# Patient Record
Sex: Female | Born: 1935 | Race: White | Hispanic: No | Marital: Married | Smoking: Never smoker
Health system: Southern US, Community
[De-identification: ages and names within clinical notes are randomized; demographics above are authoritative.]

## PROBLEM LIST (undated history)

## (undated) DIAGNOSIS — J449 Chronic obstructive pulmonary disease, unspecified: Secondary | ICD-10-CM

## (undated) DIAGNOSIS — E039 Hypothyroidism, unspecified: Secondary | ICD-10-CM

## (undated) DIAGNOSIS — F419 Anxiety disorder, unspecified: Secondary | ICD-10-CM

## (undated) DIAGNOSIS — R42 Dizziness and giddiness: Secondary | ICD-10-CM

## (undated) DIAGNOSIS — J189 Pneumonia, unspecified organism: Secondary | ICD-10-CM

## (undated) DIAGNOSIS — I2510 Atherosclerotic heart disease of native coronary artery without angina pectoris: Secondary | ICD-10-CM

## (undated) DIAGNOSIS — R7303 Prediabetes: Secondary | ICD-10-CM

## (undated) DIAGNOSIS — N289 Disorder of kidney and ureter, unspecified: Secondary | ICD-10-CM

## (undated) DIAGNOSIS — Z940 Kidney transplant status: Secondary | ICD-10-CM

## (undated) DIAGNOSIS — K219 Gastro-esophageal reflux disease without esophagitis: Secondary | ICD-10-CM

## (undated) DIAGNOSIS — R269 Unspecified abnormalities of gait and mobility: Secondary | ICD-10-CM

## (undated) DIAGNOSIS — F329 Major depressive disorder, single episode, unspecified: Secondary | ICD-10-CM

## (undated) DIAGNOSIS — I82409 Acute embolism and thrombosis of unspecified deep veins of unspecified lower extremity: Secondary | ICD-10-CM

## (undated) DIAGNOSIS — K5792 Diverticulitis of intestine, part unspecified, without perforation or abscess without bleeding: Secondary | ICD-10-CM

## (undated) DIAGNOSIS — J45909 Unspecified asthma, uncomplicated: Secondary | ICD-10-CM

## (undated) DIAGNOSIS — IMO0001 Reserved for inherently not codable concepts without codable children: Secondary | ICD-10-CM

## (undated) DIAGNOSIS — Z889 Allergy status to unspecified drugs, medicaments and biological substances status: Secondary | ICD-10-CM

## (undated) DIAGNOSIS — F32A Depression, unspecified: Secondary | ICD-10-CM

## (undated) DIAGNOSIS — E079 Disorder of thyroid, unspecified: Secondary | ICD-10-CM

## (undated) DIAGNOSIS — M1990 Unspecified osteoarthritis, unspecified site: Secondary | ICD-10-CM

## (undated) DIAGNOSIS — I10 Essential (primary) hypertension: Secondary | ICD-10-CM

## (undated) HISTORY — DX: Gastro-esophageal reflux disease without esophagitis: K21.9

## (undated) HISTORY — DX: Hypothyroidism, unspecified: E03.9

## (undated) HISTORY — DX: Dizziness and giddiness: R42

## (undated) HISTORY — PX: NEPHRECTOMY TRANSPLANTED ORGAN: SUR880

## (undated) HISTORY — DX: Depression, unspecified: F32.A

## (undated) HISTORY — DX: Reserved for inherently not codable concepts without codable children: IMO0001

## (undated) HISTORY — DX: Diverticulitis of intestine, part unspecified, without perforation or abscess without bleeding: K57.92

## (undated) HISTORY — DX: Anxiety disorder, unspecified: F41.9

## (undated) HISTORY — PX: COLONOSCOPY: SHX174

## (undated) HISTORY — DX: Major depressive disorder, single episode, unspecified: F32.9

## (undated) HISTORY — DX: Unspecified abnormalities of gait and mobility: R26.9

## (undated) HISTORY — PX: TUMOR REMOVAL: SHX12

## (undated) HISTORY — DX: Chronic obstructive pulmonary disease, unspecified: J44.9

---

## 1998-03-10 ENCOUNTER — Encounter: Admission: RE | Admit: 1998-03-10 | Discharge: 1998-06-08 | Payer: Self-pay | Admitting: *Deleted

## 1998-09-21 ENCOUNTER — Ambulatory Visit (HOSPITAL_COMMUNITY): Admission: RE | Admit: 1998-09-21 | Discharge: 1998-09-21 | Payer: Self-pay | Admitting: Gastroenterology

## 1998-09-21 ENCOUNTER — Encounter: Payer: Self-pay | Admitting: Gastroenterology

## 1999-02-20 ENCOUNTER — Other Ambulatory Visit: Admission: RE | Admit: 1999-02-20 | Discharge: 1999-02-20 | Payer: Self-pay | Admitting: Obstetrics and Gynecology

## 2000-01-23 ENCOUNTER — Ambulatory Visit (HOSPITAL_COMMUNITY): Admission: RE | Admit: 2000-01-23 | Discharge: 2000-01-23 | Payer: Self-pay | Admitting: Obstetrics and Gynecology

## 2000-01-23 ENCOUNTER — Encounter (INDEPENDENT_AMBULATORY_CARE_PROVIDER_SITE_OTHER): Payer: Self-pay | Admitting: Specialist

## 2000-02-27 ENCOUNTER — Encounter: Admission: RE | Admit: 2000-02-27 | Discharge: 2000-02-27 | Payer: Self-pay | Admitting: Geriatric Medicine

## 2000-02-27 ENCOUNTER — Encounter: Payer: Self-pay | Admitting: Geriatric Medicine

## 2000-07-10 ENCOUNTER — Other Ambulatory Visit: Admission: RE | Admit: 2000-07-10 | Discharge: 2000-07-10 | Payer: Self-pay | Admitting: Obstetrics and Gynecology

## 2000-07-24 ENCOUNTER — Encounter: Payer: Self-pay | Admitting: Obstetrics and Gynecology

## 2000-07-24 ENCOUNTER — Encounter: Admission: RE | Admit: 2000-07-24 | Discharge: 2000-07-24 | Payer: Self-pay | Admitting: Obstetrics and Gynecology

## 2000-11-29 ENCOUNTER — Ambulatory Visit: Admission: RE | Admit: 2000-11-29 | Discharge: 2000-11-29 | Payer: Self-pay | Admitting: Neurosurgery

## 2000-11-29 ENCOUNTER — Encounter: Payer: Self-pay | Admitting: Neurosurgery

## 2000-12-18 ENCOUNTER — Encounter: Payer: Self-pay | Admitting: Neurosurgery

## 2000-12-20 ENCOUNTER — Inpatient Hospital Stay (HOSPITAL_COMMUNITY): Admission: RE | Admit: 2000-12-20 | Discharge: 2000-12-26 | Payer: Self-pay | Admitting: Neurosurgery

## 2000-12-20 ENCOUNTER — Encounter (INDEPENDENT_AMBULATORY_CARE_PROVIDER_SITE_OTHER): Payer: Self-pay | Admitting: *Deleted

## 2000-12-24 ENCOUNTER — Encounter: Payer: Self-pay | Admitting: Neurosurgery

## 2000-12-30 ENCOUNTER — Encounter: Admission: RE | Admit: 2000-12-30 | Discharge: 2001-03-30 | Payer: Self-pay | Admitting: Neurosurgery

## 2001-01-24 ENCOUNTER — Ambulatory Visit (HOSPITAL_COMMUNITY): Admission: RE | Admit: 2001-01-24 | Discharge: 2001-01-24 | Payer: Self-pay | Admitting: Neurosurgery

## 2001-01-24 ENCOUNTER — Encounter: Payer: Self-pay | Admitting: Neurosurgery

## 2001-03-07 ENCOUNTER — Other Ambulatory Visit: Admission: RE | Admit: 2001-03-07 | Discharge: 2001-03-07 | Payer: Self-pay | Admitting: Obstetrics and Gynecology

## 2001-04-01 ENCOUNTER — Encounter: Payer: Self-pay | Admitting: Geriatric Medicine

## 2001-04-01 ENCOUNTER — Encounter: Admission: RE | Admit: 2001-04-01 | Discharge: 2001-04-01 | Payer: Self-pay | Admitting: Geriatric Medicine

## 2001-05-05 ENCOUNTER — Encounter: Admission: RE | Admit: 2001-05-05 | Discharge: 2001-05-05 | Payer: Self-pay | Admitting: Neurosurgery

## 2001-05-05 ENCOUNTER — Encounter: Payer: Self-pay | Admitting: Neurosurgery

## 2001-06-19 ENCOUNTER — Encounter
Admission: RE | Admit: 2001-06-19 | Discharge: 2001-09-17 | Payer: Self-pay | Admitting: Physical Medicine and Rehabilitation

## 2001-08-21 ENCOUNTER — Encounter: Admission: RE | Admit: 2001-08-21 | Discharge: 2001-08-21 | Payer: Self-pay | Admitting: Obstetrics and Gynecology

## 2001-08-21 ENCOUNTER — Encounter: Payer: Self-pay | Admitting: Obstetrics and Gynecology

## 2001-08-24 ENCOUNTER — Ambulatory Visit (HOSPITAL_COMMUNITY): Admission: RE | Admit: 2001-08-24 | Discharge: 2001-08-24 | Payer: Self-pay | Admitting: Neurosurgery

## 2001-08-24 ENCOUNTER — Encounter: Payer: Self-pay | Admitting: Neurosurgery

## 2001-09-30 ENCOUNTER — Encounter
Admission: RE | Admit: 2001-09-30 | Discharge: 2001-10-02 | Payer: Self-pay | Admitting: Physical Medicine and Rehabilitation

## 2002-05-05 ENCOUNTER — Other Ambulatory Visit: Admission: RE | Admit: 2002-05-05 | Discharge: 2002-05-05 | Payer: Self-pay | Admitting: Gynecology

## 2002-08-21 ENCOUNTER — Encounter: Admission: RE | Admit: 2002-08-21 | Discharge: 2002-08-21 | Payer: Self-pay | Admitting: Gynecology

## 2002-08-21 ENCOUNTER — Encounter: Payer: Self-pay | Admitting: Gynecology

## 2002-08-24 ENCOUNTER — Encounter: Payer: Self-pay | Admitting: Neurosurgery

## 2002-08-24 ENCOUNTER — Ambulatory Visit (HOSPITAL_COMMUNITY): Admission: RE | Admit: 2002-08-24 | Discharge: 2002-08-24 | Payer: Self-pay | Admitting: Neurosurgery

## 2002-08-24 ENCOUNTER — Ambulatory Visit (HOSPITAL_BASED_OUTPATIENT_CLINIC_OR_DEPARTMENT_OTHER): Admission: RE | Admit: 2002-08-24 | Discharge: 2002-08-24 | Payer: Self-pay | Admitting: Gynecology

## 2002-09-08 ENCOUNTER — Encounter: Admission: RE | Admit: 2002-09-08 | Discharge: 2002-09-08 | Payer: Self-pay | Admitting: Gynecology

## 2002-09-08 ENCOUNTER — Encounter: Payer: Self-pay | Admitting: Gynecology

## 2002-09-24 ENCOUNTER — Encounter: Admission: RE | Admit: 2002-09-24 | Discharge: 2002-09-24 | Payer: Self-pay | Admitting: Gynecology

## 2002-09-24 ENCOUNTER — Encounter: Payer: Self-pay | Admitting: Gynecology

## 2002-09-25 ENCOUNTER — Encounter: Payer: Self-pay | Admitting: Neurosurgery

## 2002-09-25 ENCOUNTER — Ambulatory Visit (HOSPITAL_COMMUNITY): Admission: RE | Admit: 2002-09-25 | Discharge: 2002-09-25 | Payer: Self-pay | Admitting: Gynecology

## 2003-05-11 ENCOUNTER — Other Ambulatory Visit: Admission: RE | Admit: 2003-05-11 | Discharge: 2003-05-11 | Payer: Self-pay | Admitting: Gynecology

## 2003-09-27 ENCOUNTER — Encounter: Admission: RE | Admit: 2003-09-27 | Discharge: 2003-09-27 | Payer: Self-pay | Admitting: Gastroenterology

## 2003-10-25 ENCOUNTER — Encounter (INDEPENDENT_AMBULATORY_CARE_PROVIDER_SITE_OTHER): Payer: Self-pay | Admitting: *Deleted

## 2003-10-25 ENCOUNTER — Inpatient Hospital Stay (HOSPITAL_COMMUNITY): Admission: AD | Admit: 2003-10-25 | Discharge: 2003-11-30 | Payer: Self-pay | Admitting: Endocrinology

## 2003-12-16 ENCOUNTER — Encounter: Admission: RE | Admit: 2003-12-16 | Discharge: 2003-12-16 | Payer: Self-pay | Admitting: Nephrology

## 2004-01-11 ENCOUNTER — Inpatient Hospital Stay (HOSPITAL_COMMUNITY): Admission: EM | Admit: 2004-01-11 | Discharge: 2004-01-13 | Payer: Self-pay | Admitting: Emergency Medicine

## 2004-04-13 ENCOUNTER — Ambulatory Visit (HOSPITAL_COMMUNITY): Admission: RE | Admit: 2004-04-13 | Discharge: 2004-04-13 | Payer: Self-pay | Admitting: *Deleted

## 2004-04-20 ENCOUNTER — Encounter: Payer: Self-pay | Admitting: Vascular Surgery

## 2004-04-20 ENCOUNTER — Ambulatory Visit (HOSPITAL_COMMUNITY): Admission: RE | Admit: 2004-04-20 | Discharge: 2004-04-20 | Payer: Self-pay | Admitting: *Deleted

## 2004-06-23 ENCOUNTER — Ambulatory Visit (HOSPITAL_COMMUNITY): Admission: RE | Admit: 2004-06-23 | Discharge: 2004-06-23 | Payer: Self-pay | Admitting: Vascular Surgery

## 2004-07-06 ENCOUNTER — Ambulatory Visit (HOSPITAL_COMMUNITY): Admission: RE | Admit: 2004-07-06 | Discharge: 2004-07-06 | Payer: Self-pay | Admitting: Vascular Surgery

## 2004-08-14 ENCOUNTER — Ambulatory Visit (HOSPITAL_COMMUNITY): Admission: RE | Admit: 2004-08-14 | Discharge: 2004-08-14 | Payer: Self-pay | Admitting: Vascular Surgery

## 2004-08-30 ENCOUNTER — Other Ambulatory Visit: Admission: RE | Admit: 2004-08-30 | Discharge: 2004-08-30 | Payer: Self-pay | Admitting: Gynecology

## 2004-09-14 ENCOUNTER — Ambulatory Visit (HOSPITAL_COMMUNITY): Admission: RE | Admit: 2004-09-14 | Discharge: 2004-09-14 | Payer: Self-pay | Admitting: Gynecology

## 2004-09-28 ENCOUNTER — Ambulatory Visit (HOSPITAL_COMMUNITY): Admission: RE | Admit: 2004-09-28 | Discharge: 2004-09-28 | Payer: Self-pay | Admitting: Gastroenterology

## 2004-09-28 ENCOUNTER — Encounter (INDEPENDENT_AMBULATORY_CARE_PROVIDER_SITE_OTHER): Payer: Self-pay | Admitting: *Deleted

## 2004-12-20 ENCOUNTER — Other Ambulatory Visit: Admission: RE | Admit: 2004-12-20 | Discharge: 2004-12-20 | Payer: Self-pay | Admitting: Obstetrics and Gynecology

## 2005-01-15 ENCOUNTER — Encounter (INDEPENDENT_AMBULATORY_CARE_PROVIDER_SITE_OTHER): Payer: Self-pay | Admitting: Specialist

## 2005-01-15 ENCOUNTER — Ambulatory Visit (HOSPITAL_COMMUNITY): Admission: RE | Admit: 2005-01-15 | Discharge: 2005-01-15 | Payer: Self-pay | Admitting: Obstetrics and Gynecology

## 2005-05-15 ENCOUNTER — Encounter: Admission: RE | Admit: 2005-05-15 | Discharge: 2005-05-15 | Payer: Self-pay | Admitting: Gastroenterology

## 2005-05-15 ENCOUNTER — Emergency Department (HOSPITAL_COMMUNITY): Admission: EM | Admit: 2005-05-15 | Discharge: 2005-05-15 | Payer: Self-pay | Admitting: Emergency Medicine

## 2005-05-17 ENCOUNTER — Ambulatory Visit (HOSPITAL_COMMUNITY): Admission: RE | Admit: 2005-05-17 | Discharge: 2005-05-17 | Payer: Self-pay | Admitting: Family Medicine

## 2005-06-07 ENCOUNTER — Encounter: Admission: RE | Admit: 2005-06-07 | Discharge: 2005-06-27 | Payer: Self-pay | Admitting: Specialist

## 2005-06-28 ENCOUNTER — Encounter: Admission: RE | Admit: 2005-06-28 | Discharge: 2005-08-06 | Payer: Self-pay | Admitting: Specialist

## 2005-08-07 ENCOUNTER — Encounter: Admission: RE | Admit: 2005-08-07 | Discharge: 2005-09-10 | Payer: Self-pay | Admitting: Specialist

## 2005-09-11 ENCOUNTER — Encounter: Admission: RE | Admit: 2005-09-11 | Discharge: 2005-12-10 | Payer: Self-pay | Admitting: Specialist

## 2005-10-09 ENCOUNTER — Ambulatory Visit (HOSPITAL_COMMUNITY): Admission: RE | Admit: 2005-10-09 | Discharge: 2005-10-09 | Payer: Self-pay | Admitting: Nephrology

## 2005-11-21 ENCOUNTER — Ambulatory Visit (HOSPITAL_COMMUNITY): Admission: RE | Admit: 2005-11-21 | Discharge: 2005-11-21 | Payer: Self-pay | Admitting: Vascular Surgery

## 2006-02-05 ENCOUNTER — Other Ambulatory Visit: Admission: RE | Admit: 2006-02-05 | Discharge: 2006-02-05 | Payer: Self-pay | Admitting: Obstetrics and Gynecology

## 2006-02-21 ENCOUNTER — Ambulatory Visit (HOSPITAL_COMMUNITY): Admission: RE | Admit: 2006-02-21 | Discharge: 2006-02-21 | Payer: Self-pay | Admitting: Nephrology

## 2006-02-26 ENCOUNTER — Ambulatory Visit (HOSPITAL_COMMUNITY): Admission: RE | Admit: 2006-02-26 | Discharge: 2006-02-26 | Payer: Self-pay | Admitting: Obstetrics and Gynecology

## 2006-03-18 ENCOUNTER — Ambulatory Visit: Payer: Self-pay | Admitting: Pulmonary Disease

## 2006-04-02 ENCOUNTER — Encounter: Admission: RE | Admit: 2006-04-02 | Discharge: 2006-04-02 | Payer: Self-pay | Admitting: Nephrology

## 2006-05-23 ENCOUNTER — Inpatient Hospital Stay (HOSPITAL_COMMUNITY): Admission: EM | Admit: 2006-05-23 | Discharge: 2006-05-24 | Payer: Self-pay | Admitting: Emergency Medicine

## 2006-05-27 ENCOUNTER — Emergency Department (HOSPITAL_COMMUNITY): Admission: EM | Admit: 2006-05-27 | Discharge: 2006-05-27 | Payer: Self-pay | Admitting: Emergency Medicine

## 2006-10-22 ENCOUNTER — Ambulatory Visit (HOSPITAL_COMMUNITY): Admission: RE | Admit: 2006-10-22 | Discharge: 2006-10-22 | Payer: Self-pay | Admitting: Nephrology

## 2007-01-19 ENCOUNTER — Emergency Department (HOSPITAL_COMMUNITY): Admission: EM | Admit: 2007-01-19 | Discharge: 2007-01-19 | Payer: Self-pay | Admitting: Emergency Medicine

## 2007-01-22 ENCOUNTER — Ambulatory Visit: Payer: Self-pay | Admitting: Vascular Surgery

## 2007-01-22 ENCOUNTER — Ambulatory Visit (HOSPITAL_COMMUNITY): Admission: RE | Admit: 2007-01-22 | Discharge: 2007-01-22 | Payer: Self-pay | Admitting: Vascular Surgery

## 2007-02-18 ENCOUNTER — Other Ambulatory Visit: Admission: RE | Admit: 2007-02-18 | Discharge: 2007-02-18 | Payer: Self-pay | Admitting: Obstetrics and Gynecology

## 2007-02-19 ENCOUNTER — Ambulatory Visit (HOSPITAL_COMMUNITY): Admission: RE | Admit: 2007-02-19 | Discharge: 2007-02-19 | Payer: Self-pay | Admitting: Vascular Surgery

## 2007-02-20 ENCOUNTER — Ambulatory Visit (HOSPITAL_COMMUNITY): Admission: RE | Admit: 2007-02-20 | Discharge: 2007-02-20 | Payer: Self-pay | Admitting: Vascular Surgery

## 2007-03-11 ENCOUNTER — Ambulatory Visit (HOSPITAL_COMMUNITY): Admission: RE | Admit: 2007-03-11 | Discharge: 2007-03-11 | Payer: Self-pay | Admitting: Nephrology

## 2007-04-15 ENCOUNTER — Encounter: Admission: RE | Admit: 2007-04-15 | Discharge: 2007-04-15 | Payer: Self-pay | Admitting: Gastroenterology

## 2007-04-23 ENCOUNTER — Ambulatory Visit: Payer: Self-pay | Admitting: Vascular Surgery

## 2007-05-06 ENCOUNTER — Ambulatory Visit: Payer: Self-pay | Admitting: Vascular Surgery

## 2007-05-06 ENCOUNTER — Ambulatory Visit (HOSPITAL_COMMUNITY): Admission: RE | Admit: 2007-05-06 | Discharge: 2007-05-06 | Payer: Self-pay | Admitting: Vascular Surgery

## 2007-05-14 ENCOUNTER — Ambulatory Visit: Payer: Self-pay | Admitting: Vascular Surgery

## 2007-06-17 ENCOUNTER — Ambulatory Visit (HOSPITAL_COMMUNITY): Admission: RE | Admit: 2007-06-17 | Discharge: 2007-06-17 | Payer: Self-pay | Admitting: Obstetrics and Gynecology

## 2007-06-17 ENCOUNTER — Encounter (INDEPENDENT_AMBULATORY_CARE_PROVIDER_SITE_OTHER): Payer: Self-pay | Admitting: Obstetrics and Gynecology

## 2007-06-26 ENCOUNTER — Ambulatory Visit: Payer: Self-pay | Admitting: Surgery

## 2007-06-26 ENCOUNTER — Ambulatory Visit (HOSPITAL_COMMUNITY): Admission: RE | Admit: 2007-06-26 | Discharge: 2007-06-26 | Payer: Self-pay | Admitting: Vascular Surgery

## 2007-06-28 ENCOUNTER — Ambulatory Visit (HOSPITAL_COMMUNITY): Admission: RE | Admit: 2007-06-28 | Discharge: 2007-06-28 | Payer: Self-pay | Admitting: Diagnostic Radiology

## 2007-08-28 ENCOUNTER — Encounter: Admission: RE | Admit: 2007-08-28 | Discharge: 2007-08-28 | Payer: Self-pay | Admitting: Nephrology

## 2007-09-18 ENCOUNTER — Encounter: Admission: RE | Admit: 2007-09-18 | Discharge: 2007-09-18 | Payer: Self-pay | Admitting: Obstetrics and Gynecology

## 2008-01-20 ENCOUNTER — Ambulatory Visit (HOSPITAL_COMMUNITY): Admission: RE | Admit: 2008-01-20 | Discharge: 2008-01-20 | Payer: Self-pay | Admitting: Gastroenterology

## 2008-08-17 ENCOUNTER — Other Ambulatory Visit: Admission: RE | Admit: 2008-08-17 | Discharge: 2008-08-17 | Payer: Self-pay | Admitting: Obstetrics and Gynecology

## 2008-10-27 ENCOUNTER — Ambulatory Visit (HOSPITAL_COMMUNITY): Admission: RE | Admit: 2008-10-27 | Discharge: 2008-10-27 | Payer: Self-pay | Admitting: Nephrology

## 2008-12-02 ENCOUNTER — Encounter: Admission: RE | Admit: 2008-12-02 | Discharge: 2008-12-02 | Payer: Self-pay | Admitting: Obstetrics and Gynecology

## 2009-06-03 ENCOUNTER — Ambulatory Visit (HOSPITAL_COMMUNITY): Admission: RE | Admit: 2009-06-03 | Discharge: 2009-06-03 | Payer: Self-pay | Admitting: Nephrology

## 2009-12-26 ENCOUNTER — Encounter
Admission: RE | Admit: 2009-12-26 | Discharge: 2010-02-14 | Payer: Self-pay | Admitting: Physical Medicine and Rehabilitation

## 2010-08-26 ENCOUNTER — Inpatient Hospital Stay (HOSPITAL_COMMUNITY): Admission: EM | Admit: 2010-08-26 | Discharge: 2010-08-29 | Payer: Self-pay | Admitting: Nephrology

## 2010-08-27 ENCOUNTER — Ambulatory Visit: Payer: Self-pay | Admitting: Infectious Disease

## 2010-11-04 ENCOUNTER — Encounter: Payer: Self-pay | Admitting: Gynecology

## 2010-11-04 ENCOUNTER — Encounter: Payer: Self-pay | Admitting: Geriatric Medicine

## 2010-11-05 ENCOUNTER — Encounter: Payer: Self-pay | Admitting: Obstetrics and Gynecology

## 2010-11-05 ENCOUNTER — Encounter: Payer: Self-pay | Admitting: Nephrology

## 2010-11-05 ENCOUNTER — Encounter: Payer: Self-pay | Admitting: Gastroenterology

## 2010-11-06 ENCOUNTER — Encounter: Payer: Self-pay | Admitting: Nephrology

## 2010-12-26 LAB — BASIC METABOLIC PANEL
BUN: 15 mg/dL (ref 6–23)
CO2: 22 mEq/L (ref 19–32)
CO2: 23 mEq/L (ref 19–32)
CO2: 24 mEq/L (ref 19–32)
Calcium: 9.3 mg/dL (ref 8.4–10.5)
Chloride: 114 mEq/L — ABNORMAL HIGH (ref 96–112)
Chloride: 114 mEq/L — ABNORMAL HIGH (ref 96–112)
Creatinine, Ser: 1.11 mg/dL (ref 0.4–1.2)
GFR calc Af Amer: 58 mL/min — ABNORMAL LOW (ref >60)
GFR calc Af Amer: >60 mL/min (ref >60)
Potassium: 3.6 mEq/L (ref 3.5–5.1)
Potassium: 4.2 mEq/L (ref 3.5–5.1)
Sodium: 142 mEq/L (ref 135–145)
Sodium: 144 mEq/L (ref 135–145)

## 2010-12-26 LAB — CBC
HCT: 35.3 % — ABNORMAL LOW (ref 36.0–46.0)
HCT: 39.3 % (ref 36.0–46.0)
Hemoglobin: 11.8 g/dL — ABNORMAL LOW (ref 12.0–15.0)
Hemoglobin: 12.1 g/dL (ref 12.0–15.0)
Hemoglobin: 13.9 g/dL (ref 12.0–15.0)
MCH: 31.1 pg (ref 26.0–34.0)
MCH: 31.1 pg (ref 26.0–34.0)
MCH: 31.3 pg (ref 26.0–34.0)
MCH: 31.4 pg (ref 26.0–34.0)
MCV: 92.5 fL (ref 78.0–100.0)
MCV: 92.7 fL (ref 78.0–100.0)
MCV: 93.6 fL (ref 78.0–100.0)
Platelets: 204 10*3/uL (ref 150–400)
RBC: 3.77 MIL/uL — ABNORMAL LOW (ref 3.87–5.11)
RBC: 3.89 MIL/uL (ref 3.87–5.11)
RBC: 4.42 MIL/uL (ref 3.87–5.11)
RDW: 13.6 % (ref 11.5–15.5)
WBC: 3 10*3/uL — ABNORMAL LOW (ref 4.0–10.5)

## 2010-12-26 LAB — COMPREHENSIVE METABOLIC PANEL
ALT: 20 U/L (ref 0–35)
AST: 32 U/L (ref 0–37)
Alkaline Phosphatase: 97 U/L (ref 39–117)
CO2: 25 mEq/L (ref 19–32)
Chloride: 104 mEq/L (ref 96–112)
Creatinine, Ser: 1.56 mg/dL — ABNORMAL HIGH (ref 0.4–1.2)
GFR calc Af Amer: 39 mL/min — ABNORMAL LOW (ref >60)
GFR calc non Af Amer: 32 mL/min — ABNORMAL LOW (ref >60)
Potassium: 3.8 mEq/L (ref 3.5–5.1)
Total Bilirubin: 0.5 mg/dL (ref 0.3–1.2)

## 2010-12-26 LAB — URINALYSIS, ROUTINE W REFLEX MICROSCOPIC
Bilirubin Urine: NEGATIVE
Ketones, ur: NEGATIVE mg/dL
Nitrite: NEGATIVE
Urobilinogen, UA: 0.2 mg/dL (ref 0.0–1.0)
pH: 6.5 (ref 5.0–8.0)

## 2010-12-26 LAB — VARICELLA-ZOSTER BY PCR: Varicella-Zoster, PCR: DETECTED — AB

## 2010-12-26 LAB — URINE MICROSCOPIC-ADD ON

## 2010-12-26 LAB — APTT: aPTT: 28 seconds (ref 24–37)

## 2011-01-18 LAB — BASIC METABOLIC PANEL
CO2: 27 mEq/L (ref 19–32)
Calcium: 9.9 mg/dL (ref 8.4–10.5)
Creatinine, Ser: 1.56 mg/dL — ABNORMAL HIGH (ref 0.4–1.2)
GFR calc Af Amer: 39 mL/min — ABNORMAL LOW (ref >60)
GFR calc non Af Amer: 32 mL/min — ABNORMAL LOW (ref >60)
Glucose, Bld: 144 mg/dL — ABNORMAL HIGH (ref 70–99)
Sodium: 142 mEq/L (ref 135–145)

## 2011-01-18 LAB — CBC
HCT: 38.8 % (ref 36.0–46.0)
MCH: 31.3 pg (ref 26.0–34.0)
MCHC: 33.2 g/dL (ref 30.0–36.0)
RDW: 13.7 % (ref 11.5–15.5)

## 2011-01-22 ENCOUNTER — Ambulatory Visit (HOSPITAL_COMMUNITY): Payer: Medicare Other | Attending: Specialist

## 2011-01-22 ENCOUNTER — Ambulatory Visit (HOSPITAL_BASED_OUTPATIENT_CLINIC_OR_DEPARTMENT_OTHER)
Admission: RE | Admit: 2011-01-22 | Discharge: 2011-01-22 | Disposition: A | Discharge status: Home / Self Care | Payer: Medicare Other | Source: Ambulatory Visit | Attending: Specialist | Admitting: Specialist

## 2011-01-22 DIAGNOSIS — Z01818 Encounter for other preprocedural examination: Secondary | ICD-10-CM | POA: Insufficient documentation

## 2011-01-22 DIAGNOSIS — Z79899 Other long term (current) drug therapy: Secondary | ICD-10-CM | POA: Insufficient documentation

## 2011-01-22 DIAGNOSIS — J984 Other disorders of lung: Secondary | ICD-10-CM | POA: Insufficient documentation

## 2011-01-22 DIAGNOSIS — S83289A Other tear of lateral meniscus, current injury, unspecified knee, initial encounter: Secondary | ICD-10-CM | POA: Insufficient documentation

## 2011-01-22 DIAGNOSIS — N186 End stage renal disease: Secondary | ICD-10-CM | POA: Insufficient documentation

## 2011-01-22 DIAGNOSIS — E785 Hyperlipidemia, unspecified: Secondary | ICD-10-CM | POA: Insufficient documentation

## 2011-01-22 DIAGNOSIS — I120 Hypertensive chronic kidney disease with stage 5 chronic kidney disease or end stage renal disease: Secondary | ICD-10-CM | POA: Insufficient documentation

## 2011-01-22 DIAGNOSIS — E039 Hypothyroidism, unspecified: Secondary | ICD-10-CM | POA: Insufficient documentation

## 2011-01-22 DIAGNOSIS — X58XXXA Exposure to other specified factors, initial encounter: Secondary | ICD-10-CM | POA: Insufficient documentation

## 2011-01-22 DIAGNOSIS — Z8673 Personal history of transient ischemic attack (TIA), and cerebral infarction without residual deficits: Secondary | ICD-10-CM | POA: Insufficient documentation

## 2011-01-22 DIAGNOSIS — IMO0002 Reserved for concepts with insufficient information to code with codable children: Secondary | ICD-10-CM | POA: Insufficient documentation

## 2011-01-22 DIAGNOSIS — M2390 Unspecified internal derangement of unspecified knee: Secondary | ICD-10-CM | POA: Insufficient documentation

## 2011-01-22 DIAGNOSIS — Z940 Kidney transplant status: Secondary | ICD-10-CM | POA: Insufficient documentation

## 2011-01-29 LAB — GLUCOSE, CAPILLARY: Glucose-Capillary: 86 mg/dL (ref 70–99)

## 2011-01-31 NOTE — Op Note (Signed)
  [name], Patricia [name]               ACCOUNT NO.:  [id number]  MEDICAL RECORD NO.:  [id number]           PATIENT TYPE:  LOCATION:                                 FACILITY:  PHYSICIAN:  Susa Day, M.D.    DATE OF BIRTH:  [birth year]  DATE OF PROCEDURE:  01/22/2011 DATE OF DISCHARGE:                              OPERATIVE REPORT   PREOPERATIVE DIAGNOSIS:  Medial and lateral meniscus tears of the right knee.  POSTOPERATIVE DIAGNOSES:  Medial and lateral meniscus tears of the right knee, large grade 4 lesion in the medial femoral condyle.  PROCEDURE PERFORMED: 1. Right knee arthroscopy. 2. Partial medial and lateral meniscectomies. 3. Chondroplasty of the medial femoral condyle, medial tibial plateau. 4. Chondroplasty of patellofemoral joint.  HISTORY:  A 77 year old with knee pain, medial and lateral meniscus tears, DJD, history of renal transplant, DVT, indicated for debridement and meniscectomies.  Risks and benefits discussed, including bleeding, infection, damage to neurovascular structures, no change in symptoms, worsening in symptoms, need for repeat debridement, DVT, PE, anesthetic complications, need for total knee.  TECHNIQUE:  With the patient in supine position after induction of adequate general anesthesia, 1 g of Kefzol and 100 mg of doxycycline, right lower extremity was prepped and draped in usual sterile fashion. A lateral parapatellar portal was fashioned with a #11 blade.  Ingress cannula atraumatically placed.  Irrigant was utilized to insufflate the joint.  Under direct visualization, a medial parapatellar portal was fashioned with a #11 blade after localization with an 18-gauge needle sparing the medial meniscus.  Noted was an extensive tearing of medial meniscus, extensive grade 3 changes of the femoral condyle, and a large grade 4 lesion measuring 2 x 2 cm.  I performed a light chondroplasty of the femoral condyle.  Basket rongeur utilized to resect the  medial meniscus to a stable base approximately one half of the posterior third was resected.  This was further contoured with a 3.5 Cuda shaver, the remnant stable to probe palpation.  ACL was unremarkable.  Lateral compartment of the tear in the posterior horn on the lateral meniscus, unstable.  This was resected with a straight basket rongeur and further contoured with a 3.5 Cuda shaver to a stable base.  There were minor grade 2 changes in this compartment.  The suprapatellar pouch revealed some minor grade 3 changes of patellofemoral joint line, light chondroplasty was performed in the patella, normal patellofemoral tracking.  Gutters were unremarkable.  We copiously lavaged the joint.  Reexamined the medial compartment, no further pathology amenable arthroscopic intervention.  With this large grade 4 lesion, if she does not do well following the knee arthroscopy, she will require a total knee replacement.  The patient tolerated the procedure well.  No complications.  Minimal blood loss.  No assistant.     Susa Day, M.D.     Geralynn Rile  D:  01/22/2011  T:  01/22/2011  Job:  [id number]  Electronically Signed by Susa Day M.D. on 01/31/2011 12:15:28 PM

## 2011-02-27 NOTE — Assessment & Plan Note (Signed)
OFFICE VISIT   Patricia [name], Patricia [name]  DOB:  1932/10/12                                       05/14/2007  [id number]   [name] had a redo left upper arm loop graft on May 06, 2007. She has  some swelling at the distal incision and wanted this checked. She has  had no fever or chills. On examination, there is a hematoma down by the  distal incision but there is no erythema or drainage and I think this  will resolve on it's own. I have instructed her that she can elevate her  arm and use warm compresses if this helps her symptoms. I think this  will resolve without any problems. She knows to call sooner if it does  not.   Judeth Cornfield. Scot Dock, M.D.  Electronically Signed   CSD/MEDQ  D:  05/14/2007  T:  05/15/2007  Job:  [id number]

## 2011-02-27 NOTE — Op Note (Signed)
Patricia [name], Patricia [name]               ACCOUNT NO.:  [id number]   MEDICAL RECORD NO.:  [id number]          PATIENT TYPE:  AMB   LOCATION:  SDS                          FACILITY:  [facility]   PHYSICIAN:  Judeth Cornfield. Scot Dock, M.D.DATE OF BIRTH:  [birth year]   DATE OF PROCEDURE:  05/06/2007  DATE OF DISCHARGE:                               OPERATIVE REPORT   PREOPERATIVE DIAGNOSIS:  Chronic renal failure.   POSTOPERATIVE DIAGNOSIS:  Chronic renal failure.   PROCEDURE:  Redo new left upper arm loop AV graft.   SURGEON:  Scot Dock.   ASSISTANT:  Luther Bradley, P.A.   ANESTHESIA:  Is local with sedation.   TECHNIQUE:  The patient was taken to the operating room and sedated by  anesthesia.  The left upper extremity was prepped and draped in the  usual sterile fashion.  After the skin was infiltrated with 1%  lidocaine, a longitudinal incision was made beneath the axilla where I  was able to dissect the high brachial vein free, and it was a good-sized  vein.  Separate oblique incision was made over the brachial artery just  above the antecubital level.  Here, the artery was quite small.  Upon  interrogation with Doppler, it was clear that the patient had a high  bifurcation of the brachial artery.  For this reason, I elected to place  an upper arm loop graft.  Through the axillary incision, the high  brachial artery was dissected free and was much larger.  A 4-7 mm graft  was then tunneled in a loop fashion in the upper arm with the arterial  aspect of the graft along the lateral aspect of the upper arm.  The  patient was then heparinized.  The brachial artery was clamped  proximally and distally, and a longitudinal arteriotomy was made.  A  segment of the 4 mm and the graft was excised.  The graft spatulated and  sewn end-to-side to the artery using continuous 6-0 Prolene suture.  The  graft was then pulled to the appropriate length for anastomosis to the  axillary vein.  The vein was  ligated distally and spatulated proximally.  It was cut to the appropriate length, sewn end-to-end to the vein using  continuous 6-0 Prolene suture.  At the completion, there was an  excellent thrill in the graft and a good radial and ulnar signal with  the Doppler.  Hemostasis was obtained in the wound.  The wound was  closed with a deep layer of 3-0 Vicryl, the skin closed with 4-0 Vicryl.  Sterile dressing was applied.  The patient tolerated the procedure well  and was transferred to the recovery room in satisfactory condition.  All  needle and sponge counts were correct.      Judeth Cornfield. Scot Dock, M.D.  Electronically Signed     CSD/MEDQ  D:  05/06/2007  T:  05/06/2007  Job:  [id number]

## 2011-02-27 NOTE — Assessment & Plan Note (Signed)
OFFICE VISIT   Patricia [name], Patricia [name]  DOB:  20-Feb-1932                                       04/23/2007  [id number]   I saw Ms. Lievanos in the office today for evaluation for access.  She had  a left upper arm AV graft placed originally in June of 2005.  She had  multiple thrombectomies and revisions and ultimately had a new graft  placed in the right arm I believe in 2008, although I do not have the Op  Note from this procedure.  She has had thrombectomy revisions on the  right also and is currently being dialyzed via a Diatek catheter in the  right IJ.  It was felt that she would likely need a thigh graft;  however, the nephrologist arranged for bilateral central venograms which  showed that she did not have any central venous stenosis and appeared to  have patent brachial veins bilaterally.   On my review of the films, it does appear that on the left side,  interestingly, there is brachial veins patent and she could potentially  have a redo graft in the left arm.  Before proceeding with a thigh  graft, I think it is reasonable to attempt this and this has been  scheduled for July 22nd on a nondialysis day.  She does understand that  there is a chance that we will find that this is not possible, in which  case we would have to re-evaluate her for a thigh graft.  Of note, her  venogram was in May of 2008.   Judeth Cornfield. Scot Dock, M.D.  Electronically Signed   CSD/MEDQ  D:  04/23/2007  T:  04/24/2007  Job:  [id number]

## 2011-02-27 NOTE — Op Note (Signed)
[name], Patricia [name]               ACCOUNT NO.:  [id number]   MEDICAL RECORD NO.:  [id number]          PATIENT TYPE:  OUT   LOCATION:  [facility]C                          FACILITY:  [facility]   PHYSICIAN:  Theotis Burrow IV, MDDATE OF BIRTH:  11/27/1931   DATE OF PROCEDURE:  06/26/2007  DATE OF DISCHARGE:                               OPERATIVE REPORT   PREOPERATIVE DIAGNOSIS:  End stage renal disease.   POSTOPERATIVE DIAGNOSIS:  End stage renal disease.   PROCEDURE PERFORMED:  Removal of right internal jugular tunnel catheter.   TYPE OF ANESTHESIA:  Local.   BLOOD LOSS:  Minimal.   INDICATIONS FOR PROCEDURE:  This is a 77 year old female with end stage  renal disease who had a right IJ Diatek catheter placed, but it is now  time for removal.  Informed consent was signed.   PROCEDURE:  Lidocaine 1% was used for local anesthesia.  After a sterile  prep, blunt dissection was used to expose the cuff of the catheter.  The  fibrinous tissue around the cuff of the catheter was divided both  bluntly and with scissors.  Once the cuff had been fully mobilized, the  catheter was removed.  Manual pressure was held of several minutes until  hemostatic.  Sterile dressings applied.  There were no complications.      Eldridge Abrahams, MD  Electronically Signed     VWB/MEDQ  D:  06/26/2007  T:  06/26/2007  Job:  [id number]

## 2011-02-27 NOTE — Op Note (Signed)
Patricia [name], Patricia [name]               ACCOUNT NO.:  [id number]   MEDICAL RECORD NO.:  [id number]          PATIENT TYPE:  AMB   LOCATION:  SDS                          FACILITY:  [facility]   PHYSICIAN:  Dorothea Glassman, M.D.    DATE OF BIRTH:  28-Mar-1932   DATE OF PROCEDURE:  02/19/2007  DATE OF DISCHARGE:                               OPERATIVE REPORT   SURGEON:  Gordy Clement, MD   ASSISTANT:  Jadene Pierini, PA.   ANESTHETIC:  Local with MAC.   PREOPERATIVE DIAGNOSES:  1. End-stage renal failure.  2. Clotted right upper arm arteriovenous graft.   POSTOPERATIVE DIAGNOSES:  1. End-stage renal failure.  2. Clotted right upper arm arteriovenous graft.   PROCEDURES:  1. Thrombectomy, right upper arm arteriovenous graft.  2. Intraoperative shuntogram.   OPERATIVE PROCEDURE:  The patient was brought to the operating room in  stable condition.  Placed in supine position.  Right arm prepped and  draped in a sterile fashion.   Skin and subcutaneous tissue instilled with 1% Xylocaine with  epinephrine.  A longitudinal skin incision made through the right  axilla.  Dissection carried down to expose the venous anastomosis.  There was an end-to-end anastomosis to the axillary vein.  The vein  mobilized proximally and controlled with a Gregory clamp.  The graft was  thrombosed.  The venous anastomosis was taken down.  There was minimal  pseudointimal disease.  The vein was widely patent.  The graft  thrombectomized with 4 Fogarty catheter.  Excellent inflow obtained.  The venous anastomosis then reclosed with running 6-0 Prolene suture.  Clamps were then removed.  Excellent flow present.   Intraoperative shuntogram obtained with injection of 20 mL of contrast  solution.  This verified mild to moderate degeneration of the graft.  No  dominant stenosis identified within the graft.  There was good flow  across the arterial anastomosis.   The subcutaneous tissue then closed with running 3-0  Vicryl suture in  two layers.  Skin closed for 4-0 Monocryl.  Steri-Strips applied.  The  patient tolerated the procedure well.  Transferred to the recovery room  in stable condition.  No apparent complications.      Dorothea Glassman, M.D.  Electronically Signed     PGH/MEDQ  D:  02/19/2007  T:  02/19/2007  Job:  [id number]

## 2011-02-27 NOTE — Op Note (Signed)
[name], Patricia [name]               ACCOUNT NO.:  [id number]   MEDICAL RECORD NO.:  [id number]          PATIENT TYPE:  AMB   LOCATION:  SDS                          FACILITY:  [facility]   PHYSICIAN:  Charles E. Fields, MD  DATE OF BIRTH:  16-Feb-1932   DATE OF PROCEDURE:  02/20/2007  DATE OF DISCHARGE:                               OPERATIVE REPORT   PROCEDURE:  1. Neck ultrasound.  2. Insertion of a right internal jugular vein Diatek catheter.   PREOPERATIVE DIAGNOSIS:  End-stage renal disease.   POSTOPERATIVE DIAGNOSIS:  End-stage renal disease.   ANESTHESIA:  Local with IV sedation.   OPERATIVE FINDINGS:  A 23 cm Diatek catheter right internal jugular  vein.   DESCRIPTION OF PROCEDURE:  After obtaining informed consent, the patient  was taken to the operating room.  The patient was placed in the supine  position on the operating table.  After adequate sedation, the patient's  neck was inspected with an ultrasound.  Right internal jugular vein was  found to be widely patent with normal compressibility and respiratory  variation.  Next, the patient's entire neck and chest were prepped and  draped in the usual sterile fashion.  Local anesthesia was infiltrated  over the right internal jugular vein.  The small finder needle was used  to localize right internal jugular vein.  A larger introducer needle was  then used to cannulate the right internal jugular vein.  A 0.035 J-tip  guide wire threaded into the right internal jugular vein.  This would  not advance down into the superior vena cava and was going out the right  subclavian vein.  Therefore, the J-wire was pulled back and a 0.035  Glidewire was brought up in the operative field.  This was then advanced  further down the right subclavian vein.  A 5-French end-hole catheter  was then placed through the right internal jugular vein and out into the  right subclavian system.  The 5-French end-hole catheter was pulled  back.  After  some manipulation I was able to advance the Glidewire down  the superior vena cava and into the inferior vena cava under  fluoroscopic guidance.  The 5-French end-hole catheter was advanced down  to the inferior vena cava and the Glidewire was then removed.  A 0.035 J-  tip guidewire was then exchanged through the 5-French end-hole catheter.  Next, sequential 12, 14 and 16-French dilators were placed over the  guidewire into the right atrium.  The guidewire and dilator was removed  and a 23 cm Diatek catheter placed through the peel-away sheath into the  right atrium.  Catheter was then tunneled subcutaneously, cut to length  and hub attached.  Catheter was noted to flush and draw easily.  Catheter was inspected under fluoroscopy and found with its tip to be in  the right atrium with no kinks throughout its course.  Catheter was  sutured to the skin with nylon sutures.  The neck insertion site was  closed with Vicryl stitch.  Catheter  was then loaded with concentrated heparin solution.  The patient  tolerated  the procedure well and there were no complications.  Instrument, sponge and needle count was correct at the end of the case.  The patient taken to the recovery room in stable condition.      [name], MD  Electronically Signed     CEF/MEDQ  D:  02/20/2007  T:  02/20/2007  Job:  [id number]

## 2011-02-27 NOTE — Op Note (Signed)
NAMEHOLLAND, Patricia [name]               ACCOUNT NO.:  [id number]   MEDICAL RECORD NO.:  [id number]          PATIENT TYPE:  AMB   LOCATION:  [place]                           FACILITY:  [facility]   PHYSICIAN:  Charles A. Delcambre, MDDATE OF BIRTH:  Jul 28, 1932   DATE OF PROCEDURE:  06/17/2007  DATE OF DISCHARGE:                               OPERATIVE REPORT   PREOPERATIVE DIAGNOSES:  1. Postmenopausal bleeding.  2. Endometrial polyp.   POSTOPERATIVE DIAGNOSES:  1. Postmenopausal bleeding.  2. Endometrial polyp.   PROCEDURE PERFORMED:  1. Hysteroscopy.  2. Dilation and curettage.  3. Polypectomy.  4. Paracervical block.   COMPLICATIONS:  None.   FLUIDS:  Sorbitol 40 cc.   Instrument and sponge count correct x2.   OPERATIVE FINDINGS:  Large 2 cm posterior endometrial polyp.  Otherwise  atrophic endometrium.   SPECIMENS:  1. Endometrial curettings.  2. Polyp.  These were sent to pathology.   BLOOD LOSS:  Less than 10 mL.   ANESTHESIA:  General by the endotracheal route.   DESCRIPTION OF PROCEDURE:  The patient was taken to the operating room,  placed in the supine position.  Anesthesia was induced without  difficulty.  She was then placed in the dorsal lithotomy position in  universal stirrups.  Tenaculums was used on the cervix anterior lip.  A  paracervical block with 0.25% plain Marcaine was placed at 4 and 8  o'clock.  There was no evidence of intravascular location of injection.  Hanks dilators were used to dilate and after sounding was noted to be in  8 cm.  Dilation was carried out enough to pass the small  hysteroscope.  Adequate visualization was obtained.  Scope was removed.  There was no  evidence of perforation.  Polyp forceps were used to remove the polyp  easily.  Re-scoping yielded just a small bit of the base of the polyp  remaining.  Scope was removed.  A small banjo curette was used to  curette this area and then the entire circumferentially uterine cavity.  Minimal tissue and re-visualization with the scope yielded an empty  cavity with the polyp base removed as well.   The procedure was then terminated and the cervix was of good hemostasis.  Tenaculum was removed and hemostasis was adequate.  All instruments were  removed.  The patient was awakened, taken to recovery with physician in  attendance, having tolerated procedure well.      Charles A. Bing Matter, MD  Electronically Signed     CAD/MEDQ  D:  06/17/2007  T:  06/17/2007  Job:  [id number]

## 2011-03-02 NOTE — Consult Note (Signed)
NAME:  Patricia [name], Patricia [name]                         ACCOUNT NO.:  [id number]   MEDICAL RECORD NO.:  [id number]                   PATIENT TYPE:  INP   LOCATION:  5504                                 FACILITY:  [facility]   PHYSICIAN:  Scarlett Presto, M.D.                DATE OF BIRTH:  08-26-1932   DATE OF CONSULTATION:  DATE OF DISCHARGE:                                   CONSULTATION   PRIMARY CARE PHYSICIAN:  Dr. Parke Poisson. Gegick.   PRIMARY CARDIOLOGIST:  She does not have a primary cardiologist.   RENAL PHYSICIAN:  Dr. Windy Kalata.   ALLERGIST:  Dr. Dondra Spry, M.D.   OTHER PHYSICIAN:  Dr. Christiane Ha T. Velma.   SUMMARY OF HISTORY:  Patricia [name] is a 77 year old white female who was  admitted from hemodialysis through [facility] secondary to  shortness of breath.  We were asked to consult in search for a cardiac  etiology to her shortness of breath.  She describes a 3-day history of  shortness of breath primarily with exertion, i.e., walking, stairs,  housework.  She states that she usually works through it and it will last as  long as she is exerting herself; when she sits down and takes some deep slow  breaths, it will resolve.  She has not had any orthopnea, PND or rest  episodes.  The discomfort is not pleuritic, nor has she had any associated  fevers, chills, edema, weight gain, clothes tightening or associated chest  discomfort.  She does describe a cough without production for unknown  duration.  She stated that at hemodialysis on Tuesday, she told them about  her shortness of breath and she described them as unable to remove fluid  secondary to her blood pressure and she remained short of breath, thus her  admission.  She also describes a questionable history of palpitations which  she describes as a fast heart beat, but she does not count it and she is not  sure about the fast heart beat and cannot really describe.  She does notice  that there has been  increased frequency over the last 3 days.  She describes  a prior occurrence of problems with shortness of breath during her previous  admission in January through February; she cannot relate the specific date.  She stated that they took 4 units off her chest.   Her past medical history is notable for a prolonged hospitalization in  January and February secondary to acute renal failure felt to be secondary  to dehydration and urosepsis from E. coli.  During that admission, she did  have some anemia which required multiple transfusions complicated by  pulmonary edema.  She ended up on hemodialysis on Tuesdays, Thursdays and  Saturdays.  She also had complications of a DVT and has been placed on  Coumadin, which is regulated by her primary M.D.  She also has  a history of  GERD, asthma, hyperlipidemia, with a last check on January 05, 2004 showing  total cholesterol of 327, triglycerides 568, HDL 53; her LDL was not  calculated.  The patient relates an intolerance to several statins, however,  she cannot recall the statins' name specifically.   PAST MEDICAL HISTORY:  She has a history of:  1. Hypothyroidism and thyroid nodule.  2. Depression.  3. DJD.  4. Meningioma requiring gamma surgery back in 2000 or 2001.   ALLERGIES:  Her allergies include PENICILLIN, SULFA, ERYTHROMYCIN,  CEPHALOSPORINS and CODEINE.  There is also TOPROL listed as an allergy,  however, patient cannot relate intolerance or actual allergy to this  medication.   MEDICATIONS PRIOR TO ADMISSION:  1. Coumadin 7 mg daily.  2. Colchicine 0.6 mg daily.  3. Nephro-Vite 1 daily.  4. Phenergan 25 mg q.6 h. p.r.n.  5. Sorbitol p.r.n.  6. Advair 250/50 mcg 1 b.i.d.  7. Lasix 80 mg daily.  8. Lexapro 10 mg daily.  9. Synthroid 100 mcg daily.  10.      Nexium 40 mg daily.   SOCIAL HISTORY:  She resides in [place] with her husband for the last 50  years.  She is a retired Chief Technology Officer.  She has 2 sons,  both  are alive and well; 1 is a recovering addict.  She denies any alcohol,  tobacco, drug, herbal medication or exercise program.  She does stick to a  renal diet.   FAMILY HISTORY:  Her mother died at the age of 57 from Alzheimer's, her  father at the age of 50 from cirrhosis and ETOH.  She has 1 sister who is  alive and well.   REVIEW OF SYSTEMS:  In addition to the above notations, she has lost 25  pounds since January 2005.  She wears glasses.  She does have occasional  wheezing which she states is nothing unusual.  She is postmenopausal.  She  has chronic nausea and vomiting and has developed diarrhea today.   PHYSICAL EXAM:  GENERAL:  Well-nourished, well-developed, pleasant white  female in no apparent distress.  She does have a resting tremor on  observation.  VITAL SIGNS:  Temperature is 97.9, weight 67.4 kg, pulse 84 and regular,  respirations 20, blood pressure 114/46, 95% saturation on room air.  T-max  was 100.  INRs have not been calculated.  It is not clear what her baseline  weight is for hemodialysis.  HEENT:  Unremarkable.  NECK:  Neck supple without thyromegaly, adenopathy, JVD or carotid bruits.  LUNGS:  Symmetrical excursion, clear to auscultation.  HEART:  Regular rate and rhythm, normal S1 and S2.  No murmurs, rubs, clicks  or gallops.  All pulses are symmetrical and intact.  SKIN:  Skin does not show any integument breaks.  ABDOMEN:  Bowel sounds present.  Soft, without organomegaly, masses or  tenderness.  EXTREMITIES:  Negative cyanosis, clubbing or edema.  Again, peripheral  pulses are all symmetrical and intact.  MUSCULOSKELETAL:  Unremarkable.  NEUROLOGIC:  Alert and oriented x3.  Cranial nerves II-XII are grossly  intact.   LABORATORY DATA:  Chest x-ray did not show any active disease.   A V/Q scan did not show evidence of pulmonary embolism.   An EKG was just performed and shows normal sinus rhythm with a rate of 80, left axis deviation with  normal intervals, nonspecific ST-T wave changes.  There is a possibility that V1 and V2 may have been  reversed.   Hemoglobin and hematocrit 13.8 and 39.5, normal indices, platelets 332,000,  WBC is 11.1.  Sodium 134, potassium 3.5, BUN 12, creatinine 2.6, glucose  143.  Normal LFTs.  TSH was slightly low at 0.093, however, T4 was 11.3.  BNP is 33.5.  Room air ABG was 7.53, PCO2 31.5, PO2 82.2, bicarb 26.2 and  97.4% saturation on room air.   IMPRESSION:  1. Dyspnea on exertion, uncertain etiology, may be multifactorial secondary     to deconditioning and asthma history.  There has not been any evidence of     pulmonary embolism by V/Q scan.  There is no evidence of congestive heart     failure by exam, x-ray or laboratory values.  Echocardiogram has just     been read and shows an ejection fraction of 50% to 55%, moderate     hypokinesis of the inferior wall; the right ventricle was not well-     visualized; there was a possible small echo-free pericardial effusion     circumferential to the heart but no evidence of tamponade; it was noted     that this was an incomplete assessment for hemodynamic significance; 1 to     2+ tricuspid regurgitation; moderate aortic valve calcification; 1+     mitral regurgitation; read by Dr. Wilhemina Cash.  2. Low thyroid-stimulating hormone, however, T4 is within normal limits, and     a history of hypothyroidism.  3. Hyperlipidemia with a reported intolerance of multiple statins, currently     being pursued by Dr. Electa Sniff.   PLAN:  Dr. Wilhemina Cash read her echo, reviewed the patient's history, spoke with  and examined the patient.  At this time, he does not feel that there is a  cardiac etiology to her shortness of breath, however, given her possible  wall motion abnormality on her echocardiogram, we will arrange an adenosine  Cardiolite to further evaluate her LV function and possible ischemia.  In  regards to her risk factors of hyperlipidemia, we will leave  this to Dr.  Electa Sniff, unless he requires our assistance or recommendations.     Sharyl Nimrod, P.A. [facility]                    Scarlett Presto, M.D.    EW/MEDQ  D:  01/12/2004  T:  01/13/2004  Job:  [id number]   cc:   Parke Poisson. Electa Sniff, M.D.  [id number] [place] [address]  Fax: [phone]   Windy Kalata, M.D.  [address]  [place]  [place] [address]  Fax: [phone]   Dondra Spry, M.D.  [place] [employer]  [place]  [place] [address]  Fax: [phone]   [name] T. [name], M.D.  [place]. [place], [place] [address]  Fax: [phone]

## 2011-03-02 NOTE — Cardiovascular Report (Signed)
NAMEGYSELLE, RENSCHLER               ACCOUNT NO.:  [id number]   MEDICAL RECORD NO.:  [id number]          PATIENT TYPE:  INP   LOCATION:  [place]                         FACILITY:  [facility]   PHYSICIAN:  Mohan N. Terrence Dupont, M.D. DATE OF BIRTH:  12-29-1931   DATE OF PROCEDURE:  05/23/2006  DATE OF DISCHARGE:                              CARDIAC CATHETERIZATION   PROCEDURES PERFORMED:  Left cardiac catheterization with selective left and  right coronary angiography, left ventriculography via right groin using  Judkins' technique.   INDICATIONS FOR PROCEDURE:  Ms. Eitzen is a 77 year old white female with  past medical history significant for hypothyroidism, history of bronchial  asthma and depression, end-stage renal disease on hemodialysis, history of  allergic rhinitis.  She came to the ER complaining of retrosternal chest  pressure radiating to the neck and shoulders, grade 8/10 associated with  mild shortness of breath, diaphoresis and nausea which woke her up around 1  a.m.  Denies any palpitations, lightheadedness or syncope.  EKG done in the  ER showed normal sinus rhythm with minimal ST elevation in I and V6 and T-  wave inversion in aVL which were new as compared to prior EKG.  Code STEMI  was called.  The patient denies any relation of chest pain to breathing,  movement, food.  I discussed with the patient regarding minor EKG changes  and atypical anginal pain and left __________ PTCA stenting.  Risks and  benefits, i.e., death, MI, stroke, need for emergent CABG, risk of  restenosis and local vascular complications accepted and consented for the  procedure.   PROCEDURE:  After obtaining the informed consent, the patient was brought to  the cath lab and was placed on fluoroscopy table.  The right groin was  prepped and draped in the usual fashion.  Xylocaine 2% was used for local  anesthesia in the right groin.  With the help of thin-walled needle, a 6-  French arterial sheath was  placed.  The sheath was aspirated and flushed.  Next, a 6-French left Judkins catheter was advanced over the wire under  fluoroscopic guidance up to the ascending aorta.  The wire was pulled out.  The catheter was aspirated and connected to the manifold.  The catheter was  further advanced and engaged into left coronary ostium.  Multiple views of  the left system were taken.  Next, the catheter was disengaged and was  pulled out over the wire and was replaced with 6-French.  Right Judkins  catheter which was advanced over the wire under fluoroscopic guidance up to  the ascending aorta.  The wire was pulled out.  The catheter was aspirated  and connected to the manifold.  The catheter was further advanced and  engaged into the right coronary ostium.  Multiple views of the right system  were taken.  Next, the catheter was disengaged and was pulled out over the  wire and was replaced with 6-French pigtail catheter which was advanced over  the wire under fluoroscopic guidance up to the ascending aorta.  The wire  was pulled out.  The catheter was aspirated  and connected to the manifold.  The catheter was further advanced across the aortic valve into the LV.  LV  pressures were recorded.  Next, LV graft was done in the 30-degree RAO  position.  Post angiographic pressures were recorded from LV and then  pullback pressures were recorded from the aorta.  There was no significant  gradient cross the aortic valve.  Next, the pigtail catheter was pulled out  over the wire.  Sheaths were aspirated and flushed.   FINDINGS:  LV showed good LV systolic function.  EF of 55-60%.  Left main  was patent.  LAD has 5-10% proximal and mid stenosis.  Diagonal 1 was very  small which was patent.  Diagonal 2 was small which was patent.  Left circumflex was large which was patent.  OM1 to om3 were very, very  small.  OM4 was moderate size which was patent.  Ostial was small which was  patent.  The patient  tolerated the procedure well.  There were no  complications.  The patient was transferred to recovery room in stable  condition.           ______________________________  Allegra Lai Terrence Dupont, M.D.     MNH/MEDQ  D:  05/23/2006  T:  05/23/2006  Job:  [id number]   cc:   Cath Lab  Windy Kalata, M.D.

## 2011-03-02 NOTE — H&P (Signed)
NAMETIMBERLY, Patricia [name]               ACCOUNT NO.:  [id number]   MEDICAL RECORD NO.:  [id number]          PATIENT TYPE:  AMB   LOCATION:  [place]                           FACILITY:  [facility]   PHYSICIAN:  Charles A. Delcambre, MDDATE OF BIRTH:  1932-07-21   DATE OF ADMISSION:  DATE OF DISCHARGE:                              HISTORY & PHYSICAL   HISTORY OF PRESENT ILLNESS:  The patient is a 77 year old para 2-0-0-2  with continued post-menopausal bleeding. Once again, an ultrasound did  show hyperechoic mass at the anterior wall, 2.0 x 0.9 cm. She is to be  admitted now having been counseled regarding hysterectomy versus repeat  hysteroscopy, D and C, and she elects repeat hysteroscopy and D and C.   PAST MEDICAL HISTORY:  1. GERD.  2. Hypothyroidism.  3. Depression.  4. Anxiety.  5. Dizziness.  6. COPD versus allergies versus asthma.  7. Renal failure.  8. Recent creatinine in the 4.0 range and hypercholesterolemia.  9. She undergoes dialysis.   PAST SURGICAL HISTORY:  1. Brain surgery for brain tumor not specified.  2. Benign hysteroscopy, dilation and curettage in 2006.  3. Benign post-menopausal bleeding.  4. Endometrial polyp was noted and proliferative endometrium was noted      at that time.   MEDICATIONS:  1. Nexium 40 mg once a day.  2. Synthroid 88 mcg once a day.  3. Antara 130 mg once a day.  4. Lexapro 10 mg once a day.  5. Premarin 0.3 mg once a day.  6. Prometrium 100 mg once a day.  7. Astelin 17 mL 2 sprays per day.  8. Singulair 10 mg once a day.  9. Albuterol inhaler 2 sprays as needed.  10.Advair 50 mg 2 times per day.  11.Zyrtec 10 mg per day.  12.Allergy vaccine 2 per week.  13.Lasix 80 mg 2 times per day.  14.Nephrovite vitamin once a day.  15.Ativan 0.5 mg once a day.  16.Zegerid 40 mg once a day.  17.Bayer aspirin 81 mg once a day.  18.PhosLo 657 mg twice a day.   ALLERGIES:  PENICILLIN AND SOME MYCINS.   SOCIAL HISTORY:  Denies tobacco, ethanol or  drug exposure in the past.  She is married, monogamous relationship with her husband.   FAMILY HISTORY:  Father deceased, alcoholism. Mother deceased, 47, not  specified. Sister alive at 73 with breast cancer at age 93 diagnosed and  NAD at this point. Breast cancer survivor.   REVIEW OF SYSTEMS:  She has minimal hot flashes on current estrogen  dose. Using informed consent, wishes to continue. Vaginal itching and  discharge occasionally. CONSTITUTIONAL: Denies fevers, chills, rashes,  lesions, headaches, dizziness, seasonal allergies, chest pain, shortness  of breath, wheezing, diarrhea, constipation, bleeding, melena,  hematochezia, urgency. No dysuria, incontinence or hematuria. No  galactorrhea and no emotional changes.   PHYSICAL EXAMINATION:  BREASTS: No masses, tenderness, discharge, skin,  nipple changes bilaterally.  HEART: Regular rate and rhythm.  LUNGS: Clear bilaterally.  ABDOMEN: The abdomen is soft, nontender, nondistended.  PELVIC: Normal external female genitalia. Bartholin, urethral, Skene  within  normal limits. Urethral meatus normal. Urethra normal. Bladder  normal. Vagina normal, anestrogenic. Multiparous cervix is noted. Vault  with small amount of discharge. Pap smear was negative. Wet prep was  negative. Bimanual exam, uterus is anteverted, 6 to 8 weeks' size. Vulva  nontender. Adnexal regions nontender without masses palpable  bilaterally.  RECTAL: Exam is not done. Anus, perineal body appear normal. She relates  that she had a virtual colonoscopy 3 years ago which was negative.   ASSESSMENT:  1. Post-menopausal bleeding, 627.1.  2. Endometrial polyp, 621.0.   PLAN:  Hysteroscopy, D and C. She gives informed consent, etc, urinary  perforation, failure to control bleeding, possibility of polyp growing  once again in the future as it has now. All questions were answered.  Bowel and bladder damage are risks as well. We will proceed as outlined  with  monitored anesthesia care and paracervical block with hysteroscopy  D and C on 8/27, 0730 hours.      Charles A. Bing Matter, MD  Electronically Signed     CAD/MEDQ  D:  06/10/2007  T:  06/10/2007  Job:  [id number]   cc:   Ronald Lobo, M.D.  Fax: [phone]   Parke Poisson. Electa Sniff, M.D.  [phone]   Dondra Spry, M.D.  Fax: [place] Lorrene Reid, M.D.  Fax: [phone]

## 2011-03-02 NOTE — Discharge Summary (Signed)
NAME:  OTILLIE, LOMELI                         ACCOUNT NO.:  [id number]   MEDICAL RECORD NO.:  [id number]                   PATIENT TYPE:  INP   LOCATION:  5504                                 FACILITY:  [facility]   PHYSICIAN:  Donato Heinz, M.D.             DATE OF BIRTH:  Jan 22, 1932   DATE OF ADMISSION:  01/11/2004  DATE OF DISCHARGE:  01/13/2004                                 DISCHARGE SUMMARY   ADMISSION DIAGNOSES:  1. Acute __________  asthma.  2. History of depression.  3. Hypothyroidism.  4. History of gastroesophageal reflux disease.  5. History of deep vein thrombosis on Coumadin.   DISCHARGE DIAGNOSES:  1. Dyspnea/respiratory--respiratory alkalosis with anxiety.  2. Renal failure, dialysis dependent.  3. Anemia of chronic disease.  4. Hypothyroidism.  5. History of deep vein thrombosis on Coumadin.  6. History of dyslipidemia.  7. History of depression.   PROCEDURES:  1. Hemodialysis.  2. [place] Cardiolite.  3. VQ scan.   BRIEF HISTORY/REASON FOR ADMISSION:  Ms. Veltri is a 77 year old Caucasian  female with multiple medical problems most notable for acute renal failure  following E. coli urosepsis with hospitalization January 2005, required  hemodialysis at that time and continues supportive dialysis, history of  asthma, history of DVT on Coumadin, history of chronic coagulopathy.  She  presented for dialysis with increasing dyspnea on exertion and shortness of  breath.  The patient's husband reports she had been breathing heavily for  the past several days.  She denies any anxiety or being anxious; however, he  reports she has been quite agitated, tremulous, with increased work of  breathing where she breathes heavily during the day; however, she goes to  sleep and she breathes normally and comfortably.  She went to dialysis,  again started having trouble breathing today.  It was thought that she had  volume overload with fluid in her lungs.  At the dialysis  unit, they were  able to take some fluid off; however, she started having more shortness of  breath and was sent to the ER following hemodialysis for evaluation.   She denied any chest pain but complained of difficulty breathing.  She  denied any dysuria, pyuria, hematuria, urgency, frequency, chills, or fevers  at home.  She did report some increasing urination over the past several  weeks.   PHYSICAL EXAMINATION:  GENERAL:  On physical exam in the ER, she appeared  slightly anxious.  VITAL SIGNS:  Temperature was 98.2, respiratory rate was 24, blood pressure  was 128/68, pulse oximetry was 98% on room air.  LUNGS:  Clear to auscultation and percussion bilaterally.  CARDIAC:  Regular rate and rhythm, no rub was appreciated.  ABDOMEN:  Bowel sounds were positive, soft, nontender.  EXTREMITIES:  No cyanosis, no edema, no clubbing.  She had a right IJ  PermCath in place.  No discharge appreciated.   LABORATORY DATA ON ADMISSION:  Chest  x-ray showed no active disease.  She  had a VQ scan which showed low probability.   Her serum sodium was 135, potassium 3.4, BUN was 9, creatinine was 2.2,  glucose 100, CO2 was 26.  I-STAT pH was 7.73, PCO2 was 19.  Hemoglobin was  13.8, white count was 11.  Arterial blood gas on room air showed pH of 7.69,  PCO2 was 25, PO2 of 59, and O2 saturation of 96%.   Dr. Donato Heinz admitted the patient because of her shortness of  breath with respiratory alkalosis/hypoxia with differential diagnoses of  anxiety, asthma exacerbation, sepsis, liver failure, salicylate  intoxication, pneumonia, pulmonary embolism, thyroid toxicity with her  history of thyroid disease.  Her INR was 2.8, her VQ scan was negative, and  she was therapeutic on Coumadin.  PE was low on the list of differential.  He was to treat her with her anxiety medications including Xanax 0.25 mg in  the ER, continue to follow up with this, continue her on her Lexapro.  May  consider  pulmonary consultation if she is not stable in the morning or  cardiac as a possible source also.   Continue her on hemodialysis as needed and we will check a 24-hour urine for  creatinine clearance.  Her creatinine was 2.2 but this was right after  hemodialysis.  She has a history of prolonged ischemic ATN and presumed  hemodialysis dependent.   HOSPITAL COURSE:  #1 - SHORTNESS OF BREATH/ALKALOSIS:  The patient felt  better the second day.  Her lungs were clear.  Repeat blood gas ordered  showed that her hypoxemia was resolving.  Dr. Mercy Moore ordered [place]  cardiac consult to rule out cardiac source of her shortness of breath.  A 2-  D echocardiogram was done with results showing left ventricular size normal  and overall LV function at lower limits with an ejection fraction of 50%-  55%.  Moderate hypokinesis of entire inferior wall.  The aortic valve was  moderately calcified.  Mitral regurgitation grade 1+.  Tricuspid  regurgitation 1-2+.  A small echo-free pericardial effusion was  circumferential to the heart.  No evidence of pericardial constriction.  Read by Dr. Wilhemina Cash.  [facility] performed a dobutamine Cardiolite.  No inducible ischemia was noted.  No scar was appreciated.  She had minimal  shortness of breath with O2 saturation 98% on room air.  It was thought that  anxiety was part of her problems.  She will be continued on Lexapro 10 mg  daily.  Xanax was not continued as an outpatient medication.  Volume  overload was not evident, also a chest x-ray showed no acute disease.  She  continued to have good urine output and the etiology of her shortness of  breath was thought to be a combination of asthma and anxiety; however, she  was not wheezing during this admission.  Again, did have some Xanax q.12h.  and at bedtime during the hospital which helped.  She was not sent home on  Xanax, however.  Continued on Lexapro 10 mg daily.  #2 - RENAL FAILURE:  As noted by Dr.  Moshe Cipro, she had a creatinine  clearance during this time with a 24-hour collection of 9.  Her creatinine  levels off hemodialysis went from 2.6 to 3.3.  Dr. Moshe Cipro thought that  she could continue hemodialysis at [facility] receiving one  treatment during this hospital stay with further followup Saturday.  Continue Tuesday, Thursday, and Saturday dialysis  at the center and evaluate  for possible further changes in her schedule depending on creatinine and  volume levels.  With creatinine clearance at 9, she would still need  hemodialysis.   #3 - ANEMIA:  The patient's hemoglobin on admission noted to be 13.  She  will continue EPO at 1000 units each dialysis and have iron studies  continued also at dialysis unit.  She was not on IV iron at time of  discharge.   #4 - HISTORY OF DEEP VEIN THROMBOSIS:  The patient's INR was 2 on January 13, 2004.  Dr. Electa Sniff was dosing Coumadin as an outpatient and will continue  this also.  She was discharged on Coumadin 5 mg 1/2 tablet each day.   DISCHARGE MEDICATIONS:  1. Coumadin 5 mg 1-1/2 tablet daily.  2. Colchicine 0.6 mg daily.  3. Lasix 80 mg daily.  4. Lexapro 10 mg daily.  5. Synthroid 100 mcg 1 daily.  6. Nexium 40 mg daily.  7. Advair 100/50 one b.i.d.  8. Imodium 2 mg q.6h. p.r.n. diarrhea.  9. Phenergan 25 mg q.6h. p.r.n. nausea.  10.      Tylenol 1 q.6h. p.r.n. pain.   She was instructed to continue following a renal failure diet.  Continue to  keep her hemodialysis PermCath dry and clean.  We will assess the [employer]  graft/fistula at the kidney center.  Further followup at [facility] on Tuesday, Thursday, and Saturday schedule.      Foye Clock, P.A.                    Donato Heinz, M.D.    DWZ/MEDQ  D:  02/29/2004  T:  03/01/2004  Job:  [id number]   cc:   Parke Poisson. Electa Sniff, M.D.  [id number] [address] [place] [address]  Fax: [place]   [facility]

## 2011-03-02 NOTE — H&P (Signed)
NAME:  Patricia [name], Patricia [name] NO.:  [id number]   MEDICAL RECORD NO.:  [id number]                   PATIENT TYPE:  EMS   LOCATION:  MAJO                                 FACILITY:  [facility]   PHYSICIAN:  Donato Heinz, M.D.             DATE OF BIRTH:  1932-09-25   DATE OF ADMISSION:  01/11/2004  DATE OF DISCHARGE:                                HISTORY & PHYSICAL   CHIEF COMPLAINT:  Shortness of breath.   HISTORY OF PRESENT ILLNESS:  Ms. Ketcham is a 77 year old white female with  multiple medical problems, most notable for acute renal failure following E  coli urosepsis during her hospitalization in January of 2005, requiring  hemodialysis, and continued supportive dialysis, asthma, history of deep  vein thrombosis on Coumadin, and chronic nausea and vomiting.  She presented  at dialysis with increasing dyspnea on exertion and shortness of breath.  The patient's husband reports that she has been breathing heavily for the  last several days.  She denies any anxiety or anxiousness.  However, he  reports that she is quite agitated and tremulous and has had some increased  work of breathing where she breaths heavily during the day.  However, when  she goes to sleep, she breaths normally and comfortably.  She went to  dialysis today and was again having trouble breathing.  They thought it was  fluid on her lungs.  They tried to take some fluid off of her; however, she  started having worsening shortness of breath and was sent to the emergency  room following dialysis.  She denied any chest pain.  No pain anywhere, but  continued to complain of difficulty breathing.  She does report having  increased urination over the last several weeks.  She denies any dysuria,  pyuria, hematuria, urgency, frequency, fevers, chills at home.   ALLERGIES:  PENICILLIN, SULFA, ERYTHROMYCIN, CEPHALOSPORINS, CODEINE,  TOPROL.   PAST MEDICAL HISTORY:  1. Acute renal failure following E  coli sepsis and ischemic ATN.  2. History of meningioma status post Gamma knife radiation.  3. Gastroesophageal reflux disease.  4. Asthma.  5. Dyslipidemia.  6. History of thyroid nodule.  7. [facility] from January 10 to November 30, 2003, with E coli     sepsis, cholelithiasis, deep vein thrombosis.  She was started on     Coumadin.  8. Hypothyroidism.  9. Depression.  10.      Degenerative joint disease.  11.      Chronic nausea and vomiting, followed by Dr. __________.  12.      Deep vein thrombosis in her right leg.  Therapeutic Coumadin dose.  13.      Acute renal failure with protracted recovery and hemodialysis     dependence since October 26, 2003.   CURRENT MEDICATIONS:  1. Coumadin 7 mg a day.  2. Tricor 145 mg a day, new, to be started.  3. Colchicine 0.6 mg daily.  4. Nephro-Vite one a day.  5. Phenergan 25 mg q.6h. p.r.n. nausea.  6. Sorbitol p.r.n.  7. Advair metered dose inhalers, one puff b.i.d.  8. Lasix 80 mg a day.  9. Lexapro 10 mg a day.  10.      Synthroid 100 micrograms a day.  11.      Nexium 400 mg a day.   FAMILY HISTORY:  Mother died at age 49 from Alzheimer's disease.  Father  died at age 92 from cirrhosis.  He was an alcoholic.  She has one sister.  No family history of kidney disease.   SOCIAL HISTORY:  She is married to her husband for 50 years.  They have two  sons.  One son is in good health.  The other son is a recovering addict.  No  tobacco, no alcohol.   REVIEW OF SYSTEMS:  GENERAL:  The patient denies any anorexia, malaise.  No  blurred vision or photophobia.  ENT:  No tenderness, dysphagia, odynophagia.  PULMONARY:  Does have shortness of breath and dyspnea on exertion.  No  productive cough.  No hemoptysis.  CARDIAC:  Denies any chest pain or  palpitations.  GI:  Persistent nausea and vomiting, no hematochezia, melena  or bright red blood per rectum.  GU:  No dysuria, pyuria, hematuria, urgency  or frequency.   RHEUMATOLOGIC:  No arthralgias or myalgias.  HEMATOLOGIC:  No  abnormal bleeding or bruising.  NEUROLOGIC:  No numbness, tingling,  weakness, ataxia or aphasia.  DERMATOLOGIC:  No rashes, lumps or bumps.  All  other systems are negative.   PHYSICAL EXAMINATION:  GENERAL:  This is an elderly female who appears  slightly anxious.  VITAL SIGNS:  Temperature 98.2, pulse 96, respiratory rate 24, blood  pressure 128/68.  Pulse oximetry 98% on room air.  HEENT:  Exam is normocephalic, atraumatic.  Pupils are equal, round and  reactive to light.  Extraocular muscles are intact.  No icterus.  Oropharynx  had no oral lesions appreciated.  LUNGS:  Clear to auscultation and percussion bilaterally, no rales or  rhonchi.  CARDIAC:  Regular rate and rhythm, no precordial rub appreciated.  ABDOMEN:  Normoactive bowel sounds, soft, nontender, nondistended.  No  guarding or rebound.  EXTREMITIES:  No clubbing, cyanosis or edema.  She has a right IJ catheter  in place.   LABORATORY DATA:  Sodium 135, potassium 3.4, chloride 105, cO2 26, BUN 9,  creatinine 2.2, glucose 100.  ISTAT pH was 7.73, pCO2 19.7, hemoglobin 13.8,  white blood cell count 11, platelets 332.  BNP was 33.5.  ABG on room air  7.69, pCO2 25, pO2 59.  Oxygen saturation 96%.   Chest x-ray showed no active disease, no infiltrates, no pulmonary edema. VQ  scan was low probability.   ASSESSMENT/PLAN:  PROBLEM #1.  Shortness of breath, respiratory alkalosis,  hypoxia.  It is unclear if this is truly hypoxia since it may have been a  mixed venous sample, although her oxygen saturation is 96%.  Differential  diagnosis for respiratory alkalosis includes anxiety, asthma exacerbation,  sepsis, liver failure, salicylate intoxication, PE, pneumonia, thyroid  toxicity, pain, fever, among others.  At the present time, she does not have  stigmata of anything other than having hypoxia on this blood gas.  She is on therapeutic doses of Coumadin.  Her  INR is 2.8.  VQ scan was negative and  not changed any current treatment if this was a  PE.  However, will treat her  with oxygen.  We will also treat her with anxiolytics, Xanax 0.25 mg x1.  Continue to follow.  Would avoid a CT scan with contrast given her recovery  of renal function and will continue to follow.  Consider pulmonary  consultation in the morning.  She is currently stable.  I will repeat ABG on  oxygen therapy.  1. Acute renal failure with prolonged ischemic ATN and hemodialysis     dependence.  Creatinine is down to 2.2.  We will hold off on dialysis and     start a 24 hours urine for creatinine clearance in the morning.     Hopefully, her renal function will start to recover enough that she can     come off of dialysis.  We will not order dialysis for now.  Continue to     follow.  2. Asthma.  Continue with her Advair.  She does not have any rhonchi or     wheezes, and we will continue to follow.  3. Depression.  Continue with Lexapro.  4. Hypothyroidism.  Questionable over therapy with Synthroid.  We will check     a TSH and free T3, T4 levels in the morning, and adjust dose accordingly.  5. Gastroesophageal reflux disease.  We will continue with Nexium.  6. Deep vein thrombosis.  The patient is therapeutic on her Coumadin.     Continue with 7 mg.  Continue to follow.                                                Donato Heinz, M.D.    JC/MEDQ  D:  01/11/2004  T:  01/12/2004  Job:  [id number]

## 2011-03-02 NOTE — Discharge Summary (Signed)
Patricia [name], Patricia [name]               ACCOUNT NO.:  [id number]   MEDICAL RECORD NO.:  [id number]          PATIENT TYPE:  INP   LOCATION:  4733                         FACILITY:  [facility]   PHYSICIAN:  Mohan N. Terrence Dupont, M.D. DATE OF BIRTH:  1932/07/04   DATE OF ADMISSION:  05/23/2006  DATE OF DISCHARGE:  05/24/2006                                 DISCHARGE SUMMARY   ADMITTING DIAGNOSIS:  1. Typical anginal chest pain with minor EKG changes, rule out acute      myocardial infarction.  2. End stage renal disease on hemodialysis.  3. Hypothyroidism.  4. History of bronchial asthma  5. History of allergic rhinitis.  6. Depression.   FINAL DIAGNOSIS:  1. status post chest pain, myocardial infarction ruled out status post      left cath, rule out gastroesophageal reflux disease, doubt      pericarditis.  2. End stage renal disease on hemodialysis.  3. Hypothyroidism.  4. History of bronchial asthma.  5. History of allergies rhinitis.  6. Depression.  7. Bronchitis.   DISCHARGE HOME MEDICATIONS:  Synthroid 50 mcg 1 tablet daily as before,  Enterra 130 mg daily, Lisinopril 10 mg 1 tablet daily, Premarin 0.3 mg one  daily, Permethrin 100 mg one daily, Singulair 10 mg 1 tablet daily,  Albuterol inhaler as needed, Advair 1 puff twice daily as before, Zyrtec 10  mg one daily as needed, Lasix 80 mg one twice daily as before, Nephro-Vite 1  tablet daily, enteric-coated aspirin 81 mg 1 tablet daily, Zegrid 40 mg  twice daily, Carafate 1 gram three times daily, PhosLo 667 mg one daily as  before, Ultram 50 mg 1 tablet every 8 hours as needed for pain, Avelox 400  mg 1 tablet daily.   DISCHARGE INSTRUCTIONS:  Diet low salt, low cholesterol renal diet.  Post  cardiac cath instructions have been given. Follow up with me in one week.  Follow up with [facility] as scheduled.  Continue  hemodialysis in [facility] on Monday, Wednesday, Friday, as before.   CONDITION AT DISCHARGE:   Stable.   BRIEF HISTORY:  Patricia [name] is 77 year old white female with past medical  history significant for hypothyroidism, history of bronchial asthma,  depression, end stage renal disease on hemodialysis, history of allergic  rhinitis.  She came to the ER complaining of retrosternal chest pressure  radiating to the neck and shoulder, rated at 8/10, associated with mild  shortness of breath, diaphoresis, and nausea, which woke her up around 1:00  a.m. She denies any palpitation, light-headedness or syncope.  The EKG done  in the ER showed normal sinus rhythm with minimal ST elevation in inferior  leads and minimal ST elevation in 1 and V6 and T-wave inversion in aVL,  which were new as compared to prior EKG.  The patient denies any relation of  chest pain to breathing or movement.  She also complains of cough but no  fever or chills.   PAST MEDICAL HISTORY:  As above.   PAST SURGICAL HISTORY:  She had pain brain surgery for benign tumor  approximately ten years ago.   SOCIAL HISTORY:  She is married and has no children.  No history of smoking  or alcohol abuse.  She is a retired [employer].   FAMILY HISTORY:  Noncontributory.   PHYSICAL EXAMINATION:  On examination, she is alert and oriented x3 in no  acute distress.  Blood pressure was 137/67, pulse was 101 sinus tach.  Conjunctivae was pink.  Neck supple, no JVD, no bruit.  Lungs were clear to  auscultation with occasional rhonchi.  There were no rales.  Cardiovascular  exam shows normal S1 and S2.  There was a soft systolic murmur.  There was  no S3 gallop or rub.  Abdomen was soft, bowel sounds were present,  nontender.  Extremities: There is no clubbing, cyanosis or edema.   HOME MEDICATIONS:  These were same as above except the Ultram.   LABORATORY DATA:  Her two sets of cardiac enzymes were normal, CK was 67, MB  1.4, second set CK 73, MB 1.2.  Troponin I was 0.01 and 0.02.  Her sodium  was 139,  potassium 4.2, chloride 102, glucose was 152.  Repeat blood sugar  this morning was in normal range. BUN was 15, creatinine 3.4. Her liver  enzymes were normal.  Magnesium was 2. Repeat sugar blood sugar from this  morning was 87.  Her hemoglobin was 13.7, hematocrit 40.7.  Repeat  hemoglobin this morning was 11.9, hematocrit 35.2.  Repeat hemoglobin this  afternoon is 12.7 with hematocrit 38, which has been stable. Her cholesterol  was 182, LDL was slightly elevated 99, HDL was 53.   BRIEF HOSPITAL COURSE:  The patient was taken emergently to the cath lab and  underwent emergency left cardiac cath and selective left and right coronary  angiography as per procedure report.  The patient tolerated the procedure  well.  There were no complications. Postprocedure, the patient did not have  any anginal chest pain during the hospital stay.  Nephrology consultation  was obtained with [facility]. The patient subsequently  underwent hemodialysis this afternoon.  The patient tolerated dialysis well,  had one episode of hypotension which resolved spontaneously.  The patient  will be discharged home on the above medications and will be followed up in  the office in one week and [place] as scheduled.           ______________________________  Allegra Lai Terrence Dupont, M.D.     MNH/MEDQ  D:  05/24/2006  T:  05/25/2006  Job:  [id number]   cc:   [facility]

## 2011-04-10 ENCOUNTER — Ambulatory Visit: Payer: Medicare Other | Attending: Specialist | Admitting: Physical Therapy

## 2011-04-10 DIAGNOSIS — IMO0001 Reserved for inherently not codable concepts without codable children: Secondary | ICD-10-CM | POA: Insufficient documentation

## 2011-04-10 DIAGNOSIS — R269 Unspecified abnormalities of gait and mobility: Secondary | ICD-10-CM | POA: Insufficient documentation

## 2011-04-10 DIAGNOSIS — M25569 Pain in unspecified knee: Secondary | ICD-10-CM | POA: Insufficient documentation

## 2011-04-12 ENCOUNTER — Ambulatory Visit: Payer: Medicare Other | Admitting: Physical Therapy

## 2011-04-17 ENCOUNTER — Ambulatory Visit: Payer: Medicare Other | Attending: Specialist | Admitting: Physical Therapy

## 2011-04-17 DIAGNOSIS — R269 Unspecified abnormalities of gait and mobility: Secondary | ICD-10-CM | POA: Insufficient documentation

## 2011-04-17 DIAGNOSIS — IMO0001 Reserved for inherently not codable concepts without codable children: Secondary | ICD-10-CM | POA: Insufficient documentation

## 2011-04-17 DIAGNOSIS — M25569 Pain in unspecified knee: Secondary | ICD-10-CM | POA: Insufficient documentation

## 2011-04-19 ENCOUNTER — Ambulatory Visit: Payer: Medicare Other | Admitting: Physical Therapy

## 2011-04-24 ENCOUNTER — Ambulatory Visit: Payer: Medicare Other | Admitting: Physical Therapy

## 2011-04-26 ENCOUNTER — Ambulatory Visit: Payer: Medicare Other | Admitting: Physical Therapy

## 2011-05-02 ENCOUNTER — Ambulatory Visit: Payer: Medicare Other | Admitting: Physical Therapy

## 2011-05-04 ENCOUNTER — Ambulatory Visit: Payer: Medicare Other | Admitting: Physical Therapy

## 2011-05-10 ENCOUNTER — Ambulatory Visit: Payer: Medicare Other | Admitting: Physical Therapy

## 2011-05-11 ENCOUNTER — Ambulatory Visit: Payer: Medicare Other | Admitting: Physical Therapy

## 2011-05-14 ENCOUNTER — Ambulatory Visit: Payer: Medicare Other | Admitting: Physical Therapy

## 2011-05-16 ENCOUNTER — Ambulatory Visit: Payer: Medicare Other | Attending: Specialist | Admitting: Physical Therapy

## 2011-05-16 DIAGNOSIS — R269 Unspecified abnormalities of gait and mobility: Secondary | ICD-10-CM | POA: Insufficient documentation

## 2011-05-16 DIAGNOSIS — IMO0001 Reserved for inherently not codable concepts without codable children: Secondary | ICD-10-CM | POA: Insufficient documentation

## 2011-05-16 DIAGNOSIS — M25569 Pain in unspecified knee: Secondary | ICD-10-CM | POA: Insufficient documentation

## 2011-05-22 ENCOUNTER — Ambulatory Visit: Payer: Medicare Other | Admitting: Physical Therapy

## 2011-05-24 ENCOUNTER — Ambulatory Visit: Payer: Medicare Other | Admitting: Physical Therapy

## 2011-05-29 ENCOUNTER — Encounter: Payer: Medicare Other | Admitting: Physical Therapy

## 2011-05-31 ENCOUNTER — Ambulatory Visit: Payer: Medicare Other | Admitting: Physical Therapy

## 2011-06-05 ENCOUNTER — Ambulatory Visit: Payer: Medicare Other | Admitting: Physical Therapy

## 2011-06-08 ENCOUNTER — Ambulatory Visit: Payer: Medicare Other | Admitting: Physical Therapy

## 2011-06-13 ENCOUNTER — Ambulatory Visit: Payer: Medicare Other | Admitting: Physical Therapy

## 2011-06-14 ENCOUNTER — Ambulatory Visit: Payer: Medicare Other | Admitting: Physical Therapy

## 2011-06-19 ENCOUNTER — Ambulatory Visit: Payer: Medicare Other | Attending: Specialist | Admitting: Physical Therapy

## 2011-06-19 DIAGNOSIS — R269 Unspecified abnormalities of gait and mobility: Secondary | ICD-10-CM | POA: Insufficient documentation

## 2011-06-19 DIAGNOSIS — IMO0001 Reserved for inherently not codable concepts without codable children: Secondary | ICD-10-CM | POA: Insufficient documentation

## 2011-06-19 DIAGNOSIS — M25569 Pain in unspecified knee: Secondary | ICD-10-CM | POA: Insufficient documentation

## 2011-06-20 ENCOUNTER — Ambulatory Visit: Payer: Medicare Other | Admitting: Physical Therapy

## 2011-06-26 ENCOUNTER — Ambulatory Visit: Payer: Medicare Other | Admitting: Physical Therapy

## 2011-06-28 ENCOUNTER — Ambulatory Visit: Payer: Medicare Other | Admitting: Physical Therapy

## 2011-07-10 ENCOUNTER — Ambulatory Visit: Payer: Medicare Other | Admitting: Physical Therapy

## 2011-07-12 ENCOUNTER — Encounter: Payer: Medicare Other | Admitting: Physical Therapy

## 2011-07-27 LAB — COMPREHENSIVE METABOLIC PANEL
ALT: 10
Alkaline Phosphatase: 41
BUN: 34 — ABNORMAL HIGH
CO2: 26
Chloride: 104
GFR calc non Af Amer: 11 — ABNORMAL LOW
Glucose, Bld: 109 — ABNORMAL HIGH
Potassium: 4
Sodium: 139
Total Bilirubin: 0.7

## 2011-07-27 LAB — ELECTROLYTE PANEL
CO2: 23
Chloride: 105

## 2011-07-27 LAB — CBC
HCT: 37.9
Hemoglobin: 12.9
RBC: 4.15
RDW: 15.4 — ABNORMAL HIGH

## 2011-07-30 LAB — POCT I-STAT 4, (NA,K, GLUC, HGB,HCT)
HCT: 44
Operator id: 181601
Sodium: 136

## 2011-11-13 ENCOUNTER — Other Ambulatory Visit: Payer: Self-pay | Admitting: Dermatology

## 2011-11-26 ENCOUNTER — Other Ambulatory Visit: Payer: Self-pay | Admitting: Ophthalmology

## 2012-01-18 ENCOUNTER — Other Ambulatory Visit: Payer: Self-pay | Admitting: Obstetrics and Gynecology

## 2012-01-18 ENCOUNTER — Other Ambulatory Visit (HOSPITAL_COMMUNITY)
Admission: RE | Admit: 2012-01-18 | Discharge: 2012-01-18 | Disposition: A | Discharge status: Home / Self Care | Payer: Medicare Other | Source: Ambulatory Visit | Attending: Obstetrics and Gynecology | Admitting: Obstetrics and Gynecology

## 2012-01-18 ENCOUNTER — Ambulatory Visit
Admission: RE | Admit: 2012-01-18 | Discharge: 2012-01-18 | Disposition: A | Discharge status: Home / Self Care | Payer: Medicare Other | Source: Ambulatory Visit | Attending: Obstetrics and Gynecology | Admitting: Obstetrics and Gynecology

## 2012-01-18 DIAGNOSIS — Z1231 Encounter for screening mammogram for malignant neoplasm of breast: Secondary | ICD-10-CM

## 2012-01-18 DIAGNOSIS — Z1159 Encounter for screening for other viral diseases: Secondary | ICD-10-CM | POA: Insufficient documentation

## 2012-01-18 DIAGNOSIS — Z124 Encounter for screening for malignant neoplasm of cervix: Secondary | ICD-10-CM | POA: Insufficient documentation

## 2012-04-23 ENCOUNTER — Emergency Department (HOSPITAL_BASED_OUTPATIENT_CLINIC_OR_DEPARTMENT_OTHER)
Admission: EM | Admit: 2012-04-23 | Discharge: 2012-04-23 | Disposition: A | Discharge status: Home / Self Care | Payer: No Typology Code available for payment source | Attending: Emergency Medicine | Admitting: Emergency Medicine

## 2012-04-23 ENCOUNTER — Emergency Department (HOSPITAL_BASED_OUTPATIENT_CLINIC_OR_DEPARTMENT_OTHER): Payer: No Typology Code available for payment source

## 2012-04-23 ENCOUNTER — Encounter (HOSPITAL_BASED_OUTPATIENT_CLINIC_OR_DEPARTMENT_OTHER): Payer: Self-pay | Admitting: Radiology

## 2012-04-23 DIAGNOSIS — S20219A Contusion of unspecified front wall of thorax, initial encounter: Secondary | ICD-10-CM

## 2012-04-23 DIAGNOSIS — R109 Unspecified abdominal pain: Secondary | ICD-10-CM | POA: Insufficient documentation

## 2012-04-23 DIAGNOSIS — Y92410 Unspecified street and highway as the place of occurrence of the external cause: Secondary | ICD-10-CM | POA: Insufficient documentation

## 2012-04-23 DIAGNOSIS — E079 Disorder of thyroid, unspecified: Secondary | ICD-10-CM | POA: Insufficient documentation

## 2012-04-23 DIAGNOSIS — Z940 Kidney transplant status: Secondary | ICD-10-CM | POA: Insufficient documentation

## 2012-04-23 DIAGNOSIS — G319 Degenerative disease of nervous system, unspecified: Secondary | ICD-10-CM | POA: Insufficient documentation

## 2012-04-23 DIAGNOSIS — Z79899 Other long term (current) drug therapy: Secondary | ICD-10-CM | POA: Insufficient documentation

## 2012-04-23 DIAGNOSIS — R0602 Shortness of breath: Secondary | ICD-10-CM | POA: Insufficient documentation

## 2012-04-23 DIAGNOSIS — I2510 Atherosclerotic heart disease of native coronary artery without angina pectoris: Secondary | ICD-10-CM | POA: Insufficient documentation

## 2012-04-23 DIAGNOSIS — I10 Essential (primary) hypertension: Secondary | ICD-10-CM | POA: Insufficient documentation

## 2012-04-23 HISTORY — DX: Disorder of kidney and ureter, unspecified: N28.9

## 2012-04-23 HISTORY — DX: Atherosclerotic heart disease of native coronary artery without angina pectoris: I25.10

## 2012-04-23 HISTORY — DX: Allergy status to unspecified drugs, medicaments and biological substances: Z88.9

## 2012-04-23 HISTORY — DX: Kidney transplant status: Z94.0

## 2012-04-23 HISTORY — DX: Disorder of thyroid, unspecified: E07.9

## 2012-04-23 HISTORY — DX: Essential (primary) hypertension: I10

## 2012-04-23 LAB — URINALYSIS, ROUTINE W REFLEX MICROSCOPIC
Glucose, UA: NEGATIVE mg/dL
Hgb urine dipstick: NEGATIVE
Leukocytes, UA: NEGATIVE
Protein, ur: NEGATIVE mg/dL
Specific Gravity, Urine: 1.018 (ref 1.005–1.030)

## 2012-04-23 LAB — COMPREHENSIVE METABOLIC PANEL
AST: 39 U/L — ABNORMAL HIGH (ref 0–37)
Albumin: 4.1 g/dL (ref 3.5–5.2)
BUN: 27 mg/dL — ABNORMAL HIGH (ref 6–23)
Calcium: 10.8 mg/dL — ABNORMAL HIGH (ref 8.4–10.5)
Creatinine, Ser: 1.5 mg/dL — ABNORMAL HIGH (ref 0.50–1.10)
Total Bilirubin: 0.5 mg/dL (ref 0.3–1.2)
Total Protein: 7.4 g/dL (ref 6.0–8.3)

## 2012-04-23 LAB — CBC WITH DIFFERENTIAL/PLATELET
Basophils Absolute: 0 10*3/uL (ref 0.0–0.1)
Basophils Relative: 0 % (ref 0–1)
Eosinophils Absolute: 0 10*3/uL (ref 0.0–0.7)
HCT: 39.8 % (ref 36.0–46.0)
Hemoglobin: 13.7 g/dL (ref 12.0–15.0)
Lymphs Abs: 2.4 10*3/uL (ref 0.7–4.0)
MCH: 32.2 pg (ref 26.0–34.0)
MCHC: 34.4 g/dL (ref 30.0–36.0)
Monocytes Absolute: 0.7 10*3/uL (ref 0.1–1.0)
Neutro Abs: 7.3 10*3/uL (ref 1.7–7.7)
RDW: 13.9 % (ref 11.5–15.5)

## 2012-04-23 LAB — CARDIAC PANEL(CRET KIN+CKTOT+MB+TROPI)
CK, MB: 4.4 ng/mL — ABNORMAL HIGH (ref 0.3–4.0)
Relative Index: 2.5 (ref 0.0–2.5)
Total CK: 176 U/L (ref 7–177)

## 2012-04-23 MED ORDER — OXYCODONE-ACETAMINOPHEN 5-325 MG PO TABS
2.0000 | ORAL_TABLET | Freq: Once | ORAL | Status: AC
  Administered 2012-04-23: 2 via ORAL
  Filled 2012-04-23: qty 2

## 2012-04-23 MED ORDER — ONDANSETRON HCL 4 MG/2ML IJ SOLN
4.0000 mg | Freq: Once | INTRAMUSCULAR | Status: AC
  Administered 2012-04-23: 4 mg via INTRAVENOUS

## 2012-04-23 MED ORDER — MORPHINE SULFATE 4 MG/ML IJ SOLN
4.0000 mg | Freq: Once | INTRAMUSCULAR | Status: AC
  Administered 2012-04-23: 4 mg via INTRAVENOUS

## 2012-04-23 MED ORDER — ONDANSETRON HCL 4 MG/2ML IJ SOLN
INTRAMUSCULAR | Status: AC
  Administered 2012-04-23: 4 mg via INTRAVENOUS
  Filled 2012-04-23: qty 2

## 2012-04-23 MED ORDER — MORPHINE SULFATE 4 MG/ML IJ SOLN
INTRAMUSCULAR | Status: AC
  Administered 2012-04-23: 4 mg via INTRAVENOUS
  Filled 2012-04-23: qty 1

## 2012-04-23 MED ORDER — IOHEXOL 300 MG/ML  SOLN
50.0000 mL | Freq: Once | INTRAMUSCULAR | Status: AC | PRN
  Administered 2012-04-23: 50 mL via INTRAVENOUS

## 2012-04-23 MED ORDER — MORPHINE SULFATE 4 MG/ML IJ SOLN
4.0000 mg | Freq: Once | INTRAMUSCULAR | Status: AC
  Administered 2012-04-23: 4 mg via INTRAVENOUS
  Filled 2012-04-23: qty 1

## 2012-04-23 MED ORDER — ONDANSETRON HCL 4 MG PO TABS: 4.0000 mg | ORAL_TABLET | Freq: Four times a day (QID) | ORAL | Status: AC

## 2012-04-23 MED ORDER — OXYCODONE-ACETAMINOPHEN 5-325 MG PO TABS: 2.0000 | ORAL_TABLET | ORAL | Status: AC | PRN

## 2012-04-23 NOTE — Discharge Instructions (Signed)
Chest Contusion You have been checked for injuries to your chest. Your caregiver has not found injuries serious enough to require hospitalization. It is common to have bruises and sore muscles after an injury. These tend to feel worse the first 24 hours. You may gradually develop more stiffness and soreness over the next several hours to several days. This usually feels worse the first morning following your injury. After a few days, you will usually begin to improve. The amount of improvement depends on the amount of damage. Following the accident, if the pain in any area continues to increase or you develop new areas of pain, you should see your primary caregiver or return to the Emergency Department for re-evaluation. HOME CARE INSTRUCTIONS   Put ice on sore areas every 2 hours for 20 minutes while awake for the next 2 days.   Drink extra fluids. Do not drink alcohol.   Activity as tolerated. Lifting may make pain worse.   Only take over-the-counter or prescription medicines for pain, discomfort, or fever as directed by your caregiver. Do not use aspirin. This may increase bruising or increase bleeding.  SEEK IMMEDIATE MEDICAL CARE IF:   There is a worsening of any of the problems that brought you in for care.   Shortness of breath, dizziness or fainting develop.   You have chest pain, difficulty breathing, or develop pain going down the left arm or up into jaw.   You feel sick to your stomach (nausea), vomiting or sweats.   You have increasing belly (abdominal) discomfort.   There is blood in your urine, stool, or if you vomit blood.   There is pain in either shoulder in an area where a shoulder strap would be.   You have feelings of lightheadedness, or if you should have a fainting episode.   You have numbness, tingling, weakness, or problems with the use of your arms or legs.   Severe headaches not relieved with medications develop.   You have a change in bowel or bladder  control.   There is increasing pain in any areas of the body.  If you feel your symptoms are worsening, and you are not able to see your primary caregiver, return to the Emergency Department immediately. MAKE SURE YOU:   Understand these instructions.   Will watch your condition.   Will get help right away if you are not doing well or get worse.  Document Released: 06/26/2001 Document Revised: 09/20/2011 Document Reviewed: 05/19/2008 [employer] Patient Information 2012 [employer]. 
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
## 2012-06-11 ENCOUNTER — Other Ambulatory Visit: Payer: Self-pay | Admitting: Dermatology

## 2013-02-17 ENCOUNTER — Ambulatory Visit (INDEPENDENT_AMBULATORY_CARE_PROVIDER_SITE_OTHER): Payer: Medicare Other | Admitting: Neurology

## 2013-02-17 ENCOUNTER — Encounter: Payer: Self-pay | Admitting: Neurology

## 2013-02-17 VITALS — BP 158/75 | HR 58 | Ht 65.0 in | Wt 183.0 lb

## 2013-02-17 DIAGNOSIS — Z889 Allergy status to unspecified drugs, medicaments and biological substances status: Secondary | ICD-10-CM | POA: Insufficient documentation

## 2013-02-17 DIAGNOSIS — K5792 Diverticulitis of intestine, part unspecified, without perforation or abscess without bleeding: Secondary | ICD-10-CM

## 2013-02-17 DIAGNOSIS — F329 Major depressive disorder, single episode, unspecified: Secondary | ICD-10-CM

## 2013-02-17 DIAGNOSIS — E079 Disorder of thyroid, unspecified: Secondary | ICD-10-CM

## 2013-02-17 DIAGNOSIS — E119 Type 2 diabetes mellitus without complications: Secondary | ICD-10-CM | POA: Insufficient documentation

## 2013-02-17 DIAGNOSIS — N289 Disorder of kidney and ureter, unspecified: Secondary | ICD-10-CM

## 2013-02-17 DIAGNOSIS — I10 Essential (primary) hypertension: Secondary | ICD-10-CM | POA: Insufficient documentation

## 2013-02-17 DIAGNOSIS — R269 Unspecified abnormalities of gait and mobility: Secondary | ICD-10-CM | POA: Insufficient documentation

## 2013-02-17 DIAGNOSIS — N184 Chronic kidney disease, stage 4 (severe): Secondary | ICD-10-CM | POA: Insufficient documentation

## 2013-02-17 DIAGNOSIS — Z940 Kidney transplant status: Secondary | ICD-10-CM | POA: Insufficient documentation

## 2013-02-17 DIAGNOSIS — E039 Hypothyroidism, unspecified: Secondary | ICD-10-CM

## 2013-02-17 DIAGNOSIS — F419 Anxiety disorder, unspecified: Secondary | ICD-10-CM

## 2013-02-17 DIAGNOSIS — J449 Chronic obstructive pulmonary disease, unspecified: Secondary | ICD-10-CM | POA: Insufficient documentation

## 2013-02-17 DIAGNOSIS — R42 Dizziness and giddiness: Secondary | ICD-10-CM

## 2013-02-17 NOTE — Progress Notes (Signed)
History: Patricia [name] is a 79 years old right-handed Caucasian female, accompanied by her husband, referred by her primary care physician Dr. Felipa Eth for evaluation of worsening gait difficulty  She had a past medical history of end-stage renal disease, etiology unknown, was on dialysis from January 2005, to November 2010, she had a kidney transplant at [facility], is currently taking cyclosporin, Imuran, she also had a history of hypertension, hyperlipidemia, osteopenia, she also has a history of left posterior fossa meningioma, status post reresection in 2002 by Dr. Nicholes Calamity  She had one seizure post recovery from her kidney transplant around November 2010, husband stated it was due to high dose of cyclosporin, she stayed at intensive care for 2 weeks, require intubation, prolonged rehabilitation, since then, she had rapid decline in her functional status, has been using  walker since then,  She had worsening gait difficulty since January 2014, require help in dressing, bathing, worsening bladder incontinence over the past 6 months as well, she complains of bilateral lower extremity weakness, right worse than left, also complained of right knee pain, swelling, there was interruption of her right leg blood circulation after her kidney transplant surgery   There was also decreased energy, she sits down watching TV or reading most of the time, she denied numbness tingling of her bilateral lower extremity, denied visual change, denied bilateral upper extremity motor or sensory deficit, she denies significant headaches, neck pain,   Review of Systems  Out of a complete 14 system review, the patient complains of only the following symptoms, and all other reviewed systems are negative.   Constitutional:   N/A Cardiovascular:  Swelling in her leg Ear/Nose/Throat:  N/A Skin: Rash, itching Eyes: N/A Respiratory: shortness of breath Gastroitestinal: N/A    Hematology/Lymphatic:  N/A Endocrine:   N/A Musculoskeletal: Joint pain, joint swelling Allergy/Immunology: N/A Neurological: N/A Psychiatric:    Too much sleep, decreased energy, disinterested in activities.  PHYSICAL EXAMINATOINS:  Generalized: In no acute distress  Neck: Supple, no carotid bruits   Cardiac: Regular rate rhythm  Pulmonary: Clear to auscultation bilaterally  Musculoskeletal: No deformity  Neurological examination  Mentation: Alert oriented to time, place, history taking, and causual conversation, sitting in wheelchair  Cranial nerve II-XII: Pupils were equal round reactive to light extraocular movements were full, visual field were full on confrontational test. facial sensation and strength were normal, static left ptosis to the upper edge of left pupil.  Uvula tongue midline.  head turning and shoulder shrug and were normal and symmetric.Tongue protrusion into cheek strength was normal.  Motor: right knee pain, swelling, tender on deep palpitation, moderate right hip flexion weakness, mild right knee extension weakness, normal bilateral upper extremity strength, bilateral ankle dorsiflexion.  Sensory: Intact to fine touch, pinprick, preserved vibratory sensation, and proprioception at toes.  Coordination: Normal finger to nose,  there was no truncal ataxia  Gait: need two people assistant to get up from seated position, cautious, dragging right leg, difficulty initiate gait    Deep tendon reflexes: Brachioradialis 3/3, biceps 3/3, triceps 3/3, patellar 1/2, Achilles 1/1, plantar responses were flexor on the left, extensor on right.  Assessment and plan: 79 years old right-handed Caucasian female, with past medical history of left posterior fossa meningioma resection, kidney transplant, chronic immunosuppressive treatment, now presenting with few years history of progressive worsening gait difficulty, getting worse over past 6 months, urinary incontinence, right knee pain, hyperreflexia on bilateral  upper extremity, mild to moderate right leg weakness, which could due to right  knee pain.  1. not sure of the exact etiology of her gait difficulty, potential differentiation diagnosis including cervical spondylitic myelopathy, intracranial abnormality, in combination with her right knee pain, deconditioning 2 home physical therapy 3 Complete evaluation with MRI of brain MRI cervical .

## 2013-02-19 ENCOUNTER — Other Ambulatory Visit: Payer: Self-pay | Admitting: Dermatology

## 2013-02-27 ENCOUNTER — Ambulatory Visit: Payer: Medicare Other | Attending: Neurology | Admitting: Rehabilitative and Restorative Service Providers"

## 2013-02-27 DIAGNOSIS — M6281 Muscle weakness (generalized): Secondary | ICD-10-CM | POA: Insufficient documentation

## 2013-02-27 DIAGNOSIS — R269 Unspecified abnormalities of gait and mobility: Secondary | ICD-10-CM | POA: Insufficient documentation

## 2013-02-27 DIAGNOSIS — IMO0001 Reserved for inherently not codable concepts without codable children: Secondary | ICD-10-CM | POA: Insufficient documentation

## 2013-03-04 ENCOUNTER — Ambulatory Visit: Payer: Medicare Other | Admitting: Physical Therapy

## 2013-03-05 ENCOUNTER — Ambulatory Visit: Payer: Medicare Other | Admitting: Physical Therapy

## 2013-03-11 ENCOUNTER — Ambulatory Visit
Admission: RE | Admit: 2013-03-11 | Discharge: 2013-03-11 | Disposition: A | Discharge status: Home / Self Care | Payer: 59 | Source: Ambulatory Visit | Attending: Neurology | Admitting: Neurology

## 2013-03-11 ENCOUNTER — Ambulatory Visit: Payer: Medicare Other | Admitting: Physical Therapy

## 2013-03-11 ENCOUNTER — Other Ambulatory Visit (HOSPITAL_COMMUNITY): Payer: Self-pay | Admitting: Geriatric Medicine

## 2013-03-11 DIAGNOSIS — E119 Type 2 diabetes mellitus without complications: Secondary | ICD-10-CM

## 2013-03-11 DIAGNOSIS — R42 Dizziness and giddiness: Secondary | ICD-10-CM

## 2013-03-11 DIAGNOSIS — E079 Disorder of thyroid, unspecified: Secondary | ICD-10-CM

## 2013-03-11 DIAGNOSIS — R269 Unspecified abnormalities of gait and mobility: Secondary | ICD-10-CM

## 2013-03-11 DIAGNOSIS — K5792 Diverticulitis of intestine, part unspecified, without perforation or abscess without bleeding: Secondary | ICD-10-CM

## 2013-03-11 DIAGNOSIS — Z940 Kidney transplant status: Secondary | ICD-10-CM

## 2013-03-11 DIAGNOSIS — E039 Hypothyroidism, unspecified: Secondary | ICD-10-CM

## 2013-03-11 DIAGNOSIS — N289 Disorder of kidney and ureter, unspecified: Secondary | ICD-10-CM

## 2013-03-11 DIAGNOSIS — Z889 Allergy status to unspecified drugs, medicaments and biological substances status: Secondary | ICD-10-CM

## 2013-03-11 DIAGNOSIS — F329 Major depressive disorder, single episode, unspecified: Secondary | ICD-10-CM

## 2013-03-11 DIAGNOSIS — I10 Essential (primary) hypertension: Secondary | ICD-10-CM

## 2013-03-11 DIAGNOSIS — F419 Anxiety disorder, unspecified: Secondary | ICD-10-CM

## 2013-03-11 DIAGNOSIS — Z1231 Encounter for screening mammogram for malignant neoplasm of breast: Secondary | ICD-10-CM

## 2013-03-11 DIAGNOSIS — J449 Chronic obstructive pulmonary disease, unspecified: Secondary | ICD-10-CM

## 2013-03-12 ENCOUNTER — Ambulatory Visit: Payer: Medicare Other | Admitting: Physical Therapy

## 2013-03-17 ENCOUNTER — Ambulatory Visit: Payer: Medicare Other | Attending: Neurology | Admitting: Physical Therapy

## 2013-03-17 DIAGNOSIS — IMO0001 Reserved for inherently not codable concepts without codable children: Secondary | ICD-10-CM | POA: Insufficient documentation

## 2013-03-17 DIAGNOSIS — M6281 Muscle weakness (generalized): Secondary | ICD-10-CM | POA: Insufficient documentation

## 2013-03-17 DIAGNOSIS — R269 Unspecified abnormalities of gait and mobility: Secondary | ICD-10-CM | POA: Insufficient documentation

## 2013-03-19 ENCOUNTER — Ambulatory Visit: Payer: Medicare Other | Admitting: Physical Therapy

## 2013-03-20 ENCOUNTER — Ambulatory Visit (HOSPITAL_COMMUNITY): Payer: Medicare Other | Attending: Geriatric Medicine

## 2013-03-20 NOTE — Progress Notes (Signed)
Quick Note:  I have called her, MRI brain showed post -surgical changes, mild DJD on MRI cervical. She is receiving physical therapy, has good day bad days.  No change in treatment plan. ______

## 2013-03-24 ENCOUNTER — Ambulatory Visit: Payer: Medicare Other | Admitting: Physical Therapy

## 2013-03-25 ENCOUNTER — Ambulatory Visit (HOSPITAL_COMMUNITY)
Admission: RE | Admit: 2013-03-25 | Discharge: 2013-03-25 | Disposition: A | Discharge status: Home / Self Care | Payer: Medicare Other | Source: Ambulatory Visit | Attending: Geriatric Medicine | Admitting: Geriatric Medicine

## 2013-03-25 DIAGNOSIS — Z1231 Encounter for screening mammogram for malignant neoplasm of breast: Secondary | ICD-10-CM | POA: Insufficient documentation

## 2013-03-26 ENCOUNTER — Ambulatory Visit: Payer: Medicare Other | Admitting: Physical Therapy

## 2013-03-31 ENCOUNTER — Ambulatory Visit: Payer: Medicare Other | Admitting: Physical Therapy

## 2013-04-02 ENCOUNTER — Ambulatory Visit: Payer: Medicare Other | Admitting: Physical Therapy

## 2013-04-14 ENCOUNTER — Ambulatory Visit: Payer: Medicare Other | Attending: Neurology | Admitting: Rehabilitative and Restorative Service Providers"

## 2013-04-14 DIAGNOSIS — R269 Unspecified abnormalities of gait and mobility: Secondary | ICD-10-CM | POA: Insufficient documentation

## 2013-04-14 DIAGNOSIS — IMO0001 Reserved for inherently not codable concepts without codable children: Secondary | ICD-10-CM | POA: Insufficient documentation

## 2013-04-14 DIAGNOSIS — M6281 Muscle weakness (generalized): Secondary | ICD-10-CM | POA: Insufficient documentation

## 2013-04-16 ENCOUNTER — Ambulatory Visit: Payer: Medicare Other | Admitting: Physical Therapy

## 2013-04-20 ENCOUNTER — Ambulatory Visit: Payer: Medicare Other | Admitting: Rehabilitative and Restorative Service Providers"

## 2013-04-21 ENCOUNTER — Ambulatory Visit: Payer: Medicare Other | Admitting: Rehabilitative and Restorative Service Providers"

## 2013-04-23 ENCOUNTER — Ambulatory Visit: Payer: Medicare Other | Admitting: Rehabilitative and Restorative Service Providers"

## 2013-08-06 ENCOUNTER — Ambulatory Visit: Payer: Medicare Other | Admitting: Neurology

## 2013-08-14 ENCOUNTER — Encounter (INDEPENDENT_AMBULATORY_CARE_PROVIDER_SITE_OTHER): Payer: Self-pay

## 2013-08-14 ENCOUNTER — Ambulatory Visit (INDEPENDENT_AMBULATORY_CARE_PROVIDER_SITE_OTHER): Payer: Medicare Other | Admitting: Neurology

## 2013-08-14 ENCOUNTER — Ambulatory Visit: Payer: Medicare Other | Admitting: Neurology

## 2013-08-14 ENCOUNTER — Encounter: Payer: Self-pay | Admitting: Neurology

## 2013-08-14 VITALS — BP 159/78 | HR 61

## 2013-08-14 DIAGNOSIS — J449 Chronic obstructive pulmonary disease, unspecified: Secondary | ICD-10-CM

## 2013-08-14 DIAGNOSIS — N289 Disorder of kidney and ureter, unspecified: Secondary | ICD-10-CM

## 2013-08-14 DIAGNOSIS — F419 Anxiety disorder, unspecified: Secondary | ICD-10-CM

## 2013-08-14 DIAGNOSIS — J4489 Other specified chronic obstructive pulmonary disease: Secondary | ICD-10-CM

## 2013-08-14 DIAGNOSIS — Z940 Kidney transplant status: Secondary | ICD-10-CM

## 2013-08-14 DIAGNOSIS — F411 Generalized anxiety disorder: Secondary | ICD-10-CM

## 2013-08-14 DIAGNOSIS — R42 Dizziness and giddiness: Secondary | ICD-10-CM

## 2013-08-14 DIAGNOSIS — F329 Major depressive disorder, single episode, unspecified: Secondary | ICD-10-CM

## 2013-08-14 MED ORDER — DONEPEZIL HCL 10 MG PO TABS: 10.0000 mg | ORAL_TABLET | Freq: Every evening | ORAL | Status: DC | PRN
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
## 2013-08-24 ENCOUNTER — Other Ambulatory Visit: Payer: Self-pay | Admitting: Dermatology

## 2013-09-04 ENCOUNTER — Encounter (HOSPITAL_COMMUNITY): Payer: Self-pay | Admitting: Emergency Medicine

## 2013-09-04 ENCOUNTER — Emergency Department (HOSPITAL_COMMUNITY): Payer: Medicare Other

## 2013-09-04 ENCOUNTER — Emergency Department (HOSPITAL_COMMUNITY)
Admission: EM | Admit: 2013-09-04 | Discharge: 2013-09-04 | Disposition: A | Discharge status: Home / Self Care | Payer: Medicare Other | Attending: Emergency Medicine | Admitting: Emergency Medicine

## 2013-09-04 DIAGNOSIS — Z888 Allergy status to other drugs, medicaments and biological substances status: Secondary | ICD-10-CM | POA: Insufficient documentation

## 2013-09-04 DIAGNOSIS — F329 Major depressive disorder, single episode, unspecified: Secondary | ICD-10-CM | POA: Insufficient documentation

## 2013-09-04 DIAGNOSIS — Z880 Allergy status to penicillin: Secondary | ICD-10-CM | POA: Insufficient documentation

## 2013-09-04 DIAGNOSIS — I10 Essential (primary) hypertension: Secondary | ICD-10-CM | POA: Insufficient documentation

## 2013-09-04 DIAGNOSIS — Z8719 Personal history of other diseases of the digestive system: Secondary | ICD-10-CM | POA: Insufficient documentation

## 2013-09-04 DIAGNOSIS — K219 Gastro-esophageal reflux disease without esophagitis: Secondary | ICD-10-CM | POA: Insufficient documentation

## 2013-09-04 DIAGNOSIS — Z881 Allergy status to other antibiotic agents status: Secondary | ICD-10-CM | POA: Insufficient documentation

## 2013-09-04 DIAGNOSIS — E860 Dehydration: Secondary | ICD-10-CM | POA: Insufficient documentation

## 2013-09-04 DIAGNOSIS — F411 Generalized anxiety disorder: Secondary | ICD-10-CM | POA: Insufficient documentation

## 2013-09-04 DIAGNOSIS — M6281 Muscle weakness (generalized): Secondary | ICD-10-CM | POA: Insufficient documentation

## 2013-09-04 DIAGNOSIS — F3289 Other specified depressive episodes: Secondary | ICD-10-CM | POA: Insufficient documentation

## 2013-09-04 DIAGNOSIS — R112 Nausea with vomiting, unspecified: Secondary | ICD-10-CM | POA: Insufficient documentation

## 2013-09-04 DIAGNOSIS — E119 Type 2 diabetes mellitus without complications: Secondary | ICD-10-CM | POA: Insufficient documentation

## 2013-09-04 DIAGNOSIS — I2510 Atherosclerotic heart disease of native coronary artery without angina pectoris: Secondary | ICD-10-CM | POA: Insufficient documentation

## 2013-09-04 DIAGNOSIS — D72829 Elevated white blood cell count, unspecified: Secondary | ICD-10-CM | POA: Insufficient documentation

## 2013-09-04 DIAGNOSIS — J4489 Other specified chronic obstructive pulmonary disease: Secondary | ICD-10-CM | POA: Insufficient documentation

## 2013-09-04 DIAGNOSIS — Z940 Kidney transplant status: Secondary | ICD-10-CM | POA: Insufficient documentation

## 2013-09-04 DIAGNOSIS — E079 Disorder of thyroid, unspecified: Secondary | ICD-10-CM | POA: Insufficient documentation

## 2013-09-04 DIAGNOSIS — E876 Hypokalemia: Secondary | ICD-10-CM | POA: Insufficient documentation

## 2013-09-04 DIAGNOSIS — E039 Hypothyroidism, unspecified: Secondary | ICD-10-CM | POA: Insufficient documentation

## 2013-09-04 DIAGNOSIS — R1110 Vomiting, unspecified: Secondary | ICD-10-CM

## 2013-09-04 DIAGNOSIS — Z79899 Other long term (current) drug therapy: Secondary | ICD-10-CM | POA: Insufficient documentation

## 2013-09-04 DIAGNOSIS — Z885 Allergy status to narcotic agent status: Secondary | ICD-10-CM | POA: Insufficient documentation

## 2013-09-04 DIAGNOSIS — R51 Headache: Secondary | ICD-10-CM | POA: Insufficient documentation

## 2013-09-04 DIAGNOSIS — J449 Chronic obstructive pulmonary disease, unspecified: Secondary | ICD-10-CM | POA: Insufficient documentation

## 2013-09-04 DIAGNOSIS — R748 Abnormal levels of other serum enzymes: Secondary | ICD-10-CM | POA: Insufficient documentation

## 2013-09-04 DIAGNOSIS — R630 Anorexia: Secondary | ICD-10-CM | POA: Insufficient documentation

## 2013-09-04 LAB — COMPREHENSIVE METABOLIC PANEL
AST: 33 U/L (ref 0–37)
Albumin: 4 g/dL (ref 3.5–5.2)
Alkaline Phosphatase: 59 U/L (ref 39–117)
BUN: 13 mg/dL (ref 6–23)
CO2: 20 mEq/L (ref 19–32)
Chloride: 104 mEq/L (ref 96–112)
Creatinine, Ser: 1.01 mg/dL (ref 0.50–1.10)
GFR calc non Af Amer: 52 mL/min — ABNORMAL LOW (ref >90)
Potassium: 3.3 mEq/L — ABNORMAL LOW (ref 3.5–5.1)
Total Bilirubin: 0.7 mg/dL (ref 0.3–1.2)

## 2013-09-04 LAB — CBC WITH DIFFERENTIAL/PLATELET
Basophils Relative: 0 % (ref 0–1)
HCT: 47.4 % — ABNORMAL HIGH (ref 36.0–46.0)
Hemoglobin: 16.6 g/dL — ABNORMAL HIGH (ref 12.0–15.0)
Lymphocytes Relative: 19 % (ref 12–46)
MCHC: 35 g/dL (ref 30.0–36.0)
Monocytes Absolute: 0.6 10*3/uL (ref 0.1–1.0)
Monocytes Relative: 7 % (ref 3–12)
Neutro Abs: 6.1 10*3/uL (ref 1.7–7.7)
Neutrophils Relative %: 73 % (ref 43–77)
RBC: 5.13 MIL/uL — ABNORMAL HIGH (ref 3.87–5.11)
WBC: 8.3 10*3/uL (ref 4.0–10.5)

## 2013-09-04 LAB — URINALYSIS, ROUTINE W REFLEX MICROSCOPIC
Glucose, UA: 100 mg/dL — AB
Ketones, ur: 15 mg/dL — AB
Protein, ur: 30 mg/dL — AB
pH: 6 (ref 5.0–8.0)

## 2013-09-04 LAB — URINE MICROSCOPIC-ADD ON

## 2013-09-04 LAB — LIPASE, BLOOD: Lipase: 36 U/L (ref 11–59)

## 2013-09-04 MED ORDER — AZATHIOPRINE 50 MG PO TABS
50.0000 mg | ORAL_TABLET | Freq: Two times a day (BID) | ORAL | Status: DC
  Administered 2013-09-04: 50 mg via ORAL
  Filled 2013-09-04 (×2): qty 1

## 2013-09-04 MED ORDER — ONDANSETRON 4 MG PO TBDP
4.0000 mg | ORAL_TABLET | Freq: Once | ORAL | Status: AC
  Administered 2013-09-04: 4 mg via ORAL
  Filled 2013-09-04: qty 1

## 2013-09-04 MED ORDER — PREDNISOLONE 5 MG PO TABS
5.0000 mg | ORAL_TABLET | Freq: Every day | ORAL | Status: DC
  Filled 2013-09-04: qty 1

## 2013-09-04 MED ORDER — PREDNISONE 5 MG PO TABS
5.0000 mg | ORAL_TABLET | Freq: Every day | ORAL | Status: DC
  Filled 2013-09-04 (×2): qty 1

## 2013-09-04 MED ORDER — PREDNISONE 5 MG PO TABS
5.0000 mg | ORAL_TABLET | Freq: Every day | ORAL | Status: DC
  Administered 2013-09-04: 5 mg via ORAL
  Filled 2013-09-04: qty 1

## 2013-09-04 MED ORDER — ONDANSETRON HCL 4 MG/2ML IJ SOLN
4.0000 mg | Freq: Once | INTRAMUSCULAR | Status: AC
  Administered 2013-09-04: 4 mg via INTRAVENOUS
  Filled 2013-09-04: qty 2

## 2013-09-04 MED ORDER — CYCLOSPORINE 100 MG PO CAPS
100.0000 mg | ORAL_CAPSULE | Freq: Two times a day (BID) | ORAL | Status: DC
Start: 2013-09-04 — End: 2013-09-04
  Administered 2013-09-04: 100 mg via ORAL
  Filled 2013-09-04 (×2): qty 1

## 2013-09-04 MED ORDER — CLONIDINE HCL 0.1 MG PO TABS: 0.1000 mg | ORAL_TABLET | Freq: Once | ORAL | Status: DC

## 2013-09-04 MED ORDER — AMLODIPINE BESYLATE 5 MG PO TABS
5.0000 mg | ORAL_TABLET | Freq: Once | ORAL | Status: AC
  Administered 2013-09-04: 5 mg via ORAL
  Filled 2013-09-04: qty 1

## 2013-09-04 MED ORDER — SODIUM CHLORIDE 0.9 % IV BOLUS (SEPSIS)
1000.0000 mL | Freq: Once | INTRAVENOUS | Status: AC
  Administered 2013-09-04: 1000 mL via INTRAVENOUS

## 2013-09-04 MED ORDER — ONDANSETRON 4 MG PO TBDP: 4.0000 mg | ORAL_TABLET | Freq: Three times a day (TID) | ORAL | Status: DC | PRN

## 2013-09-04 MED ORDER — PREDNISOLONE 5 MG PO TABS: 5.0000 mg | ORAL_TABLET | Freq: Once | ORAL | Status: DC

## 2013-09-04 NOTE — ED Notes (Signed)
Patient states she is unable to give a urine sample and only went a small amount early this morning.

## 2013-09-04 NOTE — ED Notes (Signed)
Patient states she is a little nauseated.

## 2013-09-04 NOTE — ED Provider Notes (Signed)
CSN: [id number]     Arrival date & time 09/04/13  1027 History   First MD Initiated Contact with Patient 09/04/13 1113     Chief Complaint  Patient presents with  . Emesis    hx of kidney transplant   (Consider location/radiation/quality/duration/timing/severity/associated sxs/prior Treatment) Patient is a 79 y.o. female presenting with vomiting.  Emesis Associated symptoms: headaches   Associated symptoms: no abdominal pain, no chills and no sore throat     79 year old female here with 3 days of nausea and vomiting. She states her vomit yellow fluid, without bile or blood. Denies abdominal pain, but has not been able to tolerate by mouth fluid or food for 3 days. She states she has had decreased urination having only urinated one time the last 3 days. She feels generally weak, denies fevers, chills, sweats, sick contacts, or diarrhea. She has had a bowel movement since this began and is still passing gas. She has a history of renal failure and is status post transplant x4 years.  Past Medical History  Diagnosis Date  . Renal disorder   . Hx of kidney transplant     right  . Coronary artery disease   . Hypertension   . Thyroid disease   . Multiple allergies   . Abnormal gait   . Reflux   . Diverticulitis   . Hypothyroidism   . Depression   . Anxiety   . COPD, mild   . Dizziness   . Diabetes    Past Surgical History  Procedure Laterality Date  . Nephrectomy transplanted organ    . Tumor removal      Brain    Family History  Problem Relation Age of Onset  . Alzheimer's disease Mother   . Alcoholism Father   . High Cholesterol Mother    History  Substance Use Topics  . Smoking status: Never Smoker   . Smokeless tobacco: Never Used  . Alcohol Use: No   OB History   Grav Para Term Preterm Abortions TAB SAB Ect Mult Living                 Review of Systems  Constitutional: Negative for fever, chills and appetite change.  HENT: Negative for sore throat.    Respiratory: Negative for cough and shortness of breath.   Cardiovascular: Negative for chest pain.  Gastrointestinal: Positive for nausea and vomiting. Negative for abdominal pain.  Genitourinary: Negative for dysuria.  Neurological: Positive for headaches.  All other systems reviewed and are negative.    Allergies  Aricept; Codeine; Penicillins; Statins; and Tetracyclines & related  Home Medications   Current Outpatient Rx  Name  Route  Sig  Dispense  Refill  . albuterol (PROVENTIL HFA;VENTOLIN HFA) 108 (90 BASE) MCG/ACT inhaler   Inhalation   Inhale 2 puffs into the lungs every 6 (six) hours as needed for wheezing or shortness of breath. Patient uses this medication prn.         Marland Kitchen amLODipine (NORVASC) 5 MG tablet   Oral   Take 5 mg by mouth daily.         Marland Kitchen azaTHIOprine (IMURAN) 50 MG tablet   Oral   Take 50 mg by mouth 2 (two) times daily.          . B Complex-C (B-COMPLEX WITH VITAMIN C) tablet   Oral   Take 1 tablet by mouth daily.         . budesonide-formoterol (SYMBICORT) 160-4.5 MCG/ACT inhaler   Inhalation  Inhale 2 puffs into the lungs 2 (two) times daily.         . Calcium-Magnesium-Vitamin D (CITRACAL CALCIUM+D PO)   Oral   Take 1 tablet by mouth 2 (two) times daily.          . cetirizine (ZYRTEC) 10 MG tablet   Oral   Take 10 mg by mouth daily.         . cloNIDine (CATAPRES) 0.1 MG tablet   Oral   Take 0.1 mg by mouth 2 (two) times daily.         . Coenzyme Q10 (CO Q 10) 100 MG CAPS   Oral   Take 1 tablet by mouth daily.          . colesevelam (WELCHOL) 625 MG tablet   Oral   Take 1,875 mg by mouth 2 (two) times daily with a meal.         . cycloSPORINE (SANDIMMUNE) 100 MG capsule   Oral   Take 100 mg by mouth 2 (two) times daily.         . ENABLEX 7.5 MG 24 hr tablet   Oral   Take 7.5 mg by mouth daily.         . fenofibrate (TRICOR) 48 MG tablet   Oral   Take 48 mg by mouth daily.         Marland Kitchen FLUoxetine  (PROZAC) 20 MG tablet   Oral   Take 20 mg by mouth daily.         . fluticasone (FLONASE) 50 MCG/ACT nasal spray   Each Nare   Place 1 spray into both nostrils daily as needed for allergies or rhinitis.         . furosemide (LASIX) 40 MG tablet   Oral   Take 40 mg by mouth 2 (two) times daily.          . Glucosamine HCl-MSM 750-750 MG TABS   Oral   Take 1,500 tablets by mouth daily.          . hyoscyamine (ANASPAZ) 0.125 MG TBDP   Sublingual   Place 0.125 mg under the tongue every 6 (six) hours as needed for bladder spasms. Patient uses this medication prn.         Marland Kitchen L-Lysine 500 MG TABS   Oral   Take 500 mg by mouth daily.         Marland Kitchen levothyroxine (SYNTHROID, LEVOTHROID) 75 MCG tablet   Oral   Take 75 mcg by mouth daily.         . metoprolol succinate (TOPROL-XL) 100 MG 24 hr tablet   Oral   Take 100 mg by mouth daily. Take with or immediately following a meal.         . mirabegron ER (MYRBETRIQ) 50 MG TB24   Oral   Take 25 mg by mouth daily.          . Multiple Vitamin (MULTIVITAMIN) tablet   Oral   Take 1 tablet by mouth daily.         Marland Kitchen omega-3 acid ethyl esters (LOVAZA) 1 G capsule   Oral   Take 1 g by mouth 2 (two) times daily.         Marland Kitchen POTASSIUM CHLORIDE PO   Oral   Take 300 mcg by mouth daily.          . predniSONE (DELTASONE) 5 MG tablet   Oral   Take 5 mg by mouth  daily with breakfast.         . sulfamethoxazole-trimethoprim (BACTRIM,SEPTRA) 400-80 MG per tablet   Oral   Take 1 tablet by mouth 3 (three) times a week. Patient uses this medication on Mon, Wed., and Friday.         . ondansetron (ZOFRAN ODT) 4 MG disintegrating tablet   Oral   Take 1 tablet (4 mg total) by mouth every 8 (eight) hours as needed for nausea or vomiting.   30 tablet   0    BP 169/92  Pulse 78  Temp(Src) 98.2 F (36.8 C) (Oral)  Resp 18  Ht 5\' 3"  (1.6 m)  Wt 172 lb 12.8 oz (78.382 kg)  BMI 30.62 kg/m2  SpO2 97% Physical Exam   Nursing note and vitals reviewed. Constitutional: She is oriented to person, place, and time. She appears well-developed and well-nourished. No distress.  HENT:  Head: Normocephalic and atraumatic.  Eyes: EOM are normal. Pupils are equal, round, and reactive to light.  Neck: Neck supple.  Cardiovascular: Normal rate, regular rhythm and normal heart sounds.   No murmur heard. Pulmonary/Chest: Effort normal and breath sounds normal. She has no wheezes.  Abdominal: Soft. Bowel sounds are normal. There is no tenderness. There is no guarding.  Musculoskeletal: She exhibits no edema.  Neurological: She is alert and oriented to person, place, and time. She has normal strength. She exhibits normal muscle tone.  Skin: Skin is warm and dry. She is not diaphoretic.  Psychiatric: She has a normal mood and affect.    ED Course  Procedures (including critical care time) Labs Review Labs Reviewed  CBC WITH DIFFERENTIAL - Abnormal; Notable for the following:    RBC 5.13 (*)    Hemoglobin 16.6 (*)    HCT 47.4 (*)    All other components within normal limits  COMPREHENSIVE METABOLIC PANEL - Abnormal; Notable for the following:    Potassium 3.3 (*)    Glucose, Bld 162 (*)    Calcium 10.7 (*)    GFR calc non Af Amer 52 (*)    GFR calc Af Amer 60 (*)    All other components within normal limits  URINALYSIS, ROUTINE W REFLEX MICROSCOPIC - Abnormal; Notable for the following:    APPearance CLOUDY (*)    Glucose, UA 100 (*)    Bilirubin Urine SMALL (*)    Ketones, ur 15 (*)    Protein, ur 30 (*)    Leukocytes, UA SMALL (*)    All other components within normal limits  URINE MICROSCOPIC-ADD ON - Abnormal; Notable for the following:    Squamous Epithelial / LPF MANY (*)    Bacteria, UA FEW (*)    Casts HYALINE CASTS (*)    All other components within normal limits  URINE CULTURE  LIPASE, BLOOD  PARATHYROID HORMONE, INTACT (NO CA)   Imaging Review Dg Abd 1 View  09/04/2013   CLINICAL DATA:   Nausea vomiting, history of kidney transplant  EXAM: ABDOMEN - 1 VIEW  COMPARISON:  Correlated with CT dated 04/23/2012  FINDINGS: The bowel gas pattern is normal. Small rounded calcified densities project in the pelvis left mid and lower regions likely representing phleboliths. Calcified densities also project within the right upper quadrant consistent with splenic calcifications likely secondary to prior granulomatous disease. The osseous structures are unremarkable.  IMPRESSION: Nonobstructive bowel gas pattern   Electronically Signed   By: Margaree Mackintosh M.D.   On: 09/04/2013 12:59    EKG Interpretation  None       MDM   1. Dehydration   2. Emesis     79 year old female here with emesis x3 days with history of renal failure status post transplant. Will check basic labs, and rule out obstruction with KUB although BM and flatus since onset this is unlikely. We'll replace fluids bolus x1 give IV Zofran.   Labs remarkable for mild hypokalemia, hyperglycemia, elevated calcium, hemoconcentration Cre improved to 1.0 UA with small Leuk but many Squams and only few bacteria- likely not a clean catch as this was collected from bed-pan  Elevated calcium so held citracal and checked PTH, advised PCP follow-up   Symptoms improved with IV and PO zofran, Patient began tolerating fluids and could tolerate her anti-rejection meds prior to dc. Also given anti-hypertensive Advised holding furosemide for 3 days.  Discussed case with Dr. Jimmy Footman and he is ok with dc if she is tolerating fluids and able to keep down meds.   Red flags reviewed, discussed low threshold for return.   Laroy Apple, MD [facility] Resident, PGY-2 09/04/2013, 6:21 PM          Timmothy Euler, MD 09/04/13 [phone]

## 2013-09-04 NOTE — ED Notes (Signed)
Pt able to swallow pills with no problems

## 2013-09-04 NOTE — ED Notes (Signed)
Returned from x-ray, placed back on cardiac monitor. No complaints at this time.

## 2013-09-04 NOTE — ED Notes (Signed)
Placed pt on the bedpan for urine sample

## 2013-09-04 NOTE — ED Notes (Signed)
Pt with hx of kidney transplant in 2010 to ED c/o emesis x 3 days, no pain, no bowel movements and urination x 1 in those 3 days.

## 2013-09-04 NOTE — ED Notes (Signed)
Patient states she is still unable to give a urine sample.

## 2013-09-04 NOTE — ED Notes (Signed)
Pt transported to xray 

## 2013-09-05 LAB — URINE CULTURE: Colony Count: 45000

## 2013-09-07 ENCOUNTER — Other Ambulatory Visit (HOSPITAL_COMMUNITY): Payer: Self-pay | Admitting: Geriatric Medicine

## 2013-09-07 DIAGNOSIS — M7989 Other specified soft tissue disorders: Secondary | ICD-10-CM

## 2013-09-07 LAB — PARATHYROID HORMONE, INTACT (NO CA): PTH: 99.5 pg/mL — ABNORMAL HIGH (ref 14.0–72.0)

## 2013-09-12 NOTE — ED Provider Notes (Signed)
I saw and evaluated the patient, reviewed the resident's note and I agree with the findings and plan.   .Face to face Exam:  General:  Awake HEENT:  Atraumatic Resp:  Normal effort Abd:  Nondistended Neuro:No focal weakness  Dot Lanes, MD 09/12/13 [phone]

## 2013-09-14 ENCOUNTER — Ambulatory Visit (HOSPITAL_COMMUNITY)
Admission: RE | Admit: 2013-09-14 | Discharge: 2013-09-14 | Disposition: A | Discharge status: Home / Self Care | Payer: Medicare Other | Source: Ambulatory Visit | Attending: Geriatric Medicine | Admitting: Geriatric Medicine

## 2013-09-14 DIAGNOSIS — M7989 Other specified soft tissue disorders: Secondary | ICD-10-CM | POA: Insufficient documentation

## 2013-09-14 DIAGNOSIS — Z86718 Personal history of other venous thrombosis and embolism: Secondary | ICD-10-CM | POA: Insufficient documentation

## 2013-09-14 NOTE — Progress Notes (Signed)
*  PRELIMINARY RESULTS* Vascular Ultrasound Right lower extremity venous duplex has been completed.  Preliminary findings: No evidence of Acute DVT. Chronic DVT noted in right popliteal vein. Right Baker's cyst noted.   Called report to Lelan Pons at Dr. Carlyle Lipa office.    Landry Mellow, RDMS, RVT  09/14/2013, 10:10 AM

## 2013-09-23 ENCOUNTER — Ambulatory Visit: Payer: Medicare Other | Admitting: Neurology

## 2013-10-05 ENCOUNTER — Emergency Department (HOSPITAL_COMMUNITY): Payer: Medicare Other

## 2013-10-05 ENCOUNTER — Encounter (HOSPITAL_COMMUNITY): Payer: Self-pay | Admitting: Emergency Medicine

## 2013-10-05 ENCOUNTER — Inpatient Hospital Stay (HOSPITAL_COMMUNITY)
Admission: EM | Admit: 2013-10-05 | Discharge: 2013-10-09 | DRG: 871 | Disposition: A | Discharge status: Skilled Nursing Facility | Payer: Medicare Other | Attending: Internal Medicine | Admitting: Internal Medicine

## 2013-10-05 DIAGNOSIS — E079 Disorder of thyroid, unspecified: Secondary | ICD-10-CM | POA: Diagnosis present

## 2013-10-05 DIAGNOSIS — Z79899 Other long term (current) drug therapy: Secondary | ICD-10-CM

## 2013-10-05 DIAGNOSIS — J449 Chronic obstructive pulmonary disease, unspecified: Secondary | ICD-10-CM

## 2013-10-05 DIAGNOSIS — D649 Anemia, unspecified: Secondary | ICD-10-CM | POA: Diagnosis present

## 2013-10-05 DIAGNOSIS — E119 Type 2 diabetes mellitus without complications: Secondary | ICD-10-CM | POA: Diagnosis present

## 2013-10-05 DIAGNOSIS — F329 Major depressive disorder, single episode, unspecified: Secondary | ICD-10-CM

## 2013-10-05 DIAGNOSIS — D899 Disorder involving the immune mechanism, unspecified: Secondary | ICD-10-CM | POA: Diagnosis present

## 2013-10-05 DIAGNOSIS — A498 Other bacterial infections of unspecified site: Secondary | ICD-10-CM | POA: Diagnosis present

## 2013-10-05 DIAGNOSIS — R112 Nausea with vomiting, unspecified: Secondary | ICD-10-CM

## 2013-10-05 DIAGNOSIS — Z940 Kidney transplant status: Secondary | ICD-10-CM

## 2013-10-05 DIAGNOSIS — E039 Hypothyroidism, unspecified: Secondary | ICD-10-CM

## 2013-10-05 DIAGNOSIS — F411 Generalized anxiety disorder: Secondary | ICD-10-CM

## 2013-10-05 DIAGNOSIS — F419 Anxiety disorder, unspecified: Secondary | ICD-10-CM | POA: Diagnosis present

## 2013-10-05 DIAGNOSIS — J4489 Other specified chronic obstructive pulmonary disease: Secondary | ICD-10-CM | POA: Diagnosis present

## 2013-10-05 DIAGNOSIS — R42 Dizziness and giddiness: Secondary | ICD-10-CM

## 2013-10-05 DIAGNOSIS — E785 Hyperlipidemia, unspecified: Secondary | ICD-10-CM | POA: Diagnosis present

## 2013-10-05 DIAGNOSIS — R269 Unspecified abnormalities of gait and mobility: Secondary | ICD-10-CM

## 2013-10-05 DIAGNOSIS — N318 Other neuromuscular dysfunction of bladder: Secondary | ICD-10-CM | POA: Diagnosis present

## 2013-10-05 DIAGNOSIS — K5792 Diverticulitis of intestine, part unspecified, without perforation or abscess without bleeding: Secondary | ICD-10-CM

## 2013-10-05 DIAGNOSIS — N186 End stage renal disease: Secondary | ICD-10-CM | POA: Diagnosis present

## 2013-10-05 DIAGNOSIS — I10 Essential (primary) hypertension: Secondary | ICD-10-CM | POA: Diagnosis present

## 2013-10-05 DIAGNOSIS — A4151 Sepsis due to Escherichia coli [E. coli]: Principal | ICD-10-CM | POA: Diagnosis present

## 2013-10-05 DIAGNOSIS — R509 Fever, unspecified: Secondary | ICD-10-CM

## 2013-10-05 DIAGNOSIS — I120 Hypertensive chronic kidney disease with stage 5 chronic kidney disease or end stage renal disease: Secondary | ICD-10-CM | POA: Diagnosis present

## 2013-10-05 DIAGNOSIS — N390 Urinary tract infection, site not specified: Secondary | ICD-10-CM | POA: Diagnosis present

## 2013-10-05 DIAGNOSIS — N184 Chronic kidney disease, stage 4 (severe): Secondary | ICD-10-CM | POA: Diagnosis present

## 2013-10-05 DIAGNOSIS — Z889 Allergy status to unspecified drugs, medicaments and biological substances status: Secondary | ICD-10-CM

## 2013-10-05 DIAGNOSIS — K219 Gastro-esophageal reflux disease without esophagitis: Secondary | ICD-10-CM | POA: Diagnosis present

## 2013-10-05 DIAGNOSIS — F3289 Other specified depressive episodes: Secondary | ICD-10-CM | POA: Diagnosis present

## 2013-10-05 DIAGNOSIS — A419 Sepsis, unspecified organism: Secondary | ICD-10-CM | POA: Diagnosis present

## 2013-10-05 DIAGNOSIS — I2510 Atherosclerotic heart disease of native coronary artery without angina pectoris: Secondary | ICD-10-CM | POA: Diagnosis present

## 2013-10-05 LAB — CBC
HCT: 37.3 % (ref 36.0–46.0)
Hemoglobin: 12.4 g/dL (ref 12.0–15.0)
Hemoglobin: 12.9 g/dL (ref 12.0–15.0)
MCH: 31.3 pg (ref 26.0–34.0)
MCHC: 33.2 g/dL (ref 30.0–36.0)
MCV: 94.2 fL (ref 78.0–100.0)
RBC: 3.96 MIL/uL (ref 3.87–5.11)
RBC: 4.07 MIL/uL (ref 3.87–5.11)
RDW: 14.4 % (ref 11.5–15.5)
RDW: 14.7 % (ref 11.5–15.5)

## 2013-10-05 LAB — URINE MICROSCOPIC-ADD ON

## 2013-10-05 LAB — URINALYSIS, ROUTINE W REFLEX MICROSCOPIC
Bilirubin Urine: NEGATIVE
Ketones, ur: NEGATIVE mg/dL
Nitrite: POSITIVE — AB
Urobilinogen, UA: 0.2 mg/dL (ref 0.0–1.0)
pH: 5.5 (ref 5.0–8.0)

## 2013-10-05 LAB — BASIC METABOLIC PANEL
BUN: 20 mg/dL (ref 6–23)
Chloride: 106 mEq/L (ref 96–112)
Creatinine, Ser: 1.22 mg/dL — ABNORMAL HIGH (ref 0.50–1.10)
GFR calc Af Amer: 48 mL/min — ABNORMAL LOW (ref >90)
GFR calc non Af Amer: 41 mL/min — ABNORMAL LOW (ref >90)
Glucose, Bld: 150 mg/dL — ABNORMAL HIGH (ref 70–99)

## 2013-10-05 LAB — CREATININE, SERUM
Creatinine, Ser: 1.14 mg/dL — ABNORMAL HIGH (ref 0.50–1.10)
GFR calc non Af Amer: 45 mL/min — ABNORMAL LOW (ref >90)

## 2013-10-05 LAB — CG4 I-STAT (LACTIC ACID): Lactic Acid, Venous: 2.28 mmol/L — ABNORMAL HIGH (ref 0.5–2.2)

## 2013-10-05 MED ORDER — FLUTICASONE PROPIONATE 50 MCG/ACT NA SUSP
1.0000 | Freq: Every day | NASAL | Status: DC | PRN
  Filled 2013-10-05: qty 16

## 2013-10-05 MED ORDER — ACETAMINOPHEN 325 MG PO TABS
650.0000 mg | ORAL_TABLET | Freq: Four times a day (QID) | ORAL | Status: DC | PRN
  Administered 2013-10-05 – 2013-10-06 (×2): 650 mg via ORAL
  Filled 2013-10-05 (×2): qty 2

## 2013-10-05 MED ORDER — FLUOXETINE HCL 20 MG PO CAPS
20.0000 mg | ORAL_CAPSULE | Freq: Every day | ORAL | Status: DC
  Administered 2013-10-05 – 2013-10-09 (×5): 20 mg via ORAL
  Filled 2013-10-05 (×5): qty 1

## 2013-10-05 MED ORDER — PREDNISONE 5 MG PO TABS
5.0000 mg | ORAL_TABLET | Freq: Every day | ORAL | Status: DC
  Administered 2013-10-06 – 2013-10-09 (×4): 5 mg via ORAL
  Filled 2013-10-05 (×5): qty 1

## 2013-10-05 MED ORDER — LORATADINE 10 MG PO TABS
10.0000 mg | ORAL_TABLET | Freq: Every day | ORAL | Status: DC
  Administered 2013-10-05 – 2013-10-09 (×5): 10 mg via ORAL
  Filled 2013-10-05 (×5): qty 1

## 2013-10-05 MED ORDER — DEXTROSE 5 % IV SOLN
1.0000 g | INTRAVENOUS | Status: DC
  Administered 2013-10-06: 1 g via INTRAVENOUS
  Filled 2013-10-05 (×2): qty 10

## 2013-10-05 MED ORDER — ALBUTEROL SULFATE HFA 108 (90 BASE) MCG/ACT IN AERS
2.0000 | INHALATION_SPRAY | Freq: Four times a day (QID) | RESPIRATORY_TRACT | Status: DC | PRN
  Filled 2013-10-05: qty 6.7

## 2013-10-05 MED ORDER — ACETAMINOPHEN 325 MG PO TABS
650.0000 mg | ORAL_TABLET | Freq: Once | ORAL | Status: AC
  Administered 2013-10-05: 650 mg via ORAL
  Filled 2013-10-05: qty 2

## 2013-10-05 MED ORDER — MIRABEGRON ER 25 MG PO TB24
25.0000 mg | ORAL_TABLET | Freq: Every day | ORAL | Status: DC
  Administered 2013-10-05 – 2013-10-09 (×5): 25 mg via ORAL
  Filled 2013-10-05 (×5): qty 1

## 2013-10-05 MED ORDER — ACETAMINOPHEN 650 MG RE SUPP: 650.0000 mg | Freq: Four times a day (QID) | RECTAL | Status: DC | PRN

## 2013-10-05 MED ORDER — MORPHINE SULFATE 2 MG/ML IJ SOLN: 2.0000 mg | INTRAMUSCULAR | Status: DC | PRN

## 2013-10-05 MED ORDER — SODIUM CHLORIDE 0.9 % IJ SOLN
3.0000 mL | Freq: Two times a day (BID) | INTRAMUSCULAR | Status: DC
  Administered 2013-10-06 – 2013-10-08 (×4): 3 mL via INTRAVENOUS

## 2013-10-05 MED ORDER — POTASSIUM CHLORIDE 20 MEQ PO PACK
20.0000 meq | PACK | Freq: Every day | ORAL | Status: DC
  Administered 2013-10-05: 10:00:00 20 meq via ORAL
  Filled 2013-10-05 (×2): qty 1

## 2013-10-05 MED ORDER — FENOFIBRATE 54 MG PO TABS
54.0000 mg | ORAL_TABLET | Freq: Every day | ORAL | Status: DC
  Administered 2013-10-05 – 2013-10-09 (×5): 54 mg via ORAL
  Filled 2013-10-05 (×5): qty 1

## 2013-10-05 MED ORDER — SULFAMETHOXAZOLE-TRIMETHOPRIM 400-80 MG PO TABS
1.0000 | ORAL_TABLET | ORAL | Status: DC
  Administered 2013-10-05 – 2013-10-07 (×2): 1 via ORAL
  Filled 2013-10-05 (×3): qty 1

## 2013-10-05 MED ORDER — B COMPLEX-C PO TABS
1.0000 | ORAL_TABLET | Freq: Every day | ORAL | Status: DC
  Administered 2013-10-05 – 2013-10-09 (×5): 1 via ORAL
  Filled 2013-10-05 (×5): qty 1

## 2013-10-05 MED ORDER — METOPROLOL SUCCINATE ER 100 MG PO TB24
100.0000 mg | ORAL_TABLET | Freq: Every day | ORAL | Status: DC
  Administered 2013-10-05 – 2013-10-09 (×5): 100 mg via ORAL
  Filled 2013-10-05 (×5): qty 1

## 2013-10-05 MED ORDER — DARIFENACIN HYDROBROMIDE ER 7.5 MG PO TB24
7.5000 mg | ORAL_TABLET | Freq: Every day | ORAL | Status: DC
  Administered 2013-10-05 – 2013-10-09 (×5): 7.5 mg via ORAL
  Filled 2013-10-05 (×5): qty 1

## 2013-10-05 MED ORDER — HYOSCYAMINE SULFATE 0.125 MG PO TBDP
0.1250 mg | ORAL_TABLET | Freq: Four times a day (QID) | ORAL | Status: DC | PRN
  Filled 2013-10-05: qty 1

## 2013-10-05 MED ORDER — ONDANSETRON 8 MG PO TBDP
8.0000 mg | ORAL_TABLET | Freq: Once | ORAL | Status: DC
  Filled 2013-10-05: qty 2

## 2013-10-05 MED ORDER — LEVOTHYROXINE SODIUM 75 MCG PO TABS
75.0000 ug | ORAL_TABLET | Freq: Every day | ORAL | Status: DC
  Administered 2013-10-05 – 2013-10-09 (×5): 75 ug via ORAL
  Filled 2013-10-05 (×5): qty 1

## 2013-10-05 MED ORDER — AMLODIPINE BESYLATE 5 MG PO TABS
5.0000 mg | ORAL_TABLET | Freq: Every day | ORAL | Status: DC
  Administered 2013-10-05 – 2013-10-09 (×5): 5 mg via ORAL
  Filled 2013-10-05 (×5): qty 1

## 2013-10-05 MED ORDER — SODIUM CHLORIDE 0.9 % IV SOLN
INTRAVENOUS | Status: AC
  Administered 2013-10-05: 12:00:00 via INTRAVENOUS

## 2013-10-05 MED ORDER — OMEGA-3-ACID ETHYL ESTERS 1 G PO CAPS
1.0000 g | ORAL_CAPSULE | Freq: Two times a day (BID) | ORAL | Status: DC
  Administered 2013-10-05 – 2013-10-09 (×9): 1 g via ORAL
  Filled 2013-10-05 (×11): qty 1

## 2013-10-05 MED ORDER — BUDESONIDE-FORMOTEROL FUMARATE 160-4.5 MCG/ACT IN AERO
2.0000 | INHALATION_SPRAY | Freq: Two times a day (BID) | RESPIRATORY_TRACT | Status: DC
  Administered 2013-10-05 – 2013-10-09 (×7): 2 via RESPIRATORY_TRACT
  Filled 2013-10-05 (×2): qty 6

## 2013-10-05 MED ORDER — AZATHIOPRINE 50 MG PO TABS
50.0000 mg | ORAL_TABLET | Freq: Two times a day (BID) | ORAL | Status: DC
  Administered 2013-10-05 – 2013-10-09 (×9): 50 mg via ORAL
  Filled 2013-10-05 (×10): qty 1

## 2013-10-05 MED ORDER — ONE-DAILY MULTI VITAMINS PO TABS
1.0000 | ORAL_TABLET | Freq: Every day | ORAL | Status: DC
  Administered 2013-10-05 – 2013-10-09 (×5): 1 via ORAL
  Filled 2013-10-05 (×5): qty 1

## 2013-10-05 MED ORDER — CLONIDINE HCL 0.1 MG PO TABS
0.1000 mg | ORAL_TABLET | Freq: Two times a day (BID) | ORAL | Status: DC
  Administered 2013-10-05 – 2013-10-09 (×9): 0.1 mg via ORAL
  Filled 2013-10-05 (×10): qty 1

## 2013-10-05 MED ORDER — ONDANSETRON 4 MG PO TBDP
4.0000 mg | ORAL_TABLET | Freq: Three times a day (TID) | ORAL | Status: DC | PRN
  Administered 2013-10-05 – 2013-10-08 (×3): 4 mg via ORAL
  Filled 2013-10-05 (×4): qty 1

## 2013-10-05 MED ORDER — HEPARIN SODIUM (PORCINE) 5000 UNIT/ML IJ SOLN
5000.0000 [IU] | Freq: Three times a day (TID) | INTRAMUSCULAR | Status: DC
  Administered 2013-10-05 – 2013-10-09 (×12): 5000 [IU] via SUBCUTANEOUS
  Filled 2013-10-05 (×15): qty 1

## 2013-10-05 MED ORDER — CYCLOSPORINE 100 MG PO CAPS
100.0000 mg | ORAL_CAPSULE | Freq: Two times a day (BID) | ORAL | Status: DC
  Administered 2013-10-05 – 2013-10-09 (×9): 100 mg via ORAL
  Filled 2013-10-05 (×10): qty 1

## 2013-10-05 MED ORDER — COLESEVELAM HCL 625 MG PO TABS
1875.0000 mg | ORAL_TABLET | Freq: Two times a day (BID) | ORAL | Status: DC
  Administered 2013-10-05 – 2013-10-09 (×8): 1875 mg via ORAL
  Filled 2013-10-05 (×10): qty 3

## 2013-10-05 MED ORDER — FUROSEMIDE 40 MG PO TABS
40.0000 mg | ORAL_TABLET | Freq: Two times a day (BID) | ORAL | Status: DC
  Administered 2013-10-05: 11:00:00 40 mg via ORAL
  Filled 2013-10-05 (×3): qty 1

## 2013-10-05 MED ORDER — DEXTROSE 5 % IV SOLN
1.0000 g | Freq: Once | INTRAVENOUS | Status: AC
  Administered 2013-10-05: 1 g via INTRAVENOUS
  Filled 2013-10-05: qty 10

## 2013-10-05 NOTE — ED Notes (Signed)
Dr Sharol Given given a copy of the lactic acid results 2.28

## 2013-10-05 NOTE — Progress Notes (Signed)
UR completed. Toriana Sponsel RN CCM Case Mgmt phone [phone] 

## 2013-10-05 NOTE — Progress Notes (Signed)
[facility]AY[facility] VACCARELLO [id number] Code Status: full   Admission Data: 10/05/2013 11:21 AM Attending Provider:  Ree Kida [id number] Marcello Moores, MD Consults/ Treatment Team:    Patricia [name] is a 79 y.o. female patient admitted from ED awake, alert - oriented  X 3 - no acute distress noted.  VSS - Blood pressure 143/60, pulse 71, temperature 98.4 F (36.9 C), temperature source Oral, resp. rate 18, height 5\' 4"  (1.626 m), weight 82.101 kg (181 lb), SpO2 94.00%.  no c/o shortness of breath, no c/o chest pain. Cardiac tele # 18, in place, cardiac monitor yields:normal sinus rhythm. O2:   @ 2 l/min per minute. IV Fluids:  IV in place, occlusive dsg intact without redness, IV cath forearm right, condition patent and no redness normal saline.  Allergies:   Allergies  Allergen Reactions  . Aricept [Donepezil Hcl] Nausea And Vomiting  . Codeine Nausea And Vomiting  . Penicillins Other (See Comments)    Doesn't remember  . Statins Other (See Comments)    Doesn't remember  . Tetracyclines & Related Rash     Past Medical History  Diagnosis Date  . Renal disorder   . Hx of kidney transplant     right  . Coronary artery disease   . Hypertension   . Thyroid disease   . Multiple allergies   . Abnormal gait   . Reflux   . Diverticulitis   . Hypothyroidism   . Depression   . Anxiety   . COPD, mild   . Dizziness   . Diabetes    Medications Prior to Admission  Medication Sig Dispense Refill  . amLODipine (NORVASC) 5 MG tablet Take 5 mg by mouth daily.      Marland Kitchen azaTHIOprine (IMURAN) 50 MG tablet Take 50 mg by mouth 2 (two) times daily.       . Calcium-Magnesium-Vitamin D (CITRACAL CALCIUM+D PO) Take 1 tablet by mouth 2 (two) times daily.       . cloNIDine (CATAPRES) 0.1 MG tablet Take 0.1 mg by mouth 2 (two) times daily.      . Coenzyme Q10 (CO Q 10) 100 MG CAPS Take 1 tablet by mouth daily.       . colesevelam (WELCHOL) 625 MG tablet Take 1,875 mg by mouth 2 (two) times daily with a meal.       . cycloSPORINE (SANDIMMUNE) 100 MG capsule Take 100 mg by mouth 2 (two) times daily.      . ENABLEX 7.5 MG 24 hr tablet Take 7.5 mg by mouth daily.      . fenofibrate (TRICOR) 48 MG tablet Take 48 mg by mouth daily.      Marland Kitchen FLUoxetine (PROZAC) 20 MG tablet Take 20 mg by mouth daily.      . fluticasone (FLONASE) 50 MCG/ACT nasal spray Place 1 spray into both nostrils daily as needed for allergies or rhinitis.      . furosemide (LASIX) 40 MG tablet Take 40 mg by mouth 2 (two) times daily.       . Glucosamine HCl-MSM 750-750 MG TABS Take 1,500 tablets by mouth daily.       . hyoscyamine (ANASPAZ) 0.125 MG TBDP Place 0.125 mg under the tongue every 6 (six) hours as needed for bladder spasms. Patient uses this medication prn.      Marland Kitchen L-Lysine 500 MG TABS Take 500 mg by mouth daily.      Marland Kitchen levothyroxine (SYNTHROID, LEVOTHROID) 75 MCG tablet Take 75 mcg by mouth daily.      Marland Kitchen  metoprolol succinate (TOPROL-XL) 100 MG 24 hr tablet Take 100 mg by mouth daily. Take with or immediately following a meal.      . mirabegron ER (MYRBETRIQ) 50 MG TB24 Take 25 mg by mouth daily.       . Multiple Vitamin (MULTIVITAMIN) tablet Take 1 tablet by mouth daily.      Marland Kitchen omega-3 acid ethyl esters (LOVAZA) 1 G capsule Take 1 g by mouth 2 (two) times daily.      . ondansetron (ZOFRAN ODT) 4 MG disintegrating tablet Take 1 tablet (4 mg total) by mouth every 8 (eight) hours as needed for nausea or vomiting.  30 tablet  0  . POTASSIUM CHLORIDE PO Take 300 mcg by mouth daily.       . predniSONE (DELTASONE) 5 MG tablet Take 5 mg by mouth daily with breakfast.      . sulfamethoxazole-trimethoprim (BACTRIM,SEPTRA) 400-80 MG per tablet Take 1 tablet by mouth 3 (three) times a week. Patient uses this medication on Mon, Wed., and Friday.      Marland Kitchen albuterol (PROVENTIL HFA;VENTOLIN HFA) 108 (90 BASE) MCG/ACT inhaler Inhale 2 puffs into the lungs every 6 (six) hours as needed for wheezing or shortness of breath. Patient uses this medication  prn.      . B Complex-C (B-COMPLEX WITH VITAMIN C) tablet Take 1 tablet by mouth daily.      . budesonide-formoterol (SYMBICORT) 160-4.5 MCG/ACT inhaler Inhale 2 puffs into the lungs 2 (two) times daily.      . cetirizine (ZYRTEC) 10 MG tablet Take 10 mg by mouth daily.       History:  obtained from the patient. Tobacco/alcohol: denied none  Orientation to room, and floor completed with information packet given to patient/family.  Patient declined safety video at this time.  Admission INP armband ID verified with patient/family, and in place.   SR up x 2, fall assessment complete, with patient and family able to verbalize understanding of risk associated with falls, and verbalized understanding to call nsg before up out of bed.  Call light within reach, patient able to voice, and demonstrate understanding.  Skin, clean-dry- intact without evidence of bruising, or skin tears.   No evidence of skin break down noted on exam.     Will cont to eval and treat per MD orders.  Gracy Bruins, RN 10/05/2013 11:21 AM

## 2013-10-05 NOTE — ED Provider Notes (Signed)
CSN: [id number]     Arrival date & time 10/05/13  0404 History   First MD Initiated Contact with Patient 10/05/13 [phone]     Chief Complaint  Patient presents with  . Emesis  . Fever   (Consider location/radiation/quality/duration/timing/severity/associated sxs/prior Treatment) HPI 79 year old female presents to emergency room from home with complaint of nausea, vomiting, and fever starting last night.  Symptoms started around 11 PM.  She complains of diffuse abdominal pain.  She does not have any medications for nausea at home.  Patient has past history of end-stage renal disease, status post kidney transplant 4 years ago.  She is on rejection medications at this time.  She and husband feel she probably has a urinary tract infection.  She is also complaining of chronic right knee pain.  No sick contacts, no unusual foods.  She denies any urinary symptoms. Past Medical History  Diagnosis Date  . Renal disorder   . Hx of kidney transplant     right  . Coronary artery disease   . Hypertension   . Thyroid disease   . Multiple allergies   . Abnormal gait   . Reflux   . Diverticulitis   . Hypothyroidism   . Depression   . Anxiety   . COPD, mild   . Dizziness   . Diabetes    Past Surgical History  Procedure Laterality Date  . Nephrectomy transplanted organ    . Tumor removal      Brain    Family History  Problem Relation Age of Onset  . Alzheimer's disease Mother   . Alcoholism Father   . High Cholesterol Mother    History  Substance Use Topics  . Smoking status: Never Smoker   . Smokeless tobacco: Never Used  . Alcohol Use: No   OB History   Grav Para Term Preterm Abortions TAB SAB Ect Mult Living                 Review of Systems  See History of Present Illness; otherwise all other systems are reviewed and negative  Allergies  Aricept; Codeine; Penicillins; Statins; and Tetracyclines & related  Home Medications   Current Outpatient Rx  Name  Route  Sig   Dispense  Refill  . amLODipine (NORVASC) 5 MG tablet   Oral   Take 5 mg by mouth daily.         Marland Kitchen azaTHIOprine (IMURAN) 50 MG tablet   Oral   Take 50 mg by mouth 2 (two) times daily.          . Calcium-Magnesium-Vitamin D (CITRACAL CALCIUM+D PO)   Oral   Take 1 tablet by mouth 2 (two) times daily.          . cloNIDine (CATAPRES) 0.1 MG tablet   Oral   Take 0.1 mg by mouth 2 (two) times daily.         . Coenzyme Q10 (CO Q 10) 100 MG CAPS   Oral   Take 1 tablet by mouth daily.          . colesevelam (WELCHOL) 625 MG tablet   Oral   Take 1,875 mg by mouth 2 (two) times daily with a meal.         . cycloSPORINE (SANDIMMUNE) 100 MG capsule   Oral   Take 100 mg by mouth 2 (two) times daily.         . ENABLEX 7.5 MG 24 hr tablet   Oral   Take  7.5 mg by mouth daily.         . fenofibrate (TRICOR) 48 MG tablet   Oral   Take 48 mg by mouth daily.         Marland Kitchen FLUoxetine (PROZAC) 20 MG tablet   Oral   Take 20 mg by mouth daily.         . fluticasone (FLONASE) 50 MCG/ACT nasal spray   Each Nare   Place 1 spray into both nostrils daily as needed for allergies or rhinitis.         . furosemide (LASIX) 40 MG tablet   Oral   Take 40 mg by mouth 2 (two) times daily.          . Glucosamine HCl-MSM 750-750 MG TABS   Oral   Take 1,500 tablets by mouth daily.          . hyoscyamine (ANASPAZ) 0.125 MG TBDP   Sublingual   Place 0.125 mg under the tongue every 6 (six) hours as needed for bladder spasms. Patient uses this medication prn.         Marland Kitchen L-Lysine 500 MG TABS   Oral   Take 500 mg by mouth daily.         Marland Kitchen levothyroxine (SYNTHROID, LEVOTHROID) 75 MCG tablet   Oral   Take 75 mcg by mouth daily.         . metoprolol succinate (TOPROL-XL) 100 MG 24 hr tablet   Oral   Take 100 mg by mouth daily. Take with or immediately following a meal.         . mirabegron ER (MYRBETRIQ) 50 MG TB24   Oral   Take 25 mg by mouth daily.          .  Multiple Vitamin (MULTIVITAMIN) tablet   Oral   Take 1 tablet by mouth daily.         Marland Kitchen omega-3 acid ethyl esters (LOVAZA) 1 G capsule   Oral   Take 1 g by mouth 2 (two) times daily.         . ondansetron (ZOFRAN ODT) 4 MG disintegrating tablet   Oral   Take 1 tablet (4 mg total) by mouth every 8 (eight) hours as needed for nausea or vomiting.   30 tablet   0   . POTASSIUM CHLORIDE PO   Oral   Take 300 mcg by mouth daily.          . predniSONE (DELTASONE) 5 MG tablet   Oral   Take 5 mg by mouth daily with breakfast.         . sulfamethoxazole-trimethoprim (BACTRIM,SEPTRA) 400-80 MG per tablet   Oral   Take 1 tablet by mouth 3 (three) times a week. Patient uses this medication on Mon, Wed., and Friday.         Marland Kitchen albuterol (PROVENTIL HFA;VENTOLIN HFA) 108 (90 BASE) MCG/ACT inhaler   Inhalation   Inhale 2 puffs into the lungs every 6 (six) hours as needed for wheezing or shortness of breath. Patient uses this medication prn.         . B Complex-C (B-COMPLEX WITH VITAMIN C) tablet   Oral   Take 1 tablet by mouth daily.         . budesonide-formoterol (SYMBICORT) 160-4.5 MCG/ACT inhaler   Inhalation   Inhale 2 puffs into the lungs 2 (two) times daily.         . cetirizine (ZYRTEC) 10 MG tablet   Oral  Take 10 mg by mouth daily.          BP 170/100  Pulse 92  Temp(Src) 101.1 F (38.4 C) (Oral)  Resp 18  SpO2 92% Physical Exam  Nursing note and vitals reviewed. Constitutional: She is oriented to person, place, and time. She appears well-developed and well-nourished.  Frail elderly, female, uncomfortable appearing  HENT:  Head: Normocephalic and atraumatic.  Right Ear: External ear normal.  Left Ear: External ear normal.  Nose: Nose normal.  Dry mucous membranes  Eyes: Conjunctivae and EOM are normal. Pupils are equal, round, and reactive to light.  Neck: Normal range of motion. Neck supple. No JVD present. No tracheal deviation present. No  thyromegaly present.  Cardiovascular: Normal rate, regular rhythm, normal heart sounds and intact distal pulses.  Exam reveals no gallop and no friction rub.   No murmur heard. Pulmonary/Chest: Effort normal and breath sounds normal. No stridor. No respiratory distress. She has no wheezes. She has no rales. She exhibits no tenderness.  Abdominal: Soft. Bowel sounds are normal. She exhibits no distension and no mass. There is no tenderness. There is no rebound and no guarding.  Patient complains of diffuse abdominal pain, however, no pain on palpation of the abdomen.  Musculoskeletal: Normal range of motion. She exhibits no edema and no tenderness.  Lymphadenopathy:    She has no cervical adenopathy.  Neurological: She is alert and oriented to person, place, and time. She exhibits normal muscle tone. Coordination normal.  Skin: Skin is warm and dry. No rash noted. No erythema. No pallor.  Psychiatric: She has a normal mood and affect. Her behavior is normal. Judgment and thought content normal.    ED Course  Procedures (including critical care time) Labs Review Labs Reviewed  CBC - Abnormal; Notable for the following:    WBC 11.9 (*)    All other components within normal limits  BASIC METABOLIC PANEL - Abnormal; Notable for the following:    Potassium 3.3 (*)    Glucose, Bld 150 (*)    Creatinine, Ser 1.22 (*)    GFR calc non Af Amer 41 (*)    GFR calc Af Amer 48 (*)    All other components within normal limits  URINALYSIS, ROUTINE W REFLEX MICROSCOPIC - Abnormal; Notable for the following:    APPearance CLOUDY (*)    Hgb urine dipstick SMALL (*)    Protein, ur 30 (*)    Nitrite POSITIVE (*)    Leukocytes, UA SMALL (*)    All other components within normal limits  URINE MICROSCOPIC-ADD ON - Abnormal; Notable for the following:    Bacteria, UA MANY (*)    All other components within normal limits  CG4 I-STAT (LACTIC ACID) - Abnormal; Notable for the following:    Lactic Acid,  Venous 2.28 (*)    All other components within normal limits  URINE CULTURE  CULTURE, BLOOD (ROUTINE X 2)  CULTURE, BLOOD (ROUTINE X 2)   Imaging Review Dg Chest 2 View  10/05/2013   CLINICAL DATA:  Cough and chest pain  EXAM: CHEST  2 VIEW  COMPARISON:  Chest radiograph and chest CT April 23, 2012  FINDINGS: There is a calcified granuloma in the left mid lung, stable. Mild elevation of the right hemidiaphragm is present. There is no edema or consolidation. Heart is borderline enlarged with normal pulmonary vascularity. No adenopathy. There is atherosclerotic change in the aorta. There is degenerative change in the thoracic spine.  IMPRESSION: Mild cardiac  enlargement. No edema or consolidation. Calcified left midlung granuloma.   Electronically Signed   By: Lowella Grip M.D.   On: 10/05/2013 07:00    EKG Interpretation   None       MDM   1. Urinary tract infection   2. Fever   3. Nausea and vomiting   4. Hx of kidney transplant    79 year old female with nausea and vomiting with fever.  She is immunocompromised, status post kidney transplant.  She takes several medications for immunosuppression.  We'll check labs to include blood and urine cultures.  Will check chest x-ray.  7:43 AM UTI noted.  Given her weight is present and renal transplant, will discuss with hospitalist for admission    Kalman Drape, MD 10/05/13 [phone]

## 2013-10-05 NOTE — Progress Notes (Signed)
ANTIBIOTIC CONSULT NOTE - INITIAL  Pharmacy Consult for ceftriaxone Indication: UTI  Allergies  Allergen Reactions  . Aricept [Donepezil Hcl] Nausea And Vomiting  . Codeine Nausea And Vomiting  . Penicillins Other (See Comments)    Doesn't remember  . Statins Other (See Comments)    Doesn't remember  . Tetracyclines & Related Rash    Patient Measurements: Height: 5\' 4"  (162.6 cm) Weight: 181 lb (82.101 kg) IBW/kg (Calculated) : 54.7 Adjusted Body Weight:   Vital Signs: Temp: 98.4 F (36.9 C) (12/22 0926) Temp src: Oral (12/22 0926) BP: 143/60 mmHg (12/22 0926) Pulse Rate: 71 (12/22 0926) Intake/Output from previous day:   Intake/Output from this shift:    Labs:  Recent Labs  10/05/13 0540  WBC 11.9*  HGB 12.4  PLT 167  CREATININE 1.22*   Estimated Creatinine Clearance: 38.8 ml/min (by C-G formula based on Cr of 1.22). No results found for this basename: VANCOTROUGH, VANCOPEAK, VANCORANDOM, GENTTROUGH, GENTPEAK, GENTRANDOM, TOBRATROUGH, TOBRAPEAK, TOBRARND, AMIKACINPEAK, AMIKACINTROU, AMIKACIN,  in the last 72 hours   Microbiology: No results found for this or any previous visit (from the past 720 hour(s)).  Medical History: Past Medical History  Diagnosis Date  . Renal disorder   . Hx of kidney transplant     right  . Coronary artery disease   . Hypertension   . Thyroid disease   . Multiple allergies   . Abnormal gait   . Reflux   . Diverticulitis   . Hypothyroidism   . Depression   . Anxiety   . COPD, mild   . Dizziness   . Diabetes     Medications:  Anti-infectives   Start     Dose/Rate Route Frequency Ordered Stop   10/06/13 0800  cefTRIAXone (ROCEPHIN) 1 g in dextrose 5 % 50 mL IVPB     1 g 100 mL/hr over 30 Minutes Intravenous Every 24 hours 10/05/13 0955     10/05/13 1000  sulfamethoxazole-trimethoprim (BACTRIM,SEPTRA) 400-80 MG per tablet 1 tablet     1 tablet Oral Once per day on Mon Wed Fri 10/05/13 0952     10/05/13 0730   cefTRIAXone (ROCEPHIN) 1 g in dextrose 5 % 50 mL IVPB     1 g 100 mL/hr over 30 Minutes Intravenous  Once 10/05/13 [id number] 10/05/13 0831     Assessment: 61 yof presented to the ED with emesis and fever. To start empiric ceftriaxone for UTI. She has already received 1 dose in the ED so far. Noted allergy to penicillin but seemed to tolerate first dose of ceftriaxone. Tmax is 101.1 and WBC is 11.9.   Goal of Therapy:  Eradication of infection  Plan:  1. Ceftriaxone 1gm IV 24H  Will sign off since ceftriaxone requires no further dose adjustment. Please consult pharmacy for any further needs. Thanks for the consult.  Gretel Cantu, Rande Lawman 10/05/2013,9:55 AM

## 2013-10-05 NOTE — ED Notes (Signed)
Pt. reports nausea and vomitting with fever onset last night , denies diarrhea . Respirations unlabored .

## 2013-10-05 NOTE — H&P (Signed)
[facility] History and Physical  LAMA BURCKHARD [id number] DOB: 1932-08-16 DOA: 10/05/2013  Referring physician:  PCP: [name], MD  Specialists:   Chief Complaint: Fever and vomiting  HPI: Patricia [name] is a 79 y.o. female  With a past medical history of renal transplant, coronary disease, hypertension, thyroid disease that presents emergency department for fever as well as nausea and vomiting. This started approximately 11:00 last night when patient had diffuse abdominal pain. Patient currently states she's no longer having abdominal pain upon admission. She denies any further nausea or vomiting at this time. She is accompanied by her husband. Her husband felt that she was probably having urinary symptoms. Patient denies any sick contacts any unusual foods, or any recent travel.  Review of Systems:  Constitutional: Denies  chills, diaphoresis, appetite change and fatigue.  Complains of fever. HEENT: Denies photophobia, eye pain, redness, hearing loss, ear pain, congestion, sore throat, rhinorrhea, sneezing, mouth sores, trouble swallowing, neck pain, neck stiffness and tinnitus.   Respiratory: Denies SOB, DOE, cough, chest tightness,  and wheezing.   Cardiovascular: Denies chest pain, palpitations and leg swelling.  Gastrointestinal: Complains of abdominal pain, nausea and vomiting.  Genitourinary: Denies dysuria, urgency, frequency, hematuria, flank pain and difficulty urinating.  Musculoskeletal: Denies myalgias, back pain, joint swelling, arthralgias and gait problem.  Skin: Denies pallor, rash and wound.  Neurological: Denies dizziness, seizures, syncope, weakness, light-headedness, numbness and headaches.  Hematological: Denies adenopathy. Easy bruising, personal or family bleeding history  Psychiatric/Behavioral: Denies suicidal ideation, mood changes, confusion, nervousness, sleep disturbance and agitation  Past Medical History  Diagnosis Date  . Renal  disorder   . Hx of kidney transplant     right  . Coronary artery disease   . Hypertension   . Thyroid disease   . Multiple allergies   . Abnormal gait   . Reflux   . Diverticulitis   . Hypothyroidism   . Depression   . Anxiety   . COPD, mild   . Dizziness   . Diabetes    Past Surgical History  Procedure Laterality Date  . Nephrectomy transplanted organ    . Tumor removal      Brain    Social History:  reports that she has never smoked. She has never used smokeless tobacco. She reports that she does not drink alcohol or use illicit drugs. Patient currently is married and lives at home with her husband. She does use a cane/walker for ambulation.  Allergies  Allergen Reactions  . Aricept [Donepezil Hcl] Nausea And Vomiting  . Codeine Nausea And Vomiting  . Penicillins Other (See Comments)    Doesn't remember  . Statins Other (See Comments)    Doesn't remember  . Tetracyclines & Related Rash    Family History  Problem Relation Age of Onset  . Alzheimer's disease Mother   . Alcoholism Father   . High Cholesterol Mother     Prior to Admission medications   Medication Sig Start Date End Date Taking? Authorizing Provider  amLODipine (NORVASC) 5 MG tablet Take 5 mg by mouth daily. 06/25/13  Yes Historical Provider, MD  azaTHIOprine (IMURAN) 50 MG tablet Take 50 mg by mouth 2 (two) times daily.    Yes Historical Provider, MD  Calcium-Magnesium-Vitamin D (CITRACAL CALCIUM+D PO) Take 1 tablet by mouth 2 (two) times daily.    Yes Historical Provider, MD  cloNIDine (CATAPRES) 0.1 MG tablet Take 0.1 mg by mouth 2 (two) times daily.   Yes Historical Provider, MD  Coenzyme Q10 (CO Q 10) 100 MG CAPS Take 1 tablet by mouth daily.    Yes Historical Provider, MD  colesevelam (WELCHOL) 625 MG tablet Take 1,875 mg by mouth 2 (two) times daily with a meal.   Yes Historical Provider, MD  cycloSPORINE (SANDIMMUNE) 100 MG capsule Take 100 mg by mouth 2 (two) times daily.   Yes Historical  Provider, MD  ENABLEX 7.5 MG 24 hr tablet Take 7.5 mg by mouth daily. 06/16/13  Yes Historical Provider, MD  fenofibrate (TRICOR) 48 MG tablet Take 48 mg by mouth daily.   Yes Historical Provider, MD  FLUoxetine (PROZAC) 20 MG tablet Take 20 mg by mouth daily.   Yes Historical Provider, MD  fluticasone (FLONASE) 50 MCG/ACT nasal spray Place 1 spray into both nostrils daily as needed for allergies or rhinitis.   Yes Historical Provider, MD  furosemide (LASIX) 40 MG tablet Take 40 mg by mouth 2 (two) times daily.    Yes Historical Provider, MD  Glucosamine HCl-MSM 750-750 MG TABS Take 1,500 tablets by mouth daily.    Yes Historical Provider, MD  hyoscyamine (ANASPAZ) 0.125 MG TBDP Place 0.125 mg under the tongue every 6 (six) hours as needed for bladder spasms. Patient uses this medication prn.   Yes Historical Provider, MD  L-Lysine 500 MG TABS Take 500 mg by mouth daily.   Yes Historical Provider, MD  levothyroxine (SYNTHROID, LEVOTHROID) 75 MCG tablet Take 75 mcg by mouth daily.   Yes Historical Provider, MD  metoprolol succinate (TOPROL-XL) 100 MG 24 hr tablet Take 100 mg by mouth daily. Take with or immediately following a meal.   Yes Historical Provider, MD  mirabegron ER (MYRBETRIQ) 50 MG TB24 Take 25 mg by mouth daily.    Yes Historical Provider, MD  Multiple Vitamin (MULTIVITAMIN) tablet Take 1 tablet by mouth daily.   Yes Historical Provider, MD  omega-3 acid ethyl esters (LOVAZA) 1 G capsule Take 1 g by mouth 2 (two) times daily.   Yes Historical Provider, MD  ondansetron (ZOFRAN ODT) 4 MG disintegrating tablet Take 1 tablet (4 mg total) by mouth every 8 (eight) hours as needed for nausea or vomiting. 09/04/13  Yes Timmothy Euler, MD  POTASSIUM CHLORIDE PO Take 300 mcg by mouth daily.    Yes Historical Provider, MD  predniSONE (DELTASONE) 5 MG tablet Take 5 mg by mouth daily with breakfast.   Yes Historical Provider, MD  sulfamethoxazole-trimethoprim (BACTRIM,SEPTRA) 400-80 MG per tablet  Take 1 tablet by mouth 3 (three) times a week. Patient uses this medication on Mon, Wed., and Friday.   Yes Historical Provider, MD  albuterol (PROVENTIL HFA;VENTOLIN HFA) 108 (90 BASE) MCG/ACT inhaler Inhale 2 puffs into the lungs every 6 (six) hours as needed for wheezing or shortness of breath. Patient uses this medication prn.    Historical Provider, MD  B Complex-C (B-COMPLEX WITH VITAMIN C) tablet Take 1 tablet by mouth daily.    Historical Provider, MD  budesonide-formoterol (SYMBICORT) 160-4.5 MCG/ACT inhaler Inhale 2 puffs into the lungs 2 (two) times daily.    Historical Provider, MD  cetirizine (ZYRTEC) 10 MG tablet Take 10 mg by mouth daily.    Historical Provider, MD   Physical Exam: Filed Vitals:   10/05/13 1333  BP: 179/68  Pulse: 90  Temp: 101.4 F (38.6 C)  Resp: 20     General: Well developed, well nourished, NAD, appears stated age  HEENT: NCAT, PERRLA, EOMI, Anicteic Sclera, mucous membranes moist. No pharyngeal erythema or  exudates  Neck: Supple, no JVD, no masses  Cardiovascular: S1 S2 auscultated, no rubs, murmurs or gallops. Regular rate and rhythm.  Respiratory: Clear to auscultation bilaterally with equal chest rise  Abdomen: Soft, nontender, nondistended, + bowel sounds  Extremities: warm dry without cyanosis clubbing or edema  Neuro: AAOx3, cranial nerves grossly intact. Strength 5/5 in patient's upper and lower extremities bilaterally  Skin: Without rashes exudates or nodules  Psych: Normal affect and demeanor with intact judgement and insight  Labs on Admission:  Basic Metabolic Panel:  Recent Labs Lab 10/05/13 0540 10/05/13 1146  NA 141  --   K 3.3*  --   CL 106  --   CO2 22  --   GLUCOSE 150*  --   BUN 20  --   CREATININE 1.22* 1.14*  CALCIUM 9.7  --    Liver Function Tests: No results found for this basename: AST, ALT, ALKPHOS, BILITOT, PROT, ALBUMIN,  in the last 168 hours No results found for this basename: LIPASE, AMYLASE,  in  the last 168 hours No results found for this basename: AMMONIA,  in the last 168 hours CBC:  Recent Labs Lab 10/05/13 0540 10/05/13 1146  WBC 11.9* 12.7*  HGB 12.4 12.9  HCT 37.3 38.6  MCV 94.2 94.8  PLT 167 165   Cardiac Enzymes: No results found for this basename: CKTOTAL, CKMB, CKMBINDEX, TROPONINI,  in the last 168 hours  BNP (last 3 results) No results found for this basename: PROBNP,  in the last 8760 hours CBG: No results found for this basename: GLUCAP,  in the last 168 hours  Radiological Exams on Admission: Dg Chest 2 View  10/05/2013   CLINICAL DATA:  Cough and chest pain  EXAM: CHEST  2 VIEW  COMPARISON:  Chest radiograph and chest CT April 23, 2012  FINDINGS: There is a calcified granuloma in the left mid lung, stable. Mild elevation of the right hemidiaphragm is present. There is no edema or consolidation. Heart is borderline enlarged with normal pulmonary vascularity. No adenopathy. There is atherosclerotic change in the aorta. There is degenerative change in the thoracic spine.  IMPRESSION: Mild cardiac enlargement. No edema or consolidation. Calcified left midlung granuloma.   Electronically Signed   By: Lowella Grip M.D.   On: 10/05/2013 07:00    Assessment/Plan Principal Problem:   Sepsis Active Problems:   Renal disorder   Hx of kidney transplant   Hypertension   Thyroid disease   Multiple allergies   Hypothyroidism   Depression   Anxiety   COPD, mild   Diabetes   Urinary tract infection  Sepsis secondary urinary tract infection Patient will be admitted to medical floor. She'll be started on IV ceftriaxone. Pending blood and urine cultures. Patient was noted to have fever with leukocytosis. Patient also has a lactic acid level II.28. Will place patient on IV fluids at 75 cc per hour.   Nausea vomiting Likely secondary to sepsis. Currently resolved.  History of renal transplant, left Currently stable. Creatinine is 1.14. Will continue to  monitor and give IV fluids. Will also continue her medications of azathioprine, cyclosporine, and prednisone. Will continue Bactrim on Monday Wednesdays and Fridays.  Hypothyroidism Will continue thyroxine.   Anxiety and depression Will continue on fluoxetine and WelChol.  Overactive bladder Continue Enablex and mirabegron.  Hypertension Continue amlodipine, clonidine, metoprolol.  Holding Lasix at this time due to sepsis.  COPD  Continue Symbicort  Allergies Continue Flonase and Zyrtec  Hyperlipidemia Continue with  lovaza   DVT prophylaxis: Heparin  Code Status: Full  Condition: Guarded  Family Communication: Husband at bedside. Admission, patients condition and plan of care including tests being ordered have been discussed with the patient and husband who indicate understanding and agree with the plan and Code Status.  Disposition Plan: Admitted   Time spent: 3 Minutes  Malijah Lietz D.O. [facility] Pager [phone]  If 7PM-7AM, please contact night-coverage [email or web address] Password [email or web address] 10/05/2013, 3:19 PM

## 2013-10-05 NOTE — Progress Notes (Signed)
Received report from [place], [place] from ED

## 2013-10-06 LAB — CBC
Hemoglobin: 11.5 g/dL — ABNORMAL LOW (ref 12.0–15.0)
MCHC: 32.5 g/dL (ref 30.0–36.0)
MCV: 96.2 fL (ref 78.0–100.0)
Platelets: 131 10*3/uL — ABNORMAL LOW (ref 150–400)
RDW: 15.2 % (ref 11.5–15.5)

## 2013-10-06 LAB — BASIC METABOLIC PANEL
BUN: 16 mg/dL (ref 6–23)
CO2: 20 mEq/L (ref 19–32)
Calcium: 9.2 mg/dL (ref 8.4–10.5)
GFR calc Af Amer: 40 mL/min — ABNORMAL LOW (ref >90)
GFR calc non Af Amer: 34 mL/min — ABNORMAL LOW (ref >90)
Glucose, Bld: 129 mg/dL — ABNORMAL HIGH (ref 70–99)
Potassium: 3.9 mEq/L (ref 3.5–5.1)
Sodium: 144 mEq/L (ref 135–145)

## 2013-10-06 LAB — LACTIC ACID, PLASMA: Lactic Acid, Venous: 0.9 mmol/L (ref 0.5–2.2)

## 2013-10-06 MED ORDER — POTASSIUM CHLORIDE CRYS ER 20 MEQ PO TBCR
20.0000 meq | EXTENDED_RELEASE_TABLET | Freq: Every day | ORAL | Status: DC
  Administered 2013-10-06 – 2013-10-08 (×3): 20 meq via ORAL
  Filled 2013-10-06 (×7): qty 1

## 2013-10-06 MED ORDER — CEFTRIAXONE SODIUM 2 G IJ SOLR
2.0000 g | INTRAMUSCULAR | Status: DC
  Administered 2013-10-07: 08:00:00 2 g via INTRAVENOUS
  Filled 2013-10-06: qty 2

## 2013-10-06 NOTE — Evaluation (Signed)
Physical Therapy Evaluation Patient Details Name: Patricia [name] MRN: [id number] DOB: March 21, 1932 Today's Date: 10/06/2013 Time: 1000-1040 PT Time Calculation (min): 40 min  PT Assessment / Plan / Recommendation History of Present Illness  79 y.o. female admitted with nausea, vomiting and fever felt secondary to sepsis.  PMH positive for renal transplant, coronary disease, hypertension, thyroid disease.  Clinical Impression  Patient presents with decreased independence with mobility due to deficits listed below and will benefit from skilled PT in the acute setting to allow return home with spouse assist and HHPT.  Spoke directly to spouse about his ability to help her with mobility in the home and he states he can provide help whenever she is on her feet.  Hope she can improve more prior to d/c to decrease burden of care.    PT Assessment  Patient needs continued PT services    Follow Up Recommendations  Home health PT;Supervision/Assistance - 24 hour    Does the patient have the potential to tolerate intense rehabilitation    N/A  Barriers to Discharge  Steps to enter      Equipment Recommendations  None recommended by PT    Recommendations for Other Services OT consult   Frequency Min 3X/week    Precautions / Restrictions Precautions Precautions: Fall Restrictions Weight Bearing Restrictions: No   Pertinent Vitals/Pain No pain complaints      Mobility  Bed Mobility Bed Mobility: Rolling Right;Right Sidelying to Sit;Sitting - Scoot to Edge of Bed Rolling Right: With rail;5: Supervision Right Sidelying to Sit: HOB elevated;With rails;5: Supervision Sitting - Scoot to Edge of Bed: With rail;4: Min assist Details for Bed Mobility Assistance: cues for technique  Transfers Transfers: Sit to Stand;Stand to Sit Sit to Stand: 4: Min assist;From elevated surface;From bed Stand to Sit: 4: Min assist;To chair/3-in-1;With armrests Details for Transfer Assistance: cues for hand  placement; increased time needed  Ambulation/Gait Ambulation/Gait Assistance: 4: Min assist;3: Mod assist Ambulation Distance (Feet): 12 Feet Assistive device: Rolling walker Ambulation/Gait Assistance Details: 6'fwd and 6' back due to pt c/c nausea just after meds.  assist to move walker esp with backing up Gait Pattern: Step-to pattern;Decreased stride length;Trunk flexed;Shuffle    Exercises     PT Diagnosis: Abnormality of gait;Generalized weakness  PT Problem List: Decreased strength;Decreased knowledge of use of DME;Decreased activity tolerance;Decreased safety awareness;Decreased balance;Decreased mobility;Decreased knowledge of precautions PT Treatment Interventions: DME instruction;Balance training;Gait training;Stair training;Functional mobility training;Patient/family education;Therapeutic activities;Therapeutic exercise     PT Goals(Current goals can be found in the care plan section) Acute Rehab PT Goals [name] PT Goal Formulation: With patient/family Time For Goal Achievement: 10/20/13 Potential to Achieve Goals: Good  Visit Information  Last PT Received On: 10/06/13 Assistance Needed: +1 History of Present Illness: 79 y.o. female admitted with nausea, vomiting and fever felt secondary to sepsis.  PMH positive for renal transplant, coronary disease, hypertension, thyroid disease.       Prior [place] expects to be discharged to:: Private residence Living Arrangements: Spouse/significant other Available Help at Discharge: Family;Available 24 hours/day Type of Home: House Home Access: Stairs to enter [employer] of Steps: 5 Entrance Stairs-Rails: Right;Left Home Layout: Multi-level;Able to live on main level with bedroom/bathroom Alternate Level Stairs-Number of Steps: n/a; stays on main Home Equipment: Walker - 4 wheels;Bedside commode;Shower seat;Grab bars - tub/shower;Transport  chair Prior Function Level of Independence: Needs assistance Gait / Transfers Assistance Needed: occasional assist up from couch ADL's /  Homemaking Assistance Needed: assist to get into tub Communication Communication: No difficulties    Cognition  Cognition Arousal/Alertness: Awake/alert Behavior During Therapy: WFL for tasks assessed/performed Overall Cognitive Status: Within Functional Limits for tasks assessed    Extremity/Trunk Assessment Lower Extremity Assessment Lower Extremity Assessment: RLE deficits/detail;LLE deficits/detail RLE Deficits / Details: AAROM WFL except ankle DF limited, strength hip flexion 3-/5, knee exension 4-/5 LLE Deficits / Details: AAROM WFL except ankle DF limited, strength hip flexion 3/5, knee exension 4/5   Balance Balance Balance Assessed: Yes Static Sitting Balance Static Sitting - Balance Support: Bilateral upper extremity supported Static Sitting - Level of Assistance: 5: Stand by assistance Static Standing Balance Static Standing - Balance Support: Bilateral upper extremity supported Static Standing - Level of Assistance: 4: Min assist Static Standing - Comment/# of Minutes: shaky up on her feet  End of Session PT - End of Session Equipment Utilized During Treatment: Gait belt Activity Tolerance: Patient limited by fatigue Patient left: in chair;with call bell/phone within reach;with family/visitor present Nurse Communication: Mobility status  GP     [facility] 10/06/2013, 11:38 AM Magda Kiel, [place] 10/06/2013

## 2013-10-06 NOTE — Progress Notes (Signed)
Re-checked pt temp down to 98.5. Will continue to monitor.

## 2013-10-06 NOTE — Progress Notes (Signed)
Pt has elevated temp 101.9. Given PRn 2tabs tylenol. Will re-check temp in an hr. Will continue to monitor.

## 2013-10-06 NOTE — Care Management Note (Unsigned)
    Page 1 of 1   10/06/2013     4:43:14 PM   CARE MANAGEMENT NOTE 10/06/2013  Patient:  Patricia [name], Patricia [name]   Account Number:  [id number]  Date Initiated:  10/06/2013  Documentation initiated by:  Tomi Bamberger  Subjective/Objective Assessment:   dx sepsis  admit- lives with spouse.     Action/Plan:   pt eval - rec hhpt   Anticipated DC Date:  10/08/2013   Anticipated DC Plan:  [place]  CM consult      [facility] Choice  HOME HEALTH   Choice offered to / List presented to:  C-1 Patient        [place] arranged  [place] [facility]      [place].   Status of service:  In process, will continue to follow Medicare Important Message given?   (If response is "NO", the following Medicare IM given date fields will be blank) Date Medicare IM given:   Date Additional Medicare IM given:    Discharge Disposition:    Per UR Regulation:  Reviewed for med. necessity/level of care/duration of stay  If discussed at [place] of Stay Meetings, dates discussed:    Comments:  10/06/13 16:40 Tomi Bamberger RN, BSN [phone] per physical therapy recs hhpt, patient chose [facility] from agency list, referral made to [facility] , [place] notified.  Soc will begin 24-48 hrs post discharge.

## 2013-10-06 NOTE — Progress Notes (Signed)
CRITICAL VALUE ALERT  Critical value received:  Gram negative rods in blood culture  Date of notification:  10/06/2013   Time of notification:  1:56 PM   Critical value read back:yes  Nurse who received alert:  Wonda Cerise ,  RN  MD notified (1st page):  Ghimire  Time of first page:  1:57 PM   MD notified (2nd page):  Time of second page:  Responding MD:  ghimire  Time MD responded:  1:57 PM

## 2013-10-06 NOTE — Progress Notes (Signed)
PATIENT DETAILS Name: Patricia [name] Age: 79 y.o. Sex: female Date of Birth: August 17, 1932 Admit Date: 10/05/2013 Admitting Physician Cristal Ford, DO WE:[id number] Marcello Moores, MD  Subjective: Feels slightly better. No vomiting since admission.  Assessment/Plan: Principal Problem:   Sepsis -Secondary to UTI -c/w Rocephin, await cultures  UTI -Some clinical improvement, but still febrile -continue with Rocephin-day 2 -Urine culture 12/22-E Coli-sensitivity pending -Blood culture 12/22-no growth to date  Vomiting -secondary to above -resolved with supportive care -monitor and follow  History of renal transplant, left -creatinine stable-close to baseline -c/w Imuran, Cyclosporine and prednisone -also on Prophylactic Bactrim 3/week  Hx of unsteady gait/tremors -chronic issue per patient-see's [facility] as outpatient -PT eval -will defer further work up to the outpatient setting-unless clinical situation changes  Overactive bladder  -Continue Enablex and mirabegron.  Anxiety and depression  -Will continue on fluoxetine and WelChol  Hypothyroidism  -Will continue thyroxine.  Hypertension  -Controlled -Continue amlodipine, clonidine, metoprolol. Holding Lasix at this time due to sepsis.  COPD  -lungs clear -Continue Symbicort -prn Albuterol MDI  Disposition: Remain inpatient  DVT Prophylaxis: Prophylactic  Heparin   Code Status: Full code or DNR  Family Communication None at bedside  Procedures:  None  CONSULTS:  None  Time spent 40 minutes-which includes 50% of the time with face-to-face with patient  MEDICATIONS: Scheduled Meds: . amLODipine  5 mg Oral Daily  . azaTHIOprine  50 mg Oral BID  . B-complex with vitamin C  1 tablet Oral Daily  . budesonide-formoterol  2 puff Inhalation BID  . cefTRIAXone (ROCEPHIN)  IV  1 g Intravenous Q24H  . cloNIDine  0.1 mg Oral BID  . colesevelam  1,875 mg Oral BID WC  . cycloSPORINE   100 mg Oral BID  . darifenacin  7.5 mg Oral Daily  . fenofibrate  54 mg Oral Daily  . FLUoxetine  20 mg Oral Daily  . heparin  5,000 Units Subcutaneous Q8H  . levothyroxine  75 mcg Oral Daily  . loratadine  10 mg Oral Daily  . metoprolol succinate  100 mg Oral Daily  . mirabegron ER  25 mg Oral Daily  . multivitamin  1 tablet Oral Daily  . omega-3 acid ethyl esters  1 g Oral BID  . ondansetron  8 mg Oral Once  . potassium chloride  20 mEq Oral Daily  . predniSONE  5 mg Oral Q breakfast  . sodium chloride  3 mL Intravenous Q12H  . sulfamethoxazole-trimethoprim  1 tablet Oral 3 times weekly   Continuous Infusions:  PRN Meds:.acetaminophen, acetaminophen, albuterol, fluticasone, hyoscyamine, morphine injection, ondansetron  Antibiotics: Anti-infectives   Start     Dose/Rate Route Frequency Ordered Stop   10/06/13 0800  cefTRIAXone (ROCEPHIN) 1 g in dextrose 5 % 50 mL IVPB     1 g 100 mL/hr over 30 Minutes Intravenous Every 24 hours 10/05/13 0955     10/05/13 1000  sulfamethoxazole-trimethoprim (BACTRIM,SEPTRA) 400-80 MG per tablet 1 tablet     1 tablet Oral Once per day on Mon Wed Fri 10/05/13 0952     10/05/13 0730  cefTRIAXone (ROCEPHIN) 1 g in dextrose 5 % 50 mL IVPB     1 g 100 mL/hr over 30 Minutes Intravenous  Once 10/05/13 0722 10/05/13 0831       PHYSICAL EXAM: Vital signs in last 24 hours: Filed Vitals:   10/06/13 0500 10/06/13 0533 10/06/13 0723 10/06/13 1013  BP:  139/80  135/84  Pulse:  77 72  Temp:  100.5 F (38.1 C)    TempSrc:  Oral    Resp:  22 18   Height:      Weight: 82.555 kg (182 lb)     SpO2:  91% 94%     Weight change:  Filed Weights   10/05/13 0926 10/06/13 0500  Weight: 82.101 kg (181 lb) 82.555 kg (182 lb)   Body mass index is 31.22 kg/(m^2).   Gen Exam: Awake and alert with clear speech.Slow but answers all questions appropriately   Neck: Supple, No JVD.   Chest: B/L Clear.   CVS: S1 S2 Regular, no murmurs.  Abdomen: soft, BS +,  non tender, non distended.  Extremities: no edema, lower extremities warm to touch. Neurologic: Non Focal-but with gen weakness. +Tremors Skin: No Rash.   Wounds: N/A.   Intake/Output from previous day:  Intake/Output Summary (Last 24 hours) at 10/06/13 1336 Last data filed at 10/06/13 1012  Gross per 24 hour  Intake 1982.5 ml  Output      0 ml  Net 1982.5 ml     LAB RESULTS: CBC  Recent Labs Lab 10/05/13 0540 10/05/13 1146 10/06/13 0540  WBC 11.9* 12.7* 12.9*  HGB 12.4 12.9 11.5*  HCT 37.3 38.6 35.4*  PLT 167 165 131*  MCV 94.2 94.8 96.2  MCH 31.3 31.7 31.3  MCHC 33.2 33.4 32.5  RDW 14.4 14.7 15.2    Chemistries   Recent Labs Lab 10/05/13 0540 10/05/13 1146 10/06/13 0540  NA 141  --  144  K 3.3*  --  3.9  CL 106  --  111  CO2 22  --  20  GLUCOSE 150*  --  129*  BUN 20  --  16  CREATININE 1.22* 1.14* 1.41*  CALCIUM 9.7  --  9.2    CBG: No results found for this basename: GLUCAP,  in the last 168 hours  GFR Estimated Creatinine Clearance: 33.7 ml/min (by C-G formula based on Cr of 1.41).  Coagulation profile No results found for this basename: INR, PROTIME,  in the last 168 hours  Cardiac Enzymes No results found for this basename: CK, CKMB, TROPONINI, MYOGLOBIN,  in the last 168 hours  No components found with this basename: POCBNP,  No results found for this basename: DDIMER,  in the last 72 hours No results found for this basename: HGBA1C,  in the last 72 hours No results found for this basename: CHOL, HDL, LDLCALC, TRIG, CHOLHDL, LDLDIRECT,  in the last 72 hours No results found for this basename: TSH, T4TOTAL, FREET3, T3FREE, THYROIDAB,  in the last 72 hours No results found for this basename: VITAMINB12, FOLATE, FERRITIN, TIBC, IRON, RETICCTPCT,  in the last 72 hours No results found for this basename: LIPASE, AMYLASE,  in the last 72 hours  Urine Studies No results found for this basename: UACOL, UAPR, USPG, UPH, UTP, UGL, UKET, UBIL,  UHGB, UNIT, UROB, ULEU, UEPI, UWBC, URBC, UBAC, CAST, CRYS, UCOM, BILUA,  in the last 72 hours  MICROBIOLOGY: Recent Results (from the past 240 hour(s))  CULTURE, BLOOD (ROUTINE X 2)     Status: None   Collection Time    10/05/13  5:40 AM      Result Value Range Status   Specimen Description BLOOD RIGHT ANTECUBITAL   Final   Special Requests BOTTLES DRAWN AEROBIC AND ANAEROBIC 5CC   Final   Culture  Setup Time     Final   Value: 10/05/2013 14:11     Performed  at [facility]   Culture     Final   Value:        BLOOD CULTURE RECEIVED NO GROWTH TO DATE CULTURE WILL BE HELD FOR 5 DAYS BEFORE ISSUING A FINAL NEGATIVE REPORT     Performed at [facility]   Report Status PENDING   Incomplete  CULTURE, BLOOD (ROUTINE X 2)     Status: None   Collection Time    10/05/13  6:15 AM      Result Value Range Status   Specimen Description BLOOD HAND RIGHT   Final   Special Requests BOTTLES DRAWN AEROBIC AND ANAEROBIC 5CC   Final   Culture  Setup Time     Final   Value: 10/05/2013 14:12     Performed at [facility]   Culture     Final   Value:        BLOOD CULTURE RECEIVED NO GROWTH TO DATE CULTURE WILL BE HELD FOR 5 DAYS BEFORE ISSUING A FINAL NEGATIVE REPORT     Performed at [facility]   Report Status PENDING   Incomplete  URINE CULTURE     Status: None   Collection Time    10/05/13  6:21 AM      Result Value Range Status   Specimen Description URINE, RANDOM   Final   Special Requests NONE   Final   Culture  Setup Time     Final   Value: 10/05/2013 07:09     Performed at [place]     Final   Value: >=100,000 COLONIES/ML     Performed at [facility]   Culture     Final   Value: ESCHERICHIA COLI     Performed at [facility]   Report Status PENDING   Incomplete  CULTURE, BLOOD (ROUTINE X 2)     Status: None   Collection Time    10/05/13  3:37 PM      Result Value Range Status   Specimen Description BLOOD  RIGHT HAND   Final   Special Requests     Final   Value: BOTTLES DRAWN AEROBIC AND ANAEROBIC 10CC AER,6CC ANA   Culture  Setup Time     Final   Value: 10/05/2013 22:08     Performed at [facility]   Culture     Final   Value:        BLOOD CULTURE RECEIVED NO GROWTH TO DATE CULTURE WILL BE HELD FOR 5 DAYS BEFORE ISSUING A FINAL NEGATIVE REPORT     Performed at [facility]   Report Status PENDING   Incomplete  CULTURE, BLOOD (ROUTINE X 2)     Status: None   Collection Time    10/05/13  3:42 PM      Result Value Range Status   Specimen Description BLOOD LEFT HAND   Final   Special Requests BOTTLES DRAWN AEROBIC ONLY 3CC   Final   Culture  Setup Time     Final   Value: 10/05/2013 22:00     Performed at [facility]   Culture     Final   Value:        BLOOD CULTURE RECEIVED NO GROWTH TO DATE CULTURE WILL BE HELD FOR 5 DAYS BEFORE ISSUING A FINAL NEGATIVE REPORT     Performed at [facility]   Report Status PENDING   Incomplete    RADIOLOGY STUDIES/RESULTS:  Dg Chest 2 View  10/05/2013   CLINICAL DATA:  Cough and chest pain  EXAM: CHEST  2 VIEW  COMPARISON:  Chest radiograph and chest CT April 23, 2012  FINDINGS: There is a calcified granuloma in the left mid lung, stable. Mild elevation of the right hemidiaphragm is present. There is no edema or consolidation. Heart is borderline enlarged with normal pulmonary vascularity. No adenopathy. There is atherosclerotic change in the aorta. There is degenerative change in the thoracic spine.  IMPRESSION: Mild cardiac enlargement. No edema or consolidation. Calcified left midlung granuloma.   Electronically Signed   By: Lowella Grip M.D.   On: 10/05/2013 07:00    Oren Binet, MD  [facility] Pager:[phone]  If 7PM-7AM, please contact night-coverage [email or web address] Password [email or web address] 10/06/2013, 1:36 PM   LOS: 1 day

## 2013-10-07 DIAGNOSIS — R269 Unspecified abnormalities of gait and mobility: Secondary | ICD-10-CM

## 2013-10-07 DIAGNOSIS — A498 Other bacterial infections of unspecified site: Secondary | ICD-10-CM | POA: Diagnosis present

## 2013-10-07 DIAGNOSIS — A4151 Sepsis due to Escherichia coli [E. coli]: Secondary | ICD-10-CM | POA: Diagnosis present

## 2013-10-07 LAB — URINE CULTURE

## 2013-10-07 MED ORDER — DOCUSATE SODIUM 100 MG PO CAPS
200.0000 mg | ORAL_CAPSULE | Freq: Every day | ORAL | Status: DC
  Administered 2013-10-07 – 2013-10-08 (×2): 200 mg via ORAL
  Filled 2013-10-07 (×3): qty 2

## 2013-10-07 MED ORDER — SODIUM CHLORIDE 0.9 % IV SOLN
250.0000 mg | Freq: Four times a day (QID) | INTRAVENOUS | Status: DC
  Administered 2013-10-07 – 2013-10-09 (×8): 250 mg via INTRAVENOUS
  Filled 2013-10-07 (×10): qty 250

## 2013-10-07 MED ORDER — ALUM & MAG HYDROXIDE-SIMETH 200-200-20 MG/5ML PO SUSP
15.0000 mL | Freq: Four times a day (QID) | ORAL | Status: DC | PRN
  Administered 2013-10-08: 12:00:00 15 mL via ORAL
  Filled 2013-10-07: qty 30

## 2013-10-07 NOTE — Progress Notes (Signed)
ANTIBIOTIC CONSULT NOTE - INITIAL  Pharmacy Consult for primaxin Indication: ESBL UTI/bacteremia  Allergies  Allergen Reactions  . Aricept [Donepezil Hcl] Nausea And Vomiting  . Codeine Nausea And Vomiting  . Penicillins Other (See Comments)    Doesn't remember  . Statins Other (See Comments)    Doesn't remember  . Tetracyclines & Related Rash    Patient Measurements: Height: 5\' 4"  (162.6 cm) Weight: 182 lb 1.6 oz (82.6 kg) IBW/kg (Calculated) : 54.7 Adjusted Body Weight:   Vital Signs: Temp: 98.2 F (36.8 C) (12/24 0507) Temp src: Oral (12/24 0507) BP: 121/87 mmHg (12/24 1023) Pulse Rate: 58 (12/24 0507) Intake/Output from previous day: 12/23 0701 - 12/24 0700 In: 1982.5 [P.O.:240; I.V.:1642.5; IV Piggyback:100] Out: -  Intake/Output from this shift:    Labs:  Recent Labs  10/05/13 0540 10/05/13 1146 10/06/13 0540  WBC 11.9* 12.7* 12.9*  HGB 12.4 12.9 11.5*  PLT 167 165 131*  CREATININE 1.22* 1.14* 1.41*   Estimated Creatinine Clearance: 33.7 ml/min (by C-G formula based on Cr of 1.41). No results found for this basename: VANCOTROUGH, VANCOPEAK, VANCORANDOM, [place], [place], [place], [place], [place], TOBRARND, AMIKACINPEAK, AMIKACINTROU, AMIKACIN,  in the last 72 hours   Microbiology: Recent Results (from the past 720 hour(s))  CULTURE, BLOOD (ROUTINE X 2)     Status: None   Collection Time    10/05/13  5:40 AM      Result Value Range Status   Specimen Description BLOOD RIGHT ANTECUBITAL   Final   Special Requests BOTTLES DRAWN AEROBIC AND ANAEROBIC 5CC   Final   Culture  Setup Time     Final   Value: 10/05/2013 14:11     Performed at [facility]   Culture     Final   Value:        BLOOD CULTURE RECEIVED NO GROWTH TO DATE CULTURE WILL BE HELD FOR 5 DAYS BEFORE ISSUING A FINAL NEGATIVE REPORT     Performed at [facility]   Report Status PENDING   Incomplete  CULTURE, BLOOD (ROUTINE X 2)     Status: None   Collection  Time    10/05/13  6:15 AM      Result Value Range Status   Specimen Description BLOOD HAND RIGHT   Final   Special Requests BOTTLES DRAWN AEROBIC AND ANAEROBIC 5CC   Final   Culture  Setup Time     Final   Value: 10/05/2013 14:12     Performed at [facility]   Culture     Final   Value:        BLOOD CULTURE RECEIVED NO GROWTH TO DATE CULTURE WILL BE HELD FOR 5 DAYS BEFORE ISSUING A FINAL NEGATIVE REPORT     Performed at [facility]   Report Status PENDING   Incomplete  URINE CULTURE     Status: None   Collection Time    10/05/13  6:21 AM      Result Value Range Status   Specimen Description URINE, RANDOM   Final   Special Requests NONE   Final   Culture  Setup Time     Final   Value: 10/05/2013 07:09     Performed at [place]     Final   Value: >=100,000 COLONIES/ML     Performed at [facility]   Culture     Final   Value: ESCHERICHIA COLI     Note: Confirmed Extended  Spectrum Beta-Lactamase Producer (ESBL) CRITICAL RESULT CALLED TO, READ BACK BY AND VERIFIED WITH: Gavin Potters [facility] 10/07/13 AT 100 PM BY [facility]     Performed at [facility]   Report Status 10/07/2013 FINAL   Final   Organism ID, Bacteria ESCHERICHIA COLI   Final  CULTURE, BLOOD (ROUTINE X 2)     Status: None   Collection Time    10/05/13  3:37 PM      Result Value Range Status   Specimen Description BLOOD RIGHT HAND   Final   Special Requests     Final   Value: BOTTLES DRAWN AEROBIC AND ANAEROBIC 10CC AER,6CC ANA   Culture  Setup Time     Final   Value: 10/05/2013 22:08     Performed at [facility]   Culture     Final   Value: ESCHERICHIA COLI     Note: Gram Stain Report Called to,Read Back By and Verified With: Wonda Cerise @ 1349 10/06/13 BY KRAWS     Performed at [facility]   Report Status PENDING   Incomplete  CULTURE, BLOOD (ROUTINE X 2)     Status: None   Collection Time    10/05/13  3:42 PM      Result Value Range  Status   Specimen Description BLOOD LEFT HAND   Final   Special Requests BOTTLES DRAWN AEROBIC ONLY 3CC   Final   Culture  Setup Time     Final   Value: 10/05/2013 22:00     Performed at [facility]   Culture     Final   Value:        BLOOD CULTURE RECEIVED NO GROWTH TO DATE CULTURE WILL BE HELD FOR 5 DAYS BEFORE ISSUING A FINAL NEGATIVE REPORT     Performed at [facility]   Report Status PENDING   Incomplete    Medical History: Past Medical History  Diagnosis Date  . Renal disorder   . Hx of kidney transplant     right  . Coronary artery disease   . Hypertension   . Thyroid disease   . Multiple allergies   . Abnormal gait   . Reflux   . Diverticulitis   . Hypothyroidism   . Depression   . Anxiety   . COPD, mild   . Dizziness   . Diabetes     Medications:  Anti-infectives   Start     Dose/Rate Route Frequency Ordered Stop   10/07/13 1400  imipenem-cilastatin (PRIMAXIN) 250 mg in sodium chloride 0.9 % 100 mL IVPB     250 mg 200 mL/hr over 30 Minutes Intravenous Every 6 hours 10/07/13 1313     10/07/13 0800  cefTRIAXone (ROCEPHIN) 2 g in dextrose 5 % 50 mL IVPB  Status:  Discontinued     2 g 100 mL/hr over 30 Minutes Intravenous Every 24 hours 10/06/13 1437 10/07/13 1301   10/06/13 0800  cefTRIAXone (ROCEPHIN) 1 g in dextrose 5 % 50 mL IVPB  Status:  Discontinued     1 g 100 mL/hr over 30 Minutes Intravenous Every 24 hours 10/05/13 0955 10/06/13 1437   10/05/13 1000  sulfamethoxazole-trimethoprim (BACTRIM,SEPTRA) 400-80 MG per tablet 1 tablet     1 tablet Oral Once per day on Mon Wed Fri 10/05/13 0952     10/05/13 0730  cefTRIAXone (ROCEPHIN) 1 g in dextrose 5 % 50 mL IVPB     1 g 100 mL/hr over 30 Minutes Intravenous  Once 10/05/13 [id number] 10/05/13 0831     Assessment: 76 yof presented to the hospital with a fever and vomiting. Initially started on empiric ceftriaxone. However, cultures resulted today with ESBL E.Coli. Changing antibiotics to  primaxin.  Primaxin 12/24>> CTX 12/22> 12/24  12/22 Urine - >100K ESBL Ecoli 12/22 Blood - 1/3 Ecoli  Goal of Therapy:  Eradication of infection  Plan:  1. Primaxin 250mg  IV Q6H 2. F/u renal fxn, C&S, clinical status   Sheralyn Pinegar, Rande Lawman 10/07/2013,1:14 PM

## 2013-10-07 NOTE — Progress Notes (Signed)
PATIENT DETAILS Name: Patricia [name] Age: 79 y.o. Sex: female Date of Birth: 1931-11-01 Admit Date: 10/05/2013 Admitting Physician Cristal Ford, DO WE:[id number] Marcello Moores, MD  Subjective: Feels much better, husband at bedside.  Assessment/Plan: Principal Problem:  ESBL Escherichia coli septicemia -Urine culture is positive for ESBL Escherichia coli -Blood culture so far is growing Escherichia coli likely is going to be ESBL.  UTI -Urine culture showed ESBL Escherichia coli, this is probably why patient spiking fever on Rocephin. -Antibiotic switched to Primaxin.  Vomiting -secondary to above -resolved with supportive care -monitor and follow  History of renal transplant, left -creatinine stable-close to baseline -c/w Imuran, Cyclosporine and prednisone -also on Prophylactic Bactrim 3/week  Hx of unsteady gait/tremors -chronic issue per patient-see's [facility] as outpatient -PT eval -will defer further work up to the outpatient setting-unless clinical situation changes  Overactive bladder  -Continue Enablex and mirabegron.  Anxiety and depression  -Will continue on fluoxetine and WelChol  Hypothyroidism  -Will continue thyroxine.  Hypertension  -Controlled -Continue amlodipine, clonidine, metoprolol. Holding Lasix at this time due to sepsis.  COPD  -lungs clear -Continue Symbicort -prn Albuterol MDI  Disposition: Remain inpatient  DVT Prophylaxis: Prophylactic  Heparin   Code Status: Full code or DNR  Family Communication None at bedside  Procedures:  None  CONSULTS:  None  Time spent 40 minutes-which includes 50% of the time with face-to-face with patient  MEDICATIONS: Scheduled Meds: . amLODipine  5 mg Oral Daily  . azaTHIOprine  50 mg Oral BID  . B-complex with vitamin C  1 tablet Oral Daily  . budesonide-formoterol  2 puff Inhalation BID  . cloNIDine  0.1 mg Oral BID  . colesevelam  1,875 mg Oral BID WC  .  cycloSPORINE  100 mg Oral BID  . darifenacin  7.5 mg Oral Daily  . fenofibrate  54 mg Oral Daily  . FLUoxetine  20 mg Oral Daily  . heparin  5,000 Units Subcutaneous Q8H  . imipenem-cilastatin  250 mg Intravenous Q6H  . levothyroxine  75 mcg Oral Daily  . loratadine  10 mg Oral Daily  . metoprolol succinate  100 mg Oral Daily  . mirabegron ER  25 mg Oral Daily  . multivitamin  1 tablet Oral Daily  . omega-3 acid ethyl esters  1 g Oral BID  . ondansetron  8 mg Oral Once  . potassium chloride  20 mEq Oral Daily  . predniSONE  5 mg Oral Q breakfast  . sodium chloride  3 mL Intravenous Q12H  . sulfamethoxazole-trimethoprim  1 tablet Oral 3 times weekly   Continuous Infusions:  PRN Meds:.acetaminophen, acetaminophen, albuterol, fluticasone, hyoscyamine, morphine injection, ondansetron  Antibiotics: Anti-infectives   Start     Dose/Rate Route Frequency Ordered Stop   10/07/13 1400  imipenem-cilastatin (PRIMAXIN) 250 mg in sodium chloride 0.9 % 100 mL IVPB     250 mg 200 mL/hr over 30 Minutes Intravenous Every 6 hours 10/07/13 1313     10/07/13 0800  cefTRIAXone (ROCEPHIN) 2 g in dextrose 5 % 50 mL IVPB  Status:  Discontinued     2 g 100 mL/hr over 30 Minutes Intravenous Every 24 hours 10/06/13 1437 10/07/13 1301   10/06/13 0800  cefTRIAXone (ROCEPHIN) 1 g in dextrose 5 % 50 mL IVPB  Status:  Discontinued     1 g 100 mL/hr over 30 Minutes Intravenous Every 24 hours 10/05/13 0955 10/06/13 1437   10/05/13 1000  sulfamethoxazole-trimethoprim (BACTRIM,SEPTRA) 400-80 MG per tablet 1  tablet     1 tablet Oral Once per day on Mon Wed Fri 10/05/13 0952     10/05/13 0730  cefTRIAXone (ROCEPHIN) 1 g in dextrose 5 % 50 mL IVPB     1 g 100 mL/hr over 30 Minutes Intravenous  Once 10/05/13 0722 10/05/13 0831       PHYSICAL EXAM: Vital signs in last 24 hours: Filed Vitals:   10/07/13 0519 10/07/13 1023 10/07/13 1100 10/07/13 1419  BP: 115/65 121/87  113/59  Pulse:    67  Temp:    98.6 F (37  C)  TempSrc:    Oral  Resp:    18  Height:      Weight:      SpO2:   95% 94%    Weight change: 0.499 kg (1 lb 1.6 oz) Filed Weights   10/05/13 0926 10/06/13 0500 10/07/13 0458  Weight: 82.101 kg (181 lb) 82.555 kg (182 lb) 82.6 kg (182 lb 1.6 oz)   Body mass index is 31.24 kg/(m^2).   Gen Exam: Awake and alert with clear speech.Slow but answers all questions appropriately   Neck: Supple, No JVD.   Chest: B/L Clear.   CVS: S1 S2 Regular, no murmurs.  Abdomen: soft, BS +, non tender, non distended.  Extremities: no edema, lower extremities warm to touch. Neurologic: Non Focal-but with gen weakness. +Tremors Skin: No Rash.   Wounds: N/A.   Intake/Output from previous day: No intake or output data in the 24 hours ending 10/07/13 1535   LAB RESULTS: CBC  Recent Labs Lab 10/05/13 0540 10/05/13 1146 10/06/13 0540  WBC 11.9* 12.7* 12.9*  HGB 12.4 12.9 11.5*  HCT 37.3 38.6 35.4*  PLT 167 165 131*  MCV 94.2 94.8 96.2  MCH 31.3 31.7 31.3  MCHC 33.2 33.4 32.5  RDW 14.4 14.7 15.2    Chemistries   Recent Labs Lab 10/05/13 0540 10/05/13 1146 10/06/13 0540  NA 141  --  144  K 3.3*  --  3.9  CL 106  --  111  CO2 22  --  20  GLUCOSE 150*  --  129*  BUN 20  --  16  CREATININE 1.22* 1.14* 1.41*  CALCIUM 9.7  --  9.2    CBG: No results found for this basename: GLUCAP,  in the last 168 hours  GFR Estimated Creatinine Clearance: 33.7 ml/min (by C-G formula based on Cr of 1.41).  Coagulation profile No results found for this basename: INR, PROTIME,  in the last 168 hours  Cardiac Enzymes No results found for this basename: CK, CKMB, TROPONINI, MYOGLOBIN,  in the last 168 hours  No components found with this basename: POCBNP,  No results found for this basename: DDIMER,  in the last 72 hours No results found for this basename: HGBA1C,  in the last 72 hours No results found for this basename: CHOL, HDL, LDLCALC, TRIG, CHOLHDL, LDLDIRECT,  in the last 72 hours No  results found for this basename: TSH, T4TOTAL, FREET3, T3FREE, THYROIDAB,  in the last 72 hours No results found for this basename: VITAMINB12, FOLATE, FERRITIN, TIBC, IRON, RETICCTPCT,  in the last 72 hours No results found for this basename: LIPASE, AMYLASE,  in the last 72 hours  Urine Studies No results found for this basename: UACOL, UAPR, USPG, UPH, UTP, UGL, UKET, UBIL, UHGB, UNIT, UROB, ULEU, UEPI, UWBC, URBC, UBAC, CAST, CRYS, UCOM, BILUA,  in the last 72 hours  MICROBIOLOGY: Recent Results (from the past 240 hour(s))  CULTURE, BLOOD (ROUTINE X 2)     Status: None   Collection Time    10/05/13  5:40 AM      Result Value Range Status   Specimen Description BLOOD RIGHT ANTECUBITAL   Final   Special Requests BOTTLES DRAWN AEROBIC AND ANAEROBIC 5CC   Final   Culture  Setup Time     Final   Value: 10/05/2013 14:11     Performed at [facility]   Culture     Final   Value:        BLOOD CULTURE RECEIVED NO GROWTH TO DATE CULTURE WILL BE HELD FOR 5 DAYS BEFORE ISSUING A FINAL NEGATIVE REPORT     Performed at [facility]   Report Status PENDING   Incomplete  CULTURE, BLOOD (ROUTINE X 2)     Status: None   Collection Time    10/05/13  6:15 AM      Result Value Range Status   Specimen Description BLOOD HAND RIGHT   Final   Special Requests BOTTLES DRAWN AEROBIC AND ANAEROBIC 5CC   Final   Culture  Setup Time     Final   Value: 10/05/2013 14:12     Performed at [facility]   Culture     Final   Value:        BLOOD CULTURE RECEIVED NO GROWTH TO DATE CULTURE WILL BE HELD FOR 5 DAYS BEFORE ISSUING A FINAL NEGATIVE REPORT     Performed at [facility]   Report Status PENDING   Incomplete  URINE CULTURE     Status: None   Collection Time    10/05/13  6:21 AM      Result Value Range Status   Specimen Description URINE, RANDOM   Final   Special Requests NONE   Final   Culture  Setup Time     Final   Value: 10/05/2013 07:09     Performed at [facility]     Final   Value: >=100,000 COLONIES/ML     Performed at [facility]   Culture     Final   Value: ESCHERICHIA COLI     Note: Confirmed Extended Spectrum Beta-Lactamase Producer (ESBL) CRITICAL RESULT CALLED TO, READ BACK BY AND VERIFIED WITH: Gavin Potters [facility] 10/07/13 AT 100 PM BY [facility]     Performed at [facility]   Report Status 10/07/2013 FINAL   Final   Organism ID, Bacteria ESCHERICHIA COLI   Final  CULTURE, BLOOD (ROUTINE X 2)     Status: None   Collection Time    10/05/13  3:37 PM      Result Value Range Status   Specimen Description BLOOD RIGHT HAND   Final   Special Requests     Final   Value: BOTTLES DRAWN AEROBIC AND ANAEROBIC 10CC AER,6CC ANA   Culture  Setup Time     Final   Value: 10/05/2013 22:08     Performed at [facility]   Culture     Final   Value: ESCHERICHIA COLI     Note: Gram Stain Report Called to,Read Back By and Verified With: Wonda Cerise @ 1349 10/06/13 BY KRAWS     Performed at [facility]   Report Status PENDING   Incomplete  CULTURE, BLOOD (ROUTINE X 2)     Status: None   Collection Time    10/05/13  3:42 PM  Result Value Range Status   Specimen Description BLOOD LEFT HAND   Final   Special Requests BOTTLES DRAWN AEROBIC ONLY 3CC   Final   Culture  Setup Time     Final   Value: 10/05/2013 22:00     Performed at [facility]   Culture     Final   Value:        BLOOD CULTURE RECEIVED NO GROWTH TO DATE CULTURE WILL BE HELD FOR 5 DAYS BEFORE ISSUING A FINAL NEGATIVE REPORT     Performed at [facility]   Report Status PENDING   Incomplete    RADIOLOGY STUDIES/RESULTS: Dg Chest 2 View  10/05/2013   CLINICAL DATA:  Cough and chest pain  EXAM: CHEST  2 VIEW  COMPARISON:  Chest radiograph and chest CT April 23, 2012  FINDINGS: There is a calcified granuloma in the left mid lung, stable. Mild elevation of the right hemidiaphragm is present. There is no edema or  consolidation. Heart is borderline enlarged with normal pulmonary vascularity. No adenopathy. There is atherosclerotic change in the aorta. There is degenerative change in the thoracic spine.  IMPRESSION: Mild cardiac enlargement. No edema or consolidation. Calcified left midlung granuloma.   Electronically Signed   By: Lowella Grip M.D.   On: 10/05/2013 07:00    Nayan Proch A, MD  [facility] Pager:[phone]  If 7PM-7AM, please contact night-coverage [email or web address] Password [email or web address] 10/07/2013, 3:35 PM   LOS: 2 days

## 2013-10-07 NOTE — Progress Notes (Signed)
Physical Therapy Treatment Patient Details Name: Patricia [name] MRN: [id number] DOB: December 20, 1931 Today's Date: 10/07/2013 Time: [id number] PT Time Calculation (min): 36 min  PT Assessment / Plan / Recommendation  History of Present Illness 79 y.o. female admitted with nausea, vomiting and fever felt secondary to sepsis.  PMH positive for renal transplant, coronary disease, hypertension, thyroid disease.   PT Comments   Pt making progress with mobility. Had several spells of non-productive cough when working with PT.  No family present.   Follow Up Recommendations  Home health PT;Supervision/Assistance - 24 hour     Does the patient have the potential to tolerate intense rehabilitation     Barriers to Discharge        Equipment Recommendations  None recommended by PT    Recommendations for Other Services    Frequency Min 3X/week   Progress towards PT Goals Progress towards PT goals: Progressing toward goals  Plan Current plan remains appropriate    Precautions / Restrictions Precautions Precautions: Fall   Pertinent Vitals/Pain oxygen sats 97% after ambulating on 2LO2     Mobility  Bed Mobility Bed Mobility: Supine to Sit Supine to Sit: 4: Min assist;With rails;HOB elevated Sitting - Scoot to Edge of Bed: 4: Min assist Details for Bed Mobility Assistance: cues for technique  Transfers Transfers: Sit to Stand;Stand to Sit Sit to Stand: 4: Min assist;From bed;From chair/3-in-1;With upper extremity assist Stand to Sit: 4: Min guard;With upper extremity assist;To chair/3-in-1;With armrests Details for Transfer Assistance: cues for hand placement; increased time needed  Ambulation/Gait Ambulation/Gait Assistance: 4: Min assist Ambulation Distance (Feet): 16 Feet (Also ambulated another 10 feet after rest break) Assistive device: Rolling walker Gait Pattern: Step-to pattern;Decreased stride length;Trunk flexed;Shuffle (right foot tended to drag without VCs) Gait velocity:  greatly decreased General Gait Details: Pt needed frequent cueing to not let right foot drag.      Exercises General Exercises - Lower Extremity Ankle Circles/Pumps: AROM;Both;10 reps;Seated Long Arc Quad: AROM;Both;10 reps;Seated Hip Flexion/Marching: AROM;Seated (performed for 30 seconds)   PT Diagnosis:    PT Problem List:   PT Treatment Interventions:     PT Goals (current goals can now be found in the care plan section) Acute Rehab PT Goals [name]  Visit Information  Last PT Received On: 10/07/13 Assistance Needed: +1 History of Present Illness: 79 y.o. female admitted with nausea, vomiting and fever felt secondary to sepsis.  PMH positive for renal transplant, coronary disease, hypertension, thyroid disease.    Subjective Data  [name]   Cognition  Cognition Arousal/Alertness: Awake/alert Behavior During Therapy: WFL for tasks assessed/performed Overall Cognitive Status: Within Functional Limits for tasks assessed    Balance     End of Session PT - End of Session Equipment Utilized During Treatment: Gait belt;Oxygen Activity Tolerance: Patient tolerated treatment well Patient left: in chair;with call bell/phone within reach;with nursing/sitter in room (NT was in room checking vitals) Nurse Communication: Mobility status   GP     Melvern Banker 10/07/2013, 2:32 PM Lavonia Dana, [place] 10/07/2013

## 2013-10-07 NOTE — Progress Notes (Signed)
CRITICAL VALUE ALERT  Critical value received:  E coli and C-ESBL in blood cultures  Date of notification:  10/07/2013   Time of notification:  12:57 PM   Critical value read back:yes  Nurse who received alert:  Wonda Cerise RN  MD notified (1st page):  Elmahi  Time of first page:  12:57 PM   MD notified (2nd page):  Time of second page:  Responding MD:  elmahi  Time MD responded:  12:57 PM

## 2013-10-08 DIAGNOSIS — R42 Dizziness and giddiness: Secondary | ICD-10-CM

## 2013-10-08 DIAGNOSIS — A4151 Sepsis due to Escherichia coli [E. coli]: Principal | ICD-10-CM

## 2013-10-08 DIAGNOSIS — A498 Other bacterial infections of unspecified site: Secondary | ICD-10-CM

## 2013-10-08 DIAGNOSIS — Z1619 Resistance to other specified beta lactam antibiotics: Secondary | ICD-10-CM

## 2013-10-08 LAB — CULTURE, BLOOD (ROUTINE X 2)

## 2013-10-08 LAB — BASIC METABOLIC PANEL
BUN: 20 mg/dL (ref 6–23)
CO2: 21 mEq/L (ref 19–32)
Calcium: 9.2 mg/dL (ref 8.4–10.5)
Creatinine, Ser: 1.38 mg/dL — ABNORMAL HIGH (ref 0.50–1.10)
GFR calc Af Amer: 41 mL/min — ABNORMAL LOW (ref >90)
GFR calc non Af Amer: 35 mL/min — ABNORMAL LOW (ref >90)
Potassium: 4.1 mEq/L (ref 3.5–5.1)

## 2013-10-08 MED ORDER — PANTOPRAZOLE SODIUM 40 MG PO TBEC
40.0000 mg | DELAYED_RELEASE_TABLET | Freq: Every day | ORAL | Status: DC
  Administered 2013-10-08: 14:00:00 40 mg via ORAL
  Filled 2013-10-08 (×2): qty 1

## 2013-10-08 MED ORDER — WHITE PETROLATUM GEL
Status: AC
  Administered 2013-10-08: 0.2
  Filled 2013-10-08: qty 5

## 2013-10-08 MED ORDER — SODIUM CHLORIDE 0.9 % IV SOLN
INTRAVENOUS | Status: DC
  Administered 2013-10-08 – 2013-10-09 (×2): via INTRAVENOUS

## 2013-10-08 MED ORDER — PANTOPRAZOLE SODIUM 20 MG PO TBEC
20.0000 mg | DELAYED_RELEASE_TABLET | ORAL | Status: DC
  Filled 2013-10-08: qty 1

## 2013-10-08 NOTE — Progress Notes (Signed)
PATIENT DETAILS Name: Patricia [name] Age: 79 y.o. Sex: female Date of Birth: 08-Mar-1932 Admit Date: 10/05/2013 Admitting Physician Cristal Ford, DO WE:[id number] THOMAS, MD  Subjective: Denies any fever or chills, has some acid reflux.  Interval history: 79 years old with history of ESRD status post renal transplant, and taken her immunomodulators, came into the ED with nausea and vomiting. Patient also had diffuse abdominal pain. Urinalysis showed UTI, patient started on Rocephin but she was spiking fevers. Cultures came back positive for ESBL Escherichia coli in both urine and blood.  Assessment/Plan:  ESBL Escherichia coli septicemia -Blood culture grew ESBL Escherichia coli. -Patient is on Primaxin since yesterday, consider starting today is day #0, this is day #1. -Patient will need total 14 days of antibiotics, will obtain PIC/midline for IV antibiotics.  UTI -Urine culture showed ESBL Escherichia coli, this is probably why patient spiking fever on Rocephin. -Antibiotic switched to Primaxin.  Vomiting -secondary to above -resolved with supportive care -monitor and follow  History of renal transplant, left -creatinine stable-close to baseline -c/w Imuran, Cyclosporine and prednisone -also on Prophylactic Bactrim 3/week  Hx of unsteady gait/tremors -chronic issue per patient-see's [facility] as outpatient -PT eval -will defer further work up to the outpatient setting-unless clinical situation changes  Overactive bladder  -Continue Enablex and mirabegron.  Anxiety and depression  -Will continue on fluoxetine and WelChol  Hypothyroidism  -Will continue thyroxine.  Hypertension  -Controlled -Continue amlodipine, clonidine, metoprolol. Holding Lasix at this time due to sepsis.  COPD  -lungs clear -Continue Symbicort -prn Albuterol MDI  Disposition: Remain inpatient  DVT Prophylaxis: Prophylactic  Heparin   Code Status: Full code  or DNR  Family Communication None at bedside  Procedures:  None  CONSULTS:  None  Time spent 40 minutes-which includes 50% of the time with face-to-face with patient  MEDICATIONS: Scheduled Meds: . amLODipine  5 mg Oral Daily  . azaTHIOprine  50 mg Oral BID  . B-complex with vitamin C  1 tablet Oral Daily  . budesonide-formoterol  2 puff Inhalation BID  . cloNIDine  0.1 mg Oral BID  . colesevelam  1,875 mg Oral BID WC  . cycloSPORINE  100 mg Oral BID  . darifenacin  7.5 mg Oral Daily  . docusate sodium  200 mg Oral QHS  . fenofibrate  54 mg Oral Daily  . FLUoxetine  20 mg Oral Daily  . heparin  5,000 Units Subcutaneous Q8H  . imipenem-cilastatin  250 mg Intravenous Q6H  . levothyroxine  75 mcg Oral Daily  . loratadine  10 mg Oral Daily  . metoprolol succinate  100 mg Oral Daily  . mirabegron ER  25 mg Oral Daily  . multivitamin  1 tablet Oral Daily  . omega-3 acid ethyl esters  1 g Oral BID  . ondansetron  8 mg Oral Once  . pantoprazole  40 mg Oral Daily  . potassium chloride  20 mEq Oral Daily  . predniSONE  5 mg Oral Q breakfast  . sodium chloride  3 mL Intravenous Q12H  . sulfamethoxazole-trimethoprim  1 tablet Oral 3 times weekly   Continuous Infusions:  PRN Meds:.acetaminophen, acetaminophen, albuterol, alum & mag hydroxide-simeth, fluticasone, hyoscyamine, morphine injection, ondansetron  Antibiotics: Anti-infectives   Start     Dose/Rate Route Frequency Ordered Stop   10/07/13 1400  imipenem-cilastatin (PRIMAXIN) 250 mg in sodium chloride 0.9 % 100 mL IVPB     250 mg 200 mL/hr over 30 Minutes Intravenous Every 6 hours 10/07/13  1313     10/07/13 0800  cefTRIAXone (ROCEPHIN) 2 g in dextrose 5 % 50 mL IVPB  Status:  Discontinued     2 g 100 mL/hr over 30 Minutes Intravenous Every 24 hours 10/06/13 1437 10/07/13 1301   10/06/13 0800  cefTRIAXone (ROCEPHIN) 1 g in dextrose 5 % 50 mL IVPB  Status:  Discontinued     1 g 100 mL/hr over 30 Minutes Intravenous  Every 24 hours 10/05/13 0955 10/06/13 1437   10/05/13 1000  sulfamethoxazole-trimethoprim (BACTRIM,SEPTRA) 400-80 MG per tablet 1 tablet     1 tablet Oral Once per day on Mon Wed Fri 10/05/13 0952     10/05/13 0730  cefTRIAXone (ROCEPHIN) 1 g in dextrose 5 % 50 mL IVPB     1 g 100 mL/hr over 30 Minutes Intravenous  Once 10/05/13 0722 10/05/13 0831       PHYSICAL EXAM: Vital signs in last 24 hours: Filed Vitals:   10/07/13 2222 10/08/13 0558 10/08/13 0924 10/08/13 1023  BP: 130/60 123/65  135/58  Pulse:  62  62  Temp:  99.2 F (37.3 C)    TempSrc:  Oral    Resp:  20    Height:      Weight:  83.2 kg (183 lb 6.8 oz)    SpO2:  96% 96%     Weight change: 0.6 kg (1 lb 5.2 oz) Filed Weights   10/06/13 0500 10/07/13 0458 10/08/13 0558  Weight: 82.555 kg (182 lb) 82.6 kg (182 lb 1.6 oz) 83.2 kg (183 lb 6.8 oz)   Body mass index is 31.47 kg/(m^2).   Gen Exam: Awake and alert with clear speech.Slow but answers all questions appropriately   Neck: Supple, No JVD.   Chest: B/L Clear.   CVS: S1 S2 Regular, no murmurs.  Abdomen: soft, BS +, non tender, non distended.  Extremities: no edema, lower extremities warm to touch. Neurologic: Non Focal-but with gen weakness. +Tremors Skin: No Rash.   Wounds: N/A.   Intake/Output from previous day:  Intake/Output Summary (Last 24 hours) at 10/08/13 1205 Last data filed at 10/08/13 1026  Gross per 24 hour  Intake    443 ml  Output      0 ml  Net    443 ml     LAB RESULTS: CBC  Recent Labs Lab 10/05/13 0540 10/05/13 1146 10/06/13 0540  WBC 11.9* 12.7* 12.9*  HGB 12.4 12.9 11.5*  HCT 37.3 38.6 35.4*  PLT 167 165 131*  MCV 94.2 94.8 96.2  MCH 31.3 31.7 31.3  MCHC 33.2 33.4 32.5  RDW 14.4 14.7 15.2    Chemistries   Recent Labs Lab 10/05/13 0540 10/05/13 1146 10/06/13 0540 10/08/13 0415  NA 141  --  144 140  K 3.3*  --  3.9 4.1  CL 106  --  111 109  CO2 22  --  20 21  GLUCOSE 150*  --  129* 135*  BUN 20  --  16 20   CREATININE 1.22* 1.14* 1.41* 1.38*  CALCIUM 9.7  --  9.2 9.2    CBG: No results found for this basename: GLUCAP,  in the last 168 hours  GFR Estimated Creatinine Clearance: 34.5 ml/min (by C-G formula based on Cr of 1.38).  Coagulation profile No results found for this basename: INR, PROTIME,  in the last 168 hours  Cardiac Enzymes No results found for this basename: CK, CKMB, TROPONINI, MYOGLOBIN,  in the last 168 hours  No components found  with this basename: POCBNP,  No results found for this basename: DDIMER,  in the last 72 hours No results found for this basename: HGBA1C,  in the last 72 hours No results found for this basename: CHOL, HDL, LDLCALC, TRIG, CHOLHDL, LDLDIRECT,  in the last 72 hours No results found for this basename: TSH, T4TOTAL, FREET3, T3FREE, THYROIDAB,  in the last 72 hours No results found for this basename: VITAMINB12, FOLATE, FERRITIN, TIBC, IRON, RETICCTPCT,  in the last 72 hours No results found for this basename: LIPASE, AMYLASE,  in the last 72 hours  Urine Studies No results found for this basename: UACOL, UAPR, USPG, UPH, UTP, UGL, UKET, UBIL, UHGB, UNIT, UROB, ULEU, UEPI, UWBC, URBC, UBAC, CAST, CRYS, UCOM, BILUA,  in the last 72 hours  MICROBIOLOGY: Recent Results (from the past 240 hour(s))  CULTURE, BLOOD (ROUTINE X 2)     Status: None   Collection Time    10/05/13  5:40 AM      Result Value Range Status   Specimen Description BLOOD RIGHT ANTECUBITAL   Final   Special Requests BOTTLES DRAWN AEROBIC AND ANAEROBIC 5CC   Final   Culture  Setup Time     Final   Value: 10/05/2013 14:11     Performed at [facility]   Culture     Final   Value:        BLOOD CULTURE RECEIVED NO GROWTH TO DATE CULTURE WILL BE HELD FOR 5 DAYS BEFORE ISSUING A FINAL NEGATIVE REPORT     Performed at [facility]   Report Status PENDING   Incomplete  CULTURE, BLOOD (ROUTINE X 2)     Status: None   Collection Time    10/05/13  6:15 AM      Result  Value Range Status   Specimen Description BLOOD HAND RIGHT   Final   Special Requests BOTTLES DRAWN AEROBIC AND ANAEROBIC 5CC   Final   Culture  Setup Time     Final   Value: 10/05/2013 14:12     Performed at [facility]   Culture     Final   Value:        BLOOD CULTURE RECEIVED NO GROWTH TO DATE CULTURE WILL BE HELD FOR 5 DAYS BEFORE ISSUING A FINAL NEGATIVE REPORT     Performed at [facility]   Report Status PENDING   Incomplete  URINE CULTURE     Status: None   Collection Time    10/05/13  6:21 AM      Result Value Range Status   Specimen Description URINE, RANDOM   Final   Special Requests NONE   Final   Culture  Setup Time     Final   Value: 10/05/2013 07:09     Performed at [place]     Final   Value: >=100,000 COLONIES/ML     Performed at [facility]   Culture     Final   Value: ESCHERICHIA COLI     Note: Confirmed Extended Spectrum Beta-Lactamase Producer (ESBL) CRITICAL RESULT CALLED TO, READ BACK BY AND VERIFIED WITH: Gavin Potters [facility] 10/07/13 AT 100 PM BY [facility]     Performed at [facility]   Report Status 10/07/2013 FINAL   Final   Organism ID, Bacteria ESCHERICHIA COLI   Final  CULTURE, BLOOD (ROUTINE X 2)     Status: None   Collection Time    10/05/13  3:37 PM  Result Value Range Status   Specimen Description BLOOD RIGHT HAND   Final   Special Requests     Final   Value: BOTTLES DRAWN AEROBIC AND ANAEROBIC 10CC AER,6CC ANA   Culture  Setup Time     Final   Value: 10/05/2013 22:08     Performed at [facility]   Culture     Final   Value: ESCHERICHIA COLI     Note: Confirmed Extended Spectrum Beta-Lactamase Producer (ESBL) CRITICAL RESULT CALLED TO, READ BACK BY AND VERIFIED WITH: SHINITA LEE @ 0740 10/08/13 BY KRAWS     Note: Gram Stain Report Called to,Read Back By and Verified With: Wonda Cerise @ 1349 10/06/13 BY KRAWS     Performed at [facility]   Report Status 10/08/2013  FINAL   Final   Organism ID, Bacteria ESCHERICHIA COLI   Final  CULTURE, BLOOD (ROUTINE X 2)     Status: None   Collection Time    10/05/13  3:42 PM      Result Value Range Status   Specimen Description BLOOD LEFT HAND   Final   Special Requests BOTTLES DRAWN AEROBIC ONLY 3CC   Final   Culture  Setup Time     Final   Value: 10/05/2013 22:00     Performed at [facility]   Culture     Final   Value:        BLOOD CULTURE RECEIVED NO GROWTH TO DATE CULTURE WILL BE HELD FOR 5 DAYS BEFORE ISSUING A FINAL NEGATIVE REPORT     Performed at [facility]   Report Status PENDING   Incomplete    RADIOLOGY STUDIES/RESULTS: Dg Chest 2 View  10/05/2013   CLINICAL DATA:  Cough and chest pain  EXAM: CHEST  2 VIEW  COMPARISON:  Chest radiograph and chest CT April 23, 2012  FINDINGS: There is a calcified granuloma in the left mid lung, stable. Mild elevation of the right hemidiaphragm is present. There is no edema or consolidation. Heart is borderline enlarged with normal pulmonary vascularity. No adenopathy. There is atherosclerotic change in the aorta. There is degenerative change in the thoracic spine.  IMPRESSION: Mild cardiac enlargement. No edema or consolidation. Calcified left midlung granuloma.   Electronically Signed   By: Lowella Grip M.D.   On: 10/05/2013 07:00    Noam Karaffa A, MD  [facility] Pager:[phone]  If 7PM-7AM, please contact night-coverage [email or web address] Password [email or web address] 10/08/2013, 12:05 PM   LOS: 3 days

## 2013-10-09 ENCOUNTER — Inpatient Hospital Stay (HOSPITAL_COMMUNITY): Payer: Medicare Other

## 2013-10-09 DIAGNOSIS — K5732 Diverticulitis of large intestine without perforation or abscess without bleeding: Secondary | ICD-10-CM

## 2013-10-09 LAB — CBC
Hemoglobin: 10.6 g/dL — ABNORMAL LOW (ref 12.0–15.0)
MCV: 96.4 fL (ref 78.0–100.0)
Platelets: 157 10*3/uL (ref 150–400)
RDW: 15 % (ref 11.5–15.5)
WBC: 5.5 10*3/uL (ref 4.0–10.5)

## 2013-10-09 LAB — BASIC METABOLIC PANEL
CO2: 19 mEq/L (ref 19–32)
Calcium: 9.2 mg/dL (ref 8.4–10.5)
Creatinine, Ser: 1.12 mg/dL — ABNORMAL HIGH (ref 0.50–1.10)
GFR calc Af Amer: 53 mL/min — ABNORMAL LOW (ref >90)
GFR calc non Af Amer: 45 mL/min — ABNORMAL LOW (ref >90)

## 2013-10-09 MED ORDER — SODIUM CHLORIDE 0.9 % IV SOLN
1.0000 g | INTRAVENOUS | Status: DC
  Administered 2013-10-09: 1 g via INTRAVENOUS
  Filled 2013-10-09: qty 1

## 2013-10-09 MED ORDER — SULFAMETHOXAZOLE-TRIMETHOPRIM 400-80 MG PO TABS: 1.0000 | ORAL_TABLET | ORAL | Status: DC

## 2013-10-09 MED ORDER — SODIUM CHLORIDE 0.9 % IV SOLN: 1.0000 g | INTRAVENOUS | Status: AC

## 2013-10-09 MED ORDER — PANTOPRAZOLE SODIUM 40 MG PO TBEC: 40.0000 mg | DELAYED_RELEASE_TABLET | Freq: Every day | ORAL | Status: DC

## 2013-10-09 NOTE — Progress Notes (Signed)
ANTIBIOTIC CONSULT NOTE - INITIAL  Pharmacy Consult for Invanz Indication: ESBL + Ecoli UTI  Allergies  Allergen Reactions  . Aricept [Donepezil Hcl] Nausea And Vomiting  . Codeine Nausea And Vomiting  . Penicillins Other (See Comments)    Doesn't remember  . Statins Other (See Comments)    Doesn't remember  . Tetracyclines & Related Rash    Patient Measurements: Height: 5\' 4"  (162.6 cm) Weight: 182 lb 15.7 oz (83 kg) IBW/kg (Calculated) : 54.7 Adjusted Body Weight:    Vital Signs: Temp: 99 F (37.2 C) (12/26 0526) Temp src: Oral (12/26 0526) BP: 153/77 mmHg (12/26 0526) Pulse Rate: 61 (12/26 0526) Intake/Output from previous day: 12/25 0701 - 12/26 0700 In: 1386.8 [P.O.:300; I.V.:886.8; IV Piggyback:200] Out: 150 [Urine:150] Intake/Output from this shift: Total I/O In: -  Out: 200 [Urine:200]  Labs:  Recent Labs  10/08/13 0415 10/09/13 0550  WBC  --  5.5  HGB  --  10.6*  PLT  --  157  CREATININE 1.38* 1.12*   Estimated Creatinine Clearance: 42.4 ml/min (by C-G formula based on Cr of 1.12). No results found for this basename: VANCOTROUGH, VANCOPEAK, VANCORANDOM, [place], [place], [place], [place], [place], TOBRARND, AMIKACINPEAK, AMIKACINTROU, AMIKACIN,  in the last 72 hours   Microbiology: Recent Results (from the past 720 hour(s))  CULTURE, BLOOD (ROUTINE X 2)     Status: None   Collection Time    10/05/13  5:40 AM      Result Value Range Status   Specimen Description BLOOD RIGHT ANTECUBITAL   Final   Special Requests BOTTLES DRAWN AEROBIC AND ANAEROBIC 5CC   Final   Culture  Setup Time     Final   Value: 10/05/2013 14:11     Performed at [facility]   Culture     Final   Value:        BLOOD CULTURE RECEIVED NO GROWTH TO DATE CULTURE WILL BE HELD FOR 5 DAYS BEFORE ISSUING A FINAL NEGATIVE REPORT     Performed at [facility]   Report Status PENDING   Incomplete  CULTURE, BLOOD (ROUTINE X 2)     Status: None    Collection Time    10/05/13  6:15 AM      Result Value Range Status   Specimen Description BLOOD HAND RIGHT   Final   Special Requests BOTTLES DRAWN AEROBIC AND ANAEROBIC 5CC   Final   Culture  Setup Time     Final   Value: 10/05/2013 14:12     Performed at [facility]   Culture     Final   Value:        BLOOD CULTURE RECEIVED NO GROWTH TO DATE CULTURE WILL BE HELD FOR 5 DAYS BEFORE ISSUING A FINAL NEGATIVE REPORT     Performed at [facility]   Report Status PENDING   Incomplete  URINE CULTURE     Status: None   Collection Time    10/05/13  6:21 AM      Result Value Range Status   Specimen Description URINE, RANDOM   Final   Special Requests NONE   Final   Culture  Setup Time     Final   Value: 10/05/2013 07:09     Performed at [place]     Final   Value: >=100,000 COLONIES/ML     Performed at [facility]   Culture     Final   Value:  ESCHERICHIA COLI     Note: Confirmed Extended Spectrum Beta-Lactamase Producer (ESBL) CRITICAL RESULT CALLED TO, READ BACK BY AND VERIFIED WITH: Gavin Potters [facility] 10/07/13 AT 100 PM BY [facility]     Performed at [facility]   Report Status 10/07/2013 FINAL   Final   Organism ID, Bacteria ESCHERICHIA COLI   Final  CULTURE, BLOOD (ROUTINE X 2)     Status: None   Collection Time    10/05/13  3:37 PM      Result Value Range Status   Specimen Description BLOOD RIGHT HAND   Final   Special Requests     Final   Value: BOTTLES DRAWN AEROBIC AND ANAEROBIC 10CC AER,6CC ANA   Culture  Setup Time     Final   Value: 10/05/2013 22:08     Performed at [facility]   Culture     Final   Value: ESCHERICHIA COLI     Note: Confirmed Extended Spectrum Beta-Lactamase Producer (ESBL) CRITICAL RESULT CALLED TO, READ BACK BY AND VERIFIED WITH: SHINITA LEE @ 0740 10/08/13 BY KRAWS     Note: Gram Stain Report Called to,Read Back By and Verified With: Wonda Cerise @ 1349 10/06/13 BY KRAWS     Performed at  [facility]   Report Status 10/08/2013 FINAL   Final   Organism ID, Bacteria ESCHERICHIA COLI   Final  CULTURE, BLOOD (ROUTINE X 2)     Status: None   Collection Time    10/05/13  3:42 PM      Result Value Range Status   Specimen Description BLOOD LEFT HAND   Final   Special Requests BOTTLES DRAWN AEROBIC ONLY 3CC   Final   Culture  Setup Time     Final   Value: 10/05/2013 22:00     Performed at [facility]   Culture     Final   Value:        BLOOD CULTURE RECEIVED NO GROWTH TO DATE CULTURE WILL BE HELD FOR 5 DAYS BEFORE ISSUING A FINAL NEGATIVE REPORT     Performed at [facility]   Report Status PENDING   Incomplete    Medical History: Past Medical History  Diagnosis Date  . Renal disorder   . Hx of kidney transplant     right  . Coronary artery disease   . Hypertension   . Thyroid disease   . Multiple allergies   . Abnormal gait   . Reflux   . Diverticulitis   . Hypothyroidism   . Depression   . Anxiety   . COPD, mild   . Dizziness   . Diabetes     Assessment: CC: fever, vomiting  52 yoF presents to the ED with nausea and vomiting, abd pain with hx ESRD s/p renal transplant. Cultures positive for ESBL E.Coli in both urine and blood.  PMH: renal transplant, CAD, HTN, hypothyroid, gerd, depression, anxiety, COPD, DM, diverticulitis  AC: None PTA, heparin SQ for VTE proph. Hgb down to 10.6.  ID: Primaxin D#3 to change to Invanz for discharge for ESBL Ecoli UTI/bacteremia - Tmax 99, WBC 5.5, Scr 1.12 down - 3x weekly bactrim as PTA  Invanz 12/26>> Primaxin 12/24>>12/26 CTX 12/22> 12/24  12/22 Urine - >100K ESBL Ecoli 12/22 Blood - 1/3 Ecoli  CV: Hx CAD, HTN - BP 153/77, HR 61 - amlodipine5, clonidine, Welchol fenofibrate, toprol, lovaza, (lasix on hold)  Endo: Hx DM, hypothyroid - AM gluc ok, synthroid, daily prednisone  GI/Nutr: Hx GERD, diverticulitis - renal diet, no LFTs, po PPI, VitB+C  Neuro: Hx depression, anxiety -  Prozac  Renal: Hx renal transplant - Scr 1.12, lytes ok - cyclosporine, azathioprine,prednisone, enablex, Myrbetriq  Pulm: Hx COPD - 96% 2L [place] - symbicort, claritin  Heme/Onc: H/H down  Best practices: heparin SQ   Plan:  D/c Primaxin Invanz 1g IV q24h.   Telma Pyeatt S. Alford Highland, PharmD, [place] Clinical Staff Pharmacist Pager [phone]  [place], [place] 10/09/2013,10:12 AM

## 2013-10-09 NOTE — Progress Notes (Signed)
After speaking with IV team, I spoke with Dr. Justin Mend of [facility].  He is comfortable having a midline placed for 13 more days of IV antibiotics.   Patricia [name], [place] [facility] Pager: [phone] October 09, 2014   10:00 am

## 2013-10-09 NOTE — Progress Notes (Signed)
Physical Therapy Treatment Patient Details Name: Patricia [name] MRN: [id number] DOB: [birth year] Today's Date: 10/09/2013 Time: [id number] PT Time Calculation (min): 34 min  PT Assessment / Plan / Recommendation  History of Present Illness 79 y.o. female admitted with nausea, vomiting and fever felt secondary to sepsis.  PMH positive for renal transplant, coronary disease, hypertension, thyroid disease.   PT Comments   Pt required increased (A) today with mobility. Performed SPT with mod (A) and has difficulty achieving upright standing position. Pt and husband both agreeable that amount of (A) needed at this time is not safe to D/C home. Pt and husband agreeable to SNF at this time. Will cont to follow per POC.   Follow Up Recommendations  SNF;Supervision/Assistance - 24 hour     Does the patient have the potential to tolerate intense rehabilitation     Barriers to Discharge        Equipment Recommendations  None recommended by PT    Recommendations for Other Services    Frequency Min 2X/week   Progress towards PT Goals Progress towards PT goals: Progressing toward goals  Plan Discharge plan needs to be updated;Frequency needs to be updated    Precautions / Restrictions Precautions Precautions: Fall Restrictions Weight Bearing Restrictions: No   Pertinent Vitals/Pain C/o nausea    Mobility  Bed Mobility Bed Mobility: Supine to Sit;Sitting - Scoot to [employer] of Bed;Sit to Supine;Scooting to [facility] Supine to Sit: 3: Mod assist;With rails;HOB flat Sitting - Scoot to Edge of Bed: 4: Min assist;With rail Sit to Supine: HOB flat;3: Mod assist Scooting to HOB: 2: Max assist Details for Bed Mobility Assistance: pt required increased (A) today due to generalized weakness;  requires (A) to bring LEs off bed and onto bed. cues for technique   Transfers Transfers: Sit to Stand;Stand to Sit;Stand Pivot Transfers Sit to Stand: 3: Mod assist;From bed;With upper extremity assist;From elevated  surface Stand to Sit: 3: Mod assist;To chair/3-in-1;With armrests;With upper extremity assist;To bed Stand Pivot Transfers: 3: Mod assist;From elevated surface;With armrests Details for Transfer Assistance: pt had increased difficulty achieving upright standing position; requires (A) to shift weight anteriorly and maintain balance with transfers; requires cues for hand placement and technique. pt very unsteady with transfers today and with shaking LEs due to weakness   Ambulation/Gait Ambulation/Gait Assistance: 3: Mod assist Ambulation Distance (Feet): 4 Feet Assistive device: Rolling walker Ambulation/Gait Assistance Details: pt requried (A) to maintain balance and manage RW; pt with fwd flexed posture and short shuffled steps back to bed; pt very unsteady Gait Pattern: Step-to pattern;Decreased stride length;Trunk flexed;Shuffle Gait velocity: greatly decreased Stairs: No Wheelchair Mobility Wheelchair Mobility: No         PT Diagnosis:    PT Problem List:   PT Treatment Interventions:     PT Goals (current goals can now be found in the care plan section) Acute Rehab PT Goals [name] PT Goal Formulation: With patient/family Time For Goal Achievement: 10/20/13 Potential to Achieve Goals: Good  Visit Information  Last PT Received On: 10/09/13 Assistance Needed: +1 History of Present Illness: 79 y.o. female admitted with nausea, vomiting and fever felt secondary to sepsis.  PMH positive for renal transplant, coronary disease, hypertension, thyroid disease.    Subjective Data  Subjective: pt lying supine with husband present; agreeable to therapy. stated "ive been so sick on my stomach but we can do something"  [name]   Cognition  Cognition Arousal/Alertness: Awake/alert Behavior During Therapy: WFL for tasks assessed/performed Overall Cognitive Status: Within Functional Limits for tasks assessed     Balance  Balance Balance Assessed: Yes Static Sitting Balance Static Sitting - Balance Support: Feet unsupported;Bilateral upper extremity supported Static Sitting - Level of Assistance: 5: Stand by assistance Static Sitting - Comment/# of Minutes: tolerated sitting EOB ~ 8 min Static Standing Balance Static Standing - Balance Support: Bilateral upper extremity supported;During functional activity Static Standing - Level of Assistance: 3: Mod assist Static Standing - Comment/# of Minutes: pt tolerated standing while tech performed perineal hygiene; tolerated ~ 2 min   End of Session PT - End of Session Equipment Utilized During Treatment: Gait belt Activity Tolerance: Patient limited by fatigue Patient left: in bed;with call bell/phone within reach;with nursing/sitter in room;with family/visitor present Nurse Communication: Mobility status;Precautions;Other (comment) (D/C disposition updated)   [place], [place], [place] 10/09/2013, 1:17 PM

## 2013-10-09 NOTE — Clinical Social Work Placement (Signed)
Clinical Social Work Department CLINICAL SOCIAL WORK PLACEMENT NOTE 10/09/2013  Patient:  Patricia [name], Patricia [name]  Account Number:  [id number] Admit date:  10/05/2013  Clinical Social Worker:  Kemper Durie, [place]  Date/time:  10/09/2013 03:06 PM  Clinical Social Work is seeking post-discharge placement for this patient at the following level of care:   SKILLED NURSING   (*CSW will update this form in Epic as items are completed)   10/09/2013  Patient/family provided with [place] Department of Clinical Social Work's list of facilities offering this level of care within the geographic area requested by the patient (or if unable, by the patient's family).  10/09/2013  Patient/family informed of their freedom to choose among providers that offer the needed level of care, that participate in Medicare, Medicaid or managed care program needed by the patient, have an available bed and are willing to accept the patient.  10/09/2013  Patient/family informed of [facility]' ownership interest in [facility], as well as of the fact that they are under no obligation to receive care at this facility.  PASARR submitted to EDS on 10/09/2013 PASARR number received from EDS on 10/09/2013  FL2 transmitted to all facilities in geographic area requested by pt/family on  10/09/2013 FL2 transmitted to all facilities within larger geographic area on   Patient informed that his/her managed care company has contracts with or will negotiate with  certain facilities, including the following:     Patient/family informed of bed offers received:  10/09/2013 Patient chooses bed at [place] Physician recommends and patient chooses bed at    Patient to be transferred to [place] on  10/09/2013 Patient to be transferred to facility by Ambulance  The following physician request were entered in Epic:   Additional Comments: Per MD patient ready  to DC to [facility] on 10/09/13. RN, patient, husband, and facility notified of DC. RN given number for report. Ambulance transport requested for 3:30PM for patient. DC packet left on chart. Husband completing paperwork at facility. CSW signing off.   Liz Beach, [place], [place], JI:[id number]

## 2013-10-09 NOTE — Discharge Summary (Signed)
Addendum  Patient seen and examined, chart and data base reviewed.  I agree with the above assessment and plan.  For full details please see Mrs. Imogene Burn PA note.  Immunocompromised patient secondary to renal transplant.  ESBL Escherichia coli septicemia/UTI, Invanz for 12 more days. Patient is going to SNF.   Birdie Hopes, MD [facility] Pager: [phone] 10/09/2013, 1:45 PM

## 2013-10-09 NOTE — Progress Notes (Signed)
Peripherally Inserted Central Catheter/Midline Placement  The IV Nurse has discussed with the patient and/or persons authorized to consent for the patient, the purpose of this procedure and the potential benefits and risks involved with this procedure.  The benefits include less needle sticks, lab draws from the catheter and patient may be discharged home with the catheter.  Risks include, but not limited to, infection, bleeding, blood clot (thrombus formation), and puncture of an artery; nerve damage and irregular heat beat.  Alternatives to this procedure were also discussed.  Consent obtained by Jacobo Forest, RN.  PICC/Midline Placement Documentation        Tida Saner, Nicolette Bang 10/09/2013, 11:53 AM

## 2013-10-09 NOTE — Progress Notes (Signed)
Attempted x3 to insert Midline Catheter in left upper arm but unable to advance guidewire.  Veins in right upper arm were too small, measuring 50% of vein occupied by catheter.  Notified Diplomatic Services operational officer.  Patient tolerated procedure well

## 2013-10-09 NOTE — Discharge Summary (Signed)
Physician Discharge Summary  Patricia [name] [id number] DOB: 1932-10-12 DOA: 10/05/2013  PCP: [name], MD  Admit date: 10/05/2013 Discharge date: 10/09/2013  Time spent: 50 minutes  Recommendations for Outpatient Follow-up:   1. Patient will be on [facility] IV for 12 additional days for ESBL Ecoli Bacteremia.  Hold Bactrim while on INVANZ.   2. Patient will be discharged to SNF for PT / OT rehab before returning home with her husband. 3. 3 - days CBC / BMET.  Patient with renal transplant who has had a drop in hgb recently. 4. Appointment scheduled with Dr. Cristina Gong  10/29/2013 at 10:45.  (Anemia with recent Black Stool)   Discharge Diagnoses:  Principal Problem:   Sepsis Active Problems:   History of renal transplant   Hx of kidney transplant   Hypertension   Thyroid disease   Hypothyroidism   Anxiety   COPD, mild   Diabetes   Urinary tract infection   Infection due to ESBL-producing Escherichia coli   Septicemia due to E. coli   Discharge Condition: stable.   Diet recommendation: Renal Diet  Filed Weights   10/07/13 0458 10/08/13 0558 10/09/13 0700  Weight: 82.6 kg (182 lb 1.6 oz) 83.2 kg (183 lb 6.8 oz) 83 kg (182 lb 15.7 oz)    History of present illness:  79 year old female with a history of ESRD, status post renal transplant, on immunomodulators, came into the ED with nausea and vomiting. Patient also had diffuse abdominal pain. Urinalysis showed UTI, patient started on Rocephin but she was spiking fevers. Cultures came back positive for ESBL Escherichia coli in both urine and blood.   Hospital Course:   ESBL Escherichia coli septicemia  -1 of 4 Blood cultures grew ESBL Escherichia coli.  -Patient was originally on broad spectrum antibiotics.  She was placed on Primaxin 12/25.  At the time of discharge she was changed to [facility] for once a day dosing. -Patient will need total 14 days of antibiotics, Midline IV access will be placed before  discharge.  UTI  -U/A appeared infected with many bacteria and 21 - 50 WBCs. -Started on Rocephin, but continued to be febrile. -antibiotics were adjusted on 12/25  Anemia -Patient's baseline Hgb appears to be 12 - 13.   -Hgb decreased to 10.6 during this admission, likely due to IV fluids.  MCV is 96.4 -Patient reports tarry stool on 12/24.  Will request that she follow up outpatient with her GI physician, Dr. Cristina Gong. -CBC requested on Monday 12/29.  Vomiting  -secondary to bacteremia and UTI -resolved with supportive care   History of renal transplant, left  -creatinine stable-close to baseline  -c/w Imuran, Cyclosporine and prednisone  -also on Prophylactic Bactrim 3/week.  Will hold this while the patient is on [facility] and resume after [facility] is complete.  Hx of unsteady gait/tremors  -chronic issue per patient-sees [facility] as outpatient  -PT eval was completed.  Patient will be discharged to SNF for rehabilitation.  Overactive bladder  -Continue Enablex and mirabegron.   Anxiety and depression  -Will continue on fluoxetine and WelChol   Hypothyroidism  -Will continue thyroxine.   Hypertension  -Controlled  -Continue amlodipine, clonidine, metoprolol. Resume lasix on day of discharge.  COPD  -lungs clear  -Continue Symbicort  -prn Albuterol MDI   Procedures:  Midline IV placement on 12/26.   Discharge Exam: Filed Vitals:   10/09/13 0526  BP: 153/77  Pulse: 61  Temp: 99 F (37.2 C)  Resp: 22  Gen Exam: Awake and alert with clear speech. Laughs and tells me her shoulder hurts.  Husband at bedside. Neck: Supple, No JVD.  Chest: B/L Clear. No W/C/R CVS: S1 S2 Regular, no murmurs.  Abdomen: soft, BS +, non tender, non distended.  Extremities: no edema, lower extremities warm to touch.  Neurologic: Non Focal-but with gen weakness. +Tremors     Discharge Instructions      Discharge Orders   Future Appointments Provider Department Dept  Phone   02/11/2014 11:30 AM Marcial Pacas, MD [facility] [phone]   Future Orders Complete By Expires   Diet - low sodium heart healthy  As directed    Diet - low sodium heart healthy  As directed    Comments:     RENAL DIET   Increase activity slowly  As directed    Increase activity slowly  As directed        Medication List         albuterol 108 (90 BASE) MCG/ACT inhaler  Commonly known as:  PROVENTIL HFA;VENTOLIN HFA  Inhale 2 puffs into the lungs every 6 (six) hours as needed for wheezing or shortness of breath. Patient uses this medication prn.     amLODipine 5 MG tablet  Commonly known as:  NORVASC  Take 5 mg by mouth daily.     azaTHIOprine 50 MG tablet  Commonly known as:  IMURAN  Take 50 mg by mouth 2 (two) times daily.     B-complex with vitamin C tablet  Take 1 tablet by mouth daily.     budesonide-formoterol 160-4.5 MCG/ACT inhaler  Commonly known as:  SYMBICORT  Inhale 2 puffs into the lungs 2 (two) times daily.     cetirizine 10 MG tablet  Commonly known as:  ZYRTEC  Take 10 mg by mouth daily.     CITRACAL CALCIUM+D PO  Take 1 tablet by mouth 2 (two) times daily.     cloNIDine 0.1 MG tablet  Commonly known as:  CATAPRES  Take 0.1 mg by mouth 2 (two) times daily.     Co Q 10 100 MG Caps  Take 1 tablet by mouth daily.     colesevelam 625 MG tablet  Commonly known as:  WELCHOL  Take 1,875 mg by mouth 2 (two) times daily with a meal.     cycloSPORINE 100 MG capsule  Commonly known as:  SANDIMMUNE  Take 100 mg by mouth 2 (two) times daily.     ENABLEX 7.5 MG 24 hr tablet  Generic drug:  darifenacin  Take 7.5 mg by mouth daily.     fenofibrate 48 MG tablet  Commonly known as:  TRICOR  Take 48 mg by mouth daily.     FLUoxetine 20 MG tablet  Commonly known as:  PROZAC  Take 20 mg by mouth daily.     fluticasone 50 MCG/ACT nasal spray  Commonly known as:  FLONASE  Place 1 spray into both nostrils daily as needed for  allergies or rhinitis.     furosemide 40 MG tablet  Commonly known as:  LASIX  Take 40 mg by mouth 2 (two) times daily.     Glucosamine HCl-MSM 750-750 MG Tabs  Take 1,500 tablets by mouth daily.     hyoscyamine 0.125 MG Tbdp disintergrating tablet  Commonly known as:  ANASPAZ  Place 0.125 mg under the tongue every 6 (six) hours as needed for bladder spasms. Patient uses this medication prn.     L-Lysine 500  MG Tabs  Take 500 mg by mouth daily.     levothyroxine 75 MCG tablet  Commonly known as:  SYNTHROID, LEVOTHROID  Take 75 mcg by mouth daily.     metoprolol succinate 100 MG 24 hr tablet  Commonly known as:  TOPROL-XL  Take 100 mg by mouth daily. Take with or immediately following a meal.     multivitamin tablet  Take 1 tablet by mouth daily.     MYRBETRIQ 50 MG Tb24 tablet  Generic drug:  mirabegron ER  Take 25 mg by mouth daily.     omega-3 acid ethyl esters 1 G capsule  Commonly known as:  LOVAZA  Take 1 g by mouth 2 (two) times daily.     ondansetron 4 MG disintegrating tablet  Commonly known as:  ZOFRAN ODT  Take 1 tablet (4 mg total) by mouth every 8 (eight) hours as needed for nausea or vomiting.     pantoprazole 40 MG tablet  Commonly known as:  PROTONIX  Take 1 tablet (40 mg total) by mouth daily.     POTASSIUM CHLORIDE PO  Take 300 mcg by mouth daily.     predniSONE 5 MG tablet  Commonly known as:  DELTASONE  Take 5 mg by mouth daily with breakfast.     sodium chloride 0.9 % SOLN 50 mL with ertapenem 1 G SOLR 1 g  Inject 1 g into the vein daily.  Start taking on:  10/10/2013     sulfamethoxazole-trimethoprim 400-80 MG per tablet  Commonly known as:  BACTRIM,SEPTRA  Take 1 tablet by mouth 3 (three) times a week. Patient uses this medication on Mon, Wed., and Friday.  Stop while on INVANZ (IV antibiotic).  Restart after [facility] is finished.       Allergies  Allergen Reactions  . Aricept [Donepezil Hcl] Nausea And Vomiting  . Codeine Nausea And  Vomiting  . Penicillins Other (See Comments)    Doesn't remember  . Statins Other (See Comments)    Doesn't remember  . Tetracyclines & Related Rash   Follow-up Information   Follow up with Cleotis Nipper, MD On 10/29/2013. (Arrive at 10:45)    Specialty:  Gastroenterology   Contact information:   [id number], [place] [address] [phone]                       The results of significant diagnostics from this hospitalization (including imaging, microbiology, ancillary and laboratory) are listed below for reference.    Significant Diagnostic Studies: Dg Chest 2 View  10/05/2013   CLINICAL DATA:  Cough and chest pain  EXAM: CHEST  2 VIEW  COMPARISON:  Chest radiograph and chest CT April 23, 2012  FINDINGS: There is a calcified granuloma in the left mid lung, stable. Mild elevation of the right hemidiaphragm is present. There is no edema or consolidation. Heart is borderline enlarged with normal pulmonary vascularity. No adenopathy. There is atherosclerotic change in the aorta. There is degenerative change in the thoracic spine.  IMPRESSION: Mild cardiac enlargement. No edema or consolidation. Calcified left midlung granuloma.   Electronically Signed   By: Lowella Grip M.D.   On: 10/05/2013 07:00    Microbiology: Recent Results (from the past 240 hour(s))  CULTURE, BLOOD (ROUTINE X 2)     Status: None   Collection Time    10/05/13  5:40 AM      Result Value Range Status   Specimen Description BLOOD  RIGHT ANTECUBITAL   Final   Special Requests BOTTLES DRAWN AEROBIC AND ANAEROBIC 5CC   Final   Culture  Setup Time     Final   Value: 10/05/2013 14:11     Performed at [facility]   Culture     Final   Value:        BLOOD CULTURE RECEIVED NO GROWTH TO DATE CULTURE WILL BE HELD FOR 5 DAYS BEFORE ISSUING A FINAL NEGATIVE REPORT     Performed at [facility]   Report Status PENDING   Incomplete  CULTURE, BLOOD (ROUTINE X 2)     Status: None    Collection Time    10/05/13  6:15 AM      Result Value Range Status   Specimen Description BLOOD HAND RIGHT   Final   Special Requests BOTTLES DRAWN AEROBIC AND ANAEROBIC 5CC   Final   Culture  Setup Time     Final   Value: 10/05/2013 14:12     Performed at [facility]   Culture     Final   Value:        BLOOD CULTURE RECEIVED NO GROWTH TO DATE CULTURE WILL BE HELD FOR 5 DAYS BEFORE ISSUING A FINAL NEGATIVE REPORT     Performed at [facility]   Report Status PENDING   Incomplete  URINE CULTURE     Status: None   Collection Time    10/05/13  6:21 AM      Result Value Range Status   Specimen Description URINE, RANDOM   Final   Special Requests NONE   Final   Culture  Setup Time     Final   Value: 10/05/2013 07:09     Performed at [facility]     Final   Value: >=100,000 COLONIES/ML     Performed at [facility]   Culture     Final   Value: ESCHERICHIA COLI     Note: Confirmed Extended Spectrum Beta-Lactamase Producer (ESBL) CRITICAL RESULT CALLED TO, READ BACK BY AND VERIFIED WITH: [facility] 10/07/13 AT 100 PM BY [facility]     Performed at [facility]   Report Status 10/07/2013 FINAL   Final   Organism ID, Bacteria ESCHERICHIA COLI   Final  CULTURE, BLOOD (ROUTINE X 2)     Status: None   Collection Time    10/05/13  3:37 PM      Result Value Range Status   Specimen Description BLOOD RIGHT HAND   Final   Special Requests     Final   Value: BOTTLES DRAWN AEROBIC AND ANAEROBIC 10CC AER,6CC ANA   Culture  Setup Time     Final   Value: 10/05/2013 22:08     Performed at [facility]   Culture     Final   Value: ESCHERICHIA COLI     Note: Confirmed Extended Spectrum Beta-Lactamase Producer (ESBL) CRITICAL RESULT CALLED TO, READ BACK BY AND VERIFIED WITH: SHINITA LEE @ 0740 10/08/13 BY KRAWS     Note: Gram Stain Report Called to,Read Back By and Verified With: Wonda Cerise @ 1349 10/06/13 BY KRAWS     Performed  at [facility]   Report Status 10/08/2013 FINAL   Final   Organism ID, Bacteria ESCHERICHIA COLI   Final  CULTURE, BLOOD (ROUTINE X 2)     Status: None   Collection Time    10/05/13  3:42 PM  Result Value Range Status   Specimen Description BLOOD LEFT HAND   Final   Special Requests BOTTLES DRAWN AEROBIC ONLY 3CC   Final   Culture  Setup Time     Final   Value: 10/05/2013 22:00     Performed at [facility]   Culture     Final   Value:        BLOOD CULTURE RECEIVED NO GROWTH TO DATE CULTURE WILL BE HELD FOR 5 DAYS BEFORE ISSUING A FINAL NEGATIVE REPORT     Performed at [facility]   Report Status PENDING   Incomplete     Labs: Basic Metabolic Panel:  Recent Labs Lab 10/05/13 0540 10/05/13 1146 10/06/13 0540 10/08/13 0415 10/09/13 0550  NA 141  --  144 140 142  K 3.3*  --  3.9 4.1 4.2  CL 106  --  111 109 113*  CO2 22  --  20 21 19   GLUCOSE 150*  --  129* 135* 103*  BUN 20  --  16 20 13   CREATININE 1.22* 1.14* 1.41* 1.38* 1.12*  CALCIUM 9.7  --  9.2 9.2 9.2   CBC:  Recent Labs Lab 10/05/13 0540 10/05/13 1146 10/06/13 0540 10/09/13 0550  WBC 11.9* 12.7* 12.9* 5.5  HGB 12.4 12.9 11.5* 10.6*  HCT 37.3 38.6 35.4* 32.4*  MCV 94.2 94.8 96.2 96.4  PLT 167 165 131* 157   CBG:  Recent Labs Lab 10/09/13 0850  GLUCAP 134*    SignedKaren Kitchens [phone]  [facility] 10/09/2013, 11:31 AM

## 2013-10-09 NOTE — Procedures (Signed)
RUE PICC 41 cm SVC RA No comp

## 2013-10-09 NOTE — Progress Notes (Signed)
10/09/13 Patient being discharged to [facility]. Peripheral IV site removed, Has a midline placed today in [facility]. Discharge instructions reviewed with husband. Will transport vis Ambulance to facility.

## 2013-10-09 NOTE — Clinical Social Work Psychosocial (Signed)
Clinical Social Work Department BRIEF PSYCHOSOCIAL ASSESSMENT 10/09/2013  Patient:  Patricia [name], Patricia [name]     Account Number:  [id number]     Admit date:  10/05/2013  Clinical Social Worker:  Lovey Newcomer  Date/Time:  10/09/2013 03:02 PM  Referred by:  Physician  Date Referred:  10/09/2013 Referred for  SNF Placement   Other Referral:   Interview type:  Patient Other interview type:   Patient and husband were both interviewed at bedside.    PSYCHOSOCIAL DATA Living Status:  HUSBAND Admitted from facility:   Level of care:   Primary support name:  [name] Primary support relationship to patient:  SPOUSE Degree of support available:   Support is strong.    CURRENT CONCERNS Current Concerns  Post-Acute Placement   Other Concerns:    SOCIAL WORK ASSESSMENT / PLAN CSW met with patient and husband at bedside to discuss their requeset for SNF placement. Patient and husband do not think they will be able to manage at home with HHPT services. Patient would like to be placed at [facility]. Patient was admitted from home with husband and plan is to return home after SNF stay. CSW will follow.   Assessment/plan status:  Psychosocial Support/Ongoing Assessment of Needs Other assessment/ plan:   Complete FL2, Fax, PASRR   Information/referral to community resources:   CSW contact information and SNF list given to patient.    PATIENT'S/FAMILY'S RESPONSE TO PLAN OF CARE: Patient and husband would both like [facility] and are agreeable to placement.Patient and husband were pleasant, appropriate, and appreciative of CSW contact. CSW will follow.     Liz Beach, [place], [place], JI:[phone]

## 2013-10-11 LAB — CULTURE, BLOOD (ROUTINE X 2)
Culture: NO GROWTH
Culture: NO GROWTH

## 2013-10-17 ENCOUNTER — Non-Acute Institutional Stay (SKILLED_NURSING_FACILITY): Payer: Medicare Other | Admitting: Internal Medicine

## 2013-10-17 DIAGNOSIS — N10 Acute tubulo-interstitial nephritis: Secondary | ICD-10-CM

## 2013-10-17 DIAGNOSIS — A419 Sepsis, unspecified organism: Secondary | ICD-10-CM

## 2013-10-17 DIAGNOSIS — R269 Unspecified abnormalities of gait and mobility: Secondary | ICD-10-CM

## 2013-10-17 DIAGNOSIS — B372 Candidiasis of skin and nail: Secondary | ICD-10-CM

## 2013-10-17 NOTE — Progress Notes (Signed)
Patient ID: Patricia [name], female   DOB: 04-07-1932, 80 y.o.   MRN: [id number]  Facility; [facility] Chief complaint; admission to SNF post admit to [facility] from December 22 to December 26  History; this patient is a pleasant 80 year old woman with a complicated past medical history who presented with abdominal pain nausea and vomiting. Cultures came back of her urine and blood positive for ESBL she was changed to Invanz after being started on Primaxin on Christmas Day. She appears to have rapidly improved. Her complex, albeit chronic medical issues are detailed below  Past Medical History  Diagnosis Date  . Renal disorder   . Hx of kidney transplant     right  . Coronary artery disease   . Hypertension   . Thyroid disease   . Multiple allergies   . Abnormal gait   . Reflux   . Diverticulitis   . Hypothyroidism   . Depression   . Anxiety   . COPD, mild   . Dizziness   . Diabetes    Past Surgical History  Procedure Laterality Date  . Nephrectomy transplanted organ    . Tumor removal Meningioma      Brain    Medications; albuterol inhaler 2 puffs every 6 hours when necessary, Norvasc 5 daily daily daily, Imuran 50 twice a day, vitamin B and B1 daily, Symbicort 160/4.52 puffs twice a day "asthma" per the patient, Zyrtec 10 daily, calcium plus D1 tablet twice daily, clonidine 0.1 twice a day, coenzyme Q 100 daily, WelChol 625 1875 mg 2 times daily with a meal cyclosporin 100 twice a day, and Enablex 7.5 daily, TriCor 48 mg daily, Prozac 20 mg daily, Flonase one spray into both nostrils daily when necessary, Lasix 40 twice a day, glucosamine daily, Anaspaz 0.125 sublingual every 6 hours when necessary for bladder spasms lysine 500 mg daily, Synthroid 75 mcg daily, Toprolol XL 100 mg daily, myrbetriq 25 mg daily, low basal 1 g twice daily, Zofran when necessary, protonic 40 daily, potassium exact dose uncertain, is on prophylactic Septra 3 times weekly however that is on hold until  the Invanz his complete, prednisone 5 mg qd  Social; lives with her husband in her own home. It sounds as though she needs ADL assistance walks with a walker  reports that she has never smoked. She has never used smokeless tobacco. She reports that she does not drink alcohol or use illicit drugs.  family history includes Alcoholism in her father; Alzheimer's disease in her mother; High Cholesterol in her mother.  Review of systems Respiratory; has asthma but no wheezing currently Cardiac no chest pain no palpitations has fairly refractory high blood pressure GI reported one tarry bowel movement while in the hospital, it doesn't sound as though she was aggressively investigated. Her hemoglobin was stable Skin pruritic rash in the groin area Gait gradually declining walking over the past year according to her husband. She is followed by [facility] neurologic for this see recent imaging studies below  Physical examination Gen. pleasant woman in no distress. Vitals O2 sat 96% on room air pulse 80 respirations 18 HEENT oral exam is normal Respiratory clear entry bilaterally Cardiac heart sounds are normal no murmurs no carotid bruits    Extremities significant osteoarthritis of both knees some degree of venous stasis peripheral pulses are palpable Neurologic; has a Hoffman's reflex and a Babinski reflex on the right. No pronator drift strength seems mostly intact. Gait is very wide-based and unsteady  Impression/plan #1 pyelonephritis  with sepsis secondary to ESBL Escherichia coli. She will complete her Invanz on January 7. This is the major reason for her being here #2 progressive gait ataxia however this is slowly chronic. She has a wide-based very unsteady gait. Followed by neurology #3 marked urinary frequency followed by urology at [facility] #4 hyperlipidemia #5 status post kidney transplant 4 years ago at [facility] for what sounds like a primary glomerulonephritis. #6 hypothyroidism on  replacement #7 gastroesophageal reflux disease #8 widespread candidal rash. I note she was started on Diflucan yesterday I don't think this is necessary.

## 2013-10-27 ENCOUNTER — Non-Acute Institutional Stay (SKILLED_NURSING_FACILITY): Payer: Medicare Other | Admitting: Family

## 2013-10-27 ENCOUNTER — Encounter: Payer: Self-pay | Admitting: Family

## 2013-10-27 DIAGNOSIS — Z95828 Presence of other vascular implants and grafts: Secondary | ICD-10-CM

## 2013-10-27 DIAGNOSIS — Z8619 Personal history of other infectious and parasitic diseases: Secondary | ICD-10-CM

## 2013-10-27 DIAGNOSIS — Z9889 Other specified postprocedural states: Secondary | ICD-10-CM

## 2013-10-27 NOTE — Progress Notes (Signed)
Patient ID: Patricia [name], female   DOB: 03-13-1932, 80 y.o.   MRN: [id number]  Date: 10/27/13 Facility: [facility]  Code Status:  DNR  Chief Complaint  Patient presents with  . Acute Visit    D/C PICC    HPI: Request made from nursing staff to address pt PICC placement.  Pt was discharged from hospital with orders for IV antibiotics Colbert Ewing) for the treatment of ESBL Ecoli Bacteremia. No further issues/concerns expressed at present by pt or health care team.      Allergies  Allergen Reactions  . Aricept [Donepezil Hcl] Nausea And Vomiting  . Codeine Nausea And Vomiting  . Penicillins Other (See Comments)    Doesn't remember  . Statins Other (See Comments)    Doesn't remember  . Tetracyclines & Related Rash     Medication List       This list is accurate as of: 10/27/13  2:04 PM.  Always use your most recent med list.               albuterol 108 (90 BASE) MCG/ACT inhaler  Commonly known as:  PROVENTIL HFA;VENTOLIN HFA  Inhale 2 puffs into the lungs every 6 (six) hours as needed for wheezing or shortness of breath. Patient uses this medication prn.     amLODipine 5 MG tablet  Commonly known as:  NORVASC  Take 5 mg by mouth daily.     azaTHIOprine 50 MG tablet  Commonly known as:  IMURAN  Take 50 mg by mouth 2 (two) times daily.     B-complex with vitamin C tablet  Take 1 tablet by mouth daily.     budesonide-formoterol 160-4.5 MCG/ACT inhaler  Commonly known as:  SYMBICORT  Inhale 2 puffs into the lungs 2 (two) times daily.     cetirizine 10 MG tablet  Commonly known as:  ZYRTEC  Take 10 mg by mouth daily.     CITRACAL CALCIUM+D PO  Take 1 tablet by mouth 2 (two) times daily.     cloNIDine 0.1 MG tablet  Commonly known as:  CATAPRES  Take 0.1 mg by mouth 2 (two) times daily.     Co Q 10 100 MG Caps  Take 1 tablet by mouth daily.     colesevelam 625 MG tablet  Commonly known as:  WELCHOL  Take 1,875 mg by mouth 2 (two) times daily with a  meal.     cycloSPORINE 100 MG capsule  Commonly known as:  SANDIMMUNE  Take 100 mg by mouth 2 (two) times daily.     ENABLEX 7.5 MG 24 hr tablet  Generic drug:  darifenacin  Take 7.5 mg by mouth daily.     fenofibrate 48 MG tablet  Commonly known as:  TRICOR  Take 48 mg by mouth daily.     FLUoxetine 20 MG tablet  Commonly known as:  PROZAC  Take 20 mg by mouth daily.     fluticasone 50 MCG/ACT nasal spray  Commonly known as:  FLONASE  Place 1 spray into both nostrils daily as needed for allergies or rhinitis.     furosemide 40 MG tablet  Commonly known as:  LASIX  Take 40 mg by mouth 2 (two) times daily.     Glucosamine HCl-MSM 750-750 MG Tabs  Take 1,500 tablets by mouth daily.     hyoscyamine 0.125 MG Tbdp disintergrating tablet  Commonly known as:  ANASPAZ  Place 0.125 mg under the tongue every 6 (six) hours  as needed for bladder spasms. Patient uses this medication prn.     L-Lysine 500 MG Tabs  Take 500 mg by mouth daily.     levothyroxine 75 MCG tablet  Commonly known as:  SYNTHROID, LEVOTHROID  Take 75 mcg by mouth daily.     metoprolol succinate 100 MG 24 hr tablet  Commonly known as:  TOPROL-XL  Take 100 mg by mouth daily. Take with or immediately following a meal.     multivitamin tablet  Take 1 tablet by mouth daily.     MYRBETRIQ 50 MG Tb24 tablet  Generic drug:  mirabegron ER  Take 25 mg by mouth daily.     omega-3 acid ethyl esters 1 G capsule  Commonly known as:  LOVAZA  Take 1 g by mouth 2 (two) times daily.     ondansetron 4 MG disintegrating tablet  Commonly known as:  ZOFRAN ODT  Take 1 tablet (4 mg total) by mouth every 8 (eight) hours as needed for nausea or vomiting.     pantoprazole 40 MG tablet  Commonly known as:  PROTONIX  Take 1 tablet (40 mg total) by mouth daily.     POTASSIUM CHLORIDE PO  Take 300 mcg by mouth daily.     predniSONE 5 MG tablet  Commonly known as:  DELTASONE  Take 5 mg by mouth daily with breakfast.       sulfamethoxazole-trimethoprim 400-80 MG per tablet  Commonly known as:  BACTRIM,SEPTRA  Take 1 tablet by mouth 3 (three) times a week. Patient uses this medication on Mon, Wed., and Friday.  Stop while on INVANZ (IV antibiotic).  Restart after [facility] is finished.         DATA REVIEWED  Radiologic Exams:   Cardiovascular Exams:   Laboratory Studies: 10/12/13-Na 140, K 3.7, Cl 107, BUN 12, Creatinine 1.2, Ca 10.1, WBC 6.1, Hemoglobin 10.9, Hematocrit 35.2, Plt 322     Past Medical History  Diagnosis Date  . Renal disorder   . Hx of kidney transplant     right  . Coronary artery disease   . Hypertension   . Thyroid disease   . Multiple allergies   . Abnormal gait   . Reflux   . Diverticulitis   . Hypothyroidism   . Depression   . Anxiety   . COPD, mild   . Dizziness   . Diabetes      Past Surgical History  Procedure Laterality Date  . Nephrectomy transplanted organ    . Tumor removal      Brain       Review of Systems  Constitutional: Negative.   Respiratory: Negative.   Cardiovascular: Negative.   Gastrointestinal: Negative.   Genitourinary: Negative.   Neurological: Negative.     Physical Exam Filed Vitals:   10/27/13 1359  BP: 147/77  Pulse: 68  Temp: 97.1 F (36.2 C)  Resp: 18   There is no weight on file to calculate BMI. Physical Exam  Constitutional: She is oriented to person, place, and time.  Dressed and groomed appropriately; sitting upright in recliner engaging in exercise routine with therapy.  RUA Picc-dsg intact, site intact   Cardiovascular: Normal rate, regular rhythm and normal heart sounds.   Pulmonary/Chest: Effort normal and breath sounds normal.  Neurological: She is alert and oriented to person, place, and time.    ASSESSMENT/PLAN  CBC w/ diff to assess WBC count to proceed with d/c of PICC V/S q shift x 3 days Notify PCP  if temp >101  Follow up:prn

## 2013-11-05 ENCOUNTER — Non-Acute Institutional Stay (SKILLED_NURSING_FACILITY): Payer: Medicare Other | Admitting: Family

## 2013-11-05 DIAGNOSIS — A419 Sepsis, unspecified organism: Secondary | ICD-10-CM

## 2013-11-05 DIAGNOSIS — R269 Unspecified abnormalities of gait and mobility: Secondary | ICD-10-CM

## 2013-11-05 DIAGNOSIS — Z8619 Personal history of other infectious and parasitic diseases: Secondary | ICD-10-CM

## 2013-11-05 DIAGNOSIS — N10 Acute tubulo-interstitial nephritis: Secondary | ICD-10-CM

## 2013-11-05 NOTE — Progress Notes (Signed)
Patient ID: Patricia [name], female   DOB: 08/28/1932, 80 y.o.   MRN: [id number]  Date: 11/05/13 [facility]    Chief Complaint  Patient presents with  . Discharge Note    HPI: Pt is being discharged following course of rehabilitation.  No issues or concerns expressed at present time.     Allergies  Allergen Reactions  . Aricept [Donepezil Hcl] Nausea And Vomiting  . Codeine Nausea And Vomiting  . Penicillins Other (See Comments)    Doesn't remember  . Statins Other (See Comments)    Doesn't remember  . Tetracyclines & Related Rash     Medication List       This list is accurate as of: 11/05/13 11:59 PM.  Always use your most recent med list.               albuterol 108 (90 BASE) MCG/ACT inhaler  Commonly known as:  PROVENTIL HFA;VENTOLIN HFA  Inhale 2 puffs into the lungs every 6 (six) hours as needed for wheezing or shortness of breath. Patient uses this medication prn.     amLODipine 5 MG tablet  Commonly known as:  NORVASC  Take 5 mg by mouth daily.     azaTHIOprine 50 MG tablet  Commonly known as:  IMURAN  Take 50 mg by mouth 2 (two) times daily.     B-complex with vitamin C tablet  Take 1 tablet by mouth daily.     budesonide-formoterol 160-4.5 MCG/ACT inhaler  Commonly known as:  SYMBICORT  Inhale 2 puffs into the lungs 2 (two) times daily.     cetirizine 10 MG tablet  Commonly known as:  ZYRTEC  Take 10 mg by mouth daily.     CITRACAL CALCIUM+D PO  Take 1 tablet by mouth 2 (two) times daily.     cloNIDine 0.1 MG tablet  Commonly known as:  CATAPRES  Take 0.1 mg by mouth 2 (two) times daily.     Co Q 10 100 MG Caps  Take 1 tablet by mouth daily.     colesevelam 625 MG tablet  Commonly known as:  WELCHOL  Take 1,875 mg by mouth 2 (two) times daily with a meal.     cycloSPORINE 100 MG capsule  Commonly known as:  SANDIMMUNE  Take 100 mg by mouth 2 (two) times daily.     ENABLEX 7.5 MG 24 hr tablet  Generic drug:  darifenacin   Take 7.5 mg by mouth daily.     fenofibrate 48 MG tablet  Commonly known as:  TRICOR  Take 48 mg by mouth daily.     FLUoxetine 20 MG tablet  Commonly known as:  PROZAC  Take 20 mg by mouth daily.     fluticasone 50 MCG/ACT nasal spray  Commonly known as:  FLONASE  Place 1 spray into both nostrils daily as needed for allergies or rhinitis.     furosemide 40 MG tablet  Commonly known as:  LASIX  Take 40 mg by mouth 2 (two) times daily.     Glucosamine HCl-MSM 750-750 MG Tabs  Take 1,500 tablets by mouth 2 (two) times daily.     hyoscyamine 0.125 MG Tbdp disintergrating tablet  Commonly known as:  ANASPAZ  Place 0.125 mg under the tongue every 6 (six) hours as needed for bladder spasms. Patient uses this medication prn.     L-Lysine 500 MG Tabs  Take 500 mg by mouth daily.     levothyroxine 75 MCG  tablet  Commonly known as:  SYNTHROID, LEVOTHROID  Take 75 mcg by mouth daily.     metoprolol succinate 100 MG 24 hr tablet  Commonly known as:  TOPROL-XL  Take 100 mg by mouth daily. Take with or immediately following a meal.     multivitamin tablet  Take 1 tablet by mouth daily.     MYRBETRIQ 50 MG Tb24 tablet  Generic drug:  mirabegron ER  Take 25 mg by mouth daily.     omega-3 acid ethyl esters 1 G capsule  Commonly known as:  LOVAZA  Take 1 g by mouth 2 (two) times daily.     ondansetron 4 MG disintegrating tablet  Commonly known as:  ZOFRAN ODT  Take 1 tablet (4 mg total) by mouth every 8 (eight) hours as needed for nausea or vomiting.     pantoprazole 40 MG tablet  Commonly known as:  PROTONIX  Take 1 tablet (40 mg total) by mouth daily.     POTASSIUM CHLORIDE PO  Take 300 mcg by mouth daily.     predniSONE 5 MG tablet  Commonly known as:  DELTASONE  Take 5 mg by mouth daily with breakfast.     sulfamethoxazole-trimethoprim 400-80 MG per tablet  Commonly known as:  BACTRIM,SEPTRA  Take 1 tablet by mouth 3 (three) times a week. Patient uses this  medication on Mon, Wed., and Friday.  Stop while on INVANZ (IV antibiotic).  Restart after [facility] is finished.        DATA REVIEWED   Laboratory Studies: 10/27/13: WBC 5.5, Hemoglobin 12.8, Hemat 37.6, Plat 199 10/12/13: Na 140, K 3.7, Cl 107, BUN 12, Creat. 1.2, Calcium 10.1 Past Medical History  Diagnosis Date  . Renal disorder   . Hx of kidney transplant     right  . Coronary artery disease   . Hypertension   . Thyroid disease   . Multiple allergies   . Abnormal gait   . Reflux   . Diverticulitis   . Hypothyroidism   . Depression   . Anxiety   . COPD, mild   . Dizziness   . Diabetes      Past Surgical History  Procedure Laterality Date  . Nephrectomy transplanted organ    . Tumor removal      Brain    Review of Systems  Constitutional: Negative.   Respiratory: Negative.   Cardiovascular: Negative.   Neurological: Negative.      Physical Exam Filed Vitals:   11/05/13 1553  BP: 144/83  Pulse: 97  Temp: 97.5 F (36.4 C)  Resp: 20   Physical Exam  Constitutional: She is oriented to person, place, and time.  Cardiovascular: Normal rate and regular rhythm.   Pulmonary/Chest: Effort normal and breath sounds normal.  Neurological: She is alert and oriented to person, place, and time.    ASSESSMENT/PLAN  Pt d/c with Home Health Services Discharge instructions provided  Pt verbalizes understanding and agrees with plan

## 2013-11-22 ENCOUNTER — Encounter (HOSPITAL_COMMUNITY): Payer: Self-pay | Admitting: Emergency Medicine

## 2013-11-22 ENCOUNTER — Observation Stay (HOSPITAL_COMMUNITY)
Admission: EM | Admit: 2013-11-22 | Discharge: 2013-11-23 | Disposition: A | Discharge status: Home / Self Care | Payer: Medicare Other | Attending: Internal Medicine | Admitting: Internal Medicine

## 2013-11-22 DIAGNOSIS — K5289 Other specified noninfective gastroenteritis and colitis: Principal | ICD-10-CM | POA: Insufficient documentation

## 2013-11-22 DIAGNOSIS — R112 Nausea with vomiting, unspecified: Secondary | ICD-10-CM | POA: Diagnosis present

## 2013-11-22 DIAGNOSIS — F411 Generalized anxiety disorder: Secondary | ICD-10-CM | POA: Insufficient documentation

## 2013-11-22 DIAGNOSIS — E039 Hypothyroidism, unspecified: Secondary | ICD-10-CM | POA: Diagnosis present

## 2013-11-22 DIAGNOSIS — I10 Essential (primary) hypertension: Secondary | ICD-10-CM | POA: Diagnosis present

## 2013-11-22 DIAGNOSIS — F32A Depression, unspecified: Secondary | ICD-10-CM | POA: Diagnosis present

## 2013-11-22 DIAGNOSIS — E119 Type 2 diabetes mellitus without complications: Secondary | ICD-10-CM | POA: Insufficient documentation

## 2013-11-22 DIAGNOSIS — F329 Major depressive disorder, single episode, unspecified: Secondary | ICD-10-CM | POA: Diagnosis present

## 2013-11-22 DIAGNOSIS — I2510 Atherosclerotic heart disease of native coronary artery without angina pectoris: Secondary | ICD-10-CM | POA: Insufficient documentation

## 2013-11-22 DIAGNOSIS — J4489 Other specified chronic obstructive pulmonary disease: Secondary | ICD-10-CM | POA: Insufficient documentation

## 2013-11-22 DIAGNOSIS — F3289 Other specified depressive episodes: Secondary | ICD-10-CM | POA: Insufficient documentation

## 2013-11-22 DIAGNOSIS — R300 Dysuria: Secondary | ICD-10-CM | POA: Insufficient documentation

## 2013-11-22 DIAGNOSIS — IMO0001 Reserved for inherently not codable concepts without codable children: Secondary | ICD-10-CM

## 2013-11-22 DIAGNOSIS — K219 Gastro-esophageal reflux disease without esophagitis: Secondary | ICD-10-CM | POA: Insufficient documentation

## 2013-11-22 DIAGNOSIS — R269 Unspecified abnormalities of gait and mobility: Secondary | ICD-10-CM | POA: Insufficient documentation

## 2013-11-22 DIAGNOSIS — J449 Chronic obstructive pulmonary disease, unspecified: Secondary | ICD-10-CM | POA: Insufficient documentation

## 2013-11-22 DIAGNOSIS — K529 Noninfective gastroenteritis and colitis, unspecified: Secondary | ICD-10-CM | POA: Diagnosis present

## 2013-11-22 DIAGNOSIS — Z940 Kidney transplant status: Secondary | ICD-10-CM

## 2013-11-22 DIAGNOSIS — Z905 Acquired absence of kidney: Secondary | ICD-10-CM | POA: Insufficient documentation

## 2013-11-22 LAB — URINALYSIS, ROUTINE W REFLEX MICROSCOPIC
GLUCOSE, UA: NEGATIVE mg/dL
HGB URINE DIPSTICK: NEGATIVE
Ketones, ur: 15 mg/dL — AB
Leukocytes, UA: NEGATIVE
Nitrite: NEGATIVE
Protein, ur: 100 mg/dL — AB
SPECIFIC GRAVITY, URINE: 1.022 (ref 1.005–1.030)
UROBILINOGEN UA: 1 mg/dL (ref 0.0–1.0)
pH: 6 (ref 5.0–8.0)

## 2013-11-22 LAB — COMPREHENSIVE METABOLIC PANEL
ALT: 24 U/L (ref 0–35)
AST: 29 U/L (ref 0–37)
Albumin: 3.8 g/dL (ref 3.5–5.2)
Alkaline Phosphatase: 71 U/L (ref 39–117)
BUN: 13 mg/dL (ref 6–23)
CALCIUM: 10.8 mg/dL — AB (ref 8.4–10.5)
CO2: 19 meq/L (ref 19–32)
CREATININE: 1.06 mg/dL (ref 0.50–1.10)
Chloride: 101 mEq/L (ref 96–112)
GFR calc Af Amer: 56 mL/min — ABNORMAL LOW (ref >90)
GFR, EST NON AFRICAN AMERICAN: 49 mL/min — AB (ref >90)
GLUCOSE: 170 mg/dL — AB (ref 70–99)
Potassium: 3.5 mEq/L — ABNORMAL LOW (ref 3.7–5.3)
Sodium: 139 mEq/L (ref 137–147)
Total Bilirubin: 0.8 mg/dL (ref 0.3–1.2)
Total Protein: 8 g/dL (ref 6.0–8.3)

## 2013-11-22 LAB — CBC WITH DIFFERENTIAL/PLATELET
Basophils Absolute: 0 10*3/uL (ref 0.0–0.1)
Basophils Relative: 0 % (ref 0–1)
Eosinophils Absolute: 0.1 10*3/uL (ref 0.0–0.7)
Eosinophils Relative: 1 % (ref 0–5)
HCT: 44.5 % (ref 36.0–46.0)
HEMOGLOBIN: 15.8 g/dL — AB (ref 12.0–15.0)
Lymphocytes Relative: 25 % (ref 12–46)
Lymphs Abs: 2.3 10*3/uL (ref 0.7–4.0)
MCH: 32 pg (ref 26.0–34.0)
MCHC: 35.5 g/dL (ref 30.0–36.0)
MCV: 90.3 fL (ref 78.0–100.0)
MONOS PCT: 7 % (ref 3–12)
Monocytes Absolute: 0.6 10*3/uL (ref 0.1–1.0)
NEUTROS ABS: 6.5 10*3/uL (ref 1.7–7.7)
Neutrophils Relative %: 68 % (ref 43–77)
PLATELETS: 251 10*3/uL (ref 150–400)
RBC: 4.93 MIL/uL (ref 3.87–5.11)
RDW: 14.6 % (ref 11.5–15.5)
WBC: 9.5 10*3/uL (ref 4.0–10.5)

## 2013-11-22 LAB — URINE MICROSCOPIC-ADD ON

## 2013-11-22 LAB — CG4 I-STAT (LACTIC ACID): LACTIC ACID, VENOUS: 1.85 mmol/L (ref 0.5–2.2)

## 2013-11-22 MED ORDER — ONDANSETRON HCL 4 MG/2ML IJ SOLN
4.0000 mg | Freq: Once | INTRAMUSCULAR | Status: AC
  Administered 2013-11-22: 4 mg via INTRAVENOUS
  Filled 2013-11-22: qty 2

## 2013-11-22 MED ORDER — LEVOTHYROXINE SODIUM 75 MCG PO TABS
75.0000 ug | ORAL_TABLET | Freq: Every day | ORAL | Status: DC
  Administered 2013-11-23: 75 ug via ORAL
  Filled 2013-11-22 (×2): qty 1

## 2013-11-22 MED ORDER — SODIUM CHLORIDE 0.9 % IV BOLUS (SEPSIS)
1000.0000 mL | Freq: Once | INTRAVENOUS | Status: AC
  Administered 2013-11-22: 1000 mL via INTRAVENOUS

## 2013-11-22 MED ORDER — CLONIDINE HCL 0.1 MG PO TABS
0.1000 mg | ORAL_TABLET | Freq: Two times a day (BID) | ORAL | Status: DC
  Administered 2013-11-22 – 2013-11-23 (×2): 0.1 mg via ORAL
  Filled 2013-11-22 (×3): qty 1

## 2013-11-22 MED ORDER — ONDANSETRON HCL 4 MG/2ML IJ SOLN: 4.0000 mg | Freq: Four times a day (QID) | INTRAMUSCULAR | Status: DC | PRN

## 2013-11-22 MED ORDER — MIRABEGRON ER 25 MG PO TB24
25.0000 mg | ORAL_TABLET | Freq: Every day | ORAL | Status: DC
  Administered 2013-11-23: 25 mg via ORAL
  Filled 2013-11-22: qty 1

## 2013-11-22 MED ORDER — AMLODIPINE BESYLATE 5 MG PO TABS
5.0000 mg | ORAL_TABLET | Freq: Every day | ORAL | Status: DC
  Administered 2013-11-22 – 2013-11-23 (×2): 5 mg via ORAL
  Filled 2013-11-22 (×2): qty 1

## 2013-11-22 MED ORDER — DARIFENACIN HYDROBROMIDE ER 7.5 MG PO TB24
7.5000 mg | ORAL_TABLET | Freq: Every day | ORAL | Status: DC
  Administered 2013-11-23: 7.5 mg via ORAL
  Filled 2013-11-22: qty 1

## 2013-11-22 MED ORDER — PROMETHAZINE HCL 25 MG/ML IJ SOLN
12.5000 mg | Freq: Once | INTRAMUSCULAR | Status: AC
  Administered 2013-11-22: 12.5 mg via INTRAVENOUS
  Filled 2013-11-22: qty 1

## 2013-11-22 MED ORDER — METOPROLOL SUCCINATE ER 100 MG PO TB24
100.0000 mg | ORAL_TABLET | Freq: Every day | ORAL | Status: DC
Start: 2013-11-23 — End: 2013-11-23
  Administered 2013-11-23: 100 mg via ORAL
  Filled 2013-11-22: qty 1

## 2013-11-22 MED ORDER — ENOXAPARIN SODIUM 40 MG/0.4ML ~~LOC~~ SOLN
40.0000 mg | Freq: Every day | SUBCUTANEOUS | Status: DC
Start: 2013-11-22 — End: 2013-11-23
  Administered 2013-11-22: 40 mg via SUBCUTANEOUS
  Filled 2013-11-22 (×2): qty 0.4

## 2013-11-22 MED ORDER — FLUOXETINE HCL 20 MG PO TABS
20.0000 mg | ORAL_TABLET | Freq: Every day | ORAL | Status: DC
  Administered 2013-11-23: 20 mg via ORAL
  Filled 2013-11-22: qty 1

## 2013-11-22 MED ORDER — SULFAMETHOXAZOLE-TRIMETHOPRIM 400-80 MG PO TABS
1.0000 | ORAL_TABLET | ORAL | Status: DC
  Administered 2013-11-23: 1 via ORAL
  Filled 2013-11-22: qty 1

## 2013-11-22 MED ORDER — COLESEVELAM HCL 625 MG PO TABS
1875.0000 mg | ORAL_TABLET | Freq: Two times a day (BID) | ORAL | Status: DC
  Administered 2013-11-23: 1875 mg via ORAL
  Filled 2013-11-22 (×3): qty 3

## 2013-11-22 MED ORDER — PANTOPRAZOLE SODIUM 40 MG IV SOLR
40.0000 mg | Freq: Two times a day (BID) | INTRAVENOUS | Status: DC
Start: 2013-11-22 — End: 2013-11-23
  Administered 2013-11-22 – 2013-11-23 (×2): 40 mg via INTRAVENOUS
  Filled 2013-11-22 (×3): qty 40

## 2013-11-22 MED ORDER — SODIUM CHLORIDE 0.9 % IV SOLN
INTRAVENOUS | Status: DC
  Administered 2013-11-22: 21:00:00 via INTRAVENOUS

## 2013-11-22 MED ORDER — CYCLOSPORINE 100 MG PO CAPS
100.0000 mg | ORAL_CAPSULE | Freq: Two times a day (BID) | ORAL | Status: DC
  Administered 2013-11-22 – 2013-11-23 (×2): 100 mg via ORAL
  Filled 2013-11-22 (×3): qty 1

## 2013-11-22 MED ORDER — AZATHIOPRINE 50 MG PO TABS
50.0000 mg | ORAL_TABLET | Freq: Two times a day (BID) | ORAL | Status: DC
  Administered 2013-11-22 – 2013-11-23 (×2): 50 mg via ORAL
  Filled 2013-11-22 (×3): qty 1

## 2013-11-22 MED ORDER — BUDESONIDE-FORMOTEROL FUMARATE 160-4.5 MCG/ACT IN AERO
2.0000 | INHALATION_SPRAY | Freq: Two times a day (BID) | RESPIRATORY_TRACT | Status: DC
  Administered 2013-11-22 – 2013-11-23 (×2): 2 via RESPIRATORY_TRACT
  Filled 2013-11-22: qty 6

## 2013-11-22 MED ORDER — PREDNISONE 5 MG PO TABS
5.0000 mg | ORAL_TABLET | Freq: Every day | ORAL | Status: DC
  Administered 2013-11-23: 5 mg via ORAL
  Filled 2013-11-22 (×2): qty 1

## 2013-11-22 MED ORDER — FLUTICASONE PROPIONATE 50 MCG/ACT NA SUSP: 1.0000 | Freq: Every day | NASAL | Status: DC | PRN

## 2013-11-22 MED ORDER — ONDANSETRON HCL 4 MG PO TABS: 4.0000 mg | ORAL_TABLET | Freq: Four times a day (QID) | ORAL | Status: DC | PRN

## 2013-11-22 NOTE — ED Notes (Signed)
Admitting at bedside 

## 2013-11-22 NOTE — H&P (Signed)
PCP:   [name], MD   Chief Complaint:  Nausea and vomiting  HPI:  80 year old female who  has a past medical history of Renal disorder; kidney transplant; Coronary artery disease; Hypertension; Thyroid disease; Multiple allergies; Abnormal gait; Reflux; Diverticulitis; Hypothyroidism; Depression; Anxiety; COPD, mild; Dizziness; and Diabetes. today presented to the ED with chief complaint of nausea and vomiting it started on Friday, as per patient the vomiting started Friday morning and has continued, she denies any blood in the vomitus, complains of nausea associated with vomiting, denies abdominal pain. She denies fever, did have one loose bowel movement today. No blood in the stool, patient has not been able to take her by mouth medications because of constant throwing up. Also could not keep anything down for last 2 days. Patient also has stuffy nose which she attributes to allergies, denies any body aches did get flu shot this year. She denies chest pain, no shortness of breath, no headache no blurred vision no passing out episode. In the ED patient is found to have normal liver enzymes, has mildly elevated calcium, normal UA.   Allergies:   Allergies  Allergen Reactions  . Aricept [Donepezil Hcl] Nausea And Vomiting  . Codeine Nausea And Vomiting  . Penicillins Other (See Comments)    Doesn't remember  . Statins Other (See Comments)    Doesn't remember  . Tetracyclines & Related Rash      Past Medical History  Diagnosis Date  . Renal disorder   . Hx of kidney transplant     right  . Coronary artery disease   . Hypertension   . Thyroid disease   . Multiple allergies   . Abnormal gait   . Reflux   . Diverticulitis   . Hypothyroidism   . Depression   . Anxiety   . COPD, mild   . Dizziness   . Diabetes     Past Surgical History  Procedure Laterality Date  . Nephrectomy transplanted organ    . Tumor removal      Brain     Prior to Admission medications    Medication Sig Start Date End Date Taking? Authorizing Provider  albuterol (PROVENTIL HFA;VENTOLIN HFA) 108 (90 BASE) MCG/ACT inhaler Inhale 2 puffs into the lungs every 6 (six) hours as needed for wheezing or shortness of breath. Patient uses this medication prn.   Yes Historical Provider, MD  amLODipine (NORVASC) 5 MG tablet Take 5 mg by mouth daily. 06/25/13  Yes Historical Provider, MD  azaTHIOprine (IMURAN) 50 MG tablet Take 50 mg by mouth 2 (two) times daily.    Yes Historical Provider, MD  B Complex-C (B-COMPLEX WITH VITAMIN C) tablet Take 1 tablet by mouth daily.   Yes Historical Provider, MD  budesonide-formoterol (SYMBICORT) 160-4.5 MCG/ACT inhaler Inhale 2 puffs into the lungs 2 (two) times daily.   Yes Historical Provider, MD  Calcium-Magnesium-Vitamin D (CITRACAL CALCIUM+D PO) Take 1 tablet by mouth 2 (two) times daily.    Yes Historical Provider, MD  cetirizine (ZYRTEC) 10 MG tablet Take 10 mg by mouth daily.   Yes Historical Provider, MD  cloNIDine (CATAPRES) 0.1 MG tablet Take 0.1 mg by mouth 2 (two) times daily.   Yes Historical Provider, MD  Coenzyme Q10 (CO Q 10) 100 MG CAPS Take 1 tablet by mouth daily.    Yes Historical Provider, MD  colesevelam (WELCHOL) 625 MG tablet Take 1,875 mg by mouth 2 (two) times daily with a meal.   Yes Historical Provider, MD  cycloSPORINE (SANDIMMUNE) 100 MG capsule Take 100 mg by mouth 2 (two) times daily.   Yes Historical Provider, MD  ENABLEX 7.5 MG 24 hr tablet Take 7.5 mg by mouth daily. 06/16/13  Yes Historical Provider, MD  fenofibrate (TRICOR) 48 MG tablet Take 48 mg by mouth daily.   Yes Historical Provider, MD  FLUoxetine (PROZAC) 20 MG tablet Take 20 mg by mouth daily.   Yes Historical Provider, MD  fluticasone (FLONASE) 50 MCG/ACT nasal spray Place 1 spray into both nostrils daily as needed for allergies or rhinitis.   Yes Historical Provider, MD  furosemide (LASIX) 40 MG tablet Take 40 mg by mouth 2 (two) times daily.    Yes Historical  Provider, MD  Glucosamine HCl-MSM 750-750 MG TABS Take 1,500 tablets by mouth 2 (two) times daily.    Yes Historical Provider, MD  hyoscyamine (ANASPAZ) 0.125 MG TBDP Place 0.125 mg under the tongue every 6 (six) hours as needed for bladder spasms. Patient uses this medication prn.   Yes Historical Provider, MD  L-Lysine 500 MG TABS Take 500 mg by mouth daily.   Yes Historical Provider, MD  levothyroxine (SYNTHROID, LEVOTHROID) 75 MCG tablet Take 75 mcg by mouth daily.   Yes Historical Provider, MD  metoprolol succinate (TOPROL-XL) 100 MG 24 hr tablet Take 100 mg by mouth daily. Take with or immediately following a meal.   Yes Historical Provider, MD  mirabegron ER (MYRBETRIQ) 25 MG TB24 tablet Take 25 mg by mouth daily.   Yes Historical Provider, MD  Multiple Vitamin (MULTIVITAMIN) tablet Take 1 tablet by mouth daily.   Yes Historical Provider, MD  omega-3 acid ethyl esters (LOVAZA) 1 G capsule Take 1 g by mouth 2 (two) times daily.   Yes Historical Provider, MD  omeprazole (PRILOSEC) 20 MG capsule Take 20 mg by mouth daily.   Yes Historical Provider, MD  POTASSIUM CHLORIDE PO Take 300 mcg by mouth daily.    Yes Historical Provider, MD  predniSONE (DELTASONE) 5 MG tablet Take 5 mg by mouth daily with breakfast.   Yes Historical Provider, MD  sulfamethoxazole-trimethoprim (BACTRIM,SEPTRA) 400-80 MG per tablet Take 1 tablet by mouth 3 (three) times a week. Monday, Wednesday, and Friday.   Yes Historical Provider, MD    Social History:  reports that she has never smoked. She has never used smokeless tobacco. She reports that she does not drink alcohol or use illicit drugs.  Family History  Problem Relation Age of Onset  . Alzheimer's disease Mother   . Alcoholism Father   . High Cholesterol Mother      All the positives are listed in BOLD  Review of Systems:  HEENT: Headache, blurred vision, runny nose, sore throat Neck: Hypothyroidism, hyperthyroidism,,lymphadenopathy Chest : Shortness  of breath, history of COPD, Asthma Heart : Chest pain, history of coronary arterey disease GI:  Nausea, vomiting, diarrhea, constipation, GERD GU: Dysuria, urgency, frequency of urination, hematuria Neuro: Stroke, seizures, syncope Psych: Depression, anxiety, hallucinations   Physical Exam: Blood pressure 155/70, pulse 61, temperature 98.2 F (36.8 C), temperature source Oral, resp. rate 18, SpO2 98.00%. Constitutional:   Patient is a well-developed and well-nourished female* in no acute distress and cooperative with exam. Head: Normocephalic and atraumatic Mouth: Mucus membranes moist Eyes: PERRL, EOMI, conjunctivae normal Neck: Supple, No Thyromegaly Cardiovascular: RRR, S1 normal, S2 normal Pulmonary/Chest: CTAB, no wheezes, rales, or rhonchi Abdominal: Soft. Non-tender, non-distended, bowel sounds are normal, no masses, organomegaly, or guarding present.  Neurological: A&O x3, Strenght is normal  and symmetric bilaterally, cranial nerve II-XII are grossly intact, no focal motor deficit, sensory intact to light touch bilaterally.  Extremities : No Cyanosis, Clubbing or Edema   Labs on Admission:  Results for orders placed during the hospital encounter of 11/22/13 (from the past 48 hour(s))  COMPREHENSIVE METABOLIC PANEL     Status: Abnormal   Collection Time    11/22/13  3:15 PM      Result Value Range   Sodium 139  137 - 147 mEq/L   Potassium 3.5 (*) 3.7 - 5.3 mEq/L   Chloride 101  96 - 112 mEq/L   CO2 19  19 - 32 mEq/L   Glucose, Bld 170 (*) 70 - 99 mg/dL   BUN 13  6 - 23 mg/dL   Creatinine, Ser 1.06  0.50 - 1.10 mg/dL   Calcium 10.8 (*) 8.4 - 10.5 mg/dL   Total Protein 8.0  6.0 - 8.3 g/dL   Albumin 3.8  3.5 - 5.2 g/dL   AST 29  0 - 37 U/L   ALT 24  0 - 35 U/L   Alkaline Phosphatase 71  39 - 117 U/L   Total Bilirubin 0.8  0.3 - 1.2 mg/dL   GFR calc non Af Amer 49 (*) >90 mL/min   GFR calc Af Amer 56 (*) >90 mL/min   Comment: (NOTE)     The eGFR has been calculated  using the CKD EPI equation.     This calculation has not been validated in all clinical situations.     eGFR's persistently <90 mL/min signify possible Chronic Kidney     Disease.  CBC WITH DIFFERENTIAL     Status: Abnormal   Collection Time    11/22/13  3:15 PM      Result Value Range   WBC 9.5  4.0 - 10.5 K/uL   RBC 4.93  3.87 - 5.11 MIL/uL   Hemoglobin 15.8 (*) 12.0 - 15.0 g/dL   HCT 44.5  36.0 - 46.0 %   MCV 90.3  78.0 - 100.0 fL   MCH 32.0  26.0 - 34.0 pg   MCHC 35.5  30.0 - 36.0 g/dL   RDW 14.6  11.5 - 15.5 %   Platelets 251  150 - 400 K/uL   Neutrophils Relative % 68  43 - 77 %   Neutro Abs 6.5  1.7 - 7.7 K/uL   Lymphocytes Relative 25  12 - 46 %   Lymphs Abs 2.3  0.7 - 4.0 K/uL   Monocytes Relative 7  3 - 12 %   Monocytes Absolute 0.6  0.1 - 1.0 K/uL   Eosinophils Relative 1  0 - 5 %   Eosinophils Absolute 0.1  0.0 - 0.7 K/uL   Basophils Relative 0  0 - 1 %   Basophils Absolute 0.0  0.0 - 0.1 K/uL  CG4 I-STAT (LACTIC ACID)     Status: None   Collection Time    11/22/13  4:54 PM      Result Value Range   Lactic Acid, Venous 1.85  0.5 - 2.2 mmol/L  URINALYSIS, ROUTINE W REFLEX MICROSCOPIC     Status: Abnormal   Collection Time    11/22/13  5:32 PM      Result Value Range   Color, Urine YELLOW  YELLOW   APPearance CLEAR  CLEAR   Specific Gravity, Urine 1.022  1.005 - 1.030   pH 6.0  5.0 - 8.0   Glucose, UA NEGATIVE  NEGATIVE  mg/dL   Hgb urine dipstick NEGATIVE  NEGATIVE   Bilirubin Urine SMALL (*) NEGATIVE   Ketones, ur 15 (*) NEGATIVE mg/dL   Protein, ur 100 (*) NEGATIVE mg/dL   Urobilinogen, UA 1.0  0.0 - 1.0 mg/dL   Nitrite NEGATIVE  NEGATIVE   Leukocytes, UA NEGATIVE  NEGATIVE  URINE MICROSCOPIC-ADD ON     Status: None   Collection Time    11/22/13  5:32 PM      Result Value Range   Squamous Epithelial / LPF RARE  RARE   WBC, UA 0-2  <3 WBC/hpf   RBC / HPF 0-2  <3 RBC/hpf   Bacteria, UA RARE  RARE   Urine-Other MUCOUS PRESENT     Comment: FEW YEAST     Radiological Exams on Admission: No results found.  Assessment/Plan Principal Problem:   Gastroenteritis Active Problems:   Hx of kidney transplant   Hypertension   Hypothyroidism   Depression   Nausea & vomiting   Hypercalcemia  Nausea vomiting ?  Gastroenteritis, admit the patient and start IV fluids. She does have a history of GERD so we'll start IV abdominal 40 mg every 12 hours. Liver enzymes are normal she does have mildly elevated calcium which could be attributing to her nausea vomiting. I will also obtain EKG and obtain 3 sets of cardiac enzymes to rule out underlying cardiac cause.  Mild hypercalcemia Patient's corrected calcium is 10.96, her last calcium in December 2014 was 9.2. We'll start the patient on IV normal saline, and recheck calcium in the morning. If patient's symptoms improve with normal his him of calcium most likely mild hypercalcemia could have contributed to nausea and vomiting.  History of kidney transplant Will continue patient's immunosuppressants  Hypertension Continue Catapres, amlodipine, Toprol XL  DVT prophylaxis Heparin   Code status: Patient is to code  Family discussion: Discussed with husband at bedside  Time Spent on Admission: 47 min  [facility] Pager: [phone] 11/22/2013, 7:47 PM  If 7PM-7AM, please contact night-coverage  [email or web address]  Password [email or web address]

## 2013-11-22 NOTE — ED Notes (Signed)
Pt is here with dysuria and vomiting for 2 days.  Had same symptoms in December and was diagnosed with UTI.  Pt denies abdominal or chest pain

## 2013-11-22 NOTE — ED Notes (Signed)
Gave patient mask to wear since Kidney transplant and will try to find room.

## 2013-11-22 NOTE — ED Provider Notes (Signed)
CSN: [id number]     Arrival date & time 11/22/13  1449 History   First MD Initiated Contact with Patient 11/22/13 1611     Chief Complaint  Patient presents with  . Emesis  . Dysuria   (Consider location/radiation/quality/duration/timing/severity/associated sxs/prior Treatment) Patient is a 80 y.o. female presenting with vomiting and dysuria. The history is provided by the patient.  Emesis Severity:  Moderate Duration:  2 days Timing:  Constant Quality:  Bilious material and stomach contents Progression:  Worsening Chronicity:  New Recent urination:  Decreased Relieved by:  Nothing Worsened by:  Nothing tried Associated symptoms: diarrhea   Associated symptoms: no abdominal pain, no chills, no cough, no fever, no sore throat and no URI   Risk factors comment:  Kidney transplant patient Dysuria Associated symptoms: vomiting   Associated symptoms: no abdominal pain     Past Medical History  Diagnosis Date  . Renal disorder   . Hx of kidney transplant     right  . Coronary artery disease   . Hypertension   . Thyroid disease   . Multiple allergies   . Abnormal gait   . Reflux   . Diverticulitis   . Hypothyroidism   . Depression   . Anxiety   . COPD, mild   . Dizziness   . Diabetes    Past Surgical History  Procedure Laterality Date  . Nephrectomy transplanted organ    . Tumor removal      Brain    Family History  Problem Relation Age of Onset  . Alzheimer's disease Mother   . Alcoholism Father   . High Cholesterol Mother    History  Substance Use Topics  . Smoking status: Never Smoker   . Smokeless tobacco: Never Used  . Alcohol Use: No   OB History   Grav Para Term Preterm Abortions TAB SAB Ect Mult Living                 Review of Systems  Constitutional: Negative for chills.  HENT: Negative for sore throat.   Respiratory: Negative for cough and shortness of breath.   Gastrointestinal: Positive for vomiting and diarrhea. Negative for abdominal  pain.  Genitourinary: Positive for dysuria.  All other systems reviewed and are negative.    Allergies  Aricept; Codeine; Penicillins; Statins; and Tetracyclines & related  Home Medications   Current Outpatient Rx  Name  Route  Sig  Dispense  Refill  . albuterol (PROVENTIL HFA;VENTOLIN HFA) 108 (90 BASE) MCG/ACT inhaler   Inhalation   Inhale 2 puffs into the lungs every 6 (six) hours as needed for wheezing or shortness of breath. Patient uses this medication prn.         Marland Kitchen amLODipine (NORVASC) 5 MG tablet   Oral   Take 5 mg by mouth daily.         Marland Kitchen azaTHIOprine (IMURAN) 50 MG tablet   Oral   Take 50 mg by mouth 2 (two) times daily.          . B Complex-C (B-COMPLEX WITH VITAMIN C) tablet   Oral   Take 1 tablet by mouth daily.         . budesonide-formoterol (SYMBICORT) 160-4.5 MCG/ACT inhaler   Inhalation   Inhale 2 puffs into the lungs 2 (two) times daily.         . Calcium-Magnesium-Vitamin D (CITRACAL CALCIUM+D PO)   Oral   Take 1 tablet by mouth 2 (two) times daily.          Marland Kitchen  cetirizine (ZYRTEC) 10 MG tablet   Oral   Take 10 mg by mouth daily.         . cloNIDine (CATAPRES) 0.1 MG tablet   Oral   Take 0.1 mg by mouth 2 (two) times daily.         . Coenzyme Q10 (CO Q 10) 100 MG CAPS   Oral   Take 1 tablet by mouth daily.          . colesevelam (WELCHOL) 625 MG tablet   Oral   Take 1,875 mg by mouth 2 (two) times daily with a meal.         . cycloSPORINE (SANDIMMUNE) 100 MG capsule   Oral   Take 100 mg by mouth 2 (two) times daily.         . ENABLEX 7.5 MG 24 hr tablet   Oral   Take 7.5 mg by mouth daily.         . fenofibrate (TRICOR) 48 MG tablet   Oral   Take 48 mg by mouth daily.         Marland Kitchen FLUoxetine (PROZAC) 20 MG tablet   Oral   Take 20 mg by mouth daily.         . fluticasone (FLONASE) 50 MCG/ACT nasal spray   Each Nare   Place 1 spray into both nostrils daily as needed for allergies or rhinitis.          . furosemide (LASIX) 40 MG tablet   Oral   Take 40 mg by mouth 2 (two) times daily.          . Glucosamine HCl-MSM 750-750 MG TABS   Oral   Take 1,500 tablets by mouth daily.          . hyoscyamine (ANASPAZ) 0.125 MG TBDP   Sublingual   Place 0.125 mg under the tongue every 6 (six) hours as needed for bladder spasms. Patient uses this medication prn.         Marland Kitchen L-Lysine 500 MG TABS   Oral   Take 500 mg by mouth daily.         Marland Kitchen levothyroxine (SYNTHROID, LEVOTHROID) 75 MCG tablet   Oral   Take 75 mcg by mouth daily.         . metoprolol succinate (TOPROL-XL) 100 MG 24 hr tablet   Oral   Take 100 mg by mouth daily. Take with or immediately following a meal.         . mirabegron ER (MYRBETRIQ) 50 MG TB24   Oral   Take 25 mg by mouth daily.          . Multiple Vitamin (MULTIVITAMIN) tablet   Oral   Take 1 tablet by mouth daily.         Marland Kitchen omega-3 acid ethyl esters (LOVAZA) 1 G capsule   Oral   Take 1 g by mouth 2 (two) times daily.         . ondansetron (ZOFRAN ODT) 4 MG disintegrating tablet   Oral   Take 1 tablet (4 mg total) by mouth every 8 (eight) hours as needed for nausea or vomiting.   30 tablet   0   . pantoprazole (PROTONIX) 40 MG tablet   Oral   Take 1 tablet (40 mg total) by mouth daily.         Marland Kitchen POTASSIUM CHLORIDE PO   Oral   Take 300 mcg by mouth daily.          Marland Kitchen  predniSONE (DELTASONE) 5 MG tablet   Oral   Take 5 mg by mouth daily with breakfast.         . sulfamethoxazole-trimethoprim (BACTRIM,SEPTRA) 400-80 MG per tablet   Oral   Take 1 tablet by mouth 3 (three) times a week. Patient uses this medication on Mon, Wed., and Friday.  Stop while on INVANZ (IV antibiotic).  Restart after [facility] is finished.          BP 163/91  Pulse 72  Temp(Src) 98.2 F (36.8 C) (Oral)  Resp 20  SpO2 97% Physical Exam  Nursing note and vitals reviewed. Constitutional: She is oriented to person, place, and time. She appears  well-developed and well-nourished. No distress.  HENT:  Head: Normocephalic and atraumatic.  Eyes: EOM are normal. Pupils are equal, round, and reactive to light.  Neck: Normal range of motion. Neck supple.  Cardiovascular: Normal rate and regular rhythm.  Exam reveals no friction rub.   No murmur heard. Pulmonary/Chest: Effort normal and breath sounds normal. No respiratory distress. She has no wheezes. She has no rales.  Abdominal: Soft. She exhibits no distension. There is no tenderness. There is no rebound.  No pain over graft  Musculoskeletal: Normal range of motion. She exhibits no edema.  Neurological: She is alert and oriented to person, place, and time.  Skin: She is not diaphoretic.    ED Course  Procedures (including critical care time) Labs Review Labs Reviewed  COMPREHENSIVE METABOLIC PANEL - Abnormal; Notable for the following:    Potassium 3.5 (*)    Glucose, Bld 170 (*)    Calcium 10.8 (*)    GFR calc non Af Amer 49 (*)    GFR calc Af Amer 56 (*)    All other components within normal limits  CBC WITH DIFFERENTIAL - Abnormal; Notable for the following:    Hemoglobin 15.8 (*)    All other components within normal limits  URINALYSIS, ROUTINE W REFLEX MICROSCOPIC   Imaging Review No results found.  EKG Interpretation   None       MDM   1. Gastroenteritis   2. History of renal transplant   3. Hypercalcemia   4. Hypertension   5. Hypothyroidism    37F with hx of kidney transplant presents with N/V/D. Present for past few days. Hx of similar episodes over past few months. No fevers. Decreased urination today. AFVSS here. Belly benign. Will check labs, hydrate, anticipate admission.  Labs normal. Tolerating PO. Admitted by medicine.   Osvaldo Shipper, MD 11/22/13 2003

## 2013-11-23 DIAGNOSIS — E039 Hypothyroidism, unspecified: Secondary | ICD-10-CM

## 2013-11-23 DIAGNOSIS — I10 Essential (primary) hypertension: Secondary | ICD-10-CM

## 2013-11-23 DIAGNOSIS — F329 Major depressive disorder, single episode, unspecified: Secondary | ICD-10-CM

## 2013-11-23 DIAGNOSIS — K5289 Other specified noninfective gastroenteritis and colitis: Principal | ICD-10-CM

## 2013-11-23 DIAGNOSIS — K219 Gastro-esophageal reflux disease without esophagitis: Secondary | ICD-10-CM

## 2013-11-23 DIAGNOSIS — R112 Nausea with vomiting, unspecified: Secondary | ICD-10-CM

## 2013-11-23 DIAGNOSIS — F3289 Other specified depressive episodes: Secondary | ICD-10-CM

## 2013-11-23 DIAGNOSIS — J449 Chronic obstructive pulmonary disease, unspecified: Secondary | ICD-10-CM

## 2013-11-23 LAB — CBC
HEMATOCRIT: 35.6 % — AB (ref 36.0–46.0)
Hemoglobin: 12 g/dL (ref 12.0–15.0)
MCH: 31 pg (ref 26.0–34.0)
MCHC: 33.7 g/dL (ref 30.0–36.0)
MCV: 92 fL (ref 78.0–100.0)
Platelets: 174 10*3/uL (ref 150–400)
RBC: 3.87 MIL/uL (ref 3.87–5.11)
RDW: 14.8 % (ref 11.5–15.5)
WBC: 4.3 10*3/uL (ref 4.0–10.5)

## 2013-11-23 LAB — COMPREHENSIVE METABOLIC PANEL
ALBUMIN: 2.8 g/dL — AB (ref 3.5–5.2)
ALT: 15 U/L (ref 0–35)
AST: 20 U/L (ref 0–37)
Alkaline Phosphatase: 48 U/L (ref 39–117)
BUN: 10 mg/dL (ref 6–23)
CO2: 21 mEq/L (ref 19–32)
Calcium: 9 mg/dL (ref 8.4–10.5)
Chloride: 114 mEq/L — ABNORMAL HIGH (ref 96–112)
Creatinine, Ser: 0.99 mg/dL (ref 0.50–1.10)
GFR calc Af Amer: 61 mL/min — ABNORMAL LOW (ref >90)
GFR calc non Af Amer: 53 mL/min — ABNORMAL LOW (ref >90)
Glucose, Bld: 108 mg/dL — ABNORMAL HIGH (ref 70–99)
Potassium: 3.5 mEq/L — ABNORMAL LOW (ref 3.7–5.3)
SODIUM: 147 meq/L (ref 137–147)
TOTAL PROTEIN: 5.9 g/dL — AB (ref 6.0–8.3)
Total Bilirubin: 0.5 mg/dL (ref 0.3–1.2)

## 2013-11-23 MED ORDER — PANTOPRAZOLE SODIUM 40 MG PO TBEC
40.0000 mg | DELAYED_RELEASE_TABLET | Freq: Two times a day (BID) | ORAL | Status: DC
  Administered 2013-11-23: 40 mg via ORAL
  Filled 2013-11-23: qty 1

## 2013-11-23 MED ORDER — PANTOPRAZOLE SODIUM 40 MG PO TBEC: 40.0000 mg | DELAYED_RELEASE_TABLET | Freq: Two times a day (BID) | ORAL | Status: DC

## 2013-11-23 MED ORDER — METOCLOPRAMIDE HCL 5 MG PO TABS: 5.0000 mg | ORAL_TABLET | Freq: Three times a day (TID) | ORAL | Status: DC

## 2013-11-23 MED ORDER — METOCLOPRAMIDE HCL 5 MG PO TABS
5.0000 mg | ORAL_TABLET | Freq: Three times a day (TID) | ORAL | Status: DC
  Administered 2013-11-23: 5 mg via ORAL
  Filled 2013-11-23: qty 1

## 2013-11-23 NOTE — Discharge Summary (Signed)
Physician Discharge Summary  Patricia [name] [id number] DOB: Jan 31, 1932 DOA: 11/22/2013  PCP: [name], MD  Admit date: 11/22/2013 Discharge date: 11/23/2013  Time spent: >30 minutes  Recommendations for Outpatient Follow-up:  1. BMET to follow renal function and electrolytes 2. Reassess BP and if needed adjust medications 3. Follow up reflux/nausea/vomiting symptoms and response to empirical treatment; if persist will need GI consultation.  Discharge Diagnoses:  Principal Problem:   Gastroenteritis Active Problems:   Hx of kidney transplant   Hypertension   Hypothyroidism   Depression   Nausea & vomiting   Hypercalcemia   Discharge Condition: stable and improved. Will discharge home and patient will follow with PCP in 10 days.  Diet recommendation: low sodium diet   Filed Weights   11/22/13 2034  Weight: 78.2 kg (172 lb 6.4 oz)    History of present illness:  80 year old female who has a past medical history of Renal disorder; kidney transplant; Coronary artery disease; Hypertension; Thyroid disease; Multiple allergies; Abnormal gait; Reflux; Diverticulitis; Hypothyroidism; Depression; Anxiety; COPD, mild; Dizziness; and Diabetes. today presented to the ED with chief complaint of nausea and vomiting it started on Friday, as per patient the vomiting started Friday morning and has continued, she denies any blood in the vomitus, complains of nausea associated with vomiting, denies abdominal pain. She denies fever, did have one loose bowel movement today. No blood in the stool, patient has not been able to take her by mouth medications because of constant throwing up. Also could not keep anything down for last 2 days. Patient also has stuffy nose which she attributes to allergies, denies any body aches did get flu shot this year. She denies chest pain, no shortness of breath, no headache no blurred vision no passing out episode.  In the ED patient is found to have normal  liver enzymes, has mildly elevated calcium, normal UA.   Hospital Course:  1-Nausea/vomiting: per patient is the third time in her life she ended in the hospital with similar symptoms. Denies hematemesis, melena or hematochezia.  -symptoms improved/resolved with supportive care and bowel rest -concerns for mild gastroparesis along with gastroenteritis (most likely from reflux disease) -will discharge on low dose reglan TID and will adjust PPI to twice a day -patient advise to keep herself well hydrated and to follow small multiple meals a day  -if symptoms continue affecting her despite approach, will need GI outpatient referral for further eval/treatment  2-Hypercalcemia: due to dehydration. -resolved with IVF's  3-Hx of kidney transplant: Cr remains stable. -will continue immunosuppressants and prednisone  4-HTN: continue home regimen and low sodium diet  5-hypothyroidism: continue synthroid  *Rest of medical problems remains stable and the plan is to continue current regimen and to follow with PCP as an outpatient for any further medication adjustments.  Procedures: See below for x-ray reports  Consultations:  None   Discharge Exam: Filed Vitals:   11/23/13 0811  BP: 151/56  Pulse: 80  Temp: 97.9 F (36.6 C)  Resp: 16    General: afebrile, no further nausea or vomiting, denies CP or SOB Cardiovascular: S1 and S2, no rubs or gallops Respiratory: CTA bilaterally Abdomen: soft, NT, ND, positive BS Extremities: trace edema, no cyanosis Neurologic: non focal.  Discharge Instructions  Discharge Orders   Future Appointments Provider Department Dept Phone   02/11/2014 11:30 AM Marcial Pacas, MD [facility] [phone]   Future Orders Complete By Expires   Diet - low sodium heart healthy  As directed  Discharge instructions  As directed    Comments:     Take medications as prescribed Small multiple meals Keep yourself well hydrated Arrange follow  up with PCP in 10 days       Medication List    STOP taking these medications       omeprazole 20 MG capsule  Commonly known as:  PRILOSEC      TAKE these medications       albuterol 108 (90 BASE) MCG/ACT inhaler  Commonly known as:  PROVENTIL HFA;VENTOLIN HFA  Inhale 2 puffs into the lungs every 6 (six) hours as needed for wheezing or shortness of breath. Patient uses this medication prn.     amLODipine 5 MG tablet  Commonly known as:  NORVASC  Take 5 mg by mouth daily.     azaTHIOprine 50 MG tablet  Commonly known as:  IMURAN  Take 50 mg by mouth 2 (two) times daily.     B-complex with vitamin C tablet  Take 1 tablet by mouth daily.     budesonide-formoterol 160-4.5 MCG/ACT inhaler  Commonly known as:  SYMBICORT  Inhale 2 puffs into the lungs 2 (two) times daily.     cetirizine 10 MG tablet  Commonly known as:  ZYRTEC  Take 10 mg by mouth daily.     CITRACAL CALCIUM+D PO  Take 1 tablet by mouth 2 (two) times daily.     cloNIDine 0.1 MG tablet  Commonly known as:  CATAPRES  Take 0.1 mg by mouth 2 (two) times daily.     Co Q 10 100 MG Caps  Take 1 tablet by mouth daily.     colesevelam 625 MG tablet  Commonly known as:  WELCHOL  Take 1,875 mg by mouth 2 (two) times daily with a meal.     cycloSPORINE 100 MG capsule  Commonly known as:  SANDIMMUNE  Take 100 mg by mouth 2 (two) times daily.     ENABLEX 7.5 MG 24 hr tablet  Generic drug:  darifenacin  Take 7.5 mg by mouth daily.     fenofibrate 48 MG tablet  Commonly known as:  TRICOR  Take 48 mg by mouth daily.     FLUoxetine 20 MG tablet  Commonly known as:  PROZAC  Take 20 mg by mouth daily.     fluticasone 50 MCG/ACT nasal spray  Commonly known as:  FLONASE  Place 1 spray into both nostrils daily as needed for allergies or rhinitis.     furosemide 40 MG tablet  Commonly known as:  LASIX  Take 40 mg by mouth 2 (two) times daily.     Glucosamine HCl-MSM 750-750 MG Tabs  Take 1,500 tablets by  mouth 2 (two) times daily.     hyoscyamine 0.125 MG Tbdp disintergrating tablet  Commonly known as:  ANASPAZ  Place 0.125 mg under the tongue every 6 (six) hours as needed for bladder spasms. Patient uses this medication prn.     L-Lysine 500 MG Tabs  Take 500 mg by mouth daily.     levothyroxine 75 MCG tablet  Commonly known as:  SYNTHROID, LEVOTHROID  Take 75 mcg by mouth daily.     metoCLOPramide 5 MG tablet  Commonly known as:  REGLAN  Take 1 tablet (5 mg total) by mouth 3 (three) times daily before meals.     metoprolol succinate 100 MG 24 hr tablet  Commonly known as:  TOPROL-XL  Take 100 mg by mouth daily. Take with or immediately  following a meal.     multivitamin tablet  Take 1 tablet by mouth daily.     MYRBETRIQ 25 MG Tb24 tablet  Generic drug:  mirabegron ER  Take 25 mg by mouth daily.     omega-3 acid ethyl esters 1 G capsule  Commonly known as:  LOVAZA  Take 1 g by mouth 2 (two) times daily.     pantoprazole 40 MG tablet  Commonly known as:  PROTONIX  Take 1 tablet (40 mg total) by mouth 2 (two) times daily.     POTASSIUM CHLORIDE PO  Take 300 mcg by mouth daily.     predniSONE 5 MG tablet  Commonly known as:  DELTASONE  Take 5 mg by mouth daily with breakfast.     sulfamethoxazole-trimethoprim 400-80 MG per tablet  Commonly known as:  BACTRIM,SEPTRA  Take 1 tablet by mouth 3 (three) times a week. Monday, Wednesday, and Friday.       Allergies  Allergen Reactions  . Aricept [Donepezil Hcl] Nausea And Vomiting  . Codeine Nausea And Vomiting  . Penicillins Other (See Comments)    Doesn't remember  . Statins Other (See Comments)    Doesn't remember  . Tetracyclines & Related Rash       Follow-up Information   Schedule an appointment as soon as possible for a visit with [name], MD.   Specialty:  Internal Medicine   Contact information:   [address] [place] [address] [phone]       The results of  significant diagnostics from this hospitalization (including imaging, microbiology, ancillary and laboratory) are listed below for reference.     Labs: Basic Metabolic Panel:  Recent Labs Lab 11/22/13 1515 11/23/13 0717  NA 139 147  K 3.5* 3.5*  CL 101 114*  CO2 19 21  GLUCOSE 170* 108*  BUN 13 10  CREATININE 1.06 0.99  CALCIUM 10.8* 9.0   Liver Function Tests:  Recent Labs Lab 11/22/13 1515 11/23/13 0717  AST 29 20  ALT 24 15  ALKPHOS 71 48  BILITOT 0.8 0.5  PROT 8.0 5.9*  ALBUMIN 3.8 2.8*   CBC:  Recent Labs Lab 11/22/13 1515 11/23/13 0717  WBC 9.5 4.3  NEUTROABS 6.5  --   HGB 15.8* 12.0  HCT 44.5 35.6*  MCV 90.3 92.0  PLT 251 174    Signed:  Conswella Bruney  [facility] 11/23/2013, 11:15 AM

## 2013-11-23 NOTE — Progress Notes (Signed)
New Admission Note: (Late Entry)   Arrival Method: Via stretcher from ED with NT [employer]  Mental Orientation: Alert and Oriented X4 Telemetry: None Assessment: Completed Skin: Warm, dry and intact  IV: Clean, dry and intact. NS @100mL /hr Pain: None at this time, just very nausea (more medication given)  Tubes: None Safety Measures: Safety Fall Prevention Plan has been given, discussed and signed by patient  Admission: Completed [facility] [place] Orientation: Patient has been orientated to the room, unit and staff.  Family: Husband at bedside, not staying the night   Orders have been reviewed and implemented. Will continue to monitor the patient. Call light has been placed within reach and bed alarm has been activated.   [employer] BSN, RN  Phone number: [phone]

## 2014-02-11 ENCOUNTER — Ambulatory Visit (INDEPENDENT_AMBULATORY_CARE_PROVIDER_SITE_OTHER): Payer: Medicare Other | Admitting: Neurology

## 2014-02-11 ENCOUNTER — Encounter (INDEPENDENT_AMBULATORY_CARE_PROVIDER_SITE_OTHER): Payer: Self-pay

## 2014-02-11 ENCOUNTER — Encounter: Payer: Self-pay | Admitting: Neurology

## 2014-02-11 DIAGNOSIS — Z889 Allergy status to unspecified drugs, medicaments and biological substances status: Secondary | ICD-10-CM

## 2014-02-11 DIAGNOSIS — K5792 Diverticulitis of intestine, part unspecified, without perforation or abscess without bleeding: Secondary | ICD-10-CM

## 2014-02-11 DIAGNOSIS — I10 Essential (primary) hypertension: Secondary | ICD-10-CM

## 2014-02-11 DIAGNOSIS — Z9109 Other allergy status, other than to drugs and biological substances: Secondary | ICD-10-CM

## 2014-02-11 DIAGNOSIS — K5732 Diverticulitis of large intestine without perforation or abscess without bleeding: Secondary | ICD-10-CM

## 2014-02-11 DIAGNOSIS — E039 Hypothyroidism, unspecified: Secondary | ICD-10-CM

## 2014-02-11 DIAGNOSIS — R269 Unspecified abnormalities of gait and mobility: Secondary | ICD-10-CM

## 2014-02-11 NOTE — Progress Notes (Signed)
History: Patricia [name] is a 80 years old right-handed Caucasian female, accompanied by her husband, referred by her primary care physician Dr. Felipa Eth for evaluation of worsening gait difficulty  She had a past medical history of end-stage renal disease, etiology unknown, was on dialysis from January 2005, to November 2010, she had a kidney transplant at [facility], is currently taking cyclosporin, Imuran, she also had a history of hypertension, hyperlipidemia, osteopenia, she also has a history of left posterior fossa meningioma, status post reresection in 2002 by Dr. Nicholes Calamity  She had one seizure post recovery from her kidney transplant around November 2010, husband stated it was due to high dose of cyclosporin, she stayed at intensive care for 2 weeks, require intubation, prolonged rehabilitation, since then, she had rapid decline in her functional status, has been using  walker since then,  She had worsening gait difficulty since January 2014, require help in dressing, bathing, worsening bladder incontinence over the past 6 months as well, she complains of bilateral lower extremity weakness, right worse than left, also complained of right knee pain, swelling, there was interruption of her right leg blood circulation after her kidney transplant surgery   There was also decreased energy, she sits down watching TV or reading most of the time, she denied numbness tingling of her bilateral lower extremity, denied visual change, denied bilateral upper extremity motor or sensory deficit, she denies significant headaches, neck pain,  UPDATE 08/14/2013:  She received physical therapy, reported 30% improvement, she was able to walk with her walker at home, she can get in and out of bed, going to bathroom, she comes to kitchen table to eat, goes to sofa, read and sleep.   She uses wheelchair when coming out.  She complains of dizziness when bending over, nauseated.  She uses walker at home.  She was  recently admitted to  [facility] for food poison, due to fried oysters, with nause, vomitting.  She was found to have UTI, was treated with antibiotics.    UPDATE May 1st 2015: She is overall stable, continue to have significant gait difficulty,  We have reviewed MRI brain: mild changes of chronic microvascular ischemia generalized cerebral atrophy. Stable postoperative changes of left occipital craniectomy with encephalomalacia in the left cerebellum and calcification of the left tentorium. No significant changes compared to CT head dated 04/23/2012  MRI cervical showed moderate degenerative changes from C3-C6 with mild canal and bilateral foramina narrowing.   Review of Systems  Out of a complete 14 system review, the patient complains of only the following symptoms, and all other reviewed systems are negative.   Appetite change, and runny nose, eye itching, cough, next getting, constipation, snoring, allergy, difficulty urination, incontinence of bladder, trying pain, breathing difficulty, tremor, confusion   PHYSICAL EXAMINATOINS:  Generalized: In no acute distress  Neck: Supple, no carotid bruits   Cardiac: Regular rate rhythm  Pulmonary: Clear to auscultation bilaterally  Musculoskeletal: No deformity  Neurological examination  Mentation: Alert oriented to time, place, history taking, and causual conversation, sitting in wheelchair  Cranial nerve II-XII: Pupils were equal round reactive to light extraocular movements were full, visual field were full on confrontational test. facial sensation and strength were normal, static left ptosis to the upper edge of left pupil.  Uvula tongue midline.  head turning and shoulder shrug and were normal and symmetric.Tongue protrusion into cheek strength was normal.  Motor: right knee pain, swelling, tender on deep palpitation, moderate right hip flexion weakness, mild right knee  extension weakness, normal bilateral upper extremity  strength, bilateral ankle dorsiflexion.  Sensory: Intact to fine touch, pinprick, preserved vibratory sensation, and proprioception at toes.  Coordination: Normal finger to nose,  there was no truncal ataxia  Gait: need two people assistant to get up from seated position, cautious, dragging right leg, difficulty initiate gait    Deep tendon reflexes: Brachioradialis 3/3, biceps 3/3, triceps 3/3, patellar 1/2, Achilles 1/1, plantar responses were flexor on the left, extensor on right.  Assessment and plan: 80 years old right-handed Caucasian female, with past medical history of left posterior fossa meningioma resection, kidney transplant, chronic immunosuppressive treatment, now presenting with few years history of progressive worsening gait difficulty, getting worse over past 6 months, urinary incontinence, right knee pain, hyperreflexia on bilateral upper extremity, mild to moderate right leg weakness, which could due to right knee pain.  1.  her gait difficulty are likely due to a combination of deconditioning, right knee pain, mild cervical myelopathy  2. Continue home physical therapy, 3. Only return to clinic for new issues

## 2014-03-01 ENCOUNTER — Emergency Department (HOSPITAL_COMMUNITY): Payer: Medicare Other

## 2014-03-01 ENCOUNTER — Inpatient Hospital Stay (HOSPITAL_COMMUNITY)
Admission: EM | Admit: 2014-03-01 | Discharge: 2014-03-04 | DRG: 300 | Disposition: A | Discharge status: Home / Self Care | Payer: Medicare Other | Attending: Internal Medicine | Admitting: Internal Medicine

## 2014-03-01 ENCOUNTER — Encounter (HOSPITAL_COMMUNITY): Payer: Self-pay | Admitting: Emergency Medicine

## 2014-03-01 DIAGNOSIS — I824Y9 Acute embolism and thrombosis of unspecified deep veins of unspecified proximal lower extremity: Secondary | ICD-10-CM | POA: Diagnosis present

## 2014-03-01 DIAGNOSIS — J4489 Other specified chronic obstructive pulmonary disease: Secondary | ICD-10-CM | POA: Diagnosis present

## 2014-03-01 DIAGNOSIS — I2510 Atherosclerotic heart disease of native coronary artery without angina pectoris: Secondary | ICD-10-CM | POA: Diagnosis present

## 2014-03-01 DIAGNOSIS — Z820 Family history of epilepsy and other diseases of the nervous system: Secondary | ICD-10-CM

## 2014-03-01 DIAGNOSIS — N184 Chronic kidney disease, stage 4 (severe): Secondary | ICD-10-CM | POA: Diagnosis present

## 2014-03-01 DIAGNOSIS — N179 Acute kidney failure, unspecified: Secondary | ICD-10-CM | POA: Diagnosis present

## 2014-03-01 DIAGNOSIS — I10 Essential (primary) hypertension: Secondary | ICD-10-CM

## 2014-03-01 DIAGNOSIS — M79609 Pain in unspecified limb: Secondary | ICD-10-CM

## 2014-03-01 DIAGNOSIS — N183 Chronic kidney disease, stage 3 unspecified: Secondary | ICD-10-CM | POA: Diagnosis present

## 2014-03-01 DIAGNOSIS — N318 Other neuromuscular dysfunction of bladder: Secondary | ICD-10-CM | POA: Diagnosis present

## 2014-03-01 DIAGNOSIS — E119 Type 2 diabetes mellitus without complications: Secondary | ICD-10-CM | POA: Diagnosis present

## 2014-03-01 DIAGNOSIS — Z940 Kidney transplant status: Secondary | ICD-10-CM

## 2014-03-01 DIAGNOSIS — M7989 Other specified soft tissue disorders: Secondary | ICD-10-CM

## 2014-03-01 DIAGNOSIS — I82409 Acute embolism and thrombosis of unspecified deep veins of unspecified lower extremity: Principal | ICD-10-CM | POA: Diagnosis present

## 2014-03-01 DIAGNOSIS — I129 Hypertensive chronic kidney disease with stage 1 through stage 4 chronic kidney disease, or unspecified chronic kidney disease: Secondary | ICD-10-CM | POA: Diagnosis present

## 2014-03-01 DIAGNOSIS — J449 Chronic obstructive pulmonary disease, unspecified: Secondary | ICD-10-CM | POA: Diagnosis present

## 2014-03-01 DIAGNOSIS — Z8349 Family history of other endocrine, nutritional and metabolic diseases: Secondary | ICD-10-CM

## 2014-03-01 DIAGNOSIS — E039 Hypothyroidism, unspecified: Secondary | ICD-10-CM | POA: Diagnosis present

## 2014-03-01 HISTORY — DX: Acute embolism and thrombosis of unspecified deep veins of unspecified lower extremity: I82.409

## 2014-03-01 LAB — COMPREHENSIVE METABOLIC PANEL
ALK PHOS: 76 U/L (ref 39–117)
ALT: 9 U/L (ref 0–35)
AST: 17 U/L (ref 0–37)
Albumin: 3.4 g/dL — ABNORMAL LOW (ref 3.5–5.2)
BUN: 27 mg/dL — ABNORMAL HIGH (ref 6–23)
CO2: 22 meq/L (ref 19–32)
Calcium: 10.6 mg/dL — ABNORMAL HIGH (ref 8.4–10.5)
Chloride: 104 mEq/L (ref 96–112)
Creatinine, Ser: 1.52 mg/dL — ABNORMAL HIGH (ref 0.50–1.10)
GFR, EST AFRICAN AMERICAN: 36 mL/min — AB (ref >90)
GFR, EST NON AFRICAN AMERICAN: 31 mL/min — AB (ref >90)
GLUCOSE: 133 mg/dL — AB (ref 70–99)
POTASSIUM: 4.1 meq/L (ref 3.7–5.3)
SODIUM: 141 meq/L (ref 137–147)
Total Bilirubin: 0.4 mg/dL (ref 0.3–1.2)
Total Protein: 7.7 g/dL (ref 6.0–8.3)

## 2014-03-01 LAB — CBC
HCT: 37.2 % (ref 36.0–46.0)
Hemoglobin: 12 g/dL (ref 12.0–15.0)
MCH: 30.2 pg (ref 26.0–34.0)
MCHC: 32.3 g/dL (ref 30.0–36.0)
MCV: 93.7 fL (ref 78.0–100.0)
Platelets: 224 10*3/uL (ref 150–400)
RBC: 3.97 MIL/uL (ref 3.87–5.11)
RDW: 15.2 % (ref 11.5–15.5)
WBC: 9.8 10*3/uL (ref 4.0–10.5)

## 2014-03-01 LAB — PROTIME-INR
INR: 1.04 (ref 0.00–1.49)
Prothrombin Time: 13.4 seconds (ref 11.6–15.2)

## 2014-03-01 LAB — GLUCOSE, CAPILLARY: Glucose-Capillary: 142 mg/dL — ABNORMAL HIGH (ref 70–99)

## 2014-03-01 LAB — CBG MONITORING, ED: Glucose-Capillary: 128 mg/dL — ABNORMAL HIGH (ref 70–99)

## 2014-03-01 MED ORDER — LEVOTHYROXINE SODIUM 75 MCG PO TABS
75.0000 ug | ORAL_TABLET | Freq: Every day | ORAL | Status: DC
  Administered 2014-03-02 – 2014-03-04 (×3): 75 ug via ORAL
  Filled 2014-03-01 (×4): qty 1

## 2014-03-01 MED ORDER — LORATADINE 10 MG PO TABS
10.0000 mg | ORAL_TABLET | Freq: Every day | ORAL | Status: DC
  Administered 2014-03-02 – 2014-03-04 (×3): 10 mg via ORAL
  Filled 2014-03-01 (×3): qty 1

## 2014-03-01 MED ORDER — INSULIN ASPART 100 UNIT/ML ~~LOC~~ SOLN
0.0000 [IU] | Freq: Three times a day (TID) | SUBCUTANEOUS | Status: DC
Start: 2014-03-01 — End: 2014-03-04
  Administered 2014-03-02: 2 [IU] via SUBCUTANEOUS
  Administered 2014-03-04: 1 [IU] via SUBCUTANEOUS
  Administered 2014-03-04: 3 [IU] via SUBCUTANEOUS
  Filled 2014-03-01: qty 1

## 2014-03-01 MED ORDER — FLUOXETINE HCL 20 MG PO TABS
20.0000 mg | ORAL_TABLET | Freq: Every day | ORAL | Status: DC
  Administered 2014-03-02: 20 mg via ORAL
  Filled 2014-03-01 (×2): qty 1

## 2014-03-01 MED ORDER — PANTOPRAZOLE SODIUM 40 MG PO TBEC
40.0000 mg | DELAYED_RELEASE_TABLET | Freq: Every day | ORAL | Status: DC
  Administered 2014-03-02 – 2014-03-04 (×3): 40 mg via ORAL
  Filled 2014-03-01 (×2): qty 1

## 2014-03-01 MED ORDER — METOPROLOL SUCCINATE ER 100 MG PO TB24
100.0000 mg | ORAL_TABLET | Freq: Every day | ORAL | Status: DC
  Administered 2014-03-02 – 2014-03-04 (×3): 100 mg via ORAL
  Filled 2014-03-01 (×3): qty 1

## 2014-03-01 MED ORDER — BUDESONIDE-FORMOTEROL FUMARATE 160-4.5 MCG/ACT IN AERO
2.0000 | INHALATION_SPRAY | Freq: Two times a day (BID) | RESPIRATORY_TRACT | Status: DC
  Administered 2014-03-02 – 2014-03-04 (×5): 2 via RESPIRATORY_TRACT
  Filled 2014-03-01: qty 6

## 2014-03-01 MED ORDER — ALBUTEROL SULFATE (2.5 MG/3ML) 0.083% IN NEBU: 2.5000 mg | INHALATION_SOLUTION | Freq: Four times a day (QID) | RESPIRATORY_TRACT | Status: DC | PRN

## 2014-03-01 MED ORDER — SODIUM CHLORIDE 0.9 % IV SOLN
INTRAVENOUS | Status: DC
  Administered 2014-03-01: 23:00:00 via INTRAVENOUS

## 2014-03-01 MED ORDER — DARIFENACIN HYDROBROMIDE ER 7.5 MG PO TB24
7.5000 mg | ORAL_TABLET | Freq: Every morning | ORAL | Status: DC
  Administered 2014-03-02 – 2014-03-04 (×3): 7.5 mg via ORAL
  Filled 2014-03-01 (×3): qty 1

## 2014-03-01 MED ORDER — COLESEVELAM HCL 625 MG PO TABS
1875.0000 mg | ORAL_TABLET | Freq: Two times a day (BID) | ORAL | Status: DC
  Administered 2014-03-02 – 2014-03-04 (×5): 1875 mg via ORAL
  Filled 2014-03-01 (×7): qty 3

## 2014-03-01 MED ORDER — ACETAMINOPHEN 650 MG RE SUPP: 650.0000 mg | Freq: Four times a day (QID) | RECTAL | Status: DC | PRN

## 2014-03-01 MED ORDER — HEPARIN (PORCINE) IN NACL 100-0.45 UNIT/ML-% IJ SOLN
1250.0000 [IU]/h | INTRAMUSCULAR | Status: DC
  Administered 2014-03-01 – 2014-03-02 (×2): 1250 [IU]/h via INTRAVENOUS
  Filled 2014-03-01: qty 250

## 2014-03-01 MED ORDER — ALBUTEROL SULFATE HFA 108 (90 BASE) MCG/ACT IN AERS: 2.0000 | INHALATION_SPRAY | Freq: Four times a day (QID) | RESPIRATORY_TRACT | Status: DC | PRN

## 2014-03-01 MED ORDER — CYCLOSPORINE 100 MG PO CAPS
100.0000 mg | ORAL_CAPSULE | Freq: Two times a day (BID) | ORAL | Status: DC
  Administered 2014-03-02 – 2014-03-04 (×6): 100 mg via ORAL
  Filled 2014-03-01 (×8): qty 1

## 2014-03-01 MED ORDER — HEPARIN (PORCINE) IN NACL 100-0.45 UNIT/ML-% IJ SOLN: 16.0000 [IU]/kg/h | INTRAMUSCULAR | Status: DC

## 2014-03-01 MED ORDER — HEPARIN BOLUS VIA INFUSION
4000.0000 [IU] | Freq: Once | INTRAVENOUS | Status: AC
  Administered 2014-03-01: 4000 [IU] via INTRAVENOUS
  Filled 2014-03-01: qty 4000

## 2014-03-01 MED ORDER — FLUTICASONE PROPIONATE 50 MCG/ACT NA SUSP
1.0000 | Freq: Every day | NASAL | Status: DC | PRN
  Filled 2014-03-01: qty 16

## 2014-03-01 MED ORDER — AZATHIOPRINE 50 MG PO TABS
50.0000 mg | ORAL_TABLET | Freq: Two times a day (BID) | ORAL | Status: DC
  Administered 2014-03-02 – 2014-03-04 (×6): 50 mg via ORAL
  Filled 2014-03-01 (×7): qty 1

## 2014-03-01 MED ORDER — INSULIN ASPART 100 UNIT/ML ~~LOC~~ SOLN
0.0000 [IU] | Freq: Every day | SUBCUTANEOUS | Status: DC
Start: 2014-03-01 — End: 2014-03-04

## 2014-03-01 MED ORDER — MIRABEGRON ER 25 MG PO TB24
25.0000 mg | ORAL_TABLET | Freq: Every evening | ORAL | Status: DC
  Administered 2014-03-02 – 2014-03-03 (×3): 25 mg via ORAL
  Filled 2014-03-01 (×4): qty 1

## 2014-03-01 MED ORDER — ONDANSETRON HCL 4 MG PO TABS
4.0000 mg | ORAL_TABLET | Freq: Three times a day (TID) | ORAL | Status: DC | PRN
  Administered 2014-03-02: 4 mg via ORAL
  Filled 2014-03-01: qty 1

## 2014-03-01 MED ORDER — SULFAMETHOXAZOLE-TRIMETHOPRIM 400-80 MG PO TABS
1.0000 | ORAL_TABLET | ORAL | Status: DC
  Administered 2014-03-03: 1 via ORAL
  Filled 2014-03-01 (×2): qty 1

## 2014-03-01 MED ORDER — AMLODIPINE BESYLATE 5 MG PO TABS
5.0000 mg | ORAL_TABLET | Freq: Every day | ORAL | Status: DC
  Administered 2014-03-02 – 2014-03-04 (×3): 5 mg via ORAL
  Filled 2014-03-01 (×3): qty 1

## 2014-03-01 MED ORDER — SODIUM CHLORIDE 0.9 % IV BOLUS (SEPSIS)
500.0000 mL | Freq: Once | INTRAVENOUS | Status: AC
  Administered 2014-03-01: 500 mL via INTRAVENOUS

## 2014-03-01 MED ORDER — CLONIDINE HCL 0.1 MG PO TABS
0.1000 mg | ORAL_TABLET | Freq: Two times a day (BID) | ORAL | Status: DC
  Administered 2014-03-02 – 2014-03-04 (×6): 0.1 mg via ORAL
  Filled 2014-03-01 (×8): qty 1

## 2014-03-01 MED ORDER — PREDNISONE 5 MG PO TABS
5.0000 mg | ORAL_TABLET | Freq: Every day | ORAL | Status: DC
  Administered 2014-03-02 – 2014-03-04 (×3): 5 mg via ORAL
  Filled 2014-03-01 (×4): qty 1

## 2014-03-01 MED ORDER — ACETAMINOPHEN 325 MG PO TABS
650.0000 mg | ORAL_TABLET | Freq: Four times a day (QID) | ORAL | Status: DC | PRN
  Administered 2014-03-02: 650 mg via ORAL
  Filled 2014-03-01: qty 2

## 2014-03-01 NOTE — ED Notes (Signed)
Meal tray ordered 

## 2014-03-01 NOTE — H&P (Signed)
[facility] History and Physical  [name] [id number] DOB: 11/14/1931 DOA: 03/01/2014  Referring physician: er PCP: [name], MD   Chief Complaint: leg swelling  HPI: Patricia [name] is a 80 y.o. female  with hx of DVT, kidney transplant, CAD, HTN presents with L leg pain.  Swelling started on Saturday.   She has a Hx of DVT before in R leg, was on oral anti-coagulation for that but is not currently taking.    Her swelling is in her L thigh and calf with mild color change. L foot pulse present. + edema in ankle, remainder of leg with moderate edema that is not pitting.   In the ER a LE duplex was done that showed diffuse L upper leg DVT. Labs showed a mild decrease in renal function with increase in creatinine and dip in GFR.  ER MD spoke with Dr. Vernard Gambles with IR who stated she would be a candidate for IV-guided lysis, however her renal function is not ideal.       Review of Systems:  All systems reviewed negative unless stated above    Past Medical History  Diagnosis Date  . Renal disorder   . Hx of kidney transplant     right  . Coronary artery disease   . Hypertension   . Thyroid disease   . Multiple allergies   . Abnormal gait   . Reflux   . Diverticulitis   . Hypothyroidism   . Depression   . Anxiety   . COPD, mild   . Dizziness   . Diabetes   . DVT (deep venous thrombosis)    Past Surgical History  Procedure Laterality Date  . Nephrectomy transplanted organ    . Tumor removal      Brain    Social History:  reports that she has never smoked. She has never used smokeless tobacco. She reports that she does not drink alcohol or use illicit drugs.  Allergies  Allergen Reactions  . Aricept [Donepezil Hcl] Nausea And Vomiting  . Codeine Nausea And Vomiting  . Penicillins Other (See Comments)    Doesn't remember  . Statins Other (See Comments)    Doesn't remember  . Streptomycin Other (See Comments)    unknown  . Tetracyclines &  Related Rash    Family History  Problem Relation Age of Onset  . Alzheimer's disease Mother   . Alcoholism Father   . High Cholesterol Mother      Prior to Admission medications   Medication Sig Start Date End Date Taking? Authorizing Provider  albuterol (PROVENTIL HFA;VENTOLIN HFA) 108 (90 BASE) MCG/ACT inhaler Inhale 2 puffs into the lungs every 6 (six) hours as needed for wheezing or shortness of breath. Patient uses this medication prn.   Yes Historical Provider, MD  amLODipine (NORVASC) 5 MG tablet Take 5 mg by mouth daily. 06/25/13  Yes Historical Provider, MD  azaTHIOprine (IMURAN) 50 MG tablet Take 50 mg by mouth 2 (two) times daily.    Yes Historical Provider, MD  budesonide-formoterol (SYMBICORT) 160-4.5 MCG/ACT inhaler Inhale 2 puffs into the lungs 2 (two) times daily.   Yes Historical Provider, MD  cetirizine (ZYRTEC) 10 MG tablet Take 10 mg by mouth every morning.    Yes Historical Provider, MD  Cholecalciferol (VITAMIN D) 2000 UNITS tablet Take 2,000 Units by mouth every evening.    Yes Historical Provider, MD  cloNIDine (CATAPRES) 0.1 MG tablet Take 0.1 mg by mouth 2 (two) times daily.  Yes Historical Provider, MD  Coenzyme Q10 (CO Q 10) 100 MG CAPS Take 1 tablet by mouth every evening.    Yes Historical Provider, MD  colesevelam (WELCHOL) 625 MG tablet Take 1,875 mg by mouth 2 (two) times daily with a meal.   Yes Historical Provider, MD  cycloSPORINE (SANDIMMUNE) 100 MG capsule Take 100 mg by mouth 2 (two) times daily.   Yes Historical Provider, MD  ENABLEX 7.5 MG 24 hr tablet Take 7.5 mg by mouth every morning.  06/16/13  Yes Historical Provider, MD  fenofibrate (TRICOR) 48 MG tablet Take 48 mg by mouth daily.   Yes Historical Provider, MD  FLUoxetine (PROZAC) 20 MG tablet Take 20 mg by mouth daily.   Yes Historical Provider, MD  fluticasone (FLONASE) 50 MCG/ACT nasal spray Place 1 spray into both nostrils daily as needed for allergies or rhinitis.   Yes Historical Provider,  MD  furosemide (LASIX) 40 MG tablet Take 40 mg by mouth 2 (two) times daily.    Yes Historical Provider, MD  hyoscyamine (ANASPAZ) 0.125 MG TBDP Place 0.125 mg under the tongue every 6 (six) hours as needed for bladder spasms. Patient uses this medication prn.   Yes Historical Provider, MD  L-Lysine 500 MG TABS Take 500 mg by mouth daily.   Yes Historical Provider, MD  levothyroxine (SYNTHROID, LEVOTHROID) 75 MCG tablet Take 75 mcg by mouth daily.   Yes Historical Provider, MD  metoprolol succinate (TOPROL-XL) 100 MG 24 hr tablet Take 100 mg by mouth daily. Take with or immediately following a meal.   Yes Historical Provider, MD  mirabegron ER (MYRBETRIQ) 25 MG TB24 tablet Take 25 mg by mouth every evening.    Yes Historical Provider, MD  Multiple Vitamin (MULTIVITAMIN) tablet Take 1 tablet by mouth daily.   Yes Historical Provider, MD  omeprazole (PRILOSEC) 20 MG capsule Take 20 mg by mouth daily as needed (for heartburn).   Yes Historical Provider, MD  ondansetron (ZOFRAN) 4 MG tablet Take 4 mg by mouth every 8 (eight) hours as needed for nausea or vomiting.   Yes Historical Provider, MD  POTASSIUM CHLORIDE PO Take 300 mcg by mouth every evening.    Yes Historical Provider, MD  predniSONE (DELTASONE) 5 MG tablet Take 5 mg by mouth daily with breakfast.   Yes Historical Provider, MD  sulfamethoxazole-trimethoprim (BACTRIM,SEPTRA) 400-80 MG per tablet Take 1 tablet by mouth 3 (three) times a week. Monday, Wednesday, and Friday.   Yes Historical Provider, MD   Physical Exam: Filed Vitals:   03/01/14 1552  BP: 124/58  Pulse: 57  Temp: 97.4 F (36.3 C)  Resp: 18    BP 124/58  Pulse 57  Temp(Src) 97.4 F (36.3 C) (Oral)  Resp 18  Ht 5' 4.96" (1.65 m)  Wt 76.2 kg (167 lb 15.9 oz)  BMI 27.99 kg/m2  SpO2 96%  General:  Appears calm and comfortable Eyes: PERRL, normal lids, irises & conjunctiva ENT: grossly normal hearing, lips & tongue Neck: no LAD, masses or  thyromegaly Cardiovascular: RRR, no m/r/g. No LE edema. Telemetry: SR, no arrhythmias  Respiratory: CTA bilaterally, no w/r/r. Normal respiratory effort. Abdomen: soft, ntnd Skin: no rash or induration seen on limited exam- patient is in hallway Musculoskeletal: left leg swelling with pitting edema, pulses intact Psychiatric: grossly normal mood and affect, speech fluent and appropriate Neurologic: grossly non-focal.          Labs on Admission:  Basic Metabolic Panel:  Recent Labs Lab 03/01/14 1239  NA 141  K 4.1  CL 104  CO2 22  GLUCOSE 133*  BUN 27*  CREATININE 1.52*  CALCIUM 10.6*   Liver Function Tests:  Recent Labs Lab 03/01/14 1239  AST 17  ALT 9  ALKPHOS 76  BILITOT 0.4  PROT 7.7  ALBUMIN 3.4*   No results found for this basename: LIPASE, AMYLASE,  in the last 168 hours No results found for this basename: AMMONIA,  in the last 168 hours CBC:  Recent Labs Lab 03/01/14 1239  WBC 9.8  HGB 12.0  HCT 37.2  MCV 93.7  PLT 224   Cardiac Enzymes: No results found for this basename: CKTOTAL, CKMB, CKMBINDEX, TROPONINI,  in the last 168 hours  BNP (last 3 results) No results found for this basename: PROBNP,  in the last 8760 hours CBG: No results found for this basename: GLUCAP,  in the last 168 hours  Radiological Exams on Admission: Dg Chest 2 View  03/01/2014   CLINICAL DATA:  Leg pain and swelling  EXAM: CHEST  2 VIEW  COMPARISON:  04/23/2012 CT chest, chest x-ray 04/23/2012  FINDINGS: The heart size and mediastinal contours are within normal limits. There is stable lingular scarring. No focal consolidation, pleural effusion or pneumothorax. Calcified left upper lobe pulmonary nodule likely representing sequela prior granulomatous disease. The visualized skeletal structures are unremarkable.  IMPRESSION: No active cardiopulmonary disease.   Electronically Signed   By: Kathreen Devoid   On: 03/01/2014 13:12      Assessment/Plan Active Problems:    History of renal transplant   Diabetes   DVT (deep venous thrombosis)   AKI (acute kidney injury)   DVT- extensive clot- heparin- ? If patient is a candidate for lysis- IR to see in AM  H/o renal transplant with AKI- IVF and recheck in AM, nephro consult in AM if not better, continue home anti-rejection meds  DM- SSI  HTN- resume home meds   IR- will see in AM  Code Status: full Family Communication: patient/husband at bedside Disposition Plan: admit  Time spent: 75 min  [facility] Pager [phone]

## 2014-03-01 NOTE — ED Notes (Signed)
Left leg pain  Back of knee pain and swelling since sat she states

## 2014-03-01 NOTE — ED Notes (Signed)
Dr Stark Jock is aware of pt [place] results

## 2014-03-01 NOTE — ED Provider Notes (Signed)
CSN: [id number]     Arrival date & time 03/01/14  1207 History   First MD Initiated Contact with Patient 03/01/14 1459     Chief Complaint  Patient presents with  . Leg Pain     (Consider location/radiation/quality/duration/timing/severity/associated sxs/prior Treatment) Patient is a 80 y.o. female presenting with leg pain. The history is provided by the patient.  Leg Pain Location:  Leg Time since incident:  2 days Injury: no   Leg location:  L leg, L upper leg and L lower leg Pain details:    Quality:  Aching   Severity:  Moderate   Onset quality:  Gradual   Timing:  Constant   Progression:  Worsening Chronicity:  New Dislocation: no   Foreign body present:  No foreign bodies Prior injury to area:  No Relieved by:  Nothing Worsened by:  Nothing tried Associated symptoms: no fever     Past Medical History  Diagnosis Date  . Renal disorder   . Hx of kidney transplant     right  . Coronary artery disease   . Hypertension   . Thyroid disease   . Multiple allergies   . Abnormal gait   . Reflux   . Diverticulitis   . Hypothyroidism   . Depression   . Anxiety   . COPD, mild   . Dizziness   . Diabetes   . DVT (deep venous thrombosis)    Past Surgical History  Procedure Laterality Date  . Nephrectomy transplanted organ    . Tumor removal      Brain    Family History  Problem Relation Age of Onset  . Alzheimer's disease Mother   . Alcoholism Father   . High Cholesterol Mother    History  Substance Use Topics  . Smoking status: Never Smoker   . Smokeless tobacco: Never Used  . Alcohol Use: No   OB History   Grav Para Term Preterm Abortions TAB SAB Ect Mult Living                 Review of Systems  Constitutional: Negative for fever and chills.  Respiratory: Negative for cough and shortness of breath.   Cardiovascular: Negative for chest pain and leg swelling.  Gastrointestinal: Negative for vomiting and abdominal pain.  All other systems reviewed  and are negative.     Allergies  Aricept; Codeine; Penicillins; Statins; Streptomycin; and Tetracyclines & related  Home Medications   Prior to Admission medications   Medication Sig Start Date End Date Taking? Authorizing Provider  albuterol (PROVENTIL HFA;VENTOLIN HFA) 108 (90 BASE) MCG/ACT inhaler Inhale 2 puffs into the lungs every 6 (six) hours as needed for wheezing or shortness of breath. Patient uses this medication prn.   Yes Historical Provider, MD  amLODipine (NORVASC) 5 MG tablet Take 5 mg by mouth daily. 06/25/13  Yes Historical Provider, MD  azaTHIOprine (IMURAN) 50 MG tablet Take 50 mg by mouth 2 (two) times daily.    Yes Historical Provider, MD  budesonide-formoterol (SYMBICORT) 160-4.5 MCG/ACT inhaler Inhale 2 puffs into the lungs 2 (two) times daily.   Yes Historical Provider, MD  cetirizine (ZYRTEC) 10 MG tablet Take 10 mg by mouth every morning.    Yes Historical Provider, MD  Cholecalciferol (VITAMIN D) 2000 UNITS tablet Take 2,000 Units by mouth every evening.    Yes Historical Provider, MD  cloNIDine (CATAPRES) 0.1 MG tablet Take 0.1 mg by mouth 2 (two) times daily.   Yes Historical Provider, MD  Coenzyme Q10 (CO Q 10) 100 MG CAPS Take 1 tablet by mouth every evening.    Yes Historical Provider, MD  colesevelam (WELCHOL) 625 MG tablet Take 1,875 mg by mouth 2 (two) times daily with a meal.   Yes Historical Provider, MD  cycloSPORINE (SANDIMMUNE) 100 MG capsule Take 100 mg by mouth 2 (two) times daily.   Yes Historical Provider, MD  ENABLEX 7.5 MG 24 hr tablet Take 7.5 mg by mouth every morning.  06/16/13  Yes Historical Provider, MD  fenofibrate (TRICOR) 48 MG tablet Take 48 mg by mouth daily.   Yes Historical Provider, MD  FLUoxetine (PROZAC) 20 MG tablet Take 20 mg by mouth daily.   Yes Historical Provider, MD  fluticasone (FLONASE) 50 MCG/ACT nasal spray Place 1 spray into both nostrils daily as needed for allergies or rhinitis.   Yes Historical Provider, MD   furosemide (LASIX) 40 MG tablet Take 40 mg by mouth 2 (two) times daily.    Yes Historical Provider, MD  hyoscyamine (ANASPAZ) 0.125 MG TBDP Place 0.125 mg under the tongue every 6 (six) hours as needed for bladder spasms. Patient uses this medication prn.   Yes Historical Provider, MD  L-Lysine 500 MG TABS Take 500 mg by mouth daily.   Yes Historical Provider, MD  levothyroxine (SYNTHROID, LEVOTHROID) 75 MCG tablet Take 75 mcg by mouth daily.   Yes Historical Provider, MD  metoprolol succinate (TOPROL-XL) 100 MG 24 hr tablet Take 100 mg by mouth daily. Take with or immediately following a meal.   Yes Historical Provider, MD  mirabegron ER (MYRBETRIQ) 25 MG TB24 tablet Take 25 mg by mouth every evening.    Yes Historical Provider, MD  Multiple Vitamin (MULTIVITAMIN) tablet Take 1 tablet by mouth daily.   Yes Historical Provider, MD  omeprazole (PRILOSEC) 20 MG capsule Take 20 mg by mouth daily as needed (for heartburn).   Yes Historical Provider, MD  ondansetron (ZOFRAN) 4 MG tablet Take 4 mg by mouth every 8 (eight) hours as needed for nausea or vomiting.   Yes Historical Provider, MD  POTASSIUM CHLORIDE PO Take 300 mcg by mouth every evening.    Yes Historical Provider, MD  predniSONE (DELTASONE) 5 MG tablet Take 5 mg by mouth daily with breakfast.   Yes Historical Provider, MD  sulfamethoxazole-trimethoprim (BACTRIM,SEPTRA) 400-80 MG per tablet Take 1 tablet by mouth 3 (three) times a week. Monday, Wednesday, and Friday.   Yes Historical Provider, MD   BP 125/51  Pulse 63  Temp(Src) 97.8 F (36.6 C) (Oral)  Resp 18  SpO2 97% Physical Exam  Nursing note and vitals reviewed. Constitutional: She is oriented to person, place, and time. She appears well-developed and well-nourished. No distress.  HENT:  Head: Normocephalic and atraumatic.  Eyes: EOM are normal. Pupils are equal, round, and reactive to light.  Neck: Normal range of motion. Neck supple.  Cardiovascular: Normal rate and  regular rhythm.  Exam reveals no friction rub.   No murmur heard. Pulmonary/Chest: Effort normal and breath sounds normal. No respiratory distress. She has no wheezes. She has no rales.  Abdominal: Soft. She exhibits no distension. There is no tenderness. There is no rebound.  Musculoskeletal: Normal range of motion. She exhibits edema (L thigh, L leg. L ankle - pitting edema).  Neurological: She is alert and oriented to person, place, and time. No cranial nerve deficit. She exhibits normal muscle tone. Coordination normal.  Skin: She is not diaphoretic.    ED Course  Procedures (  including critical care time) Labs Review Labs Reviewed  COMPREHENSIVE METABOLIC PANEL - Abnormal; Notable for the following:    Glucose, Bld 133 (*)    BUN 27 (*)    Creatinine, Ser 1.52 (*)    Calcium 10.6 (*)    Albumin 3.4 (*)    GFR calc non Af Amer 31 (*)    GFR calc Af Amer 36 (*)    All other components within normal limits  CBC  PROTIME-INR    Imaging Review Dg Chest 2 View  03/01/2014   CLINICAL DATA:  Leg pain and swelling  EXAM: CHEST  2 VIEW  COMPARISON:  04/23/2012 CT chest, chest x-ray 04/23/2012  FINDINGS: The heart size and mediastinal contours are within normal limits. There is stable lingular scarring. No focal consolidation, pleural effusion or pneumothorax. Calcified left upper lobe pulmonary nodule likely representing sequela prior granulomatous disease. The visualized skeletal structures are unremarkable.  IMPRESSION: No active cardiopulmonary disease.   Electronically Signed   By: Kathreen Devoid   On: 03/01/2014 13:12     EKG Interpretation None      MDM   Final diagnoses:  COPD, mild  AKI (acute kidney injury)  Diabetes  DVT (deep venous thrombosis)  History of renal transplant    76F with hx of DVT, kidney transplant, CAD, HTN presents with L leg pain. Present for past 2 days. Associated L leg swelling, tightness, color change. Hx of DVT before in R leg, was on oral  anti-coagulation for that.  2 days of L leg pain, swelling. On exam has L thigh and calf swelling with mild color change. L foot pulse present. Pitting edema in ankle, remainder of leg with moderate edema that is not pitting. DVT study shows diffuse L upper leg DVT. Labs show mild decrease in renal function with increase in creatinine and dip in GFR. Based on exam with color change, leg swelling, concern for need for intervention. I spoke with Dr. Vernard Gambles with IR who stated she would be a candidate for IV-guided lysis, however her renal function is not ideal. Will ask medicine to admit to hydrate and monitor creatinine. IR will put patient on their rounding list tomorrow.  Patient admitted to Dr. Eliseo Squires with internal medicine.  Osvaldo Shipper, MD 03/01/14 [phone]

## 2014-03-01 NOTE — Consult Note (Signed)
ANTICOAGULATION CONSULT NOTE - Initial Consult  Pharmacy Consult for Heparin Indication: DVT  Allergies  Allergen Reactions  . Aricept [Donepezil Hcl] Nausea And Vomiting  . Codeine Nausea And Vomiting  . Penicillins Other (See Comments)    Doesn't remember  . Statins Other (See Comments)    Doesn't remember  . Streptomycin Other (See Comments)    unknown  . Tetracyclines & Related Rash    Patient Measurements: Height: 5' 4.96" (165 cm) Weight: 167 lb 15.9 oz (76.2 kg) IBW/kg (Calculated) : 56.91 Heparin Dosing Weight: ~72kg  Vital Signs: Temp: 97.4 F (36.3 C) (05/18 1552) Temp src: Oral (05/18 1552) BP: 124/58 mmHg (05/18 1552) Pulse Rate: 57 (05/18 1552)  Labs:  Recent Labs  03/01/14 1239  HGB 12.0  HCT 37.2  PLT 224  LABPROT 13.4  INR 1.04  CREATININE 1.52*    Estimated Creatinine Clearance: 30.6 ml/min (by C-G formula based on Cr of 1.52).   Medical History: Past Medical History  Diagnosis Date  . Renal disorder   . Hx of kidney transplant     right  . Coronary artery disease   . Hypertension   . Thyroid disease   . Multiple allergies   . Abnormal gait   . Reflux   . Diverticulitis   . Hypothyroidism   . Depression   . Anxiety   . COPD, mild   . Dizziness   . Diabetes   . DVT (deep venous thrombosis)     Medications:  No anticoagulants pta  Assessment: Patricia [name] presents to the ED with left leg pain. LE dopplers positive for left lower extremity DVT noted in the saphenofemoral, common femoral, profunda, and femoral veins. She will begin IV heparin. Baseline CBC wnl. She is s/p kidney transplant in 2010 and does have renal insufficiency with sCr elevated at 1.5.  Goal of Therapy:  Heparin level 0.3-0.7 units/ml Monitor platelets by anticoagulation protocol: Yes   Plan:  1) Heparin bolus 4000 units x 1 2) Heparin drip at 1250 units/hr 3) Check 8 hour heparin level 4) Daily heparin level and CBC  Benjamine Sprague Rashanda Magloire 03/01/2014,4:19  PM

## 2014-03-01 NOTE — Progress Notes (Addendum)
Bilateral lower extremity venous duplex completed.  Right:  No evidence of DVT, superficial thrombosis, or Baker's cyst.  Left: DVT noted in the saphenofemoral, common femoral, profunda, and femoral veins.  There appears to be superficial thrombosis in the left greater saphenous vein.  No Baker's cyst.

## 2014-03-01 NOTE — ED Notes (Signed)
Pt refusing insulin.  sts she controls diabetes with diet at home. Dr. Eliseo Squires made aware.  Stated it was ok for pt to refuse insulin.  Would like updates if pt's blood sugars become elevated in the 200's.

## 2014-03-02 ENCOUNTER — Encounter (HOSPITAL_COMMUNITY): Payer: Self-pay

## 2014-03-02 DIAGNOSIS — N183 Chronic kidney disease, stage 3 unspecified: Secondary | ICD-10-CM

## 2014-03-02 DIAGNOSIS — J449 Chronic obstructive pulmonary disease, unspecified: Secondary | ICD-10-CM

## 2014-03-02 DIAGNOSIS — I824Y9 Acute embolism and thrombosis of unspecified deep veins of unspecified proximal lower extremity: Secondary | ICD-10-CM | POA: Diagnosis present

## 2014-03-02 DIAGNOSIS — I10 Essential (primary) hypertension: Secondary | ICD-10-CM | POA: Diagnosis present

## 2014-03-02 DIAGNOSIS — J4489 Other specified chronic obstructive pulmonary disease: Secondary | ICD-10-CM

## 2014-03-02 DIAGNOSIS — N184 Chronic kidney disease, stage 4 (severe): Secondary | ICD-10-CM | POA: Diagnosis present

## 2014-03-02 DIAGNOSIS — I825Z9 Chronic embolism and thrombosis of unspecified deep veins of unspecified distal lower extremity: Secondary | ICD-10-CM

## 2014-03-02 LAB — CBC
HEMATOCRIT: 32.2 % — AB (ref 36.0–46.0)
Hemoglobin: 10.4 g/dL — ABNORMAL LOW (ref 12.0–15.0)
MCH: 30.1 pg (ref 26.0–34.0)
MCHC: 32.3 g/dL (ref 30.0–36.0)
MCV: 93.1 fL (ref 78.0–100.0)
Platelets: 200 10*3/uL (ref 150–400)
RBC: 3.46 MIL/uL — ABNORMAL LOW (ref 3.87–5.11)
RDW: 14.9 % (ref 11.5–15.5)
WBC: 8.4 10*3/uL (ref 4.0–10.5)

## 2014-03-02 LAB — HEPARIN LEVEL (UNFRACTIONATED)
Heparin Unfractionated: 0.62 IU/mL (ref 0.30–0.70)
Heparin Unfractionated: 0.66 IU/mL (ref 0.30–0.70)

## 2014-03-02 LAB — BASIC METABOLIC PANEL
BUN: 24 mg/dL — ABNORMAL HIGH (ref 6–23)
CO2: 21 mEq/L (ref 19–32)
Calcium: 9.7 mg/dL (ref 8.4–10.5)
Chloride: 106 mEq/L (ref 96–112)
Creatinine, Ser: 1.28 mg/dL — ABNORMAL HIGH (ref 0.50–1.10)
GFR calc Af Amer: 45 mL/min — ABNORMAL LOW (ref >90)
GFR calc non Af Amer: 39 mL/min — ABNORMAL LOW (ref >90)
GLUCOSE: 107 mg/dL — AB (ref 70–99)
POTASSIUM: 3.9 meq/L (ref 3.7–5.3)
SODIUM: 141 meq/L (ref 137–147)

## 2014-03-02 LAB — GLUCOSE, CAPILLARY
GLUCOSE-CAPILLARY: 170 mg/dL — AB (ref 70–99)
Glucose-Capillary: 103 mg/dL — ABNORMAL HIGH (ref 70–99)
Glucose-Capillary: 180 mg/dL — ABNORMAL HIGH (ref 70–99)
Glucose-Capillary: 129 mg/dL — ABNORMAL HIGH (ref 70–99)

## 2014-03-02 LAB — HEMOGLOBIN A1C
HEMOGLOBIN A1C: 6.2 % — AB (ref <5.7)
MEAN PLASMA GLUCOSE: 131 mg/dL — AB (ref <117)

## 2014-03-02 MED ORDER — WHITE PETROLATUM GEL
Status: AC
  Administered 2014-03-02: 14:00:00
  Filled 2014-03-02: qty 5

## 2014-03-02 MED ORDER — HEPARIN (PORCINE) IN NACL 100-0.45 UNIT/ML-% IJ SOLN
1200.0000 [IU]/h | INTRAMUSCULAR | Status: DC
  Administered 2014-03-02: 1200 [IU]/h via INTRAVENOUS
  Filled 2014-03-02: qty 250

## 2014-03-02 MED ORDER — DOCUSATE SODIUM 100 MG PO CAPS
100.0000 mg | ORAL_CAPSULE | Freq: Two times a day (BID) | ORAL | Status: DC
  Administered 2014-03-02 – 2014-03-04 (×5): 100 mg via ORAL
  Filled 2014-03-02 (×7): qty 1

## 2014-03-02 MED ORDER — SENNA 8.6 MG PO TABS
2.0000 | ORAL_TABLET | Freq: Every day | ORAL | Status: DC
  Administered 2014-03-02 – 2014-03-04 (×3): 17.2 mg via ORAL
  Filled 2014-03-02 (×3): qty 2

## 2014-03-02 NOTE — Consult Note (Signed)
[facility] [place]  Patient name: Patricia [name] MRN: [id number] DOB: 02/19/1932 Sex: female  REASON FOR CONSULT: Left lower extremity DVT. Consult is from [facility].  HPI: Patricia [name] is a 80 y.o. female with a history of a right lower extremity DVT involving the popliteal vein approximately 4 years ago. She developed swelling intermittently in her left leg last Saturday, 3 days ago. She presented to her orthopedic doctor Monday and was sent to the emergency department for a venous duplex scan. This showed an acute DVT of the left lower extremity. Reportedly, the emergency room physician was concerned enough about the swelling in the left leg that he discussed potential thrombolysis with interventional radiology. They felt that she was a potential candidate for thrombolysis however her renal function was abnormal. This reason she was admitted to the medical service for hydration and to monitor her creatinine before considering thrombolysis. I was consult is for a second opinion concerning the need for thrombolysis.  She does tell me that she had brain surgery 10 years ago for a meningioma. She does not remember the exact details of this and I do not see any of these records in [facility].  She denies any recent surgery, injury, or prolonged travel. She has no history of cancer. He denies any chest pain or shortness of breath. She has had no previous history of pulmonary embolus.  Past Medical History  Diagnosis Date  . Renal disorder   . Hx of kidney transplant     right  . Coronary artery disease   . Hypertension   . Thyroid disease   . Multiple allergies   . Abnormal gait   . Reflux   . Diverticulitis   . Hypothyroidism   . Depression   . Anxiety   . COPD, mild   . Dizziness   . Diabetes   . DVT (deep venous thrombosis)    Family History  Problem Relation Age of Onset  . Alzheimer's disease Mother   . Alcoholism Father   . High Cholesterol  Mother    SOCIAL HISTORY: History  Substance Use Topics  . Smoking status: Never Smoker   . Smokeless tobacco: Never Used  . Alcohol Use: No   Allergies  Allergen Reactions  . Aricept [Donepezil Hcl] Nausea And Vomiting  . Codeine Nausea And Vomiting  . Penicillins Other (See Comments)    Doesn't remember  . Statins Other (See Comments)    Doesn't remember  . Streptomycin Other (See Comments)    unknown  . Tetracyclines & Related Rash   Current Facility-Administered Medications  Medication Dose Route Frequency Provider Last Rate Last Dose  . acetaminophen (TYLENOL) tablet 650 mg  650 mg Oral Q6H PRN Geradine Girt, DO       Or  . acetaminophen (TYLENOL) suppository 650 mg  650 mg Rectal Q6H PRN Geradine Girt, DO      . albuterol (PROVENTIL) (2.5 MG/3ML) 0.083% nebulizer solution 2.5 mg  2.5 mg Nebulization Q6H PRN Geradine Girt, DO      . amLODipine (NORVASC) tablet 5 mg  5 mg Oral Daily Geradine Girt, DO   5 mg at 03/02/14 1012  . azaTHIOprine (IMURAN) tablet 50 mg  50 mg Oral BID Geradine Girt, DO   50 mg at 03/02/14 1015  . budesonide-formoterol (SYMBICORT) 160-4.5 MCG/ACT inhaler 2 puff  2 puff Inhalation BID Geradine Girt, DO   2 puff at 03/02/14 0825  .  cloNIDine (CATAPRES) tablet 0.1 mg  0.1 mg Oral BID Geradine Girt, DO   0.1 mg at 03/02/14 1012  . colesevelam [facility]) tablet 1,875 mg  1,875 mg Oral BID WC Jessica U Vann, DO   1,875 mg at 03/02/14 1014  . cycloSPORINE (SANDIMMUNE) capsule 100 mg  100 mg Oral BID Geradine Girt, DO   100 mg at 03/02/14 1015  . darifenacin (ENABLEX) 24 hr tablet 7.5 mg  7.5 mg Oral q morning - 10a Jessica U Vann, DO   7.5 mg at 03/02/14 1013  . FLUoxetine (PROZAC) tablet 20 mg  20 mg Oral Daily Jessica U Vann, DO   20 mg at 03/02/14 1014  . fluticasone (FLONASE) 50 MCG/ACT nasal spray 1 spray  1 spray Each Nare Daily PRN Geradine Girt, DO      . heparin ADULT infusion 100 units/mL (25000 units/250 mL)  1,250 Units/hr Intravenous  Continuous Deboraha Sprang, RPH 12.5 mL/hr at 03/02/14 0700 1,250 Units/hr at 03/02/14 0700  . insulin aspart (novoLOG) injection 0-5 Units  0-5 Units Subcutaneous QHS Jessica U Vann, DO      . insulin aspart (novoLOG) injection 0-9 Units  0-9 Units Subcutaneous TID WC Jessica U Vann, DO      . levothyroxine (SYNTHROID, LEVOTHROID) tablet 75 mcg  75 mcg Oral QAC breakfast Geradine Girt, DO   75 mcg at 03/02/14 1013  . loratadine (CLARITIN) tablet 10 mg  10 mg Oral Daily Geradine Girt, DO   10 mg at 03/02/14 1013  . metoprolol succinate (TOPROL-XL) 24 hr tablet 100 mg  100 mg Oral Daily Jessica U Vann, DO   100 mg at 03/02/14 1013  . mirabegron ER (MYRBETRIQ) tablet 25 mg  25 mg Oral QPM Geradine Girt, DO   25 mg at 03/02/14 0059  . ondansetron (ZOFRAN) tablet 4 mg  4 mg Oral Q8H PRN Geradine Girt, DO      . pantoprazole (PROTONIX) EC tablet 40 mg  40 mg Oral Daily Geradine Girt, DO   40 mg at 03/02/14 1015  . predniSONE (DELTASONE) tablet 5 mg  5 mg Oral Q breakfast Geradine Girt, DO   5 mg at 03/02/14 1013  . [START ON 03/03/2014] sulfamethoxazole-trimethoprim (BACTRIM,SEPTRA) 400-80 MG per tablet 1 tablet  1 tablet Oral Once per day on Mon Wed Fri Jessica U Vann, DO       REVIEW OF SYSTEMS: Valu.Nieves ] denotes positive finding; [  ] denotes negative finding CARDIOVASCULAR:  [ ]  chest pain   [ ]  chest pressure   [ ]  palpitations   [ ]  orthopnea   [ ]  dyspnea on exertion   [ ]  claudication   [ ]  rest pain   Valu.Nieves ] DVT   [ ]  phlebitis PULMONARY:   [ ]  productive cough   [ ]  asthma   [ ]  wheezing NEUROLOGIC:   [ ]  weakness  [ ]  paresthesias  [ ]  aphasia  [ ]  amaurosis  [ ]  dizziness HEMATOLOGIC:   [ ]  bleeding problems   [ ]  clotting disorders MUSCULOSKELETAL:  [ ]  joint pain   [ ]  joint swelling [ ]  leg swelling GASTROINTESTINAL: [ ]   blood in stool  [ ]   hematemesis GENITOURINARY:  [ ]   dysuria  [ ]   hematuria PSYCHIATRIC:  [ ]  history of major depression INTEGUMENTARY:  [ ]  rashes  [ ]   ulcers CONSTITUTIONAL:  [ ]  fever   [ ]   chills  PHYSICAL EXAM: Filed Vitals:   03/01/14 2217 03/01/14 2258 03/02/14 0454 03/02/14 0946  BP: 110/70 145/62 152/61 180/76  Pulse: 60 60 57 82  Temp: 98 F (36.7 C) 98.4 F (36.9 C) 97.8 F (36.6 C) 97.8 F (36.6 C)  TempSrc:  Oral Oral Oral  Resp: 18 16 18 18   Height:  5\' 4"  (1.626 m)    Weight:  169 lb 8 oz (76.885 kg)    SpO2: 98% 94% 95% 99%   Body mass index is 29.08 kg/(m^2). GENERAL: The patient is a well-nourished female, in no acute distress. The vital signs are documented above. CARDIOVASCULAR: There is a regular rate and rhythm. I do not detect carotid bruits. She has palpable femoral pulses and palpable pedal pulses bilaterally. She has bilateral lower extremity swelling but more significantly on the left side. The left lower extremity is swollen up to the groin. PULMONARY: There is good air exchange bilaterally without wheezing or rales. ABDOMEN: Soft and non-tender with normal pitched bowel sounds.  MUSCULOSKELETAL: There are no major deformities or cyanosis. NEUROLOGIC: No focal weakness or paresthesias are detected. SKIN: There are no ulcers or rashes noted. PSYCHIATRIC: The patient has a normal affect.  DATA:  Lab Results  Component Value Date   WBC 8.4 03/02/2014   HGB 10.4* 03/02/2014   HCT 32.2* 03/02/2014   MCV 93.1 03/02/2014   PLT 200 03/02/2014   Lab Results  Component Value Date   NA 141 03/02/2014   K 3.9 03/02/2014   CL 106 03/02/2014   CO2 21 03/02/2014   Lab Results  Component Value Date   CREATININE 1.28* 03/02/2014   Lab Results  Component Value Date   INR 1.04 03/01/2014   INR 0.94 08/26/2010   No results found for this basename: HGBA1C   CBG (last 3)   Recent Labs  03/01/14 1723 03/01/14 2325 03/02/14 0811  GLUCAP 128* 142* 103*   VENOUS DUPLEX: her venous duplex scan on 03/01/2014 shows a DVT of the left lower extremity involving the saphenofemoral junction, common femoral vein,  profunda vein, and femoral vein. There was also some superficial thrombus in the left greater saphenous vein.  MEDICAL ISSUES: Left lower extremity DVT: this patient presents with an acute DVT of the left lower extremity involving the common femoral vein, profunda vein, femoral vein, and greater saphenous vein. It is difficult to assess the iliac vein by duplex so the status of the iliac vein is not clear. She is currently on IV heparin. I've explained that the options are to convert her to oral anticoagulation was continued leg elevation and compression therapy versus can sit or thrombolysis. The most recent data suggests that thrombolysis is most effective if the clot extends above the inguinal ligament. Therefore based on her duplex scan there is no strong evidence that thrombolysis has significant advantage. In addition she's had previous brain surgery although this was 10 years ago and also had some issues with mild renal insufficiency. In addition she is 80 years old. Given the amount of swelling she has I do not think that it would be inappropriate to do thrombolysis, although there would be some risk involved of bleeding including the risk of intracranial bleeding. All things considered, I think I would ever a conservative approach with converting her to oral anticoagulation continuing leg elevation and compression therapy.  Angelia Mould [facility] [place] Beeper: [phone]

## 2014-03-02 NOTE — Consult Note (Signed)
[place] for Heparin Indication: DVT  Allergies  Allergen Reactions  . Aricept [Donepezil Hcl] Nausea And Vomiting  . Codeine Nausea And Vomiting  . Penicillins Other (See Comments)    Doesn't remember  . Statins Other (See Comments)    Doesn't remember  . Streptomycin Other (See Comments)    unknown  . Tetracyclines & Related Rash    Patient Measurements: Height: 5\' 4"  (162.6 cm) Weight: 169 lb 8 oz (76.885 kg) IBW/kg (Calculated) : 54.7 Heparin Dosing Weight: ~72kg  Vital Signs: Temp: 98.4 F (36.9 C) (05/18 2258) Temp src: Oral (05/18 2258) BP: 145/62 mmHg (05/18 2258) Pulse Rate: 60 (05/18 2258)  Labs:  Recent Labs  03/01/14 1239 03/02/14 0118  HGB 12.0 10.4*  HCT 37.2 32.2*  PLT 224 200  LABPROT 13.4  --   INR 1.04  --   HEPARINUNFRC  --  0.62  CREATININE 1.52* 1.28*    Estimated Creatinine Clearance: 35.8 ml/min (by C-G formula based on Cr of 1.28).  Assessment: 80 yo female with DVT for heparin  Goal of Therapy:  Heparin level 0.3-0.7 units/ml Monitor platelets by anticoagulation protocol: Yes   Plan:  Continue Heparin at current rate Recheck level in am  [employer] Goldia Ligman 03/02/2014,2:30 AM

## 2014-03-02 NOTE — Progress Notes (Addendum)
PROGRESS NOTE  Patricia [name] [id number] DOB: 1932/04/30 DOA: 03/01/2014 PCP: [name], MD  Interim Summary 80 year old female with a history of DVT of her right lower extremity (approximately 4-5 years ago), renal transplant, CAD, hypertension presents with two-day history of left leg pain and swelling. The patient denies any recent surgery, long travels, or trauma. There has been no new medication changes. Venous duplex in the emergency department revealed DVT in the left saphenofemoral, common femoral, profunda, and femoral veins.  Apparently, there was concern for discoloration of the left lower extremity; therefore the patient was admitted for possible thrombolysis of the clot. Vascular surgery was consulted on the morning of 03/02/2014.  Assessment/Plan: Acute DVT left lower extremity -saphenofemoral, common femoral, profunda, and femoral veins appears to be unprovoked by clinical history -no signs of phlegmasia cerulea alba or phlegmasia cerulea dolens -popliteal, PT, DP pulses palpable, compartments soft -consulted Vascular--spoke with Dr. Scot Dock for opinion -continue heparin drip at this time -Discussed the risks, benefits, and alternatives of anticoagulation with the patient and her husband--> they have decided on warfarin long-term rather than taking factor Xa antagonsts -patient and husband unclear regarding the circumstances around her initial DVT in the right leg 5 years ago  Hypertension -Continue amlodipine, metoprolol succinate, clonidine CKD stage III with history of renal transplantation (left) -Continue cyclosporin, prednisone, Imuran -Baseline creatinine 1.0-1.4 -Hold Lasix for today, and to restart tomorrow if creatinine is at baseline -Continue prophylactic Bactrim COPD -Continue Symbicort -Compensated Overactive bladder  -Continue Enablex and mirabegron Hypothyroidism  -Will continue thyroxine.    Family Communication:   Updated  husband Disposition Plan:   Home when medically stable     Procedures/Studies: Dg Chest 2 View  03/01/2014   CLINICAL DATA:  Leg pain and swelling  EXAM: CHEST  2 VIEW  COMPARISON:  04/23/2012 CT chest, chest x-ray 04/23/2012  FINDINGS: The heart size and mediastinal contours are within normal limits. There is stable lingular scarring. No focal consolidation, pleural effusion or pneumothorax. Calcified left upper lobe pulmonary nodule likely representing sequela prior granulomatous disease. The visualized skeletal structures are unremarkable.  IMPRESSION: No active cardiopulmonary disease.   Electronically Signed   By: Kathreen Devoid   On: 03/01/2014 13:12         Subjective: Patient denies fevers, chills, headache, chest pain, dyspnea, nausea, vomiting, diarrhea, abdominal pain, dysuria, hematuria. She states that her left leg pain is improving.   Objective: Filed Vitals:   03/01/14 2139 03/01/14 2217 03/01/14 2258 03/02/14 0454  BP: 110/70 110/70 145/62 152/61  Pulse: 60 60 60 57  Temp: 98 F (36.7 C) 98 F (36.7 C) 98.4 F (36.9 C) 97.8 F (36.6 C)  TempSrc: Oral  Oral Oral  Resp: 18 18 16 18   Height:   5\' 4"  (1.626 m)   Weight:   76.885 kg (169 lb 8 oz)   SpO2: 97% 98% 94% 95%    Intake/Output Summary (Last 24 hours) at 03/02/14 0739 Last data filed at 03/02/14 0513  Gross per 24 hour  Intake      0 ml  Output    350 ml  Net   -350 ml   Weight change:  Exam:   General:  Pt is alert, follows commands appropriately, not in acute distress  HEENT: No icterus, No thrush,  [place]/AT  Cardiovascular: RRR, S1/S2, no rubs, no gallops  Respiratory: CTA bilaterally, no wheezing, no crackles, no rhonchi  Abdomen: Soft/+BS, non  tender, non distended, no guarding  Extremities: 2+LE L>R edema, No lymphangitis, No petechiae, No rashes, no synovitis; left popliteal, dorsalis pedis, and posterior tibial pulses palpable. Compartments are soft in the thigh and calf.  Data  Reviewed: Basic Metabolic Panel:  Recent Labs Lab 03/01/14 1239 03/02/14 0118  NA 141 141  K 4.1 3.9  CL 104 106  CO2 22 21  GLUCOSE 133* 107*  BUN 27* 24*  CREATININE 1.52* 1.28*  CALCIUM 10.6* 9.7   Liver Function Tests:  Recent Labs Lab 03/01/14 1239  AST 17  ALT 9  ALKPHOS 76  BILITOT 0.4  PROT 7.7  ALBUMIN 3.4*   No results found for this basename: LIPASE, AMYLASE,  in the last 168 hours No results found for this basename: AMMONIA,  in the last 168 hours CBC:  Recent Labs Lab 03/01/14 1239 03/02/14 0118  WBC 9.8 8.4  HGB 12.0 10.4*  HCT 37.2 32.2*  MCV 93.7 93.1  PLT 224 200   Cardiac Enzymes: No results found for this basename: CKTOTAL, CKMB, CKMBINDEX, TROPONINI,  in the last 168 hours BNP: No components found with this basename: POCBNP,  CBG:  Recent Labs Lab 03/01/14 1723 03/01/14 2325  GLUCAP 128* 142*    No results found for this or any previous visit (from the past 240 hour(s)).   Scheduled Meds: . amLODipine  5 mg Oral Daily  . azaTHIOprine  50 mg Oral BID  . budesonide-formoterol  2 puff Inhalation BID  . cloNIDine  0.1 mg Oral BID  . colesevelam  1,875 mg Oral BID WC  . cycloSPORINE  100 mg Oral BID  . darifenacin  7.5 mg Oral q morning - 10a  . FLUoxetine  20 mg Oral Daily  . insulin aspart  0-5 Units Subcutaneous QHS  . insulin aspart  0-9 Units Subcutaneous TID WC  . levothyroxine  75 mcg Oral QAC breakfast  . loratadine  10 mg Oral Daily  . metoprolol succinate  100 mg Oral Daily  . mirabegron ER  25 mg Oral QPM  . pantoprazole  40 mg Oral Daily  . predniSONE  5 mg Oral Q breakfast  . [START ON 03/03/2014] sulfamethoxazole-trimethoprim  1 tablet Oral Once per day on Mon Wed Fri   Continuous Infusions: . sodium chloride 75 mL/hr at 03/01/14 2319  . heparin 1,250 Units/hr (03/01/14 1648)     Orson Eva, DO  [facility] Pager [phone]  If 7PM-7AM, please contact night-coverage [email or web address] Password  [email or web address] 03/02/2014, 7:39 AM   LOS: 1 day

## 2014-03-02 NOTE — Consult Note (Signed)
ANTICOAGULATION CONSULT NOTE - FOLLOW UP  Pharmacy Consult:  Heparin Indication: DVT  Allergies  Allergen Reactions  . Aricept [Donepezil Hcl] Nausea And Vomiting  . Codeine Nausea And Vomiting  . Penicillins Other (See Comments)    Doesn't remember  . Statins Other (See Comments)    Doesn't remember  . Streptomycin Other (See Comments)    unknown  . Tetracyclines & Related Rash    Patient Measurements: Height: 5\' 4"  (162.6 cm) Weight: 169 lb 8 oz (76.885 kg) IBW/kg (Calculated) : 54.7 Heparin Dosing Weight: 70 kg  Vital Signs: Temp: 97.8 F (36.6 C) (05/19 0946) Temp src: Oral (05/19 0946) BP: 180/76 mmHg (05/19 0946) Pulse Rate: 82 (05/19 0946)  Labs:  Recent Labs  03/01/14 1239 03/02/14 0118 03/02/14 1013  HGB 12.0 10.4*  --   HCT 37.2 32.2*  --   PLT 224 200  --   LABPROT 13.4  --   --   INR 1.04  --   --   HEPARINUNFRC  --  0.62 0.66  CREATININE 1.52* 1.28*  --     Estimated Creatinine Clearance: 35.8 ml/min (by C-G formula based on Cr of 1.28).    Assessment: 84 YOF with new LLE DVT to continue on IV heparin.  Heparin level therapeutic but is toward the high end of goal.  No bleeding reported.   Goal of Therapy:  Heparin level 0.3-0.7 units/ml Monitor platelets by anticoagulation protocol: Yes     Plan:  - Decrease heparin gtt to 1200 units/hr - Daily HL / CBC - F/U start oral AC when possible    Calie Buttrey D. Mina Marble, PharmD, BCPS Pager:  [phone] 03/02/2014, 11:32 AM

## 2014-03-02 NOTE — Progress Notes (Signed)
Patient ID: Patricia [name], female   DOB: 07-11-1932, 80 y.o.   MRN: [id number] Request received for  LLE DVT thrombolysis on pt. Hx/imaging studies/labs were reviewed by Dr. Barbie Banner and case d/w Dr. Carles Collet. Pt/family have chosen to remain on heparin/coumadin at this time. Pt's creatinine is improved today and even if normalizes soon she does face risk of contrast induced nephropathy if lysis is performed . Please call our service at [phone] if we can be of further assistance.

## 2014-03-02 NOTE — Progress Notes (Signed)
UR completed 

## 2014-03-03 DIAGNOSIS — E039 Hypothyroidism, unspecified: Secondary | ICD-10-CM

## 2014-03-03 LAB — BASIC METABOLIC PANEL
BUN: 13 mg/dL (ref 6–23)
CO2: 19 meq/L (ref 19–32)
Calcium: 9.6 mg/dL (ref 8.4–10.5)
Chloride: 110 mEq/L (ref 96–112)
Creatinine, Ser: 1.14 mg/dL — ABNORMAL HIGH (ref 0.50–1.10)
GFR calc Af Amer: 52 mL/min — ABNORMAL LOW (ref >90)
GFR calc non Af Amer: 45 mL/min — ABNORMAL LOW (ref >90)
GLUCOSE: 130 mg/dL — AB (ref 70–99)
Potassium: 3.8 mEq/L (ref 3.7–5.3)
SODIUM: 142 meq/L (ref 137–147)

## 2014-03-03 LAB — GLUCOSE, CAPILLARY
Glucose-Capillary: 122 mg/dL — ABNORMAL HIGH (ref 70–99)
Glucose-Capillary: 127 mg/dL — ABNORMAL HIGH (ref 70–99)
Glucose-Capillary: 170 mg/dL — ABNORMAL HIGH (ref 70–99)
Glucose-Capillary: 129 mg/dL — ABNORMAL HIGH (ref 70–99)

## 2014-03-03 LAB — CBC
HCT: 29.7 % — ABNORMAL LOW (ref 36.0–46.0)
Hemoglobin: 9.8 g/dL — ABNORMAL LOW (ref 12.0–15.0)
MCH: 30.2 pg (ref 26.0–34.0)
MCHC: 33 g/dL (ref 30.0–36.0)
MCV: 91.7 fL (ref 78.0–100.0)
PLATELETS: 238 10*3/uL (ref 150–400)
RBC: 3.24 MIL/uL — AB (ref 3.87–5.11)
RDW: 14.9 % (ref 11.5–15.5)
WBC: 11 10*3/uL — ABNORMAL HIGH (ref 4.0–10.5)

## 2014-03-03 MED ORDER — WARFARIN SODIUM 5 MG PO TABS
5.0000 mg | ORAL_TABLET | Freq: Once | ORAL | Status: AC
  Administered 2014-03-03: 5 mg via ORAL
  Filled 2014-03-03 (×2): qty 1

## 2014-03-03 MED ORDER — ENOXAPARIN SODIUM 120 MG/0.8ML ~~LOC~~ SOLN
120.0000 mg | SUBCUTANEOUS | Status: DC
  Administered 2014-03-03 – 2014-03-04 (×2): 120 mg via SUBCUTANEOUS
  Filled 2014-03-03 (×2): qty 0.8

## 2014-03-03 MED ORDER — COUMADIN BOOK
Freq: Once | Status: AC
  Administered 2014-03-03: 11:00:00
  Filled 2014-03-03: qty 1

## 2014-03-03 MED ORDER — WARFARIN VIDEO: Freq: Once | Status: DC

## 2014-03-03 MED ORDER — WARFARIN - PHARMACIST DOSING INPATIENT: Freq: Every day | Status: DC

## 2014-03-03 MED ORDER — FLUOXETINE HCL 20 MG PO CAPS
20.0000 mg | ORAL_CAPSULE | Freq: Every day | ORAL | Status: DC
  Administered 2014-03-03 – 2014-03-04 (×2): 20 mg via ORAL
  Filled 2014-03-03 (×2): qty 1

## 2014-03-03 NOTE — Discharge Instructions (Signed)
Information on my medicine - Coumadin   (Warfarin)  This medication education was reviewed with me or my healthcare representative as part of my discharge preparation.  The pharmacist that spoke with me during my hospital stay was:  Verna Czech, [place]  Why was Coumadin prescribed for you? Coumadin was prescribed for you because you have a blood clot or a medical condition that can cause an increased risk of forming blood clots. Blood clots can cause serious health problems by blocking the flow of blood to the heart, lung, or brain. Coumadin can prevent harmful blood clots from forming. As a reminder your indication for Coumadin is:   Deep Vein Thrombosis Treatment  What test will check on my response to Coumadin? While on Coumadin (warfarin) you will need to have an INR test regularly to ensure that your dose is keeping you in the desired range. The INR (international normalized ratio) number is calculated from the result of the laboratory test called prothrombin time (PT).  If an INR APPOINTMENT HAS NOT ALREADY BEEN MADE FOR YOU please schedule an appointment to have this lab work done by your health care provider within 7 days. Your INR goal is usually a number between:  2 to 3 or your provider may give you a more narrow range like 2-2.5.  What  do you need to  know  About  COUMADIN? Take Coumadin (warfarin) exactly as prescribed by your healthcare provider about the same time each day.  DO NOT stop taking without talking to the doctor who prescribed the medication.  Stopping without other blood clot prevention medication to take the place of Coumadin may increase your risk of developing a new clot or stroke.  Get refills before you run out.  What do you do if you miss a dose? If you miss a dose, take it as soon as you remember on the same day then continue your regularly scheduled regimen the next day.  Do not take two doses of Coumadin at the same time.  Important Safety Information A  possible side effect of Coumadin (Warfarin) is an increased risk of bleeding. You should call your healthcare provider right away if you experience any of the following:   Bleeding from an injury or your nose that does not stop.   Unusual colored urine (red or dark brown) or unusual colored stools (red or black).   Unusual bruising for unknown reasons.   A serious fall or if you hit your head (even if there is no bleeding).  Some foods or medicines interact with Coumadin (warfarin) and might alter your response to warfarin. To help avoid this:   Eat a balanced diet, maintaining a consistent amount of Vitamin K.   Notify your provider about major diet changes you plan to make.   Avoid alcohol or limit your intake to 1 drink for women and 2 drinks for men per day. (1 drink is 5 oz. wine, 12 oz. beer, or 1.5 oz. liquor.)  Make sure that ANY health care provider who prescribes medication for you knows that you are taking Coumadin (warfarin).  Also make sure the healthcare provider who is monitoring your Coumadin knows when you have started a new medication including herbals and non-prescription products.  Coumadin (Warfarin)  Major Drug Interactions  Increased Warfarin Effect Decreased Warfarin Effect  Alcohol (large quantities) Antibiotics (esp. Septra/Bactrim, Flagyl, Cipro) Amiodarone (Cordarone) Aspirin (ASA) Cimetidine (Tagamet) Megestrol (Megace) NSAIDs (ibuprofen, naproxen, etc.) Piroxicam (Feldene) Propafenone (Rythmol SR) Propranolol (Inderal) Isoniazid (INH)  Posaconazole (Noxafil) Barbiturates (Phenobarbital) Carbamazepine (Tegretol) Chlordiazepoxide (Librium) Cholestyramine (Questran) Griseofulvin Oral Contraceptives Rifampin Sucralfate (Carafate) Vitamin K   Coumadin (Warfarin) Major Herbal Interactions  Increased Warfarin Effect Decreased Warfarin Effect  Garlic Ginseng Ginkgo biloba Coenzyme Q10 Green tea St. Johns wort    Coumadin (Warfarin) FOOD  Interactions  Eat a consistent number of servings per week of foods HIGH in Vitamin K (1 serving =  cup)  Collards (cooked, or boiled & drained) Kale (cooked, or boiled & drained) Mustard greens (cooked, or boiled & drained) Parsley *serving size only =  cup Spinach (cooked, or boiled & drained) Swiss chard (cooked, or boiled & drained) Turnip greens (cooked, or boiled & drained)  Eat a consistent number of servings per week of foods MEDIUM-HIGH in Vitamin K (1 serving = 1 cup)  Asparagus (cooked, or boiled & drained) Broccoli (cooked, boiled & drained, or raw & chopped) Brussel sprouts (cooked, or boiled & drained) *serving size only =  cup Lettuce, raw (green leaf, endive, romaine) Spinach, raw Turnip greens, raw & chopped   These websites have more information on Coumadin (warfarin):  FailFactory.se; VeganReport.com.au;

## 2014-03-03 NOTE — Consult Note (Signed)
ANTICOAGULATION CONSULT NOTE - FOLLOW UP  Pharmacy Consult:  Lovenox and warfarin Indication: DVT treatment  Allergies  Allergen Reactions  . Aricept [Donepezil Hcl] Nausea And Vomiting  . Codeine Nausea And Vomiting  . Penicillins Other (See Comments)    Doesn't remember  . Statins Other (See Comments)    Doesn't remember  . Streptomycin Other (See Comments)    unknown  . Tetracyclines & Related Rash    Patient Measurements: Height: 5\' 4"  (162.6 cm) Weight: 169 lb 14.4 oz (77.066 kg) IBW/kg (Calculated) : 54.7  Vital Signs: Temp: 97.5 F (36.4 C) (05/20 0815) Temp src: Oral (05/20 0815) BP: 136/75 mmHg (05/20 0815) Pulse Rate: 72 (05/20 0815)  Labs:  Recent Labs  03/01/14 1239 03/02/14 0118 03/02/14 1013 03/03/14 0745  HGB 12.0 10.4*  --  9.8*  HCT 37.2 32.2*  --  29.7*  PLT 224 200  --  238  LABPROT 13.4  --   --   --   INR 1.04  --   --   --   HEPARINUNFRC  --  0.62 0.66  --   CREATININE 1.52* 1.28*  --   --     Estimated Creatinine Clearance: 35.8 ml/min (by C-G formula based on Cr of 1.28).  Assessment: 81 YOF with new LLE DVT who was on IV heparin. Now to transition to Lovenox and warfarin for outpatient treatment. Baseline INR 1.04. Hgb this morning 9.8- slight drop, platelets are normal. No bleeding noted. She is on three times weekly Septra as prophylaxis from her renal transplant in 2010. This is a longer course than normal. I spoke with her and she stated she has not had an infection and her transplant doctor has not given her an estimated length of therapy. Septra can potentiate INR, which I explained to her. She told me she likes dark leafy green vegetables (contain vitamin K)- I instructed her that it was ok to keep eating these as long as she was consistent. I suggested her having a serving on the days she takes her Septra to keep everything easy to remember.  Goal of Therapy:  INR 2-3 Anti-Xa level 0.6-1 units/ml 4hrs after LMWH dose  given Monitor platelets by anticoagulation protocol: Yes   Plan:  - stop heparin at 1000 - Lovenox 120mg  subq Q24h starting at 1100 - warfarin 5mg  po x1 tonight - daily PT/INR - q72 CBC while in hospital - needs to be on Lovenox for 5 days minimum- INR must be therapeutic for 24 hours before it can stop - recommend outpatient INR check on Tuesday 5/26 (assuming most clinics are closed Monday for Memorial Day. If she can get checked Monday that is good too)  Patricia [name] D. Cherryl Babin, PharmD, BCPS Clinical Pharmacist Pager: [phone] 03/03/2014 10:36 AM

## 2014-03-03 NOTE — Progress Notes (Signed)
Patient unable to view the coumadin video because network is not available. Will report off to night shift nurse to F/U.

## 2014-03-03 NOTE — Progress Notes (Signed)
PROGRESS NOTE  Patricia [name] [id number] DOB: 19-Apr-1932 DOA: 03/01/2014 PCP: [name], MD  Interim Summary 80 year old female with a history of DVT of her right lower extremity (approximately 4-5 years ago), renal transplant, CAD, hypertension presents with two-day history of left leg pain and swelling. The patient denies any recent surgery, long travels, or trauma. There has been no new medication changes. Venous duplex in the emergency department revealed DVT in the left saphenofemoral, common femoral, profunda, and femoral veins.  Apparently, there was concern for discoloration of the left lower extremity; therefore the patient was admitted for possible thrombolysis of the clot. Vascular surgery was consulted on the morning of 03/02/2014.  Assessment/Plan: Acute DVT left lower extremity -saphenofemoral, common femoral, profunda, and femoral veins appears to be unprovoked by clinical history -no signs of phlegmasia cerulea alba or phlegmasia cerulea dolens -popliteal, PT, DP pulses palpable, compartments soft -consulted Vascular--spoke with Dr. Scot Dock for opinion -continue heparin drip at this time -Discussed the risks, benefits, and alternatives of anticoagulation with the patient and her husband--> they have decided on warfarin long-term rather than taking factor Xa antagonsts, has started Coumadin and change heparin to Lovenox today. Plan will be to go home tomorrow with Lovenox and Coumadin -patient and husband unclear regarding the circumstances around her initial DVT in the right leg 5 years ago  Hypertension -Continue amlodipine, metoprolol succinate, clonidine CKD stage III with history of renal transplantation (left) -Continue cyclosporin, prednisone, Imuran -Baseline creatinine 1.0-1.4 Waiting for basic metabolic panel today. If creatinine okay, we'll re start Lasix -Continue prophylactic Bactrim COPD -Continue Symbicort -Compensated Overactive  bladder  -Continue Enablex and mirabegron Hypothyroidism  -Will continue thyroxine.   Physical therapy to ambulate  Family Communication:   Updated husband by phone Disposition Plan:   Home hopefully tomorrow     Procedures/Studies: Dg Chest 2 View  03/01/2014   CLINICAL DATA:  Leg pain and swelling  EXAM: CHEST  2 VIEW  COMPARISON:  04/23/2012 CT chest, chest x-ray 04/23/2012  FINDINGS: The heart size and mediastinal contours are within normal limits. There is stable lingular scarring. No focal consolidation, pleural effusion or pneumothorax. Calcified left upper lobe pulmonary nodule likely representing sequela prior granulomatous disease. The visualized skeletal structures are unremarkable.  IMPRESSION: No active cardiopulmonary disease.   Electronically Signed   By: Kathreen Devoid   On: 03/01/2014 13:12     Subjective: Patient doing okay. Denies any leg pain today. Has not yet been out of bed  Objective: Filed Vitals:   03/02/14 1938 03/02/14 2100 03/03/14 0500 03/03/14 0815  BP:  175/70 148/68 136/75  Pulse:  78 68 72  Temp:  100.1 F (37.8 C) 97.7 F (36.5 C) 97.5 F (36.4 C)  TempSrc:  Oral Oral Oral  Resp:  18 18 18   Height:      Weight:  77.066 kg (169 lb 14.4 oz)    SpO2: 96% 93% 98% 97%    Intake/Output Summary (Last 24 hours) at 03/03/14 1034 Last data filed at 03/03/14 0900  Gross per 24 hour  Intake    480 ml  Output      4 ml  Net    476 ml   Weight change: 0.866 kg (1 lb 14.6 oz) Exam:   General:  Pt is alert oriented, no acute distress  Cardiovascular: Regular rate and rhythm, S1-S2  Respiratory: Clear to auscultation bilaterally  Abdomen: Soft, nontender, nondistended, positive bowel sounds  Extremities: 2+ pitting edema from the knees down bilaterally, left greater than right. No rashes. Pulses appear to be intact  Data Reviewed: Basic Metabolic Panel:  Recent Labs Lab 03/01/14 1239 03/02/14 0118  NA 141 141  K 4.1 3.9  CL 104 106    CO2 22 21  GLUCOSE 133* 107*  BUN 27* 24*  CREATININE 1.52* 1.28*  CALCIUM 10.6* 9.7   Liver Function Tests:  Recent Labs Lab 03/01/14 1239  AST 17  ALT 9  ALKPHOS 76  BILITOT 0.4  PROT 7.7  ALBUMIN 3.4*   No results found for this basename: LIPASE, AMYLASE,  in the last 168 hours No results found for this basename: AMMONIA,  in the last 168 hours CBC:  Recent Labs Lab 03/01/14 1239 03/02/14 0118 03/03/14 0745  WBC 9.8 8.4 11.0*  HGB 12.0 10.4* 9.8*  HCT 37.2 32.2* 29.7*  MCV 93.7 93.1 91.7  PLT 224 200 238   Cardiac Enzymes: No results found for this basename: CKTOTAL, CKMB, CKMBINDEX, TROPONINI,  in the last 168 hours BNP: No components found with this basename: POCBNP,  CBG:  Recent Labs Lab 03/02/14 0811 03/02/14 1154 03/02/14 1644 03/02/14 2103 03/03/14 0751  GLUCAP 103* 170* 180* 129* 122*    No results found for this or any previous visit (from the past 240 hour(s)).   Scheduled Meds: . amLODipine  5 mg Oral Daily  . azaTHIOprine  50 mg Oral BID  . budesonide-formoterol  2 puff Inhalation BID  . cloNIDine  0.1 mg Oral BID  . colesevelam  1,875 mg Oral BID WC  . cycloSPORINE  100 mg Oral BID  . darifenacin  7.5 mg Oral q morning - 10a  . docusate sodium  100 mg Oral BID  . FLUoxetine  20 mg Oral Daily  . insulin aspart  0-5 Units Subcutaneous QHS  . insulin aspart  0-9 Units Subcutaneous TID WC  . levothyroxine  75 mcg Oral QAC breakfast  . loratadine  10 mg Oral Daily  . metoprolol succinate  100 mg Oral Daily  . mirabegron ER  25 mg Oral QPM  . pantoprazole  40 mg Oral Daily  . predniSONE  5 mg Oral Q breakfast  . senna  2 tablet Oral Daily  . sulfamethoxazole-trimethoprim  1 tablet Oral Once per day on Mon Wed Fri   Continuous Infusions: . heparin 1,200 Units/hr (03/02/14 1154)     Annita Brod, MD  [facility] Pager [phone]  If 7PM-7AM, please contact night-coverage [email or web address] Password [email or web address] 03/03/2014,  10:34 AM   LOS: 2 days

## 2014-03-04 LAB — BASIC METABOLIC PANEL
BUN: 12 mg/dL (ref 6–23)
CO2: 19 mEq/L (ref 19–32)
Calcium: 9.7 mg/dL (ref 8.4–10.5)
Chloride: 111 mEq/L (ref 96–112)
Creatinine, Ser: 1.26 mg/dL — ABNORMAL HIGH (ref 0.50–1.10)
GFR calc non Af Amer: 39 mL/min — ABNORMAL LOW (ref >90)
GFR, EST AFRICAN AMERICAN: 46 mL/min — AB (ref >90)
Glucose, Bld: 142 mg/dL — ABNORMAL HIGH (ref 70–99)
POTASSIUM: 3.8 meq/L (ref 3.7–5.3)
SODIUM: 145 meq/L (ref 137–147)

## 2014-03-04 LAB — GLUCOSE, CAPILLARY
GLUCOSE-CAPILLARY: 207 mg/dL — AB (ref 70–99)
GLUCOSE-CAPILLARY: 167 mg/dL — AB (ref 70–99)
Glucose-Capillary: 121 mg/dL — ABNORMAL HIGH (ref 70–99)

## 2014-03-04 LAB — PROTIME-INR
INR: 1.12 (ref 0.00–1.49)
PROTHROMBIN TIME: 14.2 s (ref 11.6–15.2)

## 2014-03-04 MED ORDER — WARFARIN SODIUM 5 MG PO TABS: ORAL_TABLET | ORAL | Status: DC

## 2014-03-04 MED ORDER — WARFARIN SODIUM 7.5 MG PO TABS
7.5000 mg | ORAL_TABLET | ORAL | Status: DC
  Filled 2014-03-04: qty 1

## 2014-03-04 MED ORDER — WARFARIN SODIUM 7.5 MG PO TABS
7.5000 mg | ORAL_TABLET | Freq: Once | ORAL | Status: DC
  Filled 2014-03-04: qty 1

## 2014-03-04 MED ORDER — ENOXAPARIN SODIUM 60 MG/0.6ML ~~LOC~~ SOLN: 120.0000 mg | SUBCUTANEOUS | Status: DC

## 2014-03-04 NOTE — Care Management Note (Signed)
CARE MANAGEMENT NOTE 03/04/2014  Patient:  Patricia [name], Patricia [name]   Account Number:  [id number]  Date Initiated:  03/02/2014  Documentation initiated by:  Lizabeth Leyden  Subjective/Objective Assessment:   admitted with left lower leg swelling +DVT  medial hx: renal transplant,     Action/Plan:   progression of care and discharge planning  03/04/2014 Met with pt and husband re HH needs, they selected [facility] for HHPT, pt has walker, 3:1, tub seat and WC.   Anticipated DC Date:  03/05/2014   Anticipated DC Plan:  [place]         Choice offered to / List presented to:          [facility] arranged  [place].   Status of service:  Completed, signed off Medicare Important Message given?   (If response is "NO", the following Medicare IM given date fields will be blank) Date Medicare IM given:   Date Additional Medicare IM given:    Discharge Disposition:  [place]  Per UR Regulation:    If discussed at Long Length of Stay Meetings, dates discussed:    Comments:  03/04/2014 Met with pt and husband re HHPT, they selected [facility], [place] notified of [place] needs. They will see pt this am. Jasmine Pang RN MPH, case manager, [phone]   03/03/2014  [place], [place]  [phone]  Benefits check per CMA:  Lovenox  PT HAS A COINSURANCE OF 25% WITH A MAX OF $95-NO PRIOR AUTH REQUIRED  ---03/03/2014 1313 by [facility]--- Please contact pt insurance provider re pt copay for : Lovenox $RemoveB'120mg'umeDOxRk$  daily x 14 days. Thank you,

## 2014-03-04 NOTE — Discharge Summary (Signed)
Physician Discharge Summary  MIELLE EMSLIE [id number] DOB: 06-Dec-1931 DOA: 03/01/2014  PCP: [name], MD  Admit date: 03/01/2014 Discharge date: 03/04/2014  Time spent: 25 minutes  Recommendations for Outpatient Follow-up:  1. Patient will followup with her primary care physician, Dr. Felipa Eth in his office tomorrow, 5/22 between the hours of 9 AM-10:30 AM for INR check 2. New medication: Coumadin 5 mg by mouth each bedtime starting Friday 5/22 3. New medication: Lovenox 120 mg subcutaneous daily starting 5/22 morning x5 days (if medication is to be continued past Tuesday 5/26), this medication will need to be refilled by her primary care physician  Discharge Diagnoses:  Principal Problem:   Upper leg DVT (deep venous thromboembolism), acute Active Problems:   History of renal transplant   Diabetes   DVT (deep venous thrombosis)   AKI (acute kidney injury)   CKD (chronic kidney disease) stage 3, GFR 30-59 ml/min   Unspecified essential hypertension   Discharge Condition: Improved, being discharged home  Diet recommendation: Low sodium heart healthy  Filed Weights   03/01/14 1552 03/01/14 2258 03/02/14 2100  Weight: 76.2 kg (167 lb 15.9 oz) 76.885 kg (169 lb 8 oz) 77.066 kg (169 lb 14.4 oz)    History of present illness:  80 year old female with a history of DVT of her right lower extremity (approximately 4-5 years ago), renal transplant, CAD, hypertension presents with two-day history of left leg pain and swelling to the emergency room on 5/18. The patient denies any recent surgery, long travels, or trauma. There has been no new medication changes. Venous duplex in the emergency department revealed DVT in the left saphenofemoral, common femoral, profunda, and femoral veins. Apparently, there was concern for discoloration of the left lower extremity; therefore the patient was admitted for possible thrombolysis of the clot   Hospital Course:  Principal Problem:    Upper leg DVT (deep venous thromboembolism), acute:no signs of phlegmasia cerulea alba or phlegmasia cerulea dolens. Patient was put on a heparin drip. After evaluation by vascular surgery and interventional radiology initially, lysis of clot was held because of acute renal failure. There were concerns that even once creatinine was normalized, she is at risk for contrast-induced nephropathy. Patient's husband and herself decided upon long-term anticoagulation with Lovenox and Coumadin. Lovenox started at 120 mg daily which has been tolerating well. Coumadin was started on 5/20. Patient received 7.5 mg on 5/21 for discharge. She will followup and actually have an INR checked on 5/22 by her PCP.  Active Problems:     Diabetes mellitus: Diet controlled. A1c at 6.2. Sugars remained stable during this hospitalization.  Hypothyroidism: Continue Synthroid    AKI (acute kidney injury): Patient is noted a creatinine of 1.52 on admission. Diuretic was held and her creatinine has since trended down. By day of discharge and 1.21. She'll followup with her PCP.     History of renal transplant/CKD (chronic kidney disease) stage 3, GFR 30-59 ml/min: Stable. Continued on cyclosporine, prednisone, Imuran    Unspecified essential hypertension: Antihypertensives restarted on discharge  Procedures:  None  Consultations:  Interventional radiology  Vascular surgery  Discharge Exam: Filed Vitals:   03/04/14 1035  BP: 118/47  Pulse: 63  Temp: 98.6 F (37 C)  Resp: 16    General: Oriented x3, no acute distress Cardiovascular: Regular rate and rhythm, S1-S2 Respiratory: Clear to auscultation bilaterally  Discharge Instructions You were cared for by a hospitalist during your hospital stay. If you have any questions about your discharge medications or  the care you received while you were in the hospital after you are discharged, you can call the unit and asked to speak with the hospitalist on call if the  hospitalist that took care of you is not available. Once you are discharged, your primary care physician will handle any further medical issues. Please note that NO REFILLS for any discharge medications will be authorized once you are discharged, as it is imperative that you return to your primary care physician (or establish a relationship with a primary care physician if you do not have one) for your aftercare needs so that they can reassess your need for medications and monitor your lab values.  Discharge Instructions   Diet - low sodium heart healthy    Complete by:  As directed      Increase activity slowly    Complete by:  As directed             Medication List         albuterol 108 (90 BASE) MCG/ACT inhaler  Commonly known as:  PROVENTIL HFA;VENTOLIN HFA  Inhale 2 puffs into the lungs every 6 (six) hours as needed for wheezing or shortness of breath. Patient uses this medication prn.     amLODipine 5 MG tablet  Commonly known as:  NORVASC  Take 5 mg by mouth daily.     azaTHIOprine 50 MG tablet  Commonly known as:  IMURAN  Take 50 mg by mouth 2 (two) times daily.     budesonide-formoterol 160-4.5 MCG/ACT inhaler  Commonly known as:  SYMBICORT  Inhale 2 puffs into the lungs 2 (two) times daily.     cetirizine 10 MG tablet  Commonly known as:  ZYRTEC  Take 10 mg by mouth every morning.     cloNIDine 0.1 MG tablet  Commonly known as:  CATAPRES  Take 0.1 mg by mouth 2 (two) times daily.     Co Q 10 100 MG Caps  Take 1 tablet by mouth every evening.     colesevelam 625 MG tablet  Commonly known as:  WELCHOL  Take 1,875 mg by mouth 2 (two) times daily with a meal.     cycloSPORINE 100 MG capsule  Commonly known as:  SANDIMMUNE  Take 100 mg by mouth 2 (two) times daily.     ENABLEX 7.5 MG 24 hr tablet  Generic drug:  darifenacin  Take 7.5 mg by mouth every morning.     enoxaparin 60 MG/0.6ML injection  Commonly known as:  LOVENOX  Inject 1.2 mLs (120 mg total)  into the skin daily.     fenofibrate 48 MG tablet  Commonly known as:  TRICOR  Take 48 mg by mouth daily.     FLUoxetine 20 MG tablet  Commonly known as:  PROZAC  Take 20 mg by mouth daily.     fluticasone 50 MCG/ACT nasal spray  Commonly known as:  FLONASE  Place 1 spray into both nostrils daily as needed for allergies or rhinitis.     furosemide 40 MG tablet  Commonly known as:  LASIX  Take 40 mg by mouth 2 (two) times daily.     hyoscyamine 0.125 MG Tbdp disintergrating tablet  Commonly known as:  ANASPAZ  Place 0.125 mg under the tongue every 6 (six) hours as needed for bladder spasms. Patient uses this medication prn.     L-Lysine 500 MG Tabs  Take 500 mg by mouth daily.     levothyroxine 75 MCG tablet  Commonly known as:  SYNTHROID, LEVOTHROID  Take 75 mcg by mouth daily.     metoprolol succinate 100 MG 24 hr tablet  Commonly known as:  TOPROL-XL  Take 100 mg by mouth daily. Take with or immediately following a meal.     multivitamin tablet  Take 1 tablet by mouth daily.     MYRBETRIQ 25 MG Tb24 tablet  Generic drug:  mirabegron ER  Take 25 mg by mouth every evening.     omeprazole 20 MG capsule  Commonly known as:  PRILOSEC  Take 20 mg by mouth daily as needed (for heartburn).     ondansetron 4 MG tablet  Commonly known as:  ZOFRAN  Take 4 mg by mouth every 8 (eight) hours as needed for nausea or vomiting.     POTASSIUM CHLORIDE PO  Take 300 mcg by mouth every evening.     predniSONE 5 MG tablet  Commonly known as:  DELTASONE  Take 5 mg by mouth daily with breakfast.     sulfamethoxazole-trimethoprim 400-80 MG per tablet  Commonly known as:  BACTRIM,SEPTRA  Take 1 tablet by mouth 3 (three) times a week. Monday, Wednesday, and Friday.     Vitamin D 2000 UNITS tablet  Take 2,000 Units by mouth every evening.     warfarin 5 MG tablet  Commonly known as:  COUMADIN  Take 5mg  nightly starting on Friday May 22nd       Allergies  Allergen Reactions   . Aricept [Donepezil Hcl] Nausea And Vomiting  . Codeine Nausea And Vomiting  . Penicillins Other (See Comments)    Doesn't remember  . Statins Other (See Comments)    Doesn't remember  . Streptomycin Other (See Comments)    unknown  . Tetracyclines & Related Rash       Follow-up Information   Follow up with [name], MD On 03/05/2014. (Come to Dr.[name]'s office between 9am-10:30am for a coumadin level check)    Specialty:  Internal Medicine   Contact information:   [address] [place] [address] [phone]        The results of significant diagnostics from this hospitalization (including imaging, microbiology, ancillary and laboratory) are listed below for reference.    Significant Diagnostic Studies: Dg Chest 2 View  03/01/2014   CLINICAL DATA:  Leg pain and swelling  EXAM: CHEST  2 VIEW  COMPARISON:  04/23/2012 CT chest, chest x-ray 04/23/2012  FINDINGS: The heart size and mediastinal contours are within normal limits. There is stable lingular scarring. No focal consolidation, pleural effusion or pneumothorax. Calcified left upper lobe pulmonary nodule likely representing sequela prior granulomatous disease. The visualized skeletal structures are unremarkable.  IMPRESSION: No active cardiopulmonary disease.   Electronically Signed   By: Kathreen Devoid   On: 03/01/2014 13:12    Microbiology: No results found for this or any previous visit (from the past 240 hour(s)).   Labs: Basic Metabolic Panel:  Recent Labs Lab 03/01/14 1239 03/02/14 0118 03/03/14 1050 03/04/14 0618  NA 141 141 142 145  K 4.1 3.9 3.8 3.8  CL 104 106 110 111  CO2 22 21 19 19   GLUCOSE 133* 107* 130* 142*  BUN 27* 24* 13 12  CREATININE 1.52* 1.28* 1.14* 1.26*  CALCIUM 10.6* 9.7 9.6 9.7   Liver Function Tests:  Recent Labs Lab 03/01/14 1239  AST 17  ALT 9  ALKPHOS 76  BILITOT 0.4  PROT 7.7  ALBUMIN 3.4*   No results  found for this basename: LIPASE,  AMYLASE,  in the last 168 hours No results found for this basename: AMMONIA,  in the last 168 hours CBC:  Recent Labs Lab 03/01/14 1239 03/02/14 0118 03/03/14 0745  WBC 9.8 8.4 11.0*  HGB 12.0 10.4* 9.8*  HCT 37.2 32.2* 29.7*  MCV 93.7 93.1 91.7  PLT 224 200 238   Cardiac Enzymes: No results found for this basename: CKTOTAL, CKMB, CKMBINDEX, TROPONINI,  in the last 168 hours BNP: BNP (last 3 results) No results found for this basename: PROBNP,  in the last 8760 hours CBG:  Recent Labs Lab 03/03/14 1124 03/03/14 1713 03/03/14 2044 03/04/14 0740 03/04/14 1136  GLUCAP 127* 170* 129* 121* 207*       Signed:  Annita Brod  [facility] 03/04/2014, 3:36 PM

## 2014-03-04 NOTE — Evaluation (Addendum)
Physical Therapy Evaluation Patient Details Name: Patricia [name] MRN: [id number] DOB: 1931/12/24 Today's Date: 03/04/2014   History of Present Illness  80 y.o. female with hx of DVT, kidney transplant, CAD, HTN presents with L leg pain, found to have DVT in upper leg.   Clinical Impression  *Pt admitted with **LLE DVT*. Pt currently with functional limitations due to the deficits listed below (see PT Problem List).  Pt will benefit from skilled PT to increase their independence and safety with mobility to allow discharge to the venue listed below.   **    Follow Up Recommendations Home health PT    Equipment Recommendations  None recommended by PT    Recommendations for Other Services       Precautions / Restrictions Precautions Precautions: Fall Precaution Comments: pt reports h/o "balance problems" for which she's had extensive inconclusive testing & therapy Restrictions Weight Bearing Restrictions: No      Mobility  Bed Mobility Overal bed mobility: Modified Independent             General bed mobility comments: used bedrail to pull up and to scoot to EOB  Transfers Overall transfer level: Needs assistance Equipment used: Rolling walker (2 wheeled) Transfers: Sit to/from Stand Sit to Stand: Min assist         General transfer comment: assist to power up  Ambulation/Gait Ambulation/Gait assistance: Min guard Ambulation Distance (Feet): 30 Feet Assistive device: Rolling walker (2 wheeled) Gait Pattern/deviations: Step-to pattern;Decreased step length - left   Gait velocity interpretation: Below normal speed for age/gender General Gait Details: min/guard due to h/o falls, pt had no LOB  Stairs            Wheelchair Mobility    Modified Rankin (Stroke Patients Only)       Balance Overall balance assessment: Needs assistance Sitting-balance support: Single extremity supported;Feet supported Sitting balance-Leahy Scale: Good       Standing  balance-Leahy Scale: Poor Standing balance comment: requires BUE support in standing                             Pertinent Vitals/Pain **4/10 L thigh in standing Relieved with rest*    Home Living Family/patient expects to be discharged to:: Private residence Living Arrangements: Spouse/significant other Available Help at Discharge: Family;Available 24 hours/day Type of Home: House Home Access: Stairs to enter Entrance Stairs-Rails: Psychiatric nurse of Steps: 5 Home Layout: Multi-level;Able to live on main level with bedroom/bathroom Home Equipment: Walker - 4 wheels;Bedside commode;Shower seat;Grab bars - tub/shower;Transport chair      Prior Function Level of Independence: Needs assistance   Gait / Transfers Assistance Needed: occasional assist up from couch  ADL's / Homemaking Assistance Needed: assist to get into tub  Comments: husband assists with bathing/dressing, sits in shower to bathe, husband assists with stairs, she walks independently with rollator     Hand Dominance        Extremity/Trunk Assessment   Upper Extremity Assessment: Overall WFL for tasks assessed           Lower Extremity Assessment: Overall WFL for tasks assessed      Cervical / Trunk Assessment: Normal  Communication   Communication: No difficulties  Cognition Arousal/Alertness: Awake/alert Behavior During Therapy: WFL for tasks assessed/performed Overall Cognitive Status: Within Functional Limits for tasks assessed  General Comments      Exercises  Instructed pt in ankle pumps and hip abduction AROM for independent exercise. Pt return demonstrated understanding.      Assessment/Plan    PT Assessment Patient needs continued PT services  PT Diagnosis Difficulty walking;Generalized weakness   PT Problem List Decreased activity tolerance;Decreased balance;Decreased mobility;Pain  PT Treatment Interventions Gait  training;Stair training;Functional mobility training;Balance training;Therapeutic activities   PT Goals (Current goals can be found in the Care Plan section) Acute Rehab PT Goals Patient Stated Goal: going camping in RV, going shopping PT Goal Formulation: With patient Time For Goal Achievement: 03/18/14 Potential to Achieve Goals: Good    Frequency Min 3X/week   Barriers to discharge        Co-evaluation               End of Session   Activity Tolerance: Patient limited by fatigue Patient left: in chair;with call bell/phone within reach           Time: 0858-0924 PT Time Calculation (min): 26 min   Charges:   PT Evaluation $Initial PT Evaluation Tier I: 1 Procedure PT Treatments $Gait Training: 8-22 mins   PT G CodesLucile Crater 03/04/2014, 9:41 AM [phone]

## 2014-03-04 NOTE — Consult Note (Signed)
ANTICOAGULATION CONSULT NOTE - FOLLOW UP  Pharmacy Consult:  Lovenox and warfarin Indication: DVT treatment  Allergies  Allergen Reactions  . Aricept [Donepezil Hcl] Nausea And Vomiting  . Codeine Nausea And Vomiting  . Penicillins Other (See Comments)    Doesn't remember  . Statins Other (See Comments)    Doesn't remember  . Streptomycin Other (See Comments)    unknown  . Tetracyclines & Related Rash    Patient Measurements: Height: 5\' 4"  (162.6 cm) Weight: 169 lb 14.4 oz (77.066 kg) IBW/kg (Calculated) : 54.7  Vital Signs: Temp: 97.7 F (36.5 C) (05/21 0553) Temp src: Oral (05/21 0553) BP: 132/51 mmHg (05/21 0553) Pulse Rate: 61 (05/21 0553)  Labs:  Recent Labs  03/01/14 1239 03/02/14 0118 03/02/14 1013 03/03/14 0745 03/03/14 1050 03/04/14 0618  HGB 12.0 10.4*  --  9.8*  --   --   HCT 37.2 32.2*  --  29.7*  --   --   PLT 224 200  --  238  --   --   LABPROT 13.4  --   --   --   --  14.2  INR 1.04  --   --   --   --  1.12  HEPARINUNFRC  --  0.62 0.66  --   --   --   CREATININE 1.52* 1.28*  --   --  1.14* 1.26*    Estimated Creatinine Clearance: 36.4 ml/min (by C-G formula based on Cr of 1.26).  Assessment: 72 YOF with new LLE DVT who was on IV heparin and now has transitioned to Lovenox and warfarin for outpatient treatment. Spoke with patient yesterday regarding diet and Septra LOT (see yesterday's note). INR this morning 1.12. Hgb 9.8, plts WNL. No bleeding noted.  Goal of Therapy:  INR 2-3 Anti-Xa level 0.6-1 units/ml 4hrs after LMWH dose given Monitor platelets by anticoagulation protocol: Yes   Plan:  1. Lovenox 120mg  subq Q24h 2. Warfarin 7.5mg  po x1 tonight. Upon discharge, recommend 5mg  daily until she has INR checked (needs to be done by Tuesday 5/26. If she can get to a clinic on 5/25 [facility] Day Monday], that is also acceptable) 3. daily PT/INR and q72 CBC while in hospital 4. Needs to be on Lovenox for 5 days minimum- INR must be  therapeutic for 24 hours before it can stop   Kelijah Towry D. Eloyse Causey, PharmD, BCPS Clinical Pharmacist Pager: [phone] 03/04/2014 8:44 AM

## 2014-06-30 ENCOUNTER — Other Ambulatory Visit: Payer: Self-pay | Admitting: Dermatology

## 2015-05-22 ENCOUNTER — Inpatient Hospital Stay (HOSPITAL_COMMUNITY)
Admission: EM | Admit: 2015-05-22 | Discharge: 2015-05-27 | DRG: 871 | Disposition: A | Discharge status: Skilled Nursing Facility | Payer: Medicare Other | Attending: Internal Medicine | Admitting: Internal Medicine

## 2015-05-22 ENCOUNTER — Encounter (HOSPITAL_COMMUNITY): Payer: Self-pay

## 2015-05-22 ENCOUNTER — Emergency Department (HOSPITAL_COMMUNITY): Payer: Medicare Other

## 2015-05-22 DIAGNOSIS — R32 Unspecified urinary incontinence: Secondary | ICD-10-CM | POA: Diagnosis present

## 2015-05-22 DIAGNOSIS — N390 Urinary tract infection, site not specified: Secondary | ICD-10-CM | POA: Diagnosis present

## 2015-05-22 DIAGNOSIS — I5031 Acute diastolic (congestive) heart failure: Secondary | ICD-10-CM | POA: Diagnosis not present

## 2015-05-22 DIAGNOSIS — I82409 Acute embolism and thrombosis of unspecified deep veins of unspecified lower extremity: Secondary | ICD-10-CM | POA: Diagnosis present

## 2015-05-22 DIAGNOSIS — A419 Sepsis, unspecified organism: Secondary | ICD-10-CM | POA: Diagnosis present

## 2015-05-22 DIAGNOSIS — I129 Hypertensive chronic kidney disease with stage 1 through stage 4 chronic kidney disease, or unspecified chronic kidney disease: Secondary | ICD-10-CM | POA: Diagnosis present

## 2015-05-22 DIAGNOSIS — J449 Chronic obstructive pulmonary disease, unspecified: Secondary | ICD-10-CM | POA: Diagnosis present

## 2015-05-22 DIAGNOSIS — F329 Major depressive disorder, single episode, unspecified: Secondary | ICD-10-CM | POA: Diagnosis present

## 2015-05-22 DIAGNOSIS — Z79899 Other long term (current) drug therapy: Secondary | ICD-10-CM

## 2015-05-22 DIAGNOSIS — I5033 Acute on chronic diastolic (congestive) heart failure: Secondary | ICD-10-CM | POA: Diagnosis not present

## 2015-05-22 DIAGNOSIS — N183 Chronic kidney disease, stage 3 unspecified: Secondary | ICD-10-CM | POA: Diagnosis present

## 2015-05-22 DIAGNOSIS — Z86718 Personal history of other venous thrombosis and embolism: Secondary | ICD-10-CM | POA: Diagnosis not present

## 2015-05-22 DIAGNOSIS — Z888 Allergy status to other drugs, medicaments and biological substances status: Secondary | ICD-10-CM

## 2015-05-22 DIAGNOSIS — E039 Hypothyroidism, unspecified: Secondary | ICD-10-CM | POA: Diagnosis present

## 2015-05-22 DIAGNOSIS — A4151 Sepsis due to Escherichia coli [E. coli]: Principal | ICD-10-CM | POA: Diagnosis present

## 2015-05-22 DIAGNOSIS — Z7952 Long term (current) use of systemic steroids: Secondary | ICD-10-CM | POA: Diagnosis not present

## 2015-05-22 DIAGNOSIS — Z880 Allergy status to penicillin: Secondary | ICD-10-CM

## 2015-05-22 DIAGNOSIS — Z7901 Long term (current) use of anticoagulants: Secondary | ICD-10-CM

## 2015-05-22 DIAGNOSIS — E038 Other specified hypothyroidism: Secondary | ICD-10-CM | POA: Diagnosis not present

## 2015-05-22 DIAGNOSIS — E876 Hypokalemia: Secondary | ICD-10-CM | POA: Diagnosis present

## 2015-05-22 DIAGNOSIS — F419 Anxiety disorder, unspecified: Secondary | ICD-10-CM | POA: Diagnosis present

## 2015-05-22 DIAGNOSIS — I10 Essential (primary) hypertension: Secondary | ICD-10-CM | POA: Diagnosis present

## 2015-05-22 DIAGNOSIS — E872 Acidosis: Secondary | ICD-10-CM | POA: Diagnosis not present

## 2015-05-22 DIAGNOSIS — I824Y9 Acute embolism and thrombosis of unspecified deep veins of unspecified proximal lower extremity: Secondary | ICD-10-CM | POA: Diagnosis not present

## 2015-05-22 DIAGNOSIS — Z881 Allergy status to other antibiotic agents status: Secondary | ICD-10-CM

## 2015-05-22 DIAGNOSIS — N184 Chronic kidney disease, stage 4 (severe): Secondary | ICD-10-CM | POA: Diagnosis present

## 2015-05-22 DIAGNOSIS — J9601 Acute respiratory failure with hypoxia: Secondary | ICD-10-CM | POA: Diagnosis not present

## 2015-05-22 DIAGNOSIS — J439 Emphysema, unspecified: Secondary | ICD-10-CM | POA: Diagnosis not present

## 2015-05-22 DIAGNOSIS — Z87440 Personal history of urinary (tract) infections: Secondary | ICD-10-CM

## 2015-05-22 DIAGNOSIS — Z885 Allergy status to narcotic agent status: Secondary | ICD-10-CM | POA: Diagnosis not present

## 2015-05-22 DIAGNOSIS — B9620 Unspecified Escherichia coli [E. coli] as the cause of diseases classified elsewhere: Secondary | ICD-10-CM | POA: Diagnosis present

## 2015-05-22 DIAGNOSIS — I82402 Acute embolism and thrombosis of unspecified deep veins of left lower extremity: Secondary | ICD-10-CM | POA: Diagnosis not present

## 2015-05-22 DIAGNOSIS — N179 Acute kidney failure, unspecified: Secondary | ICD-10-CM | POA: Diagnosis present

## 2015-05-22 DIAGNOSIS — E1122 Type 2 diabetes mellitus with diabetic chronic kidney disease: Secondary | ICD-10-CM | POA: Diagnosis present

## 2015-05-22 DIAGNOSIS — J438 Other emphysema: Secondary | ICD-10-CM | POA: Diagnosis present

## 2015-05-22 DIAGNOSIS — I340 Nonrheumatic mitral (valve) insufficiency: Secondary | ICD-10-CM | POA: Diagnosis not present

## 2015-05-22 DIAGNOSIS — R0902 Hypoxemia: Secondary | ICD-10-CM

## 2015-05-22 DIAGNOSIS — Z940 Kidney transplant status: Secondary | ICD-10-CM

## 2015-05-22 DIAGNOSIS — N3000 Acute cystitis without hematuria: Secondary | ICD-10-CM | POA: Diagnosis not present

## 2015-05-22 DIAGNOSIS — I2510 Atherosclerotic heart disease of native coronary artery without angina pectoris: Secondary | ICD-10-CM | POA: Diagnosis present

## 2015-05-22 DIAGNOSIS — R319 Hematuria, unspecified: Secondary | ICD-10-CM

## 2015-05-22 DIAGNOSIS — R0602 Shortness of breath: Secondary | ICD-10-CM

## 2015-05-22 DIAGNOSIS — K219 Gastro-esophageal reflux disease without esophagitis: Secondary | ICD-10-CM | POA: Diagnosis present

## 2015-05-22 LAB — COMPREHENSIVE METABOLIC PANEL
ALK PHOS: 51 U/L (ref 38–126)
ALT: 15 U/L (ref 14–54)
ANION GAP: 14 (ref 5–15)
AST: 36 U/L (ref 15–41)
Albumin: 3.2 g/dL — ABNORMAL LOW (ref 3.5–5.0)
BILIRUBIN TOTAL: 0.7 mg/dL (ref 0.3–1.2)
BUN: 23 mg/dL — AB (ref 6–20)
CHLORIDE: 108 mmol/L (ref 101–111)
CO2: 19 mmol/L — ABNORMAL LOW (ref 22–32)
Calcium: 9.4 mg/dL (ref 8.9–10.3)
Creatinine, Ser: 1.88 mg/dL — ABNORMAL HIGH (ref 0.44–1.00)
GFR calc Af Amer: 28 mL/min — ABNORMAL LOW (ref >60)
GFR calc non Af Amer: 24 mL/min — ABNORMAL LOW (ref >60)
Glucose, Bld: 144 mg/dL — ABNORMAL HIGH (ref 65–99)
Potassium: 3.4 mmol/L — ABNORMAL LOW (ref 3.5–5.1)
Sodium: 141 mmol/L (ref 135–145)
Total Protein: 6 g/dL — ABNORMAL LOW (ref 6.5–8.1)

## 2015-05-22 LAB — BASIC METABOLIC PANEL
ANION GAP: 10 (ref 5–15)
BUN: 19 mg/dL (ref 6–20)
CO2: 18 mmol/L — AB (ref 22–32)
Calcium: 8.3 mg/dL — ABNORMAL LOW (ref 8.9–10.3)
Chloride: 114 mmol/L — ABNORMAL HIGH (ref 101–111)
Creatinine, Ser: 1.66 mg/dL — ABNORMAL HIGH (ref 0.44–1.00)
GFR calc Af Amer: 32 mL/min — ABNORMAL LOW (ref >60)
GFR calc non Af Amer: 28 mL/min — ABNORMAL LOW (ref >60)
Glucose, Bld: 144 mg/dL — ABNORMAL HIGH (ref 65–99)
Potassium: 3.1 mmol/L — ABNORMAL LOW (ref 3.5–5.1)
SODIUM: 142 mmol/L (ref 135–145)

## 2015-05-22 LAB — URINALYSIS, ROUTINE W REFLEX MICROSCOPIC
Bilirubin Urine: NEGATIVE
Glucose, UA: NEGATIVE mg/dL
Ketones, ur: NEGATIVE mg/dL
Nitrite: POSITIVE — AB
PROTEIN: 100 mg/dL — AB
Specific Gravity, Urine: 1.01 (ref 1.005–1.030)
Urobilinogen, UA: 0.2 mg/dL (ref 0.0–1.0)
pH: 6 (ref 5.0–8.0)

## 2015-05-22 LAB — CBC WITH DIFFERENTIAL/PLATELET
Basophils Absolute: 0 10*3/uL (ref 0.0–0.1)
Basophils Relative: 0 % (ref 0–1)
EOS PCT: 1 % (ref 0–5)
Eosinophils Absolute: 0.1 10*3/uL (ref 0.0–0.7)
HCT: 37.3 % (ref 36.0–46.0)
Hemoglobin: 12.4 g/dL (ref 12.0–15.0)
Lymphocytes Relative: 13 % (ref 12–46)
Lymphs Abs: 1.5 10*3/uL (ref 0.7–4.0)
MCH: 30.8 pg (ref 26.0–34.0)
MCHC: 33.2 g/dL (ref 30.0–36.0)
MCV: 92.6 fL (ref 78.0–100.0)
Monocytes Absolute: 0.9 10*3/uL (ref 0.1–1.0)
Monocytes Relative: 8 % (ref 3–12)
Neutro Abs: 9 10*3/uL — ABNORMAL HIGH (ref 1.7–7.7)
Neutrophils Relative %: 78 % — ABNORMAL HIGH (ref 43–77)
Platelets: 192 10*3/uL (ref 150–400)
RBC: 4.03 MIL/uL (ref 3.87–5.11)
RDW: 15.1 % (ref 11.5–15.5)
WBC: 11.6 10*3/uL — ABNORMAL HIGH (ref 4.0–10.5)

## 2015-05-22 LAB — I-STAT CG4 LACTIC ACID, ED
LACTIC ACID, VENOUS: 2.86 mmol/L — AB (ref 0.5–2.0)
LACTIC ACID, VENOUS: 2.68 mmol/L — AB (ref 0.5–2.0)

## 2015-05-22 LAB — URINE MICROSCOPIC-ADD ON

## 2015-05-22 LAB — APTT: aPTT: 28 seconds (ref 24–37)

## 2015-05-22 LAB — PROCALCITONIN: PROCALCITONIN: 1.38 ng/mL

## 2015-05-22 LAB — LACTIC ACID, PLASMA: LACTIC ACID, VENOUS: 1.5 mmol/L (ref 0.5–2.0)

## 2015-05-22 LAB — PROTIME-INR
INR: 1.45 (ref 0.00–1.49)
Prothrombin Time: 17.7 seconds — ABNORMAL HIGH (ref 11.6–15.2)

## 2015-05-22 LAB — CBC
HCT: 35.5 % — ABNORMAL LOW (ref 36.0–46.0)
Hemoglobin: 11.4 g/dL — ABNORMAL LOW (ref 12.0–15.0)
MCH: 29.9 pg (ref 26.0–34.0)
MCHC: 32.1 g/dL (ref 30.0–36.0)
MCV: 93.2 fL (ref 78.0–100.0)
Platelets: 171 10*3/uL (ref 150–400)
RBC: 3.81 MIL/uL — AB (ref 3.87–5.11)
RDW: 15.2 % (ref 11.5–15.5)
WBC: 10.8 10*3/uL — ABNORMAL HIGH (ref 4.0–10.5)

## 2015-05-22 LAB — MRSA PCR SCREENING: MRSA by PCR: NEGATIVE

## 2015-05-22 MED ORDER — LEVOFLOXACIN IN D5W 750 MG/150ML IV SOLN
750.0000 mg | Freq: Once | INTRAVENOUS | Status: AC
  Administered 2015-05-22: 750 mg via INTRAVENOUS
  Filled 2015-05-22: qty 150

## 2015-05-22 MED ORDER — SODIUM CHLORIDE 0.9 % IV SOLN
INTRAVENOUS | Status: DC
  Administered 2015-05-22 – 2015-05-24 (×5): via INTRAVENOUS

## 2015-05-22 MED ORDER — MIRABEGRON ER 25 MG PO TB24
25.0000 mg | ORAL_TABLET | Freq: Every evening | ORAL | Status: DC
  Administered 2015-05-23 – 2015-05-26 (×4): 25 mg via ORAL
  Filled 2015-05-22 (×8): qty 1

## 2015-05-22 MED ORDER — WARFARIN - PHARMACIST DOSING INPATIENT
Freq: Every day | Status: DC
  Administered 2015-05-25 – 2015-05-26 (×2)

## 2015-05-22 MED ORDER — VANCOMYCIN HCL IN DEXTROSE 1-5 GM/200ML-% IV SOLN
1000.0000 mg | Freq: Once | INTRAVENOUS | Status: AC
  Administered 2015-05-22: 1000 mg via INTRAVENOUS
  Filled 2015-05-22: qty 200

## 2015-05-22 MED ORDER — BUDESONIDE-FORMOTEROL FUMARATE 160-4.5 MCG/ACT IN AERO
2.0000 | INHALATION_SPRAY | Freq: Every day | RESPIRATORY_TRACT | Status: DC
  Administered 2015-05-22 – 2015-05-27 (×5): 2 via RESPIRATORY_TRACT
  Filled 2015-05-22 (×2): qty 6

## 2015-05-22 MED ORDER — METOPROLOL SUCCINATE ER 100 MG PO TB24
100.0000 mg | ORAL_TABLET | Freq: Every day | ORAL | Status: DC
  Administered 2015-05-23 – 2015-05-27 (×5): 100 mg via ORAL
  Filled 2015-05-22 (×6): qty 1

## 2015-05-22 MED ORDER — ALBUTEROL SULFATE (2.5 MG/3ML) 0.083% IN NEBU: 2.5000 mg | INHALATION_SOLUTION | Freq: Four times a day (QID) | RESPIRATORY_TRACT | Status: DC | PRN

## 2015-05-22 MED ORDER — RISAQUAD PO CAPS
1.0000 | ORAL_CAPSULE | Freq: Every day | ORAL | Status: DC
  Administered 2015-05-22 – 2015-05-27 (×6): 1 via ORAL
  Filled 2015-05-22 (×6): qty 1

## 2015-05-22 MED ORDER — CLONIDINE HCL 0.1 MG PO TABS
0.1000 mg | ORAL_TABLET | Freq: Two times a day (BID) | ORAL | Status: DC
  Administered 2015-05-22 – 2015-05-27 (×10): 0.1 mg via ORAL
  Filled 2015-05-22 (×13): qty 1

## 2015-05-22 MED ORDER — PREDNISONE 5 MG PO TABS
5.0000 mg | ORAL_TABLET | Freq: Every day | ORAL | Status: DC
  Administered 2015-05-22 – 2015-05-27 (×6): 5 mg via ORAL
  Filled 2015-05-22 (×7): qty 1

## 2015-05-22 MED ORDER — DARIFENACIN HYDROBROMIDE ER 7.5 MG PO TB24
7.5000 mg | ORAL_TABLET | Freq: Every morning | ORAL | Status: DC
  Administered 2015-05-22 – 2015-05-27 (×6): 7.5 mg via ORAL
  Filled 2015-05-22 (×7): qty 1

## 2015-05-22 MED ORDER — VANCOMYCIN HCL IN DEXTROSE 750-5 MG/150ML-% IV SOLN
750.0000 mg | INTRAVENOUS | Status: DC
  Administered 2015-05-23 – 2015-05-24 (×2): 750 mg via INTRAVENOUS
  Filled 2015-05-22 (×2): qty 150

## 2015-05-22 MED ORDER — SODIUM CHLORIDE 0.9 % IV BOLUS (SEPSIS)
500.0000 mL | INTRAVENOUS | Status: AC
  Administered 2015-05-22: 500 mL via INTRAVENOUS

## 2015-05-22 MED ORDER — ONDANSETRON HCL 4 MG/2ML IJ SOLN
4.0000 mg | Freq: Four times a day (QID) | INTRAMUSCULAR | Status: DC | PRN
  Administered 2015-05-22 – 2015-05-24 (×2): 4 mg via INTRAVENOUS
  Filled 2015-05-22 (×2): qty 2

## 2015-05-22 MED ORDER — LORAZEPAM 0.5 MG PO TABS
0.5000 mg | ORAL_TABLET | Freq: Once | ORAL | Status: AC
  Administered 2015-05-22: 0.5 mg via ORAL
  Filled 2015-05-22: qty 1

## 2015-05-22 MED ORDER — ALBUTEROL SULFATE HFA 108 (90 BASE) MCG/ACT IN AERS: 2.0000 | INHALATION_SPRAY | Freq: Four times a day (QID) | RESPIRATORY_TRACT | Status: DC | PRN

## 2015-05-22 MED ORDER — PANTOPRAZOLE SODIUM 40 MG PO TBEC
40.0000 mg | DELAYED_RELEASE_TABLET | Freq: Every day | ORAL | Status: DC
  Administered 2015-05-22 – 2015-05-27 (×6): 40 mg via ORAL
  Filled 2015-05-22 (×6): qty 1

## 2015-05-22 MED ORDER — SODIUM CHLORIDE 0.9 % IV SOLN
1.0000 g | INTRAVENOUS | Status: DC
  Administered 2015-05-22 – 2015-05-26 (×5): 1 g via INTRAVENOUS
  Filled 2015-05-22 (×7): qty 1

## 2015-05-22 MED ORDER — ONDANSETRON HCL 4 MG PO TABS: 4.0000 mg | ORAL_TABLET | Freq: Four times a day (QID) | ORAL | Status: DC | PRN

## 2015-05-22 MED ORDER — OXYBUTYNIN CHLORIDE ER 10 MG PO TB24
10.0000 mg | ORAL_TABLET | Freq: Every day | ORAL | Status: DC
  Administered 2015-05-22 – 2015-05-26 (×5): 10 mg via ORAL
  Filled 2015-05-22 (×7): qty 1

## 2015-05-22 MED ORDER — AZATHIOPRINE 50 MG PO TABS
50.0000 mg | ORAL_TABLET | Freq: Two times a day (BID) | ORAL | Status: DC
  Administered 2015-05-22 – 2015-05-27 (×11): 50 mg via ORAL
  Filled 2015-05-22 (×12): qty 1

## 2015-05-22 MED ORDER — ALUM & MAG HYDROXIDE-SIMETH 200-200-20 MG/5ML PO SUSP: 30.0000 mL | Freq: Four times a day (QID) | ORAL | Status: DC | PRN

## 2015-05-22 MED ORDER — ACETAMINOPHEN 325 MG PO TABS
650.0000 mg | ORAL_TABLET | Freq: Four times a day (QID) | ORAL | Status: DC | PRN
  Administered 2015-05-22 – 2015-05-26 (×2): 650 mg via ORAL
  Filled 2015-05-22 (×2): qty 2

## 2015-05-22 MED ORDER — AMLODIPINE BESYLATE 5 MG PO TABS
5.0000 mg | ORAL_TABLET | Freq: Every day | ORAL | Status: DC
  Administered 2015-05-23 – 2015-05-27 (×5): 5 mg via ORAL
  Filled 2015-05-22 (×6): qty 1

## 2015-05-22 MED ORDER — CETYLPYRIDINIUM CHLORIDE 0.05 % MT LIQD
7.0000 mL | Freq: Two times a day (BID) | OROMUCOSAL | Status: DC
  Administered 2015-05-22 – 2015-05-26 (×9): 7 mL via OROMUCOSAL

## 2015-05-22 MED ORDER — WARFARIN SODIUM 5 MG PO TABS
5.0000 mg | ORAL_TABLET | Freq: Once | ORAL | Status: AC
  Administered 2015-05-22: 5 mg via ORAL
  Filled 2015-05-22 (×2): qty 1

## 2015-05-22 MED ORDER — SODIUM CHLORIDE 0.9 % IV BOLUS (SEPSIS)
1000.0000 mL | INTRAVENOUS | Status: AC
  Administered 2015-05-22 (×2): 1000 mL via INTRAVENOUS

## 2015-05-22 MED ORDER — ACETAMINOPHEN 650 MG RE SUPP: 650.0000 mg | Freq: Four times a day (QID) | RECTAL | Status: DC | PRN

## 2015-05-22 MED ORDER — ONDANSETRON HCL 4 MG/2ML IJ SOLN
4.0000 mg | Freq: Once | INTRAMUSCULAR | Status: AC
  Administered 2015-05-22: 4 mg via INTRAVENOUS

## 2015-05-22 MED ORDER — FLUTICASONE PROPIONATE 50 MCG/ACT NA SUSP: 1.0000 | Freq: Every day | NASAL | Status: DC | PRN

## 2015-05-22 MED ORDER — CYCLOSPORINE MODIFIED (NEORAL) 25 MG PO CAPS
75.0000 mg | ORAL_CAPSULE | Freq: Three times a day (TID) | ORAL | Status: DC
Start: 2015-05-22 — End: 2015-05-27
  Administered 2015-05-22 – 2015-05-26 (×14): 75 mg via ORAL
  Filled 2015-05-22 (×21): qty 3

## 2015-05-22 MED ORDER — ACETAMINOPHEN 325 MG PO TABS
650.0000 mg | ORAL_TABLET | Freq: Once | ORAL | Status: AC | PRN
  Administered 2015-05-22: 650 mg via ORAL
  Filled 2015-05-22: qty 2

## 2015-05-22 MED ORDER — LORATADINE 10 MG PO TABS
10.0000 mg | ORAL_TABLET | Freq: Every day | ORAL | Status: DC | PRN
  Filled 2015-05-22: qty 1

## 2015-05-22 MED ORDER — DEXTROSE 5 % IV SOLN
2.0000 g | Freq: Once | INTRAVENOUS | Status: AC
  Administered 2015-05-22: 2 g via INTRAVENOUS
  Filled 2015-05-22: qty 2

## 2015-05-22 MED ORDER — LEVOTHYROXINE SODIUM 75 MCG PO TABS
75.0000 ug | ORAL_TABLET | Freq: Every day | ORAL | Status: DC
  Administered 2015-05-22 – 2015-05-27 (×6): 75 ug via ORAL
  Filled 2015-05-22 (×7): qty 1

## 2015-05-22 MED ORDER — ALIGN 4 MG PO CAPS: 1.0000 | ORAL_CAPSULE | Freq: Every day | ORAL | Status: DC

## 2015-05-22 NOTE — H&P (Addendum)
[facility] History and Physical  Patricia [name] [id number] DOB: 02-14-1932 DOA: 05/22/2015   PCP: [name], MD    Chief Complaint: chills  HPI: Patricia [name] is a 81 y.o. female with a renal transplant, DVT last year, hypothyroidism, frequent urinary tract infections. The patient told her husband that she was not feeling well this morning. They were on vacation and he decided to bring her home. Tonight she began to shake in the bed and he decided to call EMS. Upon arrival to the ER temperature was noted to be 103 she was found to have a positive UA. She was tachycardic and also quite confused initially. She is now alert and oriented and has no complaints. Her husband states that she had an episode of loose stool earlier today. No respiratory symptoms. She is going to be admitted for sepsis in relation to UTI.   General: The patient denies anorexia, fever, weight loss Cardiac: Denies chest pain, syncope, palpitations, pedal edema  Respiratory: Denies cough, shortness of breath, wheezing GI: Denies severe indigestion/heartburn, abdominal pain, nausea, vomiting, diarrhea and constipation GU: Denies hematuria or dysuria -  has urinary incontinence Musculoskeletal: Denies arthritis  Skin: Denies suspicious skin lesions Neurologic: Denies focal weakness or numbness, change in vision Psychiatry: Denies depression or anxiety. Hematologic: no easy bruising or bleeding  All other systems reviewed and found to be negative.  Past Medical History  Diagnosis Date  . Hx of kidney transplant     right  . Coronary artery disease   . Hypertension   . Multiple allergies   . Abnormal gait   . Reflux   . Diverticulitis   . Hypothyroidism   . Depression   . Anxiety   . COPD, mild   . Dizziness   . Diabetes   . DVT (deep venous thrombosis)     Past Surgical History  Procedure Laterality Date  . Nephrectomy transplanted organ    . Tumor removal      Brain      Social History: does not smoke cigarettes or drink alcohol Lives at at home with husband    Allergies  Allergen Reactions  . Aricept [Donepezil Hcl] Nausea And Vomiting  . Codeine Nausea And Vomiting  . Penicillins Other (See Comments)    Doesn't remember  . Statins Other (See Comments)    Doesn't remember  . Streptomycin Other (See Comments)    unknown  . Tetracyclines & Related Rash    Family history:   Family History  Problem Relation Age of Onset  . Alzheimer's disease Mother   . Alcoholism Father   . High Cholesterol Mother       Prior to Admission medications   Medication Sig Start Date End Date Taking? Authorizing Provider  albuterol (PROVENTIL HFA;VENTOLIN HFA) 108 (90 BASE) MCG/ACT inhaler Inhale 2 puffs into the lungs every 6 (six) hours as needed for wheezing or shortness of breath. Patient uses this medication prn.    Historical Provider, MD  amLODipine (NORVASC) 5 MG tablet Take 5 mg by mouth daily. 06/25/13   Historical Provider, MD  azaTHIOprine (IMURAN) 50 MG tablet Take 50 mg by mouth 2 (two) times daily.     Historical Provider, MD  budesonide-formoterol (SYMBICORT) 160-4.5 MCG/ACT inhaler Inhale 2 puffs into the lungs 2 (two) times daily.    Historical Provider, MD  cetirizine (ZYRTEC) 10 MG tablet Take 10 mg by mouth every morning.     Historical Provider, MD  Cholecalciferol (VITAMIN D) 2000 UNITS  tablet Take 2,000 Units by mouth every evening.     Historical Provider, MD  cloNIDine (CATAPRES) 0.1 MG tablet Take 0.1 mg by mouth 2 (two) times daily.    Historical Provider, MD  Coenzyme Q10 (CO Q 10) 100 MG CAPS Take 1 tablet by mouth every evening.     Historical Provider, MD  colesevelam (WELCHOL) 625 MG tablet Take 1,875 mg by mouth 2 (two) times daily with a meal.    Historical Provider, MD  ENABLEX 7.5 MG 24 hr tablet Take 7.5 mg by mouth every morning.  06/16/13   Historical Provider, MD  fenofibrate (TRICOR) 48 MG tablet Take 48 mg by mouth daily.     Historical Provider, MD  FLUoxetine (PROZAC) 20 MG tablet Take 20 mg by mouth daily.    Historical Provider, MD  fluticasone (FLONASE) 50 MCG/ACT nasal spray Place 1 spray into both nostrils daily as needed for allergies or rhinitis.    Historical Provider, MD  furosemide (LASIX) 40 MG tablet Take 40 mg by mouth 2 (two) times daily.     Historical Provider, MD  hyoscyamine (ANASPAZ) 0.125 MG TBDP Place 0.125 mg under the tongue every 6 (six) hours as needed for bladder spasms. Patient uses this medication prn.    Historical Provider, MD  L-Lysine 500 MG TABS Take 500 mg by mouth daily.    Historical Provider, MD  levothyroxine (SYNTHROID, LEVOTHROID) 75 MCG tablet Take 75 mcg by mouth daily.    Historical Provider, MD  metoprolol succinate (TOPROL-XL) 100 MG 24 hr tablet Take 100 mg by mouth daily. Take with or immediately following a meal.    Historical Provider, MD  mirabegron ER (MYRBETRIQ) 25 MG TB24 tablet Take 25 mg by mouth every evening.     Historical Provider, MD  Multiple Vitamin (MULTIVITAMIN) tablet Take 1 tablet by mouth daily.    Historical Provider, MD  omeprazole (PRILOSEC) 20 MG capsule Take 20 mg by mouth daily as needed (for heartburn).    Historical Provider, MD  ondansetron (ZOFRAN) 4 MG tablet Take 4 mg by mouth every 8 (eight) hours as needed for nausea or vomiting.    Historical Provider, MD  POTASSIUM CHLORIDE PO Take 300 mcg by mouth every evening.     Historical Provider, MD  predniSONE (DELTASONE) 5 MG tablet Take 5 mg by mouth daily with breakfast.    Historical Provider, MD  sulfamethoxazole-trimethoprim (BACTRIM,SEPTRA) 400-80 MG per tablet Take 1 tablet by mouth 3 (three) times a week. Monday, Wednesday, and Friday.    Historical Provider, MD  warfarin (COUMADIN) 5 MG tablet Take 5mg  nightly starting on Friday May 22nd 03/04/14   Annita Brod, MD     Physical Exam: Filed Vitals:   05/22/15 0051 05/22/15 0100 05/22/15 0200 05/22/15 0215  BP: 170/64 169/69  166/77   Pulse:  111 110   Temp:    99.1 F (37.3 C)  TempSrc:    Oral  Resp:  12 17   Weight:      SpO2:  98% 98%      General: Elderly lady laying in bed in no acute distress HEENT: Normocephalic and Atraumatic, Mucous membranes pink                PERRLA; EOM intact; No scleral icterus,                 Nares: Patent, Oropharynx: Clear, Fair Dentition  Neck: FROM, no cervical lymphadenopathy, thyromegaly, carotid bruit or JVD;  Breasts: deferred CHEST WALL: No tenderness  CHEST: Normal respiration, clear to auscultation bilaterally  HEART: Regular rate and rhythm; no murmurs rubs or gallops + tachycardia BACK: No kyphosis or scoliosis; no CVA tenderness  GI: Positive Bowel Sounds, soft, non-tender; no masses, no organomegaly Rectal Exam: deferred MSK: No cyanosis, clubbing, 2+ pitting edema bilaterally Genitalia: not examined  SKIN:  no rash or ulceration  CNS: Alert and Oriented x 4, Nonfocal exam, CN 2-12 intact  Labs on Admission:  Basic Metabolic Panel:  Recent Labs Lab 05/22/15 0100  NA 141  K 3.4*  CL 108  CO2 19*  GLUCOSE 144*  BUN 23*  CREATININE 1.88*  CALCIUM 9.4   Liver Function Tests:  Recent Labs Lab 05/22/15 0100  AST 36  ALT 15  ALKPHOS 51  BILITOT 0.7  PROT 6.0*  ALBUMIN 3.2*   No results for input(s): LIPASE, AMYLASE in the last 168 hours. No results for input(s): AMMONIA in the last 168 hours. CBC:  Recent Labs Lab 05/22/15 0100  WBC 11.6*  NEUTROABS 9.0*  HGB 12.4  HCT 37.3  MCV 92.6  PLT 192   Cardiac Enzymes: No results for input(s): CKTOTAL, CKMB, CKMBINDEX, TROPONINI in the last 168 hours.  BNP (last 3 results) No results for input(s): BNP in the last 8760 hours.  ProBNP (last 3 results) No results for input(s): PROBNP in the last 8760 hours.  CBG: No results for input(s): GLUCAP in the last 168 hours.  Radiological Exams on Admission: Dg Chest Portable 1 View  05/22/2015   CLINICAL DATA:  Sepsis   EXAM: PORTABLE CHEST - 1 VIEW  COMPARISON:  03/01/2014  FINDINGS: A single AP portable view of the chest demonstrates no focal airspace consolidation or alveolar edema. The lungs are grossly clear except for calcified granulomatous changes. There is no large effusion or pneumothorax. Cardiac and mediastinal contours appear unremarkable.  IMPRESSION: No active disease.   Electronically Signed   By: Andreas Newport M.D.   On: 05/22/2015 01:23    EKG: Independently reviewed. Sinus tachycardia  Assessment/Plan Principal Problem:   Urinary tract infection/ sepsis with fever tachycardia confusion, lactic acidosis and leukocytosis - Urine Culture in 2014 grew out Escherichia coli which was ESBL resistant- she is chronically on Bactrim 3 times a week -He has received vancomycin, Azactam and Levaquin in the ER-I will place her on vancomycin and ertapenem -Blood cultures and urine culture have been drawn -1300 mL of normal saline given in the ER-she is tachycardic but not hypotensive -Temperature of 103 has improved to 99 -Admit to step down unit  Active Problems:   History of renal transplant -Continue immunosuppressants  Acute renal insufficiency on CK D4 -IV fluids given-will order maintenance fluids-hold Lasix - if she is having good urine output, will need to consider stopping fluids in the morning    Hypertension -Continue Norvasc, clonidine, metoprolol with holding parameters    Hypothyroidism -Continue Synthroid    DVT (deep venous thrombosis) -Right leg last year --Continue Coumadin per pharmacy  Mild COPD --Continue Symbicort  Urinary incontinence -Continue Enablex and oxybutynin  Pedal edema --holding Lasix    Consulted:   Code Status: DO NOT INTUBATE  Family Communication: Husband at bedside  DVT Prophylaxis: Coumadin  Time spent: 9 minutes  [place], MD [facility]  If 7PM-7AM, please contact night-coverage [email or web address] 05/22/2015, 3:22 AM

## 2015-05-22 NOTE — Progress Notes (Signed)
ANTIBIOTIC CONSULT NOTE - INITIAL  Pharmacy Consult for Vancomycin  Indication: rule out sepsis, likely urinary source  Allergies  Allergen Reactions  . Aricept [Donepezil Hcl] Nausea And Vomiting  . Codeine Nausea And Vomiting  . Penicillins Other (See Comments)    Doesn't remember  . Statins Other (See Comments)    Doesn't remember  . Streptomycin Other (See Comments)    unknown  . Tetracyclines & Related Rash   Patient Measurements: Weight: 170 lb (77.111 kg)  Vital Signs: Temp: 99.1 F (37.3 C) (08/07 0215) Temp Source: Oral (08/07 0215) BP: 166/77 mmHg (08/07 0200) Pulse Rate: 110 (08/07 0200)  Labs:  Recent Labs  05/22/15 0100  WBC 11.6*  HGB 12.4  PLT 192  CREATININE 1.88*   Medical History: Past Medical History  Diagnosis Date  . Renal disorder   . Hx of kidney transplant     right  . Coronary artery disease   . Hypertension   . Thyroid disease   . Multiple allergies   . Abnormal gait   . Reflux   . Diverticulitis   . Hypothyroidism   . Depression   . Anxiety   . COPD, mild   . Dizziness   . Diabetes   . DVT (deep venous thrombosis)    Assessment: 81 y/o F with history of ESBL E Coli UTI. Starting vancomycin per pharmacy and ertapenem per MD. WBC 11.6. Noted renal dysfunction. U/A is abnormal.   Goal of Therapy:  Vancomycin trough level 15-20 mcg/ml  Plan:  -Vancomycin 750 mg IV q24h -Ertapenem per MD for ESBL coverage -F/U urine culture  Narda Bonds 05/22/2015,3:21 AM

## 2015-05-22 NOTE — Progress Notes (Signed)
Utilization Review Completed.  

## 2015-05-22 NOTE — ED Notes (Signed)
[phone] (home) [phone] (cell), pt's husband

## 2015-05-22 NOTE — Progress Notes (Signed)
Report given to Mickel Baas on [facility].  Pt has no c/o pain or s/s of any acute distress.  Husband aware of transfer.

## 2015-05-22 NOTE — Progress Notes (Signed)
ANTICOAGULATION CONSULT NOTE - Initial Consult  Pharmacy Consult for Warfarin  Indication: Hx DVT  Allergies  Allergen Reactions  . Aricept [Donepezil Hcl] Nausea And Vomiting  . Codeine Nausea And Vomiting  . Penicillins Other (See Comments)    Doesn't remember  . Statins Other (See Comments)    Doesn't remember  . Streptomycin Other (See Comments)    unknown  . Tetracyclines & Related Rash   Patient Measurements: Weight: 170 lb (77.111 kg)  Vital Signs: Temp: 99.1 F (37.3 C) (08/07 0215) Temp Source: Oral (08/07 0215) BP: 166/77 mmHg (08/07 0200) Pulse Rate: 110 (08/07 0200)  Labs:  Recent Labs  05/22/15 0100  HGB 12.4  HCT 37.3  PLT 192  LABPROT 17.7*  INR 1.45  CREATININE 1.88*    CrCl cannot be calculated (Unknown ideal weight.).   Medical History: Past Medical History  Diagnosis Date  . Renal disorder   . Hx of kidney transplant     right  . Coronary artery disease   . Hypertension   . Thyroid disease   . Multiple allergies   . Abnormal gait   . Reflux   . Diverticulitis   . Hypothyroidism   . Depression   . Anxiety   . COPD, mild   . Dizziness   . Diabetes   . DVT (deep venous thrombosis)     Assessment: 81 y/o F on warfarin PTA for hx of DVT, INR on admit is sub-therapeutic at 1.45, CBC good, renal dysfunction noted, other labs reviewed.  Goal of Therapy:  INR 2-3 Monitor platelets by anticoagulation protocol: Yes   Plan:  -Warfarin 5 mg PO x 1 tonight at 1800 -Daily PT/INR, adjust dose as needed -Monitor for bleeding  Patricia [name] 05/22/2015,3:30 AM

## 2015-05-22 NOTE — ED Provider Notes (Signed)
TIME SEEN: 1:12 AM   CHIEF COMPLAINT: Fever  HPI: HPI Comments: Level 5 Caveat: Altered Mental Status  Patricia [name] is a 81 y.o. female with history of hypertension, COPD, diabetes, renal transplant at [facility] 10 years ago who is on immunosuppressants brought in by ambulance, who presents to the Emergency Department complaining of AMS and fever onset 2 days prior. Husband reports that she has had frequent UTIs in presents with similar symptoms. No cough. No vomiting or diarrhea. No head injury.   ROS: Level V caveat for altered mental status  PAST MEDICAL HISTORY/PAST SURGICAL HISTORY:  Past Medical History  Diagnosis Date  . Renal disorder   . Hx of kidney transplant     right  . Coronary artery disease   . Hypertension   . Thyroid disease   . Multiple allergies   . Abnormal gait   . Reflux   . Diverticulitis   . Hypothyroidism   . Depression   . Anxiety   . COPD, mild   . Dizziness   . Diabetes   . DVT (deep venous thrombosis)     MEDICATIONS:  Prior to Admission medications   Medication Sig Start Date End Date Taking? Authorizing Provider  albuterol (PROVENTIL HFA;VENTOLIN HFA) 108 (90 BASE) MCG/ACT inhaler Inhale 2 puffs into the lungs every 6 (six) hours as needed for wheezing or shortness of breath. Patient uses this medication prn.    Historical Provider, MD  amLODipine (NORVASC) 5 MG tablet Take 5 mg by mouth daily. 06/25/13   Historical Provider, MD  azaTHIOprine (IMURAN) 50 MG tablet Take 50 mg by mouth 2 (two) times daily.     Historical Provider, MD  budesonide-formoterol (SYMBICORT) 160-4.5 MCG/ACT inhaler Inhale 2 puffs into the lungs 2 (two) times daily.    Historical Provider, MD  cetirizine (ZYRTEC) 10 MG tablet Take 10 mg by mouth every morning.     Historical Provider, MD  Cholecalciferol (VITAMIN D) 2000 UNITS tablet Take 2,000 Units by mouth every evening.     Historical Provider, MD  cloNIDine (CATAPRES) 0.1 MG tablet Take 0.1 mg by mouth 2 (two)  times daily.    Historical Provider, MD  Coenzyme Q10 (CO Q 10) 100 MG CAPS Take 1 tablet by mouth every evening.     Historical Provider, MD  colesevelam (WELCHOL) 625 MG tablet Take 1,875 mg by mouth 2 (two) times daily with a meal.    Historical Provider, MD  cycloSPORINE (SANDIMMUNE) 100 MG capsule Take 100 mg by mouth 2 (two) times daily.    Historical Provider, MD  ENABLEX 7.5 MG 24 hr tablet Take 7.5 mg by mouth every morning.  06/16/13   Historical Provider, MD  enoxaparin (LOVENOX) 60 MG/0.6ML injection Inject 1.2 mLs (120 mg total) into the skin daily. 03/04/14   Annita Brod, MD  fenofibrate (TRICOR) 48 MG tablet Take 48 mg by mouth daily.    Historical Provider, MD  FLUoxetine (PROZAC) 20 MG tablet Take 20 mg by mouth daily.    Historical Provider, MD  fluticasone (FLONASE) 50 MCG/ACT nasal spray Place 1 spray into both nostrils daily as needed for allergies or rhinitis.    Historical Provider, MD  furosemide (LASIX) 40 MG tablet Take 40 mg by mouth 2 (two) times daily.     Historical Provider, MD  hyoscyamine (ANASPAZ) 0.125 MG TBDP Place 0.125 mg under the tongue every 6 (six) hours as needed for bladder spasms. Patient uses this medication prn.    Historical  Provider, MD  L-Lysine 500 MG TABS Take 500 mg by mouth daily.    Historical Provider, MD  levothyroxine (SYNTHROID, LEVOTHROID) 75 MCG tablet Take 75 mcg by mouth daily.    Historical Provider, MD  metoprolol succinate (TOPROL-XL) 100 MG 24 hr tablet Take 100 mg by mouth daily. Take with or immediately following a meal.    Historical Provider, MD  mirabegron ER (MYRBETRIQ) 25 MG TB24 tablet Take 25 mg by mouth every evening.     Historical Provider, MD  Multiple Vitamin (MULTIVITAMIN) tablet Take 1 tablet by mouth daily.    Historical Provider, MD  omeprazole (PRILOSEC) 20 MG capsule Take 20 mg by mouth daily as needed (for heartburn).    Historical Provider, MD  ondansetron (ZOFRAN) 4 MG tablet Take 4 mg by mouth every 8  (eight) hours as needed for nausea or vomiting.    Historical Provider, MD  POTASSIUM CHLORIDE PO Take 300 mcg by mouth every evening.     Historical Provider, MD  predniSONE (DELTASONE) 5 MG tablet Take 5 mg by mouth daily with breakfast.    Historical Provider, MD  sulfamethoxazole-trimethoprim (BACTRIM,SEPTRA) 400-80 MG per tablet Take 1 tablet by mouth 3 (three) times a week. Monday, Wednesday, and Friday.    Historical Provider, MD  warfarin (COUMADIN) 5 MG tablet Take 5mg  nightly starting on Friday May 22nd 03/04/14   Annita Brod, MD    ALLERGIES:  Allergies  Allergen Reactions  . Aricept [Donepezil Hcl] Nausea And Vomiting  . Codeine Nausea And Vomiting  . Penicillins Other (See Comments)    Doesn't remember  . Statins Other (See Comments)    Doesn't remember  . Streptomycin Other (See Comments)    unknown  . Tetracyclines & Related Rash    SOCIAL HISTORY:  History  Substance Use Topics  . Smoking status: Never Smoker   . Smokeless tobacco: Never Used  . Alcohol Use: No    FAMILY HISTORY: Family History  Problem Relation Age of Onset  . Alzheimer'Patricia disease Mother   . Alcoholism Father   . High Cholesterol Mother     EXAM: BP 170/64 mmHg  Pulse 107  Temp(Src) 103 F (39.4 C) (Rectal)  Resp 23  Wt 170 lb (77.111 kg)  SpO2 93% CONSTITUTIONAL: Alert and oriented x1 unable to answer questions. Patient is febrile but nontoxic, chronically ill-appearing, elderly HEAD: Normocephalic EYES: Conjunctivae clear, PERRL ENT: normal nose; no rhinorrhea; moist mucous membranes; pharynx without lesions noted NECK: Supple, no meningismus, no LAD  CARD: Regular and tachycardic; S1 and S2 appreciated; no murmurs, no clicks, no rubs, no gallops RESP: Normal chest excursion without splinting or tachypnea; breath sounds clear and equal bilaterally; no wheezes, no rhonchi, no rales, no hypoxia or respiratory distress, speaking full sentences ABD/GI: Normal bowel sounds;  non-distended; soft, non-tender, no rebound, no guarding, no peritoneal signs, RLQ well healed surgical scar from kidney replacement.  BACK:  The back appears normal and is non-tender to palpation, there is no CVA tenderness EXT: Normal ROM in all joints; non-tender to palpation; no edema; normal capillary refill; no cyanosis, no calf tenderness or swelling    SKIN: Normal color for age and race; warm NEURO: Moves all extremities equally, no facial droop, no dysarthria    EKG Interpretation  Date/Time:  Sunday May 22 2015 00:58:34 EDT Ventricular Rate:  111 PR Interval:  186 QRS Duration: 91 QT Interval:  349 QTC Calculation: 474 R Axis:   40 Text Interpretation:  Sinus tachycardia  Nonspecific repol abnormality, diffuse leads Confirmed by Rexine Gowens,  DO, Lashawndra Lampkins [phone]) on 05/22/2015 1:12:19 AM        MEDICAL DECISION MAKING: Patient here with possible sepsis. She is tachycardic, febrile but hemodynamically stable otherwise. Will obtain septic workup including labs, cultures, chest x-ray, urine. Will give broad-spectrum antibiotics, IV fluids.  ED PROGRESS: Patient has a mild leukocytosis with left shift. Lactate is elevated. Urine shows a nitrite-positive UTI. Chest x-ray clear. Patient has significant improvement with IV fluids and antibiotics. She is now able to answer questions and follow commands. Still not back to her baseline. Discussed with hospitalist for admission to step down. Patient'Patricia family updated with plan.    ED Sepsis - Repeat Assessment   Performed at:    May 22, 2015, 2:35 AM   Last Vitals:    Blood pressure 166/77, pulse 110, temperature 99.1 F (37.3 C), temperature source Oral, resp. rate 17, weight 170 lb (77.111 kg), SpO2 98 %.  Heart:      Regular rate and rhythm  Lungs:     Clear to auscultation bilaterally, no hypoxia  Capillary Refill:   Less than 2 seconds  Peripheral Pulse (include location): 2+ radial pulses bilaterally   Skin (include  color):   Warm, pink, well-perfused       CRITICAL CARE Performed by: Nyra Jabs   Total critical care time: 45 minutes  Critical care time was exclusive of separately billable procedures and treating other patients.  Critical care was necessary to treat or prevent imminent or life-threatening deterioration.  Critical care was time spent personally by me on the following activities: development of treatment plan with patient and/or surrogate as well as nursing, discussions with consultants, evaluation of patient'Patricia response to treatment, examination of patient, obtaining history from patient or surrogate, ordering and performing treatments and interventions, ordering and review of laboratory studies, ordering and review of radiographic studies, pulse oximetry and re-evaluation of patient'Patricia condition.   I personally performed the services described in this documentation, which was scribed in my presence. The recorded information has been reviewed and is accurate.    Tripp, DO 05/22/15 [phone]

## 2015-05-22 NOTE — ED Notes (Addendum)
Pt has not been feeling well for the last couple of days, noted a fever today with AMS, pt oriented to person, is unable to answer questions about place or time.  Pt has had some n/v/d, hx of chronic UTI and kidney transplant.  Pt received 559ml normal saline and 4mg  zofran en route

## 2015-05-22 NOTE — Progress Notes (Signed)
[facility] PROGRESS NOTE  Patricia [name] [id number] DOB: 1932/07/29 DOA: 05/22/2015 PCP: [name], MD HPI/Subjective: Patricia [name] is a 81 y.o. WF PMHx  renal transplant, DVT last year, hypothyroidism, frequent urinary tract infections.  The patient told her husband that she was not feeling well this morning. They were on vacation and he decided to bring her home. Tonight she began to shake in the bed and he decided to call EMS. Upon arrival to the ER temperature was noted to be 103 she was found to have a positive UA. She was tachycardic and also quite confused initially. She is now alert and oriented and has no complaints. Her husband states that she had an episode of loose stool earlier today. No respiratory symptoms. She is going to be admitted for sepsis in relation to UTI.     Assessment/Plan: Patient was seen this a.m. by [facility] hospitalist and admitted. Patient's only complaint is of pedal edema. Pedal edema -Patient with no cardiologist states unknown dry weight.  CHF? -Obtain echocardiogram  Sepsis secondary to UTI  -Per admission note   Objective: Filed Vitals:   05/22/15 1200 05/22/15 1300 05/22/15 1400 05/22/15 1509  BP: 135/57 133/58 142/100 123/56  Pulse: 90 90 91 79  Temp: 99.2 F (37.3 C)   98.7 F (37.1 C)  TempSrc: Oral   Oral  Resp: 18 16 24    Height:      Weight:      SpO2: 95% 98% 96% 95%    Intake/Output Summary (Last 24 hours) at 05/22/15 1955 Last data filed at 05/22/15 1400  Gross per 24 hour  Intake 971.67 ml  Output    150 ml  Net 821.67 ml   Filed Weights   05/22/15 0050 05/22/15 0510  Weight: 77.111 kg (170 lb) 77.1 kg (169 lb 15.6 oz)     Exam: General: No acute respiratory distress Lungs: Clear to auscultation bilaterally without wheezes or crackles Cardiovascular: Regular rate and rhythm without murmur gallop or rub normal S1 and S2 Abdomen:negative abdominal pain, negative dysphagia, nondistended,  positive soft, bowel sounds, no rebound, no ascites, no appreciable mass, bilateral CVA tenderness Lt>>Rt Extremities: No significant cyanosis, clubbing, or edema bilateral lower extremities 2+   Data Reviewed: Basic Metabolic Panel:  Recent Labs Lab 05/22/15 0100 05/22/15 0350  NA 141 142  K 3.4* 3.1*  CL 108 114*  CO2 19* 18*  GLUCOSE 144* 144*  BUN 23* 19  CREATININE 1.88* 1.66*  CALCIUM 9.4 8.3*   Liver Function Tests:  Recent Labs Lab 05/22/15 0100  AST 36  ALT 15  ALKPHOS 51  BILITOT 0.7  PROT 6.0*  ALBUMIN 3.2*   No results for input(s): LIPASE, AMYLASE in the last 168 hours. No results for input(s): AMMONIA in the last 168 hours. CBC:  Recent Labs Lab 05/22/15 0100 05/22/15 0350  WBC 11.6* 10.8*  NEUTROABS 9.0*  --   HGB 12.4 11.4*  HCT 37.3 35.5*  MCV 92.6 93.2  PLT 192 171   Cardiac Enzymes: No results for input(s): CKTOTAL, CKMB, CKMBINDEX, TROPONINI in the last 168 hours. BNP (last 3 results) No results for input(s): BNP in the last 8760 hours.  ProBNP (last 3 results) No results for input(s): PROBNP in the last 8760 hours.  CBG: No results for input(s): GLUCAP in the last 168 hours.  Recent Results (from the past 240 hour(s))  Culture, blood (routine x 2)     Status: None (Preliminary result)   Collection Time: 05/22/15  1:16 AM  Result Value Ref Range Status   Specimen Description BLOOD RIGHT ANTECUBITAL  Final   Special Requests BOTTLES DRAWN AEROBIC AND ANAEROBIC 10CC  Final   Culture PENDING  Incomplete   Report Status PENDING  Incomplete  MRSA PCR Screening     Status: None   Collection Time: 05/22/15  5:16 AM  Result Value Ref Range Status   MRSA by PCR NEGATIVE NEGATIVE Final    Comment:        The GeneXpert MRSA Assay (FDA approved for NASAL specimens only), is one component of a comprehensive MRSA colonization surveillance program. It is not intended to diagnose MRSA infection nor to guide or monitor treatment  for MRSA infections.      Studies: Dg Chest Portable 1 View  05/22/2015   CLINICAL DATA:  Sepsis  EXAM: PORTABLE CHEST - 1 VIEW  COMPARISON:  03/01/2014  FINDINGS: A single AP portable view of the chest demonstrates no focal airspace consolidation or alveolar edema. The lungs are grossly clear except for calcified granulomatous changes. There is no large effusion or pneumothorax. Cardiac and mediastinal contours appear unremarkable.  IMPRESSION: No active disease.   Electronically Signed   By: Andreas Newport M.D.   On: 05/22/2015 01:23    Scheduled Meds: . acidophilus  1 capsule Oral Daily  . amLODipine  5 mg Oral Daily  . antiseptic oral rinse  7 mL Mouth Rinse BID  . azaTHIOprine  50 mg Oral BID  . budesonide-formoterol  2 puff Inhalation Daily  . cloNIDine  0.1 mg Oral BID  . cycloSPORINE modified  75 mg Oral TID  . darifenacin  7.5 mg Oral q morning - 10a  . ertapenem  1 g Intravenous Q24H  . levothyroxine  75 mcg Oral QAC breakfast  . metoprolol succinate  100 mg Oral Daily  . mirabegron ER  25 mg Oral QPM  . oxybutynin  10 mg Oral QHS  . pantoprazole  40 mg Oral Daily  . predniSONE  5 mg Oral Q breakfast  . [START ON 05/23/2015] vancomycin  750 mg Intravenous Q24H  . Warfarin - Pharmacist Dosing Inpatient   Does not apply q1800   Continuous Infusions: . sodium chloride 100 mL/hr at 05/22/15 1515    Principal Problem:   Sepsis secondary to UTI Active Problems:   History of renal transplant   Hypertension   Hypothyroidism   Urinary tract infection   Sepsis   DVT (deep venous thrombosis)   AKI (acute kidney injury)   CKD (chronic kidney disease) stage 3, GFR 30-59 ml/min    Time spent: [place], [place] [facility] Pager [phone]. If 7PM-7AM, please contact night-coverage at [email or web address], password [facility] 05/22/2015, 7:55 PM  LOS: 0 days    Care during the described time interval was provided by me .  I have reviewed this patient's available data,  including medical history, events of note, physical examination, and all test results as part of my evaluation. I have personally reviewed and interpreted all radiology studies.   Dia Crawford, MD [phone] Pager

## 2015-05-23 ENCOUNTER — Ambulatory Visit (HOSPITAL_COMMUNITY): Payer: Medicare Other | Attending: Internal Medicine

## 2015-05-23 DIAGNOSIS — I82402 Acute embolism and thrombosis of unspecified deep veins of left lower extremity: Secondary | ICD-10-CM

## 2015-05-23 DIAGNOSIS — E876 Hypokalemia: Secondary | ICD-10-CM

## 2015-05-23 DIAGNOSIS — E038 Other specified hypothyroidism: Secondary | ICD-10-CM

## 2015-05-23 DIAGNOSIS — I340 Nonrheumatic mitral (valve) insufficiency: Secondary | ICD-10-CM | POA: Insufficient documentation

## 2015-05-23 DIAGNOSIS — I10 Essential (primary) hypertension: Secondary | ICD-10-CM

## 2015-05-23 DIAGNOSIS — J438 Other emphysema: Secondary | ICD-10-CM

## 2015-05-23 DIAGNOSIS — I517 Cardiomegaly: Secondary | ICD-10-CM | POA: Insufficient documentation

## 2015-05-23 LAB — PROTIME-INR
INR: 1.66 — AB (ref 0.00–1.49)
Prothrombin Time: 19.6 seconds — ABNORMAL HIGH (ref 11.6–15.2)

## 2015-05-23 LAB — COMPREHENSIVE METABOLIC PANEL
ALK PHOS: 40 U/L (ref 38–126)
ALT: 17 U/L (ref 14–54)
AST: 29 U/L (ref 15–41)
Albumin: 2.6 g/dL — ABNORMAL LOW (ref 3.5–5.0)
Anion gap: 10 (ref 5–15)
BILIRUBIN TOTAL: 0.8 mg/dL (ref 0.3–1.2)
BUN: 13 mg/dL (ref 6–20)
CO2: 19 mmol/L — AB (ref 22–32)
Calcium: 8.5 mg/dL — ABNORMAL LOW (ref 8.9–10.3)
Chloride: 114 mmol/L — ABNORMAL HIGH (ref 101–111)
Creatinine, Ser: 1.47 mg/dL — ABNORMAL HIGH (ref 0.44–1.00)
GFR calc Af Amer: 37 mL/min — ABNORMAL LOW (ref >60)
GFR calc non Af Amer: 32 mL/min — ABNORMAL LOW (ref >60)
GLUCOSE: 109 mg/dL — AB (ref 65–99)
Potassium: 3.4 mmol/L — ABNORMAL LOW (ref 3.5–5.1)
Sodium: 143 mmol/L (ref 135–145)
Total Protein: 5.4 g/dL — ABNORMAL LOW (ref 6.5–8.1)

## 2015-05-23 LAB — CBC WITH DIFFERENTIAL/PLATELET
BASOS ABS: 0 10*3/uL (ref 0.0–0.1)
BASOS PCT: 0 % (ref 0–1)
EOS PCT: 2 % (ref 0–5)
Eosinophils Absolute: 0.1 10*3/uL (ref 0.0–0.7)
HCT: 33.4 % — ABNORMAL LOW (ref 36.0–46.0)
Hemoglobin: 10.6 g/dL — ABNORMAL LOW (ref 12.0–15.0)
Lymphocytes Relative: 14 % (ref 12–46)
Lymphs Abs: 1 10*3/uL (ref 0.7–4.0)
MCH: 30.1 pg (ref 26.0–34.0)
MCHC: 31.7 g/dL (ref 30.0–36.0)
MCV: 94.9 fL (ref 78.0–100.0)
MONO ABS: 0.5 10*3/uL (ref 0.1–1.0)
Monocytes Relative: 7 % (ref 3–12)
NEUTROS ABS: 5.7 10*3/uL (ref 1.7–7.7)
Neutrophils Relative %: 77 % (ref 43–77)
Platelets: 162 10*3/uL (ref 150–400)
RBC: 3.52 MIL/uL — ABNORMAL LOW (ref 3.87–5.11)
RDW: 15.7 % — ABNORMAL HIGH (ref 11.5–15.5)
WBC: 7.4 10*3/uL (ref 4.0–10.5)

## 2015-05-23 LAB — MAGNESIUM: Magnesium: 1.6 mg/dL — ABNORMAL LOW (ref 1.7–2.4)

## 2015-05-23 MED ORDER — MAGNESIUM OXIDE 400 (241.3 MG) MG PO TABS
400.0000 mg | ORAL_TABLET | Freq: Once | ORAL | Status: AC
  Administered 2015-05-23: 400 mg via ORAL
  Filled 2015-05-23: qty 1

## 2015-05-23 MED ORDER — WARFARIN SODIUM 5 MG PO TABS
5.0000 mg | ORAL_TABLET | Freq: Once | ORAL | Status: AC
  Administered 2015-05-23: 5 mg via ORAL
  Filled 2015-05-23: qty 1

## 2015-05-23 MED ORDER — POTASSIUM CHLORIDE CRYS ER 20 MEQ PO TBCR
40.0000 meq | EXTENDED_RELEASE_TABLET | Freq: Once | ORAL | Status: AC
  Administered 2015-05-23: 40 meq via ORAL
  Filled 2015-05-23: qty 2

## 2015-05-23 MED ORDER — IPRATROPIUM-ALBUTEROL 0.5-2.5 (3) MG/3ML IN SOLN
3.0000 mL | Freq: Three times a day (TID) | RESPIRATORY_TRACT | Status: DC
  Administered 2015-05-23 – 2015-05-27 (×11): 3 mL via RESPIRATORY_TRACT
  Filled 2015-05-23 (×12): qty 3

## 2015-05-23 NOTE — Evaluation (Signed)
Physical Therapy Evaluation Patient Details Name: Patricia [name] MRN: [id number] DOB: 14-Oct-1932 Today's Date: 05/23/2015   History of Present Illness  Pt adm with UTI and sepsis. PMH - kidney transplant, DVT  Clinical Impression  Pt admitted with above diagnosis and presents to PT with functional limitations due to deficits listed below (See PT problem list). Pt needs skilled PT to maximize independence and safety to allow discharge to ST-SNF unless pt progresses rapidly and husband can provide assist.      Follow Up Recommendations SNF (unless pt improves quickly and husband can provide 24 hour)    Equipment Recommendations  None recommended by PT    Recommendations for Other Services OT consult     Precautions / Restrictions Precautions Precautions: Fall      Mobility  Bed Mobility Overal bed mobility: Needs Assistance Bed Mobility: Supine to Sit;Sit to Supine     Supine to sit: Mod assist Sit to supine: Mod assist   General bed mobility comments: Assist to elevate trunk into sitting and assist to bring legs back up into bed into supine.  Transfers Overall transfer level: Needs assistance Equipment used: 4-wheeled walker Transfers: Sit to/from Stand Sit to Stand: Mod assist         General transfer comment: Assist to bring hips up  Ambulation/Gait Ambulation/Gait assistance: Min assist;Mod assist Ambulation Distance (Feet): 60 Feet Assistive device: 4-wheeled walker Gait Pattern/deviations: Step-to pattern;Decreased step length - right;Shuffle;Wide base of support   Gait velocity interpretation: Below normal speed for age/gender General Gait Details: Verbal/tactile cues to catch up with rt foot which kept lagging behind more and more. Also cues to stay closer to walker as she fatigued her feet became further behind. SaO2 92% amb on RA but down to 88% when returned to supine  Stairs            Wheelchair Mobility    Modified Rankin (Stroke Patients  Only)       Balance Overall balance assessment: Needs assistance Sitting-balance support: No upper extremity supported;Feet supported Sitting balance-Leahy Scale: Fair     Standing balance support: Bilateral upper extremity supported Standing balance-Leahy Scale: Poor Standing balance comment: support of rollator and min A                             Pertinent Vitals/Pain Pain Assessment: No/denies pain    Home Living Family/patient expects to be discharged to:: Private residence Living Arrangements: Spouse/significant other Available Help at Discharge: Family;Available PRN/intermittently Type of Home: House Home Access: Stairs to enter Entrance Stairs-Rails: Right;Left Entrance Stairs-Number of Steps: 5 Home Layout: Multi-level;Able to live on main level with bedroom/bathroom Home Equipment: Walker - 4 wheels;Bedside commode;Shower seat;Grab bars - tub/shower;Transport chair      Prior Function Level of Independence: Needs assistance   Gait / Transfers Assistance Needed: amb with rollator modified independent           Hand Dominance        Extremity/Trunk Assessment   Upper Extremity Assessment: Generalized weakness           Lower Extremity Assessment: Generalized weakness         Communication   Communication: No difficulties  Cognition Arousal/Alertness: Awake/alert Behavior During Therapy: WFL for tasks assessed/performed Overall Cognitive Status: Within Functional Limits for tasks assessed                      General Comments  Exercises        Assessment/Plan    PT Assessment Patient needs continued PT services  PT Diagnosis Difficulty walking;Generalized weakness   PT Problem List Decreased strength;Decreased activity tolerance;Decreased balance;Decreased mobility;Obesity  PT Treatment Interventions DME instruction;Gait training;Functional mobility training;Therapeutic activities;Therapeutic  exercise;Balance training;Patient/family education   PT Goals (Current goals can be found in the Care Plan section) Acute Rehab PT Goals Patient Stated Goal: return home PT Goal Formulation: With patient Time For Goal Achievement: 05/30/15 Potential to Achieve Goals: Good    Frequency Min 3X/week   Barriers to discharge        Co-evaluation               End of Session Equipment Utilized During Treatment: Gait belt Activity Tolerance: Patient limited by fatigue Patient left: in bed;with call bell/phone within reach;with bed alarm set Nurse Communication: Mobility status (SaO2 numbers)         Time: [id number] PT Time Calculation (min) (ACUTE ONLY): 41 min   Charges:   PT Evaluation $Initial PT Evaluation Tier I: 1 Procedure PT Treatments $Gait Training: 23-37 mins   PT G Codes:        Ajani Schnieders 2015/05/28, 7:10 PM  [name] PT [phone]

## 2015-05-23 NOTE — Progress Notes (Signed)
[facility] PROGRESS NOTE  ANGI GOODELL WCB:[id number] DOB: [birth year] DOA: 05/22/2015 PCP: [name], MD HPI/Subjective: JUELLE DICKMANN is a 81 y.o. WF PMHx Depression, Anxiety, COPD, HTN, CAD in native artery, Hypothyroidism Rt renal transplant, DVT last year, frequent urinary tract infections.   The patient told her husband that she was not feeling well this morning. They were on vacation and he decided to bring her home. Tonight she began to shake in the bed and he decided to call EMS. Upon arrival to the ER temperature was noted to be 103 she was found to have a positive UA. She was tachycardic and also quite confused initially. She is now alert and oriented and has no complaints. Her husband states that she had an episode of loose stool earlier today. No respiratory symptoms. She is going to be admitted for sepsis in relation to UTI. 8/8 A/O 4, negative CP, negative abdominal pain, negative N/V. States not on home O2.   Assessment/Plan: Sepsis secondary Escherichia coli UTI ; -Patient met sepsis guidelines upon admission with fever tachycardia confusion, lactic acidosis and leukocytosis - Urine Culture in 2014 grew out Escherichia coli which was ESBL resistant- she is chronically on Bactrim 3 times a week -Has received vancomycin, Azactam and Levaquin in the ER -Will continue  vancomycin and ertapenem -Blood cultures and urine culture pending - received 1.3 L normal saline in ER  -Continue normal saline 17ml/hr -Lactic acidosis resolved with hydration   Hx Renal transplant -Continue Imuran 50 mg BID -Cyclosporine 75 mg TID; check levels -Darifenacin 7.5 mg daily -Prednisone 5 mg daily  Acute on chronic renal failure (baseline Cr~1.14-1.26) -8/8 Cr= 1.47; improving with hydration  Hypertension -Stable -Continue amlodipine 5 mg daily  -Continue clonidine 0.1 mg  BID -Continue Toprol 100 mg daily   Hypothyroidism -Continue Synthroid  DVT (deep venous  thrombosis) -Right leg last year;Continue Coumadin per pharmacy  COPD -Continue Symbicort 160-4.5 BID -DuoNeb TID -Ambulatory SPO2  Urinary incontinence -Continue Enablex and oxybutynin  Pedal edema  Hypokalemia -K-Dur 40 mEq x 1  Hypomagnesemia -Magnesium oxide 400 mg x 1   Code Status: Full Family Communication: None Disposition Plan: Resolution sepsis   Consultants:   Procedures:   Cultures 8/7 blood right forearm/AC NGTD 8/7 urine positive Escherichia coli 8/7 MRSA by PCR negative    Antibiotics: Azactam 8/71 dose Levaquin 8/71 dose Vancomycin 8/7>> Ertapenem 8/7>>   DVT prophylaxis Warfarin    Objective: Filed Vitals:   05/23/15 1758 05/23/15 2114 05/23/15 2202 05/24/15 0625  BP:   134/55 127/64  Pulse:   67 66  Temp:   98.4 F (36.9 C) 98.1 F (36.7 C)  TempSrc:   Oral Oral  Resp:   18 18  Height:      Weight:      SpO2: 93% 98% 98% 96%    Intake/Output Summary (Last 24 hours) at 05/24/15 0724 Last data filed at 05/23/15 0900  Gross per 24 hour  Intake    120 ml  Output      0 ml  Net    120 ml   Filed Weights   05/22/15 0050 05/22/15 0510  Weight: 77.111 kg (170 lb) 77.1 kg (169 lb 15.6 oz)     Exam: General: A/O 4, NAD, No acute respiratory distress Eyes: Negative headache, eye pain, double vision,negative scleral hemorrhage ENT: Negative Runny nose, negative ear pain, negative tinnitus, negative gingival bleeding, Neck:  Negative scars, masses, torticollis, lymphadenopathy, JVD Lungs: diffuse expiratory wheezing, negative  crackles Cardiovascular: Regular rate and rhythm without murmur gallop or rub normal S1 and S2 Abdomen:negative abdominal pain, negative dysphagia, nondistended, positive soft, bowel sounds, no rebound, no ascites, no appreciable mass, well-healed surgical incision c/w renal transplant. Extremities: No significant cyanosis, clubbing. Positive edema bilateral lower extremities 1-2+ Psychiatric:   Negative depression, negative anxiety, negative fatigue, negative mania Neurologic:  Cranial nerves II through XII intact, tongue/uvula midline, all extremities muscle strength 5/5, sensation intact throughout, negative dysarthria, negative expressive aphasia, negative receptive aphasia.     Data Reviewed: Basic Metabolic Panel:  Recent Labs Lab 05/22/15 0100 05/22/15 0350 05/23/15 0545  NA 141 142 143  K 3.4* 3.1* 3.4*  CL 108 114* 114*  CO2 19* 18* 19*  GLUCOSE 144* 144* 109*  BUN 23* 19 13  CREATININE 1.88* 1.66* 1.47*  CALCIUM 9.4 8.3* 8.5*  MG  --   --  1.6*   Liver Function Tests:  Recent Labs Lab 05/22/15 0100 05/23/15 0545  AST 36 29  ALT 15 17  ALKPHOS 51 40  BILITOT 0.7 0.8  PROT 6.0* 5.4*  ALBUMIN 3.2* 2.6*   No results for input(s): LIPASE, AMYLASE in the last 168 hours. No results for input(s): AMMONIA in the last 168 hours. CBC:  Recent Labs Lab 05/22/15 0100 05/22/15 0350 05/23/15 0545  WBC 11.6* 10.8* 7.4  NEUTROABS 9.0*  --  5.7  HGB 12.4 11.4* 10.6*  HCT 37.3 35.5* 33.4*  MCV 92.6 93.2 94.9  PLT 192 171 162   Cardiac Enzymes: No results for input(s): CKTOTAL, CKMB, CKMBINDEX, TROPONINI in the last 168 hours. BNP (last 3 results) No results for input(s): BNP in the last 8760 hours.  ProBNP (last 3 results) No results for input(s): PROBNP in the last 8760 hours.  CBG: No results for input(s): GLUCAP in the last 168 hours.  Recent Results (from the past 240 hour(s))  Culture, blood (routine x 2)     Status: None (Preliminary result)   Collection Time: 05/22/15  1:00 AM  Result Value Ref Range Status   Specimen Description BLOOD RIGHT FOREARM  Final   Special Requests BOTTLES DRAWN AEROBIC AND ANAEROBIC 5CC  Final   Culture NO GROWTH 1 DAY  Final   Report Status PENDING  Incomplete  Urine culture     Status: None (Preliminary result)   Collection Time: 05/22/15  1:14 AM  Result Value Ref Range Status   Specimen Description URINE,  CATHETERIZED  Final   Special Requests NONE  Final   Culture >=100,000 COLONIES/mL ESCHERICHIA COLI  Final   Report Status PENDING  Incomplete  Culture, blood (routine x 2)     Status: None (Preliminary result)   Collection Time: 05/22/15  1:16 AM  Result Value Ref Range Status   Specimen Description BLOOD RIGHT ANTECUBITAL  Final   Special Requests BOTTLES DRAWN AEROBIC AND ANAEROBIC 10CC  Final   Culture NO GROWTH 1 DAY  Final   Report Status PENDING  Incomplete  MRSA PCR Screening     Status: None   Collection Time: 05/22/15  5:16 AM  Result Value Ref Range Status   MRSA by PCR NEGATIVE NEGATIVE Final    Comment:        The GeneXpert MRSA Assay (FDA approved for NASAL specimens only), is one component of a comprehensive MRSA colonization surveillance program. It is not intended to diagnose MRSA infection nor to guide or monitor treatment for MRSA infections.      Studies: No results found.  Scheduled Meds: . acidophilus  1 capsule Oral Daily  . amLODipine  5 mg Oral Daily  . antiseptic oral rinse  7 mL Mouth Rinse BID  . azaTHIOprine  50 mg Oral BID  . budesonide-formoterol  2 puff Inhalation Daily  . cloNIDine  0.1 mg Oral BID  . cycloSPORINE modified  75 mg Oral TID  . darifenacin  7.5 mg Oral q morning - 10a  . ertapenem  1 g Intravenous Q24H  . ipratropium-albuterol  3 mL Nebulization 3 times per day  . levothyroxine  75 mcg Oral QAC breakfast  . metoprolol succinate  100 mg Oral Daily  . mirabegron ER  25 mg Oral QPM  . oxybutynin  10 mg Oral QHS  . pantoprazole  40 mg Oral Daily  . predniSONE  5 mg Oral Q breakfast  . vancomycin  750 mg Intravenous Q24H  . Warfarin - Pharmacist Dosing Inpatient   Does not apply q1800   Continuous Infusions: . sodium chloride 100 mL/hr at 05/24/15 0202    Principal Problem:   Sepsis secondary to UTI Active Problems:   History of renal transplant   Hypertension   Hypothyroidism   Urinary tract infection   Sepsis    DVT (deep venous thrombosis)   AKI (acute kidney injury)   CKD (chronic kidney disease) stage 3, GFR 30-59 ml/min   Escherichia coli urinary tract infection   Other emphysema   Hypokalemia   Hypomagnesemia    Time spent: 40 minutes    Jessy Cybulski, [place] [facility] Pager [phone]. If 7PM-7AM, please contact night-coverage at [email or web address], password [facility] 05/24/2015, 7:24 AM  LOS: 2 days    Care during the described time interval was provided by me .  I have reviewed this patient's available data, including medical history, events of note, physical examination, and all test results as part of my evaluation. I have personally reviewed and interpreted all radiology studies.   Dia Crawford, MD [phone] Pager

## 2015-05-23 NOTE — Progress Notes (Signed)
ANTICOAGULATION CONSULT NOTE - Follow Up Consult  Pharmacy Consult for Coumadin Indication: Hx of DVT  Allergies  Allergen Reactions  . Aricept [Donepezil Hcl] Nausea And Vomiting  . Codeine Nausea And Vomiting  . Penicillins Other (See Comments)    Doesn't remember  . Statins Other (See Comments)    Doesn't remember  . Streptomycin Other (See Comments)    unknown  . Tetracyclines & Related Rash    Patient Measurements: Height: 5\' 5"  (165.1 cm) Weight: 169 lb 15.6 oz (77.1 kg) IBW/kg (Calculated) : 57  Vital Signs: Temp: 98.9 F (37.2 C) (08/08 0523) Temp Source: Oral (08/08 0523) BP: 139/54 mmHg (08/08 0523) Pulse Rate: 87 (08/08 0523)  Labs:  Recent Labs  05/22/15 0100 05/22/15 0350 05/23/15 0545  HGB 12.4 11.4* 10.6*  HCT 37.3 35.5* 33.4*  PLT 192 171 162  APTT 28  --   --   LABPROT 17.7*  --  19.6*  INR 1.45  --  1.66*  CREATININE 1.88* 1.66* 1.47*    Estimated Creatinine Clearance: 30.8 mL/min (by C-G formula based on Cr of 1.47).  Assessment: 81 y/o F on warfarin PTA alternating 5mg  and 2.5mg  for hx of DVT, INR on admit is sub-therapeutic at 1.45. INR stilll low today but up to 1.66. CBC ok, no s/s of bleed.  Goal of Therapy:  INR 2-3 Monitor platelets by anticoagulation protocol: Yes   Plan:  Give coumadin 5 mg PO x 1 tonight at 1800 Monitor daily INR, CBC, s/s of bleed  Johnta Couts J 05/23/2015,8:50 AM

## 2015-05-23 NOTE — Progress Notes (Signed)
  Echocardiogram 2D Echocardiogram has been performed.  Patricia [name] 05/23/2015, 3:53 PM

## 2015-05-23 NOTE — Care Management Important Message (Signed)
Important Message  Patient Details  Name: Patricia [name] MRN: [id number] Date of Birth: 1932/06/14   Medicare Important Message Given:  Yes-second notification given    Zenon Mayo, RN 05/23/2015, 9:52 AMImportant Message  Patient Details  Name: [name] MRN: [id number] Date of Birth: 05-02-1932   Medicare Important Message Given:  Yes-second notification given    Zenon Mayo, RN 05/23/2015, 9:51 AM

## 2015-05-23 NOTE — Discharge Instructions (Signed)

## 2015-05-23 NOTE — Care Management Note (Addendum)
Case Management Note  Patient Details  Name: Patricia [name] MRN: [id number] Date of Birth: Sep 09, 1932  Subjective/Objective:        NCM spoke with patient, she lives with her spouse, she uses a rollator at home,  Her spouse takes her to her MD apts, she has Midwife.  She gets her medications by express mail.  NCM will continue to follow for dc needs.  Await pt eval.           Action/Plan:   Expected Discharge Date:                  Expected Discharge Plan:  [place]  In-House Referral:     Discharge planning Services  CM Consult  Post Acute Care Choice:    Choice offered to:     DME Arranged:    DME Agency:     HH Arranged:    HH Agency:     Status of Service:  In process, will continue to follow  Medicare Important Message Given:  Yes-second notification given Date Medicare IM Given:    Medicare IM give by:    Date Additional Medicare IM Given:    Additional Medicare Important Message give by:     If discussed at [place] of Stay Meetings, dates discussed:    Additional Comments:  Zenon Mayo, RN 05/23/2015, 9:52 AM

## 2015-05-24 DIAGNOSIS — E876 Hypokalemia: Secondary | ICD-10-CM | POA: Diagnosis present

## 2015-05-24 DIAGNOSIS — N390 Urinary tract infection, site not specified: Secondary | ICD-10-CM | POA: Diagnosis present

## 2015-05-24 DIAGNOSIS — B9620 Unspecified Escherichia coli [E. coli] as the cause of diseases classified elsewhere: Secondary | ICD-10-CM | POA: Diagnosis present

## 2015-05-24 DIAGNOSIS — J438 Other emphysema: Secondary | ICD-10-CM | POA: Diagnosis present

## 2015-05-24 LAB — URINE CULTURE: Culture: >=100000

## 2015-05-24 LAB — PROTIME-INR
INR: 1.87 — ABNORMAL HIGH (ref 0.00–1.49)
PROTHROMBIN TIME: 21.5 s — AB (ref 11.6–15.2)

## 2015-05-24 MED ORDER — ZOLPIDEM TARTRATE 5 MG PO TABS
5.0000 mg | ORAL_TABLET | Freq: Once | ORAL | Status: AC
  Administered 2015-05-24: 5 mg via ORAL
  Filled 2015-05-24: qty 1

## 2015-05-24 MED ORDER — WARFARIN SODIUM 5 MG PO TABS
5.0000 mg | ORAL_TABLET | Freq: Once | ORAL | Status: AC
  Administered 2015-05-24: 5 mg via ORAL
  Filled 2015-05-24: qty 1

## 2015-05-24 NOTE — Progress Notes (Signed)
Physical Therapy Treatment Patient Details Name: Patricia [name] MRN: [id number] DOB: Dec 22, 1931 Today's Date: May 30, 2015    History of Present Illness Pt adm with UTI and sepsis. PMH - kidney transplant, DVT    PT Comments    Pt continues to have poor mobility and doubt spouse can manage pt at home. Pt doesn't want to make a decision about SNF and want to wait for husband to come.  Follow Up Recommendations  SNF     Equipment Recommendations  None recommended by PT    Recommendations for Other Services OT consult     Precautions / Restrictions Precautions Precautions: Fall    Mobility  Bed Mobility Overal bed mobility: Needs Assistance Bed Mobility: Supine to Sit     Supine to sit: Mod assist     General bed mobility comments: Assist to elevate trunk into sitting.  Transfers Overall transfer level: Needs assistance Equipment used: 4-wheeled walker Transfers: Sit to/from Stand Sit to Stand: Mod assist;+2 safety/equipment         General transfer comment: Assist to bring hips up  Ambulation/Gait   Ambulation Distance (Feet): 12 Feet Assistive device: 4-wheeled walker Gait Pattern/deviations: Step-to pattern;Decreased step length - right;Decreased step length - left;Trunk flexed;Wide base of support;Shuffle   Gait velocity interpretation: Below normal speed for age/gender General Gait Details: Verbal/tactile cues to take incr step length and get closer to walker. Pt with rt leg still lagging behind.   Stairs            Wheelchair Mobility    Modified Rankin (Stroke Patients Only)       Balance Overall balance assessment: Needs assistance Sitting-balance support: No upper extremity supported;Feet supported Sitting balance-Leahy Scale: Fair     Standing balance support: Bilateral upper extremity supported Standing balance-Leahy Scale: Poor Standing balance comment: rollator and min A for static standing.                     Cognition Arousal/Alertness: Awake/alert Behavior During Therapy: WFL for tasks assessed/performed Overall Cognitive Status: Within Functional Limits for tasks assessed                      Exercises      General Comments        Pertinent Vitals/Pain Pain Assessment: No/denies pain    Home Living                      Prior Function            PT Goals (current goals can now be found in the care plan section) Acute Rehab PT Goals Patient Stated Goal: return home PT Goal Formulation: With patient Time For Goal Achievement: 05/30/15 Potential to Achieve Goals: Good Progress towards PT goals: Not progressing toward goals - comment    Frequency  Min 3X/week    PT Plan Current plan remains appropriate    Co-evaluation             End of Session Equipment Utilized During Treatment: Gait belt Activity Tolerance: Patient limited by fatigue Patient left: with call bell/phone within reach;in chair;with nursing/sitter in room     Time: 1010-1032 PT Time Calculation (min) (ACUTE ONLY): 22 min  Charges:  $Gait Training: 8-22 mins                    G Codes:      Patricia [name] 05-30-2015, 12:10 PM  [name] PT [phone]

## 2015-05-24 NOTE — Progress Notes (Signed)
[facility] PROGRESS NOTE  Patricia [name] [id number] DOB: [birth year] DOA: 05/22/2015 PCP: [name], MD  Patricia [name] is a 81 y.o. WF PMHx Depression, Anxiety, COPD, HTN, CAD in native artery, Hypothyroidism Rt renal transplant, DVT last year, frequent urinary tract infections.  The patient told her husband that she was not feeling well. They were on vacation and he decided to bring her home. Tonight she began to shake in the bed and he decided to call EMS. Upon arrival to the ER temperature was noted to be 103 she was found to have a positive UA. She was tachycardic and also quite confused initially. She is now alert and oriented and has no complaints. Her husband states that she had an episode of loose stool. No respiratory symptoms. She was admitted for sepsis in relation to UTI.  HPI/Subjective: Eating better   Assessment/Plan: Sepsis secondary Escherichia coli UTI ; -Patient met sepsis guidelines upon admission with fever tachycardia confusion, lactic acidosis and leukocytosis - Urine Culture in 2014 grew out Escherichia coli which was ESBL resistant- she is chronically on Bactrim 3 times a week -Will continue ertapenem only -Blood cultures and urine culture pending - received 1.3 L normal saline in ER  -Lactic acidosis resolved with hydration   Hx Renal transplant -Continue Imuran 50 mg BID -Cyclosporine 75 mg TID; check levels -Darifenacin 7.5 mg daily -Prednisone 5 mg daily  Acute on chronic renal failure (baseline Cr~1.14-1.26) improved with hydration  Hypertension -Stable -Continue amlodipine 5 mg daily  -Continue clonidine 0.1 mg  BID -Continue Toprol 100 mg daily   Hypothyroidism -Continue Synthroid  DVT (deep venous thrombosis) -Right leg last year;Continue Coumadin per pharmacy  COPD -Continue Symbicort 160-4.5 BID -DuoNeb TID -Ambulatory SPO2  Urinary incontinence -Continue Enablex and oxybutynin   Hypokalemia -K-Dur 40 mEq  x 1  Hypomagnesemia -Magnesium oxide 400 mg x 1   Code Status: Full Family Communication: None Disposition Plan: SNF +/- abx-- may need PICC   Consultants:   Procedures:   Cultures 8/7 blood right forearm/AC NGTD 8/7 urine positive Escherichia coli 8/7 MRSA by PCR negative    Antibiotics: Azactam 8/71 dose Levaquin 8/71 dose Vancomycin 8/7>> Ertapenem 8/7>>   DVT prophylaxis Warfarin    Objective: Filed Vitals:   05/23/15 2114 05/23/15 2202 05/24/15 0625 05/24/15 0920  BP:  134/55 127/64   Pulse:  67 66   Temp:  98.4 F (36.9 C) 98.1 F (36.7 C)   TempSrc:  Oral Oral   Resp:  18 18   Height:      Weight:      SpO2: 98% 98% 96% 97%    Intake/Output Summary (Last 24 hours) at 05/24/15 1227 Last data filed at 05/24/15 0700  Gross per 24 hour  Intake   1400 ml  Output      0 ml  Net   1400 ml   Filed Weights   05/22/15 0050 05/22/15 0510  Weight: 77.111 kg (170 lb) 77.1 kg (169 lb 15.6 oz)     Exam: General: A/O 4, NAD, No acute respiratory distress Lungs: diminshed Cardiovascular: Regular rate and rhythm without murmur gallop or rub normal S1 and S2 Abdomen:negative abdominal pain, negative dysphagia, nondistended, positive soft, bowel sounds, no rebound, no ascites, no appreciable mass, well-healed surgical incision c/w renal transplant. Extremities: No significant cyanosis, clubbing. Positive edema bilateral lower extremities 1-2+     Data Reviewed: Basic Metabolic Panel:  Recent Labs Lab 05/22/15 0100 05/22/15 0350 05/23/15 0545  NA  141 142 143  K 3.4* 3.1* 3.4*  CL 108 114* 114*  CO2 19* 18* 19*  GLUCOSE 144* 144* 109*  BUN 23* 19 13  CREATININE 1.88* 1.66* 1.47*  CALCIUM 9.4 8.3* 8.5*  MG  --   --  1.6*   Liver Function Tests:  Recent Labs Lab 05/22/15 0100 05/23/15 0545  AST 36 29  ALT 15 17  ALKPHOS 51 40  BILITOT 0.7 0.8  PROT 6.0* 5.4*  ALBUMIN 3.2* 2.6*   No results for input(s): LIPASE, AMYLASE in the  last 168 hours. No results for input(s): AMMONIA in the last 168 hours. CBC:  Recent Labs Lab 05/22/15 0100 05/22/15 0350 05/23/15 0545  WBC 11.6* 10.8* 7.4  NEUTROABS 9.0*  --  5.7  HGB 12.4 11.4* 10.6*  HCT 37.3 35.5* 33.4*  MCV 92.6 93.2 94.9  PLT 192 171 162   Cardiac Enzymes: No results for input(s): CKTOTAL, CKMB, CKMBINDEX, TROPONINI in the last 168 hours. BNP (last 3 results) No results for input(s): BNP in the last 8760 hours.  ProBNP (last 3 results) No results for input(s): PROBNP in the last 8760 hours.  CBG: No results for input(s): GLUCAP in the last 168 hours.  Recent Results (from the past 240 hour(s))  Culture, blood (routine x 2)     Status: None (Preliminary result)   Collection Time: 05/22/15  1:00 AM  Result Value Ref Range Status   Specimen Description BLOOD RIGHT FOREARM  Final   Special Requests BOTTLES DRAWN AEROBIC AND ANAEROBIC 5CC  Final   Culture NO GROWTH 1 DAY  Final   Report Status PENDING  Incomplete  Urine culture     Status: None   Collection Time: 05/22/15  1:14 AM  Result Value Ref Range Status   Specimen Description URINE, CATHETERIZED  Final   Special Requests NONE  Final   Culture   Final    >=100,000 COLONIES/mL ESCHERICHIA COLI Confirmed Extended Spectrum Beta-Lactamase Producer (ESBL)    Report Status 05/24/2015 FINAL  Final   Organism ID, Bacteria ESCHERICHIA COLI  Final      Susceptibility   Escherichia coli - MIC*    AMPICILLIN >=32 RESISTANT Resistant     CEFAZOLIN >=64 RESISTANT Resistant     CEFTRIAXONE >=64 RESISTANT Resistant     CIPROFLOXACIN >=4 RESISTANT Resistant     GENTAMICIN <=1 SENSITIVE Sensitive     IMIPENEM <=0.25 SENSITIVE Sensitive     NITROFURANTOIN <=16 SENSITIVE Sensitive     TRIMETH/SULFA >=320 RESISTANT Resistant     AMPICILLIN/SULBACTAM >=32 RESISTANT Resistant     PIP/TAZO <=4 SENSITIVE Sensitive     * >=100,000 COLONIES/mL ESCHERICHIA COLI  Culture, blood (routine x 2)     Status: None  (Preliminary result)   Collection Time: 05/22/15  1:16 AM  Result Value Ref Range Status   Specimen Description BLOOD RIGHT ANTECUBITAL  Final   Special Requests BOTTLES DRAWN AEROBIC AND ANAEROBIC 10CC  Final   Culture NO GROWTH 1 DAY  Final   Report Status PENDING  Incomplete  MRSA PCR Screening     Status: None   Collection Time: 05/22/15  5:16 AM  Result Value Ref Range Status   MRSA by PCR NEGATIVE NEGATIVE Final    Comment:        The GeneXpert MRSA Assay (FDA approved for NASAL specimens only), is one component of a comprehensive MRSA colonization surveillance program. It is not intended to diagnose MRSA infection nor to guide or monitor treatment  for MRSA infections.      Studies: No results found.  Scheduled Meds: . acidophilus  1 capsule Oral Daily  . amLODipine  5 mg Oral Daily  . antiseptic oral rinse  7 mL Mouth Rinse BID  . azaTHIOprine  50 mg Oral BID  . budesonide-formoterol  2 puff Inhalation Daily  . cloNIDine  0.1 mg Oral BID  . cycloSPORINE modified  75 mg Oral TID  . darifenacin  7.5 mg Oral q morning - 10a  . ertapenem  1 g Intravenous Q24H  . ipratropium-albuterol  3 mL Nebulization 3 times per day  . levothyroxine  75 mcg Oral QAC breakfast  . metoprolol succinate  100 mg Oral Daily  . mirabegron ER  25 mg Oral QPM  . oxybutynin  10 mg Oral QHS  . pantoprazole  40 mg Oral Daily  . predniSONE  5 mg Oral Q breakfast  . vancomycin  750 mg Intravenous Q24H  . warfarin  5 mg Oral ONCE-1800  . Warfarin - Pharmacist Dosing Inpatient   Does not apply q1800   Continuous Infusions: . sodium chloride 100 mL/hr at 05/24/15 0202    Principal Problem:   Sepsis secondary to UTI Active Problems:   History of renal transplant   Hypertension   Hypothyroidism   Urinary tract infection   Sepsis   DVT (deep venous thrombosis)   AKI (acute kidney injury)   CKD (chronic kidney disease) stage 3, GFR 30-59 ml/min   Escherichia coli urinary tract  infection   Other emphysema   Hypokalemia   Hypomagnesemia    Time spent: 25 minutes    Aundrey Elahi, [place] [facility] Pager [phone]. If 7PM-7AM, please contact night-coverage at [email or web address], password [facility] 05/24/2015, 12:27 PM  LOS: 2 days

## 2015-05-24 NOTE — Clinical Social Work Note (Signed)
Clinical Social Work Assessment  Patient Details  Name: Patricia [name] MRN: [id number] Date of Birth: Mar 09, 1932  Date of referral:  05/24/15               Reason for consult:  Facility Placement                Permission sought to share information with:  Family Supports, Chartered certified accountant granted to share information::  Yes, Verbal Permission Granted  Name::     [employer], [place] SNF's, Husband- Designer, multimedia::     Relationship::     Contact Information:     Housing/Transportation Living arrangements for the past 2 months:  [place] of Information:  Patient, Spouse Patient Interpreter Needed:  None Criminal Activity/Legal Involvement Pertinent to Current Situation/Hospitalization:  No - Comment as needed Significant Relationships:  Spouse Lives with:  Spouse Do you feel safe going back to the place where you live?  Yes Need for family participation in patient care:  Yes (Comment)  Care giving concerns:  Patient lives at home with her husband and has had a past rehab stay at [facility]. She has managed fairly well but PT now recommends SNF rehab. Patient admits that she is weaker this hospitalization.   Social Worker assessment / plan:  CSW met with patient and husband today to discuss recommendation for SNF.  Both are agreeable to this plan and familiar with placement process. Patient states that she would love to return to [facility] if possible as she received good care there. CSW contacted the Admissions Director at [facility] and she will review the referral.  Fl2 completed and will be placed on chart for MD's signature.  Anticipate stability for d/c tomorrow if she continues to improve. Patient and husband denied any questions or concerns about SNF placement.    Employment status:  Retired Nurse, adult PT Recommendations:  [place] / Referral to community resources:   [place]  Patient/Family's Response to care:  Patient was hoping to be able to go home but acknowledges that she is actually weaker today than yesterday.  She states that if rehab will help her to be safe at home- then she wants to return home. Husband is agreeable to "what is best for my wife" and supports SNF plan.  Patient/Family's Understanding of and Emotional Response to Diagnosis, Current Treatment, and Prognosis:  Patient appears to have a limited understanding of her current diagnosis and current treatment needs.  She states that she was well cared for during her stay at [facility] and does not worry about seeking SNF again. She was noted to be relaxed and calm during the visit and very accepting of SNF stay.  Husband was noted to be smiling and relaxed during the visit- feels that he wants her to be as strong as possible.    Emotional Assessment Appearance:  Appears stated age Attitude/Demeanor/Rapport:   (Pleasant, calm, relaxed) Affect (typically observed):  Happy, Calm, Quiet, Pleasant Orientation:  Oriented to Self, Oriented to Place, Oriented to  Time, Oriented to Situation Alcohol / Substance use:  Never Used Psych involvement (Current and /or in the community):  No (Comment)  Discharge Needs  Concerns to be addressed:  Care Coordination Readmission within the last 30 days:  No Current discharge risk:  None Barriers to Discharge:  No Barriers Identified   Williemae Area, LCSW 05/24/2015, 3:15 PM

## 2015-05-24 NOTE — Clinical Social Work Placement (Signed)
   CLINICAL SOCIAL WORK PLACEMENT  NOTE  Date:  05/24/2015  Patient Details  Name: Patricia [name] MRN: [id number] Date of Birth: 12-11-1931  Clinical Social Work is seeking post-discharge placement for this patient at the [place] level of care (*CSW will initial, date and re-position this form in  chart as items are completed):  Yes   Patient/family provided with [place] Work Department's list of facilities offering this level of care within the geographic area requested by the patient (or if unable, by the patient's family).  Yes   Patient/family informed of their freedom to choose among providers that offer the needed level of care, that participate in Medicare, Medicaid or managed care program needed by the patient, have an available bed and are willing to accept the patient.  Yes   Patient/family informed of [facility]'s ownership interest in [facility] and [facility], as well as of the fact that they are under no obligation to receive care at these facilities.  PASRR submitted to EDS on       PASRR number received on       Existing PASRR number confirmed on 05/24/15     FL2 transmitted to all facilities in geographic area requested by pt/family on 05/24/15     FL2 transmitted to all facilities within larger geographic area on       Patient informed that his/her managed care company has contracts with or will negotiate with certain facilities, including the following:   [facility]- Medicare Complete)         Patient/family informed of bed offers received.  Patient chooses bed at       Physician recommends and patient chooses bed at      Patient to be transferred to   on  .  Patient to be transferred to facility by Ambulance [facility])     Patient family notified on   of transfer.  Name of family member notified:        PHYSICIAN Please prepare priority discharge summary, including medications, Please sign FL2, Please prepare  prescriptions     Additional Comment:    _______________________________________________ Williemae Area, LCSW 05/24/2015, 3:12 PM

## 2015-05-24 NOTE — Progress Notes (Signed)
ANTICOAGULATION CONSULT NOTE - Follow Up Consult  Pharmacy Consult for Warfarin Indication: Hx of DVT  Allergies  Allergen Reactions  . Aricept [Donepezil Hcl] Nausea And Vomiting  . Codeine Nausea And Vomiting  . Penicillins Other (See Comments)    Doesn't remember  . Statins Other (See Comments)    Doesn't remember  . Streptomycin Other (See Comments)    unknown  . Tetracyclines & Related Rash    Patient Measurements: Height: 5\' 5"  (165.1 cm) Weight: 169 lb 15.6 oz (77.1 kg) IBW/kg (Calculated) : 57   Vital Signs: Temp: 98.1 F (36.7 C) (08/09 0625) Temp Source: Oral (08/09 0625) BP: 127/64 mmHg (08/09 0625) Pulse Rate: 66 (08/09 0625)  Labs:  Recent Labs  05/22/15 0100 05/22/15 0350 05/23/15 0545 05/24/15 0649  HGB 12.4 11.4* 10.6*  --   HCT 37.3 35.5* 33.4*  --   PLT 192 171 162  --   APTT 28  --   --   --   LABPROT 17.7*  --  19.6* 21.5*  INR 1.45  --  1.66* 1.87*  CREATININE 1.88* 1.66* 1.47*  --     Estimated Creatinine Clearance: 30.8 mL/min (by C-G formula based on Cr of 1.47).   Medications:  Scheduled:  . acidophilus  1 capsule Oral Daily  . amLODipine  5 mg Oral Daily  . antiseptic oral rinse  7 mL Mouth Rinse BID  . azaTHIOprine  50 mg Oral BID  . budesonide-formoterol  2 puff Inhalation Daily  . cloNIDine  0.1 mg Oral BID  . cycloSPORINE modified  75 mg Oral TID  . darifenacin  7.5 mg Oral q morning - 10a  . ertapenem  1 g Intravenous Q24H  . ipratropium-albuterol  3 mL Nebulization 3 times per day  . levothyroxine  75 mcg Oral QAC breakfast  . metoprolol succinate  100 mg Oral Daily  . mirabegron ER  25 mg Oral QPM  . oxybutynin  10 mg Oral QHS  . pantoprazole  40 mg Oral Daily  . predniSONE  5 mg Oral Q breakfast  . vancomycin  750 mg Intravenous Q24H  . Warfarin - Pharmacist Dosing Inpatient   Does not apply q1800    Assessment: 81 y/o F on warfarinfor hx of DVT, INR on admit is sub-therapeutic at 1.45. INR improving but still  low at 1.87 on Am labs. No s/sx bleeding noted  Results for ROSENDA, HORWEDEL (MRN [id number]) as of 05/24/2015 08:24  Ref. Range 05/22/2015 01:00 05/22/2015 03:50 05/23/2015 05:45  Hemoglobin Latest Ref Range: 12.0-15.0 g/dL 12.4 11.4 (L) 10.6 (L)  HCT Latest Ref Range: 36.0-46.0 % 37.3 35.5 (L) 33.4 (L)      Goal of Therapy:  INR 2-3 Monitor platelets by anticoagulation protocol: Yes   Plan:  - Coumadin 5mg  PO x 1 dose tonight - [place] INR, CBC, s/sx bleeding  Reatha Harps, Pharm.D., BCPS Clinical Pharmacist [phone] Pager 05/24/2015 8:38 AM

## 2015-05-25 ENCOUNTER — Inpatient Hospital Stay (HOSPITAL_COMMUNITY): Payer: Medicare Other

## 2015-05-25 DIAGNOSIS — N183 Chronic kidney disease, stage 3 (moderate): Secondary | ICD-10-CM

## 2015-05-25 DIAGNOSIS — N179 Acute kidney failure, unspecified: Secondary | ICD-10-CM

## 2015-05-25 LAB — CBC
HEMATOCRIT: 34.5 % — AB (ref 36.0–46.0)
Hemoglobin: 10.9 g/dL — ABNORMAL LOW (ref 12.0–15.0)
MCH: 30.5 pg (ref 26.0–34.0)
MCHC: 31.6 g/dL (ref 30.0–36.0)
MCV: 96.6 fL (ref 78.0–100.0)
PLATELETS: 151 10*3/uL (ref 150–400)
RBC: 3.57 MIL/uL — AB (ref 3.87–5.11)
RDW: 15.4 % (ref 11.5–15.5)
WBC: 6.3 10*3/uL (ref 4.0–10.5)

## 2015-05-25 LAB — BASIC METABOLIC PANEL
ANION GAP: 8 (ref 5–15)
BUN: 10 mg/dL (ref 6–20)
CHLORIDE: 114 mmol/L — AB (ref 101–111)
CO2: 21 mmol/L — ABNORMAL LOW (ref 22–32)
CREATININE: 1.26 mg/dL — AB (ref 0.44–1.00)
Calcium: 9.1 mg/dL (ref 8.9–10.3)
GFR calc Af Amer: 45 mL/min — ABNORMAL LOW (ref >60)
GFR calc non Af Amer: 39 mL/min — ABNORMAL LOW (ref >60)
GLUCOSE: 98 mg/dL (ref 65–99)
Potassium: 4.2 mmol/L (ref 3.5–5.1)
Sodium: 143 mmol/L (ref 135–145)

## 2015-05-25 LAB — CYCLOSPORINE: Cyclosporine, LabCorp: 420 ng/mL — ABNORMAL HIGH (ref 100–400)

## 2015-05-25 LAB — PROTIME-INR
INR: 1.98 — ABNORMAL HIGH (ref 0.00–1.49)
Prothrombin Time: 22.4 seconds — ABNORMAL HIGH (ref 11.6–15.2)

## 2015-05-25 MED ORDER — FUROSEMIDE 40 MG PO TABS
40.0000 mg | ORAL_TABLET | Freq: Every day | ORAL | Status: DC
  Administered 2015-05-25 – 2015-05-26 (×2): 40 mg via ORAL
  Filled 2015-05-25 (×2): qty 1

## 2015-05-25 MED ORDER — FUROSEMIDE 40 MG PO TABS: 40.0000 mg | ORAL_TABLET | Freq: Two times a day (BID) | ORAL | Status: DC

## 2015-05-25 MED ORDER — WARFARIN SODIUM 5 MG PO TABS
5.0000 mg | ORAL_TABLET | Freq: Once | ORAL | Status: AC
  Administered 2015-05-25: 5 mg via ORAL
  Filled 2015-05-25: qty 1

## 2015-05-25 MED ORDER — FUROSEMIDE 80 MG PO TABS
80.0000 mg | ORAL_TABLET | Freq: Every day | ORAL | Status: DC
  Administered 2015-05-26: 80 mg via ORAL
  Filled 2015-05-25: qty 1

## 2015-05-25 NOTE — Progress Notes (Signed)
ANTICOAGULATION CONSULT NOTE - Follow Up Consult  Pharmacy Consult for Warfarin Indication: Hx of DVT  Allergies  Allergen Reactions  . Aricept [Donepezil Hcl] Nausea And Vomiting  . Codeine Nausea And Vomiting  . Penicillins Other (See Comments)    Doesn't remember  . Statins Other (See Comments)    Doesn't remember  . Streptomycin Other (See Comments)    unknown  . Tetracyclines & Related Rash    Patient Measurements: Height: 5\' 5"  (165.1 cm) Weight: 169 lb 15.6 oz (77.1 kg) IBW/kg (Calculated) : 57   Vital Signs: Temp: 98.2 F (36.8 C) (08/10 0703) Temp Source: Oral (08/10 0703) BP: 160/87 mmHg (08/10 0709) Pulse Rate: 71 (08/10 0709)  Labs:  Recent Labs  05/23/15 0545 05/24/15 0649 05/25/15 0701  HGB 10.6*  --  10.9*  HCT 33.4*  --  34.5*  PLT 162  --  151  LABPROT 19.6* 21.5* 22.4*  INR 1.66* 1.87* 1.98*  CREATININE 1.47*  --  1.26*    Estimated Creatinine Clearance: 35.9 mL/min (by C-G formula based on Cr of 1.26).   Medications:  Scheduled:  . acidophilus  1 capsule Oral Daily  . amLODipine  5 mg Oral Daily  . antiseptic oral rinse  7 mL Mouth Rinse BID  . azaTHIOprine  50 mg Oral BID  . budesonide-formoterol  2 puff Inhalation Daily  . cloNIDine  0.1 mg Oral BID  . cycloSPORINE modified  75 mg Oral TID  . darifenacin  7.5 mg Oral q morning - 10a  . ertapenem  1 g Intravenous Q24H  . furosemide  40 mg Oral Q supper  . furosemide  40-80 mg Oral BID  . [START ON 05/26/2015] furosemide  80 mg Oral Q breakfast  . ipratropium-albuterol  3 mL Nebulization 3 times per day  . levothyroxine  75 mcg Oral QAC breakfast  . metoprolol succinate  100 mg Oral Daily  . mirabegron ER  25 mg Oral QPM  . oxybutynin  10 mg Oral QHS  . pantoprazole  40 mg Oral Daily  . predniSONE  5 mg Oral Q breakfast  . Warfarin - Pharmacist Dosing Inpatient   Does not apply q1800    Assessment: 81 y/o F on warfarin for hx of DVT, INR on admit is sub-therapeutic at 1.45.  INR improving but still low at 1.98 on Am labs. No s/sx bleeding noted  Goal of Therapy:  INR 2-3 Monitor platelets by anticoagulation protocol: Yes   Plan:  - Coumadin 5mg  PO x 1 dose tonight - [place] INR, CBC, s/sx bleeding  Consider decreasing treatment for overactive bladder to one agent -- currently on Myrbetriq, Enablex, oxybutinin  Thank you. Anette Guarneri, PharmD [phone]   05/25/2015 11:38 AM

## 2015-05-25 NOTE — Progress Notes (Signed)
[facility] PROGRESS NOTE  Patricia [name] MOQ:[id number] DOB: 04-30-1932 DOA: 05/22/2015 PCP: [name], MD  Patricia [name] is a 81 y.o. WF PMHx Depression, Anxiety, COPD, HTN, CAD in native artery, Hypothyroidism Rt renal transplant, DVT last year, frequent urinary tract infections.  The patient told her husband that she was not feeling well. They were on vacation and he decided to bring her home. Tonight she began to shake in the bed and he decided to call EMS. Upon arrival to the ER temperature was noted to be 103 she was found to have a positive UA. She was tachycardic and also quite confused initially. She is now alert and oriented and has no complaints. Her husband states that she had an episode of loose stool. No respiratory symptoms. She was admitted for sepsis in relation to UTI.  HPI/Subjective: SOB- on 4L  +2.3 L since admission   Assessment/Plan: Sepsis secondary Escherichia coli UTI ; -Patient met sepsis guidelines upon admission with fever tachycardia confusion, lactic acidosis and leukocytosis - Urine Culture in 2014 grew out Escherichia coli which was ESBL resistant- she is chronically on Bactrim 3 times a week -Will continue ertapenem only -Blood cultures and urine culture pending - received 1.3 L normal saline in ER  -Lactic acidosis resolved with hydration   Acute resp failure -dg chest -resume lasix -does not wear O2 at home   Hx Renal transplant -Continue Imuran 50 mg BID -Cyclosporine 75 mg TID; check levels -Darifenacin 7.5 mg daily -Prednisone 5 mg daily  Acute on chronic renal failure (baseline Cr~1.14-1.26) improved with hydration  Hypertension -Stable -Continue amlodipine 5 mg daily  -Continue clonidine 0.1 mg  BID -Continue Toprol 100 mg daily   Hypothyroidism -Continue Synthroid  DVT (deep venous thrombosis) -Right leg last year;Continue Coumadin per pharmacy  COPD -Continue Symbicort 160-4.5 BID -DuoNeb  TID -Ambulatory SPO2  Urinary incontinence -Continue Enablex and oxybutynin   Hypokalemia -K-Dur 40 mEq x 1  Hypomagnesemia -Magnesium oxide 400 mg x 1   Code Status: Full Family Communication: None Disposition Plan: SNF once off O2 and abx finished   Consultants:   Procedures:   Cultures 8/7 blood right forearm/AC NGTD 8/7 urine positive Escherichia coli 8/7 MRSA by PCR negative    Antibiotics: Azactam 8/71 dose Levaquin 8/71 dose Vancomycin 8/7>>8/9 Ertapenem 8/7>>   DVT prophylaxis Warfarin    Objective: Filed Vitals:   05/24/15 2235 05/25/15 0703 05/25/15 0709 05/25/15 0836  BP: 153/62 175/54 160/87   Pulse: 70 70 71   Temp: 98.8 F (37.1 C) 98.2 F (36.8 C)    TempSrc: Oral Oral    Resp: 18 22    Height:      Weight:      SpO2: 97% 92% 94% 90%    Intake/Output Summary (Last 24 hours) at 05/25/15 1031 Last data filed at 05/25/15 0900  Gross per 24 hour  Intake      0 ml  Output      0 ml  Net      0 ml   Filed Weights   05/22/15 0050 05/22/15 0510  Weight: 77.111 kg (170 lb) 77.1 kg (169 lb 15.6 oz)     Exam: General: A/O 4, NAD, No acute respiratory distress Lungs: diminshed Cardiovascular: Regular rate and rhythm without murmur gallop or rub normal S1 and S2 Abdomen:negative abdominal pain, negative dysphagia, nondistended, positive soft, bowel sounds, no rebound, no ascites, no appreciable mass, well-healed surgical incision c/w renal transplant. Extremities: No significant cyanosis,  clubbing. Positive edema bilateral lower extremities 1-2+     Data Reviewed: Basic Metabolic Panel:  Recent Labs Lab 05/22/15 0100 05/22/15 0350 05/23/15 0545 05/25/15 0701  NA 141 142 143 143  K 3.4* 3.1* 3.4* 4.2  CL 108 114* 114* 114*  CO2 19* 18* 19* 21*  GLUCOSE 144* 144* 109* 98  BUN 23* $Remov'19 13 10  'HszkUZ$ CREATININE 1.88* 1.66* 1.47* 1.26*  CALCIUM 9.4 8.3* 8.5* 9.1  MG  --   --  1.6*  --    Liver Function Tests:  Recent  Labs Lab 05/22/15 0100 05/23/15 0545  AST 36 29  ALT 15 17  ALKPHOS 51 40  BILITOT 0.7 0.8  PROT 6.0* 5.4*  ALBUMIN 3.2* 2.6*   No results for input(s): LIPASE, AMYLASE in the last 168 hours. No results for input(s): AMMONIA in the last 168 hours. CBC:  Recent Labs Lab 05/22/15 0100 05/22/15 0350 05/23/15 0545 05/25/15 0701  WBC 11.6* 10.8* 7.4 6.3  NEUTROABS 9.0*  --  5.7  --   HGB 12.4 11.4* 10.6* 10.9*  HCT 37.3 35.5* 33.4* 34.5*  MCV 92.6 93.2 94.9 96.6  PLT 192 171 162 151   Cardiac Enzymes: No results for input(s): CKTOTAL, CKMB, CKMBINDEX, TROPONINI in the last 168 hours. BNP (last 3 results) No results for input(s): BNP in the last 8760 hours.  ProBNP (last 3 results) No results for input(s): PROBNP in the last 8760 hours.  CBG: No results for input(s): GLUCAP in the last 168 hours.  Recent Results (from the past 240 hour(s))  Culture, blood (routine x 2)     Status: None (Preliminary result)   Collection Time: 05/22/15  1:00 AM  Result Value Ref Range Status   Specimen Description BLOOD RIGHT FOREARM  Final   Special Requests BOTTLES DRAWN AEROBIC AND ANAEROBIC 5CC  Final   Culture NO GROWTH 2 DAYS  Final   Report Status PENDING  Incomplete  Urine culture     Status: None   Collection Time: 05/22/15  1:14 AM  Result Value Ref Range Status   Specimen Description URINE, CATHETERIZED  Final   Special Requests NONE  Final   Culture   Final    >=100,000 COLONIES/mL ESCHERICHIA COLI Confirmed Extended Spectrum Beta-Lactamase Producer (ESBL)    Report Status 05/24/2015 FINAL  Final   Organism ID, Bacteria ESCHERICHIA COLI  Final      Susceptibility   Escherichia coli - MIC*    AMPICILLIN >=32 RESISTANT Resistant     CEFAZOLIN >=64 RESISTANT Resistant     CEFTRIAXONE >=64 RESISTANT Resistant     CIPROFLOXACIN >=4 RESISTANT Resistant     GENTAMICIN <=1 SENSITIVE Sensitive     IMIPENEM <=0.25 SENSITIVE Sensitive     NITROFURANTOIN <=16 SENSITIVE  Sensitive     TRIMETH/SULFA >=320 RESISTANT Resistant     AMPICILLIN/SULBACTAM >=32 RESISTANT Resistant     PIP/TAZO <=4 SENSITIVE Sensitive     * >=100,000 COLONIES/mL ESCHERICHIA COLI  Culture, blood (routine x 2)     Status: None (Preliminary result)   Collection Time: 05/22/15  1:16 AM  Result Value Ref Range Status   Specimen Description BLOOD RIGHT ANTECUBITAL  Final   Special Requests BOTTLES DRAWN AEROBIC AND ANAEROBIC 10CC  Final   Culture NO GROWTH 2 DAYS  Final   Report Status PENDING  Incomplete  MRSA PCR Screening     Status: None   Collection Time: 05/22/15  5:16 AM  Result Value Ref Range Status  MRSA by PCR NEGATIVE NEGATIVE Final    Comment:        The GeneXpert MRSA Assay (FDA approved for NASAL specimens only), is one component of a comprehensive MRSA colonization surveillance program. It is not intended to diagnose MRSA infection nor to guide or monitor treatment for MRSA infections.      Studies: No results found.  Scheduled Meds: . acidophilus  1 capsule Oral Daily  . amLODipine  5 mg Oral Daily  . antiseptic oral rinse  7 mL Mouth Rinse BID  . azaTHIOprine  50 mg Oral BID  . budesonide-formoterol  2 puff Inhalation Daily  . cloNIDine  0.1 mg Oral BID  . cycloSPORINE modified  75 mg Oral TID  . darifenacin  7.5 mg Oral q morning - 10a  . ertapenem  1 g Intravenous Q24H  . ipratropium-albuterol  3 mL Nebulization 3 times per day  . levothyroxine  75 mcg Oral QAC breakfast  . metoprolol succinate  100 mg Oral Daily  . mirabegron ER  25 mg Oral QPM  . oxybutynin  10 mg Oral QHS  . pantoprazole  40 mg Oral Daily  . predniSONE  5 mg Oral Q breakfast  . Warfarin - Pharmacist Dosing Inpatient   Does not apply q1800   Continuous Infusions:    Principal Problem:   Sepsis secondary to UTI Active Problems:   History of renal transplant   Hypertension   Hypothyroidism   Urinary tract infection   Sepsis   DVT (deep venous thrombosis)   AKI  (acute kidney injury)   CKD (chronic kidney disease) stage 3, GFR 30-59 ml/min   Escherichia coli urinary tract infection   Other emphysema   Hypokalemia   Hypomagnesemia    Time spent: 25 minutes    Jordayn Mink, [facility] Pager [phone]. If 7PM-7AM, please contact night-coverage at [email or web address], password [facility] 05/25/2015, 10:31 AM  LOS: 3 days

## 2015-05-26 ENCOUNTER — Inpatient Hospital Stay (HOSPITAL_COMMUNITY): Payer: Medicare Other

## 2015-05-26 DIAGNOSIS — N390 Urinary tract infection, site not specified: Secondary | ICD-10-CM

## 2015-05-26 DIAGNOSIS — A419 Sepsis, unspecified organism: Secondary | ICD-10-CM

## 2015-05-26 DIAGNOSIS — B9620 Unspecified Escherichia coli [E. coli] as the cause of diseases classified elsewhere: Secondary | ICD-10-CM

## 2015-05-26 LAB — URINALYSIS, ROUTINE W REFLEX MICROSCOPIC
Bilirubin Urine: NEGATIVE
GLUCOSE, UA: NEGATIVE mg/dL
KETONES UR: NEGATIVE mg/dL
NITRITE: NEGATIVE
PH: 5 (ref 5.0–8.0)
Protein, ur: NEGATIVE mg/dL
SPECIFIC GRAVITY, URINE: 1.007 (ref 1.005–1.030)
Urobilinogen, UA: 0.2 mg/dL (ref 0.0–1.0)

## 2015-05-26 LAB — PROTIME-INR
INR: 2.41 — ABNORMAL HIGH (ref 0.00–1.49)
Prothrombin Time: 25.9 seconds — ABNORMAL HIGH (ref 11.6–15.2)

## 2015-05-26 LAB — URINE MICROSCOPIC-ADD ON

## 2015-05-26 MED ORDER — ADULT MULTIVITAMIN W/MINERALS CH
1.0000 | ORAL_TABLET | Freq: Every day | ORAL | Status: DC
  Administered 2015-05-26 – 2015-05-27 (×2): 1 via ORAL
  Filled 2015-05-26 (×2): qty 1

## 2015-05-26 MED ORDER — FLUOXETINE HCL 20 MG PO TABS
20.0000 mg | ORAL_TABLET | Freq: Every day | ORAL | Status: DC
  Administered 2015-05-26 – 2015-05-27 (×2): 20 mg via ORAL
  Filled 2015-05-26 (×2): qty 1

## 2015-05-26 MED ORDER — FUROSEMIDE 10 MG/ML PO SOLN
40.0000 mg | Freq: Two times a day (BID) | ORAL | Status: DC
  Administered 2015-05-27: 40 mg via ORAL
  Filled 2015-05-26: qty 5
  Filled 2015-05-26 (×2): qty 4

## 2015-05-26 MED ORDER — WARFARIN SODIUM 2.5 MG PO TABS
2.5000 mg | ORAL_TABLET | Freq: Once | ORAL | Status: AC
  Administered 2015-05-26: 2.5 mg via ORAL
  Filled 2015-05-26: qty 1

## 2015-05-26 MED ORDER — COLESEVELAM HCL 625 MG PO TABS
1875.0000 mg | ORAL_TABLET | Freq: Two times a day (BID) | ORAL | Status: DC
  Administered 2015-05-26 – 2015-05-27 (×2): 1875 mg via ORAL
  Filled 2015-05-26 (×4): qty 3

## 2015-05-26 MED ORDER — ZOLPIDEM TARTRATE 5 MG PO TABS
5.0000 mg | ORAL_TABLET | Freq: Once | ORAL | Status: AC
  Administered 2015-05-26: 5 mg via ORAL
  Filled 2015-05-26: qty 1

## 2015-05-26 MED ORDER — FENOFIBRATE 54 MG PO TABS
54.0000 mg | ORAL_TABLET | Freq: Every day | ORAL | Status: DC
  Administered 2015-05-26 – 2015-05-27 (×2): 54 mg via ORAL
  Filled 2015-05-26 (×2): qty 1

## 2015-05-26 NOTE — Progress Notes (Signed)
Physical Therapy Treatment Patient Details Name: Patricia [name] MRN: [id number] DOB: 03-15-1932 Today's Date: 05/26/2015    History of Present Illness Pt adm with UTI and sepsis. PMH - kidney transplant, DVT    PT Comments    Pt was able to be seen for visit but very limited by O2 sats.  Her husband was in attendance and noted the losses of sats as well.  Will continue to see for gait and transfers/balance as tolerated, but with close monitoring of vitals.  Follow Up Recommendations  SNF     Equipment Recommendations  None recommended by PT    Recommendations for Other Services OT consult     Precautions / Restrictions Precautions Precautions: Fall Restrictions Weight Bearing Restrictions: No    Mobility  Bed Mobility Overal bed mobility: Needs Assistance Bed Mobility: Supine to Sit     Supine to sit: Min assist;Mod assist Sit to supine: Mod assist   General bed mobility comments: help with trunk and legs, husband in to be supportive  Transfers                 General transfer comment: unable due to drops of O2 sats wtih sitting  Ambulation/Gait             General Gait Details: unable due to sats   Stairs            Wheelchair Mobility    Modified Rankin (Stroke Patients Only)       Balance Overall balance assessment: Needs assistance Sitting-balance support: Feet supported Sitting balance-Leahy Scale: Poor Sitting balance - Comments: Pt was desaturating with the effort of sitting and had to change her gown due to urine incontinence                            Cognition Arousal/Alertness: Awake/alert Behavior During Therapy: WFL for tasks assessed/performed Overall Cognitive Status: Within Functional Limits for tasks assessed                      Exercises      General Comments General comments (skin integrity, edema, etc.): Pt was wet when PT arrived and on continuous O2 sat monitoring.  Had 84% saturation  sitting and then back to 90% with rest, but had to return to bed due to low sats and fatigue.      Pertinent Vitals/Pain Pain Assessment: No/denies pain    Home Living                      Prior Function            PT Goals (current goals can now be found in the care plan section) Acute Rehab PT Goals Patient Stated Goal: return home Progress towards PT goals: Progressing toward goals    Frequency  Min 3X/week    PT Plan Current plan remains appropriate    Co-evaluation             End of Session Equipment Utilized During Treatment: Oxygen (2L per nsg having cut back) Activity Tolerance: Patient limited by fatigue;Patient limited by lethargy;Treatment limited secondary to medical complications (Comment);Other (comment) (Low O2 with cannula air down to 2L) Patient left: in bed;with call bell/phone within reach;with bed alarm set;with family/visitor present     Time: [id number] PT Time Calculation (min) (ACUTE ONLY): 17 min  Charges:  $Therapeutic Activity: 8-22 mins  G Codes:      Ramond Dial 05/26/2015, 7:02 PM   Mee Hives, PT MS Acute Rehab Dept. Number: [facility] [phone] and [place] [phone]

## 2015-05-26 NOTE — Care Management Important Message (Signed)
Important Message  Patient Details  Name: Patricia [name] MRN: [id number] Date of Birth: [birth year]   Medicare Important Message Given:  Yes-third notification given    Zenon Mayo, RN 05/26/2015, 11:38 AMImportant Message  Patient Details  Name: Patricia [name] MRN: [id number] Date of Birth: 1932/07/08   Medicare Important Message Given:  Yes-third notification given    Zenon Mayo, RN 05/26/2015, 11:38 AM

## 2015-05-26 NOTE — Progress Notes (Signed)
Patient requesting something to help her sleep. NP on call paged. [facility] [employer]

## 2015-05-26 NOTE — Progress Notes (Signed)
ANTICOAGULATION CONSULT NOTE - Follow Up Consult  Pharmacy Consult for Warfarin Indication: Hx of DVT  Allergies  Allergen Reactions  . Aricept [Donepezil Hcl] Nausea And Vomiting  . Codeine Nausea And Vomiting  . Penicillins Other (See Comments)    Doesn't remember  . Statins Other (See Comments)    Doesn't remember  . Streptomycin Other (See Comments)    unknown  . Tetracyclines & Related Rash    Patient Measurements: Height: 5\' 5"  (165.1 cm) Weight: 169 lb 15.6 oz (77.1 kg) IBW/kg (Calculated) : 57   Vital Signs: Temp: 97.8 F (36.6 C) (08/11 0606) Temp Source: Oral (08/11 0606) BP: 144/49 mmHg (08/11 0606) Pulse Rate: 62 (08/11 0606)  Labs:  Recent Labs  05/24/15 0649 05/25/15 0701 05/26/15 0550  HGB  --  10.9*  --   HCT  --  34.5*  --   PLT  --  151  --   LABPROT 21.5* 22.4* 25.9*  INR 1.87* 1.98* 2.41*  CREATININE  --  1.26*  --     Estimated Creatinine Clearance: 35.9 mL/min (by C-G formula based on Cr of 1.26).   Medications:  Scheduled:  . acidophilus  1 capsule Oral Daily  . amLODipine  5 mg Oral Daily  . antiseptic oral rinse  7 mL Mouth Rinse BID  . azaTHIOprine  50 mg Oral BID  . budesonide-formoterol  2 puff Inhalation Daily  . cloNIDine  0.1 mg Oral BID  . cycloSPORINE modified  75 mg Oral TID  . darifenacin  7.5 mg Oral q morning - 10a  . ertapenem  1 g Intravenous Q24H  . furosemide  40 mg Oral Q supper  . furosemide  80 mg Oral Q breakfast  . ipratropium-albuterol  3 mL Nebulization 3 times per day  . levothyroxine  75 mcg Oral QAC breakfast  . metoprolol succinate  100 mg Oral Daily  . mirabegron ER  25 mg Oral QPM  . oxybutynin  10 mg Oral QHS  . pantoprazole  40 mg Oral Daily  . predniSONE  5 mg Oral Q breakfast  . Warfarin - Pharmacist Dosing Inpatient   Does not apply q1800    Assessment: 81 y/o F on warfarin for hx of DVT, INR on admit is sub-therapeutic at 1.45. INR improving but still low at 1.98 on Am labs. No s/sx  bleeding noted.  INR now therapeutic at 2.41 following four doses of 5mg . Will decrease dose tonight.  Goal of Therapy:  INR 2-3 Monitor platelets by anticoagulation protocol: Yes   Plan:  Coumadin 2.5mg  tonight x1 [place] INR, CBC, s/sx bleeding  Andrey Cota. Diona Foley, PharmD Clinical Pharmacist Pager [phone] 05/26/2015 9:04 AM

## 2015-05-26 NOTE — Care Management Important Message (Signed)
Important Message  Patient Details  Name: Patricia [name] MRN: [id number] Date of Birth: 06/04/1932   Medicare Important Message Given:  Yes-third notification given    Nathen May 05/26/2015, 3:02 [place] Message  Patient Details  Name: Patricia [name] MRN: [id number] Date of Birth: Jan 16, 1932   Medicare Important Message Given:  Yes-third notification given    Nathen May 05/26/2015, 3:02 PM

## 2015-05-26 NOTE — Progress Notes (Signed)
[facility] PROGRESS NOTE  [name] [id number] DOB: [birth year] DOA: 05/22/2015 PCP: [name], MD  Patricia [name] is a 81 y.o. WF PMHx Depression, Anxiety, COPD, HTN, CAD in native artery, Hypothyroidism Rt renal transplant, DVT last year, frequent urinary tract infections.  The patient told her husband that she was not feeling well. They were on vacation and he decided to bring her home. Tonight she began to shake in the bed and he decided to call EMS. Upon arrival to the ER temperature was noted to be 103 she was found to have a positive UA. She was tachycardic and also quite confused initially. She is now alert and oriented and has no complaints. Her husband states that she had an episode of loose stool. No respiratory symptoms. She was admitted for sepsis in relation to UTI.  HPI/Subjective: SOB- on 4L  +2.3 L since admission   Assessment/Plan: Sepsis secondary Escherichia coli UTI, ESBL Spiked low grade fever yesterday. Continue invanz today then fosfomycin tomorrow and in 72 hours. Discussed with pharmacy. Will cover ESBL e coli. To snf tomorrow if stable. Repeat UA  Acute resp failure Unable to wean oxygen. Repeat CXR   Hx Renal transplant -Continue Imuran 50 mg BID -Cyclosporine 75 mg TID; check levels -Darifenacin 7.5 mg daily -Prednisone 5 mg daily  Acute on chronic renal failure (baseline Cr~1.14-1.26) improved with hydration  Hypertension -Stable -Continue amlodipine 5 mg daily  -Continue clonidine 0.1 mg  BID -Continue Toprol 100 mg daily   Hypothyroidism -Continue Synthroid  DVT (deep venous thrombosis) -Right leg last year;Continue Coumadin per pharmacy  COPD -Continue Symbicort 160-4.5 BID -DuoNeb TID -Ambulatory SPO2  Urinary incontinence -Continue Enablex and oxybutynin   Hypokalemia corrected  Hypomagnesemia -Magnesium oxide 400 mg x 1     Antibiotics: Azactam 8/71 dose Levaquin 8/71 dose Vancomycin  8/7>>8/9 Ertapenem 8/7>>   DVT prophylaxis Warfarin  No complaints  Objective: Filed Vitals:   05/26/15 1010 05/26/15 1042 05/26/15 1446 05/26/15 1526  BP: 138/56   137/51  Pulse: 65   67  Temp:    98 F (36.7 C)  TempSrc:    Oral  Resp:    16  Height:      Weight:      SpO2:  96% 97% 96%    Intake/Output Summary (Last 24 hours) at 05/26/15 1743 Last data filed at 05/26/15 1637  Gross per 24 hour  Intake    240 ml  Output      0 ml  Net    240 ml   Filed Weights   05/22/15 0050 05/22/15 0510  Weight: 77.111 kg (170 lb) 77.1 kg (169 lb 15.6 oz)     Exam: General: A appropriate alert. forgetful Lungs: diminshed Cardiovascular: Regular rate and rhythm without murmur gallop or rub normal S1 and S2 Abdomen:negative abdominal pain, negative dysphagia, nondistended, positive soft, bowel sounds, no rebound, no ascites, no appreciable mass, well-healed surgical incision c/w renal transplant. Extremities: No significant cyanosis, clubbing. Positive edema bilateral lower extremities 1-2+  Data Reviewed: Basic Metabolic Panel:  Recent Labs Lab 05/22/15 0100 05/22/15 0350 05/23/15 0545 05/25/15 0701  NA 141 142 143 143  K 3.4* 3.1* 3.4* 4.2  CL 108 114* 114* 114*  CO2 19* 18* 19* 21*  GLUCOSE 144* 144* 109* 98  BUN 23* 19 13 10   CREATININE 1.88* 1.66* 1.47* 1.26*  CALCIUM 9.4 8.3* 8.5* 9.1  MG  --   --  1.6*  --    Liver  Function Tests:  Recent Labs Lab 05/22/15 0100 05/23/15 0545  AST 36 29  ALT 15 17  ALKPHOS 51 40  BILITOT 0.7 0.8  PROT 6.0* 5.4*  ALBUMIN 3.2* 2.6*   No results for input(s): LIPASE, AMYLASE in the last 168 hours. No results for input(s): AMMONIA in the last 168 hours. CBC:  Recent Labs Lab 05/22/15 0100 05/22/15 0350 05/23/15 0545 05/25/15 0701  WBC 11.6* 10.8* 7.4 6.3  NEUTROABS 9.0*  --  5.7  --   HGB 12.4 11.4* 10.6* 10.9*  HCT 37.3 35.5* 33.4* 34.5*  MCV 92.6 93.2 94.9 96.6  PLT 192 171 162 151   Cardiac  Enzymes: No results for input(s): CKTOTAL, CKMB, CKMBINDEX, TROPONINI in the last 168 hours. BNP (last 3 results) No results for input(s): BNP in the last 8760 hours.  ProBNP (last 3 results) No results for input(s): PROBNP in the last 8760 hours.  CBG: No results for input(s): GLUCAP in the last 168 hours.  Recent Results (from the past 240 hour(s))  Culture, blood (routine x 2)     Status: None (Preliminary result)   Collection Time: 05/22/15  1:00 AM  Result Value Ref Range Status   Specimen Description BLOOD RIGHT FOREARM  Final   Special Requests BOTTLES DRAWN AEROBIC AND ANAEROBIC 5CC  Final   Culture NO GROWTH 4 DAYS  Final   Report Status PENDING  Incomplete  Urine culture     Status: None   Collection Time: 05/22/15  1:14 AM  Result Value Ref Range Status   Specimen Description URINE, CATHETERIZED  Final   Special Requests NONE  Final   Culture   Final    >=100,000 COLONIES/mL ESCHERICHIA COLI Confirmed Extended Spectrum Beta-Lactamase Producer (ESBL)    Report Status 05/24/2015 FINAL  Final   Organism ID, Bacteria ESCHERICHIA COLI  Final      Susceptibility   Escherichia coli - MIC*    AMPICILLIN >=32 RESISTANT Resistant     CEFAZOLIN >=64 RESISTANT Resistant     CEFTRIAXONE >=64 RESISTANT Resistant     CIPROFLOXACIN >=4 RESISTANT Resistant     GENTAMICIN <=1 SENSITIVE Sensitive     IMIPENEM <=0.25 SENSITIVE Sensitive     NITROFURANTOIN <=16 SENSITIVE Sensitive     TRIMETH/SULFA >=320 RESISTANT Resistant     AMPICILLIN/SULBACTAM >=32 RESISTANT Resistant     PIP/TAZO <=4 SENSITIVE Sensitive     * >=100,000 COLONIES/mL ESCHERICHIA COLI  Culture, blood (routine x 2)     Status: None (Preliminary result)   Collection Time: 05/22/15  1:16 AM  Result Value Ref Range Status   Specimen Description BLOOD RIGHT ANTECUBITAL  Final   Special Requests BOTTLES DRAWN AEROBIC AND ANAEROBIC 10CC  Final   Culture NO GROWTH 4 DAYS  Final   Report Status PENDING  Incomplete   MRSA PCR Screening     Status: None   Collection Time: 05/22/15  5:16 AM  Result Value Ref Range Status   MRSA by PCR NEGATIVE NEGATIVE Final    Comment:        The GeneXpert MRSA Assay (FDA approved for NASAL specimens only), is one component of a comprehensive MRSA colonization surveillance program. It is not intended to diagnose MRSA infection nor to guide or monitor treatment for MRSA infections.      Studies: Dg Chest Port 1 View  05/25/2015   CLINICAL DATA:  SOB (shortness of breath) R06.02 (ICD-10-CM) followup exam.  EXAM: PORTABLE CHEST - 1 VIEW  COMPARISON:  05/22/2015  FINDINGS: Since prior study, vascular congestion has developed with mild interstitial thickening most evident centrally and in the lung bases. Hazy opacity at both lung bases is also noted consistent with small effusions.  Mild enlargement of the cardiopericardial silhouette is stable. No mediastinal or hilar masses. No convincing adenopathy. No pneumothorax.  IMPRESSION: 1. Vascular congestion with mild central and lung base interstitial thickening and small pleural effusion, developing since prior exam. Findings consistent with mild congestive heart failure.   Electronically Signed   By: [name] M.D.   On: 05/25/2015 11:45    Scheduled Meds: . acidophilus  1 capsule Oral Daily  . amLODipine  5 mg Oral Daily  . antiseptic oral rinse  7 mL Mouth Rinse BID  . azaTHIOprine  50 mg Oral BID  . budesonide-formoterol  2 puff Inhalation Daily  . cloNIDine  0.1 mg Oral BID  . cycloSPORINE modified  75 mg Oral TID  . darifenacin  7.5 mg Oral q morning - 10a  . ertapenem  1 g Intravenous Q24H  . furosemide  40 mg Oral Q supper  . furosemide  80 mg Oral Q breakfast  . ipratropium-albuterol  3 mL Nebulization 3 times per day  . levothyroxine  75 mcg Oral QAC breakfast  . metoprolol succinate  100 mg Oral Daily  . mirabegron ER  25 mg Oral QPM  . oxybutynin  10 mg Oral QHS  . pantoprazole  40 mg Oral Daily   . predniSONE  5 mg Oral Q breakfast  . warfarin  2.5 mg Oral ONCE-1800  . Warfarin - Pharmacist Dosing Inpatient   Does not apply q1800   Continuous Infusions:    Principal Problem:   Sepsis secondary to UTI Active Problems:   History of renal transplant   Hypertension   Hypothyroidism   Urinary tract infection   Sepsis   DVT (deep venous thrombosis)   AKI (acute kidney injury)   CKD (chronic kidney disease) stage 3, GFR 30-59 ml/min   Escherichia coli urinary tract infection   Other emphysema   Hypokalemia   Hypomagnesemia    Time spent: 25 minutes    [facility]  [email or web address], password [facility] 05/26/2015, 5:43 PM  LOS: 4 days

## 2015-05-27 ENCOUNTER — Non-Acute Institutional Stay (SKILLED_NURSING_FACILITY): Payer: Medicare Other | Admitting: Internal Medicine

## 2015-05-27 ENCOUNTER — Encounter: Payer: Self-pay | Admitting: Internal Medicine

## 2015-05-27 DIAGNOSIS — N390 Urinary tract infection, site not specified: Secondary | ICD-10-CM

## 2015-05-27 DIAGNOSIS — A419 Sepsis, unspecified organism: Secondary | ICD-10-CM

## 2015-05-27 DIAGNOSIS — J439 Emphysema, unspecified: Secondary | ICD-10-CM

## 2015-05-27 DIAGNOSIS — I5031 Acute diastolic (congestive) heart failure: Secondary | ICD-10-CM | POA: Insufficient documentation

## 2015-05-27 DIAGNOSIS — E038 Other specified hypothyroidism: Secondary | ICD-10-CM

## 2015-05-27 DIAGNOSIS — Z940 Kidney transplant status: Secondary | ICD-10-CM

## 2015-05-27 DIAGNOSIS — N3000 Acute cystitis without hematuria: Secondary | ICD-10-CM | POA: Diagnosis not present

## 2015-05-27 DIAGNOSIS — N179 Acute kidney failure, unspecified: Secondary | ICD-10-CM

## 2015-05-27 DIAGNOSIS — I824Y9 Acute embolism and thrombosis of unspecified deep veins of unspecified proximal lower extremity: Secondary | ICD-10-CM

## 2015-05-27 DIAGNOSIS — N183 Chronic kidney disease, stage 3 (moderate): Secondary | ICD-10-CM

## 2015-05-27 DIAGNOSIS — R32 Unspecified urinary incontinence: Secondary | ICD-10-CM

## 2015-05-27 DIAGNOSIS — I10 Essential (primary) hypertension: Secondary | ICD-10-CM

## 2015-05-27 DIAGNOSIS — E034 Atrophy of thyroid (acquired): Secondary | ICD-10-CM

## 2015-05-27 LAB — CULTURE, BLOOD (ROUTINE X 2)
CULTURE: NO GROWTH
CULTURE: NO GROWTH

## 2015-05-27 LAB — PROTIME-INR
INR: 2.67 — AB (ref 0.00–1.49)
PROTHROMBIN TIME: 28 s — AB (ref 11.6–15.2)

## 2015-05-27 LAB — CBC
HCT: 34 % — ABNORMAL LOW (ref 36.0–46.0)
Hemoglobin: 11 g/dL — ABNORMAL LOW (ref 12.0–15.0)
MCH: 30.2 pg (ref 26.0–34.0)
MCHC: 32.4 g/dL (ref 30.0–36.0)
MCV: 93.4 fL (ref 78.0–100.0)
PLATELETS: 177 10*3/uL (ref 150–400)
RBC: 3.64 MIL/uL — ABNORMAL LOW (ref 3.87–5.11)
RDW: 14.6 % (ref 11.5–15.5)
WBC: 5.6 10*3/uL (ref 4.0–10.5)

## 2015-05-27 LAB — BASIC METABOLIC PANEL
Anion gap: 8 (ref 5–15)
BUN: 13 mg/dL (ref 6–20)
CO2: 26 mmol/L (ref 22–32)
CREATININE: 1.22 mg/dL — AB (ref 0.44–1.00)
Calcium: 9.1 mg/dL (ref 8.9–10.3)
Chloride: 106 mmol/L (ref 101–111)
GFR, EST AFRICAN AMERICAN: 47 mL/min — AB (ref >60)
GFR, EST NON AFRICAN AMERICAN: 40 mL/min — AB (ref >60)
Glucose, Bld: 99 mg/dL (ref 65–99)
Potassium: 3.5 mmol/L (ref 3.5–5.1)
SODIUM: 140 mmol/L (ref 135–145)

## 2015-05-27 MED ORDER — FOSFOMYCIN TROMETHAMINE 3 G PO PACK: 3.0000 g | PACK | Freq: Once | ORAL | Status: DC

## 2015-05-27 MED ORDER — CYCLOSPORINE MODIFIED (NEORAL) 25 MG PO CAPS
50.0000 mg | ORAL_CAPSULE | Freq: Two times a day (BID) | ORAL | Status: DC
  Administered 2015-05-27: 50 mg via ORAL
  Filled 2015-05-27 (×2): qty 2

## 2015-05-27 MED ORDER — FUROSEMIDE 40 MG PO TABS
80.0000 mg | ORAL_TABLET | Freq: Two times a day (BID) | ORAL | Status: DC
Start: 2015-05-27 — End: 2016-03-16

## 2015-05-27 MED ORDER — WARFARIN SODIUM 5 MG PO TABS: ORAL_TABLET | ORAL | Status: DC

## 2015-05-27 MED ORDER — IPRATROPIUM-ALBUTEROL 0.5-2.5 (3) MG/3ML IN SOLN: 3.0000 mL | RESPIRATORY_TRACT | Status: DC | PRN

## 2015-05-27 MED ORDER — CYCLOSPORINE MODIFIED (NEORAL) 25 MG PO CAPS: 50.0000 mg | ORAL_CAPSULE | Freq: Two times a day (BID) | ORAL | Status: DC

## 2015-05-27 MED ORDER — CYCLOSPORINE MODIFIED (NEORAL) 25 MG PO CAPS: 100.0000 mg | ORAL_CAPSULE | Freq: Two times a day (BID) | ORAL | Status: DC

## 2015-05-27 MED ORDER — FOSFOMYCIN TROMETHAMINE 3 G PO PACK
3.0000 g | PACK | Freq: Once | ORAL | Status: AC
  Administered 2015-05-27: 3 g via ORAL
  Filled 2015-05-27: qty 3

## 2015-05-27 NOTE — Assessment & Plan Note (Signed)
SNF plan -Continue Imuran 50 mg BID -Cyclosporine 75 mg twice a day at home. Level checked here was slightly elevated at 420. Will decrease to 50 mg twice daily. -Prednisone 5 mg daily

## 2015-05-27 NOTE — Progress Notes (Signed)
MRN: [id number] Name: MARIAL SILLER  Sex: female Age: 81 y.o. DOB: 12-31-1931  [facility] #: [facility] Facility/Room:100 Level Of Care: SNF Provider: Inocencio Homes D Emergency Contacts: Extended Emergency Contact Information Primary Emergency Contact: Mcleary,Howard Address: [address]          [place] Phone: [phone] Mobile Phone: [phone] Relation: Spouse Secondary Emergency Contact: Franki Cabot, [place] Mobile Phone: [phone] Relation: Son  Code Status:   Allergies: Macrolides and ketolides; Aricept; Codeine; Penicillins; Statins; Streptomycin; and Tetracyclines & related  Chief Complaint  Patient presents with  . New Admit To SNF    HPI: Patient is 81 y.o. female with Depression, Anxiety, COPD, HTN, CAD in native artery, Hypothyroidism Rt renal transplant, DVT last year, frequent urinary tract infections who was admitted to hospital from 8/7-12 for urosepsis 2/2 E Coli, ESBL, complicated by AKI and acute resp failure from acute on chronic diastolic CHF. Pt is admitted to SNF for generalized weakness from hospitalization and severe disease. While at SNF pt will be followed for her renal transplant status, tx with multiple imunosuppresive meds, HTN, tx with multiple meds and s/p DVT tx with coumadin.  Past Medical History  Diagnosis Date  . Renal disorder   . Hx of kidney transplant     right  . Coronary artery disease   . Hypertension   . Thyroid disease   . Multiple allergies   . Abnormal gait   . Reflux   . Diverticulitis   . Hypothyroidism   . Depression   . Anxiety   . COPD, mild   . Dizziness   . Diabetes   . DVT (deep venous thrombosis)     Past Surgical History  Procedure Laterality Date  . Nephrectomy transplanted organ    . Tumor removal      Brain       Medication List       This list is accurate as of: 05/27/15  8:57 PM.  Always use your most recent med  list.               albuterol 108 (90 BASE) MCG/ACT inhaler  Commonly known as:  PROVENTIL HFA;VENTOLIN HFA  Inhale 2 puffs into the lungs every 6 (six) hours as needed for wheezing or shortness of breath. Patient uses this medication prn.     ALIGN 4 MG Caps  Take 1 tablet by mouth daily.     amLODipine 5 MG tablet  Commonly known as:  NORVASC  Take 5 mg by mouth daily.     azaTHIOprine 50 MG tablet  Commonly known as:  IMURAN  Take 50 mg by mouth 2 (two) times daily.     budesonide-formoterol 160-4.5 MCG/ACT inhaler  Commonly known as:  SYMBICORT  Inhale 2 puffs into the lungs daily.     cetirizine 10 MG tablet  Commonly known as:  ZYRTEC  Take 10 mg by mouth every morning.     cloNIDine 0.1 MG tablet  Commonly known as:  CATAPRES  Take 0.1 mg by mouth 2 (two) times daily.     Co Q 10 100 MG Caps  Take 1 tablet by mouth every evening.     colesevelam 625 MG tablet  Commonly known as:  WELCHOL  Take 1,875 mg by mouth 2 (two) times daily with a meal.     cycloSPORINE modified 25 MG capsule  Commonly  known as:  NEORAL  Take 2 capsules (50 mg total) by mouth 2 (two) times daily.     ENABLEX 7.5 MG 24 hr tablet  Generic drug:  darifenacin  Take 7.5 mg by mouth every morning.     fenofibrate 48 MG tablet  Commonly known as:  TRICOR  Take 48 mg by mouth daily.     FLUoxetine 20 MG tablet  Commonly known as:  PROZAC  Take 20 mg by mouth daily.     fluticasone 50 MCG/ACT nasal spray  Commonly known as:  FLONASE  Place 1 spray into both nostrils daily as needed for allergies or rhinitis.     fosfomycin 3 G Pack  Commonly known as:  MONUROL  Take 3 g by mouth once.  Start taking on:  05/30/2015     furosemide 40 MG tablet  Commonly known as:  LASIX  Take 2 tablets (80 mg total) by mouth 2 (two) times daily. 80mg  in the morning and 40mg  in the evening     hyoscyamine 0.125 MG Tbdp disintergrating tablet  Commonly known as:  ANASPAZ  Place 0.125 mg under the  tongue every 6 (six) hours as needed for bladder spasms. Patient uses this medication prn.     L-Lysine 500 MG Tabs  Take 500 mg by mouth daily.     levothyroxine 75 MCG tablet  Commonly known as:  SYNTHROID, LEVOTHROID  Take 75 mcg by mouth daily.     metoprolol succinate 100 MG 24 hr tablet  Commonly known as:  TOPROL-XL  Take 100 mg by mouth daily. Take with or immediately following a meal.     multivitamin tablet  Take 1 tablet by mouth daily.     MYRBETRIQ 25 MG Tb24 tablet  Generic drug:  mirabegron ER  Take 25 mg by mouth every evening.     omega-3 fish oil 1000 MG Caps capsule  Commonly known as:  MAXEPA  Take 1 capsule by mouth daily.     omeprazole 20 MG capsule  Commonly known as:  PRILOSEC  Take 20 mg by mouth daily.     ondansetron 4 MG tablet  Commonly known as:  ZOFRAN  Take 4 mg by mouth every 8 (eight) hours as needed for nausea or vomiting.     oxybutynin 10 MG 24 hr tablet  Commonly known as:  DITROPAN-XL  Take 10 mg by mouth at bedtime.     POTASSIUM CHLORIDE PO  Take 300 mcg by mouth every evening.     predniSONE 5 MG tablet  Commonly known as:  DELTASONE  Take 5 mg by mouth daily with breakfast.     Resveratrol 100 MG Caps  Take 1 capsule by mouth daily.     sulfamethoxazole-trimethoprim 400-80 MG per tablet  Commonly known as:  BACTRIM,SEPTRA  Take 1 tablet by mouth 3 (three) times a week. Monday, Wednesday, and Friday.     Vitamin D 2000 UNITS tablet  Take 2,000 Units by mouth every evening.     warfarin 5 MG tablet  Commonly known as:  COUMADIN  Alternate 2.5 mg and 5 mg daily.        No orders of the defined types were placed in this encounter.     There is no immunization history on file for this patient.  Social History  Substance Use Topics  . Smoking status: Never Smoker   . Smokeless tobacco: Never Used  . Alcohol Use: No    Family history is  +  alzheimers, hyperlipidemia, ETOH abuse  Review of Systems  DATA  OBTAINED: from patient, nurse, medical record GENERAL:  no fevers, +fatigue, appetite changes SKIN: No itching, rash EYES: No eye pain, redness, discharge EARS: No earache, tinnitus, change in hearing NOSE: No congestion, drainage or bleeding  MOUTH/THROAT: No mouth or tooth pain, No sore throat RESPIRATORY: No cough, wheezing, SOB CARDIAC: No chest pain, palpitations, lower extremity edema  GI: No abdominal pain, No N/V/D or constipation, No heartburn or reflux  GU: No dysuria, frequency or urgency, or incontinence  MUSCULOSKELETAL: No unrelieved bone/joint pain NEUROLOGIC: No headache, dizziness or focal weakness PSYCHIATRIC: No c/o anxiety or sadness   Filed Vitals:   05/27/15 2004  BP: 127/69  Pulse: 64  Temp: 97.8 F (36.6 C)  Resp: 20    SpO2 Readings from Last 1 Encounters:  05/27/15 85%        Physical Exam  GENERAL APPEARANCE: WF  No acute distress.  SKIN: No diaphoresis rash HEAD: Normocephalic, atraumatic  EYES: Conjunctiva/lids clear. Pupils round, reactive. EOMs intact.  EARS: External exam WNL, canals clear. Hearing grossly normal.  NOSE: No deformity or discharge.  MOUTH/THROAT: Lips w/o lesions  RESPIRATORY: Breathing is even, unlabored. Lung sounds are clear but diffusely dec CARDIOVASCULAR: Heart RRR no murmurs, rubs or gallops. trace peripheral edema.   GASTROINTESTINAL: Abdomen is soft, non-tender, not distended w/ normal bowel sounds. GENITOURINARY: Bladder non tender, not distended  MUSCULOSKELETAL: No abnormal joints or musculature NEUROLOGIC:  Cranial nerves 2-12 grossly intact. Moves all extremities  PSYCHIATRIC: Mood and affect appropriate to situation, no behavioral issues  Patient Active Problem List   Diagnosis Date Noted  . Acute diastolic CHF (congestive heart failure) 05/27/2015  . Urinary incontinence 05/27/2015  . Escherichia coli urinary tract infection   . Other emphysema   . Hypokalemia   . Hypomagnesemia   . Sepsis  secondary to UTI 05/22/2015  . DVT, lower extremity, proximal 03/02/2014  . CKD (chronic kidney disease) stage 3, GFR 30-59 ml/min 03/02/2014  . Unspecified essential hypertension 03/02/2014  . DVT (deep venous thrombosis) 03/01/2014  . Acute renal failure superimposed on stage 3 chronic kidney disease 03/01/2014  . Gastroenteritis 11/22/2013  . Nausea & vomiting 11/22/2013  . Hypercalcemia 11/22/2013  . Infection due to ESBL-producing Escherichia coli 10/07/2013  . Septicemia due to E. coli 10/07/2013  . Urinary tract infection 10/05/2013  . Sepsis 10/05/2013  . History of renal transplant   . Hx of kidney transplant   . Hypertension   . Thyroid disease   . Multiple allergies   . Abnormal gait   . Reflux   . Diverticulitis   . Hypothyroidism   . Depression   . Anxiety   . COPD (chronic obstructive pulmonary disease)   . Dizziness   . Diabetes     CBC    Component Value Date/Time   WBC 5.6 05/27/2015 0622   RBC 3.64* 05/27/2015 0622   HGB 11.0* 05/27/2015 0622   HCT 34.0* 05/27/2015 0622   PLT 177 05/27/2015 0622   MCV 93.4 05/27/2015 0622   LYMPHSABS 1.0 05/23/2015 0545   MONOABS 0.5 05/23/2015 0545   EOSABS 0.1 05/23/2015 0545   BASOSABS 0.0 05/23/2015 0545    CMP     Component Value Date/Time   NA 140 05/27/2015 0622   K 3.5 05/27/2015 0622   CL 106 05/27/2015 0622   CO2 26 05/27/2015 0622   GLUCOSE 99 05/27/2015 0622   BUN 13 05/27/2015 0622   CREATININE  1.22* 05/27/2015 0622   CALCIUM 9.1 05/27/2015 0622   PROT 5.4* 05/23/2015 0545   ALBUMIN 2.6* 05/23/2015 0545   AST 29 05/23/2015 0545   ALT 17 05/23/2015 0545   ALKPHOS 40 05/23/2015 0545   BILITOT 0.8 05/23/2015 0545   GFRNONAA 40* 05/27/2015 0622   GFRAA 47* 05/27/2015 0622    Lab Results  Component Value Date   HGBA1C 6.2* 03/02/2014     Dg Chest Portable 1 View  05/22/2015   CLINICAL DATA:  Sepsis  EXAM: PORTABLE CHEST - 1 VIEW  COMPARISON:  03/01/2014  FINDINGS: A single AP portable  view of the chest demonstrates no focal airspace consolidation or alveolar edema. The lungs are grossly clear except for calcified granulomatous changes. There is no large effusion or pneumothorax. Cardiac and mediastinal contours appear unremarkable.  IMPRESSION: No active disease.   Electronically Signed   By: Andreas Newport M.D.   On: 05/22/2015 01:23    Not all labs, radiology exams or other studies done during hospitalization come through on my EPIC note; however they are reviewed by me.    Assessment and Plan  Sepsis secondary to UTI secondary Escherichia coli UTI, ESBL Started on broad-spectrum antibiotics. Blood cultures remained negative. Urine culture grew Escherichia coli, ESBL. Ertapenem continued for a total of about 6 days; fosfomycin, give 1 dose 8/12 priot ro d/c SNF plan - last dose fosfomycin  in 72 hours, 8/15   Urinary tract infection  Escherichia coli UTI, ESBL; Started on broad-spectrum antibiotics. Blood cultures remained negative. Urine culture grew Escherichia coli, ESBL. Ertapenem continued for a total of about 6 days  Ffosfomycin, given 1 dose today prior to d/c  SNF plan; last fosfomycin dose in  72 hours.   Acute diastolic CHF (congestive heart failure) Required oxygen. Chest x-ray during hospitalization showed pulmonary edema. Echocardiogram shows normal ejection fraction but grade 2 diastolic dysfunction. On Lasix at home. Likely from fluid overload secondary to fluid resuscitation during sepsis treatment. Initial chest x-ray negative. Repeat chest x-ray shows improvement in pulmonary edema SNF plan - Lasix 80 mg BID, watch weights and BMP's  Hx of kidney transplant SNF plan -Continue Imuran 50 mg BID -Cyclosporine 75 mg twice a day at home. Level checked here was slightly elevated at 420. Will decrease to 50 mg twice daily. -Prednisone 5 mg daily  Acute renal failure superimposed on stage 3 chronic kidney disease (baseline Cr~1.14-1.26); improved with  hydration; SNF plan - BMP to follow renal fx   Hypertension SNF plan - Stable -Continue amlodipine 5 mg daily  -Continue clonidine 0.1 mg BID -Continue Toprol 100 mg daily   DVT, lower extremity, proximal Right leg last year; subtherapeutic on admission SNF plan - actively titrate coumadin when back to less controlled environment of SNF  Hypothyroidism SNF plan - cont synthroid 75 MCG daily  COPD (chronic obstructive pulmonary disease) SNF plan -Continue Symbicort 160-4.5 BID -DuoNeb TID -Ambulatory SPO2  Urinary incontinence SNF plan - Continue Enablex and oxybutynin    Hennie Duos, MD

## 2015-05-27 NOTE — Assessment & Plan Note (Signed)
Required oxygen. Chest x-ray during hospitalization showed pulmonary edema. Echocardiogram shows normal ejection fraction but grade 2 diastolic dysfunction. On Lasix at home. Likely from fluid overload secondary to fluid resuscitation during sepsis treatment. Initial chest x-ray negative. Repeat chest x-ray shows improvement in pulmonary edema SNF plan - Lasix 80 mg BID, watch weights and BMP's

## 2015-05-27 NOTE — Progress Notes (Signed)
Report given to [employer].  PIV removed.  Pt taken via EMS.

## 2015-05-27 NOTE — Clinical Social Work Placement (Signed)
   CLINICAL SOCIAL WORK PLACEMENT  NOTE  Date:  05/27/2015  Patient Details  Name: Patricia [name] MRN: [id number] Date of Birth: 1932/03/01  Clinical Social Work is seeking post-discharge placement for this patient at the [place] level of care (*CSW will initial, date and re-position this form in  chart as items are completed):  Yes   Patient/family provided with [facility] Work Department's list of facilities offering this level of care within the geographic area requested by the patient (or if unable, by the patient's family).  Yes   Patient/family informed of their freedom to choose among providers that offer the needed level of care, that participate in Medicare, Medicaid or managed care program needed by the patient, have an available bed and are willing to accept the patient.  Yes   Patient/family informed of [facility]'s ownership interest in [facility] and [facility], as well as of the fact that they are under no obligation to receive care at these facilities.  PASRR submitted to EDS on       PASRR number received on       Existing PASRR number confirmed on 05/24/15     FL2 transmitted to all facilities in geographic area requested by pt/family on 05/24/15     FL2 transmitted to all facilities within larger geographic area on       Patient informed that his/her managed care company has contracts with or will negotiate with certain facilities, including the following:   [facility]- Medicare Complete)     Yes   Patient/family informed of bed offers received.  Patient chooses bed at [facility]     Physician recommends and patient chooses bed at      Patient to be transferred to [facility] on 05/27/15.  Patient to be transferred to facility by Ambulance Corey Harold)     Patient family notified on 05/27/15 of transfer.  Name of family member notified:  [name]     PHYSICIAN Please prepare priority  discharge summary, including medications, Please sign FL2, Please prepare prescriptions     Additional Comment:   Per MD patient ready for DC to [facility]. RN, patient, patient's family, and facility notified of DC. RN given number for report. DC packet on chart. Ambulance transport requested for patient. CSW signing off.  _______________________________________________ Liz Beach MSW, [place], [place], JI:[id number]

## 2015-05-27 NOTE — Assessment & Plan Note (Signed)
(  baseline Cr~1.14-1.26); improved with hydration; SNF plan - BMP to follow renal fx

## 2015-05-27 NOTE — Assessment & Plan Note (Signed)
SNF plan - Continue Enablex and oxybutynin

## 2015-05-27 NOTE — Assessment & Plan Note (Signed)
Right leg last year; subtherapeutic on admission SNF plan - actively titrate coumadin when back to less controlled environment of SNF

## 2015-05-27 NOTE — Assessment & Plan Note (Signed)
SNF plan - Stable -Continue amlodipine 5 mg daily  -Continue clonidine 0.1 mg BID -Continue Toprol 100 mg daily

## 2015-05-27 NOTE — Assessment & Plan Note (Signed)
SNF plan - cont synthroid 75 MCG daily

## 2015-05-27 NOTE — Assessment & Plan Note (Signed)
secondary Escherichia coli UTI, ESBL Started on broad-spectrum antibiotics. Blood cultures remained negative. Urine culture grew Escherichia coli, ESBL. Ertapenem continued for a total of about 6 days; fosfomycin, give 1 dose 8/12 priot ro d/c SNF plan - last dose fosfomycin  in 72 hours, 8/15

## 2015-05-27 NOTE — Discharge Summary (Signed)
Physician Discharge Summary  Patricia [name] [id number] DOB: 21-Jan-1932 DOA: 05/22/2015  PCP: [name], MD  Admit date: 05/22/2015 Discharge date: 05/27/2015  Time spent: greater than 30 min  Recommendations for Outpatient Follow-up:  1. Wean oxygen as tolerated 2. Monitor basic metabolic panel periodically  3. monitor INR periodically adjust warfarin to keep between 2 and 3 4. Repeat cyclosporine level at next nephrology follow-up  5.  monitor her weights  Discharge Diagnoses:  Principal Problem:   Sepsis secondary to UTI Active Problems:   History of renal transplant   Hypertension   Hypothyroidism   Sepsis   DVT (deep venous thrombosis)   AKI (acute kidney injury)   CKD (chronic kidney disease) stage 3, GFR 30-59 ml/min   Escherichia coli urinary tract infection, ESBL   Other emphysema   Hypokalemia   Hypomagnesemia Acute hypoxia secondary to acute on chronic diastolic heart failure   Discharge Condition: stable  Diet recommendation: Heart healthy  CODE STATUS: Partial, DO NOT INTUBATE  Filed Weights   05/22/15 0050 05/22/15 0510 05/27/15 0527  Weight: 77.111 kg (170 lb) 77.1 kg (169 lb 15.6 oz) 85.186 kg (187 lb 12.8 oz)    History of present illness/Hospital Course:  81 y.o. WF PMHx Depression, Anxiety, COPD, HTN, CAD in native artery, Hypothyroidism Rt renal transplant, DVT last year, frequent urinary tract infections.  The patient told her husband that she was not feeling well. They were on vacation and he decided to bring her home. Tonight she began to shake in the bed and he decided to call EMS. Upon arrival to the ER temperature was noted to be 103 she was found to have a positive UA. She was tachycardic and also quite confused initially. She is now alert and oriented and has no complaints. Her husband states that she had an episode of loose stool. No respiratory symptoms. She was admitted for sepsis in relation to UTI.  Sepsis secondary  Escherichia coli UTI, ESBL Started on broad-spectrum antibiotics. Blood cultures remained negative. Urine culture grew Escherichia coli, ESBL. Ertapenem continued for a total of about 6 days. Will transition to fosfomycin, give 1 dose today and another in 72 hours.  Acute resp failure Required oxygen. Chest x-ray during hospitalization showed pulmonary edema. Echocardiogram shows normal ejection fraction but grade 2 diastolic dysfunction. On Lasix at home. Likely from fluid overload secondary to fluid resuscitation during sepsis treatment.  Initial chest x-ray negative. Repeat chest x-ray shows improvement in pulmonary edema. Will increase outpatient Lasix to 80 mg twice a day. Please monitor daily weights and basic metabolic panels. Wean oxygen as able.  Hx Renal transplant -Continue Imuran 50 mg BID -Cyclosporine 75 mg twice a day at home. Level checked here was slightly elevated at 420. Will decrease to 50 mg twice daily. -Prednisone 5 mg daily  Acute on chronic renal failure (baseline Cr~1.14-1.26) improved with hydration  Hypertension -Stable -Continue amlodipine 5 mg daily  -Continue clonidine 0.1 mg BID -Continue Toprol 100 mg daily   Hypothyroidism -Continue Synthroid  DVT (deep venous thrombosis) -Right leg last year; subtherapeutic on admission. At discharge, therapeutic. Will need monitoring and adjustment of Coumadin if needed.   COPD -Continue Symbicort 160-4.5 BID -DuoNeb TID -Ambulatory SPO2  Urinary incontinence -Continue Enablex and oxybutynin    Procedures:  None   Consultations:  None  Discharge Exam: Filed Vitals:   05/27/15 0654  BP: 153/61  Pulse: 62  Temp: 98.3 F (36.8 C)  Resp: 20    General: asleep.  Arousable. Oriented. Comfortable. Cardiovascular: regular rate rhythm without murmurs gallops rubs  Respiratory: diminished throughout without wheezes rhonchi or rales   abdomen: Soft nontender nondistended Extremities: Trace  edema  Discharge Instructions   Discharge Instructions    Diet - low sodium heart healthy    Complete by:  As directed      Walk with assistance    Complete by:  As directed           Current Discharge Medication List    START taking these medications   Details  fosfomycin (MONUROL) 3 G PACK Take 3 g by mouth once. Qty: 1 g      CONTINUE these medications which have CHANGED   Details  cycloSPORINE modified (NEORAL) 25 MG capsule Take 2 capsules (50 mg total) by mouth 2 (two) times daily.    furosemide (LASIX) 40 MG tablet Take 2 tablets (80 mg total) by mouth 2 (two) times daily. 80mg  in the morning and 40mg  in the evening Qty: 30 tablet    warfarin (COUMADIN) 5 MG tablet Alternate 2.5 mg and 5 mg daily. Qty: 30 tablet, Refills: 0      CONTINUE these medications which have NOT CHANGED   Details  albuterol (PROVENTIL HFA;VENTOLIN HFA) 108 (90 BASE) MCG/ACT inhaler Inhale 2 puffs into the lungs every 6 (six) hours as needed for wheezing or shortness of breath. Patient uses this medication prn.    amLODipine (NORVASC) 5 MG tablet Take 5 mg by mouth daily.    azaTHIOprine (IMURAN) 50 MG tablet Take 50 mg by mouth 2 (two) times daily.     budesonide-formoterol (SYMBICORT) 160-4.5 MCG/ACT inhaler Inhale 2 puffs into the lungs daily.     cetirizine (ZYRTEC) 10 MG tablet Take 10 mg by mouth every morning.     Cholecalciferol (VITAMIN D) 2000 UNITS tablet Take 2,000 Units by mouth every evening.     cloNIDine (CATAPRES) 0.1 MG tablet Take 0.1 mg by mouth 2 (two) times daily.    Coenzyme Q10 (CO Q 10) 100 MG CAPS Take 1 tablet by mouth every evening.     colesevelam (WELCHOL) 625 MG tablet Take 1,875 mg by mouth 2 (two) times daily with a meal.    ENABLEX 7.5 MG 24 hr tablet Take 7.5 mg by mouth every morning.     fenofibrate (TRICOR) 48 MG tablet Take 48 mg by mouth daily.    FLUoxetine (PROZAC) 20 MG tablet Take 20 mg by mouth daily.    fluticasone (FLONASE) 50  MCG/ACT nasal spray Place 1 spray into both nostrils daily as needed for allergies or rhinitis.    hyoscyamine (ANASPAZ) 0.125 MG TBDP Place 0.125 mg under the tongue every 6 (six) hours as needed for bladder spasms. Patient uses this medication prn.    L-Lysine 500 MG TABS Take 500 mg by mouth daily.    levothyroxine (SYNTHROID, LEVOTHROID) 75 MCG tablet Take 75 mcg by mouth daily.    metoprolol succinate (TOPROL-XL) 100 MG 24 hr tablet Take 100 mg by mouth daily. Take with or immediately following a meal.    mirabegron ER (MYRBETRIQ) 25 MG TB24 tablet Take 25 mg by mouth every evening.     Multiple Vitamin (MULTIVITAMIN) tablet Take 1 tablet by mouth daily.    omega-3 fish oil (MAXEPA) 1000 MG CAPS capsule Take 1 capsule by mouth daily.    omeprazole (PRILOSEC) 20 MG capsule Take 20 mg by mouth daily.     ondansetron (ZOFRAN) 4 MG tablet Take 4  mg by mouth every 8 (eight) hours as needed for nausea or vomiting.    oxybutynin (DITROPAN-XL) 10 MG 24 hr tablet Take 10 mg by mouth at bedtime.    POTASSIUM CHLORIDE PO Take 300 mcg by mouth every evening.     predniSONE (DELTASONE) 5 MG tablet Take 5 mg by mouth daily with breakfast.    Probiotic Product (ALIGN) 4 MG CAPS Take 1 tablet by mouth daily.    Resveratrol 100 MG CAPS Take 1 capsule by mouth daily.    sulfamethoxazole-trimethoprim (BACTRIM,SEPTRA) 400-80 MG per tablet Take 1 tablet by mouth 3 (three) times a week. Monday, Wednesday, and Friday.       Allergies  Allergen Reactions  . Aricept [Donepezil Hcl] Nausea And Vomiting  . Codeine Nausea And Vomiting  . Penicillins Other (See Comments)    Doesn't remember  . Statins Other (See Comments)    Doesn't remember  . Streptomycin Other (See Comments)    unknown  . Tetracyclines & Related Rash      The results of significant diagnostics from this hospitalization (including imaging, microbiology, ancillary and laboratory) are listed below for reference.     Significant Diagnostic Studies: Dg Chest Port 1 View  05/26/2015   CLINICAL DATA:  Short of breath for 1 day.  EXAM: PORTABLE CHEST - 1 VIEW  COMPARISON:  05/25/2015.  FINDINGS: Mild enlargement of the cardiopericardial silhouette. No mediastinal or hilar masses.  Vascular congestion has improved as has interstitial thickening. Hazy opacity at the lung base on the right has improved. There is persistent left lung base opacity likely combination of a small effusion and atelectasis. Pneumonia is not excluded.  Stable scarring at the right apex.  No pneumothorax.  IMPRESSION: 1. Improvement in lung aeration since the prior study. Findings are most consistent with improved congestive heart failure.   Electronically Signed   By: [name] M.D.   On: 05/26/2015 19:07   Dg Chest Port 1 View  05/25/2015   CLINICAL DATA:  SOB (shortness of breath) R06.02 (ICD-10-CM) followup exam.  EXAM: PORTABLE CHEST - 1 VIEW  COMPARISON:  05/22/2015  FINDINGS: Since prior study, vascular congestion has developed with mild interstitial thickening most evident centrally and in the lung bases. Hazy opacity at both lung bases is also noted consistent with small effusions.  Mild enlargement of the cardiopericardial silhouette is stable. No mediastinal or hilar masses. No convincing adenopathy. No pneumothorax.  IMPRESSION: 1. Vascular congestion with mild central and lung base interstitial thickening and small pleural effusion, developing since prior exam. Findings consistent with mild congestive heart failure.   Electronically Signed   By: [name] M.D.   On: 05/25/2015 11:45   Dg Chest Portable 1 View  05/22/2015   CLINICAL DATA:  Sepsis  EXAM: PORTABLE CHEST - 1 VIEW  COMPARISON:  03/01/2014  FINDINGS: A single AP portable view of the chest demonstrates no focal airspace consolidation or alveolar edema. The lungs are grossly clear except for calcified granulomatous changes. There is no large effusion or pneumothorax.  Cardiac and mediastinal contours appear unremarkable.  IMPRESSION: No active disease.   Electronically Signed   By: Andreas Newport M.D.   On: 05/22/2015 01:23   Echo Left ventricle: The cavity size was normal. Wall thickness was normal. Systolic function was normal. The estimated ejection fraction was in the range of 60% to 65%. Wall motion was normal; there were no regional wall motion abnormalities. Features are consistent with a pseudonormal left ventricular filling  pattern, with concomitant abnormal relaxation and increased filling pressure (grade 2 diastolic dysfunction). - Mitral valve: Calcified annulus. There was mild regurgitation. - Left atrium: The atrium was mildly dilated. - Pulmonary arteries: Systolic pressure was mildly increased. PA peak pressure: 34 mm Hg (S).  Microbiology: Recent Results (from the past 240 hour(s))  Culture, blood (routine x 2)     Status: None (Preliminary result)   Collection Time: 05/22/15  1:00 AM  Result Value Ref Range Status   Specimen Description BLOOD RIGHT FOREARM  Final   Special Requests BOTTLES DRAWN AEROBIC AND ANAEROBIC 5CC  Final   Culture NO GROWTH 4 DAYS  Final   Report Status PENDING  Incomplete  Urine culture     Status: None   Collection Time: 05/22/15  1:14 AM  Result Value Ref Range Status   Specimen Description URINE, CATHETERIZED  Final   Special Requests NONE  Final   Culture   Final    >=100,000 COLONIES/mL ESCHERICHIA COLI Confirmed Extended Spectrum Beta-Lactamase Producer (ESBL)    Report Status 05/24/2015 FINAL  Final   Organism ID, Bacteria ESCHERICHIA COLI  Final      Susceptibility   Escherichia coli - MIC*    AMPICILLIN >=32 RESISTANT Resistant     CEFAZOLIN >=64 RESISTANT Resistant     CEFTRIAXONE >=64 RESISTANT Resistant     CIPROFLOXACIN >=4 RESISTANT Resistant     GENTAMICIN <=1 SENSITIVE Sensitive     IMIPENEM <=0.25 SENSITIVE Sensitive     NITROFURANTOIN <=16 SENSITIVE Sensitive      TRIMETH/SULFA >=320 RESISTANT Resistant     AMPICILLIN/SULBACTAM >=32 RESISTANT Resistant     PIP/TAZO <=4 SENSITIVE Sensitive     * >=100,000 COLONIES/mL ESCHERICHIA COLI  Culture, blood (routine x 2)     Status: None (Preliminary result)   Collection Time: 05/22/15  1:16 AM  Result Value Ref Range Status   Specimen Description BLOOD RIGHT ANTECUBITAL  Final   Special Requests BOTTLES DRAWN AEROBIC AND ANAEROBIC 10CC  Final   Culture NO GROWTH 4 DAYS  Final   Report Status PENDING  Incomplete  MRSA PCR Screening     Status: None   Collection Time: 05/22/15  5:16 AM  Result Value Ref Range Status   MRSA by PCR NEGATIVE NEGATIVE Final    Comment:        The GeneXpert MRSA Assay (FDA approved for NASAL specimens only), is one component of a comprehensive MRSA colonization surveillance program. It is not intended to diagnose MRSA infection nor to guide or monitor treatment for MRSA infections.      Labs: Basic Metabolic Panel:  Recent Labs Lab 05/22/15 0100 05/22/15 0350 05/23/15 0545 05/25/15 0701 05/27/15 0622  NA 141 142 143 143 140  K 3.4* 3.1* 3.4* 4.2 3.5  CL 108 114* 114* 114* 106  CO2 19* 18* 19* 21* 26  GLUCOSE 144* 144* 109* 98 99  BUN 23* 19 13 10 13   CREATININE 1.88* 1.66* 1.47* 1.26* 1.22*  CALCIUM 9.4 8.3* 8.5* 9.1 9.1  MG  --   --  1.6*  --   --    Liver Function Tests:  Recent Labs Lab 05/22/15 0100 05/23/15 0545  AST 36 29  ALT 15 17  ALKPHOS 51 40  BILITOT 0.7 0.8  PROT 6.0* 5.4*  ALBUMIN 3.2* 2.6*   No results for input(s): LIPASE, AMYLASE in the last 168 hours. No results for input(s): AMMONIA in the last 168 hours. CBC:  Recent Labs Lab 05/22/15  0100 05/22/15 0350 05/23/15 0545 05/25/15 0701 05/27/15 0622  WBC 11.6* 10.8* 7.4 6.3 5.6  NEUTROABS 9.0*  --  5.7  --   --   HGB 12.4 11.4* 10.6* 10.9* 11.0*  HCT 37.3 35.5* 33.4* 34.5* 34.0*  MCV 92.6 93.2 94.9 96.6 93.4  PLT 192 171 162 151 177   Cardiac Enzymes: No  results for input(s): CKTOTAL, CKMB, CKMBINDEX, TROPONINI in the last 168 hours. BNP: BNP (last 3 results) No results for input(s): BNP in the last 8760 hours.  ProBNP (last 3 results) No results for input(s): PROBNP in the last 8760 hours.  CBG: No results for input(s): GLUCAP in the last 168 hours.     SignedDelfina Redwood  [facility] 05/27/2015, 8:46 AM

## 2015-05-27 NOTE — Assessment & Plan Note (Signed)
Escherichia coli UTI, ESBL; Started on broad-spectrum antibiotics. Blood cultures remained negative. Urine culture grew Escherichia coli, ESBL. Ertapenem continued for a total of about 6 days  Ffosfomycin, given 1 dose today prior to d/c  SNF plan; last fosfomycin dose in  72 hours.

## 2015-05-27 NOTE — Assessment & Plan Note (Signed)
SNF plan -Continue Symbicort 160-4.5 BID -DuoNeb TID -Ambulatory SPO2

## 2015-05-27 NOTE — Progress Notes (Signed)
Upon arrival to patient room, patient did not have nasal cannula in nose and patient sats were 85%.  Gave scheduled neb and placed patient on 4L nasal cannula with improvement in sats to 95%.  Will continue to monitor.

## 2015-06-17 ENCOUNTER — Non-Acute Institutional Stay (SKILLED_NURSING_FACILITY): Payer: Medicare Other | Admitting: Internal Medicine

## 2015-06-17 ENCOUNTER — Encounter: Payer: Self-pay | Admitting: Internal Medicine

## 2015-06-17 DIAGNOSIS — B9620 Unspecified Escherichia coli [E. coli] as the cause of diseases classified elsewhere: Secondary | ICD-10-CM | POA: Diagnosis not present

## 2015-06-17 DIAGNOSIS — J439 Emphysema, unspecified: Secondary | ICD-10-CM

## 2015-06-17 DIAGNOSIS — I824Y9 Acute embolism and thrombosis of unspecified deep veins of unspecified proximal lower extremity: Secondary | ICD-10-CM | POA: Diagnosis not present

## 2015-06-17 DIAGNOSIS — N390 Urinary tract infection, site not specified: Secondary | ICD-10-CM

## 2015-06-17 DIAGNOSIS — E038 Other specified hypothyroidism: Secondary | ICD-10-CM

## 2015-06-17 DIAGNOSIS — I5031 Acute diastolic (congestive) heart failure: Secondary | ICD-10-CM | POA: Diagnosis not present

## 2015-06-17 DIAGNOSIS — Z940 Kidney transplant status: Secondary | ICD-10-CM

## 2015-06-17 DIAGNOSIS — E034 Atrophy of thyroid (acquired): Secondary | ICD-10-CM

## 2015-06-17 DIAGNOSIS — R32 Unspecified urinary incontinence: Secondary | ICD-10-CM | POA: Diagnosis not present

## 2015-06-17 DIAGNOSIS — A419 Sepsis, unspecified organism: Secondary | ICD-10-CM

## 2015-06-17 DIAGNOSIS — I10 Essential (primary) hypertension: Secondary | ICD-10-CM

## 2015-06-17 DIAGNOSIS — N183 Chronic kidney disease, stage 3 unspecified: Secondary | ICD-10-CM

## 2015-06-17 NOTE — Progress Notes (Signed)
MRN: [id number] Name: Patricia [name]  Sex: female Age: 81 y.o. DOB: [birth year]  [place] #: [facility] Facility/Room:100 Level Of Care: SNF Provider: Inocencio Homes D Emergency Contacts: Extended Emergency Contact Information Primary Emergency Contact: Herdt,Howard Address: [address] [place]te Litter of [place] Phone: [phone] Mobile Phone: [phone] Relation: Spouse Secondary Emergency Contact: Franki Cabot, [place] of [place] Mobile Phone: [phone] Relation: Son  Code Status:   Allergies: Macrolides and ketolides; Aricept; Codeine; Penicillins; Statins; Streptomycin; and Tetracyclines & related  Chief Complaint  Patient presents with  . Discharge Note    HPI: Patient is 81 y.o. female whoDepression, Anxiety, COPD, HTN, CAD in native artery, Hypothyroidism Rt renal transplant, DVT last year, frequent urinary tract infections who was admitted to hospital from 8/7-12 for urosepsis 2/2 E Coli, ESBL, complicated by AKI and acute resp failure from acute on chronic diastolic CHF.  Past Medical History  Diagnosis Date  . Renal disorder   . Hx of kidney transplant     right  . Coronary artery disease   . Hypertension   . Thyroid disease   . Multiple allergies   . Abnormal gait   . Reflux   . Diverticulitis   . Hypothyroidism   . Depression   . Anxiety   . COPD, mild   . Dizziness   . Diabetes   . DVT (deep venous thrombosis)     Past Surgical History  Procedure Laterality Date  . Nephrectomy transplanted organ    . Tumor removal      Brain       Medication List       This list is accurate as of: 06/17/15  5:30 PM.  Always use your most recent med list.               albuterol 108 (90 BASE) MCG/ACT inhaler  Commonly known as:  PROVENTIL HFA;VENTOLIN HFA  Inhale 2 puffs into the lungs every 6 (six) hours as needed for wheezing or shortness of breath. Patient uses this medication prn.     ALIGN 4 MG Caps  Take 1 tablet by mouth daily.     amLODipine 5 MG tablet  Commonly known as:  NORVASC  Take 5 mg by mouth daily.     azaTHIOprine 50 MG tablet  Commonly known as:  IMURAN  Take 50 mg by mouth 2 (two) times daily.     budesonide-formoterol 160-4.5 MCG/ACT inhaler  Commonly known as:  SYMBICORT  Inhale 2 puffs into the lungs daily.     cetirizine 10 MG tablet  Commonly known as:  ZYRTEC  Take 10 mg by mouth every morning.     cloNIDine 0.1 MG tablet  Commonly known as:  CATAPRES  Take 0.1 mg by mouth 2 (two) times daily.     Co Q 10 100 MG Caps  Take 1 tablet by mouth every evening.     colesevelam 625 MG tablet  Commonly known as:  WELCHOL  Take 1,875 mg by mouth 2 (two) times daily with a meal.     cycloSPORINE modified 25 MG capsule  Commonly known as:  NEORAL  Take 2 capsules (50 mg total) by mouth 2 (two) times daily.     ENABLEX 7.5 MG 24 hr tablet  Generic drug:  darifenacin  Take 7.5 mg by mouth every morning.     fenofibrate  48 MG tablet  Commonly known as:  TRICOR  Take 48 mg by mouth daily.     FLUoxetine 20 MG tablet  Commonly known as:  PROZAC  Take 20 mg by mouth daily.     fluticasone 50 MCG/ACT nasal spray  Commonly known as:  FLONASE  Place 1 spray into both nostrils daily as needed for allergies or rhinitis.     fosfomycin 3 G Pack  Commonly known as:  MONUROL  Take 3 g by mouth once.     furosemide 40 MG tablet  Commonly known as:  LASIX  Take 2 tablets (80 mg total) by mouth 2 (two) times daily. 80mg  in the morning and 40mg  in the evening     hyoscyamine 0.125 MG Tbdp disintergrating tablet  Commonly known as:  ANASPAZ  Place 0.125 mg under the tongue every 6 (six) hours as needed for bladder spasms. Patient uses this medication prn.     L-Lysine 500 MG Tabs  Take 500 mg by mouth daily.     levothyroxine 75 MCG tablet  Commonly known as:  SYNTHROID, LEVOTHROID  Take 75 mcg by mouth daily.     metoprolol succinate  100 MG 24 hr tablet  Commonly known as:  TOPROL-XL  Take 100 mg by mouth daily. Take with or immediately following a meal.     multivitamin tablet  Take 1 tablet by mouth daily.     MYRBETRIQ 25 MG Tb24 tablet  Generic drug:  mirabegron ER  Take 25 mg by mouth every evening.     omega-3 fish oil 1000 MG Caps capsule  Commonly known as:  MAXEPA  Take 1 capsule by mouth daily.     omeprazole 20 MG capsule  Commonly known as:  PRILOSEC  Take 20 mg by mouth daily.     ondansetron 4 MG tablet  Commonly known as:  ZOFRAN  Take 4 mg by mouth every 8 (eight) hours as needed for nausea or vomiting.     oxybutynin 10 MG 24 hr tablet  Commonly known as:  DITROPAN-XL  Take 10 mg by mouth at bedtime.     POTASSIUM CHLORIDE PO  Take 300 mcg by mouth every evening.     predniSONE 5 MG tablet  Commonly known as:  DELTASONE  Take 5 mg by mouth daily with breakfast.     Resveratrol 100 MG Caps  Take 1 capsule by mouth daily.     sulfamethoxazole-trimethoprim 400-80 MG per tablet  Commonly known as:  BACTRIM,SEPTRA  Take 1 tablet by mouth 3 (three) times a week. Monday, Wednesday, and Friday.     Vitamin D 2000 UNITS tablet  Take 2,000 Units by mouth every evening.     warfarin 5 MG tablet  Commonly known as:  COUMADIN  Alternate 2.5 mg and 5 mg daily.        No orders of the defined types were placed in this encounter.     There is no immunization history on file for this patient.  Social History  Substance Use Topics  . Smoking status: Never Smoker   . Smokeless tobacco: Never Used  . Alcohol Use: No    Filed Vitals:   06/17/15 1717  BP: 123/64  Pulse: 61  Temp: 96.7 F (35.9 C)  Resp: 20    Physical Exam  GENERAL APPEARANCE: Alert, conversant. No acute distress.  HEENT: Unremarkable. RESPIRATORY: Breathing is even, unlabored. Lung sounds are clear   CARDIOVASCULAR: Heart RRR no murmurs, rubs  or gallops. No peripheral edema.  GASTROINTESTINAL: Abdomen is  soft, non-tender, not distended w/ normal bowel sounds.  NEUROLOGIC: Cranial nerves 2-12 grossly intact. Moves all extremities  Patient Active Problem List   Diagnosis Date Noted  . Acute diastolic CHF (congestive heart failure) 05/27/2015  . Urinary incontinence 05/27/2015  . Escherichia coli urinary tract infection   . Other emphysema   . Hypokalemia   . Hypomagnesemia   . Sepsis secondary to UTI 05/22/2015  . DVT, lower extremity, proximal 03/02/2014  . CKD (chronic kidney disease) stage 3, GFR 30-59 ml/min 03/02/2014  . Unspecified essential hypertension 03/02/2014  . DVT (deep venous thrombosis) 03/01/2014  . Acute renal failure superimposed on stage 3 chronic kidney disease 03/01/2014  . Gastroenteritis 11/22/2013  . Nausea & vomiting 11/22/2013  . Hypercalcemia 11/22/2013  . Infection due to ESBL-producing Escherichia coli 10/07/2013  . Septicemia due to E. coli 10/07/2013  . Urinary tract infection 10/05/2013  . Sepsis 10/05/2013  . History of renal transplant   . Hx of kidney transplant   . Hypertension   . Thyroid disease   . Multiple allergies   . Abnormal gait   . Reflux   . Diverticulitis   . Hypothyroidism   . Depression   . Anxiety   . COPD (chronic obstructive pulmonary disease)   . Dizziness   . Diabetes     CBC    Component Value Date/Time   WBC 5.6 05/27/2015 0622   RBC 3.64* 05/27/2015 0622   HGB 11.0* 05/27/2015 0622   HCT 34.0* 05/27/2015 0622   PLT 177 05/27/2015 0622   MCV 93.4 05/27/2015 0622   LYMPHSABS 1.0 05/23/2015 0545   MONOABS 0.5 05/23/2015 0545   EOSABS 0.1 05/23/2015 0545   BASOSABS 0.0 05/23/2015 0545    CMP     Component Value Date/Time   NA 140 05/27/2015 0622   K 3.5 05/27/2015 0622   CL 106 05/27/2015 0622   CO2 26 05/27/2015 0622   GLUCOSE 99 05/27/2015 0622   BUN 13 05/27/2015 0622   CREATININE 1.22* 05/27/2015 0622   CALCIUM 9.1 05/27/2015 0622   PROT 5.4* 05/23/2015 0545   ALBUMIN 2.6* 05/23/2015 0545    AST 29 05/23/2015 0545   ALT 17 05/23/2015 0545   ALKPHOS 40 05/23/2015 0545   BILITOT 0.8 05/23/2015 0545   GFRNONAA 40* 05/27/2015 0622   GFRAA 47* 05/27/2015 0622    Assessment and Plan  Pt is stable to be discharged to home with HH/OT/PT, Prescriptions have been written. Just before d/c pt had redness and heat LLE, which was txed with 2 days of Bactrim BID,and pt d/c back on her usual regimen of bactrim 3 times a week.   Time spent > 30 min; > 50% of time with patient was spent reviewing records, labs, tests and studies, counseling and developing plan of care  Hennie Duos, MD

## 2015-06-21 ENCOUNTER — Encounter: Payer: Self-pay | Admitting: Internal Medicine

## 2015-06-21 ENCOUNTER — Non-Acute Institutional Stay (SKILLED_NURSING_FACILITY): Payer: Medicare Other | Admitting: Internal Medicine

## 2015-06-21 DIAGNOSIS — R609 Edema, unspecified: Secondary | ICD-10-CM

## 2015-06-21 DIAGNOSIS — I5031 Acute diastolic (congestive) heart failure: Secondary | ICD-10-CM

## 2015-06-21 DIAGNOSIS — N179 Acute kidney failure, unspecified: Secondary | ICD-10-CM | POA: Diagnosis not present

## 2015-06-21 DIAGNOSIS — L03116 Cellulitis of left lower limb: Secondary | ICD-10-CM

## 2015-06-21 DIAGNOSIS — N183 Chronic kidney disease, stage 3 (moderate): Secondary | ICD-10-CM

## 2015-06-21 NOTE — Progress Notes (Signed)
Patient ID: Patricia [name], female   DOB: 05-Aug-1932, 81 y.o.   MRN: [id number] MRN: [id number] Name: Patricia [name]  Sex: female Age: 81 y.o. DOB: [birth year]  [place] #: [facility] Facility/Room:100 Level Of Care: SNF Provider: Wille Celeste Emergency Contacts: Extended Emergency Contact Information Primary Emergency Contact: Wieser,Howard Address: [address]          [place] Johnnette Litter of [place] Phone: [phone] Mobile Phone: [phone] Relation: Spouse Secondary Emergency Contact: Franki Cabot, [place] of [place] Mobile Phone: [phone] Relation: Son  Code Status:   Allergies: Macrolides and ketolides; Aricept; Codeine; Penicillins; Statins; Streptomycin; and Tetracyclines & related  Chief Complaint  Patient presents with  . Acute Visit    HPI: Patient is 81 y.o. female whoDepression, Anxiety, COPD, HTN, CAD in native artery, Hypothyroidism Rt renal transplant, DVT last year, frequent urinary tract infections who was admitted to hospital from 8/7-12 for urosepsis 2/2 E Coli, ESBL, complicated by AKI and acute resp failure from acute on chronic diastolic CHF. Her stay here is been relatively stable actually is slated for discharge and seen by Dr. Sheppard Coil recently for this.  Yesterday nursing was concerned that possibly she had some increased edema erythema of her left leg-she had been on Bactrim 3 times a week empirically for I believe urinary infection recurrent.  This was increased to twice a day dosing yesterday secondary to concerns of cellulitis in the leg.  She also has a venous Doppler pending.  This is complicated with a history of DVT right leg she is on chronic Coumadin we will need to check an INR tomorrow before discharge-currently INR is 1.8 and trending up  Another issue is renal insufficiency her creatinine most recent lab is up to 2.1 previous one on August 15 was 1.3 she is on Lasix80 mg twice a  day-   Currently patient is resting in bed comfortably she has no acute complaints           Past Medical History  Diagnosis Date  . Renal disorder   . Hx of kidney transplant     right  . Coronary artery disease   . Hypertension   . Thyroid disease   . Multiple allergies   . Abnormal gait   . Reflux   . Diverticulitis   . Hypothyroidism   . Depression   . Anxiety   . COPD, mild   . Dizziness   . Diabetes   . DVT (deep venous thrombosis)     Past Surgical History  Procedure Laterality Date  . Nephrectomy transplanted organ    . Tumor removal      Brain       Medication List       This list is accurate as of: 06/21/15 11:59 PM.  Always use your most recent med list.               albuterol 108 (90 BASE) MCG/ACT inhaler  Commonly known as:  PROVENTIL HFA;VENTOLIN HFA  Inhale 2 puffs into the lungs every 6 (six) hours as needed for wheezing or shortness of breath. Patient uses this medication prn.     ALIGN 4 MG Caps  Take 1 tablet by mouth daily.     amLODipine 5 MG tablet  Commonly known as:  NORVASC  Take 5 mg by mouth daily.     azaTHIOprine 50 MG tablet  Commonly known as:  [employer]  Take 50 mg by mouth 2 (two) times daily.     budesonide-formoterol 160-4.5 MCG/ACT inhaler  Commonly known as:  SYMBICORT  Inhale 2 puffs into the lungs daily.     cetirizine 10 MG tablet  Commonly known as:  ZYRTEC  Take 10 mg by mouth every morning.     cloNIDine 0.1 MG tablet  Commonly known as:  CATAPRES  Take 0.1 mg by mouth 2 (two) times daily.     Co Q 10 100 MG Caps  Take 1 tablet by mouth every evening.     colesevelam 625 MG tablet  Commonly known as:  WELCHOL  Take 1,875 mg by mouth 2 (two) times daily with a meal.     cycloSPORINE modified 25 MG capsule  Commonly known as:  NEORAL  Take 2 capsules (50 mg total) by mouth 2 (two) times daily.     ENABLEX 7.5 MG 24 hr tablet  Generic drug:  darifenacin  Take 7.5 mg by mouth every morning.      fenofibrate 48 MG tablet  Commonly known as:  TRICOR  Take 48 mg by mouth daily.     FLUoxetine 20 MG tablet  Commonly known as:  PROZAC  Take 20 mg by mouth daily.     fluticasone 50 MCG/ACT nasal spray  Commonly known as:  FLONASE  Place 1 spray into both nostrils daily as needed for allergies or rhinitis.     furosemide 40 MG tablet  Commonly known as:  LASIX  Take 2 tablets (80 mg total) by mouth 2 (two) times daily. 80mg  in the morning and 40mg  in the evening     hyoscyamine 0.125 MG Tbdp disintergrating tablet  Commonly known as:  ANASPAZ  Place 0.125 mg under the tongue every 6 (six) hours as needed for bladder spasms. Patient uses this medication prn.     L-Lysine 500 MG Tabs  Take 500 mg by mouth daily.     levothyroxine 75 MCG tablet  Commonly known as:  SYNTHROID, LEVOTHROID  Take 75 mcg by mouth daily.     metoprolol succinate 100 MG 24 hr tablet  Commonly known as:  TOPROL-XL  Take 100 mg by mouth daily. Take with or immediately following a meal.     multivitamin tablet  Take 1 tablet by mouth daily.     MYRBETRIQ 25 MG Tb24 tablet  Generic drug:  mirabegron ER  Take 25 mg by mouth every evening.     omega-3 fish oil 1000 MG Caps capsule  Commonly known as:  MAXEPA  Take 1 capsule by mouth daily.     omeprazole 20 MG capsule  Commonly known as:  PRILOSEC  Take 20 mg by mouth daily.     ondansetron 4 MG tablet  Commonly known as:  ZOFRAN  Take 4 mg by mouth every 8 (eight) hours as needed for nausea or vomiting.     oxybutynin 10 MG 24 hr tablet  Commonly known as:  DITROPAN-XL  Take 10 mg by mouth at bedtime.     potassium chloride SA 20 MEQ tablet  Commonly known as:  K-DUR,KLOR-CON  Take 40 mEq by mouth daily.     predniSONE 5 MG tablet  Commonly known as:  DELTASONE  Take 5 mg by mouth daily with breakfast.     Resveratrol 100 MG Caps  Take 1 capsule by mouth daily.     sulfamethoxazole-trimethoprim 400-80 MG per tablet  Commonly  known as:  BACTRIM,SEPTRA  Take  1 tablet by mouth 3 (three) times a week. Monday, Wednesday, and Friday.     Vitamin D 2000 UNITS tablet  Take 2,000 Units by mouth every evening.     warfarin 3 MG tablet  Commonly known as:  COUMADIN  Take 3 mg by mouth daily. Tues,Thurs,Sat,Sun     warfarin 5 MG tablet  Commonly known as:  COUMADIN  Alternate 2.5 mg and 5 mg daily.        No orders of the defined types were placed in this encounter.     There is no immunization history on file for this patient.  Social History  Substance Use Topics  . Smoking status: Never Smoker   . Smokeless tobacco: Never Used  . Alcohol Use: No      Review of systems.  General no complaints of fever or chills.  Skin does not complain of rashes or itching again questionable left leg erythema as noted above.  Head ears eyes nose mouth and throat does not complain of any sore throat or visual changes.  Respiratory does not complain of cough or shortness of breath.  Cardiac no chest pain as some chronic lower extremity edema bit more in the left versus the right.  GI does not complain of nausea vomiting diarrhea constipation or abdominal discomfort.  GU is not complaining of dysuria currently.  Musculoskeletal does not complain of joint pain or increased leg pain from baseline.  Neurologic is not complaining of dizziness headache or syncopal-type feelings or numbness.  Psych does not complaining currently of depression or anxiety  Filed Vitals:   06/21/15 1429  BP: 122/73  Pulse: 60  Temp: 97.2 F (36.2 C)  Resp: 18    Physical Exam  GENERAL APPEARANCE: Alert, conversant. No acute distress. Her skin is warm and dry-per review of left leg does have some flaking and some very pale erythema although this does not really appear very remarkable area is not significantly warm or tender  HEENT: Unremarkable.oro Pharynx is clear mucous membranes moist Eyes pupils appear reactive to light  rolling are clear visual acuity appears grossly intact RESPIRATORY: Breathing is even, unlabored. Lung sounds are clear   CARDIOVASCULAR: Heart RRR no murmurs, rubs or gallops. Appears to have some mild baseline edema of her right leg somewhat increased on the left leg although again this edema is not increasingly warm or tender-pedal pulses are intact bilaterally  GASTROINTESTINAL: Abdomen is soft, non-tender, not distended w/ normal bowel sounds Muscle skeletal does move all extremities 4 I do not note any deformities.  NEUROLOGIC: Cranial nerves 2-12 grossly intact. Moves all extremities Psych-she appears alert and oriented pleasant and appropriate  Patient Active Problem List   Diagnosis Date Noted  . Acute diastolic CHF (congestive heart failure) 05/27/2015  . Urinary incontinence 05/27/2015  . Escherichia coli urinary tract infection   . Other emphysema   . Hypokalemia   . Hypomagnesemia   . Sepsis secondary to UTI 05/22/2015  . DVT, lower extremity, proximal 03/02/2014  . CKD (chronic kidney disease) stage 3, GFR 30-59 ml/min 03/02/2014  . Unspecified essential hypertension 03/02/2014  . DVT (deep venous thrombosis) 03/01/2014  . Acute renal failure superimposed on stage 3 chronic kidney disease 03/01/2014  . Gastroenteritis 11/22/2013  . Nausea & vomiting 11/22/2013  . Hypercalcemia 11/22/2013  . Infection due to ESBL-producing Escherichia coli 10/07/2013  . Septicemia due to E. coli 10/07/2013  . Urinary tract infection 10/05/2013  . Sepsis 10/05/2013  . History of renal transplant   .  Hx of kidney transplant   . Hypertension   . Thyroid disease   . Multiple allergies   . Abnormal gait   . Reflux   . Diverticulitis   . Hypothyroidism   . Depression   . Anxiety   . COPD (chronic obstructive pulmonary disease)   . Dizziness   . Diabetes      Labs.  06/21/2015.  Sodium 139 potassium 4 BUN 39 creatinine 2.1.  05/30/2015.  Sodium 140 potassium 4.2 BUN 17  creatinine 1.3.  05/27/2015.  WBC 5.6 hemoglobin 11.0 platelets 177.   CBC    Component Value Date/Time   WBC 5.6 05/27/2015 0622   RBC 3.64* 05/27/2015 0622   HGB 11.0* 05/27/2015 0622   HCT 34.0* 05/27/2015 0622   PLT 177 05/27/2015 0622   MCV 93.4 05/27/2015 0622   LYMPHSABS 1.0 05/23/2015 0545   MONOABS 0.5 05/23/2015 0545   EOSABS 0.1 05/23/2015 0545   BASOSABS 0.0 05/23/2015 0545    CMP     Component Value Date/Time   NA 140 05/27/2015 0622   K 3.5 05/27/2015 0622   CL 106 05/27/2015 0622   CO2 26 05/27/2015 0622   GLUCOSE 99 05/27/2015 0622   BUN 13 05/27/2015 0622   CREATININE 1.22* 05/27/2015 0622   CALCIUM 9.1 05/27/2015 0622   PROT 5.4* 05/23/2015 0545   ALBUMIN 2.6* 05/23/2015 0545   AST 29 05/23/2015 0545   ALT 17 05/23/2015 0545   ALKPHOS 40 05/23/2015 0545   BILITOT 0.8 05/23/2015 0545   GFRNONAA 40* 05/27/2015 0622   GFRAA 47* 05/27/2015 0622    Assessment and Plan  #1-left leg edema erythema-on exam was not all that remarkable the erythema is quite pale does not really appear to be cellulitic leg is not increasingly tender or warm-will reduce her Bactrim back to previous dosing of 3 times a week and monitor overnight before discharge.  In regards to edema venous Doppler is pending I suspect this will be negative but really would like clarification on this before she goes home.  #2-renal insufficiency creatinine has gone up-she is on a large dose of Lasix this was discussed with Dr. Sheppard Coil via phone will decrease Lasix to 2 AM dosing 80 mg and recheck a basic metabolic panel on September 8 clinically she appears to be stable  #3-history of anemia-suspect there is an element of chronic disease Will update this as on September 8.  #4-anticoagulation management with history of right leg DVT INR is pending for tomorrow this is trending up at 1.8 --continues on Coumadin  [id number]     Carleena Mires C,

## 2015-06-27 DIAGNOSIS — M25552 Pain in left hip: Secondary | ICD-10-CM | POA: Diagnosis not present

## 2015-06-27 DIAGNOSIS — E1122 Type 2 diabetes mellitus with diabetic chronic kidney disease: Secondary | ICD-10-CM | POA: Diagnosis not present

## 2015-06-27 DIAGNOSIS — I5032 Chronic diastolic (congestive) heart failure: Secondary | ICD-10-CM | POA: Diagnosis not present

## 2015-06-27 DIAGNOSIS — M6281 Muscle weakness (generalized): Secondary | ICD-10-CM | POA: Diagnosis not present

## 2015-06-27 DIAGNOSIS — R2689 Other abnormalities of gait and mobility: Secondary | ICD-10-CM | POA: Diagnosis not present

## 2015-06-27 DIAGNOSIS — I129 Hypertensive chronic kidney disease with stage 1 through stage 4 chronic kidney disease, or unspecified chronic kidney disease: Secondary | ICD-10-CM | POA: Diagnosis not present

## 2015-08-09 ENCOUNTER — Ambulatory Visit: Payer: Medicare Other

## 2015-08-12 ENCOUNTER — Telehealth: Payer: Self-pay | Admitting: Pharmacist Clinician (PhC)/ Clinical Pharmacy Specialist

## 2015-08-12 NOTE — Telephone Encounter (Signed)
Returned call to patient's husband.He stated he called this office by mistake.Wife is not our patient.

## 2015-08-12 NOTE — Telephone Encounter (Signed)
Would Like to know if Patricia [name] need to go off of her coumadin for an injection that she is having . Please call   Thanks

## 2015-08-16 ENCOUNTER — Ambulatory Visit: Payer: Medicare Other | Admitting: Pharmacist Clinician (PhC)/ Clinical Pharmacy Specialist

## 2015-08-16 LAB — POCT INR: INR: 1.1

## 2015-08-17 ENCOUNTER — Ambulatory Visit: Payer: Medicare Other | Admitting: Pharmacist Clinician (PhC)/ Clinical Pharmacy Specialist

## 2015-08-20 ENCOUNTER — Encounter (HOSPITAL_COMMUNITY): Payer: Self-pay

## 2015-08-20 ENCOUNTER — Emergency Department (HOSPITAL_COMMUNITY)
Admission: EM | Admit: 2015-08-20 | Discharge: 2015-08-20 | Disposition: A | Discharge status: Home / Self Care | Payer: Medicare Other | Attending: Emergency Medicine | Admitting: Emergency Medicine

## 2015-08-20 DIAGNOSIS — E079 Disorder of thyroid, unspecified: Secondary | ICD-10-CM | POA: Diagnosis not present

## 2015-08-20 DIAGNOSIS — Z86718 Personal history of other venous thrombosis and embolism: Secondary | ICD-10-CM | POA: Diagnosis not present

## 2015-08-20 DIAGNOSIS — F329 Major depressive disorder, single episode, unspecified: Secondary | ICD-10-CM | POA: Diagnosis not present

## 2015-08-20 DIAGNOSIS — R791 Abnormal coagulation profile: Secondary | ICD-10-CM | POA: Diagnosis not present

## 2015-08-20 DIAGNOSIS — R300 Dysuria: Secondary | ICD-10-CM

## 2015-08-20 DIAGNOSIS — Z880 Allergy status to penicillin: Secondary | ICD-10-CM | POA: Insufficient documentation

## 2015-08-20 DIAGNOSIS — R109 Unspecified abdominal pain: Secondary | ICD-10-CM

## 2015-08-20 DIAGNOSIS — E039 Hypothyroidism, unspecified: Secondary | ICD-10-CM | POA: Insufficient documentation

## 2015-08-20 DIAGNOSIS — I2510 Atherosclerotic heart disease of native coronary artery without angina pectoris: Secondary | ICD-10-CM | POA: Insufficient documentation

## 2015-08-20 DIAGNOSIS — J449 Chronic obstructive pulmonary disease, unspecified: Secondary | ICD-10-CM | POA: Insufficient documentation

## 2015-08-20 DIAGNOSIS — N390 Urinary tract infection, site not specified: Secondary | ICD-10-CM | POA: Diagnosis not present

## 2015-08-20 DIAGNOSIS — Z79899 Other long term (current) drug therapy: Secondary | ICD-10-CM | POA: Insufficient documentation

## 2015-08-20 DIAGNOSIS — K219 Gastro-esophageal reflux disease without esophagitis: Secondary | ICD-10-CM | POA: Insufficient documentation

## 2015-08-20 DIAGNOSIS — Z7901 Long term (current) use of anticoagulants: Secondary | ICD-10-CM | POA: Insufficient documentation

## 2015-08-20 DIAGNOSIS — E119 Type 2 diabetes mellitus without complications: Secondary | ICD-10-CM | POA: Diagnosis not present

## 2015-08-20 DIAGNOSIS — R1030 Lower abdominal pain, unspecified: Secondary | ICD-10-CM

## 2015-08-20 DIAGNOSIS — N179 Acute kidney failure, unspecified: Secondary | ICD-10-CM | POA: Insufficient documentation

## 2015-08-20 DIAGNOSIS — F419 Anxiety disorder, unspecified: Secondary | ICD-10-CM | POA: Insufficient documentation

## 2015-08-20 DIAGNOSIS — Z940 Kidney transplant status: Secondary | ICD-10-CM | POA: Diagnosis not present

## 2015-08-20 DIAGNOSIS — Z7951 Long term (current) use of inhaled steroids: Secondary | ICD-10-CM | POA: Insufficient documentation

## 2015-08-20 LAB — URINALYSIS, ROUTINE W REFLEX MICROSCOPIC
BILIRUBIN URINE: NEGATIVE
GLUCOSE, UA: NEGATIVE mg/dL
Ketones, ur: NEGATIVE mg/dL
Nitrite: POSITIVE — AB
Protein, ur: 100 mg/dL — AB
Specific Gravity, Urine: 1.015 (ref 1.005–1.030)
UROBILINOGEN UA: 0.2 mg/dL (ref 0.0–1.0)
pH: 5.5 (ref 5.0–8.0)

## 2015-08-20 LAB — CBC WITH DIFFERENTIAL/PLATELET
BASOS PCT: 0 %
Basophils Absolute: 0 10*3/uL (ref 0.0–0.1)
EOS ABS: 0 10*3/uL (ref 0.0–0.7)
Eosinophils Relative: 1 %
HEMATOCRIT: 40.9 % (ref 36.0–46.0)
Hemoglobin: 13.2 g/dL (ref 12.0–15.0)
Lymphocytes Relative: 18 %
Lymphs Abs: 1.6 10*3/uL (ref 0.7–4.0)
MCH: 29.5 pg (ref 26.0–34.0)
MCHC: 32.3 g/dL (ref 30.0–36.0)
MCV: 91.5 fL (ref 78.0–100.0)
MONO ABS: 0.6 10*3/uL (ref 0.1–1.0)
Monocytes Relative: 7 %
Neutro Abs: 6.6 10*3/uL (ref 1.7–7.7)
Neutrophils Relative %: 74 %
Platelets: 216 10*3/uL (ref 150–400)
RBC: 4.47 MIL/uL (ref 3.87–5.11)
RDW: 15.5 % (ref 11.5–15.5)
WBC: 8.8 10*3/uL (ref 4.0–10.5)

## 2015-08-20 LAB — URINE MICROSCOPIC-ADD ON

## 2015-08-20 LAB — PROTIME-INR
INR: 1.4 (ref 0.00–1.49)
PROTHROMBIN TIME: 17.2 s — AB (ref 11.6–15.2)

## 2015-08-20 MED ORDER — SODIUM CHLORIDE 0.9 % IV BOLUS (SEPSIS): 500.0000 mL | Freq: Once | INTRAVENOUS | Status: DC

## 2015-08-20 MED ORDER — FOSFOMYCIN TROMETHAMINE 3 G PO PACK
3.0000 g | PACK | Freq: Once | ORAL | Status: DC
Start: 2015-08-22 — End: 2016-02-10

## 2015-08-20 MED ORDER — FOSFOMYCIN TROMETHAMINE 3 G PO PACK
3.0000 g | PACK | Freq: Once | ORAL | Status: AC
  Administered 2015-08-20: 3 g via ORAL
  Filled 2015-08-20: qty 3

## 2015-08-20 NOTE — Discharge Instructions (Signed)
Stay very well hydrated with plenty of water throughout the day. You were given an antibiotic here to treat your urinary tract infection. You will need to follow up with the urologist who will look at the urine culture on Monday, and if he says it's okay to take the next dose of the antibiotic then fill the prescription at that time and take it. Use tylenol for pain. Follow up with Patricia [name] on Monday for your already scheduled appointment. Return to ER for emergent changing or worsening of symptoms. Please seek immediate care if you develop the following: You develop back pain.  Your symptoms are no better, or worse in 3 days. There is severe back pain or lower abdominal pain.  You develop chills.  You have a fever.  There is nausea or vomiting.  There is continued burning or discomfort with urination.     Urinary Tract Infection A urinary tract infection (UTI) can occur any place along the urinary tract. The tract includes the kidneys, ureters, bladder, and urethra. A type of germ called bacteria often causes a UTI. UTIs are often helped with antibiotic medicine.  HOME CARE   If given, take antibiotics as told by your doctor. Finish them even if you start to feel better.  Drink enough fluids to keep your pee (urine) clear or pale yellow.  Avoid tea, drinks with caffeine, and bubbly (carbonated) drinks.  Pee often. Avoid holding your pee in for a long time.  Pee before and after having sex (intercourse).  Wipe from front to back after you poop (bowel movement) if you are a woman. Use each tissue only once. GET HELP RIGHT AWAY IF:   You have back pain.  You have lower belly (abdominal) pain.  You have chills.  You feel sick to your stomach (nauseous).  You throw up (vomit).  Your burning or discomfort with peeing does not go away.  You have a fever.  Your symptoms are not better in 3 days. MAKE SURE YOU:   Understand these instructions.  Will watch your  condition.  Will get help right away if you are not doing well or get worse.   This information is not intended to replace advice given to you by your health care provider. Make sure you discuss any questions you have with your health care provider.   Document Released: 03/19/2008 Document Revised: 10/22/2014 Document Reviewed: 05/01/2012 Elsevier Interactive Patient Education 2016 Elsevier Inc.  [name] Pain [name] pain refers to pain that is located on the side of the body between the upper abdomen and the back. The pain may occur over a short period of time (acute) or may be long-term or reoccurring (chronic). It may be mild or severe. [name] pain can be caused by many things. CAUSES  Some of the more common causes of [name] pain include:  Muscle strains.   Muscle spasms.   A disease of your spine (vertebral disk disease).   A lung infection (pneumonia).   Fluid around your lungs (pulmonary edema).   A kidney infection.   Kidney stones.   A very painful skin rash caused by the chickenpox virus (shingles).   Gallbladder disease.  [place] care will depend on the cause of your pain. In general,  Rest as directed by your caregiver.  Drink enough fluids to keep your urine clear or pale yellow.  Only take over-the-counter or prescription medicines as directed by your caregiver. Some medicines may help relieve the pain.  Tell your caregiver about any changes in your pain.  Follow up with your caregiver as directed. SEEK IMMEDIATE MEDICAL CARE IF:   Your pain is not controlled with medicine.   You have new or worsening symptoms.  Your pain increases.   You have abdominal pain.   You have shortness of breath.   You have persistent nausea or vomiting.   You have swelling in your abdomen.   You feel faint or pass out.   You have blood in your urine.  You have a fever or persistent symptoms for more than 2-3 days.  You have a  fever and your symptoms suddenly get worse. MAKE SURE YOU:   Understand these instructions.  Will watch your condition.  Will get help right away if you are not doing well or get worse.   This information is not intended to replace advice given to you by your health care provider. Make sure you discuss any questions you have with your health care provider.   Document Released: 11/22/2005 Document Revised: 06/25/2012 Document Reviewed: 05/15/2012 Elsevier Interactive Patient Education 2016 Elsevier Inc.  Acute Kidney Injury Acute kidney injury is any condition in which there is sudden (acute) damage to the kidneys. Acute kidney injury was previously known as acute kidney failure or acute renal failure. The kidneys are two organs that lie on either side of the spine between the middle of the back and the front of the abdomen. The kidneys:  Remove wastes and extra water from the blood.   Produce important hormones. These help keep bones strong, regulate blood pressure, and help create red blood cells.   Balance the fluids and chemicals in the blood and tissues. A small amount of kidney damage may not cause problems, but a large amount of damage may make it difficult or impossible for the kidneys to work the way they should. Acute kidney injury may develop into long-lasting (chronic) kidney disease. It may also develop into a life-threatening disease called end-stage kidney disease. Acute kidney injury can get worse very quickly, so it should be treated right away. Early treatment may prevent other kidney diseases from developing. CAUSES   A problem with blood flow to the kidneys. This may be caused by:   Blood loss.   Heart disease.   Severe burns.   Liver disease.  Direct damage to the kidneys. This may be caused by:  Some medicines.   A kidney infection.   Poisoning or consuming toxic substances.   A surgical wound.   A blow to the kidney area.   A problem  with urine flow. This may be caused by:   Cancer.   Kidney stones.   An enlarged prostate. SIGNS AND SYMPTOMS   Swelling (edema) of the legs, ankles, or feet.   Tiredness (lethargy).   Nausea or vomiting.   Confusion.   Problems with urination, such as:   Painful or burning feeling during urination.   Decreased urine production.   Frequent accidents in children who are potty trained.   Bloody urine.   Muscle twitches and cramps.   Shortness of breath.   Seizures.   Chest pain or pressure. Sometimes, no symptoms are present. DIAGNOSIS Acute kidney injury may be detected and diagnosed by tests, including blood, urine, imaging, or kidney biopsy tests.  TREATMENT Treatment of acute kidney injury varies depending on the cause and severity of the kidney damage. In mild cases, no treatment may be needed. The kidneys may heal on their own.  If acute kidney injury is more severe, your health care provider will treat the cause of the kidney damage, help the kidneys heal, and prevent complications from occurring. Severe cases may require a procedure to remove toxic wastes from the body (dialysis) or surgery to repair kidney damage. Surgery may involve:   Repair of a torn kidney.   Removal of an obstruction. HOME CARE INSTRUCTIONS  Follow your prescribed diet.  Take medicines only as directed by your health care provider.  Do not take any new medicines (prescription, over-the-counter, or nutritional supplements) unless approved by your health care provider. Many medicines can worsen your kidney damage or may need to have the dose adjusted.   Keep all follow-up visits as directed by your health care provider. This is important.  Observe your condition to make sure you are healing as expected. SEEK IMMEDIATE MEDICAL CARE IF:  You are feeling ill or have severe pain in the back or side.   Your symptoms return or you have new symptoms.  You have any  symptoms of end-stage kidney disease. These include:   Persistent itchiness.   Loss of appetite.   Headaches.   Abnormally dark or light skin.  Numbness in the hands or feet.   Easy bruising.   Frequent hiccups.   Menstruation stops.   You have a fever.  You have increased urine production.  You have pain or bleeding when urinating. MAKE SURE YOU:   Understand these instructions.  Will watch your condition.  Will get help right away if you are not doing well or get worse.   This information is not intended to replace advice given to you by your health care provider. Make sure you discuss any questions you have with your health care provider.   Document Released: 04/16/2011 Document Revised: 10/22/2014 Document Reviewed: 05/30/2012 Elsevier Interactive Patient Education [employer].

## 2015-08-20 NOTE — ED Provider Notes (Signed)
CSN: [id number]     Arrival date & time 08/20/15  1231 History   First MD Initiated Contact with Patient 08/20/15 1251     Chief Complaint  Patient presents with  . Abdominal Pain  . Dysuria     (Consider location/radiation/quality/duration/timing/severity/associated sxs/prior Treatment) HPI Comments: Patricia [name] is a 81 y.o. female with a PMHx of nephrectomy, CAD, hypothyroidism, HTN, GERD, diverticulitis, COPD, chronic dizziness, DM2, and DVT on coumadin (last INR 1.1 due to holding coumadin for an injection with Dr. Nelva Bush, but recently restarted on coumadin), who presents to the ED with complaints of UTI symptoms. Patient reports that she has a history of frequent UTIs, last UTI was 2 weeks ago, and states that this seems consistent with all of her prior UTIs. She reports that around 9 AM she developed 4/10 suprapubic burning pain which is constant radiating into her lower back worse on the left, worsening with urination, with no treatments tried prior to arrival. Associated symptoms include dysuria, bilateral flank pain, and urinary frequency. She denies any hematuria, fevers, chills, chest pain, shortness breath, nausea, vomiting, diarrhea, constipation, hematochezia, melena, vaginal bleeding or discharge, numbness, tingling, weakness, urinary incontinence, or cauda equina symptoms. She sees Dr. Worthy Flank at [facility] urology, has an appt on Monday with him to check back after her recent UTI.  Patient is a 81 y.o. female presenting with dysuria. The history is provided by the patient and medical records. No language interpreter was used.  Dysuria Pain quality:  Burning Pain severity:  Mild Onset quality:  Gradual Duration:  4 hours Timing:  Constant Progression:  Unchanged Chronicity:  Recurrent Recent urinary tract infections: yes   Relieved by:  None tried Exacerbated by: urination. Ineffective treatments:  None tried Urinary symptoms: frequent urination   Urinary symptoms: no  hematuria and no bladder incontinence   Associated symptoms: abdominal pain (suprapubic) and flank pain   Associated symptoms: no fever, no nausea, no vaginal discharge and no vomiting   Risk factors: recurrent urinary tract infections and renal disease     Past Medical History  Diagnosis Date  . Renal disorder   . Hx of kidney transplant     right  . Coronary artery disease   . Hypertension   . Thyroid disease   . Multiple allergies   . Abnormal gait   . Reflux   . Diverticulitis   . Hypothyroidism   . Depression   . Anxiety   . COPD, mild   . Dizziness   . Diabetes   . DVT (deep venous thrombosis)    Past Surgical History  Procedure Laterality Date  . Nephrectomy transplanted organ    . Tumor removal      Brain    Family History  Problem Relation Age of Onset  . Alzheimer's disease Mother   . Alcoholism Father   . High Cholesterol Mother    Social History  Substance Use Topics  . Smoking status: Never Smoker   . Smokeless tobacco: Never Used  . Alcohol Use: No   OB History    No data available     Review of Systems  Constitutional: Negative for fever and chills.  Respiratory: Negative for shortness of breath.   Cardiovascular: Negative for chest pain.  Gastrointestinal: Positive for abdominal pain (suprapubic). Negative for nausea, vomiting, diarrhea and constipation.  Genitourinary: Positive for dysuria, frequency and flank pain. Negative for hematuria, vaginal bleeding and vaginal discharge.  Musculoskeletal: Negative for myalgias and arthralgias.  Skin: Negative for  color change.  Allergic/Immunologic: Positive for immunocompromised state (diabetic).  Neurological: Negative for weakness and numbness.  Psychiatric/Behavioral: Negative for confusion.   10 Systems reviewed and are negative for acute change except as noted in the HPI.    Allergies  Macrolides and ketolides; Aricept; Codeine; Penicillins; Statins; Streptomycin; and Tetracyclines &  related  Home Medications   Prior to Admission medications   Medication Sig Start Date End Date Taking? Authorizing Provider  albuterol (PROVENTIL HFA;VENTOLIN HFA) 108 (90 BASE) MCG/ACT inhaler Inhale 2 puffs into the lungs every 6 (six) hours as needed for wheezing or shortness of breath. Patient uses this medication prn.    Historical Provider, MD  amLODipine (NORVASC) 5 MG tablet Take 5 mg by mouth daily. 06/25/13   Historical Provider, MD  azaTHIOprine (IMURAN) 50 MG tablet Take 50 mg by mouth 2 (two) times daily.     Historical Provider, MD  budesonide-formoterol (SYMBICORT) 160-4.5 MCG/ACT inhaler Inhale 2 puffs into the lungs daily.     Historical Provider, MD  cetirizine (ZYRTEC) 10 MG tablet Take 10 mg by mouth every morning.     Historical Provider, MD  Cholecalciferol (VITAMIN D) 2000 UNITS tablet Take 2,000 Units by mouth every evening.     Historical Provider, MD  cloNIDine (CATAPRES) 0.1 MG tablet Take 0.1 mg by mouth 2 (two) times daily.    Historical Provider, MD  Coenzyme Q10 (CO Q 10) 100 MG CAPS Take 1 tablet by mouth every evening.     Historical Provider, MD  colesevelam (WELCHOL) 625 MG tablet Take 1,875 mg by mouth 2 (two) times daily with a meal.    Historical Provider, MD  cycloSPORINE modified (NEORAL) 25 MG capsule Take 2 capsules (50 mg total) by mouth 2 (two) times daily. 05/27/15   Delfina Redwood, MD  ENABLEX 7.5 MG 24 hr tablet Take 7.5 mg by mouth every morning.  06/16/13   Historical Provider, MD  fenofibrate (TRICOR) 48 MG tablet Take 48 mg by mouth daily.    Historical Provider, MD  FLUoxetine (PROZAC) 20 MG tablet Take 20 mg by mouth daily.    Historical Provider, MD  fluticasone (FLONASE) 50 MCG/ACT nasal spray Place 1 spray into both nostrils daily as needed for allergies or rhinitis.    Historical Provider, MD  furosemide (LASIX) 40 MG tablet Take 2 tablets (80 mg total) by mouth 2 (two) times daily. 80mg  in the morning and 40mg  in the evening 05/27/15    Delfina Redwood, MD  hyoscyamine (ANASPAZ) 0.125 MG TBDP Place 0.125 mg under the tongue every 6 (six) hours as needed for bladder spasms. Patient uses this medication prn.    Historical Provider, MD  L-Lysine 500 MG TABS Take 500 mg by mouth daily.    Historical Provider, MD  levothyroxine (SYNTHROID, LEVOTHROID) 75 MCG tablet Take 75 mcg by mouth daily.    Historical Provider, MD  metoprolol succinate (TOPROL-XL) 100 MG 24 hr tablet Take 100 mg by mouth daily. Take with or immediately following a meal.    Historical Provider, MD  mirabegron ER (MYRBETRIQ) 25 MG TB24 tablet Take 25 mg by mouth every evening.     Historical Provider, MD  Multiple Vitamin (MULTIVITAMIN) tablet Take 1 tablet by mouth daily.    Historical Provider, MD  omega-3 fish oil (MAXEPA) 1000 MG CAPS capsule Take 1 capsule by mouth daily.    Historical Provider, MD  omeprazole (PRILOSEC) 20 MG capsule Take 20 mg by mouth daily.  Historical Provider, MD  ondansetron (ZOFRAN) 4 MG tablet Take 4 mg by mouth every 8 (eight) hours as needed for nausea or vomiting.    Historical Provider, MD  oxybutynin (DITROPAN-XL) 10 MG 24 hr tablet Take 10 mg by mouth at bedtime.    Historical Provider, MD  potassium chloride SA (K-DUR,KLOR-CON) 20 MEQ tablet Take 40 mEq by mouth daily.    Historical Provider, MD  predniSONE (DELTASONE) 5 MG tablet Take 5 mg by mouth daily with breakfast.    Historical Provider, MD  Probiotic Product (ALIGN) 4 MG CAPS Take 1 tablet by mouth daily.    Historical Provider, MD  Resveratrol 100 MG CAPS Take 1 capsule by mouth daily.    Historical Provider, MD  sulfamethoxazole-trimethoprim (BACTRIM,SEPTRA) 400-80 MG per tablet Take 1 tablet by mouth 3 (three) times a week. Monday, Wednesday, and Friday.    Historical Provider, MD  warfarin (COUMADIN) 3 MG tablet Take 3 mg by mouth daily. Tues,Thurs,Sat,Sun    Historical Provider, MD  warfarin (COUMADIN) 5 MG tablet Alternate 2.5 mg and 5 mg daily. Patient  taking differently: Take 5 mg by mouth daily at 6 PM. Mon, Wed, Fri 05/27/15   Delfina Redwood, MD   BP 142/59 mmHg  Pulse 55  Temp(Src) 97.7 F (36.5 C) (Oral)  Resp 16  SpO2 98% Physical Exam  Constitutional: She is oriented to person, place, and time. Vital signs are normal. She appears well-developed and well-nourished.  Non-toxic appearance. No distress.  Afebrile, nontoxic, NAD  HENT:  Head: Normocephalic and atraumatic.  Mouth/Throat: Oropharynx is clear and moist and mucous membranes are normal.  Eyes: Conjunctivae and EOM are normal. Right eye exhibits no discharge. Left eye exhibits no discharge.  Neck: Normal range of motion. Neck supple.  Cardiovascular: Normal rate, regular rhythm, normal heart sounds and intact distal pulses.  Exam reveals no gallop and no friction rub.   No murmur heard. Pulmonary/Chest: Effort normal and breath sounds normal. No respiratory distress. She has no decreased breath sounds. She has no wheezes. She has no rhonchi. She has no rales.  Abdominal: Soft. Normal appearance and bowel sounds are normal. She exhibits no distension. There is tenderness in the suprapubic area. There is CVA tenderness. There is no rigidity, no rebound, no guarding, no tenderness at McBurney's point and negative Murphy's sign.    Soft, nondistended, +BS throughout, with mild suprapubic TTP, no r/g/r, neg murphy's, neg mcburney's, mild b/l CVA TTP   Musculoskeletal: Normal range of motion.  Neurological: She is alert and oriented to person, place, and time. She has normal strength. No sensory deficit.  Skin: Skin is warm, dry and intact. No rash noted.  Psychiatric: She has a normal mood and affect.  Nursing note and vitals reviewed.   ED Course  Procedures (including critical care time) Labs Review Labs Reviewed  URINALYSIS, ROUTINE W REFLEX MICROSCOPIC (NOT AT [facility]) - Abnormal; Notable for the following:    APPearance TURBID (*)    Hgb urine dipstick MODERATE (*)     Protein, ur 100 (*)    Nitrite POSITIVE (*)    Leukocytes, UA LARGE (*)    All other components within normal limits  PROTIME-INR - Abnormal; Notable for the following:    Prothrombin Time 17.2 (*)    All other components within normal limits  URINE MICROSCOPIC-ADD ON - Abnormal; Notable for the following:    Squamous Epithelial / LPF FEW (*)    Bacteria, UA MANY (*)  All other components within normal limits  URINE CULTURE  CBC WITH DIFFERENTIAL/PLATELET  I-STAT CHEM 8, ED    Urine culture 05/22/15  Status: Finalresult Visible to patient:  Not Released Nextappt: None         Newer results are available. Click to view them now.        33mo ago    Specimen Description URINE, CATHETERIZED   Special Requests NONE   Culture >=100,000 COLONIES/mL ESCHERICHIA COLI  Confirmed Extended Spectrum Beta-Lactamase Producer (ESBL)       Report Status 05/24/2015 FINAL   Organism ID, Bacteria ESCHERICHIA COLI   Resulting Agency [facility]    Culture & Susceptibility      ESCHERICHIA COLI     Antibiotic Sensitivity Microscan Status    AMPICILLIN Resistant >=32 RESISTANT Final    Method: MIC    AMPICILLIN/SULBACTAM Resistant >=32 RESISTANT Final    Method: MIC    CEFAZOLIN Resistant >=64 RESISTANT Final    Method: MIC    CEFTRIAXONE Resistant >=64 RESISTANT Final    Method: MIC    CIPROFLOXACIN Resistant >=4 RESISTANT Final    Method: MIC    GENTAMICIN Sensitive <=1 SENSITIVE Final    Method: MIC    IMIPENEM Sensitive <=0.25 SENSITIVE Final    Method: MIC    NITROFURANTOIN Sensitive <=16 SENSITIVE Final    Method: MIC    PIP/TAZO Sensitive <=4 SENSITIVE Final    Method: MIC    TRIMETH/SULFA Resistant >=320 RESISTANT Final    Method: MIC    Comments ESCHERICHIA COLI (MIC)    >=100,000 COLONIES/mL ESCHERICHIA COLI              Imaging Review No results found. I have personally reviewed and  evaluated these images and lab results as part of my medical decision-making.   EKG Interpretation None      MDM   Final diagnoses:  Suprapubic pain, unspecified laterality  Dysuria  UTI (lower urinary tract infection)  Flank pain  Subtherapeutic international normalized ratio (INR)  AKI (acute kidney injury) ([place])    81 y.o. female here with UTI symptoms. Hx of frequent UTIs. Slight flank pain, mild tenderness bilaterally. Afebrile and VSS. Mild tenderness in suprapubic region, no other abd tenderness. Doubt nephrolithiasis. Will check CBC and istat chem 8 to check kidney function and check for leukocytosis. Will get U/A. Prior UCx show E.coli with sensitivity to macrobid. Will check INR as well since pt restarted coumadin recently after having it held for an injection. Will reassess shortly.   4:01 PM U/A grossly positive, +nitrites, +leuks, few squamous, TNTC WBC, many bacteria. Definite UTI. Last susceptibility showed sensitivity to macrobid, as long as kidney function preserved we can do a 10 day course of this. CBC unremarkable, doubt pyelo. INR subtherapeutic at 1.4, likely due to recently starting back on it and not yet achieving therapeutic level, but given that she'll start on abx now this would cause her INR to become elevated therefore doubt need to adjust coumadin regimen. Awaiting istat chem 8 to eval kidney function, will call lab to ask regarding this. Will reassess shortly.   4:30 PM Chem 8 results not crossing over, walked over. Reveals Cr 2.0 (baseline 1.2), BUN 37, otherwise unremarkable. Discussed with pharmacy what options exist for her given the multi-resistant organisms she has grown as well as her multiple allergies, and current AKI, and they reported that fosfomycin 3g now and in 48hrs should cover for e.coli and won't affect kidney function. Will have  her f/up with urology on Monday who can f/up with her UCx from today. Also discussed continuing coumadin as  directed, f/up with PCP for recheck of INR. Discussed staying hydrated to help her kidney function. I explained the diagnosis and have given explicit precautions to return to the ER including for any other new or worsening symptoms. The patient understands and accepts the medical plan as it's been dictated and I have answered their questions. Discharge instructions concerning home care and prescriptions have been given. The patient is STABLE and is discharged to home in good condition.  BP 141/54 mmHg  Pulse 55  Temp(Src) 97.7 F (36.5 C) (Oral)  Resp 14  SpO2 97%  Meds ordered this encounter  Medications  . fosfomycin (MONUROL) packet 3 g    Sig:   . fosfomycin (MONUROL) 3 G PACK    Sig: Take 3 g by mouth once. To be taken on 08/22/15    Dispense:  1 packet    Refill:  0    Order Specific Question:  Supervising Provider    Answer:  Gwyndolyn Kaufman     Kelan Pritt Camprubi-Soms, PA-C 08/20/15 1650  Blanchie Dessert, MD 08/21/15 [phone]

## 2015-08-20 NOTE — ED Notes (Signed)
I stat chem 8 results not showing in EPIC, called mini lab and lab tech reports lab resulted, not sure why crossing over.

## 2015-08-20 NOTE — ED Notes (Signed)
Pt reports onset today 9am LLQ abd pain radiating to bilateral flank,dysuria and frequency.  NO blood noted in urine, fevers or chills.  Pt treated with antibiotics, is due to return to urologists 08-22-15 for recheck.

## 2015-08-20 NOTE — ED Notes (Signed)
Printout of Istat chem 8 given to [place], [place] for review as results still not showing in EPIC.

## 2015-08-20 NOTE — ED Notes (Signed)
Patient is resting comfortably. 

## 2015-08-22 LAB — URINE CULTURE

## 2016-02-08 ENCOUNTER — Emergency Department (HOSPITAL_COMMUNITY): Payer: Medicare Other

## 2016-02-08 ENCOUNTER — Encounter (HOSPITAL_COMMUNITY): Payer: Self-pay | Admitting: *Deleted

## 2016-02-08 ENCOUNTER — Inpatient Hospital Stay (HOSPITAL_COMMUNITY)
Admission: EM | Admit: 2016-02-08 | Discharge: 2016-02-14 | DRG: 470 | Disposition: A | Discharge status: Skilled Nursing Facility | Payer: Medicare Other | Attending: Family Medicine | Admitting: Family Medicine

## 2016-02-08 DIAGNOSIS — Z79899 Other long term (current) drug therapy: Secondary | ICD-10-CM | POA: Diagnosis not present

## 2016-02-08 DIAGNOSIS — Z881 Allergy status to other antibiotic agents status: Secondary | ICD-10-CM | POA: Diagnosis not present

## 2016-02-08 DIAGNOSIS — D62 Acute posthemorrhagic anemia: Secondary | ICD-10-CM | POA: Diagnosis not present

## 2016-02-08 DIAGNOSIS — I2510 Atherosclerotic heart disease of native coronary artery without angina pectoris: Secondary | ICD-10-CM | POA: Diagnosis present

## 2016-02-08 DIAGNOSIS — N183 Chronic kidney disease, stage 3 (moderate): Secondary | ICD-10-CM | POA: Diagnosis present

## 2016-02-08 DIAGNOSIS — K219 Gastro-esophageal reflux disease without esophagitis: Secondary | ICD-10-CM | POA: Diagnosis present

## 2016-02-08 DIAGNOSIS — J42 Unspecified chronic bronchitis: Secondary | ICD-10-CM | POA: Diagnosis not present

## 2016-02-08 DIAGNOSIS — Z7901 Long term (current) use of anticoagulants: Secondary | ICD-10-CM

## 2016-02-08 DIAGNOSIS — B9620 Unspecified Escherichia coli [E. coli] as the cause of diseases classified elsewhere: Secondary | ICD-10-CM | POA: Diagnosis present

## 2016-02-08 DIAGNOSIS — F329 Major depressive disorder, single episode, unspecified: Secondary | ICD-10-CM | POA: Diagnosis present

## 2016-02-08 DIAGNOSIS — Z880 Allergy status to penicillin: Secondary | ICD-10-CM

## 2016-02-08 DIAGNOSIS — Z940 Kidney transplant status: Secondary | ICD-10-CM | POA: Diagnosis not present

## 2016-02-08 DIAGNOSIS — Z09 Encounter for follow-up examination after completed treatment for conditions other than malignant neoplasm: Secondary | ICD-10-CM

## 2016-02-08 DIAGNOSIS — W010XXA Fall on same level from slipping, tripping and stumbling without subsequent striking against object, initial encounter: Secondary | ICD-10-CM | POA: Diagnosis present

## 2016-02-08 DIAGNOSIS — S72001A Fracture of unspecified part of neck of right femur, initial encounter for closed fracture: Principal | ICD-10-CM | POA: Diagnosis present

## 2016-02-08 DIAGNOSIS — Y92009 Unspecified place in unspecified non-institutional (private) residence as the place of occurrence of the external cause: Secondary | ICD-10-CM | POA: Diagnosis not present

## 2016-02-08 DIAGNOSIS — Z1624 Resistance to multiple antibiotics: Secondary | ICD-10-CM | POA: Diagnosis present

## 2016-02-08 DIAGNOSIS — I5032 Chronic diastolic (congestive) heart failure: Secondary | ICD-10-CM | POA: Diagnosis present

## 2016-02-08 DIAGNOSIS — I499 Cardiac arrhythmia, unspecified: Secondary | ICD-10-CM | POA: Diagnosis present

## 2016-02-08 DIAGNOSIS — N3000 Acute cystitis without hematuria: Secondary | ICD-10-CM | POA: Diagnosis not present

## 2016-02-08 DIAGNOSIS — Z888 Allergy status to other drugs, medicaments and biological substances status: Secondary | ICD-10-CM

## 2016-02-08 DIAGNOSIS — J449 Chronic obstructive pulmonary disease, unspecified: Secondary | ICD-10-CM | POA: Diagnosis present

## 2016-02-08 DIAGNOSIS — I130 Hypertensive heart and chronic kidney disease with heart failure and stage 1 through stage 4 chronic kidney disease, or unspecified chronic kidney disease: Secondary | ICD-10-CM | POA: Diagnosis present

## 2016-02-08 DIAGNOSIS — Z87440 Personal history of urinary (tract) infections: Secondary | ICD-10-CM

## 2016-02-08 DIAGNOSIS — S72009A Fracture of unspecified part of neck of unspecified femur, initial encounter for closed fracture: Secondary | ICD-10-CM | POA: Diagnosis present

## 2016-02-08 DIAGNOSIS — S72001S Fracture of unspecified part of neck of right femur, sequela: Secondary | ICD-10-CM | POA: Diagnosis not present

## 2016-02-08 DIAGNOSIS — F419 Anxiety disorder, unspecified: Secondary | ICD-10-CM | POA: Diagnosis present

## 2016-02-08 DIAGNOSIS — E119 Type 2 diabetes mellitus without complications: Secondary | ICD-10-CM

## 2016-02-08 DIAGNOSIS — M1990 Unspecified osteoarthritis, unspecified site: Secondary | ICD-10-CM | POA: Diagnosis present

## 2016-02-08 DIAGNOSIS — E038 Other specified hypothyroidism: Secondary | ICD-10-CM | POA: Diagnosis not present

## 2016-02-08 DIAGNOSIS — Z86718 Personal history of other venous thrombosis and embolism: Secondary | ICD-10-CM | POA: Diagnosis not present

## 2016-02-08 DIAGNOSIS — J438 Other emphysema: Secondary | ICD-10-CM

## 2016-02-08 DIAGNOSIS — I739 Peripheral vascular disease, unspecified: Secondary | ICD-10-CM | POA: Diagnosis present

## 2016-02-08 DIAGNOSIS — Z419 Encounter for procedure for purposes other than remedying health state, unspecified: Secondary | ICD-10-CM

## 2016-02-08 DIAGNOSIS — M79606 Pain in leg, unspecified: Secondary | ICD-10-CM | POA: Diagnosis present

## 2016-02-08 DIAGNOSIS — I824Y9 Acute embolism and thrombosis of unspecified deep veins of unspecified proximal lower extremity: Secondary | ICD-10-CM | POA: Diagnosis present

## 2016-02-08 DIAGNOSIS — W19XXXA Unspecified fall, initial encounter: Secondary | ICD-10-CM

## 2016-02-08 DIAGNOSIS — Z886 Allergy status to analgesic agent status: Secondary | ICD-10-CM

## 2016-02-08 DIAGNOSIS — E039 Hypothyroidism, unspecified: Secondary | ICD-10-CM | POA: Diagnosis present

## 2016-02-08 DIAGNOSIS — N390 Urinary tract infection, site not specified: Secondary | ICD-10-CM | POA: Diagnosis present

## 2016-02-08 DIAGNOSIS — N3281 Overactive bladder: Secondary | ICD-10-CM | POA: Diagnosis present

## 2016-02-08 DIAGNOSIS — E1122 Type 2 diabetes mellitus with diabetic chronic kidney disease: Secondary | ICD-10-CM

## 2016-02-08 DIAGNOSIS — F32A Depression, unspecified: Secondary | ICD-10-CM | POA: Diagnosis present

## 2016-02-08 DIAGNOSIS — N184 Chronic kidney disease, stage 4 (severe): Secondary | ICD-10-CM

## 2016-02-08 LAB — CBC WITH DIFFERENTIAL/PLATELET
Basophils Absolute: 0 10*3/uL (ref 0.0–0.1)
Basophils Relative: 0 %
EOS PCT: 0 %
Eosinophils Absolute: 0 10*3/uL (ref 0.0–0.7)
HCT: 38.8 % (ref 36.0–46.0)
Hemoglobin: 12.8 g/dL (ref 12.0–15.0)
LYMPHS ABS: 1.7 10*3/uL (ref 0.7–4.0)
Lymphocytes Relative: 21 %
MCH: 30.3 pg (ref 26.0–34.0)
MCHC: 33 g/dL (ref 30.0–36.0)
MCV: 91.7 fL (ref 78.0–100.0)
MONOS PCT: 5 %
Monocytes Absolute: 0.4 10*3/uL (ref 0.1–1.0)
NEUTROS PCT: 74 %
Neutro Abs: 5.8 10*3/uL (ref 1.7–7.7)
Platelets: 219 10*3/uL (ref 150–400)
RBC: 4.23 MIL/uL (ref 3.87–5.11)
RDW: 15.7 % — ABNORMAL HIGH (ref 11.5–15.5)
WBC: 7.9 10*3/uL (ref 4.0–10.5)

## 2016-02-08 LAB — I-STAT CHEM 8, ED
BUN: 29 mg/dL — AB (ref 6–20)
CHLORIDE: 109 mmol/L (ref 101–111)
Calcium, Ion: 1.12 mmol/L — ABNORMAL LOW (ref 1.13–1.30)
Creatinine, Ser: 2 mg/dL — ABNORMAL HIGH (ref 0.44–1.00)
Glucose, Bld: 138 mg/dL — ABNORMAL HIGH (ref 65–99)
HEMATOCRIT: 42 % (ref 36.0–46.0)
Hemoglobin: 14.3 g/dL (ref 12.0–15.0)
Potassium: 4.1 mmol/L (ref 3.5–5.1)
Sodium: 141 mmol/L (ref 135–145)
TCO2: 19 mmol/L (ref 0–100)

## 2016-02-08 LAB — BASIC METABOLIC PANEL
ANION GAP: 12 (ref 5–15)
BUN: 31 mg/dL — ABNORMAL HIGH (ref 6–20)
CALCIUM: 10 mg/dL (ref 8.9–10.3)
CHLORIDE: 109 mmol/L (ref 101–111)
CO2: 21 mmol/L — AB (ref 22–32)
Creatinine, Ser: 1.92 mg/dL — ABNORMAL HIGH (ref 0.44–1.00)
GFR calc non Af Amer: 23 mL/min — ABNORMAL LOW (ref >60)
GFR, EST AFRICAN AMERICAN: 27 mL/min — AB (ref >60)
Glucose, Bld: 147 mg/dL — ABNORMAL HIGH (ref 65–99)
Potassium: 4.2 mmol/L (ref 3.5–5.1)
Sodium: 142 mmol/L (ref 135–145)

## 2016-02-08 LAB — GLUCOSE, CAPILLARY: GLUCOSE-CAPILLARY: 120 mg/dL — AB (ref 65–99)

## 2016-02-08 LAB — TROPONIN I: Troponin I: 0.03 ng/mL (ref <0.031)

## 2016-02-08 LAB — TYPE AND SCREEN
ABO/RH(D): O POS
ANTIBODY SCREEN: NEGATIVE

## 2016-02-08 LAB — PROTIME-INR
INR: 2.18 — ABNORMAL HIGH (ref 0.00–1.49)
INR: 2.52 — ABNORMAL HIGH (ref 0.00–1.49)
Prothrombin Time: 24.1 seconds — ABNORMAL HIGH (ref 11.6–15.2)
Prothrombin Time: 26.8 seconds — ABNORMAL HIGH (ref 11.6–15.2)

## 2016-02-08 LAB — ABO/RH: ABO/RH(D): O POS

## 2016-02-08 MED ORDER — IPRATROPIUM-ALBUTEROL 0.5-2.5 (3) MG/3ML IN SOLN: 3.0000 mL | RESPIRATORY_TRACT | Status: DC | PRN

## 2016-02-08 MED ORDER — POLYETHYLENE GLYCOL 3350 17 G PO PACK
17.0000 g | PACK | Freq: Every day | ORAL | Status: DC | PRN
  Administered 2016-02-13: 17 g via ORAL
  Filled 2016-02-08: qty 1

## 2016-02-08 MED ORDER — FENTANYL CITRATE (PF) 100 MCG/2ML IJ SOLN
25.0000 ug | Freq: Once | INTRAMUSCULAR | Status: AC
  Administered 2016-02-08: 25 ug via INTRAVENOUS
  Filled 2016-02-08: qty 2

## 2016-02-08 MED ORDER — FLUTICASONE PROPIONATE 50 MCG/ACT NA SUSP: 1.0000 | Freq: Every day | NASAL | Status: DC | PRN

## 2016-02-08 MED ORDER — MIRABEGRON ER 25 MG PO TB24
25.0000 mg | ORAL_TABLET | Freq: Every evening | ORAL | Status: DC
  Administered 2016-02-09 – 2016-02-13 (×5): 25 mg via ORAL
  Filled 2016-02-08 (×5): qty 1

## 2016-02-08 MED ORDER — AZATHIOPRINE 50 MG PO TABS
50.0000 mg | ORAL_TABLET | Freq: Two times a day (BID) | ORAL | Status: DC
  Administered 2016-02-08 – 2016-02-14 (×11): 50 mg via ORAL
  Filled 2016-02-08 (×14): qty 1

## 2016-02-08 MED ORDER — OXYBUTYNIN CHLORIDE ER 10 MG PO TB24
10.0000 mg | ORAL_TABLET | Freq: Every day | ORAL | Status: DC
  Administered 2016-02-08 – 2016-02-13 (×6): 10 mg via ORAL
  Filled 2016-02-08 (×6): qty 1

## 2016-02-08 MED ORDER — HYOSCYAMINE SULFATE 0.125 MG PO TBDP
0.1250 mg | ORAL_TABLET | Freq: Four times a day (QID) | ORAL | Status: DC | PRN
  Filled 2016-02-08: qty 1

## 2016-02-08 MED ORDER — CYCLOSPORINE MODIFIED (NEORAL) 25 MG PO CAPS
75.0000 mg | ORAL_CAPSULE | Freq: Two times a day (BID) | ORAL | Status: DC
  Administered 2016-02-08 – 2016-02-09 (×3): 75 mg via ORAL
  Filled 2016-02-08 (×5): qty 3

## 2016-02-08 MED ORDER — LORATADINE 10 MG PO TABS
10.0000 mg | ORAL_TABLET | Freq: Every day | ORAL | Status: DC
  Administered 2016-02-08 – 2016-02-14 (×6): 10 mg via ORAL
  Filled 2016-02-08 (×6): qty 1

## 2016-02-08 MED ORDER — RISAQUAD PO CAPS
1.0000 | ORAL_CAPSULE | Freq: Every day | ORAL | Status: DC
  Administered 2016-02-08 – 2016-02-14 (×6): 1 via ORAL
  Filled 2016-02-08 (×8): qty 1

## 2016-02-08 MED ORDER — ADULT MULTIVITAMIN W/MINERALS CH
1.0000 | ORAL_TABLET | Freq: Every day | ORAL | Status: DC
  Administered 2016-02-09 – 2016-02-14 (×5): 1 via ORAL
  Filled 2016-02-08 (×5): qty 1

## 2016-02-08 MED ORDER — FUROSEMIDE 40 MG PO TABS
40.0000 mg | ORAL_TABLET | Freq: Every evening | ORAL | Status: DC
  Administered 2016-02-09 – 2016-02-10 (×3): 40 mg via ORAL
  Filled 2016-02-08 (×3): qty 1

## 2016-02-08 MED ORDER — CLONIDINE HCL 0.1 MG PO TABS
0.1000 mg | ORAL_TABLET | Freq: Two times a day (BID) | ORAL | Status: DC
  Administered 2016-02-08 – 2016-02-14 (×11): 0.1 mg via ORAL
  Filled 2016-02-08 (×11): qty 1

## 2016-02-08 MED ORDER — BISACODYL 5 MG PO TBEC: 5.0000 mg | DELAYED_RELEASE_TABLET | Freq: Every day | ORAL | Status: DC | PRN

## 2016-02-08 MED ORDER — FENTANYL CITRATE (PF) 100 MCG/2ML IJ SOLN
12.5000 ug | INTRAMUSCULAR | Status: DC | PRN
  Administered 2016-02-08 – 2016-02-09 (×3): 12.5 ug via INTRAVENOUS
  Administered 2016-02-10 (×6): 50 ug via INTRAVENOUS
  Filled 2016-02-08 (×3): qty 2

## 2016-02-08 MED ORDER — FLUOXETINE HCL 20 MG PO CAPS
20.0000 mg | ORAL_CAPSULE | Freq: Every day | ORAL | Status: DC
  Administered 2016-02-08 – 2016-02-14 (×6): 20 mg via ORAL
  Filled 2016-02-08 (×6): qty 1

## 2016-02-08 MED ORDER — PANTOPRAZOLE SODIUM 40 MG PO TBEC
40.0000 mg | DELAYED_RELEASE_TABLET | Freq: Every day | ORAL | Status: DC
  Administered 2016-02-08 – 2016-02-14 (×6): 40 mg via ORAL
  Filled 2016-02-08 (×7): qty 1

## 2016-02-08 MED ORDER — FENOFIBRATE 54 MG PO TABS
54.0000 mg | ORAL_TABLET | Freq: Every day | ORAL | Status: DC
  Administered 2016-02-09 – 2016-02-14 (×5): 54 mg via ORAL
  Filled 2016-02-08 (×6): qty 1

## 2016-02-08 MED ORDER — OMEGA-3-ACID ETHYL ESTERS 1 G PO CAPS
1.0000 | ORAL_CAPSULE | Freq: Every day | ORAL | Status: DC
  Administered 2016-02-09 – 2016-02-14 (×5): 1 g via ORAL
  Filled 2016-02-08 (×6): qty 1

## 2016-02-08 MED ORDER — VITAMIN D 1000 UNITS PO TABS
2000.0000 [IU] | ORAL_TABLET | Freq: Every evening | ORAL | Status: DC
  Administered 2016-02-09 – 2016-02-13 (×5): 2000 [IU] via ORAL
  Filled 2016-02-08 (×6): qty 2

## 2016-02-08 MED ORDER — METOPROLOL SUCCINATE ER 100 MG PO TB24
100.0000 mg | ORAL_TABLET | Freq: Every day | ORAL | Status: DC
  Administered 2016-02-08 – 2016-02-14 (×6): 100 mg via ORAL
  Filled 2016-02-08 (×6): qty 1

## 2016-02-08 MED ORDER — AMLODIPINE BESYLATE 5 MG PO TABS
5.0000 mg | ORAL_TABLET | Freq: Every day | ORAL | Status: DC
  Administered 2016-02-08 – 2016-02-09 (×2): 5 mg via ORAL
  Filled 2016-02-08 (×2): qty 1

## 2016-02-08 MED ORDER — FENTANYL CITRATE (PF) 100 MCG/2ML IJ SOLN
50.0000 ug | Freq: Once | INTRAMUSCULAR | Status: AC
  Administered 2016-02-08: 50 ug via INTRAVENOUS
  Filled 2016-02-08: qty 2

## 2016-02-08 MED ORDER — MOMETASONE FURO-FORMOTEROL FUM 200-5 MCG/ACT IN AERO
2.0000 | INHALATION_SPRAY | Freq: Two times a day (BID) | RESPIRATORY_TRACT | Status: DC
  Administered 2016-02-09 – 2016-02-14 (×10): 2 via RESPIRATORY_TRACT
  Filled 2016-02-08: qty 8.8

## 2016-02-08 MED ORDER — INSULIN ASPART 100 UNIT/ML ~~LOC~~ SOLN
0.0000 [IU] | SUBCUTANEOUS | Status: DC
  Administered 2016-02-09: 1 [IU] via SUBCUTANEOUS
  Administered 2016-02-09 – 2016-02-10 (×3): 2 [IU] via SUBCUTANEOUS
  Administered 2016-02-10 (×3): 1 [IU] via SUBCUTANEOUS
  Administered 2016-02-10: 2 [IU] via SUBCUTANEOUS
  Administered 2016-02-11: 1 [IU] via SUBCUTANEOUS
  Administered 2016-02-11 (×2): 2 [IU] via SUBCUTANEOUS
  Administered 2016-02-11 (×2): 1 [IU] via SUBCUTANEOUS

## 2016-02-08 MED ORDER — ONDANSETRON HCL 4 MG PO TABS
4.0000 mg | ORAL_TABLET | Freq: Three times a day (TID) | ORAL | Status: DC | PRN
  Administered 2016-02-10: 4 mg via ORAL
  Filled 2016-02-08: qty 1

## 2016-02-08 MED ORDER — HYDROMORPHONE HCL 1 MG/ML IJ SOLN
1.0000 mg | Freq: Once | INTRAMUSCULAR | Status: AC
  Administered 2016-02-08: 1 mg via INTRAVENOUS
  Filled 2016-02-08: qty 1

## 2016-02-08 MED ORDER — HYDROCODONE-ACETAMINOPHEN 5-325 MG PO TABS
1.0000 | ORAL_TABLET | ORAL | Status: DC | PRN
  Administered 2016-02-08 – 2016-02-10 (×6): 1 via ORAL
  Filled 2016-02-08 (×6): qty 1

## 2016-02-08 MED ORDER — RESVERATROL 100 MG PO CAPS: 1.0000 | ORAL_CAPSULE | Freq: Every evening | ORAL | Status: DC

## 2016-02-08 MED ORDER — LEVOTHYROXINE SODIUM 75 MCG PO TABS
75.0000 ug | ORAL_TABLET | Freq: Every day | ORAL | Status: DC
  Administered 2016-02-09 – 2016-02-14 (×5): 75 ug via ORAL
  Filled 2016-02-08 (×5): qty 1

## 2016-02-08 MED ORDER — SULFAMETHOXAZOLE-TRIMETHOPRIM 400-80 MG PO TABS
1.0000 | ORAL_TABLET | ORAL | Status: DC
  Administered 2016-02-08: 1 via ORAL
  Filled 2016-02-08 (×2): qty 1

## 2016-02-08 MED ORDER — PREDNISONE 5 MG PO TABS
5.0000 mg | ORAL_TABLET | Freq: Every day | ORAL | Status: DC
  Administered 2016-02-09 – 2016-02-14 (×5): 5 mg via ORAL
  Filled 2016-02-08 (×5): qty 1

## 2016-02-08 MED ORDER — DARIFENACIN HYDROBROMIDE ER 7.5 MG PO TB24
7.5000 mg | ORAL_TABLET | Freq: Every morning | ORAL | Status: DC
  Administered 2016-02-09 – 2016-02-11 (×2): 7.5 mg via ORAL
  Filled 2016-02-08 (×3): qty 1

## 2016-02-08 MED ORDER — COLESEVELAM HCL 625 MG PO TABS
1875.0000 mg | ORAL_TABLET | Freq: Two times a day (BID) | ORAL | Status: DC
  Administered 2016-02-09 – 2016-02-14 (×10): 1875 mg via ORAL
  Filled 2016-02-08 (×13): qty 3

## 2016-02-08 MED ORDER — PHYTONADIONE 5 MG PO TABS
10.0000 mg | ORAL_TABLET | Freq: Once | ORAL | Status: AC
  Administered 2016-02-08: 10 mg via ORAL
  Filled 2016-02-08: qty 2

## 2016-02-08 MED ORDER — METHOCARBAMOL 500 MG PO TABS
500.0000 mg | ORAL_TABLET | Freq: Four times a day (QID) | ORAL | Status: DC | PRN
  Administered 2016-02-08 – 2016-02-14 (×2): 500 mg via ORAL
  Filled 2016-02-08 (×2): qty 1

## 2016-02-08 MED ORDER — METHOCARBAMOL 1000 MG/10ML IJ SOLN: 500.0000 mg | Freq: Four times a day (QID) | INTRAVENOUS | Status: DC | PRN

## 2016-02-08 MED ORDER — HYDRALAZINE HCL 20 MG/ML IJ SOLN
10.0000 mg | INTRAMUSCULAR | Status: DC | PRN
Start: 2016-02-08 — End: 2016-02-14

## 2016-02-08 MED ORDER — FUROSEMIDE 40 MG PO TABS
80.0000 mg | ORAL_TABLET | Freq: Every day | ORAL | Status: DC
  Administered 2016-02-09 – 2016-02-11 (×2): 80 mg via ORAL
  Filled 2016-02-08 (×2): qty 2

## 2016-02-08 NOTE — ED Provider Notes (Signed)
Patient seen and evaluated. Discussed at length with Dr. Rosana Hoes. Patient fell while attempting to walk to her walker. Has right hip pain.  Not shortened or rotated. X-ray shows subcapital femoral neck fracture. Patient is anticoagulated. We will discussed with orthopedics, and hospitalist  Tanna Furry, MD 02/08/16 [phone]

## 2016-02-08 NOTE — Progress Notes (Signed)
Per results review pt tested positive for ESBL on 05/21/2015. Per [facility] protocol pt to be placed on contact for the 12 months following a positive result. RN placed contact isolation orders per protocol. Nursing will continue to monitor.

## 2016-02-08 NOTE — Consult Note (Signed)
Patricia [name] is an 82 y.o. female.    Chief Complaint: right hip pain  HPI: 82 y/o female with a ground level fall earlier today injuring right hip. Pt tripped at home. She states she has a hx of balance issues and this is one reason for her fall. Denies any LOC, chest pain or other injuries than the right hip. Denies any numbness or tingling distally. Husband by the bedside.  PCP:  [name], MD  PMH: Past Medical History  Diagnosis Date  . Renal disorder   . Hx of kidney transplant     right  . Coronary artery disease   . Hypertension   . Thyroid disease   . Multiple allergies   . Abnormal gait   . Reflux   . Diverticulitis   . Hypothyroidism   . Depression   . Anxiety   . COPD, mild ([place])   . Dizziness   . Diabetes ([place])   . DVT (deep venous thrombosis) ([facility])     PSH: Past Surgical History  Procedure Laterality Date  . Nephrectomy transplanted organ    . Tumor removal      Brain     Social History:  reports that she has never smoked. She has never used smokeless tobacco. She reports that she does not drink alcohol or use illicit drugs.  Allergies:  Allergies  Allergen Reactions  . Macrolides And Ketolides Nausea Only  . Aricept [Donepezil Hcl] Nausea And Vomiting  . Codeine Nausea And Vomiting  . Penicillins Other (See Comments)    Has patient had a PCN reaction causing immediate rash, facial/tongue/throat swelling, SOB or lightheadedness with hypotension: No Has patient had a PCN reaction causing severe rash involving mucus membranes or skin necrosis: Yes Has patient had a PCN reaction that required hospitalization No Has patient had a PCN reaction occurring within the last 10 years: No If all of the above answers are "NO", then may proceed with Cephalosporin use.  . Statins Other (See Comments)    Doesn't remember  . Streptomycin Other (See Comments)    unknown  . Tetracyclines & Related Rash    Medications: Current  Facility-Administered Medications  Medication Dose Route Frequency Provider Last Rate Last Dose  . ALIGN CAPS 4 mg  4 mg Oral Daily Timothy S Opyd, MD      . amLODipine (NORVASC) tablet 5 mg  5 mg Oral Daily [name], MD      . azaTHIOprine (IMURAN) tablet 50 mg  50 mg Oral BID [name], MD      . bisacodyl (DULCOLAX) EC tablet 5 mg  5 mg Oral Daily PRN [name], MD      . cloNIDine (CATAPRES) tablet 0.1 mg  0.1 mg Oral BID [name], MD      . Derrill Memo ON 02/09/2016] colesevelam [facility]) tablet 1,875 mg  1,875 mg Oral BID WC Ilene Qua Opyd, MD      . cycloSPORINE modified (NEORAL) capsule 75 mg  75 mg Oral BID [name], MD      . Derrill Memo ON 02/09/2016] darifenacin (ENABLEX) 24 hr tablet 7.5 mg  7.5 mg Oral q morning - 10a Timothy S Opyd, MD      . fenofibrate tablet 54 mg  54 mg Oral Daily Timothy S Opyd, MD      . fentaNYL (SUBLIMAZE) injection 12.5 mcg  12.5 mcg Intravenous Q1H PRN [name], MD      . FLUoxetine (  PROZAC) tablet 20 mg  20 mg Oral Daily Timothy S Opyd, MD      . fluticasone (FLONASE) 50 MCG/ACT nasal spray 1 spray  1 spray Each Nare Daily PRN [name], MD      . furosemide (LASIX) tablet 40 mg  40 mg Oral QPM [name], MD      . Derrill Memo ON 02/09/2016] furosemide (LASIX) tablet 80 mg  80 mg Oral Daily Ilene Qua Opyd, MD      . hydrALAZINE (APRESOLINE) injection 10 mg  10 mg Intravenous Q4H PRN [name], MD      . hyoscyamine (ANASPAZ) disintergrating tablet 0.125 mg  0.125 mg Sublingual Q6H PRN Ilene Qua Opyd, MD      . insulin aspart (novoLOG) injection 0-9 Units  0-9 Units Subcutaneous Q4H Timothy S Opyd, MD      . ipratropium-albuterol (DUONEB) 0.5-2.5 (3) MG/3ML nebulizer solution 3 mL  3 mL Nebulization Q4H PRN [name], MD      . levothyroxine (SYNTHROID, LEVOTHROID) tablet 75 mcg  75 mcg Oral Daily Ilene Qua Opyd, MD      . loratadine (CLARITIN) tablet 10 mg  10 mg Oral Daily [name], MD      . methocarbamol  (ROBAXIN) tablet 500 mg  500 mg Oral Q6H PRN [name], MD       Or  . methocarbamol (ROBAXIN) 500 mg in dextrose 5 % 50 mL IVPB  500 mg Intravenous Q6H PRN [name], MD      . metoprolol succinate (TOPROL-XL) 24 hr tablet 100 mg  100 mg Oral Daily [name], MD      . mirabegron ER (MYRBETRIQ) tablet 25 mg  25 mg Oral QPM Timothy S Opyd, MD      . mometasone-formoterol (DULERA) 200-5 MCG/ACT inhaler 2 puff  2 puff Inhalation BID [name], MD      . multivitamin tablet 1 tablet  1 tablet Oral Daily Ilene Qua Opyd, MD      . omega-3 fish oil (MAXEPA) capsule 1,000 mg  1 capsule Oral Daily Ilene Qua Opyd, MD      . ondansetron (ZOFRAN) tablet 4 mg  4 mg Oral Q8H PRN [name], MD      . oxybutynin (DITROPAN-XL) 24 hr tablet 10 mg  10 mg Oral QHS Timothy S Opyd, MD      . pantoprazole (PROTONIX) EC tablet 40 mg  40 mg Oral Daily Timothy S Opyd, MD      . polyethylene glycol (MIRALAX / GLYCOLAX) packet 17 g  17 g Oral Daily PRN [name], MD      . Derrill Memo ON 02/09/2016] predniSONE (DELTASONE) tablet 5 mg  5 mg Oral Q breakfast Ilene Qua Opyd, MD      . Resveratrol CAPS 100 mg  1 capsule Oral QPM Ilene Qua Opyd, MD      . sulfamethoxazole-trimethoprim (BACTRIM,SEPTRA) 400-80 MG per tablet 1 tablet  1 tablet Oral Once per day on Mon Wed Fri [name], MD      . Vitamin D 2,000 Units  2,000 Units Oral QPM [name], MD       Current Outpatient Prescriptions  Medication Sig Dispense Refill  . albuterol (PROVENTIL HFA;VENTOLIN HFA) 108 (90 BASE) MCG/ACT inhaler Inhale 2 puffs into the lungs every 6 (six) hours as needed for wheezing or shortness of breath. Patient uses this medication prn.    Marland Kitchen  amLODipine (NORVASC) 5 MG tablet Take 5 mg by mouth daily.    Marland Kitchen azaTHIOprine (IMURAN) 50 MG tablet Take 50 mg by mouth 2 (two) times daily.     . budesonide-formoterol (SYMBICORT) 160-4.5 MCG/ACT inhaler Inhale 2 puffs into the lungs daily.     . cetirizine (ZYRTEC) 10 MG  tablet Take 10 mg by mouth every morning.     . Cholecalciferol (VITAMIN D) 2000 UNITS tablet Take 2,000 Units by mouth every evening.     . cloNIDine (CATAPRES) 0.1 MG tablet Take 0.1 mg by mouth 2 (two) times daily.    . Coenzyme Q10 (CO Q 10) 100 MG CAPS Take 1 tablet by mouth every evening.     . colesevelam (WELCHOL) 625 MG tablet Take 1,875 mg by mouth 2 (two) times daily with a meal.    . cycloSPORINE modified (NEORAL) 25 MG capsule Take 2 capsules (50 mg total) by mouth 2 (two) times daily. (Patient taking differently: Take 75 mg by mouth 2 (two) times daily. )    . ENABLEX 7.5 MG 24 hr tablet Take 7.5 mg by mouth every morning.     . fenofibrate (TRICOR) 48 MG tablet Take 48 mg by mouth daily.    Marland Kitchen FLUoxetine (PROZAC) 20 MG tablet Take 20 mg by mouth daily.    . fluticasone (FLONASE) 50 MCG/ACT nasal spray Place 1 spray into both nostrils daily as needed for allergies or rhinitis.    . fosfomycin (MONUROL) 3 G PACK Take 3 g by mouth once. To be taken on 08/22/15 1 packet 0  . furosemide (LASIX) 40 MG tablet Take 2 tablets (80 mg total) by mouth 2 (two) times daily. '80mg'$  in the morning and '40mg'$  in the evening 30 tablet   . hyoscyamine (ANASPAZ) 0.125 MG TBDP Place 0.125 mg under the tongue every 6 (six) hours as needed for bladder spasms. Patient uses this medication prn.    Marland Kitchen L-Lysine 500 MG TABS Take 500 mg by mouth daily.    Marland Kitchen levothyroxine (SYNTHROID, LEVOTHROID) 75 MCG tablet Take 75 mcg by mouth daily.    . metoprolol succinate (TOPROL-XL) 100 MG 24 hr tablet Take 100 mg by mouth daily. Take with or immediately following a meal.    . mirabegron ER (MYRBETRIQ) 25 MG TB24 tablet Take 25 mg by mouth every evening.     . Multiple Vitamin (MULTIVITAMIN) tablet Take 1 tablet by mouth daily.    Marland Kitchen omega-3 fish oil (MAXEPA) 1000 MG CAPS capsule Take 1 capsule by mouth daily.    Marland Kitchen omeprazole (PRILOSEC) 20 MG capsule Take 20 mg by mouth daily.     . ondansetron (ZOFRAN) 4 MG tablet Take 4 mg by  mouth every 8 (eight) hours as needed for nausea or vomiting.    Marland Kitchen oxybutynin (DITROPAN-XL) 10 MG 24 hr tablet Take 10 mg by mouth at bedtime.    . potassium chloride SA (K-DUR,KLOR-CON) 20 MEQ tablet Take 40 mEq by mouth daily.    . predniSONE (DELTASONE) 5 MG tablet Take 5 mg by mouth daily with breakfast.    . Probiotic Product (ALIGN) 4 MG CAPS Take 1 tablet by mouth daily.    Marland Kitchen Resveratrol 100 MG CAPS Take 1 capsule by mouth every evening.     . sulfamethoxazole-trimethoprim (BACTRIM,SEPTRA) 400-80 MG per tablet Take 1 tablet by mouth 3 (three) times a week. Monday, Wednesday, and Friday.    . warfarin (COUMADIN) 3 MG tablet Take 3 mg by mouth daily. Tues,Thurs,Sat,Sun    .  warfarin (COUMADIN) 5 MG tablet Alternate 2.5 mg and 5 mg daily. (Patient not taking: Reported on 08/20/2015) 30 tablet 0  . warfarin (COUMADIN) 5 MG tablet Take 5 mg by mouth daily.      Results for orders placed or performed during the hospital encounter of 02/08/16 (from the past 48 hour(s))  Protime-INR     Status: Abnormal   Collection Time: 02/08/16  4:50 PM  Result Value Ref Range   Prothrombin Time 24.1 (H) 11.6 - 15.2 seconds   INR 2.18 (H) 0.00 - 2.53  Basic metabolic panel     Status: Abnormal   Collection Time: 02/08/16  4:50 PM  Result Value Ref Range   Sodium 142 135 - 145 mmol/L   Potassium 4.2 3.5 - 5.1 mmol/L   Chloride 109 101 - 111 mmol/L   CO2 21 (L) 22 - 32 mmol/L   Glucose, Bld 147 (H) 65 - 99 mg/dL   BUN 31 (H) 6 - 20 mg/dL   Creatinine, Ser 1.92 (H) 0.44 - 1.00 mg/dL   Calcium 10.0 8.9 - 10.3 mg/dL   GFR calc non Af Amer 23 (L) >60 mL/min   GFR calc Af Amer 27 (L) >60 mL/min    Comment: (NOTE) The eGFR has been calculated using the CKD EPI equation. This calculation has not been validated in all clinical situations. eGFR's persistently <60 mL/min signify possible Chronic Kidney Disease.    Anion gap 12 5 - 15  CBC with Differential     Status: Abnormal   Collection Time: 02/08/16   4:50 PM  Result Value Ref Range   WBC 7.9 4.0 - 10.5 K/uL   RBC 4.23 3.87 - 5.11 MIL/uL   Hemoglobin 12.8 12.0 - 15.0 g/dL   HCT 38.8 36.0 - 46.0 %   MCV 91.7 78.0 - 100.0 fL   MCH 30.3 26.0 - 34.0 pg   MCHC 33.0 30.0 - 36.0 g/dL   RDW 15.7 (H) 11.5 - 15.5 %   Platelets 219 150 - 400 K/uL   Neutrophils Relative % 74 %   Neutro Abs 5.8 1.7 - 7.7 K/uL   Lymphocytes Relative 21 %   Lymphs Abs 1.7 0.7 - 4.0 K/uL   Monocytes Relative 5 %   Monocytes Absolute 0.4 0.1 - 1.0 K/uL   Eosinophils Relative 0 %   Eosinophils Absolute 0.0 0.0 - 0.7 K/uL   Basophils Relative 0 %   Basophils Absolute 0.0 0.0 - 0.1 K/uL  I-stat Chem 8, ED     Status: Abnormal   Collection Time: 02/08/16  5:02 PM  Result Value Ref Range   Sodium 141 135 - 145 mmol/L   Potassium 4.1 3.5 - 5.1 mmol/L   Chloride 109 101 - 111 mmol/L   BUN 29 (H) 6 - 20 mg/dL   Creatinine, Ser 2.00 (H) 0.44 - 1.00 mg/dL   Glucose, Bld 138 (H) 65 - 99 mg/dL   Calcium, Ion 1.12 (L) 1.13 - 1.30 mmol/L   TCO2 19 0 - 100 mmol/L   Hemoglobin 14.3 12.0 - 15.0 g/dL   HCT 42.0 36.0 - 46.0 %   Dg Chest 1 View  02/08/2016  CLINICAL DATA:  Per EMS_ pt stood to go to the bathroom with her walker and lost her balance today. Pt noted to have shortening and rotation to right leg. No chest complaints. EXAM: CHEST 1 VIEW COMPARISON:  05/26/2015 FINDINGS: Normal cardiac silhouette. Calcified granulomas noted. No effusion, infiltrate pneumothorax. No acute osseous abnormality.  IMPRESSION: No acute cardiopulmonary process. Electronically Signed   By: Suzy Bouchard M.D.   On: 02/08/2016 17:35   Dg Pelvis 1-2 Views  02/08/2016  CLINICAL DATA:  Right leg shortening after fall today. EXAM: PELVIS - 1-2 VIEW COMPARISON:  None. FINDINGS: Moderately angulated fracture involving the right femoral neck is noted. Left hip appears normal. Sacroiliac joints appear normal. IMPRESSION: Moderately angulated right femoral neck fracture. Electronically Signed   By:  Marijo Conception, M.D.   On: 02/08/2016 17:39   Ct Head Wo Contrast  02/08/2016  CLINICAL DATA:  Dizziness, head laceration after fall. No loss of consciousness. EXAM: CT HEAD WITHOUT CONTRAST TECHNIQUE: Contiguous axial images were obtained from the base of the skull through the vertex without intravenous contrast. COMPARISON:  CT scan of April 23, 2012. FINDINGS: Status post left occipital craniotomy. Minimal diffuse cortical atrophy is noted. Mild chronic ischemic white matter disease is noted. Stable 2.2 cm hyperdense mass is seen in left posterior fossa. Stable encephalomalacia of left cerebellar hemisphere is noted. There is no evidence of hemorrhage or acute infarction. Ventricular size is within normal limits. No mass effect or midline shift is noted. IMPRESSION: Stable postoperative changes are seen involving the left occipital skull and left cerebellar hemisphere with associated 2.2 cm hyperdense mass most consistent with meningioma. No significant changes noted compared to prior exam. Electronically Signed   By: Marijo Conception, M.D.   On: 02/08/2016 18:35   Dg Femur, Min 2 Views Right  02/08/2016  CLINICAL DATA:  Initial encounter for Per EMS_ pt stood to go to the bathroom with her walker and lost her balance today. Pt noted to have shortening and rotation to right leg. No chest complaints. EXAM: RIGHT FEMUR 2 VIEWS COMPARISON:  Pelvic films of earlier today FINDINGS: Osteopenia. varus angulation involving mid femoral neck fracture. No dislocation. distal femur intact, with joint space narrowing involving the knee. No knee joint effusion. Artifact degradation about the distal femur. IMPRESSION: Mid femoral neck fracture, as before. Electronically Signed   By: Abigail Miyamoto M.D.   On: 02/08/2016 17:48    ROS: ROS Has balance issues by her report Lives at home with husband  Physical Exam: Alert and appropriate 82 y/o female in no acute distress Cervical spine with full rom and no  tenderness Bilateral upper extremities with full rom, no tenderness, nv intact distally Left lower extremity full rom no tenderness Right lower extremity with shortening and external rotation No signs of open fracture nv intact distally Not taken thru any rom with the right lower extremity No rashes  Mild edema distally Physical Exam   Assessment/Plan Assessment: right femur fracture  Plan: Pt to be admitted to medical team. Once INR improves and she has been cleared recommend surgery to provide pain relief and start recovery. Plan for surgery tomorrow vs Friday She may eat tonight but NPO after midnight.  Pain management as needed Strict bedrest with foley

## 2016-02-08 NOTE — ED Notes (Signed)
Ortho MD at bedside.

## 2016-02-08 NOTE — H&P (Signed)
History and Physical    Patricia [name] [id number] DOB: 1932/03/14 DOA: 02/08/2016  Referring Provider: Dr. Jeneen Rinks ([place])  PCP: [name], MD  Outpatient Specialists:  Dr. Krista Blue (neurology)   Patient coming from: Home   Chief Complaint: Fall with right hip and occipital pain   HPI: Patricia [name] is a 82 y.o. female with medical history significant for renal transplant with CKD 3, DVT, COPD, recurrent UTIs, and chronic diastolic CHF who presents to the ED with right hip and occipital head pain following a mechanical fall at home. Patient reports that she was in her usual state of health with no recent fevers, chills, lightheadedness, or dizziness when she suffered a mechanical fall at home today, striking the back of her head on the ground. Patient is anticoagulated with Coumadin for an unprovoked DVT. She has gait problems that have been evaluated by neurology and attributed to arthritis and resulting pain. She has been using a walker and was walking several steps without the walker today when she fell backwards. There was no loss of consciousness, no nausea or vomiting, and no confusion, focal numbness or weakness, or loss of coordination. Prior to the fall, she was in her usual state of health.  ED Course: Upon arrival to the ED, patient is found to be saturating well on room air and with vital signs stable. Head CT was obtained and negative for acute intracranial abnormality. EKG demonstrates an arrhythmia, possibly atrial fibrillation. Chest x-ray is negative for acute cardiopulmonary disease but radiographs of the hip demonstrate a moderately angulated right femoral neck fracture. BMP is notable for serum creatinine of 1.92, up from 1.22 to at last check in August 2016. CBC is unremarkable and INR is 2.18. Patient was given fentanyl and Dilaudid for pain in the emergency department and 10 mg of vitamin K were administered. Orthopedic surgery was consulted by the EDP and admission to  the medicine service was advised.  Review of Systems:  All other systems reviewed and apart from HPI, are negative.  Past Medical History  Diagnosis Date  . Renal disorder   . Hx of kidney transplant     right  . Coronary artery disease   . Hypertension   . Thyroid disease   . Multiple allergies   . Abnormal gait   . Reflux   . Diverticulitis   . Hypothyroidism   . Depression   . Anxiety   . COPD, mild ([place])   . Dizziness   . Diabetes ([place])   . DVT (deep venous thrombosis) [facility])     Past Surgical History  Procedure Laterality Date  . Nephrectomy transplanted organ    . Tumor removal      Brain      reports that she has never smoked. She has never used smokeless tobacco. She reports that she does not drink alcohol or use illicit drugs.  Allergies  Allergen Reactions  . Macrolides And Ketolides Nausea Only  . Aricept [Donepezil Hcl] Nausea And Vomiting  . Codeine Nausea And Vomiting  . Penicillins Other (See Comments)    Has patient had a PCN reaction causing immediate rash, facial/tongue/throat swelling, SOB or lightheadedness with hypotension:  Has patient had a PCN reaction causing severe rash involving mucus membranes or skin necrosis:  Has patient had a PCN reaction that required hospitalization  Has patient had a PCN reaction occurring within the last 10 years:  If all of the above answers are "NO", then may proceed with Cephalosporin use.  Marland Kitchen  Statins Other (See Comments)    Doesn't remember  . Streptomycin Other (See Comments)    unknown  . Tetracyclines & Related Rash    Family History  Problem Relation Age of Onset  . Alzheimer's disease Mother   . Alcoholism Father   . High Cholesterol Mother      Prior to Admission medications   Medication Sig Start Date End Date Taking? Authorizing Provider  albuterol (PROVENTIL HFA;VENTOLIN HFA) 108 (90 BASE) MCG/ACT inhaler Inhale 2 puffs into the lungs every 6 (six) hours as needed for wheezing or shortness  of breath. Patient uses this medication prn.    Historical Provider, MD  amLODipine (NORVASC) 5 MG tablet Take 5 mg by mouth daily. 06/25/13   Historical Provider, MD  azaTHIOprine (IMURAN) 50 MG tablet Take 50 mg by mouth 2 (two) times daily.     Historical Provider, MD  budesonide-formoterol (SYMBICORT) 160-4.5 MCG/ACT inhaler Inhale 2 puffs into the lungs daily.     Historical Provider, MD  cetirizine (ZYRTEC) 10 MG tablet Take 10 mg by mouth every morning.     Historical Provider, MD  Cholecalciferol (VITAMIN D) 2000 UNITS tablet Take 2,000 Units by mouth every evening.     Historical Provider, MD  cloNIDine (CATAPRES) 0.1 MG tablet Take 0.1 mg by mouth 2 (two) times daily.    Historical Provider, MD  Coenzyme Q10 (CO Q 10) 100 MG CAPS Take 1 tablet by mouth every evening.     Historical Provider, MD  colesevelam (WELCHOL) 625 MG tablet Take 1,875 mg by mouth 2 (two) times daily with a meal.    Historical Provider, MD  cycloSPORINE modified (NEORAL) 25 MG capsule Take 2 capsules (50 mg total) by mouth 2 (two) times daily. Patient taking differently: Take 75 mg by mouth 2 (two) times daily.  05/27/15   Delfina Redwood, MD  ENABLEX 7.5 MG 24 hr tablet Take 7.5 mg by mouth every morning.  06/16/13   Historical Provider, MD  fenofibrate (TRICOR) 48 MG tablet Take 48 mg by mouth daily.    Historical Provider, MD  FLUoxetine (PROZAC) 20 MG tablet Take 20 mg by mouth daily.    Historical Provider, MD  fluticasone (FLONASE) 50 MCG/ACT nasal spray Place 1 spray into both nostrils daily as needed for allergies or rhinitis.    Historical Provider, MD  fosfomycin (MONUROL) 3 G PACK Take 3 g by mouth once. To be taken on 08/22/15 08/22/15   Mercedes Camprubi-Soms, PA-C  furosemide (LASIX) 40 MG tablet Take 2 tablets (80 mg total) by mouth 2 (two) times daily. 80mg  in the morning and 40mg  in the evening 05/27/15   Delfina Redwood, MD  hyoscyamine (ANASPAZ) 0.125 MG TBDP Place 0.125 mg under the tongue every 6  (six) hours as needed for bladder spasms. Patient uses this medication prn.    Historical Provider, MD  L-Lysine 500 MG TABS Take 500 mg by mouth daily.    Historical Provider, MD  levothyroxine (SYNTHROID, LEVOTHROID) 75 MCG tablet Take 75 mcg by mouth daily.    Historical Provider, MD  metoprolol succinate (TOPROL-XL) 100 MG 24 hr tablet Take 100 mg by mouth daily. Take with or immediately following a meal.    Historical Provider, MD  mirabegron ER (MYRBETRIQ) 25 MG TB24 tablet Take 25 mg by mouth every evening.     Historical Provider, MD  Multiple Vitamin (MULTIVITAMIN) tablet Take 1 tablet by mouth daily.    Historical Provider, MD  omega-3 fish oil (  MAXEPA) 1000 MG CAPS capsule Take 1 capsule by mouth daily.    Historical Provider, MD  omeprazole (PRILOSEC) 20 MG capsule Take 20 mg by mouth daily.     Historical Provider, MD  ondansetron (ZOFRAN) 4 MG tablet Take 4 mg by mouth every 8 (eight) hours as needed for nausea or vomiting.    Historical Provider, MD  oxybutynin (DITROPAN-XL) 10 MG 24 hr tablet Take 10 mg by mouth at bedtime.    Historical Provider, MD  potassium chloride SA (K-DUR,KLOR-CON) 20 MEQ tablet Take 40 mEq by mouth daily.    Historical Provider, MD  predniSONE (DELTASONE) 5 MG tablet Take 5 mg by mouth daily with breakfast.    Historical Provider, MD  Probiotic Product (ALIGN) 4 MG CAPS Take 1 tablet by mouth daily.    Historical Provider, MD  Resveratrol 100 MG CAPS Take 1 capsule by mouth every evening.     Historical Provider, MD  sulfamethoxazole-trimethoprim (BACTRIM,SEPTRA) 400-80 MG per tablet Take 1 tablet by mouth 3 (three) times a week. Monday, Wednesday, and Friday.    Historical Provider, MD  warfarin (COUMADIN) 3 MG tablet Take 3 mg by mouth daily. Tues,Thurs,Sat,Sun    Historical Provider, MD  warfarin (COUMADIN) 5 MG tablet Alternate 2.5 mg and 5 mg daily. Patient not taking: Reported on 08/20/2015 05/27/15   Delfina Redwood, MD  warfarin (COUMADIN) 5 MG  tablet Take 5 mg by mouth daily.    Historical Provider, MD    Physical Exam: Filed Vitals:   02/08/16 1630 02/08/16 1645 02/08/16 1800  BP: 164/80 178/79 165/77  Pulse: 65 65 74  Resp: 21 15 20   SpO2: 99% 99% 99%      Constitutional: NAD, calm, comfortable Eyes: PERTLA, lids and conjunctivae normal ENMT: Mucous membranes are moist. Posterior pharynx clear of any exudate or lesions.    Neck: normal, supple, no masses, no thyromegaly Respiratory: clear to auscultation bilaterally, no wheezing, no crackles. Normal respiratory effort. No accessory muscle use.  Cardiovascular: S1 & S2 heard, regular rate and rhythm, no murmurs / rubs / gallops. 2+ pedal pulses. No carotid bruits.  Abdomen: No distension, no tenderness, no masses palpated.  Bowel sounds normal.  Musculoskeletal: no clubbing / cyanosis. Right hip slight deformity, right leg neurovascularly intact Skin: no rashes, lesions, ulcers. No induration Neurologic: CN 2-12 grossly intact. Sensation intact, DTR normal. Strength 5/5 in all 4 limbs.  Psychiatric: Normal judgment and insight. Alert and oriented x 3. Normal mood.     Labs on Admission: I have personally reviewed following labs and imaging studies  CBC:  Recent Labs Lab 02/08/16 1650 02/08/16 1702  WBC 7.9  --   NEUTROABS 5.8  --   HGB 12.8 14.3  HCT 38.8 42.0  MCV 91.7  --   PLT 219  --    Basic Metabolic Panel:  Recent Labs Lab 02/08/16 1650 02/08/16 1702  NA 142 141  K 4.2 4.1  CL 109 109  CO2 21*  --   GLUCOSE 147* 138*  BUN 31* 29*  CREATININE 1.92* 2.00*  CALCIUM 10.0  --    GFR: CrCl cannot be calculated (Unknown ideal weight.). Liver Function Tests: No results for input(s): AST, ALT, ALKPHOS, BILITOT, PROT, ALBUMIN in the last 168 hours. No results for input(s): LIPASE, AMYLASE in the last 168 hours. No results for input(s): AMMONIA in the last 168 hours. Coagulation Profile:  Recent Labs Lab 02/08/16 1650  INR 2.18*   Cardiac  Enzymes: No results  for input(s): CKTOTAL, CKMB, CKMBINDEX, TROPONINI in the last 168 hours. BNP (last 3 results) No results for input(s): PROBNP in the last 8760 hours. HbA1C: No results for input(s): HGBA1C in the last 72 hours. CBG: No results for input(s): GLUCAP in the last 168 hours. Lipid Profile: No results for input(s): CHOL, HDL, LDLCALC, TRIG, CHOLHDL, LDLDIRECT in the last 72 hours. Thyroid Function Tests: No results for input(s): TSH, T4TOTAL, FREET4, T3FREE, THYROIDAB in the last 72 hours. Anemia Panel: No results for input(s): VITAMINB12, FOLATE, FERRITIN, TIBC, IRON, RETICCTPCT in the last 72 hours. Urine analysis:    Component Value Date/Time   COLORURINE YELLOW 08/20/2015 1310   APPEARANCEUR TURBID* 08/20/2015 1310   LABSPEC 1.015 08/20/2015 1310   PHURINE 5.5 08/20/2015 1310   GLUCOSEU NEGATIVE 08/20/2015 1310   HGBUR MODERATE* 08/20/2015 1310   BILIRUBINUR NEGATIVE 08/20/2015 1310   KETONESUR NEGATIVE 08/20/2015 1310   PROTEINUR 100* 08/20/2015 1310   UROBILINOGEN 0.2 08/20/2015 1310   NITRITE POSITIVE* 08/20/2015 1310   LEUKOCYTESUR LARGE* 08/20/2015 1310   Sepsis Labs: @LABRCNTIP (procalcitonin:4,lacticidven:4) )No results found for this or any previous visit (from the past 240 hour(s)).   Radiological Exams on Admission: Dg Chest 1 View  02/08/2016  CLINICAL DATA:  Per EMS_ pt stood to go to the bathroom with her walker and lost her balance today. Pt noted to have shortening and rotation to right leg. No chest complaints. EXAM: CHEST 1 VIEW COMPARISON:  05/26/2015 FINDINGS: Normal cardiac silhouette. Calcified granulomas noted. No effusion, infiltrate pneumothorax. No acute osseous abnormality. IMPRESSION: No acute cardiopulmonary process. Electronically Signed   By: Suzy Bouchard M.D.   On: 02/08/2016 17:35   Dg Pelvis 1-2 Views  02/08/2016  CLINICAL DATA:  Right leg shortening after fall today. EXAM: PELVIS - 1-2 VIEW COMPARISON:  None. FINDINGS:  Moderately angulated fracture involving the right femoral neck is noted. Left hip appears normal. Sacroiliac joints appear normal. IMPRESSION: Moderately angulated right femoral neck fracture. Electronically Signed   By: Marijo Conception, M.D.   On: 02/08/2016 17:39   Ct Head Wo Contrast  02/08/2016  CLINICAL DATA:  Dizziness, head laceration after fall. No loss of consciousness. EXAM: CT HEAD WITHOUT CONTRAST TECHNIQUE: Contiguous axial images were obtained from the base of the skull through the vertex without intravenous contrast. COMPARISON:  CT scan of April 23, 2012. FINDINGS: Status post left occipital craniotomy. Minimal diffuse cortical atrophy is noted. Mild chronic ischemic white matter disease is noted. Stable 2.2 cm hyperdense mass is seen in left posterior fossa. Stable encephalomalacia of left cerebellar hemisphere is noted. There is no evidence of hemorrhage or acute infarction. Ventricular size is within normal limits. No mass effect or midline shift is noted. IMPRESSION: Stable postoperative changes are seen involving the left occipital skull and left cerebellar hemisphere with associated 2.2 cm hyperdense mass most consistent with meningioma. No significant changes noted compared to prior exam. Electronically Signed   By: Marijo Conception, M.D.   On: 02/08/2016 18:35   Dg Femur, Min 2 Views Right  02/08/2016  CLINICAL DATA:  Initial encounter for Per EMS_ pt stood to go to the bathroom with her walker and lost her balance today. Pt noted to have shortening and rotation to right leg. No chest complaints. EXAM: RIGHT FEMUR 2 VIEWS COMPARISON:  Pelvic films of earlier today FINDINGS: Osteopenia. varus angulation involving mid femoral neck fracture. No dislocation. distal femur intact, with joint space narrowing involving the knee. No knee joint effusion. Artifact degradation  about the distal femur. IMPRESSION: Mid femoral neck fracture, as before. Electronically Signed   By: Abigail Miyamoto M.D.   On:  02/08/2016 17:48    EKG: Independently reviewed. Atrial arrhythmia; now appears to be in sinus on telemetry, repeating EKG   Assessment/Plan  1. Right hip fracture  - Suffered mechanical, ground-level fall at home  - Radiographs reveal moderately angulated right femoral neck fracture  - Orthopedics consulted by EDP and planning tentatively for ORIF 4/27 or 02/10/16  - Type and screen has been performed  - Warfarin is being held, 10 mg vit K given, will repeat INR in am  - EKG with atrial arrhythmia as below, tele now showing apparent sinus, repeat EKG and troponin pending  - BP and sugars will be controlled   - Pain-control prn   2. CKD stage III-IV, s/p renal transplant  - Was on HD from 2005 to 2010 before receiving [facility] at [facility] in 2010  - SCr has risen since transplant, now 1.92 on admission, up from 1.22 in August 2016  - Continue current-dose Imuran, cyclosporine, and prednisone    3. Cardiac arrhythmia  - Initial EKG suggestive of atrial arrhythmia with abnormal T-waves in aVL, which she does not appear to have history of  - Telemetry appears to show a sinus rhythm  - There is no CP, SOB, or palpitations - Repeat EKG requested, pending  - Will check troponins overnight given new Tw abnormality   4. Chronic diastolic CHF - TTE ([id number]) with EF [id number], grade 2 diastolic dysfunction, mild MR, and mild increase in PA pressure - Appears euvolemic on presentation  - SLIV, daily wts, strict I/Os, fluid-restrict diet  - Continue home-dose Lasix 80 qAM, and 40 mg qPM   5. COPD - Appears stable, saturating well on rm air, no wheezes - Continue inhaled LABA/ICS with Dulera  - DuoNebs prn    6. Hypothyroidism  - Appears to be stable  - Continue current-dose Synthroid 75 mcg qD   7. Recurrent UTIs - Given planned surgery and recurrent UTI, ESBL, will check UA - Continue Bactrim suppressive therapy   8. Anxiety, depression  - Appears stable - Continue  current-dose Prozac   DVT prophylaxis: On coumadin  Code Status: Full  Family Communication: None available  Disposition Plan: Admit to telemetry  Consults called: Orthopedic surgery  Admission status: Inpatient     [name] MD [facility] Pager [phone]  If 7PM-7AM, please contact night-coverage [email or web address] Password [facility]  02/08/2016, 7:03 PM

## 2016-02-08 NOTE — ED Notes (Signed)
Patient transported to CT 

## 2016-02-08 NOTE — ED Provider Notes (Signed)
CSN: [id number]     Arrival date & time 02/08/16  1616 History   First MD Initiated Contact with Patient 02/08/16 1620     Chief Complaint  Patient presents with  . Fall     (Consider location/radiation/quality/duration/timing/severity/associated sxs/prior Treatment) HPI This is an 82 year old female presents following a mechanical fall. She was trying to sit down in a chair with wheels, and the chair rolled away from her while she was attempting to sit down. She fell forward, landing on her right hip, and striking her head on the ground. She has a history of kidney transplant, is anticoagulated on Coumadin, and has a history of heart failure. She complains of severe pain in her right hip, had has been unable to bear weight on it since falling. She denies any numbness in her leg.  When she fell she hit her head, she denies any loss of consciousness. She does complain of a mild headache, but denies blurry vision, nausea or vomiting.   Past Medical History  Diagnosis Date  . Renal disorder   . Hx of kidney transplant     right  . Coronary artery disease   . Hypertension   . Thyroid disease   . Multiple allergies   . Abnormal gait   . Reflux   . Diverticulitis   . Hypothyroidism   . Depression   . Anxiety   . COPD, mild ([place])   . Dizziness   . Diabetes ([place])   . DVT (deep venous thrombosis) [facility])    Past Surgical History  Procedure Laterality Date  . Nephrectomy transplanted organ    . Tumor removal      Brain    Family History  Problem Relation Age of Onset  . Alzheimer's disease Mother   . Alcoholism Father   . High Cholesterol Mother    Social History  Substance Use Topics  . Smoking status: Never Smoker   . Smokeless tobacco: Never Used  . Alcohol Use: No   OB History    No data available     Review of Systems  Eyes: Negative for photophobia and visual disturbance.  Musculoskeletal: Positive for joint swelling, arthralgias, gait problem and neck pain.  Negative for back pain and neck stiffness.  Neurological: Positive for tremors (Chronic). Negative for facial asymmetry, weakness and headaches.  All other systems reviewed and are negative.     Allergies  Macrolides and ketolides; Aricept; Codeine; Penicillins; Statins; Streptomycin; and Tetracyclines & related  Home Medications   Prior to Admission medications   Medication Sig Start Date End Date Taking? Authorizing Provider  albuterol (PROVENTIL HFA;VENTOLIN HFA) 108 (90 BASE) MCG/ACT inhaler Inhale 2 puffs into the lungs every 6 (six) hours as needed for wheezing or shortness of breath. Patient uses this medication prn.    Historical Provider, MD  amLODipine (NORVASC) 5 MG tablet Take 5 mg by mouth daily. 06/25/13   Historical Provider, MD  azaTHIOprine (IMURAN) 50 MG tablet Take 50 mg by mouth 2 (two) times daily.     Historical Provider, MD  budesonide-formoterol (SYMBICORT) 160-4.5 MCG/ACT inhaler Inhale 2 puffs into the lungs daily.     Historical Provider, MD  cetirizine (ZYRTEC) 10 MG tablet Take 10 mg by mouth every morning.     Historical Provider, MD  Cholecalciferol (VITAMIN D) 2000 UNITS tablet Take 2,000 Units by mouth every evening.     Historical Provider, MD  cloNIDine (CATAPRES) 0.1 MG tablet Take 0.1 mg by mouth 2 (two) times daily.  Historical Provider, MD  Coenzyme Q10 (CO Q 10) 100 MG CAPS Take 1 tablet by mouth every evening.     Historical Provider, MD  colesevelam (WELCHOL) 625 MG tablet Take 1,875 mg by mouth 2 (two) times daily with a meal.    Historical Provider, MD  cycloSPORINE modified (NEORAL) 25 MG capsule Take 2 capsules (50 mg total) by mouth 2 (two) times daily. Patient taking differently: Take 75 mg by mouth 2 (two) times daily.  05/27/15   Delfina Redwood, MD  ENABLEX 7.5 MG 24 hr tablet Take 7.5 mg by mouth every morning.  06/16/13   Historical Provider, MD  fenofibrate (TRICOR) 48 MG tablet Take 48 mg by mouth daily.    Historical Provider, MD   FLUoxetine (PROZAC) 20 MG tablet Take 20 mg by mouth daily.    Historical Provider, MD  fluticasone (FLONASE) 50 MCG/ACT nasal spray Place 1 spray into both nostrils daily as needed for allergies or rhinitis.    Historical Provider, MD  fosfomycin (MONUROL) 3 G PACK Take 3 g by mouth once. To be taken on 08/22/15 08/22/15   Mercedes Camprubi-Soms, PA-C  furosemide (LASIX) 40 MG tablet Take 2 tablets (80 mg total) by mouth 2 (two) times daily. 80mg  in the morning and 40mg  in the evening 05/27/15   Delfina Redwood, MD  hyoscyamine (ANASPAZ) 0.125 MG TBDP Place 0.125 mg under the tongue every 6 (six) hours as needed for bladder spasms. Patient uses this medication prn.    Historical Provider, MD  L-Lysine 500 MG TABS Take 500 mg by mouth daily.    Historical Provider, MD  levothyroxine (SYNTHROID, LEVOTHROID) 75 MCG tablet Take 75 mcg by mouth daily.    Historical Provider, MD  metoprolol succinate (TOPROL-XL) 100 MG 24 hr tablet Take 100 mg by mouth daily. Take with or immediately following a meal.    Historical Provider, MD  mirabegron ER (MYRBETRIQ) 25 MG TB24 tablet Take 25 mg by mouth every evening.     Historical Provider, MD  Multiple Vitamin (MULTIVITAMIN) tablet Take 1 tablet by mouth daily.    Historical Provider, MD  omega-3 fish oil (MAXEPA) 1000 MG CAPS capsule Take 1 capsule by mouth daily.    Historical Provider, MD  omeprazole (PRILOSEC) 20 MG capsule Take 20 mg by mouth daily.     Historical Provider, MD  ondansetron (ZOFRAN) 4 MG tablet Take 4 mg by mouth every 8 (eight) hours as needed for nausea or vomiting.    Historical Provider, MD  oxybutynin (DITROPAN-XL) 10 MG 24 hr tablet Take 10 mg by mouth at bedtime.    Historical Provider, MD  potassium chloride SA (K-DUR,KLOR-CON) 20 MEQ tablet Take 40 mEq by mouth daily.    Historical Provider, MD  predniSONE (DELTASONE) 5 MG tablet Take 5 mg by mouth daily with breakfast.    Historical Provider, MD  Probiotic Product (ALIGN) 4 MG CAPS  Take 1 tablet by mouth daily.    Historical Provider, MD  Resveratrol 100 MG CAPS Take 1 capsule by mouth every evening.     Historical Provider, MD  sulfamethoxazole-trimethoprim (BACTRIM,SEPTRA) 400-80 MG per tablet Take 1 tablet by mouth 3 (three) times a week. Monday, Wednesday, and Friday.    Historical Provider, MD  warfarin (COUMADIN) 3 MG tablet Take 3 mg by mouth daily. Tues,Thurs,Sat,Sun    Historical Provider, MD  warfarin (COUMADIN) 5 MG tablet Alternate 2.5 mg and 5 mg daily. Patient not taking: Reported on 08/20/2015 05/27/15  Delfina Redwood, MD  warfarin (COUMADIN) 5 MG tablet Take 5 mg by mouth daily.    Historical Provider, MD   BP 165/77 mmHg  Pulse 74  Resp 20  SpO2 99% Physical Exam  Constitutional:  Chronically ill-appearing  Neck: No JVD present.  Cardiovascular: Normal rate.  An irregularly irregular rhythm present.  Murmur heard. Pulmonary/Chest: No respiratory distress. She has no wheezes.  Abdominal: She exhibits no distension. There is no tenderness.  Graft kidney palpable in the right lower quadrant, no tenderness  Musculoskeletal:       Right hip: She exhibits tenderness, bony tenderness and deformity.       Right ankle: She exhibits normal pulse.  Vitals reviewed.   ED Course  Procedures (including critical care time) Labs Review Labs Reviewed  PROTIME-INR - Abnormal; Notable for the following:    Prothrombin Time 24.1 (*)    INR 2.18 (*)    All other components within normal limits  BASIC METABOLIC PANEL - Abnormal; Notable for the following:    CO2 21 (*)    Glucose, Bld 147 (*)    BUN 31 (*)    Creatinine, Ser 1.92 (*)    GFR calc non Af Amer 23 (*)    GFR calc Af Amer 27 (*)    All other components within normal limits  CBC WITH DIFFERENTIAL/PLATELET - Abnormal; Notable for the following:    RDW 15.7 (*)    All other components within normal limits  I-STAT CHEM 8, ED - Abnormal; Notable for the following:    BUN 29 (*)     Creatinine, Ser 2.00 (*)    Glucose, Bld 138 (*)    Calcium, Ion 1.12 (*)    All other components within normal limits    Imaging Review Dg Chest 1 View  02/08/2016  CLINICAL DATA:  Per EMS_ pt stood to go to the bathroom with her walker and lost her balance today. Pt noted to have shortening and rotation to right leg. No chest complaints. EXAM: CHEST 1 VIEW COMPARISON:  05/26/2015 FINDINGS: Normal cardiac silhouette. Calcified granulomas noted. No effusion, infiltrate pneumothorax. No acute osseous abnormality. IMPRESSION: No acute cardiopulmonary process. Electronically Signed   By: Suzy Bouchard M.D.   On: 02/08/2016 17:35   Dg Pelvis 1-2 Views  02/08/2016  CLINICAL DATA:  Right leg shortening after fall today. EXAM: PELVIS - 1-2 VIEW COMPARISON:  None. FINDINGS: Moderately angulated fracture involving the right femoral neck is noted. Left hip appears normal. Sacroiliac joints appear normal. IMPRESSION: Moderately angulated right femoral neck fracture. Electronically Signed   By: Marijo Conception, M.D.   On: 02/08/2016 17:39   Dg Femur, Min 2 Views Right  02/08/2016  CLINICAL DATA:  Initial encounter for Per EMS_ pt stood to go to the bathroom with her walker and lost her balance today. Pt noted to have shortening and rotation to right leg. No chest complaints. EXAM: RIGHT FEMUR 2 VIEWS COMPARISON:  Pelvic films of earlier today FINDINGS: Osteopenia. varus angulation involving mid femoral neck fracture. No dislocation. distal femur intact, with joint space narrowing involving the knee. No knee joint effusion. Artifact degradation about the distal femur. IMPRESSION: Mid femoral neck fracture, as before. Electronically Signed   By: Abigail Miyamoto M.D.   On: 02/08/2016 17:48   I have personally reviewed and evaluated these images and lab results as part of my medical decision-making.   EKG Interpretation   Date/Time:  Wednesday February 08 2016 16:25:14 EDT  Ventricular Rate:  64 PR Interval:     QRS Duration: 96 QT Interval:  455 QTC Calculation: 469 R Axis:   41 Text Interpretation:  sinus rhythm with artifact Abnormal T, consider  ischemia, lateral leads Confirmed by Jeneen Rinks  MD, [place] ([id number]) on 02/08/2016  5:04:54 PM      MDM   Final diagnoses:  Leg pain  Fall  Right hip fracture   Patient with mechanical fall presents with right femoral neck fracture. Patient is in significant pain but no distress on arrival. She has deformity to her right hip on exam. Leg is neurovascularly intact. Due to concern for fracture, x-rays obtained, patient also is anticoagulated and hit her head, we'll obtain CT of the head.  6:35 PM X-ray shows right hip fracture. INR 2.2. Spoke with on-call orthopedic surgeon who requests hospitalist admission, 10 mg of by mouth vitamin K, and nothing by mouth at midnight for possible repair tomorrow.  Hospitalizt consulted for admission.Leata Mouse, MD 02/09/16 [id number]  Tanna Furry, MD 02/18/16 2207

## 2016-02-08 NOTE — ED Notes (Addendum)
Per EMS_ pt stood to go to the bathroom with her walker and lost her balance. Denies dizziness and lightheadedness prior to fall.  Pt fell backwards hitting her head with a small laceration. Denies LOC. Pt noted to have shortening and rotation to right leg. Pt received 185mcg fentanyl.

## 2016-02-08 NOTE — ED Notes (Signed)
Call to 5N

## 2016-02-08 NOTE — ED Notes (Signed)
Patient transported to X-ray 

## 2016-02-09 DIAGNOSIS — S72001S Fracture of unspecified part of neck of right femur, sequela: Secondary | ICD-10-CM

## 2016-02-09 LAB — BASIC METABOLIC PANEL
Anion gap: 9 (ref 5–15)
BUN: 28 mg/dL — AB (ref 6–20)
CHLORIDE: 107 mmol/L (ref 101–111)
CO2: 28 mmol/L (ref 22–32)
CREATININE: 2.15 mg/dL — AB (ref 0.44–1.00)
Calcium: 9.7 mg/dL (ref 8.9–10.3)
GFR calc Af Amer: 24 mL/min — ABNORMAL LOW (ref >60)
GFR calc non Af Amer: 20 mL/min — ABNORMAL LOW (ref >60)
GLUCOSE: 109 mg/dL — AB (ref 65–99)
Potassium: 4.5 mmol/L (ref 3.5–5.1)
Sodium: 144 mmol/L (ref 135–145)

## 2016-02-09 LAB — URINALYSIS, ROUTINE W REFLEX MICROSCOPIC
BILIRUBIN URINE: NEGATIVE
Glucose, UA: NEGATIVE mg/dL
Hgb urine dipstick: NEGATIVE
KETONES UR: NEGATIVE mg/dL
NITRITE: POSITIVE — AB
PROTEIN: NEGATIVE mg/dL
Specific Gravity, Urine: 1.013 (ref 1.005–1.030)
pH: 5 (ref 5.0–8.0)

## 2016-02-09 LAB — URINE MICROSCOPIC-ADD ON: RBC / HPF: NONE SEEN RBC/hpf (ref 0–5)

## 2016-02-09 LAB — GLUCOSE, CAPILLARY
GLUCOSE-CAPILLARY: 103 mg/dL — AB (ref 65–99)
GLUCOSE-CAPILLARY: 112 mg/dL — AB (ref 65–99)
Glucose-Capillary: 103 mg/dL — ABNORMAL HIGH (ref 65–99)
Glucose-Capillary: 178 mg/dL — ABNORMAL HIGH (ref 65–99)
Glucose-Capillary: 172 mg/dL — ABNORMAL HIGH (ref 65–99)
Glucose-Capillary: 137 mg/dL — ABNORMAL HIGH (ref 65–99)

## 2016-02-09 LAB — PROTIME-INR
INR: 2.3 — ABNORMAL HIGH (ref 0.00–1.49)
Prothrombin Time: 25.1 seconds — ABNORMAL HIGH (ref 11.6–15.2)

## 2016-02-09 LAB — TROPONIN I
TROPONIN I: 0.05 ng/mL — AB (ref <0.031)
Troponin I: 0.04 ng/mL — ABNORMAL HIGH (ref <0.031)

## 2016-02-09 MED ORDER — CHLORHEXIDINE GLUCONATE 4 % EX LIQD
60.0000 mL | Freq: Once | CUTANEOUS | Status: AC
  Administered 2016-02-10: 4 via TOPICAL
  Filled 2016-02-09: qty 60

## 2016-02-09 MED ORDER — PHYTONADIONE 5 MG PO TABS
10.0000 mg | ORAL_TABLET | Freq: Once | ORAL | Status: AC
  Administered 2016-02-09: 10 mg via ORAL
  Filled 2016-02-09: qty 2

## 2016-02-09 MED ORDER — TRANEXAMIC ACID 1000 MG/10ML IV SOLN
1000.0000 mg | INTRAVENOUS | Status: DC
  Filled 2016-02-09 (×2): qty 10

## 2016-02-09 MED ORDER — GLUCERNA SHAKE PO LIQD
237.0000 mL | Freq: Two times a day (BID) | ORAL | Status: DC
  Administered 2016-02-10 – 2016-02-14 (×8): 237 mL via ORAL

## 2016-02-09 MED ORDER — CLINDAMYCIN PHOSPHATE 900 MG/50ML IV SOLN
900.0000 mg | INTRAVENOUS | Status: AC
  Administered 2016-02-10: 900 mg via INTRAVENOUS
  Filled 2016-02-09: qty 50

## 2016-02-09 MED ORDER — POVIDONE-IODINE 10 % EX SWAB: 2.0000 "application " | Freq: Once | CUTANEOUS | Status: DC

## 2016-02-09 MED ORDER — VANCOMYCIN HCL IN DEXTROSE 1-5 GM/200ML-% IV SOLN
1000.0000 mg | INTRAVENOUS | Status: DC
Start: 2016-02-10 — End: 2016-02-10

## 2016-02-09 NOTE — Progress Notes (Signed)
Initial Nutrition Assessment  DOCUMENTATION CODES:   Not applicable  INTERVENTION:  Provide Glucerna Shake po BID, each supplement provides 220 kcal and 10 grams of protein.  Encourage adequate PO intake.   NUTRITION DIAGNOSIS:   Increased nutrient needs related to  (surgery) as evidenced by estimated needs.  GOAL:   Patient will meet greater than or equal to 90% of their needs  MONITOR:   PO intake, Supplement acceptance, Weight trends, Labs, I & O's  REASON FOR ASSESSMENT:   Consult Hip fracture protocol  ASSESSMENT:   82 y.o. female with medical history significant for renal transplant with CKD 3, DVT, COPD, recurrent UTIs, and chronic diastolic CHF who presents to the ED with right hip and occipital head pain following a mechanical fall at home. radiographs of the hip demonstrate a moderately angulated right femoral neck fracture..  Plans for surgery tomorrow. Pt reports having a good appetite currently and PTA with usual consumption of at least 3 meals a day with no other difficulties. Meal completion during time of visit was 50% intake from breakfast tray. Noted no weight recorded, thus pt was weighed on bed scale which revealed 169 lbs. Pt reports usual body weight of ~160 lbs. Per Epic weight records, pt with a 9.6% weight loss in 8 months, which is found not significant for time frame. Pt unaware of weight lost. RD to order Glucerna Shake to aid in caloric and protein needs. Pt was encouraged to eat her food at meals.   Pt with no significant fat or muscle mass loss.   Labs and medications reviewed.   Diet Order:  Diet heart healthy/carb modified Room service appropriate?: Yes; Fluid consistency:: Thin  Skin:  Reviewed, no issues  Last BM:  4/26  Height:   Ht Readings from Last 1 Encounters:  05/22/15 5\' 5"  (1.651 m)    Weight:   Wt Readings from Last 1 Encounters:  05/27/15 187 lb 12.8 oz (85.186 kg)    Ideal Body Weight:  56.8 kg  BMI:  There is no  weight on file to calculate BMI.  Estimated Nutritional Needs:   Kcal:  1800-1950  Protein:  80-90 grams  Fluid:  Per MD  EDUCATION NEEDS:   No education needs identified at this time  Corrin Parker, MS, RD, LDN Pager # [phone] After hours/ weekend pager # [phone]

## 2016-02-09 NOTE — Anesthesia Preprocedure Evaluation (Addendum)
Anesthesia Evaluation  Patient identified by MRN, date of birth, ID band Patient awake    Reviewed: Allergy & Precautions, NPO status , Patient's Chart, lab work & pertinent test results, reviewed documented beta blocker date and time   Airway Mallampati: II  TM Distance: >3 FB Neck ROM: Full    Dental  (+) Teeth Intact, Dental Advisory Given   Pulmonary COPD,  COPD inhaler,    Pulmonary exam normal breath sounds clear to auscultation       Cardiovascular hypertension, Pt. on medications and Pt. on home beta blockers + CAD, + Peripheral Vascular Disease, +CHF and + DVT  Normal cardiovascular exam Rhythm:Regular Rate:Normal     Neuro/Psych PSYCHIATRIC DISORDERS Anxiety Depression negative neurological ROS     GI/Hepatic Neg liver ROS, GERD  Medicated,  Endo/Other  diabetesHypothyroidism Obesity   Renal/GU Renal InsufficiencyRenal disease (s/p transplant)     Musculoskeletal Right femoral neck fracture    Abdominal   Peds  Hematology  (+) Blood dyscrasia (warfarin therapy), ,   Anesthesia Other Findings Day of surgery medications reviewed with the patient.  Reproductive/Obstetrics                            Anesthesia Physical Anesthesia Plan  ASA: III  Anesthesia Plan: General   Post-op Pain Management:    Induction: Intravenous  Airway Management Planned: Oral ETT  Additional Equipment:   Intra-op Plan:   Post-operative Plan: Extubation in OR  Informed Consent: I have reviewed the patients History and Physical, chart, labs and discussed the procedure including the risks, benefits and alternatives for the proposed anesthesia with the patient or authorized representative who has indicated his/her understanding and acceptance.   Dental advisory given  Plan Discussed with: CRNA  Anesthesia Plan Comments: (Risks/benefits of general anesthesia discussed with patient including  risk of damage to teeth, lips, gum, and tongue, nausea/vomiting, allergic reactions to medications, and the possibility of heart attack, stroke and death.  All patient questions answered.  Patient wishes to proceed.  Patient arrived in holding area at 0722 AM)       Anesthesia Quick Evaluation

## 2016-02-09 NOTE — Progress Notes (Signed)
Utilization review completed.  

## 2016-02-09 NOTE — Progress Notes (Signed)
PROGRESS NOTE                                                                                                                                                                                                             Patient Demographics:    Patricia [name], is a 82 y.o. female, DOB - Mar 09, 1932, [id number]  Admit date - 02/08/2016   Admitting Physician [name], MD  Outpatient Primary MD for the patient is Patricia Argyle, MD  LOS - 1  Outpatient Specialists: [facility], [facility]  Chief Complaint  Patient presents with  . Fall       Brief Narrative   82 y.o. female with medical history significant for renal transplant with CKD 3, DVT, COPD, recurrent UTIs, and chronic diastolic CHF who presents to the ED with right hip and occipital head pain following a mechanical fall at home. Patient reports that she was in her usual state of health with no recent fevers, chills, lightheadedness, or dizziness when she suffered a mechanical fall at home today, striking the back of her head on the ground. Patient is anticoagulated with Coumadin for an unprovoked DVT. She has gait problems that have been evaluated by neurology and attributed to arthritis and resulting pain. She has been using a walker and was walking several steps without the walker today when she fell backwards. There was no loss of consciousness, no nausea or vomiting, and no confusion, focal numbness or weakness, or loss of coordination. Prior to the fall, she was in her usual state of health.  ED Course: Upon arrival to the ED, patient is found to be saturating well on room air and with vital signs stable. Head CT was obtained and negative for acute intracranial abnormality. EKG demonstrates an arrhythmia, possibly atrial fibrillation. Chest x-ray is negative for acute cardiopulmonary disease but radiographs of the hip demonstrate a moderately angulated right femoral  neck fracture   Subjective:    [name] Has no new complaints today   Assessment  & Plan :    Principal Problem:   Closed right hip fracture [facility]) - Orthopedic surgeon on board and assisting with management - Continue supportive therapy - Awaiting physical therapy recommendations - awaiting INR to trend down recently given Vit K  UTI - continue current antibiotics - f/u with urine cx  Active Problems:  Hx of kidney transplant - continue current regimen and monitor    Hypothyroidism - stable    Depression - continue prozac    Anxiety   COPD (chronic obstructive pulmonary disease) (HCC) - stable currently    Diabetes ([place]) - diabetic diet - continue SSI    DVT, lower extremity, proximal ([place]) - INR too high for surgical intervention for hip fracture repair. Awaiting INR to trend down    CKD (chronic kidney disease) stage 3, GFR 30-59 ml/min   Chronic diastolic CHF (congestive heart failure) ([place])  Code Status : Full  Family Communication  : Discussed directly with patient  Disposition Plan  : Pending recommendations by specialist  Barriers For Discharge : awaiting surgical intervention for hip fracture  Consults  :  Orthopedic surgeon  Procedures  : Pending  DVT Prophylaxis  :  Elevated INR secondary to history of, patient on warfarin as outpatient  Lab Results  Component Value Date   PLT 219 02/08/2016    Antibiotics  :  Clindamycin Bactrim vancomycin  Anti-infectives    Start     Dose/Rate Route Frequency Ordered Stop   02/10/16 0700  clindamycin (CLEOCIN) IVPB 900 mg     900 mg 100 mL/hr over 30 Minutes Intravenous To [facility] 02/09/16 1106 02/11/16 0700   02/10/16 0630  vancomycin (VANCOCIN) IVPB 1000 mg/200 mL premix     1,000 mg 200 mL/hr over 60 Minutes Intravenous To [facility] 02/09/16 1106 02/11/16 0630   02/08/16 1915  sulfamethoxazole-trimethoprim (BACTRIM,SEPTRA) 400-80 MG per tablet 1 tablet     1 tablet Oral  Once per day on Mon Wed Fri 02/08/16 1902          Objective:   Filed Vitals:   02/09/16 0243 02/09/16 0437 02/09/16 0916 02/09/16 1313  BP: 157/61 117/55  109/73  Pulse: 73 65 65   Temp: 97.7 F (36.5 C) 97.8 F (36.6 C)  98.5 F (36.9 C)  TempSrc: Oral Oral  Oral  Resp: 18 16 16 18   SpO2: 91% 91% 92% 92%    Wt Readings from Last 3 Encounters:  05/27/15 85.186 kg (187 lb 12.8 oz)  03/02/14 77.066 kg (169 lb 14.4 oz)  02/11/14 76.204 kg (168 lb)     Intake/Output Summary (Last 24 hours) at 02/09/16 1557 Last data filed at 02/09/16 1314  Gross per 24 hour  Intake    560 ml  Output    475 ml  Net     85 ml     Physical Exam  Awake Alert, No new F.N deficits, Normal affect Supple Neck,No JVD, No cervical lymphadenopathy appriciated.  Symmetrical Chest wall movement, Good air movement bilaterally, CTAB RRR,No Gallops,Rubs or new Murmurs, +ve B.Sounds, Abd Soft, No tenderness, No organomegaly appriciated, No rebound - guarding or rigidity. No  Clubbing, equal tone    Data Review:    CBC  Recent Labs Lab 02/08/16 1650 02/08/16 1702  WBC 7.9  --   HGB 12.8 14.3  HCT 38.8 42.0  PLT 219  --   MCV 91.7  --   MCH 30.3  --   MCHC 33.0  --   RDW 15.7*  --   LYMPHSABS 1.7  --   MONOABS 0.4  --   EOSABS 0.0  --   BASOSABS 0.0  --     Chemistries   Recent Labs Lab 02/08/16 1650 02/08/16 1702 02/09/16 0510  NA 142 141 144  K 4.2 4.1 4.5  CL 109 109  107  CO2 21*  --  28  GLUCOSE 147* 138* 109*  BUN 31* 29* 28*  CREATININE 1.92* 2.00* 2.15*  CALCIUM 10.0  --  9.7   ------------------------------------------------------------------------------------------------------------------ No results for input(s): CHOL, HDL, LDLCALC, TRIG, CHOLHDL, LDLDIRECT in the last 72 hours.  Lab Results  Component Value Date   HGBA1C 6.2* 03/02/2014   ------------------------------------------------------------------------------------------------------------------ No  results for input(s): TSH, T4TOTAL, T3FREE, THYROIDAB in the last 72 hours.  Invalid input(s): FREET3 ------------------------------------------------------------------------------------------------------------------ No results for input(s): VITAMINB12, FOLATE, FERRITIN, TIBC, IRON, RETICCTPCT in the last 72 hours.  Coagulation profile  Recent Labs Lab 02/08/16 1650 02/08/16 1935 02/09/16 0510  INR 2.18* 2.52* 2.30*    No results for input(s): DDIMER in the last 72 hours.  Cardiac Enzymes  Recent Labs Lab 02/08/16 1935 02/09/16 0040 02/09/16 0616  TROPONINI 0.03 0.04* 0.05*   ------------------------------------------------------------------------------------------------------------------ No results found for: BNP  Inpatient Medications  Scheduled Meds: . acidophilus  1 capsule Oral Daily  . amLODipine  5 mg Oral Daily  . azaTHIOprine  50 mg Oral BID  . cholecalciferol  2,000 Units Oral QPM  . [START ON 02/10/2016] clindamycin (CLEOCIN) IV  900 mg Intravenous To SS-Surg  . cloNIDine  0.1 mg Oral BID  . colesevelam  1,875 mg Oral BID WC  . cycloSPORINE modified  75 mg Oral BID  . darifenacin  7.5 mg Oral q morning - 10a  . feeding supplement (GLUCERNA SHAKE)  237 mL Oral BID BM  . fenofibrate  54 mg Oral Daily  . FLUoxetine  20 mg Oral Daily  . furosemide  40 mg Oral QPM  . furosemide  80 mg Oral Daily  . insulin aspart  0-9 Units Subcutaneous Q4H  . levothyroxine  75 mcg Oral QAC breakfast  . loratadine  10 mg Oral Daily  . metoprolol succinate  100 mg Oral Daily  . mirabegron ER  25 mg Oral QPM  . mometasone-formoterol  2 puff Inhalation BID  . multivitamin with minerals  1 tablet Oral Daily  . omega-3 acid ethyl esters  1 capsule Oral Daily  . oxybutynin  10 mg Oral QHS  . pantoprazole  40 mg Oral Daily  . predniSONE  5 mg Oral Q breakfast  . sulfamethoxazole-trimethoprim  1 tablet Oral Once per day on Mon Wed Fri  . [START ON 02/10/2016] tranexamic acid   (ORTHO-IV)  1,000 mg Intravenous To OR  . [START ON 02/10/2016] vancomycin  1,000 mg Intravenous To SS-Surg   Continuous Infusions:  PRN Meds:.bisacodyl, fentaNYL (SUBLIMAZE) injection, fluticasone, hydrALAZINE, HYDROcodone-acetaminophen, hyoscyamine, ipratropium-albuterol, methocarbamol **OR** methocarbamol (ROBAXIN)  IV, ondansetron, polyethylene glycol  Micro Results No results found for this or any previous visit (from the past 240 hour(s)).  Radiology Reports Dg Chest 1 View  02/08/2016  CLINICAL DATA:  Per EMS_ pt stood to go to the bathroom with her walker and lost her balance today. Pt noted to have shortening and rotation to right leg. No chest complaints. EXAM: CHEST 1 VIEW COMPARISON:  05/26/2015 FINDINGS: Normal cardiac silhouette. Calcified granulomas noted. No effusion, infiltrate pneumothorax. No acute osseous abnormality. IMPRESSION: No acute cardiopulmonary process. Electronically Signed   By: Suzy Bouchard M.D.   On: 02/08/2016 17:35   Dg Pelvis 1-2 Views  02/08/2016  CLINICAL DATA:  Right leg shortening after fall today. EXAM: PELVIS - 1-2 VIEW COMPARISON:  None. FINDINGS: Moderately angulated fracture involving the right femoral neck is noted. Left hip appears normal. Sacroiliac joints appear normal. IMPRESSION: Moderately angulated right  femoral neck fracture. Electronically Signed   By: Marijo Conception, M.D.   On: 02/08/2016 17:39   Ct Head Wo Contrast  02/08/2016  CLINICAL DATA:  Dizziness, head laceration after fall. No loss of consciousness. EXAM: CT HEAD WITHOUT CONTRAST TECHNIQUE: Contiguous axial images were obtained from the base of the skull through the vertex without intravenous contrast. COMPARISON:  CT scan of April 23, 2012. FINDINGS: Status post left occipital craniotomy. Minimal diffuse cortical atrophy is noted. Mild chronic ischemic white matter disease is noted. Stable 2.2 cm hyperdense mass is seen in left posterior fossa. Stable encephalomalacia of left  cerebellar hemisphere is noted. There is no evidence of hemorrhage or acute infarction. Ventricular size is within normal limits. No mass effect or midline shift is noted. IMPRESSION: Stable postoperative changes are seen involving the left occipital skull and left cerebellar hemisphere with associated 2.2 cm hyperdense mass most consistent with meningioma. No significant changes noted compared to prior exam. Electronically Signed   By: Marijo Conception, M.D.   On: 02/08/2016 18:35   Dg Femur, Min 2 Views Right  02/08/2016  CLINICAL DATA:  Initial encounter for Per EMS_ pt stood to go to the bathroom with her walker and lost her balance today. Pt noted to have shortening and rotation to right leg. No chest complaints. EXAM: RIGHT FEMUR 2 VIEWS COMPARISON:  Pelvic films of earlier today FINDINGS: Osteopenia. varus angulation involving mid femoral neck fracture. No dislocation. distal femur intact, with joint space narrowing involving the knee. No knee joint effusion. Artifact degradation about the distal femur. IMPRESSION: Mid femoral neck fracture, as before. Electronically Signed   By: Abigail Miyamoto M.D.   On: 02/08/2016 17:48    Time Spent in minutes  20   Velvet Bathe M.D on 02/09/2016 at 3:57 PM  Between 7am to 7pm - Pager - [phone]  After 7pm go to [email or web address] - password [email or web address]  [facility] -  Office  [phone]

## 2016-02-09 NOTE — Progress Notes (Signed)
Patient seen and evaluated. Given 10mg  PO vitamin K yesterday  INR 2.3 today  Displaced R femoral neck fx Chronic coumadin S/p kidney transplant with immunosuppressive meds  Plan: Discussed R hip hemi vs THA. She is at increased risk for infection due to meds. Even still, recommend surgery for pain control and to restore mobility. Plan for surgery tentatively tomorrow depending on INR. Following.

## 2016-02-10 ENCOUNTER — Inpatient Hospital Stay (HOSPITAL_COMMUNITY): Payer: Medicare Other | Admitting: Anesthesiology

## 2016-02-10 ENCOUNTER — Inpatient Hospital Stay (HOSPITAL_COMMUNITY): Payer: Medicare Other

## 2016-02-10 ENCOUNTER — Encounter (HOSPITAL_COMMUNITY)
Admission: EM | Disposition: A | Discharge status: Skilled Nursing Facility | Payer: Self-pay | Source: Home / Self Care | Attending: Family Medicine

## 2016-02-10 DIAGNOSIS — I5032 Chronic diastolic (congestive) heart failure: Secondary | ICD-10-CM

## 2016-02-10 DIAGNOSIS — J42 Unspecified chronic bronchitis: Secondary | ICD-10-CM

## 2016-02-10 DIAGNOSIS — S72001A Fracture of unspecified part of neck of right femur, initial encounter for closed fracture: Secondary | ICD-10-CM | POA: Diagnosis present

## 2016-02-10 HISTORY — PX: ANTERIOR APPROACH HEMI HIP ARTHROPLASTY: SHX6690

## 2016-02-10 LAB — CBC
HCT: 39.9 % (ref 36.0–46.0)
HEMATOCRIT: 31.6 % — AB (ref 36.0–46.0)
Hemoglobin: 12.7 g/dL (ref 12.0–15.0)
Hemoglobin: 10 g/dL — ABNORMAL LOW (ref 12.0–15.0)
MCH: 29.6 pg (ref 26.0–34.0)
MCH: 30.4 pg (ref 26.0–34.0)
MCHC: 31.8 g/dL (ref 30.0–36.0)
MCHC: 31.6 g/dL (ref 30.0–36.0)
MCV: 93 fL (ref 78.0–100.0)
MCV: 96 fL (ref 78.0–100.0)
PLATELETS: 203 10*3/uL (ref 150–400)
PLATELETS: 171 10*3/uL (ref 150–400)
RBC: 4.29 MIL/uL (ref 3.87–5.11)
RBC: 3.29 MIL/uL — ABNORMAL LOW (ref 3.87–5.11)
RDW: 15.6 % — AB (ref 11.5–15.5)
RDW: 15.8 % — AB (ref 11.5–15.5)
WBC: 10.5 10*3/uL (ref 4.0–10.5)
WBC: 11.1 10*3/uL — AB (ref 4.0–10.5)

## 2016-02-10 LAB — PROTIME-INR
INR: 1.38 (ref 0.00–1.49)
PROTHROMBIN TIME: 17.1 s — AB (ref 11.6–15.2)

## 2016-02-10 LAB — GLUCOSE, CAPILLARY
GLUCOSE-CAPILLARY: 122 mg/dL — AB (ref 65–99)
GLUCOSE-CAPILLARY: 184 mg/dL — AB (ref 65–99)
GLUCOSE-CAPILLARY: 137 mg/dL — AB (ref 65–99)
Glucose-Capillary: 130 mg/dL — ABNORMAL HIGH (ref 65–99)
Glucose-Capillary: 169 mg/dL — ABNORMAL HIGH (ref 65–99)

## 2016-02-10 LAB — CREATININE, SERUM
CREATININE: 2.65 mg/dL — AB (ref 0.44–1.00)
GFR calc Af Amer: 18 mL/min — ABNORMAL LOW (ref >60)
GFR calc non Af Amer: 16 mL/min — ABNORMAL LOW (ref >60)

## 2016-02-10 LAB — BASIC METABOLIC PANEL
ANION GAP: 15 (ref 5–15)
BUN: 28 mg/dL — AB (ref 6–20)
CO2: 19 mmol/L — ABNORMAL LOW (ref 22–32)
Calcium: 9.8 mg/dL (ref 8.9–10.3)
Chloride: 105 mmol/L (ref 101–111)
Creatinine, Ser: 2.07 mg/dL — ABNORMAL HIGH (ref 0.44–1.00)
GFR calc Af Amer: 25 mL/min — ABNORMAL LOW (ref >60)
GFR calc non Af Amer: 21 mL/min — ABNORMAL LOW (ref >60)
GLUCOSE: 111 mg/dL — AB (ref 65–99)
Potassium: 3.7 mmol/L (ref 3.5–5.1)
Sodium: 139 mmol/L (ref 135–145)

## 2016-02-10 LAB — SURGICAL PCR SCREEN
MRSA, PCR: NEGATIVE
STAPHYLOCOCCUS AUREUS: NEGATIVE

## 2016-02-10 SURGERY — HEMIARTHROPLASTY, HIP, DIRECT ANTERIOR APPROACH, FOR FRACTURE
Anesthesia: General | Laterality: Right

## 2016-02-10 MED ORDER — PROPOFOL 10 MG/ML IV BOLUS
INTRAVENOUS | Status: AC
  Filled 2016-02-10: qty 20

## 2016-02-10 MED ORDER — FENTANYL CITRATE (PF) 250 MCG/5ML IJ SOLN
INTRAMUSCULAR | Status: AC
  Filled 2016-02-10: qty 5

## 2016-02-10 MED ORDER — SODIUM CHLORIDE 0.9 % IV SOLN
INTRAVENOUS | Status: DC
  Administered 2016-02-10: 13:00:00 via INTRAVENOUS

## 2016-02-10 MED ORDER — ACETAMINOPHEN 650 MG RE SUPP: 650.0000 mg | Freq: Four times a day (QID) | RECTAL | Status: DC | PRN

## 2016-02-10 MED ORDER — VANCOMYCIN HCL IN DEXTROSE 1-5 GM/200ML-% IV SOLN
INTRAVENOUS | Status: AC
  Filled 2016-02-10: qty 200

## 2016-02-10 MED ORDER — PHENYLEPHRINE HCL 10 MG/ML IJ SOLN
10.0000 mg | INTRAVENOUS | Status: DC | PRN
  Administered 2016-02-10: 20 ug/min via INTRAVENOUS

## 2016-02-10 MED ORDER — SORBITOL 70 % SOLN
30.0000 mL | Freq: Every day | Status: DC | PRN
  Administered 2016-02-13: 30 mL via ORAL
  Filled 2016-02-10: qty 30

## 2016-02-10 MED ORDER — BUPIVACAINE-EPINEPHRINE (PF) 0.5% -1:200000 IJ SOLN
INTRAMUSCULAR | Status: DC | PRN
  Administered 2016-02-10: 30 mL

## 2016-02-10 MED ORDER — NEOSTIGMINE METHYLSULFATE 10 MG/10ML IV SOLN
INTRAVENOUS | Status: DC | PRN
  Administered 2016-02-10: 3 mg via INTRAVENOUS

## 2016-02-10 MED ORDER — SODIUM CHLORIDE 0.9 % IR SOLN
Status: DC | PRN
  Administered 2016-02-10: 3000 mL

## 2016-02-10 MED ORDER — ACETAMINOPHEN 325 MG PO TABS: 650.0000 mg | ORAL_TABLET | Freq: Four times a day (QID) | ORAL | Status: DC | PRN

## 2016-02-10 MED ORDER — ENOXAPARIN SODIUM 40 MG/0.4ML ~~LOC~~ SOLN
40.0000 mg | SUBCUTANEOUS | Status: DC
  Administered 2016-02-11 – 2016-02-12 (×2): 40 mg via SUBCUTANEOUS
  Filled 2016-02-10 (×2): qty 0.4

## 2016-02-10 MED ORDER — BUPIVACAINE HCL (PF) 0.5 % IJ SOLN
INTRAMUSCULAR | Status: AC
  Filled 2016-02-10: qty 30

## 2016-02-10 MED ORDER — BUPIVACAINE-EPINEPHRINE (PF) 0.5% -1:200000 IJ SOLN
INTRAMUSCULAR | Status: AC
  Filled 2016-02-10: qty 30

## 2016-02-10 MED ORDER — VANCOMYCIN HCL 1000 MG IV SOLR
1000.0000 mg | INTRAVENOUS | Status: DC | PRN
  Administered 2016-02-10: 1000 mg via INTRAVENOUS

## 2016-02-10 MED ORDER — ONDANSETRON HCL 4 MG PO TABS
4.0000 mg | ORAL_TABLET | Freq: Four times a day (QID) | ORAL | Status: DC | PRN
  Administered 2016-02-11: 4 mg via ORAL
  Filled 2016-02-10: qty 1

## 2016-02-10 MED ORDER — SENNA 8.6 MG PO TABS
1.0000 | ORAL_TABLET | Freq: Two times a day (BID) | ORAL | Status: DC
  Administered 2016-02-10 – 2016-02-14 (×8): 8.6 mg via ORAL
  Filled 2016-02-10 (×8): qty 1

## 2016-02-10 MED ORDER — NEOSTIGMINE METHYLSULFATE 10 MG/10ML IV SOLN
INTRAVENOUS | Status: AC
  Filled 2016-02-10: qty 1

## 2016-02-10 MED ORDER — KETOROLAC TROMETHAMINE 30 MG/ML IJ SOLN
INTRAMUSCULAR | Status: DC | PRN
  Administered 2016-02-10: 30 mg via INTRA_ARTICULAR

## 2016-02-10 MED ORDER — LIDOCAINE HCL (CARDIAC) 20 MG/ML IV SOLN
INTRAVENOUS | Status: AC
  Filled 2016-02-10: qty 5

## 2016-02-10 MED ORDER — SODIUM CHLORIDE 0.9 % IV SOLN
INTRAVENOUS | Status: DC | PRN
  Administered 2016-02-10 (×2): via INTRAVENOUS

## 2016-02-10 MED ORDER — SODIUM CHLORIDE 0.9 % IJ SOLN
INTRAMUSCULAR | Status: DC | PRN
  Administered 2016-02-10 (×3): 10 mL

## 2016-02-10 MED ORDER — EPHEDRINE SULFATE 50 MG/ML IJ SOLN
INTRAMUSCULAR | Status: DC | PRN
  Administered 2016-02-10 (×4): 5 mg via INTRAVENOUS
  Administered 2016-02-10: 10 mg via INTRAVENOUS

## 2016-02-10 MED ORDER — ONDANSETRON HCL 4 MG/2ML IJ SOLN
INTRAMUSCULAR | Status: AC
  Filled 2016-02-10: qty 2

## 2016-02-10 MED ORDER — VANCOMYCIN HCL IN DEXTROSE 1-5 GM/200ML-% IV SOLN
1000.0000 mg | Freq: Two times a day (BID) | INTRAVENOUS | Status: AC
  Administered 2016-02-11: 1000 mg via INTRAVENOUS
  Filled 2016-02-10: qty 200

## 2016-02-10 MED ORDER — HYDROMORPHONE HCL 1 MG/ML IJ SOLN: 0.1000 mg | INTRAMUSCULAR | Status: DC | PRN

## 2016-02-10 MED ORDER — CISATRACURIUM BESYLATE (PF) 10 MG/5ML IV SOLN
INTRAVENOUS | Status: DC | PRN
  Administered 2016-02-10: 14 mg via INTRAVENOUS

## 2016-02-10 MED ORDER — PHENOL 1.4 % MT LIQD: 1.0000 | OROMUCOSAL | Status: DC | PRN

## 2016-02-10 MED ORDER — WARFARIN SODIUM 5 MG PO TABS
5.0000 mg | ORAL_TABLET | Freq: Once | ORAL | Status: AC
  Administered 2016-02-10: 5 mg via ORAL
  Filled 2016-02-10: qty 1

## 2016-02-10 MED ORDER — HYDROCODONE-ACETAMINOPHEN 5-325 MG PO TABS
1.0000 | ORAL_TABLET | Freq: Four times a day (QID) | ORAL | Status: DC | PRN
  Administered 2016-02-10 – 2016-02-14 (×6): 1 via ORAL
  Filled 2016-02-10 (×6): qty 1

## 2016-02-10 MED ORDER — ONDANSETRON HCL 4 MG/2ML IJ SOLN: 4.0000 mg | Freq: Four times a day (QID) | INTRAMUSCULAR | Status: DC | PRN

## 2016-02-10 MED ORDER — GLYCOPYRROLATE 0.2 MG/ML IJ SOLN
INTRAMUSCULAR | Status: DC | PRN
  Administered 2016-02-10: 0.4 mg via INTRAVENOUS

## 2016-02-10 MED ORDER — DOCUSATE SODIUM 100 MG PO CAPS
100.0000 mg | ORAL_CAPSULE | Freq: Two times a day (BID) | ORAL | Status: DC
  Administered 2016-02-10 – 2016-02-14 (×8): 100 mg via ORAL
  Filled 2016-02-10 (×8): qty 1

## 2016-02-10 MED ORDER — MENTHOL 3 MG MT LOZG: 1.0000 | LOZENGE | OROMUCOSAL | Status: DC | PRN

## 2016-02-10 MED ORDER — ROCURONIUM BROMIDE 50 MG/5ML IV SOLN
INTRAVENOUS | Status: AC
  Filled 2016-02-10: qty 1

## 2016-02-10 MED ORDER — SODIUM CHLORIDE 0.9 % IV SOLN
INTRAVENOUS | Status: DC | PRN
  Administered 2016-02-10: 1000 mL

## 2016-02-10 MED ORDER — WARFARIN - PHARMACIST DOSING INPATIENT
Freq: Every day | Status: DC
  Administered 2016-02-11 – 2016-02-13 (×2)

## 2016-02-10 MED ORDER — ONDANSETRON HCL 4 MG/2ML IJ SOLN: 4.0000 mg | Freq: Once | INTRAMUSCULAR | Status: DC | PRN

## 2016-02-10 MED ORDER — METOCLOPRAMIDE HCL 5 MG PO TABS
5.0000 mg | ORAL_TABLET | Freq: Three times a day (TID) | ORAL | Status: DC | PRN
Start: 2016-02-10 — End: 2016-02-14

## 2016-02-10 MED ORDER — MAGNESIUM CITRATE PO SOLN: 1.0000 | Freq: Once | ORAL | Status: DC | PRN

## 2016-02-10 MED ORDER — GLYCOPYRROLATE 0.2 MG/ML IJ SOLN
INTRAMUSCULAR | Status: AC
  Filled 2016-02-10: qty 2

## 2016-02-10 MED ORDER — LIDOCAINE HCL (CARDIAC) 20 MG/ML IV SOLN
INTRAVENOUS | Status: DC | PRN
  Administered 2016-02-10: 100 mg via INTRAVENOUS

## 2016-02-10 MED ORDER — METOCLOPRAMIDE HCL 5 MG/ML IJ SOLN: 5.0000 mg | Freq: Three times a day (TID) | INTRAMUSCULAR | Status: DC | PRN

## 2016-02-10 MED ORDER — PHENYLEPHRINE 40 MCG/ML (10ML) SYRINGE FOR IV PUSH (FOR BLOOD PRESSURE SUPPORT)
PREFILLED_SYRINGE | INTRAVENOUS | Status: AC
  Filled 2016-02-10: qty 10

## 2016-02-10 MED ORDER — CYCLOSPORINE MODIFIED (NEORAL) 25 MG PO CAPS
100.0000 mg | ORAL_CAPSULE | Freq: Two times a day (BID) | ORAL | Status: DC
  Administered 2016-02-10 – 2016-02-14 (×8): 100 mg via ORAL
  Filled 2016-02-10 (×9): qty 4

## 2016-02-10 MED ORDER — PROPOFOL 10 MG/ML IV BOLUS
INTRAVENOUS | Status: DC | PRN
Start: 2016-02-10 — End: 2016-02-10
  Administered 2016-02-10: 100 mg via INTRAVENOUS

## 2016-02-10 MED ORDER — FENTANYL CITRATE (PF) 100 MCG/2ML IJ SOLN: 25.0000 ug | INTRAMUSCULAR | Status: DC | PRN

## 2016-02-10 MED ORDER — 0.9 % SODIUM CHLORIDE (POUR BTL) OPTIME
TOPICAL | Status: DC | PRN
  Administered 2016-02-10: 1000 mL

## 2016-02-10 MED ORDER — ONDANSETRON HCL 4 MG/2ML IJ SOLN
INTRAMUSCULAR | Status: DC | PRN
  Administered 2016-02-10: 4 mg via INTRAVENOUS

## 2016-02-10 SURGICAL SUPPLY — 56 items
ADH SKN CLS APL DERMABOND .7 (GAUZE/BANDAGES/DRESSINGS) ×1
BLADE SAW SGTL 18X1.27X75 (BLADE) ×2 IMPLANT
BLADE SAW SGTL 18X1.27X75MM (BLADE) ×1
BLADE SURG ROTATE 9660 (MISCELLANEOUS) IMPLANT
CAPT HIP TOTAL 2 ×2 IMPLANT
CHLORAPREP W/TINT 26ML (MISCELLANEOUS) ×3 IMPLANT
COVER SURGICAL LIGHT HANDLE (MISCELLANEOUS) ×3 IMPLANT
DERMABOND ADVANCED (GAUZE/BANDAGES/DRESSINGS) ×2
DERMABOND ADVANCED .7 DNX12 (GAUZE/BANDAGES/DRESSINGS) ×2 IMPLANT
DRAPE C-ARM 42X72 X-RAY (DRAPES) ×3 IMPLANT
DRAPE IMP U-DRAPE 54X76 (DRAPES) ×6 IMPLANT
DRAPE STERI IOBAN 125X83 (DRAPES) ×3 IMPLANT
DRAPE U-SHAPE 47X51 STRL (DRAPES) ×9 IMPLANT
DRESSING AQUACEL AG SP 3.5X6 (GAUZE/BANDAGES/DRESSINGS) IMPLANT
DRSG AQUACEL AG ADV 3.5X10 (GAUZE/BANDAGES/DRESSINGS) ×3 IMPLANT
DRSG AQUACEL AG SP 3.5X6 (GAUZE/BANDAGES/DRESSINGS) ×3
ELECT BLADE 4.0 EZ CLEAN MEGAD (MISCELLANEOUS) ×3
ELECT REM PT RETURN 9FT ADLT (ELECTROSURGICAL) ×3
ELECTRODE BLDE 4.0 EZ CLN MEGD (MISCELLANEOUS) ×1 IMPLANT
ELECTRODE REM PT RTRN 9FT ADLT (ELECTROSURGICAL) ×1 IMPLANT
EVACUATOR 1/8 PVC DRAIN (DRAIN) IMPLANT
GLOVE BIO SURGEON STRL SZ8.5 (GLOVE) ×6 IMPLANT
GLOVE BIOGEL PI IND STRL 8.5 (GLOVE) ×1 IMPLANT
GLOVE BIOGEL PI INDICATOR 8.5 (GLOVE) ×2
GOWN STRL REUS W/ TWL LRG LVL3 (GOWN DISPOSABLE) ×2 IMPLANT
GOWN STRL REUS W/TWL 2XL LVL3 (GOWN DISPOSABLE) ×3 IMPLANT
GOWN STRL REUS W/TWL LRG LVL3 (GOWN DISPOSABLE) ×6
HANDPIECE INTERPULSE COAX TIP (DISPOSABLE) ×3
HOOD PEEL AWAY FACE SHEILD DIS (HOOD) ×6 IMPLANT
KIT BASIN OR (CUSTOM PROCEDURE TRAY) ×3 IMPLANT
KIT ROOM TURNOVER OR (KITS) ×3 IMPLANT
MANIFOLD NEPTUNE II (INSTRUMENTS) ×3 IMPLANT
MARKER SKIN DUAL TIP RULER LAB (MISCELLANEOUS) ×3 IMPLANT
NDL SPNL 18GX3.5 QUINCKE PK (NEEDLE) ×1 IMPLANT
NEEDLE SPNL 18GX3.5 QUINCKE PK (NEEDLE) ×3 IMPLANT
NS IRRIG 1000ML POUR BTL (IV SOLUTION) ×3 IMPLANT
PACK TOTAL JOINT (CUSTOM PROCEDURE TRAY) ×3 IMPLANT
PACK UNIVERSAL I (CUSTOM PROCEDURE TRAY) ×3 IMPLANT
PAD ARMBOARD 7.5X6 YLW CONV (MISCELLANEOUS) ×6 IMPLANT
SEALER BIPOLAR AQUA 6.0 (INSTRUMENTS) ×2 IMPLANT
SET HNDPC FAN SPRY TIP SCT (DISPOSABLE) ×1 IMPLANT
SUCTION FRAZIER HANDLE 10FR (MISCELLANEOUS) ×2
SUCTION TUBE FRAZIER 10FR DISP (MISCELLANEOUS) ×1 IMPLANT
SUT ETHIBOND NAB CT1 #1 30IN (SUTURE) ×6 IMPLANT
SUT MNCRL AB 3-0 PS2 18 (SUTURE) ×3 IMPLANT
SUT MON AB 2-0 CT1 36 (SUTURE) ×5 IMPLANT
SUT VIC AB 1 CT1 27 (SUTURE) ×3
SUT VIC AB 1 CT1 27XBRD ANBCTR (SUTURE) ×1 IMPLANT
SUT VIC AB 2-0 CT1 27 (SUTURE) ×6
SUT VIC AB 2-0 CT1 TAPERPNT 27 (SUTURE) ×1 IMPLANT
SUT VLOC 180 0 24IN GS25 (SUTURE) ×3 IMPLANT
SYR 50ML LL SCALE MARK (SYRINGE) ×3 IMPLANT
TOWEL OR 17X24 6PK STRL BLUE (TOWEL DISPOSABLE) ×3 IMPLANT
TOWEL OR 17X26 10 PK STRL BLUE (TOWEL DISPOSABLE) ×3 IMPLANT
TRAY FOLEY CATH 16FR SILVER (SET/KITS/TRAYS/PACK) IMPLANT
WATER STERILE IRR 1000ML POUR (IV SOLUTION) ×3 IMPLANT

## 2016-02-10 NOTE — Progress Notes (Signed)
PROGRESS NOTE                                                                                                                                                                                                             Patient Demographics:    Patricia [name], is a 82 y.o. female, DOB - 1932/09/07, [id number]  Admit date - 02/08/2016   Admitting Physician [name], MD  Outpatient Primary MD for the patient is [name], MD  LOS - 2  Outpatient Specialists: [facility]  Chief Complaint  Patient presents with  . Fall       Brief Narrative   82 y.o. female with medical history significant for renal transplant with CKD 3, DVT, COPD, recurrent UTIs, and chronic diastolic CHF who presents to the ED with right hip and occipital head pain following a mechanical fall at home. Patient reports that she was in her usual state of health with no recent fevers, chills, lightheadedness, or dizziness when she suffered a mechanical fall at home today, striking the back of her head on the ground. Patient is anticoagulated with Coumadin for an unprovoked DVT. She has gait problems that have been evaluated by neurology and attributed to arthritis and resulting pain. She has been using a walker and was walking several steps without the walker today when she fell backwards. There was no loss of consciousness, no nausea or vomiting, and no confusion, focal numbness or weakness, or loss of coordination. Prior to the fall, she was in her usual state of health.  ED Course: Upon arrival to the ED, patient is found to be saturating well on room air and with vital signs stable. Head CT was obtained and negative for acute intracranial abnormality. EKG demonstrates an arrhythmia, possibly atrial fibrillation. Chest x-ray is negative for acute cardiopulmonary disease but radiographs of the hip demonstrate a moderately angulated right femoral  neck fracture   Subjective:    [name] Has no new medical problems reported to me today   Assessment  & Plan :    Principal Problem:   Closed right hip fracture [facility]) - Orthopedic surgeon on board and assisting with management - Continue supportive therapy - Awaiting physical therapy recommendations  UTI - continue current antibiotics - f/u with urine cx  Active Problems:   Hx of kidney transplant -  continue current regimen and monitor    Hypothyroidism - stable    Depression - continue prozac    Anxiety   COPD (chronic obstructive pulmonary disease) (HCC) - stable currently    Diabetes ([place]) - diabetic diet - continue SSI    DVT, lower extremity, proximal ([place]) - INR too high for surgical intervention for hip fracture repair. INR trending down after vitamin K - Pharmacy to manage coumadin    CKD (chronic kidney disease) stage 3, GFR 30-59 ml/min   Chronic diastolic CHF (congestive heart failure) ([place])  Code Status : Full  Family Communication  : Discussed directly with patient  Disposition Plan  : Pending recommendations by specialist  Barriers For Discharge : awaiting PT recommendations for disposition.  Consults  :  Orthopedic surgeon  Procedures  : Please refer to ortho's notes  DVT Prophylaxis  :  Elevated INR secondary to history of, patient on warfarin as outpatient  Lab Results  Component Value Date   PLT 203 02/10/2016    Antibiotics  :  Clindamycin Bactrim vancomycin  Anti-infectives    Start     Dose/Rate Route Frequency Ordered Stop   02/10/16 0700  clindamycin (CLEOCIN) IVPB 900 mg     900 mg 100 mL/hr over 30 Minutes Intravenous To ShortStay Surgical 02/09/16 1106 02/10/16 0806   02/10/16 0630  vancomycin (VANCOCIN) IVPB 1000 mg/200 mL premix  Status:  Discontinued     1,000 mg 200 mL/hr over 60 Minutes Intravenous To ShortStay Surgical 02/09/16 1106 02/10/16 1117   02/08/16 1915  sulfamethoxazole-trimethoprim  (BACTRIM,SEPTRA) 400-80 MG per tablet 1 tablet     1 tablet Oral Once per day on Mon Wed Fri 02/08/16 1902          Objective:   Filed Vitals:   02/10/16 1034 02/10/16 1049 02/10/16 1104 02/10/16 1118  BP: 141/58 133/62 130/49 153/60  Pulse: 85 82 73 76  Temp: 97.9 F (36.6 C)   97.8 F (36.6 C)  TempSrc:    Oral  Resp: 16 22 24 18   SpO2: 87% 94% 90% 96%    Wt Readings from Last 3 Encounters:  05/27/15 85.186 kg (187 lb 12.8 oz)  03/02/14 77.066 kg (169 lb 14.4 oz)  02/11/14 76.204 kg (168 lb)     Intake/Output Summary (Last 24 hours) at 02/10/16 1808 Last data filed at 02/10/16 1100  Gross per 24 hour  Intake   1100 ml  Output   2000 ml  Net   -900 ml     Physical Exam  Awake Alert, No new F.N deficits, Normal affect Supple Neck,No JVD, No cervical lymphadenopathy appriciated.  Symmetrical Chest wall movement, Good air movement bilaterally, CTAB RRR,No Gallops,Rubs or new Murmurs, +ve B.Sounds, Abd Soft, No tenderness, No organomegaly appriciated, No rebound - guarding or rigidity. No  Clubbing, equal tone    Data Review:    CBC  Recent Labs Lab 02/08/16 1650 02/08/16 1702 02/10/16 0539  WBC 7.9  --  10.5  HGB 12.8 14.3 12.7  HCT 38.8 42.0 39.9  PLT 219  --  203  MCV 91.7  --  93.0  MCH 30.3  --  29.6  MCHC 33.0  --  31.8  RDW 15.7*  --  15.6*  LYMPHSABS 1.7  --   --   MONOABS 0.4  --   --   EOSABS 0.0  --   --   BASOSABS 0.0  --   --     Chemistries  Recent Labs Lab 02/08/16 1650 02/08/16 1702 02/09/16 0510 02/10/16 0539  NA 142 141 144 139  K 4.2 4.1 4.5 3.7  CL 109 109 107 105  CO2 21*  --  28 19*  GLUCOSE 147* 138* 109* 111*  BUN 31* 29* 28* 28*  CREATININE 1.92* 2.00* 2.15* 2.07*  CALCIUM 10.0  --  9.7 9.8   ------------------------------------------------------------------------------------------------------------------ No results for input(s): CHOL, HDL, LDLCALC, TRIG, CHOLHDL, LDLDIRECT in the last 72 hours.  Lab  Results  Component Value Date   HGBA1C 6.2* 03/02/2014   ------------------------------------------------------------------------------------------------------------------ No results for input(s): TSH, T4TOTAL, T3FREE, THYROIDAB in the last 72 hours.  Invalid input(s): FREET3 ------------------------------------------------------------------------------------------------------------------ No results for input(s): VITAMINB12, FOLATE, FERRITIN, TIBC, IRON, RETICCTPCT in the last 72 hours.  Coagulation profile  Recent Labs Lab 02/08/16 1650 02/08/16 1935 02/09/16 0510 02/10/16 0539  INR 2.18* 2.52* 2.30* 1.38    No results for input(s): DDIMER in the last 72 hours.  Cardiac Enzymes  Recent Labs Lab 02/08/16 1935 02/09/16 0040 02/09/16 0616  TROPONINI 0.03 0.04* 0.05*   ------------------------------------------------------------------------------------------------------------------ No results found for: BNP  Inpatient Medications  Scheduled Meds: . acidophilus  1 capsule Oral Daily  . amLODipine  5 mg Oral Daily  . azaTHIOprine  50 mg Oral BID  . cholecalciferol  2,000 Units Oral QPM  . cloNIDine  0.1 mg Oral BID  . colesevelam  1,875 mg Oral BID WC  . cycloSPORINE modified  75 mg Oral BID  . darifenacin  7.5 mg Oral q morning - 10a  . feeding supplement (GLUCERNA SHAKE)  237 mL Oral BID BM  . fenofibrate  54 mg Oral Daily  . FLUoxetine  20 mg Oral Daily  . furosemide  40 mg Oral QPM  . furosemide  80 mg Oral Daily  . insulin aspart  0-9 Units Subcutaneous Q4H  . levothyroxine  75 mcg Oral QAC breakfast  . loratadine  10 mg Oral Daily  . metoprolol succinate  100 mg Oral Daily  . mirabegron ER  25 mg Oral QPM  . mometasone-formoterol  2 puff Inhalation BID  . multivitamin with minerals  1 tablet Oral Daily  . omega-3 acid ethyl esters  1 capsule Oral Daily  . oxybutynin  10 mg Oral QHS  . pantoprazole  40 mg Oral Daily  . predniSONE  5 mg Oral Q breakfast   . sulfamethoxazole-trimethoprim  1 tablet Oral Once per day on Mon Wed Fri   Continuous Infusions: . sodium chloride 75 mL/hr at 02/10/16 1240   PRN Meds:.acetaminophen **OR** acetaminophen, bisacodyl, fentaNYL (SUBLIMAZE) injection, fluticasone, hydrALAZINE, HYDROcodone-acetaminophen, hyoscyamine, ipratropium-albuterol, methocarbamol **OR** methocarbamol (ROBAXIN)  IV, ondansetron, polyethylene glycol  Micro Results Recent Results (from the past 240 hour(s))  Urine culture     Status: Abnormal (Preliminary result)   Collection Time: 02/09/16  2:30 AM  Result Value Ref Range Status   Specimen Description URINE, CATHETERIZED  Final   Special Requests NONE  Final   Culture >=100,000 COLONIES/mL ESCHERICHIA COLI (A)  Final   Report Status PENDING  Incomplete  Surgical pcr screen     Status: None   Collection Time: 02/10/16  3:04 AM  Result Value Ref Range Status   MRSA, PCR NEGATIVE NEGATIVE Final   Staphylococcus aureus NEGATIVE NEGATIVE Final    Comment:        The Xpert SA Assay (FDA approved for NASAL specimens in patients over 66 years of age), is one component of a comprehensive surveillance program.  Test performance  has been validated by [facility] for patients greater than or equal to 53 year old. It is not intended to diagnose infection nor to guide or monitor treatment.     Radiology Reports Dg Chest 1 View  02/08/2016  CLINICAL DATA:  Per EMS_ pt stood to go to the bathroom with her walker and lost her balance today. Pt noted to have shortening and rotation to right leg. No chest complaints. EXAM: CHEST 1 VIEW COMPARISON:  05/26/2015 FINDINGS: Normal cardiac silhouette. Calcified granulomas noted. No effusion, infiltrate pneumothorax. No acute osseous abnormality. IMPRESSION: No acute cardiopulmonary process. Electronically Signed   By: Suzy Bouchard M.D.   On: 02/08/2016 17:35   Dg Pelvis 1-2 Views  02/08/2016  CLINICAL DATA:  Right leg shortening after fall  today. EXAM: PELVIS - 1-2 VIEW COMPARISON:  None. FINDINGS: Moderately angulated fracture involving the right femoral neck is noted. Left hip appears normal. Sacroiliac joints appear normal. IMPRESSION: Moderately angulated right femoral neck fracture. Electronically Signed   By: Marijo Conception, M.D.   On: 02/08/2016 17:39   Ct Head Wo Contrast  02/08/2016  CLINICAL DATA:  Dizziness, head laceration after fall. No loss of consciousness. EXAM: CT HEAD WITHOUT CONTRAST TECHNIQUE: Contiguous axial images were obtained from the base of the skull through the vertex without intravenous contrast. COMPARISON:  CT scan of April 23, 2012. FINDINGS: Status post left occipital craniotomy. Minimal diffuse cortical atrophy is noted. Mild chronic ischemic white matter disease is noted. Stable 2.2 cm hyperdense mass is seen in left posterior fossa. Stable encephalomalacia of left cerebellar hemisphere is noted. There is no evidence of hemorrhage or acute infarction. Ventricular size is within normal limits. No mass effect or midline shift is noted. IMPRESSION: Stable postoperative changes are seen involving the left occipital skull and left cerebellar hemisphere with associated 2.2 cm hyperdense mass most consistent with meningioma. No significant changes noted compared to prior exam. Electronically Signed   By: Marijo Conception, M.D.   On: 02/08/2016 18:35   Pelvis Portable  02/10/2016  CLINICAL DATA:  Status post right hip replacement EXAM: PORTABLE PELVIS 1-2 VIEWS COMPARISON:  02/08/2016 FINDINGS: There is now a right hip replacement identified in satisfactory position. No acute soft tissue or bony abnormality is noted. IMPRESSION: Status post right hip replacement. Electronically Signed   By: Inez Catalina M.D.   On: 02/10/2016 11:10   Dg Hip Operative Unilat With Pelvis Right  02/10/2016  CLINICAL DATA:  Right total hip arthroplasty for right femoral neck fracture EXAM: OPERATIVE RIGHT HIP (WITH PELVIS IF PERFORMED) 2  VIEWS TECHNIQUE: Fluoroscopic spot image(s) were submitted for interpretation post-operatively. COMPARISON:  02/08/2016 pelvic and right femur radiographs FINDINGS: Fluoroscopy time 34 seconds. Two nondiagnostic spot fluoroscopic intraoperative radiographs demonstrate postsurgical changes from right total hip arthroplasty. Right acetabular and right proximal femoral prostheses appear well positioned. IMPRESSION: Intraoperative fluoroscopic guidance for right total hip arthroplasty. Electronically Signed   By: Ilona Sorrel M.D.   On: 02/10/2016 10:06   Dg Femur, Min 2 Views Right  02/08/2016  CLINICAL DATA:  Initial encounter for Per EMS_ pt stood to go to the bathroom with her walker and lost her balance today. Pt noted to have shortening and rotation to right leg. No chest complaints. EXAM: RIGHT FEMUR 2 VIEWS COMPARISON:  Pelvic films of earlier today FINDINGS: Osteopenia. varus angulation involving mid femoral neck fracture. No dislocation. distal femur intact, with joint space narrowing involving the knee. No knee joint effusion. Artifact degradation  about the distal femur. IMPRESSION: Mid femoral neck fracture, as before. Electronically Signed   By: Abigail Miyamoto M.D.   On: 02/08/2016 17:48    Time Spent in minutes  20   Velvet Bathe M.D on 02/10/2016 at 6:08 PM  Between 7am to 7pm - Pager - [phone]  After 7pm go to [email or web address] - password [facility]  [facility] -  Office  [phone]

## 2016-02-10 NOTE — Op Note (Signed)
OPERATIVE REPORT  SURGEON: Rod Can, MD   ASSISTANT: Ky Barban, RNFA.  PREOPERATIVE DIAGNOSIS: Displaced right femoral neck fracture.  POSTOPERATIVE DIAGNOSIS: Displaced right femoral neck fracture.  PROCEDURE: Right total hip arthroplasty, anterior approach.   IMPLANTS: DePuy Tri Lock stem, size 4, std offset. DePuy Pinnacle Cup, size 50 mm. DePuy Altrx liner, size 32 by 50 mm, +4 neutral. DePuy Biolox ceramic head ball, size 32 + 5 mm.  ANESTHESIA:  General  ESTIMATED BLOOD LOSS: 300 mL.  ANTIBIOTICS: 900 mg clindamycin. 1 g vancomycin.  DRAINS: None.  COMPLICATIONS: None.   CONDITION: PACU - hemodynamically stable.  BRIEF CLINICAL NOTE: Patricia [name] is a 82 y.o. female with multiple medical problems including chronic coumadin use and immunosuppressive medications due to kidney transplant. She presented to the hospital with a displaced right femoral neck fracture after a ground level fall. She was admitted to the hospitalist service and underwent perioperative risk stratification and medical optimization. She was initially supratherapeutic on her INR, so we administered vitamin K and waited for her INR to correct. The patient was indicated for total hip arthroplasty. The risks, benefits, and alternatives to the procedure were explained, and the patient elected to proceed.  PROCEDURE IN DETAIL: Surgical site was marked by myself. Spinal anesthesia was obtained in the pre-op holding area. Once inside the operative room, a foley catheter was inserted. The patient was then positioned on the Hana table. All bony prominences were well padded. The hip was prepped and draped in the normal sterile surgical fashion. A time-out was called verifying side and site of surgery. The patient received IV antibiotics within 60 minutes of beginning the procedure.  The direct anterior approach to the hip was performed through the Hueter interval. Lateral femoral circumflex  vessels were treated with the Auqumantys. The anterior capsule was exposed and an inverted T capsulotomy was made.Fracture hematoma was encountered. The subcapital neck fracture was identified. The femoral neck cut was made to the level of the templated cut. A corkscrew was placed into the head and the head was removed. The head was passed to the back table and was measured.  Acetabular exposure was achieved, and the pulvinar and labrum were excised. Sequental reaming of the acetabulum was then performed up to a size 51 mm reamer. A 52 mm cup was then opened and impacted into place at approximately 40 degrees of abduction and 20 degrees of anteversion. The final polyethylene liner was impacted into place and acetabular osteophytes were removed.   I then gained femoral exposure taking care to protect the abductors and greater trochanter. This was performed using standard external rotation, extension, and adduction. The capsule was peeled off the inner aspect of the greater trochanter, taking care to preserve the short external rotators. A cookie cutter was used to enter the femoral canal, and then the femoral canal finder was placed. Sequential broaching was performed up to a size 4. Calcar planer was used on the femoral neck remnant. I placed a std offset neck and a trial head ball. The hip was reduced. Leg lengths and offset were checked fluoroscopically. The hip was dislocated and trial components were removed. The final implants were placed, and the hip was reduced.  Fluoroscopy was used to confirm component position and leg lengths. At 90 degrees of external rotation and full extension, the hip was stable to an anterior directed force.  The wound was copiously irrigated with a dilute betadine solution followed by normal saline. Marcaine solution was injected into  the periarticular soft tissue. The wound was closed in layers using #1 Vicryl and V-Loc for the fascia, 2-0 Vicryl for the  subcutaneous fat, 2-0 Monocryl for the deep dermal layer, 3-0 running Monocryl subcuticular stitch, and Dermabond for the skin. Once the glue was fully dried, an Aquacell Ag dressing was applied. The patient was transported to the recovery room in stable condition. Sponge, needle, and instrument counts were correct at the end of the case x2. The patient tolerated the procedure well and there were no known complications.

## 2016-02-10 NOTE — Anesthesia Procedure Notes (Signed)
Procedure Name: Intubation Date/Time: 02/10/2016 8:02 AM Performed by: Lance Coon Pre-anesthesia Checklist: Timeout performed, Patient identified, Emergency Drugs available, Suction available and Patient being monitored Patient Re-evaluated:Patient Re-evaluated prior to inductionOxygen Delivery Method: Circle system utilized Preoxygenation: Pre-oxygenation with 100% oxygen Intubation Type: IV induction Ventilation: Mask ventilation without difficulty Laryngoscope Size: Miller and 2 Grade View: Grade II Tube type: Oral Tube size: 7.0 mm Number of attempts: 1 Airway Equipment and Method: Stylet Placement Confirmation: ETT inserted through vocal cords under direct vision,  breath sounds checked- equal and bilateral and positive ETCO2 Secured at: 22 cm Dental Injury: Teeth and Oropharynx as per pre-operative assessment

## 2016-02-10 NOTE — Discharge Instructions (Signed)
°Dr. Brandee Markin °Joint Replacement Specialist °[facility] °[address] °[place],  [address] °[phone] ° ° °TOTAL HIP REPLACEMENT POSTOPERATIVE DIRECTIONS ° ° ° °Hip Rehabilitation, Guidelines Following Surgery  ° °WEIGHT BEARING °Weight bearing as tolerated with assist device (walker, cane, etc) as directed, use it as long as suggested by your surgeon or therapist, typically at least 4-6 weeks. ° °The results of a hip operation are greatly improved after range of motion and muscle strengthening exercises. Follow all safety measures which are given to protect your hip. If any of these exercises cause increased pain or swelling in your joint, decrease the amount until you are comfortable again. Then slowly increase the exercises. Call your caregiver if you have problems or questions.  ° °HOME CARE INSTRUCTIONS  °Most of the following instructions are designed to prevent the dislocation of your new hip.  °Remove items at home which could result in a fall. This includes throw rugs or furniture in walking pathways.  °Continue medications as instructed at time of discharge. °· You may have some home medications which will be placed on hold until you complete the course of blood thinner medication. °· You may start showering once you are discharged home. Do not remove your dressing. °Do not put on socks or shoes without following the instructions of your caregivers.   °Sit on chairs with arms. Use the chair arms to help push yourself up when arising.  °Arrange for the use of a toilet seat elevator so you are not sitting low.  °· Walk with walker as instructed.  °You may resume a sexual relationship in one month or when given the OK by your caregiver.  °Use walker as long as suggested by your caregivers.  °You may put full weight on your legs and walk as much as is comfortable. °Avoid periods of inactivity such as sitting longer than an hour when not asleep. This helps prevent  blood clots.  °You may return to work once you are cleared by your surgeon.  °Do not drive a car for 6 weeks or until released by your surgeon.  °Do not drive while taking narcotics.  °Wear elastic stockings for two weeks following surgery during the day but you may remove then at night.  °Make sure you keep all of your appointments after your operation with all of your doctors and caregivers. You should call the office at the above phone number and make an appointment for approximately two weeks after the date of your surgery. °Please pick up a stool softener and laxative for home use as long as you are requiring pain medications. °· ICE to the affected hip every three hours for 30 minutes at a time and then as needed for pain and swelling. Continue to use ice on the hip for pain and swelling from surgery. You may notice swelling that will progress down to the foot and ankle.  This is normal after surgery.  Elevate the leg when you are not up walking on it.   °It is important for you to complete the blood thinner medication as prescribed by your doctor. °· Continue to use the breathing machine which will help keep your temperature down.  It is common for your temperature to cycle up and down following surgery, especially at night when you are not up moving around and exerting yourself.  The breathing machine keeps your lungs expanded and your temperature down. ° °RANGE OF MOTION AND STRENGTHENING EXERCISES  °These exercises are   designed to help you keep full movement of your hip joint. Follow your caregiver's or physical therapist's instructions. Perform all exercises about fifteen times, three times per day or as directed. Exercise both hips, even if you have had only one joint replacement. These exercises can be done on a training (exercise) mat, on the floor, on a table or on a bed. Use whatever works the best and is most comfortable for you. Use music or television while you are exercising so that the exercises  are a pleasant break in your day. This will make your life better with the exercises acting as a break in routine you can look forward to.  °Lying on your back, slowly slide your foot toward your buttocks, raising your knee up off the floor. Then slowly slide your foot back down until your leg is straight again.  °Lying on your back spread your legs as far apart as you can without causing discomfort.  °Lying on your side, raise your upper leg and foot straight up from the floor as far as is comfortable. Slowly lower the leg and repeat.  °Lying on your back, tighten up the muscle in the front of your thigh (quadriceps muscles). You can do this by keeping your leg straight and trying to raise your heel off the floor. This helps strengthen the largest muscle supporting your knee.  °Lying on your back, tighten up the muscles of your buttocks both with the legs straight and with the knee bent at a comfortable angle while keeping your heel on the floor.  ° °SKILLED REHAB INSTRUCTIONS: °If the patient is transferred to a skilled rehab facility following release from the hospital, a list of the current medications will be sent to the facility for the patient to continue.  When discharged from the skilled rehab facility, please have the facility set up the patient's Home Health Physical Therapy prior to being released. Also, the skilled facility will be responsible for providing the patient with their medications at time of release from the facility to include their pain medication and their blood thinner medication. If the patient is still at the rehab facility at time of the two week follow up appointment, the skilled rehab facility will also need to assist the patient in arranging follow up appointment in our office and any transportation needs. ° °MAKE SURE YOU:  °Understand these instructions.  °Will watch your condition.  °Will get help right away if you are not doing well or get worse. ° °Pick up stool softner and  laxative for home use following surgery while on pain medications. °Do not remove your dressing. °The dressing is waterproof--it is OK to take showers. °Continue to use ice for pain and swelling after surgery. °Do not use any lotions or creams on the incision until instructed by your surgeon. °Total Hip Protocol. ° ° °

## 2016-02-10 NOTE — Anesthesia Postprocedure Evaluation (Signed)
Anesthesia Post Note  Patient: Patricia [name]  Procedure(s) Performed: Procedure(s) (LRB): TOTAL HIP ARTHROPLASTY ANTERIOR APPROACH (Right)  Patient location during evaluation: PACU Anesthesia Type: General Level of consciousness: awake and alert Pain management: pain level controlled Vital Signs Assessment: post-procedure vital signs reviewed and stable Respiratory status: spontaneous breathing, nonlabored ventilation, respiratory function stable and patient connected to nasal cannula oxygen Cardiovascular status: blood pressure returned to baseline and stable Postop Assessment: no signs of nausea or vomiting Anesthetic complications: no    Last Vitals:  Filed Vitals:   02/10/16 1104 02/10/16 1118  BP: 130/49 153/60  Pulse: 73 76  Temp:  36.6 C  Resp: 24 18    Last Pain:  Filed Vitals:   02/10/16 1225  PainSc: 2                  Montez Hageman

## 2016-02-10 NOTE — Interval H&P Note (Signed)
History and Physical Interval Note:  02/10/2016 7:26 AM  Patricia [name]  has presented today for surgery, with the diagnosis of Right Femoral Neck Fx  The various methods of treatment have been discussed with the patient and family. After consideration of risks, benefits and other options for treatment, the patient has consented to  Procedure(s): TOTAL HIP ARTHROPLASTY ANTERIOR APPROACH/VS HEMI (Right) as a surgical intervention .  The patient's history has been reviewed, patient examined, no change in status, stable for surgery.  I have reviewed the patient's chart and labs.  Questions were answered to the patient's satisfaction.     [name], Horald Pollen

## 2016-02-10 NOTE — Progress Notes (Addendum)
ANTICOAGULATION CONSULT NOTE - Initial Consult  Pharmacy Consult for coumadin Indication: on coumadin PTA for unprovoked DVT, now s/p R THR  Allergies  Allergen Reactions  . Macrolides And Ketolides Nausea Only  . Aricept [Donepezil Hcl] Nausea And Vomiting  . Codeine Nausea And Vomiting and Other (See Comments)  . Morphine Nausea And Vomiting  . Penicillin G Nausea Only  . Penicillins Nausea And Vomiting and Other (See Comments)    HEADACHE   . Statins Other (See Comments)    Doesn't remember  . Streptomycin Other (See Comments)    headache unknown  . Tetracycline Nausea Only  . Tetracyclines & Related Rash    Patient Measurements:    Vital Signs: Temp: 97.8 F (36.6 C) (04/28 1118) Temp Source: Oral (04/28 1118) BP: 153/60 mmHg (04/28 1118) Pulse Rate: 76 (04/28 1118)  Labs:  Recent Labs  02/08/16 1650 02/08/16 1702 02/08/16 1935 02/09/16 0040 02/09/16 0510 02/09/16 0616 02/10/16 0539  HGB 12.8 14.3  --   --   --   --  12.7  HCT 38.8 42.0  --   --   --   --  39.9  PLT 219  --   --   --   --   --  203  LABPROT 24.1*  --  26.8*  --  25.1*  --  17.1*  INR 2.18*  --  2.52*  --  2.30*  --  1.38  CREATININE 1.92* 2.00*  --   --  2.15*  --  2.07*  TROPONINI  --   --  0.03 0.04*  --  0.05*  --     CrCl cannot be calculated (Unknown ideal weight.).   Medical History: Past Medical History  Diagnosis Date  . Renal disorder   . Hx of kidney transplant     right  . Coronary artery disease   . Hypertension   . Thyroid disease   . Multiple allergies   . Abnormal gait   . Reflux   . Diverticulitis   . Hypothyroidism   . Depression   . Anxiety   . COPD, mild ([place])   . Dizziness   . Diabetes ([place])   . DVT (deep venous thrombosis) [facility])     Assessment: 82 yo F on coumadin PTA for unprovoked DVT, now s/p R THR. She is on bactrim long-term MWF for kidney transplant.  INR 2.52>>1.38 after vitamin K 10 mg x 2 doses for R THR.  Pharmacy consulted to resume  coumadin post-op.  Hg 12.7, PLTC WNL,  No bleeding reported.   Goal of Therapy:  INR 2-3 Monitor platelets by anticoagulation protocol: Yes   Plan:  Coumadin 5 mg po x 1 dose tonight Daily INR LMWH 40 q24 to start 4/29 am per ortho   Eudelia Bunch, Pharm.D. [id number] 02/10/2016 7:09 PM

## 2016-02-10 NOTE — Transfer of Care (Signed)
Immediate Anesthesia Transfer of Care Note  Patient: Patricia [name]  Procedure(s) Performed: Procedure(s): TOTAL HIP ARTHROPLASTY ANTERIOR APPROACH (Right)  Patient Location: PACU  Anesthesia Type:General  Level of Consciousness: awake, alert , oriented and patient cooperative  Airway & Oxygen Therapy: Patient Spontanous Breathing and Patient connected to nasal cannula oxygen  Post-op Assessment: Report given to RN, Post -op Vital signs reviewed and stable and Patient moving all extremities X 4  Post vital signs: Reviewed and stable  Last Vitals:  Filed Vitals:   02/09/16 1957 02/10/16 0504  BP: 138/52 164/59  Pulse: 63 69  Temp: 37.1 C 37.2 C  Resp: 16 16    Last Pain:  Filed Vitals:   02/10/16 0505  PainSc: 8       Patients Stated Pain Goal: 2 ([id number])  Complications: No apparent anesthesia complications

## 2016-02-10 NOTE — H&P (View-Only) (Signed)
Patricia [name] is an 82 y.o. female.    Chief Complaint: right hip pain  HPI: 82 y/o female with a ground level fall earlier today injuring right hip. Pt tripped at home. She states she has a hx of balance issues and this is one reason for her fall. Denies any LOC, chest pain or other injuries than the right hip. Denies any numbness or tingling distally. Husband by the bedside.  PCP:  [name], MD  PMH: Past Medical History  Diagnosis Date  . Renal disorder   . Hx of kidney transplant     right  . Coronary artery disease   . Hypertension   . Thyroid disease   . Multiple allergies   . Abnormal gait   . Reflux   . Diverticulitis   . Hypothyroidism   . Depression   . Anxiety   . COPD, mild ([place])   . Dizziness   . Diabetes ([place])   . DVT (deep venous thrombosis) ([facility])     PSH: Past Surgical History  Procedure Laterality Date  . Nephrectomy transplanted organ    . Tumor removal      Brain     Social History:  reports that she has never smoked. She has never used smokeless tobacco. She reports that she does not drink alcohol or use illicit drugs.  Allergies:  Allergies  Allergen Reactions  . Macrolides And Ketolides Nausea Only  . Aricept [Donepezil Hcl] Nausea And Vomiting  . Codeine Nausea And Vomiting  . Penicillins Other (See Comments)    Has patient had a PCN reaction causing immediate rash, facial/tongue/throat swelling, SOB or lightheadedness with hypotension: No Has patient had a PCN reaction causing severe rash involving mucus membranes or skin necrosis: Yes Has patient had a PCN reaction that required hospitalization No Has patient had a PCN reaction occurring within the last 10 years: No If all of the above answers are "NO", then may proceed with Cephalosporin use.  . Statins Other (See Comments)    Doesn't remember  . Streptomycin Other (See Comments)    unknown  . Tetracyclines & Related Rash    Medications: Current  Facility-Administered Medications  Medication Dose Route Frequency Provider Last Rate Last Dose  . ALIGN CAPS 4 mg  4 mg Oral Daily Timothy S Opyd, MD      . amLODipine (NORVASC) tablet 5 mg  5 mg Oral Daily [name], MD      . azaTHIOprine (IMURAN) tablet 50 mg  50 mg Oral BID [name], MD      . bisacodyl (DULCOLAX) EC tablet 5 mg  5 mg Oral Daily PRN [name], MD      . cloNIDine (CATAPRES) tablet 0.1 mg  0.1 mg Oral BID [name], MD      . Derrill Memo ON 02/09/2016] colesevelam [facility]) tablet 1,875 mg  1,875 mg Oral BID WC Ilene Qua Opyd, MD      . cycloSPORINE modified (NEORAL) capsule 75 mg  75 mg Oral BID [name], MD      . Derrill Memo ON 02/09/2016] darifenacin (ENABLEX) 24 hr tablet 7.5 mg  7.5 mg Oral q morning - 10a Timothy S Opyd, MD      . fenofibrate tablet 54 mg  54 mg Oral Daily Timothy S Opyd, MD      . fentaNYL (SUBLIMAZE) injection 12.5 mcg  12.5 mcg Intravenous Q1H PRN [name], MD      . FLUoxetine (  PROZAC) tablet 20 mg  20 mg Oral Daily Timothy S Opyd, MD      . fluticasone (FLONASE) 50 MCG/ACT nasal spray 1 spray  1 spray Each Nare Daily PRN [name], MD      . furosemide (LASIX) tablet 40 mg  40 mg Oral QPM [name], MD      . Derrill Memo ON 02/09/2016] furosemide (LASIX) tablet 80 mg  80 mg Oral Daily Ilene Qua Opyd, MD      . hydrALAZINE (APRESOLINE) injection 10 mg  10 mg Intravenous Q4H PRN [name], MD      . hyoscyamine (ANASPAZ) disintergrating tablet 0.125 mg  0.125 mg Sublingual Q6H PRN Ilene Qua Opyd, MD      . insulin aspart (novoLOG) injection 0-9 Units  0-9 Units Subcutaneous Q4H Timothy S Opyd, MD      . ipratropium-albuterol (DUONEB) 0.5-2.5 (3) MG/3ML nebulizer solution 3 mL  3 mL Nebulization Q4H PRN [name], MD      . levothyroxine (SYNTHROID, LEVOTHROID) tablet 75 mcg  75 mcg Oral Daily Ilene Qua Opyd, MD      . loratadine (CLARITIN) tablet 10 mg  10 mg Oral Daily [name], MD      . methocarbamol  (ROBAXIN) tablet 500 mg  500 mg Oral Q6H PRN [name], MD       Or  . methocarbamol (ROBAXIN) 500 mg in dextrose 5 % 50 mL IVPB  500 mg Intravenous Q6H PRN [name], MD      . metoprolol succinate (TOPROL-XL) 24 hr tablet 100 mg  100 mg Oral Daily [name], MD      . mirabegron ER (MYRBETRIQ) tablet 25 mg  25 mg Oral QPM Timothy S Opyd, MD      . mometasone-formoterol (DULERA) 200-5 MCG/ACT inhaler 2 puff  2 puff Inhalation BID [name], MD      . multivitamin tablet 1 tablet  1 tablet Oral Daily Ilene Qua Opyd, MD      . omega-3 fish oil (MAXEPA) capsule 1,000 mg  1 capsule Oral Daily Ilene Qua Opyd, MD      . ondansetron (ZOFRAN) tablet 4 mg  4 mg Oral Q8H PRN [name], MD      . oxybutynin (DITROPAN-XL) 24 hr tablet 10 mg  10 mg Oral QHS Timothy S Opyd, MD      . pantoprazole (PROTONIX) EC tablet 40 mg  40 mg Oral Daily Timothy S Opyd, MD      . polyethylene glycol (MIRALAX / GLYCOLAX) packet 17 g  17 g Oral Daily PRN [name], MD      . Derrill Memo ON 02/09/2016] predniSONE (DELTASONE) tablet 5 mg  5 mg Oral Q breakfast Ilene Qua Opyd, MD      . Resveratrol CAPS 100 mg  1 capsule Oral QPM Ilene Qua Opyd, MD      . sulfamethoxazole-trimethoprim (BACTRIM,SEPTRA) 400-80 MG per tablet 1 tablet  1 tablet Oral Once per day on Mon Wed Fri [name], MD      . Vitamin D 2,000 Units  2,000 Units Oral QPM [name], MD       Current Outpatient Prescriptions  Medication Sig Dispense Refill  . albuterol (PROVENTIL HFA;VENTOLIN HFA) 108 (90 BASE) MCG/ACT inhaler Inhale 2 puffs into the lungs every 6 (six) hours as needed for wheezing or shortness of breath. Patient uses this medication prn.    Marland Kitchen  amLODipine (NORVASC) 5 MG tablet Take 5 mg by mouth daily.    Marland Kitchen azaTHIOprine (IMURAN) 50 MG tablet Take 50 mg by mouth 2 (two) times daily.     . budesonide-formoterol (SYMBICORT) 160-4.5 MCG/ACT inhaler Inhale 2 puffs into the lungs daily.     . cetirizine (ZYRTEC) 10 MG  tablet Take 10 mg by mouth every morning.     . Cholecalciferol (VITAMIN D) 2000 UNITS tablet Take 2,000 Units by mouth every evening.     . cloNIDine (CATAPRES) 0.1 MG tablet Take 0.1 mg by mouth 2 (two) times daily.    . Coenzyme Q10 (CO Q 10) 100 MG CAPS Take 1 tablet by mouth every evening.     . colesevelam (WELCHOL) 625 MG tablet Take 1,875 mg by mouth 2 (two) times daily with a meal.    . cycloSPORINE modified (NEORAL) 25 MG capsule Take 2 capsules (50 mg total) by mouth 2 (two) times daily. (Patient taking differently: Take 75 mg by mouth 2 (two) times daily. )    . ENABLEX 7.5 MG 24 hr tablet Take 7.5 mg by mouth every morning.     . fenofibrate (TRICOR) 48 MG tablet Take 48 mg by mouth daily.    Marland Kitchen FLUoxetine (PROZAC) 20 MG tablet Take 20 mg by mouth daily.    . fluticasone (FLONASE) 50 MCG/ACT nasal spray Place 1 spray into both nostrils daily as needed for allergies or rhinitis.    . fosfomycin (MONUROL) 3 G PACK Take 3 g by mouth once. To be taken on 08/22/15 1 packet 0  . furosemide (LASIX) 40 MG tablet Take 2 tablets (80 mg total) by mouth 2 (two) times daily. '80mg'$  in the morning and '40mg'$  in the evening 30 tablet   . hyoscyamine (ANASPAZ) 0.125 MG TBDP Place 0.125 mg under the tongue every 6 (six) hours as needed for bladder spasms. Patient uses this medication prn.    Marland Kitchen L-Lysine 500 MG TABS Take 500 mg by mouth daily.    Marland Kitchen levothyroxine (SYNTHROID, LEVOTHROID) 75 MCG tablet Take 75 mcg by mouth daily.    . metoprolol succinate (TOPROL-XL) 100 MG 24 hr tablet Take 100 mg by mouth daily. Take with or immediately following a meal.    . mirabegron ER (MYRBETRIQ) 25 MG TB24 tablet Take 25 mg by mouth every evening.     . Multiple Vitamin (MULTIVITAMIN) tablet Take 1 tablet by mouth daily.    Marland Kitchen omega-3 fish oil (MAXEPA) 1000 MG CAPS capsule Take 1 capsule by mouth daily.    Marland Kitchen omeprazole (PRILOSEC) 20 MG capsule Take 20 mg by mouth daily.     . ondansetron (ZOFRAN) 4 MG tablet Take 4 mg by  mouth every 8 (eight) hours as needed for nausea or vomiting.    Marland Kitchen oxybutynin (DITROPAN-XL) 10 MG 24 hr tablet Take 10 mg by mouth at bedtime.    . potassium chloride SA (K-DUR,KLOR-CON) 20 MEQ tablet Take 40 mEq by mouth daily.    . predniSONE (DELTASONE) 5 MG tablet Take 5 mg by mouth daily with breakfast.    . Probiotic Product (ALIGN) 4 MG CAPS Take 1 tablet by mouth daily.    Marland Kitchen Resveratrol 100 MG CAPS Take 1 capsule by mouth every evening.     . sulfamethoxazole-trimethoprim (BACTRIM,SEPTRA) 400-80 MG per tablet Take 1 tablet by mouth 3 (three) times a week. Monday, Wednesday, and Friday.    . warfarin (COUMADIN) 3 MG tablet Take 3 mg by mouth daily. Tues,Thurs,Sat,Sun    .  warfarin (COUMADIN) 5 MG tablet Alternate 2.5 mg and 5 mg daily. (Patient not taking: Reported on 08/20/2015) 30 tablet 0  . warfarin (COUMADIN) 5 MG tablet Take 5 mg by mouth daily.      Results for orders placed or performed during the hospital encounter of 02/08/16 (from the past 48 hour(s))  Protime-INR     Status: Abnormal   Collection Time: 02/08/16  4:50 PM  Result Value Ref Range   Prothrombin Time 24.1 (H) 11.6 - 15.2 seconds   INR 2.18 (H) 0.00 - 8.41  Basic metabolic panel     Status: Abnormal   Collection Time: 02/08/16  4:50 PM  Result Value Ref Range   Sodium 142 135 - 145 mmol/L   Potassium 4.2 3.5 - 5.1 mmol/L   Chloride 109 101 - 111 mmol/L   CO2 21 (L) 22 - 32 mmol/L   Glucose, Bld 147 (H) 65 - 99 mg/dL   BUN 31 (H) 6 - 20 mg/dL   Creatinine, Ser 1.92 (H) 0.44 - 1.00 mg/dL   Calcium 10.0 8.9 - 10.3 mg/dL   GFR calc non Af Amer 23 (L) >60 mL/min   GFR calc Af Amer 27 (L) >60 mL/min    Comment: (NOTE) The eGFR has been calculated using the CKD EPI equation. This calculation has not been validated in all clinical situations. eGFR's persistently <60 mL/min signify possible Chronic Kidney Disease.    Anion gap 12 5 - 15  CBC with Differential     Status: Abnormal   Collection Time: 02/08/16   4:50 PM  Result Value Ref Range   WBC 7.9 4.0 - 10.5 K/uL   RBC 4.23 3.87 - 5.11 MIL/uL   Hemoglobin 12.8 12.0 - 15.0 g/dL   HCT 38.8 36.0 - 46.0 %   MCV 91.7 78.0 - 100.0 fL   MCH 30.3 26.0 - 34.0 pg   MCHC 33.0 30.0 - 36.0 g/dL   RDW 15.7 (H) 11.5 - 15.5 %   Platelets 219 150 - 400 K/uL   Neutrophils Relative % 74 %   Neutro Abs 5.8 1.7 - 7.7 K/uL   Lymphocytes Relative 21 %   Lymphs Abs 1.7 0.7 - 4.0 K/uL   Monocytes Relative 5 %   Monocytes Absolute 0.4 0.1 - 1.0 K/uL   Eosinophils Relative 0 %   Eosinophils Absolute 0.0 0.0 - 0.7 K/uL   Basophils Relative 0 %   Basophils Absolute 0.0 0.0 - 0.1 K/uL  I-stat Chem 8, ED     Status: Abnormal   Collection Time: 02/08/16  5:02 PM  Result Value Ref Range   Sodium 141 135 - 145 mmol/L   Potassium 4.1 3.5 - 5.1 mmol/L   Chloride 109 101 - 111 mmol/L   BUN 29 (H) 6 - 20 mg/dL   Creatinine, Ser 2.00 (H) 0.44 - 1.00 mg/dL   Glucose, Bld 138 (H) 65 - 99 mg/dL   Calcium, Ion 1.12 (L) 1.13 - 1.30 mmol/L   TCO2 19 0 - 100 mmol/L   Hemoglobin 14.3 12.0 - 15.0 g/dL   HCT 42.0 36.0 - 46.0 %   Dg Chest 1 View  02/08/2016  CLINICAL DATA:  Per EMS_ pt stood to go to the bathroom with her walker and lost her balance today. Pt noted to have shortening and rotation to right leg. No chest complaints. EXAM: CHEST 1 VIEW COMPARISON:  05/26/2015 FINDINGS: Normal cardiac silhouette. Calcified granulomas noted. No effusion, infiltrate pneumothorax. No acute osseous abnormality.  IMPRESSION: No acute cardiopulmonary process. Electronically Signed   By: Suzy Bouchard M.D.   On: 02/08/2016 17:35   Dg Pelvis 1-2 Views  02/08/2016  CLINICAL DATA:  Right leg shortening after fall today. EXAM: PELVIS - 1-2 VIEW COMPARISON:  None. FINDINGS: Moderately angulated fracture involving the right femoral neck is noted. Left hip appears normal. Sacroiliac joints appear normal. IMPRESSION: Moderately angulated right femoral neck fracture. Electronically Signed   By:  Marijo Conception, M.D.   On: 02/08/2016 17:39   Ct Head Wo Contrast  02/08/2016  CLINICAL DATA:  Dizziness, head laceration after fall. No loss of consciousness. EXAM: CT HEAD WITHOUT CONTRAST TECHNIQUE: Contiguous axial images were obtained from the base of the skull through the vertex without intravenous contrast. COMPARISON:  CT scan of April 23, 2012. FINDINGS: Status post left occipital craniotomy. Minimal diffuse cortical atrophy is noted. Mild chronic ischemic white matter disease is noted. Stable 2.2 cm hyperdense mass is seen in left posterior fossa. Stable encephalomalacia of left cerebellar hemisphere is noted. There is no evidence of hemorrhage or acute infarction. Ventricular size is within normal limits. No mass effect or midline shift is noted. IMPRESSION: Stable postoperative changes are seen involving the left occipital skull and left cerebellar hemisphere with associated 2.2 cm hyperdense mass most consistent with meningioma. No significant changes noted compared to prior exam. Electronically Signed   By: Marijo Conception, M.D.   On: 02/08/2016 18:35   Dg Femur, Min 2 Views Right  02/08/2016  CLINICAL DATA:  Initial encounter for Per EMS_ pt stood to go to the bathroom with her walker and lost her balance today. Pt noted to have shortening and rotation to right leg. No chest complaints. EXAM: RIGHT FEMUR 2 VIEWS COMPARISON:  Pelvic films of earlier today FINDINGS: Osteopenia. varus angulation involving mid femoral neck fracture. No dislocation. distal femur intact, with joint space narrowing involving the knee. No knee joint effusion. Artifact degradation about the distal femur. IMPRESSION: Mid femoral neck fracture, as before. Electronically Signed   By: Abigail Miyamoto M.D.   On: 02/08/2016 17:48    ROS: ROS Has balance issues by her report Lives at home with husband  Physical Exam: Alert and appropriate 82 y/o female in no acute distress Cervical spine with full rom and no  tenderness Bilateral upper extremities with full rom, no tenderness, nv intact distally Left lower extremity full rom no tenderness Right lower extremity with shortening and external rotation No signs of open fracture nv intact distally Not taken thru any rom with the right lower extremity No rashes  Mild edema distally Physical Exam   Assessment/Plan Assessment: right femur fracture  Plan: Pt to be admitted to medical team. Once INR improves and she has been cleared recommend surgery to provide pain relief and start recovery. Plan for surgery tomorrow vs Friday She may eat tonight but NPO after midnight.  Pain management as needed Strict bedrest with foley

## 2016-02-11 DIAGNOSIS — N183 Chronic kidney disease, stage 3 (moderate): Secondary | ICD-10-CM

## 2016-02-11 LAB — BASIC METABOLIC PANEL
ANION GAP: 12 (ref 5–15)
BUN: 32 mg/dL — ABNORMAL HIGH (ref 6–20)
CALCIUM: 8.9 mg/dL (ref 8.9–10.3)
CHLORIDE: 108 mmol/L (ref 101–111)
CO2: 19 mmol/L — AB (ref 22–32)
Creatinine, Ser: 2.76 mg/dL — ABNORMAL HIGH (ref 0.44–1.00)
GFR calc non Af Amer: 15 mL/min — ABNORMAL LOW (ref >60)
GFR, EST AFRICAN AMERICAN: 17 mL/min — AB (ref >60)
Glucose, Bld: 122 mg/dL — ABNORMAL HIGH (ref 65–99)
Potassium: 4.3 mmol/L (ref 3.5–5.1)
Sodium: 139 mmol/L (ref 135–145)

## 2016-02-11 LAB — GLUCOSE, CAPILLARY
GLUCOSE-CAPILLARY: 164 mg/dL — AB (ref 65–99)
GLUCOSE-CAPILLARY: 156 mg/dL — AB (ref 65–99)
Glucose-Capillary: 132 mg/dL — ABNORMAL HIGH (ref 65–99)
Glucose-Capillary: 135 mg/dL — ABNORMAL HIGH (ref 65–99)
Glucose-Capillary: 152 mg/dL — ABNORMAL HIGH (ref 65–99)
Glucose-Capillary: 162 mg/dL — ABNORMAL HIGH (ref 65–99)

## 2016-02-11 LAB — CBC
HEMATOCRIT: 31.5 % — AB (ref 36.0–46.0)
HEMOGLOBIN: 9.6 g/dL — AB (ref 12.0–15.0)
MCH: 29.1 pg (ref 26.0–34.0)
MCHC: 30.5 g/dL (ref 30.0–36.0)
MCV: 95.5 fL (ref 78.0–100.0)
Platelets: 168 10*3/uL (ref 150–400)
RBC: 3.3 MIL/uL — ABNORMAL LOW (ref 3.87–5.11)
RDW: 16 % — ABNORMAL HIGH (ref 11.5–15.5)
WBC: 9.5 10*3/uL (ref 4.0–10.5)

## 2016-02-11 LAB — PROTIME-INR
INR: 1.43 (ref 0.00–1.49)
PROTHROMBIN TIME: 17.6 s — AB (ref 11.6–15.2)

## 2016-02-11 LAB — HEMOGLOBIN A1C
HEMOGLOBIN A1C: 6.5 % — AB (ref 4.8–5.6)
Mean Plasma Glucose: 140 mg/dL

## 2016-02-11 MED ORDER — INSULIN ASPART 100 UNIT/ML ~~LOC~~ SOLN
0.0000 [IU] | Freq: Three times a day (TID) | SUBCUTANEOUS | Status: DC
  Administered 2016-02-12 – 2016-02-14 (×6): 1 [IU] via SUBCUTANEOUS

## 2016-02-11 MED ORDER — WARFARIN SODIUM 3 MG PO TABS
3.0000 mg | ORAL_TABLET | Freq: Once | ORAL | Status: AC
  Administered 2016-02-11: 3 mg via ORAL
  Filled 2016-02-11: qty 1

## 2016-02-11 NOTE — Progress Notes (Signed)
Subjective: 1 Day Post-Op Procedure(s) (LRB): TOTAL HIP ARTHROPLASTY ANTERIOR APPROACH (Right) Patient reports pain as mild to right hip.  tolerating PO's. Denies SOB, CP, or calf pain.  Objective: Vital signs in last 24 hours: Temp:  [97.8 F (36.6 C)-98.5 F (36.9 C)] 98.5 F (36.9 C) (04/29 0300) Pulse Rate:  [73-98] 98 (04/29 0300) Resp:  [16-24] 16 (04/29 0300) BP: (130-153)/(49-102) 144/80 mmHg (04/29 0300) SpO2:  [87 %-98 %] 98 % (04/29 0300)  Intake/Output from previous day: 04/28 0701 - 04/29 0700 In: 1100 [I.V.:1100] Out: 1300 [Urine:1050; Blood:250] Intake/Output this shift:     Recent Labs  02/08/16 1650 02/08/16 1702 02/10/16 0539 02/10/16 2011 02/11/16 0349  HGB 12.8 14.3 12.7 10.0* 9.6*    Recent Labs  02/10/16 2011 02/11/16 0349  WBC 11.1* 9.5  RBC 3.29* 3.30*  HCT 31.6* 31.5*  PLT 171 168    Recent Labs  02/10/16 0539 02/10/16 2011 02/11/16 0349  NA 139  --  139  K 3.7  --  4.3  CL 105  --  108  CO2 19*  --  19*  BUN 28*  --  32*  CREATININE 2.07* 2.65* 2.76*  GLUCOSE 111*  --  122*  CALCIUM 9.8  --  8.9    Recent Labs  02/10/16 0539 02/11/16 0349  INR 1.38 1.43    Well nourished. Alert and oriented x3. RRR, Lungs clear, BS x4. Abdomen soft and non tender. Right Calf soft and non tender. Right hip dressing C/D/I. No DVT signs. Compartment soft. No signs of infection.  Right LE grossly neurovascular intact.  Assessment/Plan: 1 Day Post-Op Procedure(s) (LRB): TOTAL HIP ARTHROPLASTY ANTERIOR APPROACH (Right) Up with PT WBAT On lovenox bridging to Coumadin Hx of Kidney transplant On medicine service D/c planning   Patricia [name] L 02/11/2016, 8:27 AM

## 2016-02-11 NOTE — Evaluation (Signed)
Occupational Therapy Evaluation Patient Details Name: Patricia [name] MRN: [id number] DOB: [birth year] Today's Date: 02/11/2016    History of Present Illness Pt admitted with R hip fx as a result of a fall. s/p anterior hip replacement. PMH: CKD, DVT, COPD, UTIs, CHF, arthritis, tremor.   Clinical Impression   Pt was assisted for LB dressing, shower transfers and all IADL prior to admission. She walks with a rollator, but is unsteady. Pt currently requires 2 person assist for all mobility and was not able to ambulate this visit. She needs extensive assist for bathing, dressing and toileting. Will follow acutely. Recommending SNF for continued rehab.   Follow Up Recommendations  SNF;Supervision/Assistance - 24 hour    Equipment Recommendations       Recommendations for Other Services       Precautions / Restrictions Precautions Precautions: Fall Restrictions Weight Bearing Restrictions: Yes RLE Weight Bearing: Weight bearing as tolerated      Mobility Bed Mobility Overal bed mobility: +2 for physical assistance;Needs Assistance Bed Mobility: Supine to Sit     Supine to sit: +2 for physical assistance;Total assist     General bed mobility comments: assist for all aspects, used pad to pivot pt to EOB  Transfers Overall transfer level: Needs assistance Equipment used: 2 person hand held assist Transfers: Sit to/from Stand;Stand Pivot Transfers Sit to Stand: +2 physical assistance;Max assist Stand pivot transfers: +2 physical assistance;Max assist            Balance Overall balance assessment: Needs assistance   Sitting balance-Leahy Scale: Poor   Postural control: Right lateral lean   Standing balance-Leahy Scale: Zero                              ADL Overall ADL's : Needs assistance/impaired Eating/Feeding: Set up;Sitting   Grooming: Wash/dry hands;Wash/dry face;Sitting;Set up   Upper Body Bathing: Maximal assistance;Sitting   Lower Body  Bathing: Total assistance;+2 for physical assistance;Sit to/from stand   Upper Body Dressing : Maximal assistance;Sitting   Lower Body Dressing: Total assistance;+2 for physical assistance;Sit to/from stand   Toilet Transfer: +2 for physical assistance;Maximal assistance;Stand-pivot Armed forces technical officer Details (indicate cue type and reason): simulated to chair                 Vision     Perception     Praxis      Pertinent Vitals/Pain Pain Assessment: Faces Faces Pain Scale: Hurts little more Pain Location: R hip Pain Descriptors / Indicators: Grimacing;Moaning Pain Intervention(s): Monitored during session     Hand Dominance Right   Extremity/Trunk Assessment Upper Extremity Assessment Upper Extremity Assessment: Generalized weakness   Lower Extremity Assessment Lower Extremity Assessment: Defer to PT evaluation       Communication Communication Communication: HOH (minimal verbalization)   Cognition Arousal/Alertness: Lethargic Behavior During Therapy: Flat affect Overall Cognitive Status: History of cognitive impairments - at baseline                     General Comments       Exercises       Shoulder Instructions      Home Living Family/patient expects to be discharged to:: Skilled nursing facility                             Home Equipment: Gilford Rile - 4 wheels;Bedside commode;Shower seat;Grab bars - tub/shower;Transport chair  Prior Functioning/Environment Level of Independence: Needs assistance  Gait / Transfers Assistance Needed: Using Rollator for ambulation ADL's / Homemaking Assistance Needed: Husband assists with bathing and lower body dressing.  Husband performs all IADL's.        OT Diagnosis: Generalized weakness;Cognitive deficits;Acute pain   OT Problem List: Decreased strength;Decreased activity tolerance;Impaired balance (sitting and/or standing);Decreased cognition;Decreased safety awareness;Decreased  knowledge of use of DME or AE;Pain   OT Treatment/Interventions: Self-care/ADL training;DME and/or AE instruction;Therapeutic activities;Patient/family education;Balance training    OT Goals(Current goals can be found in the care plan section) Acute Rehab OT Goals Patient Stated Goal: agreeable for rehab OT Goal Formulation: With patient Time For Goal Achievement: 02/25/16 Potential to Achieve Goals: Fair ADL Goals Pt Will Perform Grooming: with supervision;sitting Pt Will Perform Upper Body Bathing: sitting;with min assist Pt Will Perform Upper Body Dressing: with min assist;sitting Pt Will Transfer to Toilet: with +2 assist;with min assist;stand pivot transfer;bedside commode Additional ADL Goal #1: Pt will sit EOB with supervision x 10 minutes while engaged in ADL.  OT Frequency: Min 2X/week   Barriers to D/C:            Co-evaluation PT/OT/SLP Co-Evaluation/Treatment: Yes Reason for Co-Treatment: For patient/therapist safety   OT goals addressed during session: ADL's and self-care      End of Session Equipment Utilized During Treatment: Gait belt;Oxygen Nurse Communication: Mobility status  Activity Tolerance: Patient tolerated treatment well Patient left: in chair;with call bell/phone within reach;with chair alarm set;with family/visitor present   Time: 1528-1600 OT Time Calculation (min): 32 min Charges:  OT General Charges $OT Visit: 1 Procedure OT Evaluation $OT Eval Moderate Complexity: 1 Procedure G-Codes:    Malka So 02/11/2016, 5:09 PM  [phone]

## 2016-02-11 NOTE — Evaluation (Signed)
Physical Therapy Evaluation Patient Details Name: SAPHYRA BOST MRN: [id number] DOB: [birth year] Today's Date: 02/11/2016   History of Present Illness  Pt admitted with R hip fx as a result of a fall. s/p anterior hip replacement. PMH: CKD, DVT, COPD, UTIs, CHF, arthritis, tremor.  Clinical Impression  Patient presents with problems listed below.  Will benefit from acute PT to maximize functional mobility prior to discharge.  Patient requiring +2 max assist for mobility, and unable to attempt gait today.  Recommend SNF at d/c for continued therapy.    Follow Up Recommendations SNF;Supervision/Assistance - 24 hour    Equipment Recommendations  None recommended by PT    Recommendations for Other Services       Precautions / Restrictions Precautions Precautions: Fall Precaution Comments: Fall at home - not using rollator Restrictions Weight Bearing Restrictions: Yes RLE Weight Bearing: Weight bearing as tolerated      Mobility  Bed Mobility Overal bed mobility: Needs Assistance;+2 for physical assistance Bed Mobility: Supine to Sit     Supine to sit: +2 for physical assistance;Total assist     General bed mobility comments: assist for all aspects, used pad to pivot pt to EOB.  In sitting, patient with strong lean to right.  Worked on shifting weight to left to maintain balance.  Transfers Overall transfer level: Needs assistance Equipment used: 2 person hand held assist Transfers: Sit to/from [facility] Sit to Stand: +2 physical assistance;Max assist Stand pivot transfers: +2 physical assistance;Max assist       General transfer comment: Verbal and tactile cues to initiate movement.  Use of bed pad to assist with transfer to standing.  Patient unable to take steps in stance.  Required +2 max assist to pivot to chair.   Ambulation/Gait                Stairs            Wheelchair Mobility    Modified Rankin (Stroke Patients Only)        Balance Overall balance assessment: Needs assistance Sitting-balance support: Feet supported Sitting balance-Leahy Scale: Poor Sitting balance - Comments: Required assist to maintain sitting balance. Postural control: Right lateral lean   Standing balance-Leahy Scale: Zero                               Pertinent Vitals/Pain Pain Assessment: Faces Faces Pain Scale: Hurts little more Pain Location: Rt hip Pain Descriptors / Indicators: Grimacing;Moaning (during transfer) Pain Intervention(s): Monitored during session;Repositioned    Home Living Family/patient expects to be discharged to:: Skilled nursing facility               Home Equipment: Gilford Rile - 4 wheels;Bedside commode;Shower seat;Grab bars - tub/shower;Transport chair      Prior Function Level of Independence: Needs assistance   Gait / Transfers Assistance Needed: Using Rollator for ambulation  ADL's / Homemaking Assistance Needed: Husband assists with bathing and lower body dressing.  Husband performs all IADL's.        Hand Dominance   Dominant Hand: Right    Extremity/Trunk Assessment   Upper Extremity Assessment: Defer to OT evaluation           Lower Extremity Assessment: Generalized weakness;RLE deficits/detail RLE Deficits / Details: Decreased strength and ROM due to pain/surgery.  Strength grossly 2-/5       Communication   Communication: HOH (minimal verbalizations)  Cognition Arousal/Alertness: Lethargic Behavior  During Therapy: Flat affect Overall Cognitive Status: History of cognitive impairments - at baseline                      General Comments      Exercises        Assessment/Plan    PT Assessment Patient needs continued PT services  PT Diagnosis Difficulty walking;Generalized weakness;Acute pain;Altered mental status   PT Problem List Decreased strength;Decreased activity tolerance;Decreased balance;Decreased mobility;Decreased  cognition;Decreased knowledge of use of DME;Decreased knowledge of precautions;Pain  PT Treatment Interventions DME instruction;Gait training;Functional mobility training;Therapeutic activities;Therapeutic exercise;Cognitive remediation;Patient/family education   PT Goals (Current goals can be found in the Care Plan section) Acute Rehab PT Goals Patient Stated Goal: agreeable for rehab PT Goal Formulation: With patient/family Time For Goal Achievement: 02/25/16 Potential to Achieve Goals: Good    Frequency 7X/week   Barriers to discharge        Co-evaluation PT/OT/SLP Co-Evaluation/Treatment: Yes Reason for Co-Treatment: For patient/therapist safety PT goals addressed during session: Mobility/safety with mobility OT goals addressed during session: ADL's and self-care       End of Session Equipment Utilized During Treatment: Gait belt;Oxygen Activity Tolerance: Patient limited by fatigue;Patient limited by lethargy Patient left: in chair;with call bell/phone within reach;with chair alarm set;with family/visitor present Nurse Communication: Mobility status         Time: [id number] PT Time Calculation (min) (ACUTE ONLY): 23 min   Charges:   PT Evaluation $PT Eval Moderate Complexity: 1 Procedure     PT G CodesDespina Pole 2016/03/02, 5:59 PM Carita Pian. Sanjuana Kava, [place] Pager [phone]

## 2016-02-11 NOTE — Care Management Note (Addendum)
Case Management Note  Patient Details  Name: KIAUNA ZYWICKI MRN: [id number] Date of Birth: [birth year]   Subjective/Objective: 82 yo F fell and sustained a R femoral neck fracture. s/p R THA, anterior approach.            Action/Plan: PT is recommending SNF   Expected Discharge Date:                  Expected Discharge Plan:  [place]  In-House Referral:  Clinical Social Work  Discharge planning Services  CM Consult  Post Acute Care Choice:    Choice offered to:     DME Arranged:    DME Agency:     HH Arranged:    [place] Agency:     Status of Service:  In process, will continue to follow  Medicare Important Message Given:    Date Medicare IM Given:    Medicare IM give by:    Date Additional Medicare IM Given:    Additional Medicare Important Message give by:     If discussed at [place] of Stay Meetings, dates discussed:    Additional Comments: met with pt and husband at bedside. They agreed with SNF placement. They prefer [facility] facility because is near their home. SW referral ordered.  Norina Buzzard, RN 02/11/2016, 4:27 PM

## 2016-02-11 NOTE — Progress Notes (Signed)
PROGRESS NOTE                                                                                                                                                                                                             Patient Demographics:    Patricia [name], is a 82 y.o. female, DOB - 1932/02/23, [id number]  Admit date - 02/08/2016   Admitting Physician [name], MD  Outpatient Primary MD for the patient is Patricia Argyle, MD  LOS - 3  Outpatient Specialists: [facility]  Chief Complaint  Patient presents with  . Fall       Brief Narrative   82 y.o. female with medical history significant for renal transplant with CKD 3, DVT, COPD, recurrent UTIs, and chronic diastolic CHF who presents to the ED with right hip and occipital head pain following a mechanical fall at home. Patient reports that she was in her usual state of health with no recent fevers, chills, lightheadedness, or dizziness when she suffered a mechanical fall at home today, striking the back of her head on the ground. Patient is anticoagulated with Coumadin for an unprovoked DVT. She has gait problems that have been evaluated by neurology and attributed to arthritis and resulting pain. She has been using a walker and was walking several steps without the walker today when she fell backwards. There was no loss of consciousness, no nausea or vomiting, and no confusion, focal numbness or weakness, or loss of coordination. Prior to the fall, she was in her usual state of health.  ED Course: Upon arrival to the ED, patient is found to be saturating well on room air and with vital signs stable. Head CT was obtained and negative for acute intracranial abnormality. EKG demonstrates an arrhythmia, possibly atrial fibrillation. Chest x-ray is negative for acute cardiopulmonary disease but radiographs of the hip demonstrate a moderately angulated right femoral  neck fracture   Subjective:    [name] Has no new complaints reported today   Assessment  & Plan :    Principal Problem:   Closed right hip fracture [facility]) - Orthopedic surgeon on board and assisting with management - Continue supportive therapy - Awaiting physical therapy recommendations  UTI - continue current antibiotics - f/u with urine cx  Active Problems:   Hx of kidney transplant - continue current regimen  and monitor    Hypothyroidism - stable    Depression - continue prozac    Anxiety   COPD (chronic obstructive pulmonary disease) (HCC) - stable currently    Diabetes ([place]) - diabetic diet - continue SSI    DVT, lower extremity, proximal ([place]) - INR too high for surgical intervention for hip fracture repair. INR trending down after vitamin K - Pharmacy to manage coumadin    CKD (chronic kidney disease) stage 3, GFR 30-59 ml/min - serum creatinine trending up. Hold lasix - reassess next am    Chronic diastolic CHF (congestive heart failure) (HCC) - serum creatinine trending up as such will hold lasix  Overactive bladder - will d/c enablex continue oxybutinin  Code Status : Full  Family Communication  : Discussed directly with patient  Disposition Plan  : Pending recommendations by specialist  Barriers For Discharge : awaiting PT recommendations for disposition.  Consults  :  Orthopedic surgeon  Procedures  : Please refer to ortho's notes  DVT Prophylaxis  :  Elevated INR secondary to history of, patient on warfarin as outpatient  Lab Results  Component Value Date   PLT 168 02/11/2016    Antibiotics  :  Clindamycin Bactrim vancomycin  Anti-infectives    Start     Dose/Rate Route Frequency Ordered Stop   02/11/16 0600  vancomycin (VANCOCIN) IVPB 1000 mg/200 mL premix     1,000 mg 200 mL/hr over 60 Minutes Intravenous Every 12 hours 02/10/16 1854 02/11/16 0642   02/10/16 0700  clindamycin (CLEOCIN) IVPB 900 mg     900 mg 100 mL/hr  over 30 Minutes Intravenous To ShortStay Surgical 02/09/16 1106 02/10/16 0806   02/10/16 0630  vancomycin (VANCOCIN) IVPB 1000 mg/200 mL premix  Status:  Discontinued     1,000 mg 200 mL/hr over 60 Minutes Intravenous To ShortStay Surgical 02/09/16 1106 02/10/16 1117   02/08/16 1915  sulfamethoxazole-trimethoprim (BACTRIM,SEPTRA) 400-80 MG per tablet 1 tablet     1 tablet Oral Once per day on Mon Wed Fri 02/08/16 1902          Objective:   Filed Vitals:   02/10/16 2106 02/11/16 0300 02/11/16 0840 02/11/16 1320  BP: 140/102 144/80  130/54  Pulse: 96 98  97  Temp: 98.4 F (36.9 C) 98.5 F (36.9 C)  98.1 F (36.7 C)  TempSrc: Oral Oral  Axillary  Resp: 18 16  17   SpO2: 97% 98% 95% 96%    Wt Readings from Last 3 Encounters:  05/27/15 85.186 kg (187 lb 12.8 oz)  03/02/14 77.066 kg (169 lb 14.4 oz)  02/11/14 76.204 kg (168 lb)     Intake/Output Summary (Last 24 hours) at 02/11/16 1445 Last data filed at 02/11/16 0700  Gross per 24 hour  Intake      0 ml  Output    650 ml  Net   -650 ml     Physical Exam  Awake Alert, No new F.N deficits, Normal affect Supple Neck,No JVD, No cervical lymphadenopathy appriciated.  Symmetrical Chest wall movement, Good air movement bilaterally, CTAB RRR,No Gallops,Rubs or new Murmurs, +ve B.Sounds, Abd Soft, No tenderness, No organomegaly appriciated, No rebound - guarding or rigidity. No  Clubbing, equal tone    Data Review:    CBC  Recent Labs Lab 02/08/16 1650 02/08/16 1702 02/10/16 0539 02/10/16 2011 02/11/16 0349  WBC 7.9  --  10.5 11.1* 9.5  HGB 12.8 14.3 12.7 10.0* 9.6*  HCT 38.8 42.0 39.9 31.6* 31.5*  PLT 219  --  203 171 168  MCV 91.7  --  93.0 96.0 95.5  MCH 30.3  --  29.6 30.4 29.1  MCHC 33.0  --  31.8 31.6 30.5  RDW 15.7*  --  15.6* 15.8* 16.0*  LYMPHSABS 1.7  --   --   --   --   MONOABS 0.4  --   --   --   --   EOSABS 0.0  --   --   --   --   BASOSABS 0.0  --   --   --   --     Chemistries   Recent  Labs Lab 02/08/16 1650 02/08/16 1702 02/09/16 0510 02/10/16 0539 02/10/16 2011 02/11/16 0349  NA 142 141 144 139  --  139  K 4.2 4.1 4.5 3.7  --  4.3  CL 109 109 107 105  --  108  CO2 21*  --  28 19*  --  19*  GLUCOSE 147* 138* 109* 111*  --  122*  BUN 31* 29* 28* 28*  --  32*  CREATININE 1.92* 2.00* 2.15* 2.07* 2.65* 2.76*  CALCIUM 10.0  --  9.7 9.8  --  8.9   ------------------------------------------------------------------------------------------------------------------ No results for input(s): CHOL, HDL, LDLCALC, TRIG, CHOLHDL, LDLDIRECT in the last 72 hours.  Lab Results  Component Value Date   HGBA1C 6.5* 02/08/2016   ------------------------------------------------------------------------------------------------------------------ No results for input(s): TSH, T4TOTAL, T3FREE, THYROIDAB in the last 72 hours.  Invalid input(s): FREET3 ------------------------------------------------------------------------------------------------------------------ No results for input(s): VITAMINB12, FOLATE, FERRITIN, TIBC, IRON, RETICCTPCT in the last 72 hours.  Coagulation profile  Recent Labs Lab 02/08/16 1650 02/08/16 1935 02/09/16 0510 02/10/16 0539 02/11/16 0349  INR 2.18* 2.52* 2.30* 1.38 1.43    No results for input(s): DDIMER in the last 72 hours.  Cardiac Enzymes  Recent Labs Lab 02/08/16 1935 02/09/16 0040 02/09/16 0616  TROPONINI 0.03 0.04* 0.05*   ------------------------------------------------------------------------------------------------------------------ No results found for: BNP  Inpatient Medications  Scheduled Meds: . acidophilus  1 capsule Oral Daily  . azaTHIOprine  50 mg Oral BID  . cholecalciferol  2,000 Units Oral QPM  . cloNIDine  0.1 mg Oral BID  . colesevelam  1,875 mg Oral BID WC  . cycloSPORINE modified  100 mg Oral BID  . darifenacin  7.5 mg Oral q morning - 10a  . docusate sodium  100 mg Oral BID  . enoxaparin (LOVENOX)  injection  40 mg Subcutaneous Q24H  . feeding supplement (GLUCERNA SHAKE)  237 mL Oral BID BM  . fenofibrate  54 mg Oral Daily  . FLUoxetine  20 mg Oral Daily  . insulin aspart  0-9 Units Subcutaneous Q4H  . levothyroxine  75 mcg Oral QAC breakfast  . loratadine  10 mg Oral Daily  . metoprolol succinate  100 mg Oral Daily  . mirabegron ER  25 mg Oral QPM  . mometasone-formoterol  2 puff Inhalation BID  . multivitamin with minerals  1 tablet Oral Daily  . omega-3 acid ethyl esters  1 capsule Oral Daily  . oxybutynin  10 mg Oral QHS  . pantoprazole  40 mg Oral Daily  . predniSONE  5 mg Oral Q breakfast  . senna  1 tablet Oral BID  . sulfamethoxazole-trimethoprim  1 tablet Oral Once per day on Mon Wed Fri  . warfarin  3 mg Oral ONCE-1800  . Warfarin - Pharmacist Dosing Inpatient   Does not apply q1800   Continuous Infusions: . sodium chloride 75  mL/hr at 02/10/16 1240   PRN Meds:.acetaminophen **OR** acetaminophen, bisacodyl, fluticasone, hydrALAZINE, HYDROcodone-acetaminophen, HYDROmorphone (DILAUDID) injection, hyoscyamine, ipratropium-albuterol, magnesium citrate, menthol-cetylpyridinium **OR** phenol, methocarbamol **OR** methocarbamol (ROBAXIN)  IV, metoCLOPramide **OR** metoCLOPramide (REGLAN) injection, ondansetron **OR** ondansetron (ZOFRAN) IV, polyethylene glycol, sorbitol  Micro Results Recent Results (from the past 240 hour(s))  Urine culture     Status: Abnormal (Preliminary result)   Collection Time: 02/09/16  2:30 AM  Result Value Ref Range Status   Specimen Description URINE, CATHETERIZED  Final   Special Requests NONE  Final   Culture (A)  Final    >=100,000 COLONIES/mL ESCHERICHIA COLI SUSCEPTIBILITIES TO FOLLOW    Report Status PENDING  Incomplete  Surgical pcr screen     Status: None   Collection Time: 02/10/16  3:04 AM  Result Value Ref Range Status   MRSA, PCR NEGATIVE NEGATIVE Final   Staphylococcus aureus NEGATIVE NEGATIVE Final    Comment:        The  Xpert SA Assay (FDA approved for NASAL specimens in patients over 56 years of age), is one component of a comprehensive surveillance program.  Test performance has been validated by [facility] for patients greater than or equal to 43 year old. It is not intended to diagnose infection nor to guide or monitor treatment.     Radiology Reports Dg Chest 1 View  02/08/2016  CLINICAL DATA:  Per EMS_ pt stood to go to the bathroom with her walker and lost her balance today. Pt noted to have shortening and rotation to right leg. No chest complaints. EXAM: CHEST 1 VIEW COMPARISON:  05/26/2015 FINDINGS: Normal cardiac silhouette. Calcified granulomas noted. No effusion, infiltrate pneumothorax. No acute osseous abnormality. IMPRESSION: No acute cardiopulmonary process. Electronically Signed   By: Suzy Bouchard M.D.   On: 02/08/2016 17:35   Dg Pelvis 1-2 Views  02/08/2016  CLINICAL DATA:  Right leg shortening after fall today. EXAM: PELVIS - 1-2 VIEW COMPARISON:  None. FINDINGS: Moderately angulated fracture involving the right femoral neck is noted. Left hip appears normal. Sacroiliac joints appear normal. IMPRESSION: Moderately angulated right femoral neck fracture. Electronically Signed   By: Marijo Conception, M.D.   On: 02/08/2016 17:39   Ct Head Wo Contrast  02/08/2016  CLINICAL DATA:  Dizziness, head laceration after fall. No loss of consciousness. EXAM: CT HEAD WITHOUT CONTRAST TECHNIQUE: Contiguous axial images were obtained from the base of the skull through the vertex without intravenous contrast. COMPARISON:  CT scan of April 23, 2012. FINDINGS: Status post left occipital craniotomy. Minimal diffuse cortical atrophy is noted. Mild chronic ischemic white matter disease is noted. Stable 2.2 cm hyperdense mass is seen in left posterior fossa. Stable encephalomalacia of left cerebellar hemisphere is noted. There is no evidence of hemorrhage or acute infarction. Ventricular size is within normal  limits. No mass effect or midline shift is noted. IMPRESSION: Stable postoperative changes are seen involving the left occipital skull and left cerebellar hemisphere with associated 2.2 cm hyperdense mass most consistent with meningioma. No significant changes noted compared to prior exam. Electronically Signed   By: Marijo Conception, M.D.   On: 02/08/2016 18:35   Pelvis Portable  02/10/2016  CLINICAL DATA:  Status post right hip replacement EXAM: PORTABLE PELVIS 1-2 VIEWS COMPARISON:  02/08/2016 FINDINGS: There is now a right hip replacement identified in satisfactory position. No acute soft tissue or bony abnormality is noted. IMPRESSION: Status post right hip replacement. Electronically Signed   By: Linus Mako.D.  On: 02/10/2016 11:10   Dg Hip Operative Unilat With Pelvis Right  02/10/2016  CLINICAL DATA:  Right total hip arthroplasty for right femoral neck fracture EXAM: OPERATIVE RIGHT HIP (WITH PELVIS IF PERFORMED) 2 VIEWS TECHNIQUE: Fluoroscopic spot image(s) were submitted for interpretation post-operatively. COMPARISON:  02/08/2016 pelvic and right femur radiographs FINDINGS: Fluoroscopy time 34 seconds. Two nondiagnostic spot fluoroscopic intraoperative radiographs demonstrate postsurgical changes from right total hip arthroplasty. Right acetabular and right proximal femoral prostheses appear well positioned. IMPRESSION: Intraoperative fluoroscopic guidance for right total hip arthroplasty. Electronically Signed   By: Ilona Sorrel M.D.   On: 02/10/2016 10:06   Dg Femur, Min 2 Views Right  02/08/2016  CLINICAL DATA:  Initial encounter for Per EMS_ pt stood to go to the bathroom with her walker and lost her balance today. Pt noted to have shortening and rotation to right leg. No chest complaints. EXAM: RIGHT FEMUR 2 VIEWS COMPARISON:  Pelvic films of earlier today FINDINGS: Osteopenia. varus angulation involving mid femoral neck fracture. No dislocation. distal femur intact, with joint space  narrowing involving the knee. No knee joint effusion. Artifact degradation about the distal femur. IMPRESSION: Mid femoral neck fracture, as before. Electronically Signed   By: Abigail Miyamoto M.D.   On: 02/08/2016 17:48    Time Spent in minutes  20   Velvet Bathe M.D on 02/11/2016 at 2:45 PM  Between 7am to 7pm - Pager - [phone]  After 7pm go to [email or web address] - password [facility]  [facility] -  Office  [phone]

## 2016-02-11 NOTE — Progress Notes (Signed)
ANTICOAGULATION CONSULT NOTE - Initial Consult  Pharmacy Consult for coumadin Indication: on coumadin PTA for unprovoked DVT, now s/p R THR  Allergies  Allergen Reactions  . Macrolides And Ketolides Nausea Only  . Aricept [Donepezil Hcl] Nausea And Vomiting  . Codeine Nausea And Vomiting  . Morphine Nausea And Vomiting  . Penicillin G Nausea Only  . Penicillins Nausea And Vomiting and Other (See Comments)    HEADACHE   . Statins Other (See Comments)    Doesn't remember  . Streptomycin Other (See Comments)    headache   . Tetracycline Nausea Only  . Tetracyclines & Related Rash    Patient Measurements: Weight : 85.2 kg on 05/27/15     Vital Signs: Temp: 98.1 F (36.7 C) (04/29 1320) Temp Source: Axillary (04/29 1320) BP: 130/54 mmHg (04/29 1320) Pulse Rate: 97 (04/29 1320)  Labs:  Recent Labs  02/08/16 1935 02/09/16 0040 02/09/16 0510 02/09/16 0616 02/10/16 0539 02/10/16 2011 02/11/16 0349  HGB  --   --   --   --  12.7 10.0* 9.6*  HCT  --   --   --   --  39.9 31.6* 31.5*  PLT  --   --   --   --  203 171 168  LABPROT 26.8*  --  25.1*  --  17.1*  --  17.6*  INR 2.52*  --  2.30*  --  1.38  --  1.43  CREATININE  --   --  2.15*  --  2.07* 2.65* 2.76*  TROPONINI 0.03 0.04*  --  0.05*  --   --   --     CrCl cannot be calculated (Unknown ideal weight.).   Medical History: Past Medical History  Diagnosis Date  . Renal disorder   . Hx of kidney transplant     right  . Coronary artery disease   . Hypertension   . Thyroid disease   . Multiple allergies   . Abnormal gait   . Reflux   . Diverticulitis   . Hypothyroidism   . Depression   . Anxiety   . COPD, mild ([place])   . Dizziness   . Diabetes ([place])   . DVT (deep venous thrombosis) [facility])     Assessment: 82 yo F on coumadin PTA for unprovoked DVT, now s/p R THR. She is on bactrim long-term MWF for kidney transplant.  INR 2.52>>1.38 after vitamin K 10 mg x 2 doses for R THR.  Pharmacy consulted to  resume coumadin post-op.  POD#1 s/p Right THA. INR on 4/29 = 1.43 (2.52>2.3>vit k x2 doses>>1.38>1.43) after 5mg  coumadin on 4/28 -PTA Bactrim SS 1tab qMWF resumed(for h/o kidney transplant)- monitor for DDI bactrim & warf. Hgb 14.3>12.7>10>9.6 post op. No bleeding noted. Denies SOB, CP, or calf pain. No DVT signs per ortho MD. On lovenox 40mg  q24h bridging per Ortho to Coumadin  Medication reconcilation PTA was completed today 4/29 with pt's spouse: Pta coumadin dose: Coumadin 1mg : spouse reports on 4/29 to med rec tech: Take 4 tablets (4 mg) by mouth 1st day, then take 3 tablets (3 mg) for 2-3 days, then repeat. Spouse could not remember exactly how many days for the 3 mg dose. INR was therapeutic 2.52 on admit.   Goal of Therapy:  INR 2-3 Monitor platelets by anticoagulation protocol: Yes   Plan:  Coumadin 3 mg po x 1 dose tonight Daily INR Continues LMWH 40 q24 to start 4/29 am per ortho   Nicole Cella, RPh  Clinical Pharmacist Pager: [phone] 02/11/2016 2:30 PM

## 2016-02-12 DIAGNOSIS — N3000 Acute cystitis without hematuria: Secondary | ICD-10-CM

## 2016-02-12 DIAGNOSIS — E038 Other specified hypothyroidism: Secondary | ICD-10-CM

## 2016-02-12 LAB — GLUCOSE, CAPILLARY
GLUCOSE-CAPILLARY: 123 mg/dL — AB (ref 65–99)
GLUCOSE-CAPILLARY: 131 mg/dL — AB (ref 65–99)
GLUCOSE-CAPILLARY: 115 mg/dL — AB (ref 65–99)
GLUCOSE-CAPILLARY: 119 mg/dL — AB (ref 65–99)

## 2016-02-12 LAB — PROTIME-INR
INR: 1.69 — ABNORMAL HIGH (ref 0.00–1.49)
Prothrombin Time: 19.9 seconds — ABNORMAL HIGH (ref 11.6–15.2)

## 2016-02-12 LAB — CBC
HEMATOCRIT: 26.2 % — AB (ref 36.0–46.0)
HEMOGLOBIN: 8.4 g/dL — AB (ref 12.0–15.0)
MCH: 30.5 pg (ref 26.0–34.0)
MCHC: 32.1 g/dL (ref 30.0–36.0)
MCV: 95.3 fL (ref 78.0–100.0)
Platelets: 148 10*3/uL — ABNORMAL LOW (ref 150–400)
RBC: 2.75 MIL/uL — ABNORMAL LOW (ref 3.87–5.11)
RDW: 15.8 % — AB (ref 11.5–15.5)
WBC: 7.1 10*3/uL (ref 4.0–10.5)

## 2016-02-12 LAB — BASIC METABOLIC PANEL
Anion gap: 8 (ref 5–15)
BUN: 36 mg/dL — AB (ref 6–20)
CHLORIDE: 106 mmol/L (ref 101–111)
CO2: 22 mmol/L (ref 22–32)
CREATININE: 2.66 mg/dL — AB (ref 0.44–1.00)
Calcium: 9.1 mg/dL (ref 8.9–10.3)
GFR calc Af Amer: 18 mL/min — ABNORMAL LOW (ref >60)
GFR calc non Af Amer: 16 mL/min — ABNORMAL LOW (ref >60)
Glucose, Bld: 128 mg/dL — ABNORMAL HIGH (ref 65–99)
Potassium: 4.3 mmol/L (ref 3.5–5.1)
Sodium: 136 mmol/L (ref 135–145)

## 2016-02-12 LAB — URINE CULTURE: Culture: >=100000 — AB

## 2016-02-12 MED ORDER — WARFARIN SODIUM 3 MG PO TABS
3.0000 mg | ORAL_TABLET | Freq: Once | ORAL | Status: AC
  Administered 2016-02-12: 3 mg via ORAL
  Filled 2016-02-12: qty 1

## 2016-02-12 NOTE — Progress Notes (Signed)
Physical Therapy Treatment Patient Details Name: Patricia [name] MRN: [id number] DOB: 1932-07-06 Today's Date: 02/12/2016    History of Present Illness Pt admitted with R hip fx as a result of a fall. s/p anterior hip replacement. PMH: CKD, DVT, COPD, UTIs, CHF, arthritis, tremor.    PT Comments    Pt alert with flat affect, aware of day and time but not surgery or fall preceding admission. Pt continues to require extensive assist for all mobility and any movement of RLE. Pt and spouse educated for HEP and encouraged her to perform throughout the day. Pt continues to require max-total assist and still not yet able to attempt ambulation, discussed with pt/son/spouse. Will continue to follow.  Follow Up Recommendations  SNF;Supervision/Assistance - 24 hour     Equipment Recommendations       Recommendations for Other Services       Precautions / Restrictions Precautions Precautions: Fall Restrictions RLE Weight Bearing: Weight bearing as tolerated    Mobility  Bed Mobility Overal bed mobility: Needs Assistance Bed Mobility: Rolling Rolling: Max assist         General bed mobility comments: max assist to roll bil to change linens and perform pericare due to urinary incontinence. pt unable to significantly assist and max assist with bed in trendelenburg to scoot to [facility]. Did not attempt further transfers as +2 not available  Transfers                    Ambulation/Gait                 Stairs            Wheelchair Mobility    Modified Rankin (Stroke Patients Only)       Balance                                    Cognition Arousal/Alertness: Awake/alert Behavior During Therapy: Flat affect Overall Cognitive Status: History of cognitive impairments - at baseline                      Exercises Total Joint Exercises Short Arc Quad: AAROM;AROM;Left;Right;10 reps;Supine (AAROM on RLE) Heel Slides: PROM;AROM;Right;Left;10  reps;Supine (PROM on RLE) Hip ABduction/ADduction: AROM;PROM;Right;Left;10 reps;Supine (PROM on RLE)    General Comments        Pertinent Vitals/Pain Pain Assessment: Faces Faces Pain Scale: Hurts even more Pain Location: Rt hip with movement Pain Intervention(s): Limited activity within patient's tolerance;Repositioned    Home Living                      Prior Function            PT Goals (current goals can now be found in the care plan section) Progress towards PT goals: Progressing toward goals (very slowly limited by pain, cognition and weakness)    Frequency  Min 3X/week    PT Plan Current plan remains appropriate;Frequency needs to be updated    Co-evaluation             End of Session   Activity Tolerance: Patient tolerated treatment well Patient left: in bed;with call bell/phone within reach;with family/visitor present     Time: [id number] PT Time Calculation (min) (ACUTE ONLY): 26 min  Charges:  $Therapeutic Exercise: 8-22 mins $Therapeutic Activity: 8-22 mins  G CodesMelford Aase 29-Feb-2016, 2:01 PM Elwyn Reach, [place]

## 2016-02-12 NOTE — Progress Notes (Signed)
Pharmacy Antibiotic Note  Patricia [name] is a 82 y.o. female admitted on 02/08/2016 with ESBL UTI.  Pharmacy has been consulted for imipenem dosing.  Plan: Imipenem 250mg  IV q12h  Monitor renal function, clinical course and LOT  Height: 5\' 5"  (165.1 cm) Weight: 173 lb 8 oz (78.699 kg) IBW/kg (Calculated) : 57  Temp (24hrs), Avg:98.6 F (37 C), Min:98.4 F (36.9 C), Max:99.1 F (37.3 C)   Recent Labs Lab 02/08/16 1650  02/09/16 0510 02/10/16 0539 02/10/16 2011 02/11/16 0349 02/12/16 0334  WBC 7.9  --   --  10.5 11.1* 9.5 7.1  CREATININE 1.92*  < > 2.15* 2.07* 2.65* 2.76* 2.66*  < > = values in this interval not displayed.  Estimated Creatinine Clearance: 17.2 mL/min (by C-G formula based on Cr of 2.66).    Allergies  Allergen Reactions  . Macrolides And Ketolides Nausea Only  . Aricept [Donepezil Hcl] Nausea And Vomiting  . Codeine Nausea And Vomiting  . Morphine Nausea And Vomiting  . Penicillin G Nausea Only  . Penicillins Nausea And Vomiting and Other (See Comments)    HEADACHE   . Statins Other (See Comments)    Doesn't remember  . Streptomycin Other (See Comments)    headache   . Tetracycline Nausea Only  . Tetracyclines & Related Rash    Antimicrobials this admission: Imipenem 4/30 >>    >>   Dose adjustments this admission:   Microbiology results:  BCx:  4/27 UCx: >100k Ecoli ESBL   Sputum:   4/28 MRSA PCR: neg  Andrey Cota. Diona Foley, PharmD, BCPS Clinical Pharmacist Pager [phone] 02/12/2016 4:59 PM

## 2016-02-12 NOTE — Progress Notes (Signed)
Unable to set bed alarm - appears broken. Notified charge Therapist, sports and NA

## 2016-02-12 NOTE — NC FL2 (Signed)
[place] MEDICAID FL2 LEVEL OF CARE SCREENING TOOL     IDENTIFICATION  Patient Name: Patricia [name] Birthdate: March 15, 1932 Sex: female Admission Date (Current Location): 02/08/2016  [facility] and [place] Number:  Herbalist and Address:  [facility], [employer] [address] [place], [place] [address]      Provider Number: [id number]  Attending Physician Name and Address:  Velvet Bathe, MD  Relative Name and Phone Number:       Current Level of Care: Hospital Recommended Level of Care: [place] Prior Approval Number:    Date Approved/Denied:   PASRR Number: [id number]  Discharge Plan: SNF    Current Diagnoses: Patient Active Problem List   Diagnosis Date Noted  . Displaced fracture of right femoral neck ([place]) 02/10/2016  . Chronic diastolic CHF (congestive heart failure) ([place]) 02/08/2016  . Closed right hip fracture ([place]) 02/08/2016  . Hip fracture ([place]) 02/08/2016  . Acute diastolic CHF (congestive heart failure) ([place]) 05/27/2015  . Urinary incontinence 05/27/2015  . Escherichia coli urinary tract infection   . Other emphysema ([place])   . Hypokalemia   . Hypomagnesemia   . Sepsis secondary to UTI ([place]) 05/22/2015  . DVT, lower extremity, proximal ([place]) 03/02/2014  . CKD (chronic kidney disease) stage 3, GFR 30-59 ml/min 03/02/2014  . Unspecified essential hypertension 03/02/2014  . DVT (deep venous thrombosis) ([place]) 03/01/2014  . Acute renal failure superimposed on stage 3 chronic kidney disease ([place]) 03/01/2014  . Gastroenteritis 11/22/2013  . Nausea & vomiting 11/22/2013  . Hypercalcemia 11/22/2013  . Infection due to ESBL-producing Escherichia coli 10/07/2013  . Septicemia due to E. coli ([place]) 10/07/2013  . Urinary tract infection 10/05/2013  . Sepsis ([place]) 10/05/2013  . History of renal transplant   . Hx of kidney transplant   . Hypertension   . Thyroid disease   . Multiple allergies   . Abnormal gait   . Reflux    . Diverticulitis   . Hypothyroidism   . Depression   . Anxiety   . COPD (chronic obstructive pulmonary disease) ([place])   . Dizziness   . Diabetes ([place])     Orientation RESPIRATION BLADDER Height & Weight     Self, Time, Situation, Place  O2 (2L) Continent Weight:   Height:     BEHAVIORAL SYMPTOMS/MOOD NEUROLOGICAL BOWEL NUTRITION STATUS      Continent Diet (Renal/Carb Modified)  AMBULATORY STATUS COMMUNICATION OF NEEDS Skin   Extensive Assist Verbally Surgical wounds                       Personal Care Assistance Level of Assistance  Bathing, Feeding, Dressing Bathing Assistance: Maximum assistance Feeding assistance: Limited assistance Dressing Assistance: Maximum assistance     Functional Limitations Info  Sight, Hearing, Speech Sight Info: Adequate Hearing Info: Adequate Speech Info: Adequate    SPECIAL CARE FACTORS FREQUENCY  PT (By licensed PT), OT (By licensed OT)                    Contractures Contractures Info: Not present    Additional Factors Info  Code Status, Allergies, Isolation Precautions, Insulin Sliding Scale, Psychotropic Code Status Info: Full Allergies Info: Macrolides And Ketolides, Aricept, Codeine, Morphine, Penicillin G, Penicillins, Statins, Streptomycin, Tetracycline, Tetracyclines & Related Psychotropic Info: Prozac Insulin Sliding Scale Info: Sliding Scale Isolation Precautions Info: ESBL     Current Medications (02/12/2016):  This is the current hospital active medication list Current Facility-Administered  Medications  Medication Dose Route Frequency Provider Last Rate Last Dose  . 0.9 %  sodium chloride infusion   Intravenous Continuous Rod Can, MD 75 mL/hr at 02/10/16 1240    . acetaminophen (TYLENOL) tablet 650 mg  650 mg Oral Q6H PRN Rod Can, MD       Or  . acetaminophen (TYLENOL) suppository 650 mg  650 mg Rectal Q6H PRN Rod Can, MD      . acidophilus (RISAQUAD) capsule 1 capsule  1 capsule  Oral Daily [name], MD   1 capsule at 02/12/16 0936  . azaTHIOprine (IMURAN) tablet 50 mg  50 mg Oral BID [name], MD   50 mg at 02/12/16 0936  . bisacodyl (DULCOLAX) EC tablet 5 mg  5 mg Oral Daily PRN [name], MD      . cholecalciferol (VITAMIN D) tablet 2,000 Units  2,000 Units Oral QPM [name], MD   2,000 Units at 02/11/16 1743  . cloNIDine (CATAPRES) tablet 0.1 mg  0.1 mg Oral BID [name], MD   0.1 mg at 02/12/16 [id number]  . colesevelam [facility]) tablet 1,875 mg  1,875 mg Oral BID WC [name], MD   1,875 mg at 02/12/16 0934  . cycloSPORINE modified (NEORAL) capsule 100 mg  100 mg Oral BID Velvet Bathe, MD   100 mg at 02/12/16 0936  . docusate sodium (COLACE) capsule 100 mg  100 mg Oral BID Rod Can, MD   100 mg at 02/12/16 [id number]  . enoxaparin (LOVENOX) injection 40 mg  40 mg Subcutaneous Q24H Rod Can, MD   40 mg at 02/12/16 0935  . feeding supplement (GLUCERNA SHAKE) (GLUCERNA SHAKE) liquid 237 mL  237 mL Oral BID BM Velvet Bathe, MD   237 mL at 02/12/16 0954  . fenofibrate tablet 54 mg  54 mg Oral Daily [name], MD   54 mg at 02/12/16 [id number]  . FLUoxetine (PROZAC) capsule 20 mg  20 mg Oral Daily [name], MD   20 mg at 02/12/16 [id number]  . fluticasone (FLONASE) 50 MCG/ACT nasal spray 1 spray  1 spray Each Nare Daily PRN Ilene Qua Opyd, MD      . hydrALAZINE (APRESOLINE) injection 10 mg  10 mg Intravenous Q4H PRN [name], MD      . HYDROcodone-acetaminophen (NORCO/VICODIN) 5-325 MG per tablet 1-2 tablet  1-2 tablet Oral Q6H PRN Rod Can, MD   1 tablet at 02/11/16 2148  . HYDROmorphone (DILAUDID) injection 0.1 mg  0.1 mg Intravenous Q2H PRN Rod Can, MD      . hyoscyamine (ANASPAZ) disintergrating tablet 0.125 mg  0.125 mg Sublingual Q6H PRN Ilene Qua Opyd, MD      . insulin aspart (novoLOG) injection 0-9 Units  0-9 Units Subcutaneous TID WC Jeryl Columbia, NP   1 Units at 02/12/16 [phone]  . ipratropium-albuterol  (DUONEB) 0.5-2.5 (3) MG/3ML nebulizer solution 3 mL  3 mL Nebulization Q4H PRN Ilene Qua Opyd, MD      . levothyroxine (SYNTHROID, LEVOTHROID) tablet 75 mcg  75 mcg Oral QAC breakfast [name], MD   75 mcg at 02/12/16 0935  . loratadine (CLARITIN) tablet 10 mg  10 mg Oral Daily [name], MD   10 mg at 02/12/16 [id number]  . magnesium citrate solution 1 Bottle  1 Bottle Oral Once PRN Rod Can, MD      . menthol-cetylpyridinium (CEPACOL) lozenge 3 mg  1 lozenge Oral  PRN Rod Can, MD       Or  . phenol (CHLORASEPTIC) mouth spray 1 spray  1 spray Mouth/Throat PRN Rod Can, MD      . methocarbamol (ROBAXIN) tablet 500 mg  500 mg Oral Q6H PRN [name], MD   500 mg at 02/08/16 2354   Or  . methocarbamol (ROBAXIN) 500 mg in dextrose 5 % 50 mL IVPB  500 mg Intravenous Q6H PRN [name], MD      . metoCLOPramide (REGLAN) tablet 5-10 mg  5-10 mg Oral Q8H PRN Rod Can, MD       Or  . metoCLOPramide (REGLAN) injection 5-10 mg  5-10 mg Intravenous Q8H PRN Rod Can, MD      . metoprolol succinate (TOPROL-XL) 24 hr tablet 100 mg  100 mg Oral Daily [name], MD   100 mg at 02/12/16 [id number]  . mirabegron ER (MYRBETRIQ) tablet 25 mg  25 mg Oral QPM [name], MD   25 mg at 02/11/16 1744  . mometasone-formoterol (DULERA) 200-5 MCG/ACT inhaler 2 puff  2 puff Inhalation BID [name], MD   2 puff at 02/12/16 0949  . multivitamin with minerals tablet 1 tablet  1 tablet Oral Daily [name], MD   1 tablet at 02/12/16 [phone]  . omega-3 acid ethyl esters (LOVAZA) capsule 1 g  1 capsule Oral Daily [name], MD   1 g at 02/12/16 [phone]  . ondansetron (ZOFRAN) tablet 4 mg  4 mg Oral Q6H PRN Rod Can, MD   4 mg at 02/11/16 1300   Or  . ondansetron (ZOFRAN) injection 4 mg  4 mg Intravenous Q6H PRN Rod Can, MD      . oxybutynin (DITROPAN-XL) 24 hr tablet 10 mg  10 mg Oral QHS [name], MD   10 mg at 02/11/16 2147  . pantoprazole (PROTONIX)  EC tablet 40 mg  40 mg Oral Daily [name], MD   40 mg at 02/12/16 [id number]  . polyethylene glycol (MIRALAX / GLYCOLAX) packet 17 g  17 g Oral Daily PRN Ilene Qua Opyd, MD      . predniSONE (DELTASONE) tablet 5 mg  5 mg Oral Q breakfast [name], MD   5 mg at 02/12/16 0934  . senna (SENOKOT) tablet 8.6 mg  1 tablet Oral BID Rod Can, MD   8.6 mg at 02/12/16 [id number]  . sorbitol 70 % solution 30 mL  30 mL Oral Daily PRN Rod Can, MD      . sulfamethoxazole-trimethoprim (BACTRIM,SEPTRA) 400-80 MG per tablet 1 tablet  1 tablet Oral Once per day on Mon Wed Fri [name], MD   1 tablet at 02/08/16 2351  . warfarin (COUMADIN) tablet 3 mg  3 mg Oral ONCE-1800 Velvet Bathe, MD      . Warfarin - Pharmacist Dosing Inpatient   Does not apply [place], [facility]         Discharge Medications: Please see discharge summary for a list of discharge medications.  Relevant Imaging Results:  Relevant Lab Results:   Additional Information    Boone Master, LCSW

## 2016-02-12 NOTE — Clinical Social Work Placement (Signed)
   CLINICAL SOCIAL WORK PLACEMENT  NOTE  Date:  02/12/2016  Patient Details  Name: Patricia [name] MRN: [id number] Date of Birth: 1932/04/18  Clinical Social Work is seeking post-discharge placement for this patient at the [place] level of care (*CSW will initial, date and re-position this form in  chart as items are completed):  Yes   Patient/family provided with [place] Work Department's list of facilities offering this level of care within the geographic area requested by the patient (or if unable, by the patient's family).  Yes   Patient/family informed of their freedom to choose among providers that offer the needed level of care, that participate in Medicare, Medicaid or managed care program needed by the patient, have an available bed and are willing to accept the patient.  Yes   Patient/family informed of [facility]'s ownership interest in [facility] and [facility], as well as of the fact that they are under no obligation to receive care at these facilities.  PASRR submitted to EDS on       PASRR number received on       Existing PASRR number confirmed on 02/12/16     FL2 transmitted to all facilities in geographic area requested by pt/family on 02/12/16     FL2 transmitted to all facilities within larger geographic area on       Patient informed that his/her managed care company has contracts with or will negotiate with certain facilities, including the following:          Patient/family informed of bed offers received.  Patient chooses bed at       Physician recommends and patient chooses bed at      Patient to be transferred to   on  .  Patient to be transferred to facility by       Patient family notified on   of transfer.  Name of family member notified:        PHYSICIAN       Additional Comment:    _______________________________________________ Boone Master, [place] 02/12/2016, 11:26 AM

## 2016-02-12 NOTE — Progress Notes (Signed)
ANTICOAGULATION CONSULT NOTE - Initial Consult  Pharmacy Consult for coumadin Indication: on coumadin PTA for unprovoked DVT, now s/p R THR  Allergies  Allergen Reactions  . Macrolides And Ketolides Nausea Only  . Aricept [Donepezil Hcl] Nausea And Vomiting  . Codeine Nausea And Vomiting  . Morphine Nausea And Vomiting  . Penicillin G Nausea Only  . Penicillins Nausea And Vomiting and Other (See Comments)    HEADACHE   . Statins Other (See Comments)    Doesn't remember  . Streptomycin Other (See Comments)    headache   . Tetracycline Nausea Only  . Tetracyclines & Related Rash    Patient Measurements: Weight : 85.2 kg on 05/27/15     Vital Signs: Temp: 98.4 F (36.9 C) (04/30 0432) Temp Source: Oral (04/30 0432) BP: 125/52 mmHg (04/30 0432) Pulse Rate: 74 (04/30 0432)  Labs:  Recent Labs  02/10/16 0539 02/10/16 2011 02/11/16 0349 02/12/16 0334  HGB 12.7 10.0* 9.6* 8.4*  HCT 39.9 31.6* 31.5* 26.2*  PLT 203 171 168 148*  LABPROT 17.1*  --  17.6* 19.9*  INR 1.38  --  1.43 1.69*  CREATININE 2.07* 2.65* 2.76* 2.66*    CrCl cannot be calculated (Unknown ideal weight.).   Medical History: Past Medical History  Diagnosis Date  . Renal disorder   . Hx of kidney transplant     right  . Coronary artery disease   . Hypertension   . Thyroid disease   . Multiple allergies   . Abnormal gait   . Reflux   . Diverticulitis   . Hypothyroidism   . Depression   . Anxiety   . COPD, mild ([place])   . Dizziness   . Diabetes ([place])   . DVT (deep venous thrombosis) [facility])     Assessment: 82 yo F on coumadin PTA for unprovoked DVT, now s/p R THR. She is on bactrim long-term MWF for kidney transplant.  INR 2.52>>1.38 after vitamin K 10 mg x 2 doses for R THR.  Pharmacy consulted to resume coumadin post-op.  POD#2 s/p Right THA. INR on 4/29 = 1.69 (2.52>2.3>vit k x2 doses>>1.38>1.43). Vit K 4/30 -PTA Bactrim SS 1tab qMWF resumed(for h/o kidney transplant)- monitor for  DDI bactrim & warf. Hgb 14.3>12.7>10>9.6 post op. No bleeding noted. Denies SOB, CP, or calf pain. No DVT signs per ortho MD. On lovenox 40mg  q24h bridging per Ortho to Coumadin  Medication reconcilation PTA was completed today 4/29 with pt's spouse: Pta coumadin dose: Coumadin 1mg : spouse reports on 4/29 to med rec tech: Take 4 tablets (4 mg) by mouth 1st day, then take 3 tablets (3 mg) for 2-3 days, then repeat. Spouse could not remember exactly how many days for the 3 mg dose. INR was therapeutic 2.52 on admit.  Goal of Therapy:  INR 2-3 Monitor platelets by anticoagulation protocol: Yes   Plan:   Coumadin 3 mg po x 1 dose tonight Daily INR Continues LMWH 40 q24 to start 4/29 am per ortho  Onnie Boer, PharmD Pager: [phone] 02/12/2016 8:38 AM

## 2016-02-12 NOTE — Clinical Social Work Note (Signed)
Clinical Social Work Assessment  Patient Details  Name: Patricia [name] MRN: [id number] Date of Birth: 08-01-1932  Date of referral:  02/12/16               Reason for consult:  Discharge Planning                Permission sought to share information with:  Family Supports Permission granted to share information::  Yes, Verbal Permission Granted  Name::     Designer, multimedia::     Relationship::  husband  Contact Information:     Housing/Transportation Living arrangements for the past 2 months:  Single Family Home Source of Information:  Patient Patient Interpreter Needed:  None Criminal Activity/Legal Involvement Pertinent to Current Situation/Hospitalization:  No - Comment as needed Significant Relationships:  Spouse Lives with:  Spouse Do you feel safe going back to the place where you live?  Yes Need for family participation in patient care:  Yes (Comment)  Care giving concerns:  Pt plans to attend SNF for rehab.   Social Worker assessment / plan:  CSW met with pt and husband at bedside and introduced myself and explained role. Pt reports she is interested in SNF at [place] and prefers [facility]. CSW provided information for local SNFs and explained process. Pt agreeable to [facility] search but remains hopeful for [facility] since it is close to home. Husband aware and agreeable to plans as well.  FL2 completed and faxed out to [facility]. CSW will follow up with bed offers.  Employment status:  Retired Nurse, adult PT Recommendations:  [place] / Referral to community resources:  [place]  Patient/Family's Response to care:  Pt alert and oriented and engaged in assessment.  Patient/Family's Understanding of and Emotional Response to Diagnosis, Current Treatment, and Prognosis:  Pt and family appreciative of visit and hopeful for SNF close to home so that husband of 49 years can visit  often.  Emotional Assessment Appearance:  Appears stated age Attitude/Demeanor/Rapport:  Other (Pleasant) Affect (typically observed):  Appropriate Orientation:  Oriented to Self, Oriented to Place, Oriented to  Time, Oriented to Situation Alcohol / Substance use:  Not Applicable Psych involvement (Current and /or in the community):  No (Comment)  Discharge Needs  Concerns to be addressed:  No discharge needs identified Readmission within the last 30 days:  No Current discharge risk:  None Barriers to Discharge:  No Barriers Identified   Boone Master, [place] 02/12/2016, 11:24 AM Weekend Coverage

## 2016-02-12 NOTE — Progress Notes (Signed)
Subjective: 2 Days Post-Op Procedure(s) (LRB): TOTAL HIP ARTHROPLASTY ANTERIOR APPROACH (Right) Patient reports pain as mild.  Pt toleratgin regular diet.  No c/o.  OOB with therapy yesterday.  Husband at bedside.  Objective: Vital signs in last 24 hours: Temp:  [98.1 F (36.7 C)-99.1 F (37.3 C)] 98.4 F (36.9 C) (04/30 0432) Pulse Rate:  [72-97] 72 (04/30 0934) Resp:  [12-17] 16 (04/30 0432) BP: (123-131)/(41-54) 127/46 mmHg (04/30 0934) SpO2:  [96 %-100 %] 99 % (04/30 0432)  Intake/Output from previous day:   Intake/Output this shift:     Recent Labs  02/10/16 0539 02/10/16 2011 02/11/16 0349 02/12/16 0334  HGB 12.7 10.0* 9.6* 8.4*    Recent Labs  02/11/16 0349 02/12/16 0334  WBC 9.5 7.1  RBC 3.30* 2.75*  HCT 31.5* 26.2*  PLT 168 148*    Recent Labs  02/11/16 0349 02/12/16 0334  NA 139 136  K 4.3 4.3  CL 108 106  CO2 19* 22  BUN 32* 36*  CREATININE 2.76* 2.66*  GLUCOSE 122* 128*  CALCIUM 8.9 9.1    Recent Labs  02/11/16 0349 02/12/16 0334  INR 1.43 1.69*    PE:  elderly woman in nad.  a and O.  Resp unlabored on O2 by [place].  R hip wound dressed and dry.  NVI at [place].  Assessment/Plan: 2 Days Post-Op Procedure(s) (LRB): TOTAL HIP ARTHROPLASTY ANTERIOR APPROACH (Right) Up with therapy WBAT.  Continue anticoag.    Wylene Simmer 02/12/2016, 9:42 AM

## 2016-02-12 NOTE — Progress Notes (Signed)
PROGRESS NOTE                                                                                                                                                                                                             Patient Demographics:    Patricia [name], is a 82 y.o. female, DOB - 08-23-1932, [id number]  Admit date - 02/08/2016   Admitting Physician [name], MD  Outpatient Primary MD for the patient is [name], MD  LOS - 4  Outpatient Specialists: [facility]  Chief Complaint  Patient presents with  . Fall       Brief Narrative   82 y.o. female with medical history significant for renal transplant with CKD 3, DVT, COPD, recurrent UTIs, and chronic diastolic CHF who presents to the ED with right hip and occipital head pain following a mechanical fall at home. Patient reports that she was in her usual state of health with no recent fevers, chills, lightheadedness, or dizziness when she suffered a mechanical fall at home today, striking the back of her head on the ground. Patient is anticoagulated with Coumadin for an unprovoked DVT. She has gait problems that have been evaluated by neurology and attributed to arthritis and resulting pain. She has been using a walker and was walking several steps without the walker today when she fell backwards. There was no loss of consciousness, no nausea or vomiting, and no confusion, focal numbness or weakness, or loss of coordination. Prior to the fall, she was in her usual state of health.  ED Course: Upon arrival to the ED, patient is found to be saturating well on room air and with vital signs stable. Head CT was obtained and negative for acute intracranial abnormality. EKG demonstrates an arrhythmia, possibly atrial fibrillation. Chest x-ray is negative for acute cardiopulmonary disease but radiographs of the hip demonstrate a moderately angulated right femoral  neck fracture   Subjective:    Patricia [name] Has no new complaints reported today   Assessment  & Plan :    Principal Problem:   Closed right hip fracture [facility]) - Orthopedic surgeon on board and assisting with management - Continue supportive therapy - Physical therapy recommending SNF  UTI - Urine culture growing E coli which is MDR. Resistant to Bactrim - as such will place consult for pharmacy to see  if patient is fosfomycin candidate. Otherwise will have to place on appropriate antibiotic regimen.  Active Problems:   Hx of kidney transplant - continue current regimen and monitor    Hypothyroidism - stable    Depression - continue prozac    Anxiety   COPD (chronic obstructive pulmonary disease) (HCC) - stable currently    Diabetes ([place]) - diabetic diet - continue SSI    DVT, lower extremity, proximal (HCC) - INR too high for surgical intervention for hip fracture repair. INR trending down after vitamin K - Pharmacy to manage coumadin    CKD (chronic kidney disease) stage 3, GFR 30-59 ml/min - serum creatinine trending up. Hold lasix - reassess next am    Chronic diastolic CHF (congestive heart failure) (HCC) - serum creatinine trending up as such will hold lasix  Overactive bladder - will d/c enablex continue oxybutinin  Code Status : Full  Family Communication  : Discussed directly with patient  Disposition Plan  : Pending recommendations by specialist  Barriers For Discharge : awaiting PT recommendations for disposition.  Consults  :  Orthopedic surgeon  Procedures  : Please refer to ortho's notes  DVT Prophylaxis  :  Elevated INR secondary to history of, patient on warfarin as outpatient  Lab Results  Component Value Date   PLT 148* 02/12/2016    Antibiotics  :  Clindamycin Bactrim vancomycin  Anti-infectives    Start     Dose/Rate Route Frequency Ordered Stop   02/11/16 0600  vancomycin (VANCOCIN) IVPB 1000 mg/200 mL premix     1,000  mg 200 mL/hr over 60 Minutes Intravenous Every 12 hours 02/10/16 1854 02/11/16 0642   02/10/16 0700  clindamycin (CLEOCIN) IVPB 900 mg     900 mg 100 mL/hr over 30 Minutes Intravenous To ShortStay Surgical 02/09/16 1106 02/10/16 0806   02/10/16 0630  vancomycin (VANCOCIN) IVPB 1000 mg/200 mL premix  Status:  Discontinued     1,000 mg 200 mL/hr over 60 Minutes Intravenous To ShortStay Surgical 02/09/16 1106 02/10/16 1117   02/08/16 1915  sulfamethoxazole-trimethoprim (BACTRIM,SEPTRA) 400-80 MG per tablet 1 tablet     1 tablet Oral Once per day on Mon Wed Fri 02/08/16 1902          Objective:   Filed Vitals:   02/12/16 0934 02/12/16 0950 02/12/16 1215 02/12/16 1357  BP: 127/46  104/51   Pulse: 72     Temp:   98.4 F (36.9 C)   TempSrc:   Oral   Resp:   14   Height:   5\' 5"  (1.651 m)   Weight:   78.699 kg (173 lb 8 oz)   SpO2:  97% 98% 90%    Wt Readings from Last 3 Encounters:  02/12/16 78.699 kg (173 lb 8 oz)  05/27/15 85.186 kg (187 lb 12.8 oz)  03/02/14 77.066 kg (169 lb 14.4 oz)    No intake or output data in the 24 hours ending 02/12/16 1555   Physical Exam  Awake Alert, No new F.N deficits, Normal affect Supple Neck,No JVD, No cervical lymphadenopathy appriciated.  Symmetrical Chest wall movement, Good air movement bilaterally, CTAB RRR,No Gallops,Rubs or new Murmurs, +ve B.Sounds, Abd Soft, No tenderness, No organomegaly appriciated, No rebound - guarding or rigidity. No  Clubbing, equal tone    Data Review:    CBC  Recent Labs Lab 02/08/16 1650 02/08/16 1702 02/10/16 0539 02/10/16 2011 02/11/16 0349 02/12/16 0334  WBC 7.9  --  10.5 11.1* 9.5 7.1  HGB 12.8 14.3 12.7 10.0* 9.6* 8.4*  HCT 38.8 42.0 39.9 31.6* 31.5* 26.2*  PLT 219  --  203 171 168 148*  MCV 91.7  --  93.0 96.0 95.5 95.3  MCH 30.3  --  29.6 30.4 29.1 30.5  MCHC 33.0  --  31.8 31.6 30.5 32.1  RDW 15.7*  --  15.6* 15.8* 16.0* 15.8*  LYMPHSABS 1.7  --   --   --   --   --   MONOABS  0.4  --   --   --   --   --   EOSABS 0.0  --   --   --   --   --   BASOSABS 0.0  --   --   --   --   --     Chemistries   Recent Labs Lab 02/08/16 1650 02/08/16 1702 02/09/16 0510 02/10/16 0539 02/10/16 2011 02/11/16 0349 02/12/16 0334  NA 142 141 144 139  --  139 136  K 4.2 4.1 4.5 3.7  --  4.3 4.3  CL 109 109 107 105  --  108 106  CO2 21*  --  28 19*  --  19* 22  GLUCOSE 147* 138* 109* 111*  --  122* 128*  BUN 31* 29* 28* 28*  --  32* 36*  CREATININE 1.92* 2.00* 2.15* 2.07* 2.65* 2.76* 2.66*  CALCIUM 10.0  --  9.7 9.8  --  8.9 9.1   ------------------------------------------------------------------------------------------------------------------ No results for input(s): CHOL, HDL, LDLCALC, TRIG, CHOLHDL, LDLDIRECT in the last 72 hours.  Lab Results  Component Value Date   HGBA1C 6.5* 02/08/2016   ------------------------------------------------------------------------------------------------------------------ No results for input(s): TSH, T4TOTAL, T3FREE, THYROIDAB in the last 72 hours.  Invalid input(s): FREET3 ------------------------------------------------------------------------------------------------------------------ No results for input(s): VITAMINB12, FOLATE, FERRITIN, TIBC, IRON, RETICCTPCT in the last 72 hours.  Coagulation profile  Recent Labs Lab 02/08/16 1935 02/09/16 0510 02/10/16 0539 02/11/16 0349 02/12/16 0334  INR 2.52* 2.30* 1.38 1.43 1.69*    No results for input(s): DDIMER in the last 72 hours.  Cardiac Enzymes  Recent Labs Lab 02/08/16 1935 02/09/16 0040 02/09/16 0616  TROPONINI 0.03 0.04* 0.05*   ------------------------------------------------------------------------------------------------------------------ No results found for: BNP  Inpatient Medications  Scheduled Meds: . acidophilus  1 capsule Oral Daily  . azaTHIOprine  50 mg Oral BID  . cholecalciferol  2,000 Units Oral QPM  . cloNIDine  0.1 mg Oral BID  .  colesevelam  1,875 mg Oral BID WC  . cycloSPORINE modified  100 mg Oral BID  . docusate sodium  100 mg Oral BID  . enoxaparin (LOVENOX) injection  40 mg Subcutaneous Q24H  . feeding supplement (GLUCERNA SHAKE)  237 mL Oral BID BM  . fenofibrate  54 mg Oral Daily  . FLUoxetine  20 mg Oral Daily  . insulin aspart  0-9 Units Subcutaneous TID WC  . levothyroxine  75 mcg Oral QAC breakfast  . loratadine  10 mg Oral Daily  . metoprolol succinate  100 mg Oral Daily  . mirabegron ER  25 mg Oral QPM  . mometasone-formoterol  2 puff Inhalation BID  . multivitamin with minerals  1 tablet Oral Daily  . omega-3 acid ethyl esters  1 capsule Oral Daily  . oxybutynin  10 mg Oral QHS  . pantoprazole  40 mg Oral Daily  . predniSONE  5 mg Oral Q breakfast  . senna  1 tablet Oral BID  . sulfamethoxazole-trimethoprim  1 tablet Oral Once per day  on Mon Wed Fri  . warfarin  3 mg Oral ONCE-1800  . Warfarin - Pharmacist Dosing Inpatient   Does not apply q1800   Continuous Infusions: . sodium chloride 75 mL/hr at 02/10/16 1240   PRN Meds:.acetaminophen **OR** acetaminophen, bisacodyl, fluticasone, hydrALAZINE, HYDROcodone-acetaminophen, HYDROmorphone (DILAUDID) injection, hyoscyamine, ipratropium-albuterol, magnesium citrate, menthol-cetylpyridinium **OR** phenol, methocarbamol **OR** methocarbamol (ROBAXIN)  IV, metoCLOPramide **OR** metoCLOPramide (REGLAN) injection, ondansetron **OR** ondansetron (ZOFRAN) IV, polyethylene glycol, sorbitol  Micro Results Recent Results (from the past 240 hour(s))  Urine culture     Status: Abnormal   Collection Time: 02/09/16  2:30 AM  Result Value Ref Range Status   Specimen Description URINE, CATHETERIZED  Final   Special Requests NONE  Final   Culture (A)  Final    >=100,000 COLONIES/mL ESCHERICHIA COLI Confirmed Extended Spectrum Beta-Lactamase Producer (ESBL)    Report Status 02/12/2016 FINAL  Final   Organism ID, Bacteria ESCHERICHIA COLI (A)  Final       Susceptibility   Escherichia coli - MIC*    AMPICILLIN >=32 RESISTANT Resistant     CEFAZOLIN >=64 RESISTANT Resistant     CEFTRIAXONE >=64 RESISTANT Resistant     CIPROFLOXACIN >=4 RESISTANT Resistant     GENTAMICIN <=1 SENSITIVE Sensitive     IMIPENEM <=0.25 SENSITIVE Sensitive     NITROFURANTOIN <=16 SENSITIVE Sensitive     TRIMETH/SULFA >=320 RESISTANT Resistant     AMPICILLIN/SULBACTAM 4 SENSITIVE Sensitive     PIP/TAZO <=4 SENSITIVE Sensitive     * >=100,000 COLONIES/mL ESCHERICHIA COLI  Surgical pcr screen     Status: None   Collection Time: 02/10/16  3:04 AM  Result Value Ref Range Status   MRSA, PCR NEGATIVE NEGATIVE Final   Staphylococcus aureus NEGATIVE NEGATIVE Final    Comment:        The Xpert SA Assay (FDA approved for NASAL specimens in patients over 15 years of age), is one component of a comprehensive surveillance program.  Test performance has been validated by [facility] for patients greater than or equal to 59 year old. It is not intended to diagnose infection nor to guide or monitor treatment.     Radiology Reports Dg Chest 1 View  02/08/2016  CLINICAL DATA:  Per EMS_ pt stood to go to the bathroom with her walker and lost her balance today. Pt noted to have shortening and rotation to right leg. No chest complaints. EXAM: CHEST 1 VIEW COMPARISON:  05/26/2015 FINDINGS: Normal cardiac silhouette. Calcified granulomas noted. No effusion, infiltrate pneumothorax. No acute osseous abnormality. IMPRESSION: No acute cardiopulmonary process. Electronically Signed   By: Suzy Bouchard M.D.   On: 02/08/2016 17:35   Dg Pelvis 1-2 Views  02/08/2016  CLINICAL DATA:  Right leg shortening after fall today. EXAM: PELVIS - 1-2 VIEW COMPARISON:  None. FINDINGS: Moderately angulated fracture involving the right femoral neck is noted. Left hip appears normal. Sacroiliac joints appear normal. IMPRESSION: Moderately angulated right femoral neck fracture. Electronically Signed    By: Marijo Conception, M.D.   On: 02/08/2016 17:39   Ct Head Wo Contrast  02/08/2016  CLINICAL DATA:  Dizziness, head laceration after fall. No loss of consciousness. EXAM: CT HEAD WITHOUT CONTRAST TECHNIQUE: Contiguous axial images were obtained from the base of the skull through the vertex without intravenous contrast. COMPARISON:  CT scan of April 23, 2012. FINDINGS: Status post left occipital craniotomy. Minimal diffuse cortical atrophy is noted. Mild chronic ischemic white matter disease is noted. Stable 2.2 cm hyperdense mass  is seen in left posterior fossa. Stable encephalomalacia of left cerebellar hemisphere is noted. There is no evidence of hemorrhage or acute infarction. Ventricular size is within normal limits. No mass effect or midline shift is noted. IMPRESSION: Stable postoperative changes are seen involving the left occipital skull and left cerebellar hemisphere with associated 2.2 cm hyperdense mass most consistent with meningioma. No significant changes noted compared to prior exam. Electronically Signed   By: Marijo Conception, M.D.   On: 02/08/2016 18:35   Pelvis Portable  02/10/2016  CLINICAL DATA:  Status post right hip replacement EXAM: PORTABLE PELVIS 1-2 VIEWS COMPARISON:  02/08/2016 FINDINGS: There is now a right hip replacement identified in satisfactory position. No acute soft tissue or bony abnormality is noted. IMPRESSION: Status post right hip replacement. Electronically Signed   By: Inez Catalina M.D.   On: 02/10/2016 11:10   Dg Hip Operative Unilat With Pelvis Right  02/10/2016  CLINICAL DATA:  Right total hip arthroplasty for right femoral neck fracture EXAM: OPERATIVE RIGHT HIP (WITH PELVIS IF PERFORMED) 2 VIEWS TECHNIQUE: Fluoroscopic spot image(s) were submitted for interpretation post-operatively. COMPARISON:  02/08/2016 pelvic and right femur radiographs FINDINGS: Fluoroscopy time 34 seconds. Two nondiagnostic spot fluoroscopic intraoperative radiographs demonstrate  postsurgical changes from right total hip arthroplasty. Right acetabular and right proximal femoral prostheses appear well positioned. IMPRESSION: Intraoperative fluoroscopic guidance for right total hip arthroplasty. Electronically Signed   By: Ilona Sorrel M.D.   On: 02/10/2016 10:06   Dg Femur, Min 2 Views Right  02/08/2016  CLINICAL DATA:  Initial encounter for Per EMS_ pt stood to go to the bathroom with her walker and lost her balance today. Pt noted to have shortening and rotation to right leg. No chest complaints. EXAM: RIGHT FEMUR 2 VIEWS COMPARISON:  Pelvic films of earlier today FINDINGS: Osteopenia. varus angulation involving mid femoral neck fracture. No dislocation. distal femur intact, with joint space narrowing involving the knee. No knee joint effusion. Artifact degradation about the distal femur. IMPRESSION: Mid femoral neck fracture, as before. Electronically Signed   By: Abigail Miyamoto M.D.   On: 02/08/2016 17:48    Time Spent in minutes  30   Velvet Bathe M.D on 02/12/2016 at 3:55 PM  Between 7am to 7pm - Pager - [phone]  After 7pm go to [email or web address] - password [facility]  [facility] -  Office  [phone]

## 2016-02-13 ENCOUNTER — Encounter (HOSPITAL_COMMUNITY): Payer: Self-pay | Admitting: Orthopedic Surgery

## 2016-02-13 LAB — CBC
HCT: 22.4 % — ABNORMAL LOW (ref 36.0–46.0)
HEMOGLOBIN: 7.2 g/dL — AB (ref 12.0–15.0)
MCH: 30.3 pg (ref 26.0–34.0)
MCHC: 32.1 g/dL (ref 30.0–36.0)
MCV: 94.1 fL (ref 78.0–100.0)
Platelets: 158 10*3/uL (ref 150–400)
RBC: 2.38 MIL/uL — AB (ref 3.87–5.11)
RDW: 15.9 % — ABNORMAL HIGH (ref 11.5–15.5)
WBC: 6.1 10*3/uL (ref 4.0–10.5)

## 2016-02-13 LAB — PROTIME-INR
INR: 2 — ABNORMAL HIGH (ref 0.00–1.49)
PROTHROMBIN TIME: 22.6 s — AB (ref 11.6–15.2)

## 2016-02-13 LAB — GLUCOSE, CAPILLARY
GLUCOSE-CAPILLARY: 126 mg/dL — AB (ref 65–99)
GLUCOSE-CAPILLARY: 148 mg/dL — AB (ref 65–99)
Glucose-Capillary: 142 mg/dL — ABNORMAL HIGH (ref 65–99)

## 2016-02-13 MED ORDER — SULFAMETHOXAZOLE-TRIMETHOPRIM 400-80 MG PO TABS
1.0000 | ORAL_TABLET | ORAL | Status: DC
  Administered 2016-02-13: 1 via ORAL
  Filled 2016-02-13: qty 1

## 2016-02-13 MED ORDER — FERROUS SULFATE 325 (65 FE) MG PO TABS
325.0000 mg | ORAL_TABLET | Freq: Two times a day (BID) | ORAL | Status: DC
  Administered 2016-02-13 – 2016-02-14 (×2): 325 mg via ORAL
  Filled 2016-02-13 (×2): qty 1

## 2016-02-13 MED ORDER — FOSFOMYCIN TROMETHAMINE 3 G PO PACK
3.0000 g | PACK | Freq: Once | ORAL | Status: AC
  Administered 2016-02-13: 3 g via ORAL
  Filled 2016-02-13: qty 3

## 2016-02-13 MED ORDER — WARFARIN SODIUM 2 MG PO TABS
2.0000 mg | ORAL_TABLET | Freq: Once | ORAL | Status: AC
  Administered 2016-02-13: 2 mg via ORAL
  Filled 2016-02-13: qty 1

## 2016-02-13 NOTE — Progress Notes (Signed)
ANTICOAGULATION CONSULT NOTE - Initial Consult  Pharmacy Consult for coumadin Indication: on coumadin PTA for unprovoked DVT, now s/p R THR  Allergies  Allergen Reactions  . Macrolides And Ketolides Nausea Only  . Aricept [Donepezil Hcl] Nausea And Vomiting  . Codeine Nausea And Vomiting  . Morphine Nausea And Vomiting  . Penicillin G Nausea Only  . Penicillins Nausea And Vomiting and Other (See Comments)    HEADACHE   . Statins Other (See Comments)    Doesn't remember  . Streptomycin Other (See Comments)    headache   . Tetracycline Nausea Only  . Tetracyclines & Related Rash    Patient Measurements: Weight : 85.2 kg on 05/27/15  Height: 5\' 5"  (165.1 cm) Weight: 173 lb 8 oz (78.699 kg) IBW/kg (Calculated) : 57  Vital Signs: Temp: 98.2 F (36.8 C) (05/01 0449) Temp Source: Oral (05/01 0449) BP: 133/44 mmHg (05/01 0449) Pulse Rate: 68 (05/01 0449)  Labs:  Recent Labs  02/10/16 2011 02/11/16 0349 02/12/16 0334 02/13/16 0336  HGB 10.0* 9.6* 8.4* 7.2*  HCT 31.6* 31.5* 26.2* 22.4*  PLT 171 168 148* 158  LABPROT  --  17.6* 19.9* 22.6*  INR  --  1.43 1.69* 2.00*  CREATININE 2.65* 2.76* 2.66*  --     Estimated Creatinine Clearance: 17.2 mL/min (by C-G formula based on Cr of 2.66).  Assessment: 82 yo F on coumadin PTA for unprovoked DVT, now s/p R THR. She is on bactrim long-term MWF for kidney transplant.  INR 1.69 > 2 lovenox d/c'd, hgb trending down to 7.2 today, Pltc wnl  PTA dose per spouse: Take 4 tablets (4 mg) by mouth 1st day, then take 3 tablets (3 mg) for 2-3 days, then repeat. Spouse could not remember exactly how many days for the 3 mg dose. INR was therapeutic 2.52 on admit.  Goal of Therapy:  INR 2-3 Monitor platelets by anticoagulation protocol: Yes   Plan:  Coumadin 2 mg po x 1 dose tonight Daily INR  Maryanna Shape, PharmD, BCPS  Clinical Pharmacist  Pager: [phone]   02/13/2016 10:56 AM

## 2016-02-13 NOTE — Progress Notes (Signed)
PROGRESS NOTE                                                                                                                                                                                                             Patient Demographics:    Patricia [name], is a 82 y.o. female, DOB - [birth year], [id number]  Admit date - 02/08/2016   Admitting Physician [name], MD  Outpatient Primary MD for the patient is Patricia Argyle, MD  LOS - 5  Outpatient Specialists: [facility]  Chief Complaint  Patient presents with  . Fall       Brief Narrative   82 y.o. female with medical history significant for renal transplant with CKD 3, DVT, COPD, recurrent UTIs, and chronic diastolic CHF who presents to the ED with right hip and occipital head pain following a mechanical fall at home. Patient reports that she was in her usual state of health with no recent fevers, chills, lightheadedness, or dizziness when she suffered a mechanical fall at home today, striking the back of her head on the ground. Patient is anticoagulated with Coumadin for an unprovoked DVT. She has gait problems that have been evaluated by neurology and attributed to arthritis and resulting pain. She has been using a walker and was walking several steps without the walker today when she fell backwards. There was no loss of consciousness, no nausea or vomiting, and no confusion, focal numbness or weakness, or loss of coordination. Prior to the fall, she was in her usual state of health.  ED Course: Upon arrival to the ED, patient is found to be saturating well on room air and with vital signs stable. Head CT was obtained and negative for acute intracranial abnormality. EKG demonstrates an arrhythmia, possibly atrial fibrillation. Chest x-ray is negative for acute cardiopulmonary disease but radiographs of the hip demonstrate a moderately angulated right femoral  neck fracture   Subjective:    [name] Has no new complaints reported today   Assessment  & Plan :    Principal Problem:   Closed right hip fracture [facility]) - Orthopedic surgeon on board and assisting with management - Continue supportive therapy - Physical therapy recommending SNF\  Anemia - worsening. Will place on ferrous sulfate given recent blood loss from operation - Will monitor next am. Once hgb stable will  plan on discharging - will reassess cbc next am.  UTI - Urine culture growing E coli which is MDR. Resistant to Bactrim - as such will place consult for pharmacy to see if patient is fosfomycin candidate. Otherwise will have to place on appropriate antibiotic regimen.  Active Problems:   Hx of kidney transplant - continue current regimen and monitor    Hypothyroidism - stable    Depression - continue prozac    Anxiety   COPD (chronic obstructive pulmonary disease) (HCC) - stable currently    Diabetes ([place]) - diabetic diet - continue SSI    DVT, lower extremity, proximal (HCC) - INR at 2.0 will reassess next am. - Pharmacy to manage coumadin    CKD (chronic kidney disease) stage 3, GFR 30-59 ml/min - serum creatinine trending up. Hold lasix - reassess next am    Chronic diastolic CHF (congestive heart failure) (HCC) - serum creatinine trending up as such will hold lasix  Overactive bladder - will d/c enablex continue oxybutinin  Code Status : Full  Family Communication  : Discussed directly with patient  Disposition Plan  : Pending recommendations by specialist  Barriers For Discharge : awaiting PT recommendations for disposition.  Consults  :  Orthopedic surgeon  Procedures  : Please refer to ortho's notes  DVT Prophylaxis  :  Elevated INR secondary to history of, patient on warfarin as outpatient  Lab Results  Component Value Date   PLT 158 02/13/2016    Antibiotics  :  Clindamycin Bactrim vancomycin  Anti-infectives     Start     Dose/Rate Route Frequency Ordered Stop   02/13/16 2000  sulfamethoxazole-trimethoprim (BACTRIM,SEPTRA) 400-80 MG per tablet 1 tablet     1 tablet Oral Every M-W-F (2000) 02/13/16 1251     02/11/16 0600  vancomycin (VANCOCIN) IVPB 1000 mg/200 mL premix     1,000 mg 200 mL/hr over 60 Minutes Intravenous Every 12 hours 02/10/16 1854 02/11/16 0642   02/10/16 0700  clindamycin (CLEOCIN) IVPB 900 mg     900 mg 100 mL/hr over 30 Minutes Intravenous To ShortStay Surgical 02/09/16 1106 02/10/16 0806   02/10/16 0630  vancomycin (VANCOCIN) IVPB 1000 mg/200 mL premix  Status:  Discontinued     1,000 mg 200 mL/hr over 60 Minutes Intravenous To ShortStay Surgical 02/09/16 1106 02/10/16 1117   02/08/16 1915  sulfamethoxazole-trimethoprim (BACTRIM,SEPTRA) 400-80 MG per tablet 1 tablet  Status:  Discontinued     1 tablet Oral Once per day on Mon Wed Fri 02/08/16 1902 02/12/16 1608        Objective:   Filed Vitals:   02/12/16 2021 02/13/16 0449 02/13/16 1129 02/13/16 1412  BP: 129/45 133/44 110/45 114/35  Pulse: 74 68 60 57  Temp: 98.5 F (36.9 C) 98.2 F (36.8 C)  98.7 F (37.1 C)  TempSrc: Oral Oral  Oral  Resp: 16 18  18   Height:      Weight:      SpO2: 97% 94%  98%    Wt Readings from Last 3 Encounters:  02/12/16 78.699 kg (173 lb 8 oz)  05/27/15 85.186 kg (187 lb 12.8 oz)  03/02/14 77.066 kg (169 lb 14.4 oz)     Intake/Output Summary (Last 24 hours) at 02/13/16 1621 Last data filed at 02/13/16 0900  Gross per 24 hour  Intake    120 ml  Output      0 ml  Net    120 ml     Physical Exam  Awake Alert, No new F.N deficits, Normal affect Supple Neck,No JVD, No cervical lymphadenopathy appriciated.  Symmetrical Chest wall movement, Good air movement bilaterally, CTAB RRR,No Gallops,Rubs or new Murmurs, +ve B.Sounds, Abd Soft, No tenderness, No organomegaly appriciated, No rebound - guarding or rigidity. No  Clubbing, equal tone    Data Review:    CBC  Recent  Labs Lab 02/08/16 1650  02/10/16 0539 02/10/16 2011 02/11/16 0349 02/12/16 0334 02/13/16 0336  WBC 7.9  --  10.5 11.1* 9.5 7.1 6.1  HGB 12.8  < > 12.7 10.0* 9.6* 8.4* 7.2*  HCT 38.8  < > 39.9 31.6* 31.5* 26.2* 22.4*  PLT 219  --  203 171 168 148* 158  MCV 91.7  --  93.0 96.0 95.5 95.3 94.1  MCH 30.3  --  29.6 30.4 29.1 30.5 30.3  MCHC 33.0  --  31.8 31.6 30.5 32.1 32.1  RDW 15.7*  --  15.6* 15.8* 16.0* 15.8* 15.9*  LYMPHSABS 1.7  --   --   --   --   --   --   MONOABS 0.4  --   --   --   --   --   --   EOSABS 0.0  --   --   --   --   --   --   BASOSABS 0.0  --   --   --   --   --   --   < > = values in this interval not displayed.  Chemistries   Recent Labs Lab 02/08/16 1650 02/08/16 1702 02/09/16 0510 02/10/16 0539 02/10/16 2011 02/11/16 0349 02/12/16 0334  NA 142 141 144 139  --  139 136  K 4.2 4.1 4.5 3.7  --  4.3 4.3  CL 109 109 107 105  --  108 106  CO2 21*  --  28 19*  --  19* 22  GLUCOSE 147* 138* 109* 111*  --  122* 128*  BUN 31* 29* 28* 28*  --  32* 36*  CREATININE 1.92* 2.00* 2.15* 2.07* 2.65* 2.76* 2.66*  CALCIUM 10.0  --  9.7 9.8  --  8.9 9.1   ------------------------------------------------------------------------------------------------------------------ No results for input(s): CHOL, HDL, LDLCALC, TRIG, CHOLHDL, LDLDIRECT in the last 72 hours.  Lab Results  Component Value Date   HGBA1C 6.5* 02/08/2016   ------------------------------------------------------------------------------------------------------------------ No results for input(s): TSH, T4TOTAL, T3FREE, THYROIDAB in the last 72 hours.  Invalid input(s): FREET3 ------------------------------------------------------------------------------------------------------------------ No results for input(s): VITAMINB12, FOLATE, FERRITIN, TIBC, IRON, RETICCTPCT in the last 72 hours.  Coagulation profile  Recent Labs Lab 02/09/16 0510 02/10/16 0539 02/11/16 0349 02/12/16 0334 02/13/16 0336    INR 2.30* 1.38 1.43 1.69* 2.00*    No results for input(s): DDIMER in the last 72 hours.  Cardiac Enzymes  Recent Labs Lab 02/08/16 1935 02/09/16 0040 02/09/16 0616  TROPONINI 0.03 0.04* 0.05*   ------------------------------------------------------------------------------------------------------------------ No results found for: BNP  Inpatient Medications  Scheduled Meds: . acidophilus  1 capsule Oral Daily  . azaTHIOprine  50 mg Oral BID  . cholecalciferol  2,000 Units Oral QPM  . cloNIDine  0.1 mg Oral BID  . colesevelam  1,875 mg Oral BID WC  . cycloSPORINE modified  100 mg Oral BID  . docusate sodium  100 mg Oral BID  . feeding supplement (GLUCERNA SHAKE)  237 mL Oral BID BM  . fenofibrate  54 mg Oral Daily  . FLUoxetine  20 mg Oral Daily  . insulin aspart  0-9 Units  Subcutaneous TID WC  . levothyroxine  75 mcg Oral QAC breakfast  . loratadine  10 mg Oral Daily  . metoprolol succinate  100 mg Oral Daily  . mirabegron ER  25 mg Oral QPM  . mometasone-formoterol  2 puff Inhalation BID  . multivitamin with minerals  1 tablet Oral Daily  . omega-3 acid ethyl esters  1 capsule Oral Daily  . oxybutynin  10 mg Oral QHS  . pantoprazole  40 mg Oral Daily  . predniSONE  5 mg Oral Q breakfast  . senna  1 tablet Oral BID  . sulfamethoxazole-trimethoprim  1 tablet Oral Q M,W,F-2000  . warfarin  2 mg Oral ONCE-1800  . Warfarin - Pharmacist Dosing Inpatient   Does not apply q1800   Continuous Infusions: . sodium chloride 75 mL/hr at 02/10/16 1240   PRN Meds:.acetaminophen **OR** acetaminophen, bisacodyl, fluticasone, hydrALAZINE, HYDROcodone-acetaminophen, HYDROmorphone (DILAUDID) injection, hyoscyamine, ipratropium-albuterol, magnesium citrate, menthol-cetylpyridinium **OR** phenol, methocarbamol **OR** methocarbamol (ROBAXIN)  IV, metoCLOPramide **OR** metoCLOPramide (REGLAN) injection, ondansetron **OR** ondansetron (ZOFRAN) IV, polyethylene glycol, sorbitol  Micro  Results Recent Results (from the past 240 hour(s))  Urine culture     Status: Abnormal   Collection Time: 02/09/16  2:30 AM  Result Value Ref Range Status   Specimen Description URINE, CATHETERIZED  Final   Special Requests NONE  Final   Culture (A)  Final    >=100,000 COLONIES/mL ESCHERICHIA COLI Confirmed Extended Spectrum Beta-Lactamase Producer (ESBL)    Report Status 02/12/2016 FINAL  Final   Organism ID, Bacteria ESCHERICHIA COLI (A)  Final      Susceptibility   Escherichia coli - MIC*    AMPICILLIN >=32 RESISTANT Resistant     CEFAZOLIN >=64 RESISTANT Resistant     CEFTRIAXONE >=64 RESISTANT Resistant     CIPROFLOXACIN >=4 RESISTANT Resistant     GENTAMICIN <=1 SENSITIVE Sensitive     IMIPENEM <=0.25 SENSITIVE Sensitive     NITROFURANTOIN <=16 SENSITIVE Sensitive     TRIMETH/SULFA >=320 RESISTANT Resistant     AMPICILLIN/SULBACTAM 4 SENSITIVE Sensitive     PIP/TAZO <=4 SENSITIVE Sensitive     * >=100,000 COLONIES/mL ESCHERICHIA COLI  Surgical pcr screen     Status: None   Collection Time: 02/10/16  3:04 AM  Result Value Ref Range Status   MRSA, PCR NEGATIVE NEGATIVE Final   Staphylococcus aureus NEGATIVE NEGATIVE Final    Comment:        The Xpert SA Assay (FDA approved for NASAL specimens in patients over 39 years of age), is one component of a comprehensive surveillance program.  Test performance has been validated by [facility] for patients greater than or equal to 66 year old. It is not intended to diagnose infection nor to guide or monitor treatment.     Radiology Reports Dg Chest 1 View  02/08/2016  CLINICAL DATA:  Per EMS_ pt stood to go to the bathroom with her walker and lost her balance today. Pt noted to have shortening and rotation to right leg. No chest complaints. EXAM: CHEST 1 VIEW COMPARISON:  05/26/2015 FINDINGS: Normal cardiac silhouette. Calcified granulomas noted. No effusion, infiltrate pneumothorax. No acute osseous abnormality.  IMPRESSION: No acute cardiopulmonary process. Electronically Signed   By: Suzy Bouchard M.D.   On: 02/08/2016 17:35   Dg Pelvis 1-2 Views  02/08/2016  CLINICAL DATA:  Right leg shortening after fall today. EXAM: PELVIS - 1-2 VIEW COMPARISON:  None. FINDINGS: Moderately angulated fracture involving the right femoral neck is noted. Left hip  appears normal. Sacroiliac joints appear normal. IMPRESSION: Moderately angulated right femoral neck fracture. Electronically Signed   By: Marijo Conception, M.D.   On: 02/08/2016 17:39   Ct Head Wo Contrast  02/08/2016  CLINICAL DATA:  Dizziness, head laceration after fall. No loss of consciousness. EXAM: CT HEAD WITHOUT CONTRAST TECHNIQUE: Contiguous axial images were obtained from the base of the skull through the vertex without intravenous contrast. COMPARISON:  CT scan of April 23, 2012. FINDINGS: Status post left occipital craniotomy. Minimal diffuse cortical atrophy is noted. Mild chronic ischemic white matter disease is noted. Stable 2.2 cm hyperdense mass is seen in left posterior fossa. Stable encephalomalacia of left cerebellar hemisphere is noted. There is no evidence of hemorrhage or acute infarction. Ventricular size is within normal limits. No mass effect or midline shift is noted. IMPRESSION: Stable postoperative changes are seen involving the left occipital skull and left cerebellar hemisphere with associated 2.2 cm hyperdense mass most consistent with meningioma. No significant changes noted compared to prior exam. Electronically Signed   By: Marijo Conception, M.D.   On: 02/08/2016 18:35   Pelvis Portable  02/10/2016  CLINICAL DATA:  Status post right hip replacement EXAM: PORTABLE PELVIS 1-2 VIEWS COMPARISON:  02/08/2016 FINDINGS: There is now a right hip replacement identified in satisfactory position. No acute soft tissue or bony abnormality is noted. IMPRESSION: Status post right hip replacement. Electronically Signed   By: Inez Catalina M.D.   On:  02/10/2016 11:10   Dg Hip Operative Unilat With Pelvis Right  02/10/2016  CLINICAL DATA:  Right total hip arthroplasty for right femoral neck fracture EXAM: OPERATIVE RIGHT HIP (WITH PELVIS IF PERFORMED) 2 VIEWS TECHNIQUE: Fluoroscopic spot image(s) were submitted for interpretation post-operatively. COMPARISON:  02/08/2016 pelvic and right femur radiographs FINDINGS: Fluoroscopy time 34 seconds. Two nondiagnostic spot fluoroscopic intraoperative radiographs demonstrate postsurgical changes from right total hip arthroplasty. Right acetabular and right proximal femoral prostheses appear well positioned. IMPRESSION: Intraoperative fluoroscopic guidance for right total hip arthroplasty. Electronically Signed   By: Ilona Sorrel M.D.   On: 02/10/2016 10:06   Dg Femur, Min 2 Views Right  02/08/2016  CLINICAL DATA:  Initial encounter for Per EMS_ pt stood to go to the bathroom with her walker and lost her balance today. Pt noted to have shortening and rotation to right leg. No chest complaints. EXAM: RIGHT FEMUR 2 VIEWS COMPARISON:  Pelvic films of earlier today FINDINGS: Osteopenia. varus angulation involving mid femoral neck fracture. No dislocation. distal femur intact, with joint space narrowing involving the knee. No knee joint effusion. Artifact degradation about the distal femur. IMPRESSION: Mid femoral neck fracture, as before. Electronically Signed   By: Abigail Miyamoto M.D.   On: 02/08/2016 17:48    Time Spent in minutes  30   Velvet Bathe M.D on 02/13/2016 at 4:21 PM  Between 7am to 7pm - Pager - [phone]  After 7pm go to [email or web address] - password [facility]  [facility] -  Office  [phone]

## 2016-02-13 NOTE — Progress Notes (Signed)
Pharmacy Antibiotic Note  Patricia [name] is a 82 y.o. female admitted on 02/08/2016 with ESBL UTI.  Pharmacy has been consulted for fosfomycin dosing.  Plan: Fosfomycin 3g po x 1 dose  Height: 5\' 5"  (165.1 cm) Weight: 173 lb 8 oz (78.699 kg) IBW/kg (Calculated) : 57  Temp (24hrs), Avg:98.4 F (36.9 C), Min:98.2 F (36.8 C), Max:98.5 F (36.9 C)   Recent Labs Lab 02/09/16 0510 02/10/16 0539 02/10/16 2011 02/11/16 0349 02/12/16 0334 02/13/16 0336  WBC  --  10.5 11.1* 9.5 7.1 6.1  CREATININE 2.15* 2.07* 2.65* 2.76* 2.66*  --     Estimated Creatinine Clearance: 17.2 mL/min (by C-G formula based on Cr of 2.66).    Microbiology results:  BCx:  4/27 UCx: >100k Ecoli ESBL   Sputum:   4/28 MRSA PCR: neg  Heide Guile, PharmD, BCPS-AQ ID Clinical Pharmacist Pager [phone]   02/13/2016 10:29 AM

## 2016-02-13 NOTE — Progress Notes (Signed)
   Subjective:  Patient reports pain as mild.  Progressing slowly with PT.  Objective:   VITALS:   Filed Vitals:   02/12/16 1215 02/12/16 1357 02/12/16 2021 02/13/16 0449  BP: 104/51  129/45 133/44  Pulse:   74 68  Temp: 98.4 F (36.9 C)  98.5 F (36.9 C) 98.2 F (36.8 C)  TempSrc: Oral  Oral Oral  Resp: 14  16 18   Height: 5\' 5"  (1.651 m)     Weight: 78.699 kg (173 lb 8 oz)     SpO2: 98% 90% 97% 94%    ABD soft Sensation intact distally Intact pulses distally Dorsiflexion/Plantar flexion intact Incision: dressing C/D/I   Lab Results  Component Value Date   WBC 6.1 02/13/2016   HGB 7.2* 02/13/2016   HCT 22.4* 02/13/2016   MCV 94.1 02/13/2016   PLT 158 02/13/2016   BMET    Component Value Date/Time   NA 136 02/12/2016 0334   K 4.3 02/12/2016 0334   CL 106 02/12/2016 0334   CO2 22 02/12/2016 0334   GLUCOSE 128* 02/12/2016 0334   BUN 36* 02/12/2016 0334   CREATININE 2.66* 02/12/2016 0334   CALCIUM 9.1 02/12/2016 0334   GFRNONAA 16* 02/12/2016 0334   GFRAA 18* 02/12/2016 0334     Assessment/Plan: 3 Days Post-Op   Principal Problem:   Closed right hip fracture (HCC) Active Problems:   Hx of kidney transplant   Hypothyroidism   Depression   Anxiety   COPD (chronic obstructive pulmonary disease) (HCC)   Diabetes (HCC)   DVT, lower extremity, proximal (HCC)   CKD (chronic kidney disease) stage 3, GFR 30-59 ml/min   Chronic diastolic CHF (congestive heart failure) (HCC)   Hip fracture (HCC)   Displaced fracture of right femoral neck (HCC)   WBAT with walker PT/OT DVT ppx: INR at goal, cont coumadin, d/c lovenox ABLA: per hospitalist ESBL UTI per hospitalist SNF placement   Malcome Ambrocio, Horald Pollen 02/13/2016, 7:09 AM   Rod Can, MD Cell [phone]

## 2016-02-14 LAB — CBC
HEMATOCRIT: 24.4 % — AB (ref 36.0–46.0)
HEMOGLOBIN: 8 g/dL — AB (ref 12.0–15.0)
MCH: 31.1 pg (ref 26.0–34.0)
MCHC: 32.8 g/dL (ref 30.0–36.0)
MCV: 94.9 fL (ref 78.0–100.0)
Platelets: 214 10*3/uL (ref 150–400)
RBC: 2.57 MIL/uL — AB (ref 3.87–5.11)
RDW: 15.7 % — ABNORMAL HIGH (ref 11.5–15.5)
WBC: 5.7 10*3/uL (ref 4.0–10.5)

## 2016-02-14 LAB — PROTIME-INR
INR: 2.73 — AB (ref 0.00–1.49)
PROTHROMBIN TIME: 28.5 s — AB (ref 11.6–15.2)

## 2016-02-14 LAB — GLUCOSE, CAPILLARY
GLUCOSE-CAPILLARY: 176 mg/dL — AB (ref 65–99)
GLUCOSE-CAPILLARY: 111 mg/dL — AB (ref 65–99)
GLUCOSE-CAPILLARY: 135 mg/dL — AB (ref 65–99)

## 2016-02-14 MED ORDER — ACETAMINOPHEN 325 MG PO TABS: 650.0000 mg | ORAL_TABLET | Freq: Four times a day (QID) | ORAL | Status: DC | PRN

## 2016-02-14 MED ORDER — WARFARIN SODIUM 1 MG PO TABS
1.0000 mg | ORAL_TABLET | Freq: Once | ORAL | Status: DC
  Filled 2016-02-14: qty 1

## 2016-02-14 NOTE — Progress Notes (Signed)
Physical Therapy Treatment Patient Details Name: Patricia [name] MRN: [id number] DOB: 1932/01/23 Today's Date: 02/14/2016    History of Present Illness Pt admitted with R hip fx as a result of a fall. s/p anterior hip replacement. PMH: CKD, DVT, COPD, UTIs, CHF, arthritis, tremor.    PT Comments    Pt with flat affect upon arrival. Pt continues to require max assist with mobility and total assist with moving RLE. Pt requires constant verbal and tactile cues to perform HEP. Pt left in chair with lift pad at end of session. Will continue to follow.   Follow Up Recommendations  SNF;Supervision/Assistance - 24 hour     Equipment Recommendations       Recommendations for Other Services       Precautions / Restrictions Precautions Precautions: Fall Restrictions RLE Weight Bearing: Weight bearing as tolerated    Mobility  Bed Mobility Overal bed mobility: Needs Assistance Bed Mobility: Rolling;Sidelying to Sit Rolling: Max assist   Supine to sit: Max assist;+2 for physical assistance     General bed mobility comments: max assist to roll bil for pericare, and bedpan. Max +2 to pivot to EOB with use of pad and helicopter technique, cues for use of rail to assist with elevating trunk as well as with rolling. Pt required mod assist to maintain sidelying  Transfers Overall transfer level: Needs assistance   Transfers: Sit to/from Stand;Stand Pivot Transfers Sit to Stand: +2 physical assistance;Max assist Stand pivot transfers: Total assist;+2 physical assistance       General transfer comment: max multimodal cues with pt able to stand with use of belt and pad to cradle sacrum and assist with rise from elevated surface with bil knees blocked as well as total assist to pivot pelvis to chair and control descent. Total +2 to scoot back in chair with pad  Ambulation/Gait                 Stairs            Wheelchair Mobility    Modified Rankin (Stroke Patients  Only)       Balance Overall balance assessment: Needs assistance Sitting-balance support: Bilateral upper extremity supported Sitting balance-Leahy Scale: Fair Sitting balance - Comments: pt requires supervision with cues for posture to maintain balance after initial mod assist to gain sitting balance     Standing balance-Leahy Scale: Zero                      Cognition Arousal/Alertness: Awake/alert Behavior During Therapy: Flat affect Overall Cognitive Status: History of cognitive impairments - at baseline                      Exercises General Exercises - Lower Extremity Short Arc Quad: 10 reps;AROM;Left;Supine;AAROM;Right;Seated Heel Slides: AROM;Left;10 reps;Supine;PROM;Right;Seated Hip ABduction/ADduction: AROM;Left;Supine;PROM;Right;10 reps;Seated    General Comments        Pertinent Vitals/Pain Pain Assessment: No/denies pain (pt reports no pain at rest but grimace with movement)    Home Living                      Prior Function            PT Goals (current goals can now be found in the care plan section) Progress towards PT goals: Not progressing toward goals - comment;Goals downgraded-see care plan (remains limited by weakness and cognition)    Frequency       PT Plan Current  plan remains appropriate    Co-evaluation             End of Session Equipment Utilized During Treatment: Gait belt Activity Tolerance: Patient tolerated treatment well Patient left: in chair;with call bell/phone within reach;with chair alarm set;Other (comment) (with lift pad)     Time: [id number] PT Time Calculation (min) (ACUTE ONLY): 34 min  Charges:  $Therapeutic Exercise: 8-22 mins $Therapeutic Activity: 8-22 mins                    G Codes:      Antony Sian 20-Feb-2016, 11:04 AM   Haze Justin, SPT [phone]

## 2016-02-14 NOTE — Clinical Social Work Note (Signed)
Patient provided with facility responses on 5/1 and chose [facility]. Admissions director has been contacted and they can accept patient today if medically stable.  [place] Seaborn Givens, MSW, LCSW Licensed Clinical Social Worker [place] [phone]

## 2016-02-14 NOTE — Progress Notes (Signed)
Pt discharge education and instructions completed. Pt discharge to [facility] and reported called off to nurse [place] at the facility. Pt IV removed; surgical incision dsg remains clean, dry and intact. Pt awaiting on PTAR to transport to disposition. Reported off to oncoming RN to monitor till pt pick up. P. Angelica Pou RN

## 2016-02-14 NOTE — Clinical Social Work Placement (Signed)
   CLINICAL SOCIAL WORK PLACEMENT  NOTE 02/14/16 - DISCHARGED TO [facility]  Date:  02/14/2016  Patient Details  Name: Patricia [name] MRN: [id number] Date of Birth: 1931-11-07  Clinical Social Work is seeking post-discharge placement for this patient at the [place] level of care (*CSW will initial, date and re-position this form in  chart as items are completed):  Yes   Patient/family provided with [facility] Work Department's list of facilities offering this level of care within the geographic area requested by the patient (or if unable, by the patient's family).  Yes   Patient/family informed of their freedom to choose among providers that offer the needed level of care, that participate in Medicare, Medicaid or managed care program needed by the patient, have an available bed and are willing to accept the patient.  Yes   Patient/family informed of [facility]'s ownership interest in [facility] and [facility], as well as of the fact that they are under no obligation to receive care at these facilities.  PASRR submitted to EDS on       PASRR number received on       Existing PASRR number confirmed on 02/12/16     FL2 transmitted to all facilities in geographic area requested by pt/family on 02/12/16     FL2 transmitted to all facilities within larger geographic area on       Patient informed that his/her managed care company has contracts with or will negotiate with certain facilities, including the following:        Yes   Patient/family informed of bed offers received.  Patient chooses bed at  [facility]     Physician recommends and patient chooses bed at      Patient to be transferred to  [facility] on  02/14/16.  Patient to be transferred to facility by  ambulance     Patient family notified on  02/14/16 of transfer.  Name of family member notified:   Husband Sharneice Pleger, at the bedside.     PHYSICIAN       Additional Comment:     _______________________________________________ Sable Feil, LCSW 02/14/2016, 4:29 PM

## 2016-02-14 NOTE — Progress Notes (Signed)
ANTICOAGULATION CONSULT NOTE - Initial Consult  Pharmacy Consult for coumadin Indication: on coumadin PTA for unprovoked DVT, now s/p R THR  Allergies  Allergen Reactions  . Macrolides And Ketolides Nausea Only  . Aricept [Donepezil Hcl] Nausea And Vomiting  . Codeine Nausea And Vomiting  . Morphine Nausea And Vomiting  . Penicillin G Nausea Only  . Penicillins Nausea And Vomiting and Other (See Comments)    HEADACHE   . Statins Other (See Comments)    Doesn't remember  . Streptomycin Other (See Comments)    headache   . Tetracycline Nausea Only  . Tetracyclines & Related Rash    Patient Measurements: Weight : 85.2 kg on 05/27/15  Height: 5\' 5"  (165.1 cm) Weight: 173 lb 8 oz (78.699 kg) IBW/kg (Calculated) : 57  Vital Signs: Temp: 97.9 F (36.6 C) (05/02 0930) Temp Source: Oral (05/02 0930) BP: 138/52 mmHg (05/02 0930) Pulse Rate: 65 (05/02 0930)  Labs:  Recent Labs  02/12/16 0334 02/13/16 0336 02/14/16 0531  HGB 8.4* 7.2* 8.0*  HCT 26.2* 22.4* 24.4*  PLT 148* 158 214  LABPROT 19.9* 22.6* 28.5*  INR 1.69* 2.00* 2.73*  CREATININE 2.66*  --   --     Estimated Creatinine Clearance: 17.2 mL/min (by C-G formula based on Cr of 2.66).  Assessment: 82 yo F on coumadin PTA for unprovoked DVT, now s/p R THR. She is on bactrim long-term MWF for kidney transplant.  INR 2 > 2.73, hgb 8, pltc 214K.   PTA dose per spouse: Take 4 tablets (4 mg) by mouth 1st day, then take 3 tablets (3 mg) for 2-3 days, then repeat. Spouse could not remember exactly how many days for the 3 mg dose. INR was therapeutic 2.52 on admit.  Goal of Therapy:  INR 2-3 Monitor platelets by anticoagulation protocol: Yes   Plan:  Coumadin 1 mg po x 1 dose tonight Daily INR  Maryanna Shape, PharmD, BCPS  Clinical Pharmacist  Pager: [phone]   02/14/2016 10:47 AM

## 2016-02-14 NOTE — Discharge Summary (Signed)
Physician Discharge Summary  Patricia [name] [id number] DOB: 31-Mar-1932 DOA: 02/08/2016  PCP: [name], MD  Admit date: 02/08/2016 Discharge date: 02/14/2016  Time spent: > 35 minutes  Recommendations for Outpatient Follow-up:  1.    Discharge Diagnoses:  Principal Problem:   Closed right hip fracture (HCC) Active Problems:   Hx of kidney transplant   Hypothyroidism   Depression   Anxiety   COPD (chronic obstructive pulmonary disease) (HCC)   Diabetes (HCC)   DVT, lower extremity, proximal (HCC)   CKD (chronic kidney disease) stage 3, GFR 30-59 ml/min   Chronic diastolic CHF (congestive heart failure) (HCC)   Hip fracture (HCC)   Displaced fracture of right femoral neck (HCC)   Discharge Condition: stable  Diet recommendation: renal diet  Filed Weights   02/12/16 1215  Weight: 78.699 kg (173 lb 8 oz)    History of present illness:  82 y.o. female with medical history significant for renal transplant with CKD 3, DVT, COPD, recurrent UTIs, and chronic diastolic CHF Upon arrival to the ED, patient is found to be saturating well on room air and with vital signs stable. Head CT was obtained and negative for acute intracranial abnormality. EKG demonstrates an arrhythmia, possibly atrial fibrillation. Chest x-ray is negative for acute cardiopulmonary disease but radiographs of the hip demonstrate a moderately angulated right femoral neck fracture  Hospital Course:  Principal Problem:  Closed right hip fracture [facility]) - Orthopedic surgeon on board and managed while in house - Continue supportive therapy - Physical therapy recommending SNF  Anemia - worsening. Will place on ferrous sulfate given recent blood loss from operation - Will monitor next am. Once hgb stable will plan on discharging - will reassess cbc next am.  UTI - Urine culture growing E coli which is MDR. Resistant to Bactrim, But patient may have this colonized as she did not have any fevers or  dysuria complaints. Was treated with fosfomycin 1 - We'll continue home Bactrim regimen  Active Problems:  Hx of kidney transplant - continue prior to admission home medication regimen   Hypothyroidism - stable   Depression - continue prozac   Anxiety  COPD (chronic obstructive pulmonary disease) (HCC) - stable currently   Diabetes ([place]) - Patient to continue prior to admission home medication regimen   DVT, lower extremity, proximal ([place]) - INR at 2.0 will reassess next am. - Pharmacy to manage coumadin   CKD (chronic kidney disease) stage 3, GFR 30-59 ml/min - serum creatinine stable   Chronic diastolic CHF (congestive heart failure) ([place]) Patient to continue prior to admission home medication regimen  Overactive bladder Patient to continue prior to admission home medication regimen   Procedures:  Please refer to EMR and orthopedic surgeon notes  Consultations:  Orthopedic surgery: Rod Can  Discharge Exam: Filed Vitals:   02/14/16 0500 02/14/16 0930  BP: 141/48 138/52  Pulse: 63 65  Temp: 97.7 F (36.5 C) 97.9 F (36.6 C)  Resp: 18 20    General: Pt in nad, alert and awake Cardiovascular: rrr, no rubs Respiratory: no increased wob, no wheezes  Discharge Instructions    Current Discharge Medication List    START taking these medications   Details  acetaminophen (TYLENOL) 325 MG tablet Take 2 tablets (650 mg total) by mouth every 6 (six) hours as needed for mild pain or moderate pain (or Fever >/= 101).      CONTINUE these medications which have NOT CHANGED   Details  albuterol (PROVENTIL HFA;VENTOLIN  HFA) 108 (90 BASE) MCG/ACT inhaler Inhale 2 puffs into the lungs every 6 (six) hours as needed for wheezing or shortness of breath.     azaTHIOprine (IMURAN) 50 MG tablet Take 50 mg by mouth 2 (two) times daily.     b complex vitamins tablet Take 1 tablet by mouth daily.    BIOTIN PO Take 1 tablet by mouth every evening.     budesonide-formoterol (SYMBICORT) 160-4.5 MCG/ACT inhaler Inhale 1 puff into the lungs daily as needed (shortness of breath or wheezing).     cetirizine (ZYRTEC) 10 MG tablet Take 10 mg by mouth daily. Reported on 02/08/2016    Cholecalciferol (VITAMIN D) 2000 UNITS tablet Take 2,000 Units by mouth every evening.     cloNIDine (CATAPRES) 0.1 MG tablet Take 0.1 mg by mouth 2 (two) times daily.    Coenzyme Q10 (COQ10) 200 MG CAPS Take 200 mg by mouth daily.    colesevelam (WELCHOL) 625 MG tablet Take 1,875 mg by mouth 2 (two) times daily with a meal.    cycloSPORINE modified (NEORAL) 100 MG capsule Take 100 mg by mouth 2 (two) times daily.    fenofibrate (TRICOR) 48 MG tablet Take 48 mg by mouth every evening.     FLUoxetine (PROZAC) 20 MG tablet Take 20 mg by mouth daily.    fluticasone (FLONASE) 50 MCG/ACT nasal spray Place 1 spray into both nostrils daily as needed for allergies or rhinitis.    furosemide (LASIX) 40 MG tablet Take 2 tablets (80 mg total) by mouth 2 (two) times daily. 80mg  in the morning and 40mg  in the evening Qty: 30 tablet    Glucosamine-Chondroitin (MOVE FREE PO) Take 1 tablet by mouth every evening.    L-Lysine 500 MG TABS Take 500 mg by mouth daily.    levothyroxine (SYNTHROID, LEVOTHROID) 75 MCG tablet Take 75 mcg by mouth daily.    Lutein-Zeaxanthin 25-5 MG CAPS Take 1 capsule by mouth daily.    metoprolol succinate (TOPROL-XL) 100 MG 24 hr tablet Take 100 mg by mouth every evening. Reported on 02/10/2016    mirabegron ER (MYRBETRIQ) 25 MG TB24 tablet Take 25 mg by mouth every evening.     Multiple Vitamin (MULTIVITAMIN WITH MINERALS) TABS tablet Take 1 tablet by mouth every evening.    Omega-3 Fatty Acids (FISH OIL) 1000 MG CAPS Take 1,000 mg by mouth every evening.    omeprazole (PRILOSEC) 20 MG capsule Take 20 mg by mouth every evening.     oxybutynin (DITROPAN-XL) 10 MG 24 hr tablet Take 10 mg by mouth daily.     POTASSIUM GLUCONATE PO Take 600  mcg by mouth every evening.    predniSONE (DELTASONE) 5 MG tablet Take 5 mg by mouth daily with breakfast.    Probiotic Product (ALIGN) 4 MG CAPS Take 4 mg by mouth every evening.     Resveratrol 100 MG CAPS Take 100 mg by mouth every evening.     sulfamethoxazole-trimethoprim (BACTRIM,SEPTRA) 400-80 MG per tablet Take 1 tablet by mouth 3 (three) times a week. Monday, Wednesday, and Friday mornings    !! warfarin (COUMADIN) 1 MG tablet Take 3-4 mg by mouth every evening. Take 4 tablets (4 mg) by mouth 1st day, then take 3 tablets (3 mg) for 2-3 days, then repeat    Zinc 50 MG TABS Take 50 mg by mouth every evening.    !! warfarin (COUMADIN) 5 MG tablet Alternate 2.5 mg and 5 mg daily. Qty: 30 tablet, Refills: 0     !! -  Potential duplicate medications found. Please discuss with provider.    STOP taking these medications     hyoscyamine (ANASPAZ) 0.125 MG TBDP        Allergies  Allergen Reactions  . Macrolides And Ketolides Nausea Only  . Aricept [Donepezil Hcl] Nausea And Vomiting  . Codeine Nausea And Vomiting  . Morphine Nausea And Vomiting  . Penicillin G Nausea Only  . Penicillins Nausea And Vomiting and Other (See Comments)    HEADACHE   . Statins Other (See Comments)    Doesn't remember  . Streptomycin Other (See Comments)    headache   . Tetracycline Nausea Only  . Tetracyclines & Related Rash   Follow-up Information    Follow up with Swinteck, Horald Pollen, MD. Schedule an appointment as soon as possible for a visit in 2 weeks.   Specialty:  Orthopedic Surgery   Why:  For wound re-check   Contact information:   [address] [address] [phone]        The results of significant diagnostics from this hospitalization (including imaging, microbiology, ancillary and laboratory) are listed below for reference.    Significant Diagnostic Studies: Dg Chest 1 View  02/08/2016  CLINICAL DATA:  Per EMS_ pt stood to go to the  bathroom with her walker and lost her balance today. Pt noted to have shortening and rotation to right leg. No chest complaints. EXAM: CHEST 1 VIEW COMPARISON:  05/26/2015 FINDINGS: Normal cardiac silhouette. Calcified granulomas noted. No effusion, infiltrate pneumothorax. No acute osseous abnormality. IMPRESSION: No acute cardiopulmonary process. Electronically Signed   By: Suzy Bouchard M.D.   On: 02/08/2016 17:35   Dg Pelvis 1-2 Views  02/08/2016  CLINICAL DATA:  Right leg shortening after fall today. EXAM: PELVIS - 1-2 VIEW COMPARISON:  None. FINDINGS: Moderately angulated fracture involving the right femoral neck is noted. Left hip appears normal. Sacroiliac joints appear normal. IMPRESSION: Moderately angulated right femoral neck fracture. Electronically Signed   By: Marijo Conception, M.D.   On: 02/08/2016 17:39   Ct Head Wo Contrast  02/08/2016  CLINICAL DATA:  Dizziness, head laceration after fall. No loss of consciousness. EXAM: CT HEAD WITHOUT CONTRAST TECHNIQUE: Contiguous axial images were obtained from the base of the skull through the vertex without intravenous contrast. COMPARISON:  CT scan of April 23, 2012. FINDINGS: Status post left occipital craniotomy. Minimal diffuse cortical atrophy is noted. Mild chronic ischemic white matter disease is noted. Stable 2.2 cm hyperdense mass is seen in left posterior fossa. Stable encephalomalacia of left cerebellar hemisphere is noted. There is no evidence of hemorrhage or acute infarction. Ventricular size is within normal limits. No mass effect or midline shift is noted. IMPRESSION: Stable postoperative changes are seen involving the left occipital skull and left cerebellar hemisphere with associated 2.2 cm hyperdense mass most consistent with meningioma. No significant changes noted compared to prior exam. Electronically Signed   By: Marijo Conception, M.D.   On: 02/08/2016 18:35   Pelvis Portable  02/10/2016  CLINICAL DATA:  Status post right hip  replacement EXAM: PORTABLE PELVIS 1-2 VIEWS COMPARISON:  02/08/2016 FINDINGS: There is now a right hip replacement identified in satisfactory position. No acute soft tissue or bony abnormality is noted. IMPRESSION: Status post right hip replacement. Electronically Signed   By: Inez Catalina M.D.   On: 02/10/2016 11:10   Dg Hip Operative Unilat With Pelvis Right  02/10/2016  CLINICAL DATA:  Right total hip arthroplasty for right  femoral neck fracture EXAM: OPERATIVE RIGHT HIP (WITH PELVIS IF PERFORMED) 2 VIEWS TECHNIQUE: Fluoroscopic spot image(s) were submitted for interpretation post-operatively. COMPARISON:  02/08/2016 pelvic and right femur radiographs FINDINGS: Fluoroscopy time 34 seconds. Two nondiagnostic spot fluoroscopic intraoperative radiographs demonstrate postsurgical changes from right total hip arthroplasty. Right acetabular and right proximal femoral prostheses appear well positioned. IMPRESSION: Intraoperative fluoroscopic guidance for right total hip arthroplasty. Electronically Signed   By: Ilona Sorrel M.D.   On: 02/10/2016 10:06   Dg Femur, Min 2 Views Right  02/08/2016  CLINICAL DATA:  Initial encounter for Per EMS_ pt stood to go to the bathroom with her walker and lost her balance today. Pt noted to have shortening and rotation to right leg. No chest complaints. EXAM: RIGHT FEMUR 2 VIEWS COMPARISON:  Pelvic films of earlier today FINDINGS: Osteopenia. varus angulation involving mid femoral neck fracture. No dislocation. distal femur intact, with joint space narrowing involving the knee. No knee joint effusion. Artifact degradation about the distal femur. IMPRESSION: Mid femoral neck fracture, as before. Electronically Signed   By: Abigail Miyamoto M.D.   On: 02/08/2016 17:48    Microbiology: Recent Results (from the past 240 hour(s))  Urine culture     Status: Abnormal   Collection Time: 02/09/16  2:30 AM  Result Value Ref Range Status   Specimen Description URINE, CATHETERIZED  Final    Special Requests NONE  Final   Culture (A)  Final    >=100,000 COLONIES/mL ESCHERICHIA COLI Confirmed Extended Spectrum Beta-Lactamase Producer (ESBL)    Report Status 02/12/2016 FINAL  Final   Organism ID, Bacteria ESCHERICHIA COLI (A)  Final      Susceptibility   Escherichia coli - MIC*    AMPICILLIN >=32 RESISTANT Resistant     CEFAZOLIN >=64 RESISTANT Resistant     CEFTRIAXONE >=64 RESISTANT Resistant     CIPROFLOXACIN >=4 RESISTANT Resistant     GENTAMICIN <=1 SENSITIVE Sensitive     IMIPENEM <=0.25 SENSITIVE Sensitive     NITROFURANTOIN <=16 SENSITIVE Sensitive     TRIMETH/SULFA >=320 RESISTANT Resistant     AMPICILLIN/SULBACTAM 4 SENSITIVE Sensitive     PIP/TAZO <=4 SENSITIVE Sensitive     * >=100,000 COLONIES/mL ESCHERICHIA COLI  Surgical pcr screen     Status: None   Collection Time: 02/10/16  3:04 AM  Result Value Ref Range Status   MRSA, PCR NEGATIVE NEGATIVE Final   Staphylococcus aureus NEGATIVE NEGATIVE Final    Comment:        The Xpert SA Assay (FDA approved for NASAL specimens in patients over 1 years of age), is one component of a comprehensive surveillance program.  Test performance has been validated by [facility] for patients greater than or equal to 57 year old. It is not intended to diagnose infection nor to guide or monitor treatment.      Labs: Basic Metabolic Panel:  Recent Labs Lab 02/08/16 1650 02/08/16 1702 02/09/16 0510 02/10/16 0539 02/10/16 2011 02/11/16 0349 02/12/16 0334  NA 142 141 144 139  --  139 136  K 4.2 4.1 4.5 3.7  --  4.3 4.3  CL 109 109 107 105  --  108 106  CO2 21*  --  28 19*  --  19* 22  GLUCOSE 147* 138* 109* 111*  --  122* 128*  BUN 31* 29* 28* 28*  --  32* 36*  CREATININE 1.92* 2.00* 2.15* 2.07* 2.65* 2.76* 2.66*  CALCIUM 10.0  --  9.7 9.8  --  8.9 9.1   Liver Function Tests: No results for input(s): AST, ALT, ALKPHOS, BILITOT, PROT, ALBUMIN in the last 168 hours. No results for input(s): LIPASE,  AMYLASE in the last 168 hours. No results for input(s): AMMONIA in the last 168 hours. CBC:  Recent Labs Lab 02/08/16 1650  02/10/16 2011 02/11/16 0349 02/12/16 0334 02/13/16 0336 02/14/16 0531  WBC 7.9  < > 11.1* 9.5 7.1 6.1 5.7  NEUTROABS 5.8  --   --   --   --   --   --   HGB 12.8  < > 10.0* 9.6* 8.4* 7.2* 8.0*  HCT 38.8  < > 31.6* 31.5* 26.2* 22.4* 24.4*  MCV 91.7  < > 96.0 95.5 95.3 94.1 94.9  PLT 219  < > 171 168 148* 158 214  < > = values in this interval not displayed. Cardiac Enzymes:  Recent Labs Lab 02/08/16 1935 02/09/16 0040 02/09/16 0616  TROPONINI 0.03 0.04* 0.05*   BNP: BNP (last 3 results) No results for input(s): BNP in the last 8760 hours.  ProBNP (last 3 results) No results for input(s): PROBNP in the last 8760 hours.  CBG:  Recent Labs Lab 02/13/16 1119 02/13/16 1619 02/13/16 2146 02/14/16 0657 02/14/16 1227  GLUCAP 148* 142* 176* 111* 135*     Signed:  Velvet Bathe MD.  [facility] 02/14/2016, 2:41 PM

## 2016-02-15 ENCOUNTER — Non-Acute Institutional Stay (SKILLED_NURSING_FACILITY): Payer: Medicare Other | Admitting: Internal Medicine

## 2016-02-15 ENCOUNTER — Encounter: Payer: Self-pay | Admitting: Internal Medicine

## 2016-02-15 DIAGNOSIS — E785 Hyperlipidemia, unspecified: Secondary | ICD-10-CM

## 2016-02-15 DIAGNOSIS — E119 Type 2 diabetes mellitus without complications: Secondary | ICD-10-CM | POA: Diagnosis not present

## 2016-02-15 DIAGNOSIS — D62 Acute posthemorrhagic anemia: Secondary | ICD-10-CM

## 2016-02-15 DIAGNOSIS — R32 Unspecified urinary incontinence: Secondary | ICD-10-CM

## 2016-02-15 DIAGNOSIS — N183 Chronic kidney disease, stage 3 unspecified: Secondary | ICD-10-CM

## 2016-02-15 DIAGNOSIS — F329 Major depressive disorder, single episode, unspecified: Secondary | ICD-10-CM

## 2016-02-15 DIAGNOSIS — I5031 Acute diastolic (congestive) heart failure: Secondary | ICD-10-CM | POA: Diagnosis not present

## 2016-02-15 DIAGNOSIS — E038 Other specified hypothyroidism: Secondary | ICD-10-CM | POA: Diagnosis not present

## 2016-02-15 DIAGNOSIS — S72001D Fracture of unspecified part of neck of right femur, subsequent encounter for closed fracture with routine healing: Secondary | ICD-10-CM | POA: Diagnosis not present

## 2016-02-15 DIAGNOSIS — J42 Unspecified chronic bronchitis: Secondary | ICD-10-CM

## 2016-02-15 DIAGNOSIS — Z940 Kidney transplant status: Secondary | ICD-10-CM | POA: Diagnosis not present

## 2016-02-15 DIAGNOSIS — E034 Atrophy of thyroid (acquired): Secondary | ICD-10-CM

## 2016-02-15 DIAGNOSIS — I825Y2 Chronic embolism and thrombosis of unspecified deep veins of left proximal lower extremity: Secondary | ICD-10-CM | POA: Diagnosis not present

## 2016-02-15 DIAGNOSIS — E1169 Type 2 diabetes mellitus with other specified complication: Secondary | ICD-10-CM | POA: Insufficient documentation

## 2016-02-15 DIAGNOSIS — F32A Depression, unspecified: Secondary | ICD-10-CM

## 2016-02-15 NOTE — Assessment & Plan Note (Signed)
SNF p stable;cont welchol, omega 3 and tricor

## 2016-02-15 NOTE — Assessment & Plan Note (Addendum)
NF - will cont home meds - Imuran 50 mg BID resveratrol 100 mg daily and prednisone 5 mg daily

## 2016-02-15 NOTE — Assessment & Plan Note (Signed)
SNF - chronic and stable;cont prozac 20 mg

## 2016-02-15 NOTE — Assessment & Plan Note (Signed)
worsening. Will place on ferrous sulfate given recent blood loss from operation SNF - d/c Hb settled at 8.0 ;cont iron;will f/u CBC

## 2016-02-15 NOTE — Assessment & Plan Note (Signed)
SNF - chronic and stable ; cont ditropan and myrbetric

## 2016-02-15 NOTE — Assessment & Plan Note (Signed)
SNF - admit Cr 1.92, d/c Cr  2.66; will f/u BMP

## 2016-02-15 NOTE — Assessment & Plan Note (Signed)
SNF - stable-conmt prednisone 5 mg, zyrtec and prns

## 2016-02-15 NOTE — Assessment & Plan Note (Signed)
INR at 2.0 will reassess next am. SNF - will be actively titrating coumadin; pt was d/c with 2 different coumadin dosing regimens; per me pt will be on 5 mg M,W, F and 2.5 mg other days

## 2016-02-15 NOTE — Assessment & Plan Note (Signed)
SNF - controlled Aq1c 6.5 on diet;

## 2016-02-15 NOTE — Progress Notes (Signed)
MRN: [id number] Name: BIRD CRYMES  Sex: female Age: 82 y.o. DOB: 08/31/1932  [place] #: [facility] Facility/Room:105 Level Of Care: SNF Provider: Inocencio Homes D Emergency Contacts: Extended Emergency Contact Information Primary Emergency Contact: Boulden,Howard Address: [address]          [place] Phone: [phone] Mobile Phone: [phone] Relation: Spouse Secondary Emergency Contact: Franki Cabot, [place] Mobile Phone: [phone] Relation: Son  Code Status:   Allergies: Macrolides and ketolides; Aricept; Codeine; Morphine; Penicillin g; Penicillins; Statins; Streptomycin; Tetracycline; and Tetracyclines & related  Chief Complaint  Patient presents with  . New Admit To SNF    HPI: Patient is 82 y.o. female whorenal transplant with CKD 3, DVT, COPD, recurrent UTIs, and chronic diastolic CHF Upon arrival to the ED, patient is found to be saturating well on room air and with vital signs stable. Head CT was obtained and negative for acute intracranial abnormality. EKG demonstrates an arrhythmia, possibly atrial fibrillation. Chest x-ray is negative for acute cardiopulmonary disease but radiographs of the hip demonstrate a moderately angulated right femoral neck fracture. Pt was admitted to [facility] from 4/26-5/2 where she underwent r hip arthroplasty. She had post op anemia but transfusion was not required. Hospital course was complicated by AKI of pt's renal transplant. Pt is admitted to SNF for OT/PT. While at SNF pt will be followed for CHF, tx with metoprolol and lasix, hypothyroidism, tx with synthroid and kidney transplant tx with imuran, resveratrol and prednisone.  Past Medical History  Diagnosis Date  . Renal disorder   . Hx of kidney transplant     right  . Coronary artery disease   . Hypertension   . Thyroid disease   . Multiple allergies   . Abnormal gait   . Reflux   . Diverticulitis    . Hypothyroidism   . Depression   . Anxiety   . COPD, mild ([place])   . Dizziness   . Diabetes ([place])   . DVT (deep venous thrombosis) [facility])     Past Surgical History  Procedure Laterality Date  . Nephrectomy transplanted organ    . Tumor removal      Brain   . Anterior approach hemi hip arthroplasty Right 02/10/2016    Procedure: TOTAL HIP ARTHROPLASTY ANTERIOR APPROACH;  Surgeon: Rod Can, MD;  Location: [facility];  Service: Orthopedics;  Laterality: Right;      Medication List       This list is accurate as of: 02/15/16  9:55 PM.  Always use your most recent med list.               acetaminophen 325 MG tablet  Commonly known as:  TYLENOL  Take 2 tablets (650 mg total) by mouth every 6 (six) hours as needed for mild pain or moderate pain (or Fever >/= 101).     albuterol 108 (90 Base) MCG/ACT inhaler  Commonly known as:  PROVENTIL HFA;VENTOLIN HFA  Inhale 2 puffs into the lungs every 6 (six) hours as needed for wheezing or shortness of breath.     ALIGN 4 MG Caps  Take 4 mg by mouth every evening.     azaTHIOprine 50 MG tablet  Commonly known as:  IMURAN  Take 50 mg by mouth 2 (two) times daily.     b complex vitamins tablet  Take 1 tablet by mouth daily.  BIOTIN PO  Take 1 tablet by mouth every evening.     budesonide-formoterol 160-4.5 MCG/ACT inhaler  Commonly known as:  SYMBICORT  Inhale 1 puff into the lungs daily as needed (shortness of breath or wheezing).     cetirizine 10 MG tablet  Commonly known as:  ZYRTEC  Take 10 mg by mouth daily. Reported on 02/08/2016     cloNIDine 0.1 MG tablet  Commonly known as:  CATAPRES  Take 0.1 mg by mouth 2 (two) times daily.     colesevelam 625 MG tablet  Commonly known as:  WELCHOL  Take 1,875 mg by mouth 2 (two) times daily with a meal.     CoQ10 200 MG Caps  Take 200 mg by mouth daily.     cycloSPORINE modified 100 MG capsule  Commonly known as:  NEORAL  Take 100 mg by mouth 2 (two) times daily.      fenofibrate 48 MG tablet  Commonly known as:  TRICOR  Take 48 mg by mouth every evening.     Fish Oil 1000 MG Caps  Take 1,000 mg by mouth every evening.     FLUoxetine 20 MG tablet  Commonly known as:  PROZAC  Take 20 mg by mouth daily.     fluticasone 50 MCG/ACT nasal spray  Commonly known as:  FLONASE  Place 1 spray into both nostrils daily as needed for allergies or rhinitis.     furosemide 40 MG tablet  Commonly known as:  LASIX  Take 2 tablets (80 mg total) by mouth 2 (two) times daily. 80mg  in the morning and 40mg  in the evening     L-Lysine 500 MG Tabs  Take 500 mg by mouth daily.     levothyroxine 75 MCG tablet  Commonly known as:  SYNTHROID, LEVOTHROID  Take 75 mcg by mouth daily.     Lutein-Zeaxanthin 25-5 MG Caps  Take 1 capsule by mouth daily.     metoprolol succinate 100 MG 24 hr tablet  Commonly known as:  TOPROL-XL  Take 100 mg by mouth every evening. Reported on 02/10/2016     MOVE FREE PO  Take 1 tablet by mouth every evening.     multivitamin with minerals Tabs tablet  Take 1 tablet by mouth every evening.     MYRBETRIQ 25 MG Tb24 tablet  Generic drug:  mirabegron ER  Take 25 mg by mouth every evening.     omeprazole 20 MG capsule  Commonly known as:  PRILOSEC  Take 20 mg by mouth every evening.     oxybutynin 10 MG 24 hr tablet  Commonly known as:  DITROPAN-XL  Take 10 mg by mouth daily.     POTASSIUM GLUCONATE PO  Take 600 mcg by mouth every evening.     predniSONE 5 MG tablet  Commonly known as:  DELTASONE  Take 5 mg by mouth daily with breakfast.     Resveratrol 100 MG Caps  Take 100 mg by mouth every evening.     sulfamethoxazole-trimethoprim 400-80 MG tablet  Commonly known as:  BACTRIM,SEPTRA  Take 1 tablet by mouth 3 (three) times a week. Monday, Wednesday, and Friday mornings     Vitamin D 2000 units tablet  Take 2,000 Units by mouth every evening.     warfarin 1 MG tablet  Commonly known as:  COUMADIN  Take 3-4 mg by  mouth every evening. Take 4 tablets (4 mg) by mouth 1st day, then take 3 tablets (3 mg) for  2-3 days, then repeat     warfarin 5 MG tablet  Commonly known as:  COUMADIN  Alternate 2.5 mg and 5 mg daily.     Zinc 50 MG Tabs  Take 50 mg by mouth every evening.        No orders of the defined types were placed in this encounter.     There is no immunization history on file for this patient.  Social History  Substance Use Topics  . Smoking status: Never Smoker   . Smokeless tobacco: Never Used  . Alcohol Use: No    Family history is  + HLD, alzhemers dx  Review of Systems  DATA OBTAINED: from patient, nurse GENERAL:  no fevers, fatigue, appetite changes SKIN: No itching, rash or wounds EYES: No eye pain, redness, discharge EARS: No earache, tinnitus, change in hearing NOSE: No congestion, drainage or bleeding  MOUTH/THROAT: No mouth or tooth pain, No sore throat RESPIRATORY: No cough, wheezing, SOB CARDIAC: No chest pain, palpitations, lower extremity edema  GI: No abdominal pain, No N/V/D or constipation, No heartburn or reflux  GU: No dysuria, frequency or urgency, or incontinence  MUSCULOSKELETAL: No unrelieved bone/joint pain NEUROLOGIC: No headache, dizziness or focal weakness PSYCHIATRIC: No c/o anxiety or sadness   Filed Vitals:   02/15/16 1215  BP: 140/59  Pulse: 68  Temp: 98.5 F (36.9 C)  Resp: 18    SpO2 Readings from Last 1 Encounters:  02/14/16 94%        Physical Exam  GENERAL APPEARANCE: Alert, conversant,  No acute distress.  SKIN: No diaphoresis rash; incision area without redness , but with warmth, probably OK, will monitor HEAD: Normocephalic, atraumatic  EYES: Conjunctiva/lids clear. Pupils round, reactive. EOMs intact.  EARS: External exam WNL, canals clear. Hearing grossly normal.  NOSE: No deformity or discharge.  MOUTH/THROAT: Lips w/o lesions  RESPIRATORY: Breathing is even, unlabored. Lung sounds are clear   CARDIOVASCULAR:  Heart RRR no murmurs, rubs or gallops. No peripheral edema.   GASTROINTESTINAL: Abdomen is soft, non-tender, not distended w/ normal bowel sounds. GENITOURINARY: Bladder non tender, not distended  MUSCULOSKELETAL: No abnormal joints or musculature NEUROLOGIC:  Cranial nerves 2-12 grossly intact. Moves all extremities  PSYCHIATRIC: Mood and affect appropriate to situation, no behavioral issues  Patient Active Problem List   Diagnosis Date Noted  . DVT, lower extremity, proximal ([place]) 03/02/2014    Priority: Medium  . Postoperative anemia due to acute blood loss 02/15/2016  . Hyperlipidemia 02/15/2016  . Displaced fracture of right femoral neck ([place]) 02/10/2016  . Chronic diastolic CHF (congestive heart failure) ([place]) 02/08/2016  . Closed right hip fracture ([place]) 02/08/2016  . Hip fracture ([place]) 02/08/2016  . Acute diastolic CHF (congestive heart failure) ([place]) 05/27/2015  . Urinary incontinence 05/27/2015  . Escherichia coli urinary tract infection   . Other emphysema ([place])   . Hypokalemia   . Hypomagnesemia   . Sepsis secondary to UTI ([place]) 05/22/2015  . CKD (chronic kidney disease) stage 3, GFR 30-59 ml/min 03/02/2014  . Unspecified essential hypertension 03/02/2014  . DVT (deep venous thrombosis) ([place]) 03/01/2014  . Acute renal failure superimposed on stage 3 chronic kidney disease ([place]) 03/01/2014  . Gastroenteritis 11/22/2013  . Nausea & vomiting 11/22/2013  . Hypercalcemia 11/22/2013  . Infection due to ESBL-producing Escherichia coli 10/07/2013  . Septicemia due to E. coli ([place]) 10/07/2013  . Urinary tract infection 10/05/2013  . Sepsis ([place]) 10/05/2013  . History of renal transplant   . Hx of  kidney transplant   . Hypertension   . Thyroid disease   . Multiple allergies   . Abnormal gait   . Reflux   . Diverticulitis   . Hypothyroidism   . Depression   . Anxiety   . COPD (chronic obstructive pulmonary disease) ([place])   . Dizziness   . Diabetes type 2, controlled  ([place])     CBC    Component Value Date/Time   WBC 5.7 02/14/2016 0531   RBC 2.57* 02/14/2016 0531   HGB 8.0* 02/14/2016 0531   HCT 24.4* 02/14/2016 0531   PLT 214 02/14/2016 0531   MCV 94.9 02/14/2016 0531   LYMPHSABS 1.7 02/08/2016 1650   MONOABS 0.4 02/08/2016 1650   EOSABS 0.0 02/08/2016 1650   BASOSABS 0.0 02/08/2016 1650    CMP     Component Value Date/Time   NA 136 02/12/2016 0334   K 4.3 02/12/2016 0334   CL 106 02/12/2016 0334   CO2 22 02/12/2016 0334   GLUCOSE 128* 02/12/2016 0334   BUN 36* 02/12/2016 0334   CREATININE 2.66* 02/12/2016 0334   CALCIUM 9.1 02/12/2016 0334   PROT 5.4* 05/23/2015 0545   ALBUMIN 2.6* 05/23/2015 0545   AST 29 05/23/2015 0545   ALT 17 05/23/2015 0545   ALKPHOS 40 05/23/2015 0545   BILITOT 0.8 05/23/2015 0545   GFRNONAA 16* 02/12/2016 0334   GFRAA 18* 02/12/2016 0334    Lab Results  Component Value Date   HGBA1C 6.5* 02/08/2016     Dg Chest 1 View  02/08/2016  CLINICAL DATA:  Per EMS_ pt stood to go to the bathroom with her walker and lost her balance today. Pt noted to have shortening and rotation to right leg. No chest complaints. EXAM: CHEST 1 VIEW COMPARISON:  05/26/2015 FINDINGS: Normal cardiac silhouette. Calcified granulomas noted. No effusion, infiltrate pneumothorax. No acute osseous abnormality. IMPRESSION: No acute cardiopulmonary process. Electronically Signed   By: Suzy Bouchard M.D.   On: 02/08/2016 17:35   Dg Pelvis 1-2 Views  02/08/2016  CLINICAL DATA:  Right leg shortening after fall today. EXAM: PELVIS - 1-2 VIEW COMPARISON:  None. FINDINGS: Moderately angulated fracture involving the right femoral neck is noted. Left hip appears normal. Sacroiliac joints appear normal. IMPRESSION: Moderately angulated right femoral neck fracture. Electronically Signed   By: Marijo Conception, M.D.   On: 02/08/2016 17:39   Ct Head Wo Contrast  02/08/2016  CLINICAL DATA:  Dizziness, head laceration after fall. No loss of  consciousness. EXAM: CT HEAD WITHOUT CONTRAST TECHNIQUE: Contiguous axial images were obtained from the base of the skull through the vertex without intravenous contrast. COMPARISON:  CT scan of April 23, 2012. FINDINGS: Status post left occipital craniotomy. Minimal diffuse cortical atrophy is noted. Mild chronic ischemic white matter disease is noted. Stable 2.2 cm hyperdense mass is seen in left posterior fossa. Stable encephalomalacia of left cerebellar hemisphere is noted. There is no evidence of hemorrhage or acute infarction. Ventricular size is within normal limits. No mass effect or midline shift is noted. IMPRESSION: Stable postoperative changes are seen involving the left occipital skull and left cerebellar hemisphere with associated 2.2 cm hyperdense mass most consistent with meningioma. No significant changes noted compared to prior exam. Electronically Signed   By: Marijo Conception, M.D.   On: 02/08/2016 18:35   Dg Femur, Min 2 Views Right  02/08/2016  CLINICAL DATA:  Initial encounter for Per EMS_ pt stood to go to the bathroom with her walker and  lost her balance today. Pt noted to have shortening and rotation to right leg. No chest complaints. EXAM: RIGHT FEMUR 2 VIEWS COMPARISON:  Pelvic films of earlier today FINDINGS: Osteopenia. varus angulation involving mid femoral neck fracture. No dislocation. distal femur intact, with joint space narrowing involving the knee. No knee joint effusion. Artifact degradation about the distal femur. IMPRESSION: Mid femoral neck fracture, as before. Electronically Signed   By: Abigail Miyamoto M.D.   On: 02/08/2016 17:48    Not all labs, radiology exams or other studies done during hospitalization come through on my EPIC note; however they are reviewed by me.    Assessment and Plan  Displaced fracture of right femoral neck (HCC) S/p R hip arthroplasty; pt already on coumadin prior, lovenox bridge back to coumadin SNF - back on coumadin, will be actively  titrating; admitted for OT/PT  Postoperative anemia due to acute blood loss worsening. Will place on ferrous sulfate given recent blood loss from operation SNF - d/c Hb settled at 8.0 ;cont iron;will f/u CBC   DVT, lower extremity, proximal (HCC) INR at 2.0 will reassess next am. SNF - will be actively titrating coumadin; pt was d/c with 2 different coumadin dosing regimens; per me pt will be on 5 mg M,W, F and 2.5 mg other days   Acute diastolic CHF (congestive heart failure) SNF - reported stable;cont metoprolol and lasix  COPD (chronic obstructive pulmonary disease) (HCC) SNF - stable-conmt prednisone 5 mg, zyrtec and prns  Hypothyroidism SNF -  Not sytated as uncontrolled ;cont synthroid 75 mcg daily  Diabetes type 2, controlled ([place]) SNF - controlled Aq1c 6.5 on diet;  CKD (chronic kidney disease) stage 3, GFR 30-59 ml/min SNF - admit Cr 1.92, d/c Cr  2.66; will f/u BMP  Hx of kidney transplant NF - will cont home meds - Imuran 50 mg BID resveratrol 100 mg daily and prednisone 5 mg daily  Depression SNF - chronic and stable;cont prozac 20 mg  Hyperlipidemia SNF p stable;cont welchol, omega 3 and tricor  Urinary incontinence SNF - chronic and stable ; cont ditropan and myrbetric   Time spent > 45 min;> 50% of time with patient was spent reviewing records, labs, tests and studies, counseling and developing plan of care  Hennie Duos, MD

## 2016-02-15 NOTE — Assessment & Plan Note (Signed)
SNF -  Not sytated as uncontrolled ;cont synthroid 75 mcg daily

## 2016-02-15 NOTE — Assessment & Plan Note (Signed)
S/p R hip arthroplasty; pt already on coumadin prior, lovenox bridge back to coumadin SNF - back on coumadin, will be actively titrating; admitted for OT/PT

## 2016-02-15 NOTE — Assessment & Plan Note (Signed)
SNF - reported stable;cont metoprolol and lasix

## 2016-02-21 LAB — CBC AND DIFFERENTIAL
HEMATOCRIT: 26 % — AB (ref 36–46)
Hemoglobin: 8.4 g/dL — AB (ref 12.0–16.0)
PLATELETS: 452 10*3/uL — AB (ref 150–399)
WBC: 7.1 10^3/mL

## 2016-02-21 LAB — BASIC METABOLIC PANEL
BUN: 29 mg/dL — AB (ref 4–21)
Creatinine: 1.5 mg/dL — AB (ref 0.5–1.1)
GLUCOSE: 127 mg/dL
POTASSIUM: 4.5 mmol/L (ref 3.4–5.3)
SODIUM: 138 mmol/L (ref 137–147)

## 2016-03-16 ENCOUNTER — Non-Acute Institutional Stay (SKILLED_NURSING_FACILITY): Payer: Medicare Other | Admitting: Internal Medicine

## 2016-03-16 ENCOUNTER — Encounter: Payer: Self-pay | Admitting: Internal Medicine

## 2016-03-16 DIAGNOSIS — F32A Depression, unspecified: Secondary | ICD-10-CM

## 2016-03-16 DIAGNOSIS — F329 Major depressive disorder, single episode, unspecified: Secondary | ICD-10-CM | POA: Diagnosis not present

## 2016-03-16 DIAGNOSIS — E785 Hyperlipidemia, unspecified: Secondary | ICD-10-CM

## 2016-03-16 DIAGNOSIS — R32 Unspecified urinary incontinence: Secondary | ICD-10-CM | POA: Diagnosis not present

## 2016-03-16 NOTE — Progress Notes (Signed)
MRN: [id number] Name: Patricia [name]  Sex: female Age: 82 y.o. DOB: 07/02/1932  [place] #:  Facility/Room:[facility] / 105 P Level Of Care: SNF Provider: Noah Delaine. Sheppard Coil, MD Emergency Contacts: Extended Emergency Contact Information Primary Emergency Contact: [place],Howard Address: [place]          [place] Johnnette Litter of [place] Phone: [phone] Mobile Phone: [phone] Relation: Spouse Secondary Emergency Contact: Franki Cabot, [place] of [place] Mobile Phone: [phone] Relation: Son  Code Status: DNR  Allergies: Macrolides and ketolides; Aricept; Codeine; Morphine; Penicillin g; Penicillins; Statins; Streptomycin; Tetracycline; and Tetracyclines & related  Chief Complaint  Patient presents with  . Medical Management of Chronic Issues    Routine Visit    HPI: Patient is 82 y.o. female who was admitted for a hip fx who is being seen for routine issues of depression, urinary incontinence and HLD.  Past Medical History  Diagnosis Date  . Renal disorder   . Hx of kidney transplant     right  . Coronary artery disease   . Hypertension   . Thyroid disease   . Multiple allergies   . Abnormal gait   . Reflux   . Diverticulitis   . Hypothyroidism   . Depression   . Anxiety   . COPD, mild ([place])   . Dizziness   . Diabetes ([place])   . DVT (deep venous thrombosis) [facility])     Past Surgical History  Procedure Laterality Date  . Nephrectomy transplanted organ    . Tumor removal      Brain   . Anterior approach hemi hip arthroplasty Right 02/10/2016    Procedure: TOTAL HIP ARTHROPLASTY ANTERIOR APPROACH;  Surgeon: Rod Can, MD;  Location: [facility];  Service: Orthopedics;  Laterality: Right;      Medication List       This list is accurate as of: 03/16/16 11:59 PM.  Always use your most recent med list.               albuterol 108 (90 Base) MCG/ACT inhaler  Commonly known as:  PROVENTIL HFA;VENTOLIN HFA   Inhale 2 puffs into the lungs every 6 (six) hours as needed for wheezing or shortness of breath.     ALIGN 4 MG Caps  Take 4 mg by mouth every evening.     azaTHIOprine 50 MG tablet  Commonly known as:  IMURAN  Take 50 mg by mouth 2 (two) times daily.     b complex vitamins tablet  Take 1 tablet by mouth daily.     BIOTIN PO  Take 1 tablet by mouth every evening.     budesonide-formoterol 160-4.5 MCG/ACT inhaler  Commonly known as:  SYMBICORT  Inhale 1 puff into the lungs daily as needed (shortness of breath or wheezing).     cetirizine 10 MG tablet  Commonly known as:  ZYRTEC  Take 10 mg by mouth daily. Reported on 02/08/2016     cloNIDine 0.1 MG tablet  Commonly known as:  CATAPRES  Take 0.1 mg by mouth 2 (two) times daily.     colesevelam 625 MG tablet  Commonly known as:  WELCHOL  Take 1,875 mg by mouth 2 (two) times daily with a meal.     CoQ10 200 MG Caps  Take 200 mg by mouth daily.     cycloSPORINE modified 100 MG capsule  Commonly known as:  NEORAL  Take 100 mg  by mouth 2 (two) times daily.     fenofibrate 48 MG tablet  Commonly known as:  TRICOR  Take 48 mg by mouth every evening.     Fish Oil 1000 MG Caps  Take 1,000 mg by mouth every evening.     FLUoxetine 20 MG tablet  Commonly known as:  PROZAC  Take 20 mg by mouth daily.     fluticasone 50 MCG/ACT nasal spray  Commonly known as:  FLONASE  Place 1 spray into both nostrils daily as needed for allergies or rhinitis.     furosemide 40 MG tablet  Commonly known as:  LASIX  Give 2 tablets(80 mg)  in the morning daily then also give 1 tablet in the evening.     HYDROcodone-acetaminophen 5-325 MG tablet  Commonly known as:  NORCO/VICODIN  Take 1 tablet by mouth every 6 (six) hours as needed for moderate pain.     L-Lysine 500 MG Tabs  Take 500 mg by mouth daily.     levothyroxine 75 MCG tablet  Commonly known as:  SYNTHROID, LEVOTHROID  Take 75 mcg by mouth daily.     Lutein-Zeaxanthin 25-5  MG Caps  Take 1 capsule by mouth daily.     metoprolol succinate 100 MG 24 hr tablet  Commonly known as:  TOPROL-XL  Take 100 mg by mouth every evening. Reported on 02/10/2016     MOVE FREE PO  Take 1 tablet by mouth every evening.     multivitamin with minerals Tabs tablet  Take 1 tablet by mouth every evening.     MYRBETRIQ 25 MG Tb24 tablet  Generic drug:  mirabegron ER  Take 25 mg by mouth every evening.     omeprazole 20 MG capsule  Commonly known as:  PRILOSEC  Take 20 mg by mouth every evening.     oxybutynin 10 MG 24 hr tablet  Commonly known as:  DITROPAN-XL  Take 10 mg by mouth daily.     POTASSIUM GLUCONATE PO  Take 550 mg by mouth every evening.     predniSONE 5 MG tablet  Commonly known as:  DELTASONE  Take 5 mg by mouth daily with breakfast.     promethazine 25 MG tablet  Commonly known as:  PHENERGAN  Take 25 mg by mouth every 6 (six) hours as needed for nausea or vomiting.     Resveratrol 100 MG Caps  Take 100 mg by mouth every evening.     sulfamethoxazole-trimethoprim 400-80 MG tablet  Commonly known as:  BACTRIM,SEPTRA  Take 1 tablet by mouth 3 (three) times a week. Monday, Wednesday, and Friday mornings     Vitamin D 2000 units tablet  Take 2,000 Units by mouth every evening.     warfarin 1 MG tablet  Commonly known as:  COUMADIN  Take 1 mg table by mouth daily with 3 mg tablet to equal 3.5 mg     warfarin 3 MG tablet  Commonly known as:  COUMADIN  daily. Take one 3 mg tablet by mouth daily along with 1 mg tablet to equal 3.5 mg     Zinc 50 MG Tabs  Take 50 mg by mouth every evening.        Meds ordered this encounter  Medications  . DISCONTD: warfarin (COUMADIN) 3 MG tablet    Sig: daily. Take one 3 mg tablet by mouth daily along with 1 mg tablet to equal 3.5 mg  . promethazine (PHENERGAN) 25 MG tablet  Sig: Take 25 mg by mouth every 6 (six) hours as needed for nausea or vomiting.  Marland Kitchen HYDROcodone-acetaminophen (NORCO/VICODIN)  5-325 MG tablet    Sig: Take 1 tablet by mouth every 6 (six) hours as needed for moderate pain.   . furosemide (LASIX) 40 MG tablet    Sig: Give 2 tablets(80 mg)  in the morning daily then also give 1 tablet in the evening.    Immunization History  Administered Date(s) Administered  . PPD Test 02/14/2016    Social History  Substance Use Topics  . Smoking status: Never Smoker   . Smokeless tobacco: Never Used  . Alcohol Use: No    Review of Systems  DATA OBTAINED: from patient, nurse, medical record, family member GENERAL:  no fevers, fatigue, appetite changes SKIN: No itching, rash HEENT: No complaint RESPIRATORY: No cough, wheezing, SOB CARDIAC: No chest pain, palpitations, lower extremity edema  GI: No abdominal pain, No N/V/D or constipation, No heartburn or reflux  GU: No dysuria, frequency or urgency, or incontinence  MUSCULOSKELETAL: No unrelieved bone/joint pain NEUROLOGIC: No headache, dizziness  PSYCHIATRIC: No overt anxiety or sadness  Filed Vitals:   03/16/16 0852  BP: 132/68  Pulse: 54  Temp: 96.2 F (35.7 C)  Resp: 17    Physical Exam  GENERAL APPEARANCE: Alert, conversant, No acute distress  SKIN: No diaphoresis rash HEENT: Unremarkable RESPIRATORY: Breathing is even, unlabored. Lung sounds are clear   CARDIOVASCULAR: Heart RRR no murmurs, rubs or gallops. No peripheral edema  GASTROINTESTINAL: Abdomen is soft, non-tender, not distended w/ normal bowel sounds.  GENITOURINARY: Bladder non tender, not distended  MUSCULOSKELETAL: No abnormal joints or musculature NEUROLOGIC: Cranial nerves 2-12 grossly intact. Moves all extremities PSYCHIATRIC: Mood and affect appropriate to situation, no behavioral issues  Patient Active Problem List   Diagnosis Date Noted  . DVT, lower extremity, proximal ([place]) 03/02/2014    Priority: Medium  . Postoperative anemia due to acute blood loss 02/15/2016  . Hyperlipidemia 02/15/2016  . Displaced fracture of right  femoral neck ([place]) 02/10/2016  . Chronic diastolic CHF (congestive heart failure) ([place]) 02/08/2016  . Closed right hip fracture ([place]) 02/08/2016  . Hip fracture ([place]) 02/08/2016  . Acute diastolic CHF (congestive heart failure) ([place]) 05/27/2015  . Urinary incontinence 05/27/2015  . Escherichia coli urinary tract infection   . Other emphysema ([place])   . Hypokalemia   . Hypomagnesemia   . Sepsis secondary to UTI ([place]) 05/22/2015  . CKD (chronic kidney disease) stage 3, GFR 30-59 ml/min 03/02/2014  . Unspecified essential hypertension 03/02/2014  . DVT (deep venous thrombosis) ([place]) 03/01/2014  . Acute renal failure superimposed on stage 3 chronic kidney disease ([place]) 03/01/2014  . Gastroenteritis 11/22/2013  . Nausea & vomiting 11/22/2013  . Hypercalcemia 11/22/2013  . Infection due to ESBL-producing Escherichia coli 10/07/2013  . Septicemia due to E. coli ([place]) 10/07/2013  . Urinary tract infection 10/05/2013  . Sepsis ([place]) 10/05/2013  . History of renal transplant   . Hx of kidney transplant   . Hypertension   . Thyroid disease   . Multiple allergies   . Abnormal gait   . Reflux   . Diverticulitis   . Hypothyroidism   . Depression   . Anxiety   . COPD (chronic obstructive pulmonary disease) ([place])   . Dizziness   . Diabetes type 2, controlled ([place])     CBC    Component Value Date/Time   WBC 7.1 02/21/2016   WBC 5.7 02/14/2016 0531  RBC 2.57* 02/14/2016 0531   HGB 8.4* 02/21/2016   HCT 26* 02/21/2016   PLT 452* 02/21/2016   MCV 94.9 02/14/2016 0531   LYMPHSABS 1.7 02/08/2016 1650   MONOABS 0.4 02/08/2016 1650   EOSABS 0.0 02/08/2016 1650   BASOSABS 0.0 02/08/2016 1650    CMP     Component Value Date/Time   NA 138 02/21/2016   NA 136 02/12/2016 0334   K 4.5 02/21/2016   CL 106 02/12/2016 0334   CO2 22 02/12/2016 0334   GLUCOSE 128* 02/12/2016 0334   BUN 29* 02/21/2016   BUN 36* 02/12/2016 0334   CREATININE 1.5* 02/21/2016   CREATININE 2.66* 02/12/2016  0334   CALCIUM 9.1 02/12/2016 0334   PROT 5.4* 05/23/2015 0545   ALBUMIN 2.6* 05/23/2015 0545   AST 29 05/23/2015 0545   ALT 17 05/23/2015 0545   ALKPHOS 40 05/23/2015 0545   BILITOT 0.8 05/23/2015 0545   GFRNONAA 16* 02/12/2016 0334   GFRAA 18* 02/12/2016 0334    Assessment and Plan  Depression SNF - chronic and stable;cont prozac 20 mg  Hyperlipidemia SNF p stable;cont welchol, omega 3 and tricor  Urinary incontinence SNF - chronic and stable ; cont ditropan and myrbetric    Osias Resnick D. Sheppard Coil, MD

## 2016-03-21 LAB — TSH: TSH: 1.51 u[IU]/mL (ref 0.41–5.90)

## 2016-03-28 ENCOUNTER — Non-Acute Institutional Stay (SKILLED_NURSING_FACILITY): Payer: Medicare Other | Admitting: Internal Medicine

## 2016-03-28 ENCOUNTER — Encounter: Payer: Self-pay | Admitting: Internal Medicine

## 2016-03-28 DIAGNOSIS — J42 Unspecified chronic bronchitis: Secondary | ICD-10-CM | POA: Diagnosis not present

## 2016-03-28 DIAGNOSIS — I10 Essential (primary) hypertension: Secondary | ICD-10-CM | POA: Diagnosis not present

## 2016-03-28 DIAGNOSIS — F419 Anxiety disorder, unspecified: Secondary | ICD-10-CM

## 2016-03-28 DIAGNOSIS — D62 Acute posthemorrhagic anemia: Secondary | ICD-10-CM

## 2016-03-28 DIAGNOSIS — F329 Major depressive disorder, single episode, unspecified: Secondary | ICD-10-CM

## 2016-03-28 DIAGNOSIS — E038 Other specified hypothyroidism: Secondary | ICD-10-CM

## 2016-03-28 DIAGNOSIS — E119 Type 2 diabetes mellitus without complications: Secondary | ICD-10-CM | POA: Diagnosis not present

## 2016-03-28 DIAGNOSIS — N183 Chronic kidney disease, stage 3 unspecified: Secondary | ICD-10-CM

## 2016-03-28 DIAGNOSIS — Z940 Kidney transplant status: Secondary | ICD-10-CM

## 2016-03-28 DIAGNOSIS — I5032 Chronic diastolic (congestive) heart failure: Secondary | ICD-10-CM | POA: Diagnosis not present

## 2016-03-28 DIAGNOSIS — E785 Hyperlipidemia, unspecified: Secondary | ICD-10-CM

## 2016-03-28 DIAGNOSIS — I825Y2 Chronic embolism and thrombosis of unspecified deep veins of left proximal lower extremity: Secondary | ICD-10-CM

## 2016-03-28 DIAGNOSIS — S72001D Fracture of unspecified part of neck of right femur, subsequent encounter for closed fracture with routine healing: Secondary | ICD-10-CM

## 2016-03-28 DIAGNOSIS — F32A Depression, unspecified: Secondary | ICD-10-CM

## 2016-03-28 DIAGNOSIS — E034 Atrophy of thyroid (acquired): Secondary | ICD-10-CM

## 2016-03-28 NOTE — Progress Notes (Signed)
MRN: [id number] Name: Patricia [name]  Sex: female Age: 82 y.o. DOB: June 16, 1932  [place] #:  Facility/Room: [facility] / 105 Level Of Care: SNF Provider: Noah Delaine. Sheppard Coil, MD Emergency Contacts: Extended Emergency Contact Information Primary Emergency Contact: Caylor,Howard Address: [place]          [place] Johnnette Litter of [place] Phone: [phone] Mobile Phone: [phone] Relation: Spouse Secondary Emergency Contact: Franki Cabot, [place] of [place] Mobile Phone: [phone] Relation: Son  Code Status: DNR  Allergies: Macrolides and ketolides; Aricept; Codeine; Morphine; Penicillin g; Penicillins; Statins; Streptomycin; Tetracycline; and Tetracyclines & related  Chief Complaint  Patient presents with  . Discharge Note    Discharged from SNF    HPI: Patient is 82 y.o. female with renal transplant with CKD 3, DVT, COPD, recurrent UTIs, and chronic diastolic CHF Upon arrival to the ED, patient is found to be saturating well on room air and with vital signs stable. Head CT was obtained and negative for acute intracranial abnormality. EKG demonstrates an arrhythmia, possibly atrial fibrillation. Chest x-ray is negative for acute cardiopulmonary disease but radiographs of the hip demonstrate a moderately angulated right femoral neck fracture. Pt was admitted to [facility] from 4/26-5/2 where she underwent r hip arthroplasty. She had post op anemia but transfusion was not required. Hospital course was complicated by AKI of pt's renal transplant. Pt was admitted to SNF for OT/PT ad is now ready to be d/c to home.  Past Medical History  Diagnosis Date  . Renal disorder   . Hx of kidney transplant     right  . Coronary artery disease   . Hypertension   . Thyroid disease   . Multiple allergies   . Abnormal gait   . Reflux   . Diverticulitis   . Hypothyroidism   . Depression   . Anxiety   . COPD, mild ([place])   . Dizziness   . Diabetes  ([place])   . DVT (deep venous thrombosis) [facility])     Past Surgical History  Procedure Laterality Date  . Nephrectomy transplanted organ    . Tumor removal      Brain   . Anterior approach hemi hip arthroplasty Right 02/10/2016    Procedure: TOTAL HIP ARTHROPLASTY ANTERIOR APPROACH;  Surgeon: Rod Can, MD;  Location: [facility];  Service: Orthopedics;  Laterality: Right;      Medication List       This list is accurate as of: 03/28/16  3:41 PM.  Always use your most recent med list.               albuterol 108 (90 Base) MCG/ACT inhaler  Commonly known as:  PROVENTIL HFA;VENTOLIN HFA  Inhale 2 puffs into the lungs every 6 (six) hours as needed for wheezing or shortness of breath.     ALIGN 4 MG Caps  Take 4 mg by mouth every evening.     azaTHIOprine 50 MG tablet  Commonly known as:  IMURAN  Take 50 mg by mouth 2 (two) times daily.     b complex vitamins tablet  Take 1 tablet by mouth daily.     BIOTIN PO  Take 1 tablet by mouth every evening.     budesonide-formoterol 160-4.5 MCG/ACT inhaler  Commonly known as:  SYMBICORT  Inhale 1 puff into the lungs daily as needed (shortness of breath or wheezing).     cetirizine 10 MG tablet  Commonly known as:  ZYRTEC  Take 10 mg by mouth daily. Reported on 02/08/2016     cloNIDine 0.1 MG tablet  Commonly known as:  CATAPRES  Take 0.1 mg by mouth 2 (two) times daily.     colesevelam 625 MG tablet  Commonly known as:  WELCHOL  Take 1,875 mg by mouth 2 (two) times daily with a meal.     CoQ10 200 MG Caps  Take 200 mg by mouth daily.     cycloSPORINE modified 100 MG capsule  Commonly known as:  NEORAL  Take 100 mg by mouth 2 (two) times daily.     fenofibrate 48 MG tablet  Commonly known as:  TRICOR  Take 48 mg by mouth every evening.     Fish Oil 1000 MG Caps  Take 1,000 mg by mouth every evening.     FLUoxetine 20 MG tablet  Commonly known as:  PROZAC  Take 20 mg by mouth daily.     fluticasone 50 MCG/ACT nasal  spray  Commonly known as:  FLONASE  Place 1 spray into both nostrils daily as needed for allergies or rhinitis.     furosemide 40 MG tablet  Commonly known as:  LASIX  Give 2 tablets(80 mg)  in the morning daily then also give 1 tablet in the evening.     HYDROcodone-acetaminophen 5-325 MG tablet  Commonly known as:  NORCO/VICODIN  Take 1 tablet by mouth every 4 (four) hours as needed for moderate pain.     L-Lysine 500 MG Tabs  Take 500 mg by mouth daily.     levothyroxine 75 MCG tablet  Commonly known as:  SYNTHROID, LEVOTHROID  Take 75 mcg by mouth daily.     Lutein-Zeaxanthin 25-5 MG Caps  Take 1 capsule by mouth daily.     metoprolol succinate 100 MG 24 hr tablet  Commonly known as:  TOPROL-XL  Take 100 mg by mouth every evening. Reported on 02/10/2016     MOVE FREE PO  Take 1 tablet by mouth every evening.     multivitamin with minerals Tabs tablet  Take 1 tablet by mouth every evening.     MYRBETRIQ 25 MG Tb24 tablet  Generic drug:  mirabegron ER  Take 25 mg by mouth every evening.     omeprazole 20 MG capsule  Commonly known as:  PRILOSEC  Take 20 mg by mouth every evening.     oxybutynin 10 MG 24 hr tablet  Commonly known as:  DITROPAN-XL  Take 10 mg by mouth daily.     POTASSIUM GLUCONATE PO  Take 550 mg by mouth every evening.     predniSONE 5 MG tablet  Commonly known as:  DELTASONE  Take 5 mg by mouth daily with breakfast.     promethazine 25 MG tablet  Commonly known as:  PHENERGAN  Take 25 mg by mouth every 6 (six) hours as needed for nausea or vomiting.     Resveratrol 100 MG Caps  Take 100 mg by mouth every evening.     sulfamethoxazole-trimethoprim 400-80 MG tablet  Commonly known as:  BACTRIM,SEPTRA  Take 1 tablet by mouth 3 (three) times a week. Monday, Wednesday, and Friday mornings     Vitamin D 2000 units tablet  Take 2,000 Units by mouth every evening.     warfarin 2.5 MG tablet  Commonly known as:  COUMADIN  Take 2.5 mg by  mouth daily.     Zinc 50 MG Tabs  Take  50 mg by mouth every evening.        Meds ordered this encounter  Medications  . warfarin (COUMADIN) 2.5 MG tablet    Sig: Take 2.5 mg by mouth daily.    Immunization History  Administered Date(s) Administered  . PPD Test 02/14/2016    Social History  Substance Use Topics  . Smoking status: Never Smoker   . Smokeless tobacco: Never Used  . Alcohol Use: No    Filed Vitals:   03/28/16 0918  BP: 140/69  Pulse: 60  Temp: 97.1 F (36.2 C)  Resp: 18    Physical Exam  GENERAL APPEARANCE: Alert, conversant. No acute distress.  HEENT: Unremarkable. RESPIRATORY: Breathing is even, unlabored. Lung sounds are clear   CARDIOVASCULAR: Heart RRR no murmurs, rubs or gallops. No peripheral edema.  GASTROINTESTINAL: Abdomen is soft, non-tender, not distended w/ normal bowel sounds.  NEUROLOGIC: Cranial nerves 2-12 grossly intact. Moves all extremities  Patient Active Problem List   Diagnosis Date Noted  . DVT, lower extremity, proximal ([place]) 03/02/2014    Priority: Medium  . Postoperative anemia due to acute blood loss 02/15/2016  . Hyperlipidemia 02/15/2016  . Displaced fracture of right femoral neck ([place]) 02/10/2016  . Chronic diastolic CHF (congestive heart failure) ([place]) 02/08/2016  . Closed right hip fracture ([place]) 02/08/2016  . Hip fracture ([place]) 02/08/2016  . Acute diastolic CHF (congestive heart failure) ([place]) 05/27/2015  . Urinary incontinence 05/27/2015  . Escherichia coli urinary tract infection   . Other emphysema ([place])   . Hypokalemia   . Hypomagnesemia   . Sepsis secondary to UTI ([place]) 05/22/2015  . CKD (chronic kidney disease) stage 3, GFR 30-59 ml/min 03/02/2014  . Unspecified essential hypertension 03/02/2014  . DVT (deep venous thrombosis) ([place]) 03/01/2014  . Acute renal failure superimposed on stage 3 chronic kidney disease ([place]) 03/01/2014  . Gastroenteritis 11/22/2013  . Nausea & vomiting 11/22/2013  .  Hypercalcemia 11/22/2013  . Infection due to ESBL-producing Escherichia coli 10/07/2013  . Septicemia due to E. coli ([place]) 10/07/2013  . Urinary tract infection 10/05/2013  . Sepsis ([place]) 10/05/2013  . History of renal transplant   . Hx of kidney transplant   . Hypertension   . Thyroid disease   . Multiple allergies   . Abnormal gait   . Reflux   . Diverticulitis   . Hypothyroidism   . Depression   . Anxiety   . COPD (chronic obstructive pulmonary disease) ([place])   . Dizziness   . Diabetes type 2, controlled ([place])     CBC    Component Value Date/Time   WBC 7.1 02/21/2016   WBC 5.7 02/14/2016 0531   RBC 2.57* 02/14/2016 0531   HGB 8.4* 02/21/2016   HCT 26* 02/21/2016   PLT 452* 02/21/2016   MCV 94.9 02/14/2016 0531   LYMPHSABS 1.7 02/08/2016 1650   MONOABS 0.4 02/08/2016 1650   EOSABS 0.0 02/08/2016 1650   BASOSABS 0.0 02/08/2016 1650    CMP     Component Value Date/Time   NA 138 02/21/2016   NA 136 02/12/2016 0334   K 4.5 02/21/2016   CL 106 02/12/2016 0334   CO2 22 02/12/2016 0334   GLUCOSE 128* 02/12/2016 0334   BUN 29* 02/21/2016   BUN 36* 02/12/2016 0334   CREATININE 1.5* 02/21/2016   CREATININE 2.66* 02/12/2016 0334   CALCIUM 9.1 02/12/2016 0334   PROT 5.4* 05/23/2015 0545   ALBUMIN 2.6* 05/23/2015 0545   AST 29 05/23/2015 0545  ALT 17 05/23/2015 0545   ALKPHOS 40 05/23/2015 0545   BILITOT 0.8 05/23/2015 0545   GFRNONAA 16* 02/12/2016 0334   GFRAA 18* 02/12/2016 0334    Assessment and Plan  Pt is d/c to home with HH/OT/PT. Meds have been reconciled and Rx's written.   Time spent > 30 min Ravonda Brecheen D. Sheppard Coil, MD

## 2016-04-06 ENCOUNTER — Encounter: Payer: Self-pay | Admitting: Internal Medicine

## 2017-02-21 ENCOUNTER — Emergency Department (HOSPITAL_COMMUNITY)
Admission: EM | Admit: 2017-02-21 | Discharge: 2017-02-21 | Disposition: A | Discharge status: Home / Self Care | Payer: Medicare Other | Attending: Emergency Medicine | Admitting: Emergency Medicine

## 2017-02-21 ENCOUNTER — Encounter (HOSPITAL_COMMUNITY): Payer: Self-pay | Admitting: Nurse Practitioner

## 2017-02-21 ENCOUNTER — Emergency Department (HOSPITAL_BASED_OUTPATIENT_CLINIC_OR_DEPARTMENT_OTHER)
Admit: 2017-02-21 | Discharge: 2017-02-21 | Disposition: A | Discharge status: Home / Self Care | Payer: Medicare Other | Attending: Emergency Medicine | Admitting: Emergency Medicine

## 2017-02-21 DIAGNOSIS — E1122 Type 2 diabetes mellitus with diabetic chronic kidney disease: Secondary | ICD-10-CM | POA: Diagnosis not present

## 2017-02-21 DIAGNOSIS — M79609 Pain in unspecified limb: Secondary | ICD-10-CM | POA: Diagnosis not present

## 2017-02-21 DIAGNOSIS — Z7901 Long term (current) use of anticoagulants: Secondary | ICD-10-CM | POA: Diagnosis not present

## 2017-02-21 DIAGNOSIS — Z79899 Other long term (current) drug therapy: Secondary | ICD-10-CM | POA: Insufficient documentation

## 2017-02-21 DIAGNOSIS — E039 Hypothyroidism, unspecified: Secondary | ICD-10-CM | POA: Insufficient documentation

## 2017-02-21 DIAGNOSIS — I130 Hypertensive heart and chronic kidney disease with heart failure and stage 1 through stage 4 chronic kidney disease, or unspecified chronic kidney disease: Secondary | ICD-10-CM | POA: Insufficient documentation

## 2017-02-21 DIAGNOSIS — M79604 Pain in right leg: Secondary | ICD-10-CM

## 2017-02-21 DIAGNOSIS — J449 Chronic obstructive pulmonary disease, unspecified: Secondary | ICD-10-CM | POA: Diagnosis not present

## 2017-02-21 DIAGNOSIS — I2510 Atherosclerotic heart disease of native coronary artery without angina pectoris: Secondary | ICD-10-CM | POA: Diagnosis not present

## 2017-02-21 DIAGNOSIS — N183 Chronic kidney disease, stage 3 (moderate): Secondary | ICD-10-CM | POA: Insufficient documentation

## 2017-02-21 DIAGNOSIS — I5031 Acute diastolic (congestive) heart failure: Secondary | ICD-10-CM | POA: Diagnosis not present

## 2017-02-21 MED ORDER — HYDROCODONE-ACETAMINOPHEN 5-325 MG PO TABS: 1.0000 | ORAL_TABLET | Freq: Four times a day (QID) | ORAL | 0 refills | Status: DC | PRN

## 2017-02-21 NOTE — Discharge Instructions (Signed)
The ultrasound relates they did not show any new blood clots. Take the hydrocodone as directed and follow-up with your Dr. next week

## 2017-02-21 NOTE — Progress Notes (Signed)
**  Preliminary report by tech**  Right lower extremity venous duplex complete. There is evidence of chronic deep vein thrombosis involving the distal femoral, and popliteal veins of the right lower extremity. There is no evidence of superficial vein thrombosis involving the right lower extremity. Incidental findings are consistent with: Baker's Cyst measuring 1.5 cm high by 3.1 cm wide by 4.6 cm long on the right. Results were given to [facility].  02/21/17 7:01 PM Carlos Levering RVT

## 2017-02-21 NOTE — ED Provider Notes (Signed)
[facility] DEPT Provider Note   CSN: [id number] Arrival date & time: 02/21/17  1533     History   Chief Complaint Chief Complaint  Patient presents with  . Leg Pain    HPI Patricia [name] is a 83 y.o. female.  83 year old female presents with worsening chronic right lower extremity pain. Does have a prior history of DVT and denies any chest pain or shortness of breath. Pain is along the entire leg and characterized as dull and aching and worse when she stands. Also has a history of osteoarthritis of the right knee but denies any new edema or erythema or fever or chills. Saw her doctor today who sent her here for a DVT study.      Past Medical History:  Diagnosis Date  . Abnormal gait   . Anxiety   . COPD, mild ([place])   . Coronary artery disease   . Depression   . Diabetes ([place])   . Diverticulitis   . Dizziness   . DVT (deep venous thrombosis) ([place])   . Hx of kidney transplant    right  . Hypertension   . Hypothyroidism   . Multiple allergies   . Reflux   . Renal disorder   . Thyroid disease     Patient Active Problem List   Diagnosis Date Noted  . Postoperative anemia due to acute blood loss 02/15/2016  . Hyperlipidemia 02/15/2016  . Displaced fracture of right femoral neck ([place]) 02/10/2016  . Chronic diastolic CHF (congestive heart failure) ([place]) 02/08/2016  . Closed right hip fracture ([employer]) 02/08/2016  . Hip fracture ([place]) 02/08/2016  . Acute diastolic CHF (congestive heart failure) ([place]) 05/27/2015  . Urinary incontinence 05/27/2015  . Escherichia coli urinary tract infection   . Other emphysema ([place])   . Hypokalemia   . Hypomagnesemia   . Sepsis secondary to UTI ([place]) 05/22/2015  . DVT, lower extremity, proximal ([place]) 03/02/2014  . CKD (chronic kidney disease) stage 3, GFR 30-59 ml/min 03/02/2014  . Essential hypertension 03/02/2014  . DVT (deep venous thrombosis) ([place]) 03/01/2014  . Acute renal failure superimposed on stage 3 chronic kidney  disease ([place]) 03/01/2014  . Gastroenteritis 11/22/2013  . Nausea & vomiting 11/22/2013  . Hypercalcemia 11/22/2013  . Infection due to ESBL-producing Escherichia coli 10/07/2013  . Septicemia due to E. coli ([place]) 10/07/2013  . Urinary tract infection 10/05/2013  . Sepsis ([place]) 10/05/2013  . History of renal transplant   . Hx of kidney transplant   . Hypertension   . Thyroid disease   . Multiple allergies   . Abnormal gait   . Reflux   . Diverticulitis   . Hypothyroidism   . Depression   . Anxiety   . COPD (chronic obstructive pulmonary disease) ([place])   . Dizziness   . Diabetes type 2, controlled [facility])     Past Surgical History:  Procedure Laterality Date  . ANTERIOR APPROACH HEMI HIP ARTHROPLASTY Right 02/10/2016   Procedure: TOTAL HIP ARTHROPLASTY ANTERIOR APPROACH;  Surgeon: Rod Can, MD;  Location: [facility];  Service: Orthopedics;  Laterality: Right;  . NEPHRECTOMY TRANSPLANTED ORGAN    . TUMOR REMOVAL     Brain     OB History    No data available       Home Medications    Prior to Admission medications   Medication Sig Start Date End Date Taking? Authorizing Provider  albuterol (PROVENTIL HFA;VENTOLIN HFA) 108 (90 BASE) MCG/ACT inhaler Inhale 2 puffs into the lungs every  6 (six) hours as needed for wheezing or shortness of breath.     [provider]  azaTHIOprine (IMURAN) 50 MG tablet Take 50 mg by mouth 2 (two) times daily.     [provider]  b complex vitamins tablet Take 1 tablet by mouth daily.    [provider]  BIOTIN PO Take 1 tablet by mouth every evening.    [provider]  budesonide-formoterol (SYMBICORT) 160-4.5 MCG/ACT inhaler Inhale 1 puff into the lungs daily as needed (shortness of breath or wheezing).     [provider]  cetirizine (ZYRTEC) 10 MG tablet Take 10 mg by mouth daily. Reported on 02/08/2016    [provider]  Cholecalciferol (VITAMIN D) 2000 UNITS tablet Take 2,000 Units by  mouth every evening.     [provider]  cloNIDine (CATAPRES) 0.1 MG tablet Take 0.1 mg by mouth 2 (two) times daily.    [provider]  Coenzyme Q10 (COQ10) 200 MG CAPS Take 200 mg by mouth daily.     [provider]  colesevelam (WELCHOL) 625 MG tablet Take 1,875 mg by mouth 2 (two) times daily with a meal.    [provider]  cycloSPORINE modified (NEORAL) 100 MG capsule Take 100 mg by mouth 2 (two) times daily.     [provider]  fenofibrate (TRICOR) 48 MG tablet Take 48 mg by mouth every evening.     [provider]  FLUoxetine (PROZAC) 20 MG tablet Take 20 mg by mouth daily.     [provider]  fluticasone (FLONASE) 50 MCG/ACT nasal spray Place 1 spray into both nostrils daily as needed for allergies or rhinitis.    [provider]  furosemide (LASIX) 40 MG tablet Give 2 tablets(80 mg)  in the morning daily then also give 1 tablet in the evening.    [provider]  Glucosamine-Chondroitin (MOVE FREE PO) Take 1 tablet by mouth every evening.    [provider]  HYDROcodone-acetaminophen (NORCO/VICODIN) 5-325 MG tablet Take 1 tablet by mouth every 6 (six) hours as needed for moderate pain.     [provider]  L-Lysine 500 MG TABS Take 500 mg by mouth daily.    [provider]  levothyroxine (SYNTHROID, LEVOTHROID) 75 MCG tablet Take 75 mcg by mouth daily.    [provider]  Lutein-Zeaxanthin 25-5 MG CAPS Take 1 capsule by mouth daily.    [provider]  metoprolol succinate (TOPROL-XL) 100 MG 24 hr tablet Take 100 mg by mouth every evening. Reported on 02/10/2016    [provider]  mirabegron ER (MYRBETRIQ) 25 MG TB24 tablet Take 25 mg by mouth every evening.     [provider]  Multiple Vitamin (MULTIVITAMIN WITH MINERALS) TABS tablet Take 1 tablet by mouth every evening.    [provider]  Omega-3 Fatty Acids (FISH OIL) 1000 MG CAPS  Take 1,000 mg by mouth every evening.    [provider]  omeprazole (PRILOSEC) 20 MG capsule Take 20 mg by mouth every evening.     [provider]  oxybutynin (DITROPAN-XL) 10 MG 24 hr tablet Take 10 mg by mouth daily.     [provider]  POTASSIUM GLUCONATE PO Take 550 mg by mouth every evening.     [provider]  predniSONE (DELTASONE) 5 MG tablet Take 5 mg by mouth daily with breakfast.    [provider]  Probiotic Product (ALIGN) 4 MG CAPS  Take 4 mg by mouth every evening.     [provider]  promethazine (PHENERGAN) 25 MG tablet Take 25 mg by mouth every 6 (six) hours as needed for nausea or vomiting.    [provider]  Resveratrol 100 MG CAPS Take 100 mg by mouth every evening.     [provider]  sulfamethoxazole-trimethoprim (BACTRIM,SEPTRA) 400-80 MG per tablet Take 1 tablet by mouth 3 (three) times a week. Monday, Wednesday, and Friday mornings    [provider]  warfarin (COUMADIN) 2.5 MG tablet Take 2.5 mg by mouth daily.    [provider]  Zinc 50 MG TABS Take 50 mg by mouth every evening.    [provider]    Family History Family History  Problem Relation Age of Onset  . Alzheimer's disease Mother   . High Cholesterol Mother   . Alcoholism Father     Social History Social History  Substance Use Topics  . Smoking status: Never Smoker  . Smokeless tobacco: Never Used  . Alcohol use No     Allergies   Macrolides and ketolides; Aricept [donepezil hcl]; Codeine; Morphine; Penicillin g; Penicillins; Statins; Streptomycin; Tetracycline; and Tetracyclines & related   Review of Systems Review of Systems  All other systems reviewed and are negative.    Physical Exam Updated Vital Signs BP (!) 190/70 (BP Location: Right Arm)   Pulse (!) 56   Temp 98.4 F (36.9 C) (Oral)   Resp 18   SpO2 99%   Physical Exam  Constitutional: She is oriented to person,  place, and time. She appears well-developed and well-nourished.  Non-toxic appearance. No distress.  HENT:  Head: Normocephalic and atraumatic.  Eyes: Conjunctivae, EOM and lids are normal. Pupils are equal, round, and reactive to light.  Neck: Normal range of motion. Neck supple. No tracheal deviation present. No thyroid mass present.  Cardiovascular: Normal rate, regular rhythm and normal heart sounds.  Exam reveals no gallop.   No murmur heard. Pulmonary/Chest: Effort normal and breath sounds normal. No stridor. No respiratory distress. She has no decreased breath sounds. She has no wheezes. She has no rhonchi. She has no rales.  Abdominal: Soft. Normal appearance and bowel sounds are normal. She exhibits no distension. There is no tenderness. There is no rebound and no CVA tenderness.  Musculoskeletal: Normal range of motion. She exhibits no edema or tenderness.  Right lower extremity tender at the thigh and knee. Knee has full range of motion. No erythema or warmest to the joint. No pain with internal and external rotation of the hip. Neurovascular status is intact at the right foot. Negative Homans sign  Neurological: She is alert and oriented to person, place, and time. She has normal strength. No cranial nerve deficit or sensory deficit. GCS eye subscore is 4. GCS verbal subscore is 5. GCS motor subscore is 6.  Skin: Skin is warm and dry. No abrasion and no rash noted.  Psychiatric: She has a normal mood and affect. Her speech is normal and behavior is normal.  Nursing note and vitals reviewed.    ED Treatments / Results  Labs (all labs ordered are listed, but only abnormal results are displayed) Labs Reviewed - No data to display  EKG  EKG Interpretation None       Radiology No results found.  Procedures Procedures (including critical care time)  Medications Ordered in ED Medications - No data to display   Initial Impression / Assessment and Plan / ED  Course  I have  reviewed the triage vital signs and the nursing notes.  Pertinent labs & imaging results that were available during my care of the patient were reviewed by me and considered in my medical decision making (see chart for details).     Patient has no evidence of septic arthritis of her right knee. She had a Doppler of her right lower extremity which was negative for acute DVT. Compartments in the right thigh and right calf are soft. She does have a prior history of DVT in the right leg. Suspect her symptoms are from osteoarthritis of her right knee. Will be prescribed hydrocodone and instructed to follow-up with her doctor.  Final Clinical Impressions(s) / ED Diagnoses   Final diagnoses:  None    New Prescriptions New Prescriptions   No medications on file     Lacretia Leigh, MD 02/21/17 2105

## 2017-02-21 NOTE — ED Triage Notes (Signed)
Pt presents with c/o R leg pain. The pain began about 2 days ago. The pain is an intermittent cramping pain radiating down her entire leg. She was seen at her PCP today for the pain and sent to ED to r/o DVT due to hx DVT and suspicious exam in the office

## 2017-02-21 NOTE — ED Notes (Signed)
Negative acute DVT, positive for chronic dvt in distal femoral vein and popliteal vein, and cyst per vascular

## 2017-02-21 NOTE — ED Notes (Signed)
MD at bedside. 

## 2017-05-18 ENCOUNTER — Emergency Department (HOSPITAL_COMMUNITY)
Admission: EM | Admit: 2017-05-18 | Discharge: 2017-05-18 | Disposition: A | Discharge status: Home / Self Care | Payer: Medicare Other | Attending: Emergency Medicine | Admitting: Emergency Medicine

## 2017-05-18 ENCOUNTER — Encounter (HOSPITAL_COMMUNITY): Payer: Self-pay | Admitting: Emergency Medicine

## 2017-05-18 ENCOUNTER — Emergency Department (HOSPITAL_COMMUNITY): Payer: Medicare Other

## 2017-05-18 DIAGNOSIS — N183 Chronic kidney disease, stage 3 (moderate): Secondary | ICD-10-CM | POA: Insufficient documentation

## 2017-05-18 DIAGNOSIS — Y9301 Activity, walking, marching and hiking: Secondary | ICD-10-CM | POA: Insufficient documentation

## 2017-05-18 DIAGNOSIS — Y999 Unspecified external cause status: Secondary | ICD-10-CM | POA: Diagnosis not present

## 2017-05-18 DIAGNOSIS — Z79899 Other long term (current) drug therapy: Secondary | ICD-10-CM | POA: Diagnosis not present

## 2017-05-18 DIAGNOSIS — E1122 Type 2 diabetes mellitus with diabetic chronic kidney disease: Secondary | ICD-10-CM | POA: Diagnosis not present

## 2017-05-18 DIAGNOSIS — Z86011 Personal history of benign neoplasm of the brain: Secondary | ICD-10-CM | POA: Insufficient documentation

## 2017-05-18 DIAGNOSIS — J449 Chronic obstructive pulmonary disease, unspecified: Secondary | ICD-10-CM | POA: Diagnosis not present

## 2017-05-18 DIAGNOSIS — I129 Hypertensive chronic kidney disease with stage 1 through stage 4 chronic kidney disease, or unspecified chronic kidney disease: Secondary | ICD-10-CM | POA: Insufficient documentation

## 2017-05-18 DIAGNOSIS — T148XXA Other injury of unspecified body region, initial encounter: Secondary | ICD-10-CM | POA: Insufficient documentation

## 2017-05-18 DIAGNOSIS — W19XXXA Unspecified fall, initial encounter: Secondary | ICD-10-CM

## 2017-05-18 DIAGNOSIS — W01198A Fall on same level from slipping, tripping and stumbling with subsequent striking against other object, initial encounter: Secondary | ICD-10-CM | POA: Insufficient documentation

## 2017-05-18 DIAGNOSIS — I110 Hypertensive heart disease with heart failure: Secondary | ICD-10-CM | POA: Insufficient documentation

## 2017-05-18 DIAGNOSIS — I5043 Acute on chronic combined systolic (congestive) and diastolic (congestive) heart failure: Secondary | ICD-10-CM | POA: Insufficient documentation

## 2017-05-18 DIAGNOSIS — Y92013 Bedroom of single-family (private) house as the place of occurrence of the external cause: Secondary | ICD-10-CM | POA: Insufficient documentation

## 2017-05-18 DIAGNOSIS — E039 Hypothyroidism, unspecified: Secondary | ICD-10-CM | POA: Diagnosis not present

## 2017-05-18 DIAGNOSIS — T07XXXA Unspecified multiple injuries, initial encounter: Secondary | ICD-10-CM

## 2017-05-18 LAB — PROTIME-INR
INR: 2.03
PROTHROMBIN TIME: 23.3 s — AB (ref 11.4–15.2)

## 2017-05-18 MED ORDER — PROTHROMBIN COMPLEX CONC HUMAN 500 UNITS IV KIT
1691.0000 [IU] | PACK | Status: DC
  Filled 2017-05-18: qty 68

## 2017-05-18 MED ORDER — VITAMIN K1 10 MG/ML IJ SOLN
10.0000 mg | INTRAVENOUS | Status: DC
  Filled 2017-05-18: qty 1

## 2017-05-18 NOTE — ED Triage Notes (Addendum)
Fall at home-- to ed via [facility]-- pt states she tripped, hit right leg and hands on closet and bed, did not hit head- skin tears to right hand, left little finger, and right lower leg. Denies LOC, pt is alert/oriented x 4 on arrival, skin w/d.  Pt has hx of kidney transplant, old grafts in bilat upper arms,

## 2017-05-18 NOTE — Discharge Instructions (Signed)
Keep your wounds clean and dry. Wash twice daily with soap and water. Follow up with your primary care provider for further discussion of today's hospital visit.  Return to ER for new or worsening symptoms, any additional concerns.

## 2017-05-18 NOTE — ED Notes (Signed)
Pt ambulated in hallway with walker. No issues or complaints.

## 2017-05-18 NOTE — ED Provider Notes (Signed)
[facility] DEPT Provider Note   CSN: [id number] Arrival date & time: 05/18/17  1248     History   Chief Complaint No chief complaint on file.   HPI Patricia [name] is a 83 y.o. female.  The history is provided by the patient and medical records. No language interpreter was used.   Patricia [name] is a 83 y.o. female  with a PMH of DVT currently on coumadin who presents to the Emergency Department for evaluation after mechanical fall. Patient states that she was at home using her rolling walker, trying to reach into her closet when she stumbled and fell onto her left hip. She believes that she struck her leg on some boxes or furniture. She also hit the dorsum of her hand against something as well. No headache or neck pain. No loss of consciousness. Fall was unwitnessed and patient does not believe that she hit her head, but not certain. No medications or treatments prior to arrival for symptoms. Tetanus up-to-date. Ambulates with rolling walker at baseline.   Past Medical History:  Diagnosis Date  . Abnormal gait   . Anxiety   . COPD, mild ([place])   . Coronary artery disease   . Depression   . Diabetes ([place])   . Diverticulitis   . Dizziness   . DVT (deep venous thrombosis) ([place])   . Hx of kidney transplant    right  . Hypertension   . Hypothyroidism   . Multiple allergies   . Reflux   . Renal disorder   . Thyroid disease     Patient Active Problem List   Diagnosis Date Noted  . Postoperative anemia due to acute blood loss 02/15/2016  . Hyperlipidemia 02/15/2016  . Displaced fracture of right femoral neck ([place]) 02/10/2016  . Chronic diastolic CHF (congestive heart failure) ([place]) 02/08/2016  . Closed right hip fracture ([place]) 02/08/2016  . Hip fracture ([place]) 02/08/2016  . Acute diastolic CHF (congestive heart failure) ([place]) 05/27/2015  . Urinary incontinence 05/27/2015  . Escherichia coli urinary tract infection   . Other emphysema ([place])   . Hypokalemia   .  Hypomagnesemia   . Sepsis secondary to UTI ([place]) 05/22/2015  . DVT, lower extremity, proximal ([place]) 03/02/2014  . CKD (chronic kidney disease) stage 3, GFR 30-59 ml/min 03/02/2014  . Essential hypertension 03/02/2014  . DVT (deep venous thrombosis) ([place]) 03/01/2014  . Acute renal failure superimposed on stage 3 chronic kidney disease ([place]) 03/01/2014  . Gastroenteritis 11/22/2013  . Nausea & vomiting 11/22/2013  . Hypercalcemia 11/22/2013  . Infection due to ESBL-producing Escherichia coli 10/07/2013  . Septicemia due to E. coli ([place]) 10/07/2013  . Urinary tract infection 10/05/2013  . Sepsis ([place]) 10/05/2013  . History of renal transplant   . Hx of kidney transplant   . Hypertension   . Thyroid disease   . Multiple allergies   . Abnormal gait   . Reflux   . Diverticulitis   . Hypothyroidism   . Depression   . Anxiety   . COPD (chronic obstructive pulmonary disease) ([place])   . Dizziness   . Diabetes type 2, controlled [facility])     Past Surgical History:  Procedure Laterality Date  . ANTERIOR APPROACH HEMI HIP ARTHROPLASTY Right 02/10/2016   Procedure: TOTAL HIP ARTHROPLASTY ANTERIOR APPROACH;  Surgeon: Rod Can, MD;  Location: [facility];  Service: Orthopedics;  Laterality: Right;  . NEPHRECTOMY TRANSPLANTED ORGAN    . TUMOR REMOVAL     Brain  OB History    No data available       Home Medications    Prior to Admission medications   Medication Sig Start Date End Date Taking? Authorizing Provider  albuterol (PROVENTIL HFA;VENTOLIN HFA) 108 (90 BASE) MCG/ACT inhaler Inhale 2 puffs into the lungs every 6 (six) hours as needed for wheezing or shortness of breath.     [provider]  azaTHIOprine (IMURAN) 50 MG tablet Take 50 mg by mouth 2 (two) times daily.     [provider]  b complex vitamins tablet Take 1 tablet by mouth daily.    [provider]  BIOTIN PO Take 1 tablet by mouth every evening.    [provider]    budesonide-formoterol (SYMBICORT) 160-4.5 MCG/ACT inhaler Inhale 1 puff into the lungs daily as needed (shortness of breath or wheezing).     [provider]  cetirizine (ZYRTEC) 10 MG tablet Take 10 mg by mouth daily. Reported on 02/08/2016    [provider]  Cholecalciferol (VITAMIN D) 2000 UNITS tablet Take 2,000 Units by mouth every evening.     [provider]  cloNIDine (CATAPRES) 0.1 MG tablet Take 0.1 mg by mouth 2 (two) times daily.    [provider]  Coenzyme Q10 (COQ10) 200 MG CAPS Take 200 mg by mouth daily.     [provider]  colesevelam (WELCHOL) 625 MG tablet Take 1,875 mg by mouth 2 (two) times daily with a meal.    [provider]  cycloSPORINE modified (NEORAL) 100 MG capsule Take 100 mg by mouth 2 (two) times daily.     [provider]  fenofibrate (TRICOR) 48 MG tablet Take 48 mg by mouth every evening.     [provider]  FLUoxetine (PROZAC) 20 MG tablet Take 20 mg by mouth daily.     [provider]  fluticasone (FLONASE) 50 MCG/ACT nasal spray Place 1 spray into both nostrils daily as needed for allergies or rhinitis.    [provider]  furosemide (LASIX) 40 MG tablet Give 2 tablets(80 mg)  in the morning daily then also give 1 tablet in the evening.    [provider]  Glucosamine-Chondroitin (MOVE FREE PO) Take 1 tablet by mouth every evening.    [provider]  HYDROcodone-acetaminophen (NORCO/VICODIN) 5-325 MG tablet Take 1 tablet by mouth every 6 (six) hours as needed for moderate pain.     [provider]  HYDROcodone-acetaminophen (NORCO/VICODIN) 5-325 MG tablet Take 1-2 tablets by mouth every 6 (six) hours as needed. 02/21/17   Lacretia Leigh, MD  L-Lysine 500 MG TABS Take 500 mg by mouth daily.    [provider]  levothyroxine (SYNTHROID, LEVOTHROID) 75 MCG tablet Take 75 mcg by mouth daily.    [provider]  Lutein-Zeaxanthin  25-5 MG CAPS Take 1 capsule by mouth daily.    [provider]  metoprolol succinate (TOPROL-XL) 100 MG 24 hr tablet Take 100 mg by mouth every evening. Reported on 02/10/2016    [provider]  mirabegron ER (MYRBETRIQ) 25 MG TB24 tablet Take 25 mg by mouth every evening.     [provider]  Multiple Vitamin (MULTIVITAMIN WITH MINERALS) TABS tablet Take 1 tablet by mouth every evening.    [provider]  Omega-3 Fatty Acids (FISH OIL) 1000 MG CAPS Take 1,000 mg by mouth every evening.    [provider]  omeprazole (PRILOSEC) 20 MG capsule Take 20 mg by mouth  every evening.     [provider]  oxybutynin (DITROPAN-XL) 10 MG 24 hr tablet Take 10 mg by mouth daily.     [provider]  POTASSIUM GLUCONATE PO Take 550 mg by mouth every evening.     [provider]  predniSONE (DELTASONE) 5 MG tablet Take 5 mg by mouth daily with breakfast.    [provider]  Probiotic Product (ALIGN) 4 MG CAPS Take 4 mg by mouth every evening.     [provider]  promethazine (PHENERGAN) 25 MG tablet Take 25 mg by mouth every 6 (six) hours as needed for nausea or vomiting.    [provider]  Resveratrol 100 MG CAPS Take 100 mg by mouth every evening.     [provider]  sulfamethoxazole-trimethoprim (BACTRIM,SEPTRA) 400-80 MG per tablet Take 1 tablet by mouth 3 (three) times a week. Monday, Wednesday, and Friday mornings    [provider]  warfarin (COUMADIN) 2.5 MG tablet Take 2.5 mg by mouth daily.    [provider]  Zinc 50 MG TABS Take 50 mg by mouth every evening.    [provider]    Family History Family History  Problem Relation Age of Onset  . Alzheimer's disease Mother   . High Cholesterol Mother   . Alcoholism Father     Social History Social History  Substance Use Topics  . Smoking status: Never Smoker  . Smokeless tobacco: Never Used  . Alcohol use  No     Allergies   Macrolides and ketolides; Aricept [donepezil hcl]; Codeine; Morphine; Penicillin g; Penicillins; Statins; Streptomycin; Tetracycline; and Tetracyclines & related   Review of Systems Review of Systems  Musculoskeletal: Positive for arthralgias and myalgias.  Skin: Positive for wound.  All other systems reviewed and are negative.    Physical Exam Updated Vital Signs BP (!) 156/68   Pulse 69   Temp 98.1 F (36.7 C) (Oral)   Resp 18   Wt 72.1 kg (159 lb)   SpO2 99%   BMI 27.29 kg/m   Physical Exam  Constitutional: She is oriented to person, place, and time. She appears well-developed and well-nourished. No distress.  HENT:  Head: Normocephalic and atraumatic.  Cardiovascular: Normal rate, regular rhythm, normal heart sounds and intact distal pulses.   No murmur heard. Pulmonary/Chest: Effort normal and breath sounds normal. No respiratory distress.  Abdominal: Soft. She exhibits no distension. There is no tenderness.  Musculoskeletal:  Tenderness to palpation along left lateral hip along with mid-to-left T/L spine. Full ROM. 5/5 muscle strength in all four extremities. SLR negative bilaterally.  Neurological: She is alert and oriented to person, place, and time.  Speech clear and goal oriented. CN 2-12 grossly intact. Normal finger-to-nose and rapid alternating movements. No drift. Strength and sensation intact. Steady gait.  Skin: Skin is warm and dry.  Skin tear to dorsum of right hand and to left lower extremity.  Nursing note and vitals reviewed.    ED Treatments / Results  Labs (all labs ordered are listed, but only abnormal results are displayed) Labs Reviewed  PROTIME-INR - Abnormal; Notable for the following:       Result Value   Prothrombin Time 23.3 (*)    All other components within normal limits    EKG  EKG Interpretation None       Radiology Dg Thoracic Spine 2 View  Result Date: 05/18/2017 CLINICAL DATA:  Patient reports  fall onto her face today, patient denies  hip pain but tenderness with palpated on the lateral side of left hip. Patient seemed to have lower and upper back pain while lying flat. EXAM: THORACIC SPINE 2 VIEWS COMPARISON:  None. FINDINGS: There is no evidence of thoracic spine fracture. Mild dextroscoliosis of the thoracic spine may be related to patient positioning. No evidence of acute vertebral body subluxation. Visualized paravertebral soft tissues are unremarkable. Atherosclerotic changes noted along the walls of the thoracic aorta. IMPRESSION: 1. No acute findings. No fracture or dislocation seen within the thoracic spine. 2. Mild scoliosis. 3. Aortic atherosclerosis. Electronically Signed   By: Franki Cabot M.D.   On: 05/18/2017 13:50   Dg Lumbar Spine Complete  Result Date: 05/18/2017 CLINICAL DATA:  Patient reports fall onto her face today, patient denies hip pain but tenderness with palpated on the lateral side of left hip. Patient seemed to have lower and upper back pain while lying flat. EXAM: LUMBAR SPINE - COMPLETE 4+ VIEW COMPARISON:  None. FINDINGS: Alignment is within normal limits. No fracture seen within the lumbar spine. Mild degenerative change noted in the lower lumbar spine. Aortic atherosclerosis. Paravertebral soft tissues otherwise unremarkable. IMPRESSION: 1. No acute findings. No fracture or acute subluxation within the lumbar spine. 2. Mild degenerative change in the lower lumbar spine Electronically Signed   By: Franki Cabot M.D.   On: 05/18/2017 13:51   Ct Head Wo Contrast  Result Date: 05/18/2017 CLINICAL DATA:  Fall this morning. History of benign tumor removed 5 years ago. EXAM: CT HEAD WITHOUT CONTRAST TECHNIQUE: Contiguous axial images were obtained from the base of the skull through the vertex without intravenous contrast. COMPARISON:  Head CT dated 02/08/2016.  Brain MRI dated 03/11/2013. FINDINGS: Brain: There is mild generalized age related parenchymal atrophy with  commensurate dilatation of the ventricles and sulci. Chronic small vessel ischemic changes again noted within the bilateral periventricular and subcortical white matter regions. There is a stable hyperdense, partially calcified, mass within the upper left cerebellum, with surrounding encephalomalacia. No new parenchymal mass or edema. No parenchymal or extra-axial hemorrhage. Vascular: There are chronic calcified atherosclerotic changes of the large vessels at the skull base. No unexpected hyperdense vessel. Skull: Old left occipital/skullbase craniectomy. No acute appearing osseous abnormality. No acute appearing skull fracture or displacement. Sinuses/Orbits: No acute finding. Other: None. IMPRESSION: 1. No acute findings. No intracranial hemorrhage or edema. No acute skull fracture. 2. Stable postoperative changes within the left cerebellum, with stable appearance of the overlying occipital bone craniectomy, with stable associated hyperdense mass (partially calcified) again most suggestive of meningioma. 3. Atrophy and chronic ischemic changes in the white matter. Electronically Signed   By: Franki Cabot M.D.   On: 05/18/2017 14:04   Dg Hip Unilat W Or Wo Pelvis 2-3 Views Left  Result Date: 05/18/2017 CLINICAL DATA:  Fall. Tenderness to palpation around the left hip. Initial encounter. EXAM: DG HIP (WITH OR WITHOUT PELVIS) 2-3V LEFT COMPARISON:  None. FINDINGS: No evidence of hip or pelvic ring fracture. There is a partly seen right hip arthroplasty with lateral heterotopic ossification that is non bridging. Mild heterotopic ossification seen medial to the left femoral neck. Spondylosis and osteopenia. IMPRESSION: No acute finding. Electronically Signed   By: Monte Fantasia M.D.   On: 05/18/2017 13:49    Procedures Procedures (including critical care time)  Medications Ordered in ED Medications - No data to display   Initial Impression / Assessment and Plan / ED Course  I have reviewed the triage  vital signs  and the nursing notes.  Pertinent labs & imaging results that were available during my care of the patient were reviewed by me and considered in my medical decision making (see chart for details).    Patricia [name] is a 83 y.o. female on coumadin who presents to ER for evaluation after mechanical fall. Multiple abrasions noted. Wounds thoroughly cleaned and dressed in ED. Tetanus is up-to-date. Tenderness to hips and back. X-ray negative. CT head negative. Patient ambulated with walker with no pain or difficulty. This is baseline ambulation for her. INR 2.03. Evaluation does not show pathology that would require ongoing emergent intervention or inpatient treatment. PCP follow up. Return precautions discussed. All questions answered.    Final Clinical Impressions(s) / ED Diagnoses   Final diagnoses:  Fall, initial encounter  Multiple abrasions    New Prescriptions Discharge Medication List as of 05/18/2017  4:33 PM       Ward, Ozella Almond, PA-C 05/19/17 1541    Pattricia Boss, MD 05/23/17 1421

## 2017-05-18 NOTE — ED Notes (Signed)
Steri strips applied to skin tear on lower right leg- education on wound care completed at same time.

## 2017-08-28 ENCOUNTER — Other Ambulatory Visit: Payer: Self-pay

## 2017-08-28 ENCOUNTER — Emergency Department (HOSPITAL_COMMUNITY)
Admission: EM | Admit: 2017-08-28 | Discharge: 2017-08-28 | Disposition: A | Discharge status: Home / Self Care | Payer: Medicare Other | Attending: Emergency Medicine | Admitting: Emergency Medicine

## 2017-08-28 ENCOUNTER — Encounter (HOSPITAL_COMMUNITY): Payer: Self-pay

## 2017-08-28 DIAGNOSIS — I5032 Chronic diastolic (congestive) heart failure: Secondary | ICD-10-CM | POA: Insufficient documentation

## 2017-08-28 DIAGNOSIS — R7989 Other specified abnormal findings of blood chemistry: Secondary | ICD-10-CM | POA: Diagnosis present

## 2017-08-28 DIAGNOSIS — I129 Hypertensive chronic kidney disease with stage 1 through stage 4 chronic kidney disease, or unspecified chronic kidney disease: Secondary | ICD-10-CM | POA: Insufficient documentation

## 2017-08-28 DIAGNOSIS — E039 Hypothyroidism, unspecified: Secondary | ICD-10-CM | POA: Diagnosis not present

## 2017-08-28 DIAGNOSIS — J449 Chronic obstructive pulmonary disease, unspecified: Secondary | ICD-10-CM | POA: Insufficient documentation

## 2017-08-28 DIAGNOSIS — E119 Type 2 diabetes mellitus without complications: Secondary | ICD-10-CM | POA: Insufficient documentation

## 2017-08-28 DIAGNOSIS — Z79890 Hormone replacement therapy (postmenopausal): Secondary | ICD-10-CM | POA: Insufficient documentation

## 2017-08-28 DIAGNOSIS — N183 Chronic kidney disease, stage 3 (moderate): Secondary | ICD-10-CM | POA: Diagnosis not present

## 2017-08-28 DIAGNOSIS — Z79891 Long term (current) use of opiate analgesic: Secondary | ICD-10-CM | POA: Insufficient documentation

## 2017-08-28 DIAGNOSIS — Z79899 Other long term (current) drug therapy: Secondary | ICD-10-CM | POA: Diagnosis not present

## 2017-08-28 DIAGNOSIS — Z7901 Long term (current) use of anticoagulants: Secondary | ICD-10-CM | POA: Diagnosis not present

## 2017-08-28 DIAGNOSIS — I2510 Atherosclerotic heart disease of native coronary artery without angina pectoris: Secondary | ICD-10-CM | POA: Insufficient documentation

## 2017-08-28 DIAGNOSIS — Z940 Kidney transplant status: Secondary | ICD-10-CM | POA: Diagnosis not present

## 2017-08-28 DIAGNOSIS — R791 Abnormal coagulation profile: Secondary | ICD-10-CM | POA: Insufficient documentation

## 2017-08-28 LAB — CBC WITH DIFFERENTIAL/PLATELET
Basophils Absolute: 0 10*3/uL (ref 0.0–0.1)
Basophils Relative: 0 %
EOS PCT: 1 %
Eosinophils Absolute: 0.1 10*3/uL (ref 0.0–0.7)
HCT: 38.3 % (ref 36.0–46.0)
Hemoglobin: 12.3 g/dL (ref 12.0–15.0)
LYMPHS ABS: 2.3 10*3/uL (ref 0.7–4.0)
LYMPHS PCT: 31 %
MCH: 29.7 pg (ref 26.0–34.0)
MCHC: 32.1 g/dL (ref 30.0–36.0)
MCV: 92.5 fL (ref 78.0–100.0)
Monocytes Absolute: 0.6 10*3/uL (ref 0.1–1.0)
Monocytes Relative: 9 %
Neutro Abs: 4.3 10*3/uL (ref 1.7–7.7)
Neutrophils Relative %: 59 %
PLATELETS: 246 10*3/uL (ref 150–400)
RBC: 4.14 MIL/uL (ref 3.87–5.11)
RDW: 15.5 % (ref 11.5–15.5)
WBC: 7.3 10*3/uL (ref 4.0–10.5)

## 2017-08-28 LAB — PROTIME-INR
INR: 6.6
Prothrombin Time: 57.3 seconds — ABNORMAL HIGH (ref 11.4–15.2)

## 2017-08-28 LAB — BASIC METABOLIC PANEL
Anion gap: 9 (ref 5–15)
BUN: 33 mg/dL — AB (ref 6–20)
CALCIUM: 9.5 mg/dL (ref 8.9–10.3)
CO2: 20 mmol/L — ABNORMAL LOW (ref 22–32)
CREATININE: 1.76 mg/dL — AB (ref 0.44–1.00)
Chloride: 108 mmol/L (ref 101–111)
GFR calc Af Amer: 30 mL/min — ABNORMAL LOW (ref >60)
GFR, EST NON AFRICAN AMERICAN: 26 mL/min — AB (ref >60)
GLUCOSE: 98 mg/dL (ref 65–99)
Potassium: 3.6 mmol/L (ref 3.5–5.1)
Sodium: 137 mmol/L (ref 135–145)

## 2017-08-28 MED ORDER — PHYTONADIONE 5 MG PO TABS
2.5000 mg | ORAL_TABLET | Freq: Once | ORAL | Status: AC
Start: 2017-08-28 — End: 2017-08-28
  Administered 2017-08-28: 2.5 mg via ORAL
  Filled 2017-08-28: qty 1

## 2017-08-28 NOTE — ED Provider Notes (Signed)
[facility] EMERGENCY DEPARTMENT Provider Note   CSN: [id number] Arrival date & time: 08/28/17  1816     History   Chief Complaint Chief Complaint  Patient presents with  . Abnormal Lab    HPI Patricia [name] is a 83 y.o. female.  HPI   Went for kidney transplant check up at [facility], and is on a lot of medication, and have had problem balancing coumadin with Dr. Felipa Eth, and have been changing doses, was 3 days with 3mg  then 1 day with 4mg , the back to 3.  Ate a green salad last night.  No black or bloody stools, no recent falls, no headache, no vomiting blood, no vaginal bleeding or blood in urine.   Past Medical History:  Diagnosis Date  . Abnormal gait   . Anxiety   . COPD, mild ([place])   . Coronary artery disease   . Depression   . Diabetes ([place])   . Diverticulitis   . Dizziness   . DVT (deep venous thrombosis) ([place])   . Hx of kidney transplant    right  . Hypertension   . Hypothyroidism   . Multiple allergies   . Reflux   . Renal disorder   . Thyroid disease     Patient Active Problem List   Diagnosis Date Noted  . Postoperative anemia due to acute blood loss 02/15/2016  . Hyperlipidemia 02/15/2016  . Displaced fracture of right femoral neck ([place]) 02/10/2016  . Chronic diastolic CHF (congestive heart failure) ([place]) 02/08/2016  . Closed right hip fracture ([place]) 02/08/2016  . Hip fracture ([place]) 02/08/2016  . Acute diastolic CHF (congestive heart failure) ([place]) 05/27/2015  . Urinary incontinence 05/27/2015  . Escherichia coli urinary tract infection   . Other emphysema ([place])   . Hypokalemia   . Hypomagnesemia   . Sepsis secondary to UTI ([place]) 05/22/2015  . DVT, lower extremity, proximal ([place]) 03/02/2014  . CKD (chronic kidney disease) stage 3, GFR 30-59 ml/min ([facility]) 03/02/2014  . Essential hypertension 03/02/2014  . DVT (deep venous thrombosis) ([place]) 03/01/2014  . Acute renal failure superimposed on stage 3 chronic kidney disease ([place])  03/01/2014  . Gastroenteritis 11/22/2013  . Nausea & vomiting 11/22/2013  . Hypercalcemia 11/22/2013  . Infection due to ESBL-producing Escherichia coli 10/07/2013  . Septicemia due to E. coli ([place]) 10/07/2013  . Urinary tract infection 10/05/2013  . Sepsis ([place]) 10/05/2013  . History of renal transplant   . Hx of kidney transplant   . Hypertension   . Thyroid disease   . Multiple allergies   . Abnormal gait   . Reflux   . Diverticulitis   . Hypothyroidism   . Depression   . Anxiety   . COPD (chronic obstructive pulmonary disease) ([place])   . Dizziness   . Diabetes type 2, controlled [facility])     Past Surgical History:  Procedure Laterality Date  . ANTERIOR APPROACH HEMI HIP ARTHROPLASTY Right 02/10/2016   Procedure: TOTAL HIP ARTHROPLASTY ANTERIOR APPROACH;  Surgeon: Rod Can, MD;  Location: [place];  Service: Orthopedics;  Laterality: Right;  . NEPHRECTOMY TRANSPLANTED ORGAN    . TUMOR REMOVAL     Brain     OB History    No data available       Home Medications    Prior to Admission medications   Medication Sig Start Date End Date Taking? Authorizing Provider  albuterol (PROVENTIL HFA;VENTOLIN HFA) 108 (90 BASE) MCG/ACT inhaler Inhale 2 puffs into the lungs every  6 (six) hours as needed for wheezing or shortness of breath.     [provider]  azaTHIOprine (IMURAN) 50 MG tablet Take 50 mg by mouth 2 (two) times daily.     [provider]  b complex vitamins tablet Take 1 tablet by mouth daily.    [provider]  BIOTIN PO Take 1 tablet by mouth every evening.    [provider]  budesonide-formoterol (SYMBICORT) 160-4.5 MCG/ACT inhaler Inhale 1 puff into the lungs daily as needed (shortness of breath or wheezing).     [provider]  cetirizine (ZYRTEC) 10 MG tablet Take 10 mg by mouth daily. Reported on 02/08/2016    [provider]  Cholecalciferol (VITAMIN D) 2000 UNITS tablet Take 2,000 Units by mouth every  evening.     [provider]  cloNIDine (CATAPRES) 0.1 MG tablet Take 0.1 mg by mouth 2 (two) times daily.    [provider]  Coenzyme Q10 (COQ10) 200 MG CAPS Take 200 mg by mouth daily.     [provider]  colesevelam (WELCHOL) 625 MG tablet Take 1,875 mg by mouth 2 (two) times daily with a meal.    [provider]  cycloSPORINE modified (NEORAL) 100 MG capsule Take 100 mg by mouth 2 (two) times daily.     [provider]  fenofibrate (TRICOR) 48 MG tablet Take 48 mg by mouth every evening.     [provider]  FLUoxetine (PROZAC) 20 MG tablet Take 20 mg by mouth daily.     [provider]  fluticasone (FLONASE) 50 MCG/ACT nasal spray Place 1 spray into both nostrils daily as needed for allergies or rhinitis.    [provider]  furosemide (LASIX) 40 MG tablet Give 2 tablets(80 mg)  in the morning daily then also give 1 tablet in the evening.    [provider]  Glucosamine-Chondroitin (MOVE FREE PO) Take 1 tablet by mouth every evening.    [provider]  HYDROcodone-acetaminophen (NORCO/VICODIN) 5-325 MG tablet Take 1 tablet by mouth every 6 (six) hours as needed for moderate pain.     [provider]  HYDROcodone-acetaminophen (NORCO/VICODIN) 5-325 MG tablet Take 1-2 tablets by mouth every 6 (six) hours as needed. 02/21/17   Lacretia Leigh, MD  L-Lysine 500 MG TABS Take 500 mg by mouth daily.    [provider]  levothyroxine (SYNTHROID, LEVOTHROID) 75 MCG tablet Take 75 mcg by mouth daily.    [provider]  Lutein-Zeaxanthin 25-5 MG CAPS Take 1 capsule by mouth daily.    [provider]  metoprolol succinate (TOPROL-XL) 100 MG 24 hr tablet Take 100 mg by mouth every evening. Reported on 02/10/2016    [provider]  mirabegron ER (MYRBETRIQ) 25 MG TB24 tablet Take 25 mg by mouth every evening.     [provider]  Multiple Vitamin (MULTIVITAMIN WITH  MINERALS) TABS tablet Take 1 tablet by mouth every evening.    [provider]  Omega-3 Fatty Acids (FISH OIL) 1000 MG CAPS Take 1,000 mg by mouth every evening.    [provider]  omeprazole (PRILOSEC) 20 MG capsule Take 20 mg by mouth every evening.     [provider]  oxybutynin (DITROPAN-XL) 10 MG 24 hr tablet Take 10 mg by mouth daily.     [provider]  POTASSIUM GLUCONATE PO Take 550 mg by mouth every evening.     [provider]  predniSONE (DELTASONE) 5  MG tablet Take 5 mg by mouth daily with breakfast.    [provider]  Probiotic Product (ALIGN) 4 MG CAPS Take 4 mg by mouth every evening.     [provider]  promethazine (PHENERGAN) 25 MG tablet Take 25 mg by mouth every 6 (six) hours as needed for nausea or vomiting.    [provider]  Resveratrol 100 MG CAPS Take 100 mg by mouth every evening.     [provider]  sulfamethoxazole-trimethoprim (BACTRIM,SEPTRA) 400-80 MG per tablet Take 1 tablet by mouth 3 (three) times a week. Monday, Wednesday, and Friday mornings    [provider]  warfarin (COUMADIN) 2.5 MG tablet Take 2.5 mg by mouth daily.    [provider]  Zinc 50 MG TABS Take 50 mg by mouth every evening.    [provider]    Family History Family History  Problem Relation Age of Onset  . Alzheimer's disease Mother   . High Cholesterol Mother   . Alcoholism Father     Social History Social History   Tobacco Use  . Smoking status: Never Smoker  . Smokeless tobacco: Never Used  Substance Use Topics  . Alcohol use: No  . Drug use: No     Allergies   Macrolides and ketolides; Aricept [donepezil hcl]; Codeine; Morphine; Penicillin g; Penicillins; Statins; Streptomycin; Tetracycline; and Tetracyclines & related   Review of Systems Review of Systems  Constitutional: Negative for fever.  HENT: Negative for sore throat.   Eyes: Negative for visual  disturbance.  Respiratory: Negative for cough and shortness of breath.   Cardiovascular: Negative for chest pain.  Gastrointestinal: Negative for abdominal pain, nausea and vomiting.  Genitourinary: Negative for difficulty urinating.  Musculoskeletal: Negative for back pain and neck pain.  Skin: Negative for rash.  Neurological: Negative for syncope and headaches.     Physical Exam Updated Vital Signs BP (!) 147/89   Pulse (!) 54   Temp 98.3 F (36.8 C) (Oral)   Resp 16   SpO2 97%   Physical Exam  Constitutional: She is oriented to person, place, and time. She appears well-developed and well-nourished. No distress.  HENT:  Head: Normocephalic and atraumatic.  Eyes: Conjunctivae and EOM are normal.  Neck: Normal range of motion.  Cardiovascular: Normal rate, regular rhythm, normal heart sounds and intact distal pulses. Exam reveals no gallop and no friction rub.  No murmur heard. Pulmonary/Chest: Effort normal and breath sounds normal. No respiratory distress. She has no wheezes. She has no rales.  Abdominal: Soft. She exhibits no distension. There is no tenderness. There is no guarding.  Musculoskeletal: She exhibits no edema or tenderness.  Neurological: She is alert and oriented to person, place, and time.  Skin: Skin is warm and dry. No rash noted. She is not diaphoretic. No erythema.  Nursing note and vitals reviewed.    ED Treatments / Results  Labs (all labs ordered are listed, but only abnormal results are displayed) Labs Reviewed  BASIC METABOLIC PANEL - Abnormal; Notable for the following components:      Result Value   CO2 20 (*)    BUN 33 (*)    Creatinine, Ser 1.76 (*)    GFR calc non Af Amer 26 (*)    GFR calc Af Amer 30 (*)    All other components within normal limits  PROTIME-INR - Abnormal; Notable for the following components:   Prothrombin Time 57.3 (*)    INR 6.60 (*)  All other components within normal limits  CBC WITH DIFFERENTIAL/PLATELET      EKG  EKG Interpretation None       Radiology No results found.  Procedures Procedures (including critical care time)  Medications Ordered in ED Medications  phytonadione (VITAMIN K) tablet 2.5 mg (2.5 mg Oral Given 08/28/17 2353)     Initial Impression / Assessment and Plan / ED Course  I have reviewed the triage vital signs and the nursing notes.  Pertinent labs & imaging results that were available during my care of the patient were reviewed by me and considered in my medical decision making (see chart for details).     83yo female with history of renal transplant, CHF, DVT, DM, COPD, htn, CAD, on coumadin, presents after nephrologist found her to have elevated INR on labwork today.  INR 6.6 on check here. Patient without concerns and has no history of bleeding or trauma.  Given 2.5mg  vitamin K, recommend holding coumadin for 2 days and discussing with her physician Dr. Felipa Eth tomorrow. Patient discharged in stable condition with understanding of reasons to return.   Final Clinical Impressions(s) / ED Diagnoses   Final diagnoses:  Supratherapeutic INR    ED Discharge Orders    None       Gareth Morgan, MD 08/29/17 1136

## 2017-08-28 NOTE — ED Triage Notes (Signed)
Pt was seen at [facility] this morning for routine labs and received a phone call tonight and told to come to ED d/t INR 6.85. Pt reports taking coumadin and having difficult time managing levels in the past. NAD. VSS. Pt has no complaints at this time

## 2017-08-28 NOTE — Discharge Instructions (Signed)
HOLD YOUR DOSE OF COUMADIN TOMORROW AND THE NEXT DAY and discuss with Dr. Felipa Eth.  Return for any bleeding symptoms or other concerns.

## 2017-08-28 NOTE — ED Notes (Signed)
Date and time results received: 08/28/17 (use smartphrase ".now" to insert current time)  Test: INR Critical Value: 6.60

## 2018-05-25 ENCOUNTER — Other Ambulatory Visit: Payer: Self-pay

## 2018-05-25 ENCOUNTER — Emergency Department (HOSPITAL_COMMUNITY): Payer: Medicare Other

## 2018-05-25 ENCOUNTER — Emergency Department (HOSPITAL_COMMUNITY)
Admission: EM | Admit: 2018-05-25 | Discharge: 2018-05-26 | Disposition: A | Discharge status: Home / Self Care | Payer: Medicare Other | Attending: Emergency Medicine | Admitting: Emergency Medicine

## 2018-05-25 ENCOUNTER — Encounter (HOSPITAL_COMMUNITY): Payer: Self-pay | Admitting: Emergency Medicine

## 2018-05-25 DIAGNOSIS — Z7901 Long term (current) use of anticoagulants: Secondary | ICD-10-CM | POA: Insufficient documentation

## 2018-05-25 DIAGNOSIS — J449 Chronic obstructive pulmonary disease, unspecified: Secondary | ICD-10-CM | POA: Diagnosis not present

## 2018-05-25 DIAGNOSIS — R109 Unspecified abdominal pain: Secondary | ICD-10-CM | POA: Diagnosis present

## 2018-05-25 DIAGNOSIS — I130 Hypertensive heart and chronic kidney disease with heart failure and stage 1 through stage 4 chronic kidney disease, or unspecified chronic kidney disease: Secondary | ICD-10-CM | POA: Insufficient documentation

## 2018-05-25 DIAGNOSIS — I5032 Chronic diastolic (congestive) heart failure: Secondary | ICD-10-CM | POA: Diagnosis not present

## 2018-05-25 DIAGNOSIS — K8020 Calculus of gallbladder without cholecystitis without obstruction: Secondary | ICD-10-CM | POA: Diagnosis not present

## 2018-05-25 DIAGNOSIS — Z96641 Presence of right artificial hip joint: Secondary | ICD-10-CM | POA: Insufficient documentation

## 2018-05-25 DIAGNOSIS — Z79899 Other long term (current) drug therapy: Secondary | ICD-10-CM | POA: Insufficient documentation

## 2018-05-25 DIAGNOSIS — E039 Hypothyroidism, unspecified: Secondary | ICD-10-CM | POA: Diagnosis not present

## 2018-05-25 DIAGNOSIS — N183 Chronic kidney disease, stage 3 (moderate): Secondary | ICD-10-CM | POA: Insufficient documentation

## 2018-05-25 DIAGNOSIS — I160 Hypertensive urgency: Secondary | ICD-10-CM | POA: Diagnosis not present

## 2018-05-25 DIAGNOSIS — R112 Nausea with vomiting, unspecified: Secondary | ICD-10-CM | POA: Insufficient documentation

## 2018-05-25 DIAGNOSIS — E1122 Type 2 diabetes mellitus with diabetic chronic kidney disease: Secondary | ICD-10-CM | POA: Insufficient documentation

## 2018-05-25 DIAGNOSIS — R1032 Left lower quadrant pain: Secondary | ICD-10-CM | POA: Diagnosis not present

## 2018-05-25 LAB — URINALYSIS, ROUTINE W REFLEX MICROSCOPIC
Bilirubin Urine: NEGATIVE
GLUCOSE, UA: NEGATIVE mg/dL
KETONES UR: NEGATIVE mg/dL
NITRITE: NEGATIVE
Protein, ur: 30 mg/dL — AB
Specific Gravity, Urine: 1.008 (ref 1.005–1.030)
pH: 7 (ref 5.0–8.0)

## 2018-05-25 LAB — CBC
HCT: 39.8 % (ref 36.0–46.0)
HEMOGLOBIN: 12.6 g/dL (ref 12.0–15.0)
MCH: 31.6 pg (ref 26.0–34.0)
MCHC: 31.7 g/dL (ref 30.0–36.0)
MCV: 99.7 fL (ref 78.0–100.0)
Platelets: 230 10*3/uL (ref 150–400)
RBC: 3.99 MIL/uL (ref 3.87–5.11)
RDW: 14.3 % (ref 11.5–15.5)
WBC: 6.8 10*3/uL (ref 4.0–10.5)

## 2018-05-25 LAB — COMPREHENSIVE METABOLIC PANEL
ALBUMIN: 3.8 g/dL (ref 3.5–5.0)
ALK PHOS: 62 U/L (ref 38–126)
ALT: 34 U/L (ref 0–44)
AST: 47 U/L — AB (ref 15–41)
Anion gap: 14 (ref 5–15)
BILIRUBIN TOTAL: 0.9 mg/dL (ref 0.3–1.2)
BUN: 31 mg/dL — AB (ref 8–23)
CALCIUM: 10.6 mg/dL — AB (ref 8.9–10.3)
CO2: 23 mmol/L (ref 22–32)
CREATININE: 1.94 mg/dL — AB (ref 0.44–1.00)
Chloride: 105 mmol/L (ref 98–111)
GFR calc Af Amer: 26 mL/min — ABNORMAL LOW (ref >60)
GFR calc non Af Amer: 23 mL/min — ABNORMAL LOW (ref >60)
GLUCOSE: 144 mg/dL — AB (ref 70–99)
Potassium: 3.6 mmol/L (ref 3.5–5.1)
Sodium: 142 mmol/L (ref 135–145)
Total Protein: 7.7 g/dL (ref 6.5–8.1)

## 2018-05-25 LAB — LIPASE, BLOOD: Lipase: 46 U/L (ref 11–51)

## 2018-05-25 MED ORDER — IOPAMIDOL (ISOVUE-370) INJECTION 76%
INTRAVENOUS | Status: AC
  Filled 2018-05-25: qty 50

## 2018-05-25 MED ORDER — MORPHINE SULFATE (PF) 4 MG/ML IV SOLN
4.0000 mg | Freq: Once | INTRAVENOUS | Status: AC
  Administered 2018-05-25: 4 mg via INTRAVENOUS
  Filled 2018-05-25: qty 1

## 2018-05-25 MED ORDER — IOPAMIDOL (ISOVUE-300) INJECTION 61%
INTRAVENOUS | Status: AC
  Filled 2018-05-25: qty 30

## 2018-05-25 MED ORDER — ONDANSETRON HCL 4 MG/2ML IJ SOLN
4.0000 mg | Freq: Once | INTRAMUSCULAR | Status: AC
  Administered 2018-05-25: 4 mg via INTRAVENOUS
  Filled 2018-05-25: qty 2

## 2018-05-25 MED ORDER — ONDANSETRON 4 MG PO TBDP
4.0000 mg | ORAL_TABLET | Freq: Once | ORAL | Status: AC | PRN
  Administered 2018-05-25: 4 mg via ORAL
  Filled 2018-05-25: qty 1

## 2018-05-25 MED ORDER — LABETALOL HCL 5 MG/ML IV SOLN
20.0000 mg | Freq: Once | INTRAVENOUS | Status: DC
  Filled 2018-05-25: qty 4

## 2018-05-25 NOTE — ED Triage Notes (Signed)
Pt reports LLQ pain starting about 3 hours ago. Pt reports nausea/vomiting starting today. Pt reports she thought it was gas but the pain has worsened.

## 2018-05-25 NOTE — ED Provider Notes (Signed)
[facility] EMERGENCY DEPARTMENT Provider Note   CSN: [id number] Arrival date & time: 05/25/18  1730     History   Chief Complaint Chief Complaint  Patient presents with  . Abdominal Pain  . Emesis    HPI Patricia [name] is a 84 y.o. female.   The history is provided by the patient, the spouse and medical records.  No language interpreter was used.   Abdominal Pain    Associated symptoms include vomiting.   Emesis    Associated symptoms include abdominal pain.     84 year old female with history of gastric reflux, COPD, CAD, diabetes, DVT, hypertension, thyroid disease who presented to the ED today for evaluation of abdominal pain.  Patient reports she was eating a tuna's salad this afternoon and shortly after she felt nauseous, vomited with food follows with having abdominal pain.  Pain is to her left lower abdomen and radiated across her abdomen, crampy, sharp, persistent and moderate in severity.  She did had a normal bowel movement this morning.  She is able to pass flatus.  She denies having any fever chills headache, chest pain shortness of breath or productive cough.  She denies any burning on urination.  Denies any recent sickness.  She felt fine yesterday.  No specific treatment tried at home.  Past Medical History:  Diagnosis Date  . Abnormal gait   . Anxiety   . COPD, mild ([place])   . Coronary artery disease   . Depression   . Diabetes ([place])   . Diverticulitis   . Dizziness   . DVT (deep venous thrombosis) ([place])   . Hx of kidney transplant    right  . Hypertension   . Hypothyroidism   . Multiple allergies   . Reflux   . Renal disorder   . Thyroid disease     Patient Active Problem List   Diagnosis Date Noted  . Postoperative anemia due to acute blood loss 02/15/2016  . Hyperlipidemia 02/15/2016  . Displaced fracture of right femoral neck ([place]) 02/10/2016  . Chronic diastolic CHF (congestive heart failure) ([place]) 02/08/2016  . Closed  right hip fracture ([place]) 02/08/2016  . Hip fracture ([place]) 02/08/2016  . Acute diastolic CHF (congestive heart failure) ([place]) 05/27/2015  . Urinary incontinence 05/27/2015  . Escherichia coli urinary tract infection   . Other emphysema ([place])   . Hypokalemia   . Hypomagnesemia   . Sepsis secondary to UTI ([place]) 05/22/2015  . DVT, lower extremity, proximal ([place]) 03/02/2014  . CKD (chronic kidney disease) stage 3, GFR 30-59 ml/min (H[facility]) 03/02/2014  . Essential hypertension 03/02/2014  . DVT (deep venous thrombosis) ([place]) 03/01/2014  . Acute renal failure superimposed on stage 3 chronic kidney disease ([place]) 03/01/2014  . Gastroenteritis 11/22/2013  . Nausea & vomiting 11/22/2013  . Hypercalcemia 11/22/2013  . Infection due to ESBL-producing Escherichia coli 10/07/2013  . Septicemia due to E. coli ([place]) 10/07/2013  . Urinary tract infection 10/05/2013  . Sepsis ([place]) 10/05/2013  . History of renal transplant   . Hx of kidney transplant   . Hypertension   . Thyroid disease   . Multiple allergies   . Abnormal gait   . Reflux   . Diverticulitis   . Hypothyroidism   . Depression   . Anxiety   . COPD (chronic obstructive pulmonary disease) ([place])   . Dizziness   . Diabetes type 2, controlled [facility])     Past Surgical History:  Procedure Laterality Date  . ANTERIOR  APPROACH HEMI HIP ARTHROPLASTY Right 02/10/2016   Procedure: TOTAL HIP ARTHROPLASTY ANTERIOR APPROACH;  Surgeon: Rod Can, MD;  Location: [facility];  Service: Orthopedics;  Laterality: Right;  . NEPHRECTOMY TRANSPLANTED ORGAN    . TUMOR REMOVAL     Brain      OB History   None      Home Medications    Prior to Admission medications   Medication Sig Start Date End Date Taking? Authorizing Provider  albuterol (PROVENTIL HFA;VENTOLIN HFA) 108 (90 BASE) MCG/ACT inhaler Inhale 2 puffs into the lungs every 6 (six) hours as needed for wheezing or shortness of breath.     [provider]  azaTHIOprine (IMURAN) 50  MG tablet Take 50 mg by mouth 2 (two) times daily.     [provider]  b complex vitamins tablet Take 1 tablet by mouth daily.    [provider]  BIOTIN PO Take 1 tablet by mouth every evening.    [provider]  budesonide-formoterol (SYMBICORT) 160-4.5 MCG/ACT inhaler Inhale 1 puff into the lungs daily as needed (shortness of breath or wheezing).     [provider]  cetirizine (ZYRTEC) 10 MG tablet Take 10 mg by mouth daily. Reported on 02/08/2016    [provider]  Cholecalciferol (VITAMIN D) 2000 UNITS tablet Take 2,000 Units by mouth every evening.     [provider]  cloNIDine (CATAPRES) 0.1 MG tablet Take 0.1 mg by mouth 2 (two) times daily.    [provider]  Coenzyme Q10 (COQ10) 200 MG CAPS Take 200 mg by mouth daily.     [provider]  colesevelam (WELCHOL) 625 MG tablet Take 1,875 mg by mouth 2 (two) times daily with a meal.    [provider]  cycloSPORINE modified (NEORAL) 100 MG capsule Take 100 mg by mouth 2 (two) times daily.     [provider]  fenofibrate (TRICOR) 48 MG tablet Take 48 mg by mouth every evening.     [provider]  FLUoxetine (PROZAC) 20 MG tablet Take 20 mg by mouth daily.     [provider]  fluticasone (FLONASE) 50 MCG/ACT nasal spray Place 1 spray into both nostrils daily as needed for allergies or rhinitis.    [provider]  furosemide (LASIX) 40 MG tablet Give 2 tablets(80 mg)  in the morning daily then also give 1 tablet in the evening.    [provider]  Glucosamine-Chondroitin (MOVE FREE PO) Take 1 tablet by mouth every evening.    [provider]  HYDROcodone-acetaminophen (NORCO/VICODIN) 5-325 MG tablet Take 1 tablet by mouth every 6 (six) hours as needed for moderate pain.     [provider]  HYDROcodone-acetaminophen (NORCO/VICODIN) 5-325 MG tablet Take 1-2 tablets by mouth every 6 (six) hours as  needed. 02/21/17   Lacretia Leigh, MD  L-Lysine 500 MG TABS Take 500 mg by mouth daily.    [provider]  levothyroxine (SYNTHROID, LEVOTHROID) 75 MCG tablet Take 75 mcg by mouth daily.    [provider]  Lutein-Zeaxanthin 25-5 MG CAPS Take 1 capsule by mouth daily.    [provider]  metoprolol succinate (TOPROL-XL) 100 MG 24 hr tablet Take 100 mg by mouth every evening. Reported on 02/10/2016    [provider]  mirabegron ER (MYRBETRIQ) 25 MG TB24 tablet Take 25 mg by mouth every evening.     [provider]  Multiple Vitamin (MULTIVITAMIN WITH MINERALS) TABS tablet Take  1 tablet by mouth every evening.    [provider]  Omega-3 Fatty Acids (FISH OIL) 1000 MG CAPS Take 1,000 mg by mouth every evening.    [provider]  omeprazole (PRILOSEC) 20 MG capsule Take 20 mg by mouth every evening.     [provider]  oxybutynin (DITROPAN-XL) 10 MG 24 hr tablet Take 10 mg by mouth daily.     [provider]  POTASSIUM GLUCONATE PO Take 550 mg by mouth every evening.     [provider]  predniSONE (DELTASONE) 5 MG tablet Take 5 mg by mouth daily with breakfast.    [provider]  Probiotic Product (ALIGN) 4 MG CAPS Take 4 mg by mouth every evening.     [provider]  promethazine (PHENERGAN) 25 MG tablet Take 25 mg by mouth every 6 (six) hours as needed for nausea or vomiting.    [provider]  Resveratrol 100 MG CAPS Take 100 mg by mouth every evening.     [provider]  sulfamethoxazole-trimethoprim (BACTRIM,SEPTRA) 400-80 MG per tablet Take 1 tablet by mouth 3 (three) times a week. Monday, Wednesday, and Friday mornings    [provider]  warfarin (COUMADIN) 2.5 MG tablet Take 2.5 mg by mouth daily.    [provider]  Zinc 50 MG TABS Take 50 mg by mouth every evening.    [provider]    Family History Family History  Problem  Relation Age of Onset  . Alzheimer's disease Mother   . High Cholesterol Mother   . Alcoholism Father     Social History Social History   Tobacco Use  . Smoking status: Never Smoker  . Smokeless tobacco: Never Used  Substance Use Topics  . Alcohol use: No  . Drug use: No     Allergies   Macrolides and ketolides; Aricept [donepezil hcl]; Codeine; Morphine; Penicillin g; Penicillins; Statins; Streptomycin; Tetracycline; and Tetracyclines & related   Review of Systems Review of Systems  Gastrointestinal: Positive for abdominal pain and vomiting.  All other systems reviewed and are negative.    Physical Exam Updated Vital Signs BP (!) 207/72 (BP Location: Right Arm)   Pulse (!) 57   Temp 98.5 F (36.9 C) (Oral)   Resp 16   Ht 5\' 5"  (1.651 m)   Wt 65.8 kg   SpO2 99%   BMI 24.13 kg/m    Physical Exam  Constitutional: She appears well-developed and well-nourished. No distress.  Elderly female laying in bed in no acute discomfort.  HENT:  Head: Atraumatic.  Eyes: Conjunctivae are normal.  Neck: Neck supple.  Cardiovascular: Normal rate and regular rhythm.  Pulmonary/Chest: Effort normal and breath sounds normal.  Abdominal: Normal appearance. There is tenderness in the left lower quadrant. There is no rigidity, no rebound, no guarding, no tenderness at McBurney's point and negative Murphy's sign.  Neurological: She is alert.  Skin: No rash noted.  Psychiatric: She has a normal mood and affect.  Nursing note and vitals reviewed.    ED Treatments / Results  Labs (all labs ordered are listed, but only abnormal results are displayed) Labs Reviewed  COMPREHENSIVE METABOLIC PANEL - Abnormal; Notable for the following components:      Result Value   Glucose, Bld 144 (*)    BUN 31 (*)    Creatinine, Ser 1.94 (*)    Calcium 10.6 (*)    AST 47 (*)    GFR calc non Af Wyvonnia Lora  23 (*)    GFR calc Af Amer 26 (*)    All other components within normal limits  URINALYSIS,  ROUTINE W REFLEX MICROSCOPIC - Abnormal; Notable for the following components:   APPearance CLOUDY (*)    Hgb urine dipstick SMALL (*)    Protein, ur 30 (*)    Leukocytes, UA TRACE (*)    Bacteria, UA RARE (*)    All other components within normal limits  LIPASE, BLOOD  CBC    EKG None  Radiology Ct Abdomen Pelvis Wo Contrast  Result Date: 05/25/2018 CLINICAL DATA:  Left lower quadrant pain starting about 3 hours ago. Nausea and vomiting starting today. Pain worsening. EXAM: CT ABDOMEN AND PELVIS WITHOUT CONTRAST TECHNIQUE: Multidetector CT imaging of the abdomen and pelvis was performed following the standard protocol without IV contrast. COMPARISON:  03/12/2018 FINDINGS: Lower chest: Dependent atelectasis in the lung bases. Small esophageal hiatal hernia. Residual contrast material in the lower esophagus may be due to reflux or dysmotility. Calcification in the mitral valve annulus. Mild cardiac enlargement. Hepatobiliary: Lack of IV contrast material limits evaluation of the liver but no specific focal lesions are identified. Multiple stones in the gallbladder. Suggestion of mild pericholecystic infiltration. This could indicate cholecystitis. No bile duct dilatation. Pancreas: Unremarkable. No pancreatic ductal dilatation or surrounding inflammatory changes. Spleen: Calcified granulomas in the spleen. Spleen size is normal. Vague focal low-attenuation lesion in the dome of the spleen measuring about 14 mm diameter. Similar appearance to previous study. Likely hemangioma. Adrenals/Urinary Tract: No adrenal gland nodules. Bilateral renal parenchymal atrophy. No hydronephrosis or hydroureter. No renal, ureteral, or bladder stones identified. No bladder wall thickening. Pelvic transplant kidney on the right without hydronephrosis or stone identified. Calcification demonstrated in the transplant renal artery. Stomach/Bowel: Stomach, small bowel, and colon are not abnormally distended and no wall  thickening is appreciated. Contrast material flows through the small bowel to the colon without evidence of obstruction. A few scattered diverticula are present in the sigmoid colon without evidence of diverticulitis. Scattered stool throughout the colon. Appendix is normal. Vascular/Lymphatic: Aortic atherosclerosis. No enlarged abdominal or pelvic lymph nodes. Reproductive: Uterus and bilateral adnexa are unremarkable. Other: No free air or free fluid in the abdomen. Abdominal wall musculature appears intact although there is asymmetric atrophy of the right rectus abdominus muscles. Prominent collateral vascularity, likely venous varices present in the anterior abdominal wall soft tissues. Musculoskeletal: Degenerative changes in the spine. No destructive bone lesions. Right total hip arthroplasty. IMPRESSION: 1. Cholelithiasis with suggestion of mild pericholecystic infiltration. This could indicate mild acute cholecystitis. 2. Pelvic transplant kidney on the right without hydronephrosis or stone. Bilateral native renal atrophy. 3. Small esophageal hiatal hernia. Residual contrast material in the lower esophagus may be due to reflux or dysmotility. 4. Prominent collateral vascularity in the anterior abdominal wall consistent with venous varices. 5. Aortic atherosclerosis. 6. No evidence of bowel obstruction or inflammation. Diverticulosis of the sigmoid colon without evidence of diverticulitis. Electronically Signed   By: Lucienne Capers M.D.   On: 05/25/2018 22:39   US Abdomen Limited  Result Date: 05/26/2018 CLINICAL DATA:  Right upper quadrant pain EXAM: ULTRASOUND ABDOMEN LIMITED RIGHT UPPER QUADRANT COMPARISON:  CT from the previous day FINDINGS: Gallbladder: Gallbladder is again well distended with multiple gallstones within. No wall thickening or pericholecystic fluid is noted. Negative sonographic Percell Miller sign is seen. Some changes of adenomyomatosis are noted as well. Common bile duct: Diameter: 8.5  mm. Liver: No focal lesion identified. Within normal limits  in parenchymal echogenicity. Mild intrahepatic ductal dilatation is seen. Portal vein is patent on color Doppler imaging with normal direction of blood flow towards the liver. IMPRESSION: Cholelithiasis without changes of cholecystitis. Dilatation of the common bile duct which is at the upper limits of normal for the patient's age although some intrahepatic ductal dilatation is noted as well. Correlation with laboratory values is recommended. MRCP may be helpful for further evaluation. Changes of adenomyomatosis. Electronically Signed   By: Inez Catalina M.D.   On: 05/26/2018 00:32    Procedures Procedures (including critical care time)  Medications Ordered in ED Medications  iopamidol (ISOVUE-300) 61 % injection (has no administration in time range)  labetalol (NORMODYNE,TRANDATE) injection 20 mg (has no administration in time range)  ondansetron (ZOFRAN-ODT) disintegrating tablet 4 mg (4 mg Oral Given 05/25/18 1743)  morphine 4 MG/ML injection 4 mg (4 mg Intravenous Given 05/25/18 1945)  ondansetron (ZOFRAN) injection 4 mg (4 mg Intravenous Given 05/25/18 1942)     Initial Impression / Assessment and Plan / ED Course  I have reviewed the triage vital signs and the nursing notes.  Pertinent labs & imaging results that were available during my care of the patient were reviewed by me and considered in my medical decision making (see chart for details).     BP (!) 184/69   Pulse 67   Temp 98.5 F (36.9 C) (Oral)   Resp 18   Ht 5\' 5"  (1.651 m)   Wt 65.8 kg   SpO2 97%   BMI 24.13 kg/m    Final Clinical Impressions(s) / ED Diagnoses   Final diagnoses:  Calculus of gallbladder without cholecystitis without obstruction  Hypertensive urgency    ED Discharge Orders         Ordered    ondansetron (ZOFRAN) 4 MG tablet  Every 8 hours PRN     05/26/18 0041         7:15 PM Elderly female presenting complaining of lower  abdominal pain with associated nausea and vomiting.  She does have reproducible tenderness to her left lower quadrant.  She would benefit from an Abdominal pelvis CT scan for further evaluation.  Lab is remarkable for evidence of AKI with a BUN of 31, creatinine of 1.94.  Patient will need a noncontrast abdominal and pelvic CT scan given her impaired renal function.  She has normal WBC, normal lipase.  Urinalysis without evidence of UTI.  Patient appears to be very hypertensive with a blood pressure of 207/72.  No chest pain shortness of breath or headache.  Suspect pain causing elevate the blood pressure.  Will recheck.  11:12 PM Blood pressure still elevated after receiving pain medication.  We will give labetalol 20 mg IV.  Abdominal pelvis CT scan show evidence of cholelithiasis with suggestion of mild pericholecystic infiltration which could indicate mild acute cholecystitis.  Given this finding, will obtain limited abdominal ultrasound for further evaluation.  12:38 AM Abdominal ultrasound demonstrate evidence of cholelithiasis without changes of cholecystitis.  Mild dilatation of the common bile duct which is upper limit.  I discussed this finding with patient.  I encourage patient to follow-up with a gastroenterologist for a possible MRCP if indicated.  She may also discuss with a general surgery to have her gallbladder removed if indicated.  Also encouraged patient to follow-up with PCP next week for further management of her elevated blood pressure.  Return precautions discussed.  At this time patient is resting comfortably, tolerates p.o. and feel comfortable going  home with antinausea medication.  Domenic Moras, PA-C 05/26/18 0047  Maudie Flakes, MD 05/26/18 [id number]

## 2018-05-25 NOTE — ED Notes (Signed)
ED Provider at bedside. 

## 2018-05-26 MED ORDER — ONDANSETRON HCL 4 MG PO TABS: 4.0000 mg | ORAL_TABLET | Freq: Three times a day (TID) | ORAL | 0 refills | Status: DC | PRN

## 2018-05-26 NOTE — ED Notes (Signed)
Patient transported to Ultrasound 

## 2018-05-26 NOTE — Discharge Instructions (Addendum)
You have evidence of gallstone in your gallbladder.  This may cause your current pain. You may need an MRCP (a procedure that you should discuss with a gastroenterologist), and you may need to see a surgeon.  Please call and follow up closely with your primary care provider in the next few days for further evaluation and to recheck your blood pressure.  Return promptly if your condition worsen or if you have other concerns.

## 2018-05-26 NOTE — ED Notes (Signed)
Patient transported to CT 

## 2018-05-26 NOTE — ED Notes (Signed)
Pt departed in NAD.  

## 2018-05-29 ENCOUNTER — Other Ambulatory Visit: Payer: Self-pay | Admitting: Gastroenterology

## 2018-05-29 DIAGNOSIS — K838 Other specified diseases of biliary tract: Secondary | ICD-10-CM

## 2018-06-13 ENCOUNTER — Ambulatory Visit
Admission: RE | Admit: 2018-06-13 | Discharge: 2018-06-13 | Disposition: A | Discharge status: Home / Self Care | Payer: Medicare Other | Source: Ambulatory Visit | Attending: Gastroenterology | Admitting: Gastroenterology

## 2018-06-13 DIAGNOSIS — K838 Other specified diseases of biliary tract: Secondary | ICD-10-CM

## 2018-06-26 ENCOUNTER — Ambulatory Visit: Payer: Self-pay | Admitting: General Surgery

## 2018-06-30 ENCOUNTER — Encounter (HOSPITAL_COMMUNITY): Payer: Self-pay | Admitting: *Deleted

## 2018-06-30 ENCOUNTER — Other Ambulatory Visit: Payer: Self-pay

## 2018-06-30 NOTE — Progress Notes (Signed)
Spoke with pt for pre-op call. Pt denies cardiac history. States she has Pre-diabetes. States she does not check her blood sugar at home. Pt has hx of 2 DVT's and is on Xarelto, last dose was Sunday AM, 06/29/18.

## 2018-07-01 ENCOUNTER — Ambulatory Visit (HOSPITAL_COMMUNITY)
Admission: RE | Admit: 2018-07-01 | Discharge: 2018-07-04 | Disposition: A | Discharge status: Home / Self Care | Payer: Medicare Other | Source: Ambulatory Visit | Attending: General Surgery | Admitting: General Surgery

## 2018-07-01 ENCOUNTER — Ambulatory Visit (HOSPITAL_COMMUNITY): Payer: Medicare Other | Admitting: Certified Registered"

## 2018-07-01 ENCOUNTER — Ambulatory Visit (HOSPITAL_COMMUNITY): Payer: Medicare Other

## 2018-07-01 ENCOUNTER — Encounter (HOSPITAL_COMMUNITY)
Admission: RE | Disposition: A | Discharge status: Home / Self Care | Payer: Self-pay | Source: Ambulatory Visit | Attending: General Surgery

## 2018-07-01 ENCOUNTER — Other Ambulatory Visit: Payer: Self-pay

## 2018-07-01 ENCOUNTER — Encounter (HOSPITAL_COMMUNITY): Payer: Self-pay

## 2018-07-01 DIAGNOSIS — E039 Hypothyroidism, unspecified: Secondary | ICD-10-CM | POA: Diagnosis not present

## 2018-07-01 DIAGNOSIS — K8020 Calculus of gallbladder without cholecystitis without obstruction: Secondary | ICD-10-CM

## 2018-07-01 DIAGNOSIS — Z7901 Long term (current) use of anticoagulants: Secondary | ICD-10-CM | POA: Insufficient documentation

## 2018-07-01 DIAGNOSIS — Z79899 Other long term (current) drug therapy: Secondary | ICD-10-CM | POA: Diagnosis not present

## 2018-07-01 DIAGNOSIS — K8010 Calculus of gallbladder with chronic cholecystitis without obstruction: Secondary | ICD-10-CM | POA: Diagnosis present

## 2018-07-01 DIAGNOSIS — Z881 Allergy status to other antibiotic agents status: Secondary | ICD-10-CM | POA: Insufficient documentation

## 2018-07-01 DIAGNOSIS — Z940 Kidney transplant status: Secondary | ICD-10-CM | POA: Insufficient documentation

## 2018-07-01 DIAGNOSIS — K219 Gastro-esophageal reflux disease without esophagitis: Secondary | ICD-10-CM | POA: Insufficient documentation

## 2018-07-01 DIAGNOSIS — Z880 Allergy status to penicillin: Secondary | ICD-10-CM | POA: Insufficient documentation

## 2018-07-01 DIAGNOSIS — E1151 Type 2 diabetes mellitus with diabetic peripheral angiopathy without gangrene: Secondary | ICD-10-CM | POA: Insufficient documentation

## 2018-07-01 DIAGNOSIS — I509 Heart failure, unspecified: Secondary | ICD-10-CM | POA: Diagnosis not present

## 2018-07-01 DIAGNOSIS — Z885 Allergy status to narcotic agent status: Secondary | ICD-10-CM | POA: Insufficient documentation

## 2018-07-01 DIAGNOSIS — I2510 Atherosclerotic heart disease of native coronary artery without angina pectoris: Secondary | ICD-10-CM | POA: Insufficient documentation

## 2018-07-01 DIAGNOSIS — I110 Hypertensive heart disease with heart failure: Secondary | ICD-10-CM | POA: Insufficient documentation

## 2018-07-01 DIAGNOSIS — Z86718 Personal history of other venous thrombosis and embolism: Secondary | ICD-10-CM | POA: Insufficient documentation

## 2018-07-01 DIAGNOSIS — J449 Chronic obstructive pulmonary disease, unspecified: Secondary | ICD-10-CM | POA: Insufficient documentation

## 2018-07-01 DIAGNOSIS — R071 Chest pain on breathing: Secondary | ICD-10-CM

## 2018-07-01 DIAGNOSIS — F329 Major depressive disorder, single episode, unspecified: Secondary | ICD-10-CM | POA: Insufficient documentation

## 2018-07-01 DIAGNOSIS — K8080 Other cholelithiasis without obstruction: Secondary | ICD-10-CM | POA: Diagnosis present

## 2018-07-01 DIAGNOSIS — Z888 Allergy status to other drugs, medicaments and biological substances status: Secondary | ICD-10-CM | POA: Diagnosis not present

## 2018-07-01 DIAGNOSIS — F419 Anxiety disorder, unspecified: Secondary | ICD-10-CM | POA: Diagnosis not present

## 2018-07-01 HISTORY — DX: Gastro-esophageal reflux disease without esophagitis: K21.9

## 2018-07-01 HISTORY — DX: Prediabetes: R73.03

## 2018-07-01 HISTORY — DX: Pneumonia, unspecified organism: J18.9

## 2018-07-01 HISTORY — DX: Unspecified osteoarthritis, unspecified site: M19.90

## 2018-07-01 HISTORY — DX: Unspecified asthma, uncomplicated: J45.909

## 2018-07-01 HISTORY — PX: CHOLECYSTECTOMY: SHX55

## 2018-07-01 LAB — COMPREHENSIVE METABOLIC PANEL
ALBUMIN: 3.2 g/dL — AB (ref 3.5–5.0)
ALK PHOS: 54 U/L (ref 38–126)
ALT: 13 U/L (ref 0–44)
AST: 31 U/L (ref 15–41)
Anion gap: 11 (ref 5–15)
BUN: 12 mg/dL (ref 8–23)
CALCIUM: 11 mg/dL — AB (ref 8.9–10.3)
CO2: 22 mmol/L (ref 22–32)
CREATININE: 1.93 mg/dL — AB (ref 0.44–1.00)
Chloride: 110 mmol/L (ref 98–111)
GFR calc Af Amer: 26 mL/min — ABNORMAL LOW (ref >60)
GFR calc non Af Amer: 23 mL/min — ABNORMAL LOW (ref >60)
GLUCOSE: 102 mg/dL — AB (ref 70–99)
Potassium: 4.2 mmol/L (ref 3.5–5.1)
SODIUM: 143 mmol/L (ref 135–145)
Total Bilirubin: 1.1 mg/dL (ref 0.3–1.2)
Total Protein: 7 g/dL (ref 6.5–8.1)

## 2018-07-01 LAB — CBC
HEMATOCRIT: 42 % (ref 36.0–46.0)
HEMOGLOBIN: 13.3 g/dL (ref 12.0–15.0)
MCH: 31.3 pg (ref 26.0–34.0)
MCHC: 31.7 g/dL (ref 30.0–36.0)
MCV: 98.8 fL (ref 78.0–100.0)
Platelets: 194 10*3/uL (ref 150–400)
RBC: 4.25 MIL/uL (ref 3.87–5.11)
RDW: 13.5 % (ref 11.5–15.5)
WBC: 6.1 10*3/uL (ref 4.0–10.5)

## 2018-07-01 LAB — PROTIME-INR
INR: 1.02
Prothrombin Time: 13.3 seconds (ref 11.4–15.2)

## 2018-07-01 LAB — GLUCOSE, CAPILLARY: GLUCOSE-CAPILLARY: 91 mg/dL (ref 70–99)

## 2018-07-01 SURGERY — LAPAROSCOPIC CHOLECYSTECTOMY WITH INTRAOPERATIVE CHOLANGIOGRAM
Anesthesia: General | Site: Abdomen

## 2018-07-01 MED ORDER — FLUTICASONE PROPIONATE 50 MCG/ACT NA SUSP: 1.0000 | Freq: Every day | NASAL | Status: DC | PRN

## 2018-07-01 MED ORDER — PROMETHAZINE HCL 25 MG/ML IJ SOLN: 6.2500 mg | INTRAMUSCULAR | Status: DC | PRN

## 2018-07-01 MED ORDER — 0.9 % SODIUM CHLORIDE (POUR BTL) OPTIME
TOPICAL | Status: DC | PRN
  Administered 2018-07-01: 1000 mL

## 2018-07-01 MED ORDER — SODIUM CHLORIDE 0.9 % IR SOLN
Status: DC | PRN
  Administered 2018-07-01: 1000 mL

## 2018-07-01 MED ORDER — PROPOFOL 10 MG/ML IV BOLUS
INTRAVENOUS | Status: AC
  Filled 2018-07-01: qty 20

## 2018-07-01 MED ORDER — ZINC SULFATE 220 (50 ZN) MG PO CAPS
220.0000 mg | ORAL_CAPSULE | Freq: Every evening | ORAL | Status: DC
  Administered 2018-07-01 – 2018-07-04 (×4): 220 mg via ORAL
  Filled 2018-07-01 (×4): qty 1

## 2018-07-01 MED ORDER — FUROSEMIDE 80 MG PO TABS
80.0000 mg | ORAL_TABLET | Freq: Every day | ORAL | Status: DC
  Administered 2018-07-02 – 2018-07-04 (×3): 80 mg via ORAL
  Filled 2018-07-01 (×3): qty 1

## 2018-07-01 MED ORDER — SULFAMETHOXAZOLE-TRIMETHOPRIM 400-80 MG PO TABS
1.0000 | ORAL_TABLET | ORAL | Status: DC
  Administered 2018-07-02 – 2018-07-04 (×2): 1 via ORAL
  Filled 2018-07-01 (×2): qty 1

## 2018-07-01 MED ORDER — OXYBUTYNIN CHLORIDE ER 10 MG PO TB24
10.0000 mg | ORAL_TABLET | Freq: Every day | ORAL | Status: DC
  Administered 2018-07-01 – 2018-07-04 (×4): 10 mg via ORAL
  Filled 2018-07-01 (×4): qty 1

## 2018-07-01 MED ORDER — GABAPENTIN 300 MG PO CAPS
300.0000 mg | ORAL_CAPSULE | ORAL | Status: AC
  Administered 2018-07-01: 300 mg via ORAL
  Filled 2018-07-01: qty 1

## 2018-07-01 MED ORDER — CLONIDINE HCL 0.1 MG PO TABS
0.1000 mg | ORAL_TABLET | Freq: Two times a day (BID) | ORAL | Status: DC
  Administered 2018-07-01 – 2018-07-04 (×6): 0.1 mg via ORAL
  Filled 2018-07-01 (×6): qty 1

## 2018-07-01 MED ORDER — AZATHIOPRINE 50 MG PO TABS
50.0000 mg | ORAL_TABLET | Freq: Two times a day (BID) | ORAL | Status: DC
  Administered 2018-07-01 – 2018-07-04 (×6): 50 mg via ORAL
  Filled 2018-07-01 (×6): qty 1

## 2018-07-01 MED ORDER — RIVAROXABAN 10 MG PO TABS
10.0000 mg | ORAL_TABLET | Freq: Every evening | ORAL | Status: DC
  Administered 2018-07-02 – 2018-07-04 (×3): 10 mg via ORAL
  Filled 2018-07-01 (×3): qty 1

## 2018-07-01 MED ORDER — FENTANYL CITRATE (PF) 100 MCG/2ML IJ SOLN
25.0000 ug | INTRAMUSCULAR | Status: DC | PRN
  Administered 2018-07-01: 50 ug via INTRAVENOUS

## 2018-07-01 MED ORDER — ONDANSETRON HCL 4 MG/2ML IJ SOLN: 4.0000 mg | Freq: Four times a day (QID) | INTRAMUSCULAR | Status: DC | PRN

## 2018-07-01 MED ORDER — PANTOPRAZOLE SODIUM 40 MG PO TBEC
40.0000 mg | DELAYED_RELEASE_TABLET | Freq: Every day | ORAL | Status: DC
  Administered 2018-07-01 – 2018-07-04 (×4): 40 mg via ORAL
  Filled 2018-07-01 (×4): qty 1

## 2018-07-01 MED ORDER — FENTANYL CITRATE (PF) 250 MCG/5ML IJ SOLN
INTRAMUSCULAR | Status: AC
  Filled 2018-07-01: qty 5

## 2018-07-01 MED ORDER — SODIUM CHLORIDE 0.9 % IV SOLN
INTRAVENOUS | Status: DC | PRN
  Administered 2018-07-01: 13:00:00 via INTRAVENOUS

## 2018-07-01 MED ORDER — B COMPLEX-C PO TABS
1.0000 | ORAL_TABLET | Freq: Every day | ORAL | Status: DC
  Administered 2018-07-01 – 2018-07-04 (×4): 1 via ORAL
  Filled 2018-07-01 (×4): qty 1

## 2018-07-01 MED ORDER — RISAQUAD PO CAPS
1.0000 | ORAL_CAPSULE | Freq: Every evening | ORAL | Status: DC
  Administered 2018-07-01 – 2018-07-04 (×4): 1 via ORAL
  Filled 2018-07-01 (×4): qty 1

## 2018-07-01 MED ORDER — CIPROFLOXACIN IN D5W 400 MG/200ML IV SOLN
400.0000 mg | INTRAVENOUS | Status: AC
  Administered 2018-07-01: 400 mg via INTRAVENOUS

## 2018-07-01 MED ORDER — DIPHENHYDRAMINE-ZINC ACETATE 2-0.1 % EX CREA: 1.0000 "application " | TOPICAL_CREAM | Freq: Every day | CUTANEOUS | Status: DC | PRN

## 2018-07-01 MED ORDER — DEXAMETHASONE SODIUM PHOSPHATE 10 MG/ML IJ SOLN
INTRAMUSCULAR | Status: AC
  Filled 2018-07-01: qty 1

## 2018-07-01 MED ORDER — L-LYSINE 500 MG PO TABS: 500.0000 mg | ORAL_TABLET | Freq: Every day | ORAL | Status: DC

## 2018-07-01 MED ORDER — FERROUS SULFATE 325 (65 FE) MG PO TABS
325.0000 mg | ORAL_TABLET | Freq: Every day | ORAL | Status: DC
  Administered 2018-07-02 – 2018-07-04 (×3): 325 mg via ORAL
  Filled 2018-07-01 (×3): qty 1

## 2018-07-01 MED ORDER — COLESEVELAM HCL 625 MG PO TABS
1875.0000 mg | ORAL_TABLET | Freq: Two times a day (BID) | ORAL | Status: DC
  Administered 2018-07-01 – 2018-07-04 (×7): 1875 mg via ORAL
  Filled 2018-07-01 (×8): qty 3

## 2018-07-01 MED ORDER — CIPROFLOXACIN IN D5W 400 MG/200ML IV SOLN
INTRAVENOUS | Status: AC
  Filled 2018-07-01: qty 200

## 2018-07-01 MED ORDER — FAMOTIDINE IN NACL 20-0.9 MG/50ML-% IV SOLN
20.0000 mg | INTRAVENOUS | Status: DC
  Administered 2018-07-01 – 2018-07-02 (×2): 20 mg via INTRAVENOUS
  Filled 2018-07-01 (×2): qty 50

## 2018-07-01 MED ORDER — COQ10 200 MG PO CAPS: 200.0000 mg | ORAL_CAPSULE | Freq: Every day | ORAL | Status: DC

## 2018-07-01 MED ORDER — DEXAMETHASONE SODIUM PHOSPHATE 10 MG/ML IJ SOLN
INTRAMUSCULAR | Status: DC | PRN
  Administered 2018-07-01: 5 mg via INTRAVENOUS

## 2018-07-01 MED ORDER — ONDANSETRON HCL 4 MG/2ML IJ SOLN
4.0000 mg | Freq: Once | INTRAMUSCULAR | Status: AC
  Administered 2018-07-01: 4 mg via INTRAVENOUS

## 2018-07-01 MED ORDER — METOPROLOL TARTRATE 5 MG/5ML IV SOLN
5.0000 mg | Freq: Four times a day (QID) | INTRAVENOUS | Status: DC | PRN
  Administered 2018-07-01: 5 mg via INTRAVENOUS
  Filled 2018-07-01 (×2): qty 5

## 2018-07-01 MED ORDER — PREDNISONE 5 MG PO TABS
5.0000 mg | ORAL_TABLET | Freq: Every day | ORAL | Status: DC
  Administered 2018-07-02 – 2018-07-04 (×3): 5 mg via ORAL
  Filled 2018-07-01 (×3): qty 1

## 2018-07-01 MED ORDER — EPHEDRINE SULFATE-NACL 50-0.9 MG/10ML-% IV SOSY
PREFILLED_SYRINGE | INTRAVENOUS | Status: DC | PRN
  Administered 2018-07-01: 5 mg via INTRAVENOUS

## 2018-07-01 MED ORDER — CELECOXIB 200 MG PO CAPS
200.0000 mg | ORAL_CAPSULE | ORAL | Status: AC
  Administered 2018-07-01: 200 mg via ORAL
  Filled 2018-07-01: qty 1

## 2018-07-01 MED ORDER — LUTEIN-ZEAXANTHIN 25-5 MG PO CAPS: 1.0000 | ORAL_CAPSULE | Freq: Every day | ORAL | Status: DC

## 2018-07-01 MED ORDER — METOPROLOL SUCCINATE ER 100 MG PO TB24
100.0000 mg | ORAL_TABLET | Freq: Every evening | ORAL | Status: DC
  Administered 2018-07-01 – 2018-07-04 (×4): 100 mg via ORAL
  Filled 2018-07-01 (×4): qty 1

## 2018-07-01 MED ORDER — ONDANSETRON HCL 4 MG PO TABS: 4.0000 mg | ORAL_TABLET | Freq: Three times a day (TID) | ORAL | Status: DC | PRN

## 2018-07-01 MED ORDER — TRAMADOL HCL 50 MG PO TABS
50.0000 mg | ORAL_TABLET | Freq: Four times a day (QID) | ORAL | Status: DC | PRN
  Administered 2018-07-01 – 2018-07-04 (×5): 50 mg via ORAL
  Filled 2018-07-01 (×4): qty 1

## 2018-07-01 MED ORDER — ADULT MULTIVITAMIN W/MINERALS CH
1.0000 | ORAL_TABLET | Freq: Every evening | ORAL | Status: DC
  Administered 2018-07-01 – 2018-07-04 (×4): 1 via ORAL
  Filled 2018-07-01 (×4): qty 1

## 2018-07-01 MED ORDER — LIDOCAINE 2% (20 MG/ML) 5 ML SYRINGE
INTRAMUSCULAR | Status: DC | PRN
  Administered 2018-07-01: 50 mg via INTRAVENOUS

## 2018-07-01 MED ORDER — ONDANSETRON 4 MG PO TBDP: 4.0000 mg | ORAL_TABLET | Freq: Four times a day (QID) | ORAL | Status: DC | PRN

## 2018-07-01 MED ORDER — BUPIVACAINE-EPINEPHRINE 0.25% -1:200000 IJ SOLN
INTRAMUSCULAR | Status: DC | PRN
  Administered 2018-07-01: 17 mL

## 2018-07-01 MED ORDER — MOMETASONE FURO-FORMOTEROL FUM 200-5 MCG/ACT IN AERO
2.0000 | INHALATION_SPRAY | Freq: Two times a day (BID) | RESPIRATORY_TRACT | Status: DC
  Administered 2018-07-01 – 2018-07-03 (×4): 2 via RESPIRATORY_TRACT
  Filled 2018-07-01: qty 8.8

## 2018-07-01 MED ORDER — FUROSEMIDE 40 MG PO TABS
40.0000 mg | ORAL_TABLET | Freq: Every day | ORAL | Status: DC
  Administered 2018-07-01 – 2018-07-04 (×4): 40 mg via ORAL
  Filled 2018-07-01 (×4): qty 1

## 2018-07-01 MED ORDER — HEPARIN SODIUM (PORCINE) 5000 UNIT/ML IJ SOLN: 5000.0000 [IU] | Freq: Three times a day (TID) | INTRAMUSCULAR | Status: DC

## 2018-07-01 MED ORDER — PHENYLEPHRINE 40 MCG/ML (10ML) SYRINGE FOR IV PUSH (FOR BLOOD PRESSURE SUPPORT)
PREFILLED_SYRINGE | INTRAVENOUS | Status: AC
  Filled 2018-07-01: qty 10

## 2018-07-01 MED ORDER — FLUOXETINE HCL 20 MG PO CAPS
20.0000 mg | ORAL_CAPSULE | Freq: Every day | ORAL | Status: DC
  Administered 2018-07-02 – 2018-07-04 (×3): 20 mg via ORAL
  Filled 2018-07-01 (×4): qty 1

## 2018-07-01 MED ORDER — LIDOCAINE 2% (20 MG/ML) 5 ML SYRINGE
INTRAMUSCULAR | Status: AC
  Filled 2018-07-01: qty 5

## 2018-07-01 MED ORDER — LEVOTHYROXINE SODIUM 75 MCG PO TABS
75.0000 ug | ORAL_TABLET | Freq: Every day | ORAL | Status: DC
  Administered 2018-07-02 – 2018-07-04 (×3): 75 ug via ORAL
  Filled 2018-07-01 (×3): qty 1

## 2018-07-01 MED ORDER — FENTANYL CITRATE (PF) 100 MCG/2ML IJ SOLN
INTRAMUSCULAR | Status: DC | PRN
  Administered 2018-07-01: 50 ug via INTRAVENOUS
  Administered 2018-07-01: 25 ug via INTRAVENOUS

## 2018-07-01 MED ORDER — SODIUM CHLORIDE 0.9 % IV SOLN
INTRAVENOUS | Status: DC
  Administered 2018-07-01: 12:00:00 via INTRAVENOUS

## 2018-07-01 MED ORDER — SUGAMMADEX SODIUM 200 MG/2ML IV SOLN
INTRAVENOUS | Status: DC | PRN
  Administered 2018-07-01: 125 mg via INTRAVENOUS

## 2018-07-01 MED ORDER — CYCLOSPORINE MODIFIED (NEORAL) 100 MG PO CAPS
100.0000 mg | ORAL_CAPSULE | Freq: Two times a day (BID) | ORAL | Status: DC
  Administered 2018-07-01 – 2018-07-04 (×6): 100 mg via ORAL
  Filled 2018-07-01 (×7): qty 1

## 2018-07-01 MED ORDER — ONDANSETRON HCL 4 MG/2ML IJ SOLN
INTRAMUSCULAR | Status: AC
  Filled 2018-07-01: qty 2

## 2018-07-01 MED ORDER — FENTANYL CITRATE (PF) 100 MCG/2ML IJ SOLN
12.5000 ug | INTRAMUSCULAR | Status: DC | PRN
  Administered 2018-07-03 – 2018-07-04 (×4): 25 ug via INTRAVENOUS
  Filled 2018-07-01 (×4): qty 2

## 2018-07-01 MED ORDER — ALBUTEROL SULFATE (2.5 MG/3ML) 0.083% IN NEBU: 2.5000 mg | INHALATION_SOLUTION | Freq: Four times a day (QID) | RESPIRATORY_TRACT | Status: DC | PRN

## 2018-07-01 MED ORDER — FUROSEMIDE 40 MG PO TABS: 40.0000 mg | ORAL_TABLET | ORAL | Status: DC

## 2018-07-01 MED ORDER — PROPOFOL 10 MG/ML IV BOLUS
INTRAVENOUS | Status: DC | PRN
  Administered 2018-07-01: 80 mg via INTRAVENOUS

## 2018-07-01 MED ORDER — LORATADINE 10 MG PO TABS
10.0000 mg | ORAL_TABLET | Freq: Every day | ORAL | Status: DC
  Administered 2018-07-02 – 2018-07-04 (×3): 10 mg via ORAL
  Filled 2018-07-01 (×3): qty 1

## 2018-07-01 MED ORDER — VITAMIN D 1000 UNITS PO TABS
2000.0000 [IU] | ORAL_TABLET | Freq: Every evening | ORAL | Status: DC
  Administered 2018-07-01 – 2018-07-04 (×4): 2000 [IU] via ORAL
  Filled 2018-07-01 (×4): qty 2

## 2018-07-01 MED ORDER — RESVERATROL 100 MG PO CAPS: 100.0000 mg | ORAL_CAPSULE | Freq: Every evening | ORAL | Status: DC

## 2018-07-01 MED ORDER — LACTATED RINGERS IV SOLN: INTRAVENOUS | Status: DC

## 2018-07-01 MED ORDER — PHENYLEPH-SHARK LIV OIL-MO-PET 0.25-3-14-71.9 % RE OINT: 1.0000 "application " | TOPICAL_OINTMENT | Freq: Two times a day (BID) | RECTAL | Status: DC | PRN

## 2018-07-01 MED ORDER — ACETAMINOPHEN 500 MG PO TABS
1000.0000 mg | ORAL_TABLET | ORAL | Status: AC
  Administered 2018-07-01: 1000 mg via ORAL
  Filled 2018-07-01: qty 2

## 2018-07-01 MED ORDER — ROCURONIUM BROMIDE 10 MG/ML (PF) SYRINGE
PREFILLED_SYRINGE | INTRAVENOUS | Status: DC | PRN
  Administered 2018-07-01: 30 mg via INTRAVENOUS

## 2018-07-01 MED ORDER — CHLORHEXIDINE GLUCONATE CLOTH 2 % EX PADS: 6.0000 | MEDICATED_PAD | Freq: Once | CUTANEOUS | Status: DC

## 2018-07-01 MED ORDER — SODIUM CHLORIDE 0.9 % IV SOLN
INTRAVENOUS | Status: DC | PRN
  Administered 2018-07-01: 7 mL

## 2018-07-01 MED ORDER — PROMETHAZINE HCL 25 MG PO TABS: 25.0000 mg | ORAL_TABLET | Freq: Four times a day (QID) | ORAL | Status: DC | PRN

## 2018-07-01 MED ORDER — ROCURONIUM BROMIDE 50 MG/5ML IV SOSY
PREFILLED_SYRINGE | INTRAVENOUS | Status: AC
  Filled 2018-07-01: qty 5

## 2018-07-01 MED ORDER — ONDANSETRON HCL 4 MG/2ML IJ SOLN
INTRAMUSCULAR | Status: AC
  Administered 2018-07-01: 4 mg via INTRAVENOUS
  Filled 2018-07-01: qty 2

## 2018-07-01 MED ORDER — FENTANYL CITRATE (PF) 100 MCG/2ML IJ SOLN
INTRAMUSCULAR | Status: AC
  Filled 2018-07-01: qty 2

## 2018-07-01 MED ORDER — KCL IN DEXTROSE-NACL 20-5-0.9 MEQ/L-%-% IV SOLN
INTRAVENOUS | Status: DC
  Administered 2018-07-01: 17:00:00 via INTRAVENOUS
  Filled 2018-07-01 (×2): qty 1000

## 2018-07-01 MED ORDER — MIRABEGRON ER 25 MG PO TB24
25.0000 mg | ORAL_TABLET | Freq: Every evening | ORAL | Status: DC
  Administered 2018-07-01 – 2018-07-04 (×4): 25 mg via ORAL
  Filled 2018-07-01 (×4): qty 1

## 2018-07-01 MED ORDER — TRAMADOL HCL 50 MG PO TABS
ORAL_TABLET | ORAL | Status: AC
  Filled 2018-07-01: qty 1

## 2018-07-01 SURGICAL SUPPLY — 41 items
ADH SKN CLS APL DERMABOND .7 (GAUZE/BANDAGES/DRESSINGS) ×1
APPLIER CLIP 5 13 M/L LIGAMAX5 (MISCELLANEOUS) ×3
APR CLP MED LRG 5 ANG JAW (MISCELLANEOUS) ×1
BAG SPEC RTRVL LRG 6X4 10 (ENDOMECHANICALS) ×1
BLADE CLIPPER SURG (BLADE) IMPLANT
CANISTER SUCT 3000ML PPV (MISCELLANEOUS) ×3 IMPLANT
CATH REDDICK CHOLANGI 4FR 50CM (CATHETERS) ×3 IMPLANT
CHLORAPREP W/TINT 26ML (MISCELLANEOUS) ×3 IMPLANT
CLIP APPLIE 5 13 M/L LIGAMAX5 (MISCELLANEOUS) ×1 IMPLANT
COVER MAYO STAND STRL (DRAPES) ×3 IMPLANT
COVER SURGICAL LIGHT HANDLE (MISCELLANEOUS) ×3 IMPLANT
DERMABOND ADVANCED (GAUZE/BANDAGES/DRESSINGS) ×2
DERMABOND ADVANCED .7 DNX12 (GAUZE/BANDAGES/DRESSINGS) ×1 IMPLANT
DRAPE C-ARM 42X72 X-RAY (DRAPES) ×3 IMPLANT
ELECT REM PT RETURN 9FT ADLT (ELECTROSURGICAL) ×3
ELECTRODE REM PT RTRN 9FT ADLT (ELECTROSURGICAL) ×1 IMPLANT
ENDOLOOP SUT PDS II  0 18 (SUTURE) ×2
ENDOLOOP SUT PDS II 0 18 (SUTURE) IMPLANT
GLOVE BIO SURGEON STRL SZ7.5 (GLOVE) ×3 IMPLANT
GOWN STRL REUS W/ TWL LRG LVL3 (GOWN DISPOSABLE) ×3 IMPLANT
GOWN STRL REUS W/TWL LRG LVL3 (GOWN DISPOSABLE) ×9
GRASPER SUT TROCAR 14GX15 (MISCELLANEOUS) ×2 IMPLANT
IV CATH 14GX2 1/4 (CATHETERS) ×3 IMPLANT
KIT BASIN OR (CUSTOM PROCEDURE TRAY) ×3 IMPLANT
KIT TURNOVER KIT B (KITS) ×3 IMPLANT
NS IRRIG 1000ML POUR BTL (IV SOLUTION) ×3 IMPLANT
PAD ARMBOARD 7.5X6 YLW CONV (MISCELLANEOUS) ×3 IMPLANT
POUCH SPECIMEN RETRIEVAL 10MM (ENDOMECHANICALS) ×3 IMPLANT
SCISSORS LAP 5X35 DISP (ENDOMECHANICALS) ×3 IMPLANT
SET IRRIG TUBING LAPAROSCOPIC (IRRIGATION / IRRIGATOR) ×3 IMPLANT
SLEEVE ENDOPATH XCEL 5M (ENDOMECHANICALS) ×8 IMPLANT
SPECIMEN JAR SMALL (MISCELLANEOUS) ×3 IMPLANT
SUT MNCRL AB 4-0 PS2 18 (SUTURE) ×3 IMPLANT
TOWEL OR 17X24 6PK STRL BLUE (TOWEL DISPOSABLE) ×3 IMPLANT
TOWEL OR 17X26 10 PK STRL BLUE (TOWEL DISPOSABLE) ×3 IMPLANT
TRAY LAPAROSCOPIC MC (CUSTOM PROCEDURE TRAY) ×3 IMPLANT
TROCAR XCEL BLUNT TIP 100MML (ENDOMECHANICALS) ×3 IMPLANT
TROCAR XCEL NON-BLD 11X100MML (ENDOMECHANICALS) ×2 IMPLANT
TROCAR XCEL NON-BLD 5MMX100MML (ENDOMECHANICALS) ×3 IMPLANT
TUBING INSUFFLATION (TUBING) ×3 IMPLANT
WATER STERILE IRR 1000ML POUR (IV SOLUTION) ×3 IMPLANT

## 2018-07-01 NOTE — Op Note (Signed)
07/01/2018  2:02 PM  PATIENT:  Patricia [name]  84 y.o. female  PRE-OPERATIVE DIAGNOSIS:  GALLSTONES  POST-OPERATIVE DIAGNOSIS:  GALLSTONES with cholecystitis  PROCEDURE:  Procedure(s): LAPAROSCOPIC CHOLECYSTECTOMY WITH INTRAOPERATIVE CHOLANGIOGRAM (N/A)  SURGEON:  Surgeon(s) and Role:    * Jovita Kussmaul, MD - Primary  PHYSICIAN ASSISTANT:   ASSISTANTS: none   ANESTHESIA:   local and general  EBL:  minimal   BLOOD ADMINISTERED:none  DRAINS: none   LOCAL MEDICATIONS USED:  MARCAINE     SPECIMEN:  Source of Specimen:  gallbladder  DISPOSITION OF SPECIMEN:  PATHOLOGY  COUNTS:  YES  TOURNIQUET:  * No tourniquets in log *  DICTATION: .Dragon Dictation     Procedure: After informed consent was obtained the patient was brought to the operating room and placed in the supine position on the operating room table. After adequate induction of general anesthesia the patient's abdomen was prepped with ChloraPrep allowed to dry and draped in usual sterile manner. An appropriate timeout was performed. The area below the umbilicus was infiltrated with quarter percent  Marcaine. A small incision was made with a 15 blade knife. The incision was carried down through the subcutaneous tissue bluntly with a hemostat and [employer] retractors. The linea alba was identified. The linea alba was incised with a 15 blade knife and each side was grasped with Coker clamps. The preperitoneal space was then probed with a hemostat until the peritoneum was opened and access was gained to the abdominal cavity. A 0 Vicryl pursestring stitch was placed in the fascia surrounding the opening. A Hassan cannula was then placed through the opening and anchored in place with the previously placed Vicryl purse string stitch. The abdomen was insufflated with carbon dioxide without difficulty. A laparoscope was inserted through the [facility] cannula in the right upper quadrant was inspected. Next the epigastric region was  infiltrated with % Marcaine. A small incision was made with a 15 blade knife. A 5 mm port was placed bluntly through this incision into the abdominal cavity under direct vision. Next 2 sites were chosen laterally on the right side of the abdomen for placement of 5 mm ports. Each of these areas was infiltrated with quarter percent Marcaine. Small stab incisions were made with a 15 blade knife. 5 mm ports were then placed bluntly through these incisions into the abdominal cavity under direct vision without difficulty. A blunt grasper was placed through the lateralmost 5 mm port and used to grasp the dome of the gallbladder and elevated anteriorly and superiorly. Another blunt grasper was placed through the other 5 mm port and used to retract the body and neck of the gallbladder. A dissector was placed through the epigastric port and using the electrocautery the peritoneal reflection at the gallbladder neck was opened. Blunt dissection was then carried out in this area until the gallbladder neck-cystic duct junction was readily identified and a good window was created. A single clip was placed on the gallbladder neck. A small  ductotomy was made just below the clip with laparoscopic scissors. A 14-gauge Angiocath was then placed through the anterior abdominal wall under direct vision. A Reddick cholangiogram catheter was then placed through the Angiocath and flushed. The catheter was then placed in the cystic duct and anchored in place with a clip. A cholangiogram was obtained that showed no filling defects, good emptying into the duodenum, a short cystic duct and the ducts were quite dilated. The anchoring clip and catheters were then removed from  the patient. 2 clips were placed proximally on the cystic duct as well as an endoloop pds and the duct was divided between the 2 sets of clips. Posterior to this the cystic artery was identified and again dissected bluntly in a circumferential manner until a good window   was created. 2 clips were placed proximally and one distally on the artery and the artery was divided between the 2 sets of clips. Next a laparoscopic hook cautery device was used to separate the gallbladder from the liver bed. Prior to completely detaching the gallbladder from the liver bed the liver bed was inspected and several small bleeding points were coagulated with the electrocautery until the area was completely hemostatic. The gallbladder was then detached the rest of it from the liver bed without difficulty. A laparoscopic bag was inserted through the hassan port. The laparoscope was moved to the epigastric port. The gallbladder was placed within the bag and the bag was sealed.  The bag with the gallbladder was then removed with the [facility] cannula through the infraumbilical port without difficulty. The fascial defect was then closed with the previously placed Vicryl pursestring stitch as well as with another figure-of-eight 0 Vicryl stitch. The liver bed was inspected again and found to be hemostatic. The abdomen was irrigated with copious amounts of saline until the effluent was clear. The ports were then removed under direct vision without difficulty and were found to be hemostatic. The gas was allowed to escape. The skin incisions were all closed with interrupted 4-0 Monocryl subcuticular stitches. Dermabond dressings were applied. The patient tolerated the procedure well. At the end of the case all needle sponge and instrument counts were correct. The patient was then awakened and taken to recovery in stable condition  PLAN OF CARE: Admit for overnight observation  PATIENT DISPOSITION:  PACU - hemodynamically stable.   Delay start of Pharmacological VTE agent (>24hrs) due to surgical blood loss or risk of bleeding: no

## 2018-07-01 NOTE — Progress Notes (Signed)
Spoke with dr germeroth re: sbp > 180 prior to going to room / pt in preop was 205/70's / Dr Lissa Hoard elects not to treat and is ok with pt going to floor.

## 2018-07-01 NOTE — Anesthesia Preprocedure Evaluation (Addendum)
Anesthesia Evaluation  Patient identified by MRN, date of birth, ID band Patient awake    Reviewed: Allergy & Precautions, NPO status , Patient's Chart, lab work & pertinent test results, reviewed documented beta blocker date and time   Airway Mallampati: II  TM Distance: >3 FB Neck ROM: Full    Dental  (+) Teeth Intact, Dental Advisory Given, Edentulous Upper   Pulmonary COPD,  COPD inhaler,    Pulmonary exam normal breath sounds clear to auscultation       Cardiovascular hypertension, Pt. on medications and Pt. on home beta blockers + CAD, + Peripheral Vascular Disease, +CHF and + DVT  Normal cardiovascular exam Rhythm:Regular Rate:Normal  Study Conclusions  - Left ventricle: The cavity size was normal. Wall thickness was   normal. Systolic function was normal. The estimated ejection   fraction was in the range of 60% to 65%. Wall motion was normal;   there were no regional wall motion abnormalities. Features are   consistent with a pseudonormal left ventricular filling pattern,   with concomitant abnormal relaxation and increased filling   pressure (grade 2 diastolic dysfunction). - Mitral valve: Calcified annulus. There was mild regurgitation. - Left atrium: The atrium was mildly dilated. - Pulmonary arteries: Systolic pressure was mildly increased. PA   peak pressure: 34 mm Hg (S).   Neuro/Psych PSYCHIATRIC DISORDERS Anxiety Depression negative neurological ROS     GI/Hepatic Neg liver ROS, GERD  Medicated,  Endo/Other  diabetesHypothyroidism Obesity   Renal/GU Renal InsufficiencyRenal disease (s/p transplant)S/p renal tx     Musculoskeletal Right femoral neck fracture    Abdominal   Peds  Hematology  (+) Blood dyscrasia (warfarin therapy), ,   Anesthesia Other Findings Day of surgery medications reviewed with the patient.  Reproductive/Obstetrics                            Anesthesia Physical  Anesthesia Plan  ASA: III  Anesthesia Plan: General   Post-op Pain Management:    Induction: Intravenous  PONV Risk Score and Plan: 4 or greater and Ondansetron, Dexamethasone, Diphenhydramine and Treatment may vary due to age or medical condition  Airway Management Planned: Oral ETT  Additional Equipment:   Intra-op Plan:   Post-operative Plan: Extubation in OR  Informed Consent: I have reviewed the patients History and Physical, chart, labs and discussed the procedure including the risks, benefits and alternatives for the proposed anesthesia with the patient or authorized representative who has indicated his/her understanding and acceptance.   Dental advisory given  Plan Discussed with: CRNA and Anesthesiologist  Anesthesia Plan Comments:        Anesthesia Quick Evaluation

## 2018-07-01 NOTE — Anesthesia Postprocedure Evaluation (Signed)
Anesthesia Post Note  Patient: Patricia [name]  Procedure(s) Performed: LAPAROSCOPIC CHOLECYSTECTOMY WITH INTRAOPERATIVE CHOLANGIOGRAM (N/A Abdomen)     Patient location during evaluation: PACU Anesthesia Type: General Level of consciousness: awake and alert Pain management: pain level controlled Vital Signs Assessment: post-procedure vital signs reviewed and stable Respiratory status: spontaneous breathing, nonlabored ventilation, respiratory function stable and patient connected to nasal cannula oxygen Cardiovascular status: blood pressure returned to baseline and stable Postop Assessment: no apparent nausea or vomiting Anesthetic complications: no    Last Vitals:  Vitals:   07/01/18 1445 07/01/18 1500  BP:    Pulse: 65 69  Resp: 16 18  Temp:    SpO2: 95% 100%    Last Pain:  Vitals:   07/01/18 1140  TempSrc:   PainSc: 0-No pain                 [name]

## 2018-07-01 NOTE — Transfer of Care (Signed)
Immediate Anesthesia Transfer of Care Note  Patient: Patricia [name]  Procedure(s) Performed: LAPAROSCOPIC CHOLECYSTECTOMY WITH INTRAOPERATIVE CHOLANGIOGRAM (N/A Abdomen)  Patient Location: PACU  Anesthesia Type:General  Level of Consciousness: awake, alert , oriented and sedated  Airway & Oxygen Therapy: Patient Spontanous Breathing and Patient connected to nasal cannula oxygen  Post-op Assessment: Report given to RN, Post -op Vital signs reviewed and stable and Patient moving all extremities X 4  Post vital signs: Reviewed and stable  Last Vitals:  Vitals Value Taken Time  BP 185/75 07/01/2018  2:22 PM  Temp    Pulse 69 07/01/2018  2:22 PM  Resp 19 07/01/2018  2:22 PM  SpO2 99 % 07/01/2018  2:22 PM  Vitals shown include unvalidated device data.  Last Pain:  Vitals:   07/01/18 1140  TempSrc:   PainSc: 0-No pain         Complications: No apparent anesthesia complications

## 2018-07-01 NOTE — Anesthesia Procedure Notes (Signed)
Procedure Name: Intubation Date/Time: 07/01/2018 12:52 PM Performed by: Gaylene Brooks, CRNA Pre-anesthesia Checklist: Patient identified, Emergency Drugs available, Suction available and Patient being monitored Patient Re-evaluated:Patient Re-evaluated prior to induction Oxygen Delivery Method: Circle System Utilized Preoxygenation: Pre-oxygenation with 100% oxygen Induction Type: IV induction Ventilation: Mask ventilation without difficulty and Oral airway inserted - appropriate to patient size Laryngoscope Size: Sabra Heck and 2 Grade View: Grade I Tube type: Oral Tube size: 7.0 mm Number of attempts: 1 Airway Equipment and Method: Stylet and Oral airway Placement Confirmation: ETT inserted through vocal cords under direct vision,  positive ETCO2 and breath sounds checked- equal and bilateral Secured at: 21 cm Tube secured with: Tape Dental Injury: Teeth and Oropharynx as per pre-operative assessment

## 2018-07-01 NOTE — H&P (Signed)
Patricia [name]  Location: [facility] Patient #: [id number] DOB: Jan 20, 1932 Married / Language: English / Race: White Female   History of Present Illness The patient is a 84 year old female who presents with abdominal pain. We are asked to see the patient in consultation by Dr. Felipa Eth to evaluate her for gallstones. The patient is a 84 year old white female who is been having right upper quadrant pain for the last couple months. She describes the pain as severe. The pain has not been associated with nausea or vomiting. She had An ultrasound that did show stones in the gallbladder but no gallbladder wall thickening and mild dilation of the common bile duct. Her liver functions were relatively normal.   Allergies Macrobid *URINARY ANTI-INFECTIVES*  Aricept *PSYCHOTHERAPEUTIC AND NEUROLOGICAL AGENTS - MISC.*  Codeine Phosphate *ANALGESICS - OPIOID*  Morphine Sulfate (Concentrate) *ANALGESICS - OPIOID*  Penicillin G Benzathine & Proc *PENICILLINS*  Allergies Reconciled   Medication History  Albuterol Sulfate HFA (108 (90 Base)MCG/ACT Aerosol Soln, Inhalation) Active. Align (4MG  Capsule, Oral) Active. AzaTHIOprine (50MG  Tablet, Oral) Active. Budesonide (90MCG/ACT Inhaler, Inhalation) Active. Cetirizine HCl (10MG  Capsule, Oral) Active. CloNIDine (0.1MG /24HR Patch Weekly, Transdermal) Active. Colesevelam HCl (625MG  Tablet, Oral) Active. CycloSPORINE (100MG  Capsule, Oral) Active. Fenofibrate (48MG  Tablet, Oral) Active. FLUoxetine HCl (20MG  Capsule, Oral) Active. Furosemide (40MG  Tablet, Oral) Active. Hydrocodone-Acetaminophen (5-325MG  Tablet, Oral) Active. Lysine (500MG  Tablet, Oral) Active. Levothyroxine Sodium (75MCG Tablet, Oral) Active. Lutein-Zeaxanthin (25-5MG  Capsule, Oral) Active. Metoprolol Succinate (100MG  CP24 Sprinkle, Oral) Active. Myrbetriq (25MG  Tablet ER 24HR, Oral) Active. Omeprazole (20MG  Capsule DR, Oral) Active. Oxybutynin  Chloride (10MG  Tablet ER, Oral) Active. PredniSONE (5MG  Tablet, Oral) Active. Promethazine HCl (25MG  Tablet, Oral) Active. Resveratrol (100MG  Capsule, Oral) Active. Sulfamethoxazole-Trimethoprim (400-80MG  Tablet, Oral) Active. Warfarin Sodium (2.5MG  Tablet, Oral) Active. Medications Reconciled    Review of Systems  General Not Present- Appetite Loss, Chills, Fatigue, Fever, Night Sweats, Weight Gain and Weight Loss. Note: All other systems negative (unless as noted in HPI & included Review of Systems) Skin Not Present- Change in Wart/Mole, Dryness, Hives, Jaundice, New Lesions, Non-Healing Wounds, Rash and Ulcer. HEENT Not Present- Earache, Hearing Loss, Hoarseness, Nose Bleed, Oral Ulcers, Ringing in the Ears, Seasonal Allergies, Sinus Pain, Sore Throat, Visual Disturbances, Wears glasses/contact lenses and Yellow Eyes. Respiratory Not Present- Bloody sputum, Chronic Cough, Difficulty Breathing, Snoring and Wheezing. Breast Not Present- Breast Mass, Breast Pain, Nipple Discharge and Skin Changes. Cardiovascular Not Present- Chest Pain, Difficulty Breathing Lying Down, Leg Cramps, Palpitations, Rapid Heart Rate, Shortness of Breath and Swelling of Extremities. Gastrointestinal Present- Abdominal Pain. Not Present- Bloating, Bloody Stool, Change in Bowel Habits, Chronic diarrhea, Constipation, Difficulty Swallowing, Excessive gas, Gets full quickly at meals, Hemorrhoids, Indigestion, Nausea, Rectal Pain and Vomiting. Female Genitourinary Not Present- Frequency, Nocturia, Painful Urination, Pelvic Pain and Urgency. Musculoskeletal Not Present- Back Pain, Joint Pain, Joint Stiffness, Muscle Pain, Muscle Weakness and Swelling of Extremities. Neurological Not Present- Decreased Memory, Fainting, Headaches, Numbness, Seizures, Tingling, Tremor, Trouble walking and Weakness. Psychiatric Not Present- Anxiety, Bipolar, Change in Sleep Pattern, Depression, Fearful and Frequent crying. Endocrine  Not Present- Cold Intolerance, Excessive Hunger, Hair Changes, Heat Intolerance, Hot flashes and New Diabetes. Hematology Not Present- Easy Bruising, Excessive bleeding, Gland problems, HIV and Persistent Infections.  Vitals  Weight: 142.25 lb Height: 65in Body Surface Area: 1.71 m Body Mass Index: 23.67 kg/m  Temp.: 98.69F(Oral)  Pulse: 77 (Regular)  BP: 132/84 (Sitting, Left Arm, Standard)       Physical Exam  General Mental Status-Alert. General  Appearance-Consistent with stated age. Hydration-Well hydrated. Voice-Normal.  Head and Neck Head-normocephalic, atraumatic with no lesions or palpable masses. Trachea-midline. Thyroid Gland Characteristics - normal size and consistency.  Eye Eyeball - Bilateral-Extraocular movements intact. Sclera/Conjunctiva - Bilateral-No scleral icterus.  Chest and Lung Exam Chest and lung exam reveals -quiet, even and easy respiratory effort with no use of accessory muscles and on auscultation, normal breath sounds, no adventitious sounds and normal vocal resonance. Inspection Chest Wall - Normal. Back - normal.  Cardiovascular Cardiovascular examination reveals -normal heart sounds, regular rate and rhythm with no murmurs and normal pedal pulses bilaterally.  Abdomen Note: The abdomen is mostly soft and nontender. There is some mild tenderness to palpation in the right upper quadrant. She has a palpable transplant kidney in the right lower quadrant   Neurologic Neurologic evaluation reveals -alert and oriented x 3 with no impairment of recent or remote memory. Mental Status-Normal.  Musculoskeletal Normal Exam - Left-Upper Extremity Strength Normal and Lower Extremity Strength Normal. Normal Exam - Right-Upper Extremity Strength Normal and Lower Extremity Strength Normal.  Lymphatic Head & Neck  General Head & Neck Lymphatics: Bilateral - Description - Normal. Axillary  General  Axillary Region: Bilateral - Description - Normal. Tenderness - Non Tender. Femoral & Inguinal  Generalized Femoral & Inguinal Lymphatics: Bilateral - Description - Normal. Tenderness - Non Tender.    Assessment & Plan  GALLSTONES (K80.20) Impression: The patient appears to have symptomatic gallstones. Because of the risk of further painful episodes and possible pancreatitis setting she would benefit from having her gallbladder removed. Unfortunately she does have some pretty significant comorbid conditions that complicates her situation. She has a renal transplant and is on blood thinners to support posttransplant. I will contact her medical and renal doctors about the possibility of coming off her blood thinner and the risk of surgery. She will think about the surgery and decide whether she would like to have this done or not. I will call her with the opinions of her medical staff and we will proceed accordingly. I have discussed with her in detail the risks and benefits of the operation as well as some of the technical aspects and she understands

## 2018-07-01 NOTE — Interval H&P Note (Signed)
History and Physical Interval Note:  07/01/2018 12:23 PM  Patricia [name]  has presented today for surgery, with the diagnosis of GALLSTONES  The various methods of treatment have been discussed with the patient and family. After consideration of risks, benefits and other options for treatment, the patient has consented to  Procedure(s): LAPAROSCOPIC CHOLECYSTECTOMY WITH INTRAOPERATIVE CHOLANGIOGRAM (N/A) as a surgical intervention .  The patient's history has been reviewed, patient examined, no change in status, stable for surgery.  I have reviewed the patient's chart and labs.  Questions were answered to the patient's satisfaction.     TOTH III,PAUL S

## 2018-07-01 NOTE — Progress Notes (Signed)
Pt's B/P was elevated, paged Dr.Toth, received orders for metoprolol PRN fior systolic >955, Pt did receive x1 dose

## 2018-07-02 ENCOUNTER — Encounter (HOSPITAL_COMMUNITY): Payer: Self-pay | Admitting: General Surgery

## 2018-07-02 ENCOUNTER — Other Ambulatory Visit: Payer: Self-pay

## 2018-07-02 DIAGNOSIS — K8010 Calculus of gallbladder with chronic cholecystitis without obstruction: Secondary | ICD-10-CM | POA: Diagnosis not present

## 2018-07-02 LAB — POCT I-STAT 4, (NA,K, GLUC, HGB,HCT)
Glucose, Bld: 96 mg/dL (ref 70–99)
HEMATOCRIT: 37 % (ref 36.0–46.0)
HEMOGLOBIN: 12.6 g/dL (ref 12.0–15.0)
Potassium: 4 mmol/L (ref 3.5–5.1)
SODIUM: 144 mmol/L (ref 135–145)

## 2018-07-02 LAB — HEMOGLOBIN A1C
Hgb A1c MFr Bld: 5.1 % (ref 4.8–5.6)
Mean Plasma Glucose: 100 mg/dL

## 2018-07-02 MED ORDER — TRAMADOL HCL 50 MG PO TABS: 50.0000 mg | ORAL_TABLET | Freq: Four times a day (QID) | ORAL | 0 refills | Status: DC | PRN

## 2018-07-02 NOTE — Progress Notes (Signed)
1 Day Post-Op   Subjective/Chief Complaint: Complains of soreness. Nurses say she does not look steady on her feet   Objective: Vital signs in last 24 hours: Temp:  [97.5 F (36.4 C)-98.8 F (37.1 C)] 97.9 F (36.6 C) (09/18 0601) Pulse Rate:  [55-69] 55 (09/18 0601) Resp:  [16-24] 16 (09/18 0601) BP: (151-205)/(62-77) 172/62 (09/18 0601) SpO2:  [89 %-100 %] 100 % (09/18 0601) Weight:  [62.1 kg] 62.1 kg (09/17 1102) Last BM Date: 06/30/18  Intake/Output from previous day: 09/17 0701 - 09/18 0700 In: 770.4 [I.V.:720.4; IV Piggyback:50] Out: 50 [Blood:50] Intake/Output this shift: Total I/O In: 14.7 [I.V.:14.7] Out: -   General appearance: alert and cooperative Resp: clear to auscultation bilaterally Cardio: regular rate and rhythm GI: soft, appropriate tenderness  Lab Results:  Recent Labs    07/01/18 1215 07/01/18 1235  WBC 6.1  --   HGB 13.3 12.6  HCT 42.0 37.0  PLT 194  --    BMET Recent Labs    07/01/18 1215 07/01/18 1235  NA 143 144  K 4.2 4.0  CL 110  --   CO2 22  --   GLUCOSE 102* 96  BUN 12  --   CREATININE 1.93*  --   CALCIUM 11.0*  --    PT/INR Recent Labs    07/01/18 1216  LABPROT 13.3  INR 1.02   ABG No results for input(s): PHART, HCO3 in the last 72 hours.  Invalid input(s): PCO2, PO2  Studies/Results: Dg Cholangiogram Operative  Result Date: 07/01/2018 CLINICAL DATA:  LAP CHOLE *38 secs fl time used* EXAM: INTRAOPERATIVE CHOLANGIOGRAM TECHNIQUE: Cholangiographic images from the C-arm fluoroscopic device were submitted for interpretation post-operatively. Please see the procedural report for the amount of contrast and the fluoroscopy time utilized. COMPARISON:  MRCP 06/13/2018 FINDINGS: No persistent filling defects in the common duct. Intrahepatic ducts are incompletely visualized, appearing decompressed centrally. In the distal CBD is a segment of abrupt tapered narrowing with some shouldering at the transition, and a short narrow  channel distally. Contrast passes into the duodenum. : 1. Negative for retained common duct stone. 2. Abnormal narrowing in the distal CBD. No pancreatic lesion was noted on recent MRCP. Can't exclude ampullary or mucosal lesion. Consider ERCP with brushings. Electronically Signed   By: Lucrezia Europe M.D.   On: 07/01/2018 14:01    Anti-infectives: Anti-infectives (From admission, onward)   Start     Dose/Rate Route Frequency Ordered Stop   07/02/18 0900  sulfamethoxazole-trimethoprim (BACTRIM,SEPTRA) 400-80 MG per tablet 1 tablet    Note to Pharmacy:  Monday, Wednesday, and Friday mornings     1 tablet Oral Once per day on Mon Wed Fri 07/01/18 1524     07/01/18 1115  ciprofloxacin (CIPRO) IVPB 400 mg     400 mg 200 mL/hr over 60 Minutes Intravenous On call to O.R. 07/01/18 1109 07/01/18 1300   07/01/18 1105  ciprofloxacin (CIPRO) 400 MG/200ML IVPB    Note to Pharmacy:  [place], [place]   : cabinet override      07/01/18 1105 07/01/18 1300      Assessment/Plan: s/p Procedure(s): LAPAROSCOPIC CHOLECYSTECTOMY WITH INTRAOPERATIVE CHOLANGIOGRAM (N/A) Advance diet. Try fulls since she does not have her teeth PT eval Home when stable and ready  LOS: 0 days    TOTH III,PAUL S 07/02/2018

## 2018-07-02 NOTE — NC FL2 (Signed)
[place] MEDICAID FL2 LEVEL OF CARE SCREENING TOOL     IDENTIFICATION  Patient Name: Patricia [name] Birthdate: 12-Mar-1932 Sex: female Admission Date (Current Location): 07/01/2018  [facility] and [place] Number:  Herbalist and Address:  [facility] [place], [place] [address] [place], [place] [address]      Provider Number: [id number]  Attending Physician Name and Address:  Jovita Kussmaul, MD  Relative Name and Phone Number:  Shera Laubach; [phone]; husband    Current Level of Care: Hospital Recommended Level of Care: [place] Prior Approval Number:    Date Approved/Denied:   PASRR Number: [id number]  Discharge Plan: SNF    Current Diagnoses: Patient Active Problem List   Diagnosis Date Noted  . Cholecystitis with cholelithiasis 07/01/2018  . Postoperative anemia due to acute blood loss 02/15/2016  . Hyperlipidemia 02/15/2016  . Displaced fracture of right femoral neck ([place]) 02/10/2016  . Chronic diastolic CHF (congestive heart failure) ([place]) 02/08/2016  . Closed right hip fracture ([place]) 02/08/2016  . Hip fracture ([place]) 02/08/2016  . Acute diastolic CHF (congestive heart failure) ([place]) 05/27/2015  . Urinary incontinence 05/27/2015  . Escherichia coli urinary tract infection   . Other emphysema ([place])   . Hypokalemia   . Hypomagnesemia   . Sepsis secondary to UTI ([place]) 05/22/2015  . DVT, lower extremity, proximal ([place]) 03/02/2014  . CKD (chronic kidney disease) stage 3, GFR 30-59 ml/min (HCC) 03/02/2014  . Essential hypertension 03/02/2014  . DVT (deep venous thrombosis) ([place]) 03/01/2014  . Acute renal failure superimposed on stage 3 chronic kidney disease ([place]) 03/01/2014  . Gastroenteritis 11/22/2013  . Nausea & vomiting 11/22/2013  . Hypercalcemia 11/22/2013  . Infection due to ESBL-producing Escherichia coli 10/07/2013  . Septicemia due to E. coli ([place]) 10/07/2013  . Urinary tract infection 10/05/2013  . Sepsis  ([place]) 10/05/2013  . History of renal transplant   . Hx of kidney transplant   . Hypertension   . Thyroid disease   . Multiple allergies   . Abnormal gait   . Reflux   . Diverticulitis   . Hypothyroidism   . Depression   . Anxiety   . COPD (chronic obstructive pulmonary disease) ([place])   . Dizziness   . Diabetes type 2, controlled ([place])     Orientation RESPIRATION BLADDER Height & Weight     Self, Place, Situation  Normal Continent, External catheter Weight: 137 lb (62.1 kg) Height:  5\' 5"  (165.1 cm)  BEHAVIORAL SYMPTOMS/MOOD NEUROLOGICAL BOWEL NUTRITION STATUS      Continent Diet(see discharge summary)  AMBULATORY STATUS COMMUNICATION OF NEEDS Skin   Extensive Assist Verbally Surgical wounds(incision on abdomen with surgical skin glue)                       Personal Care Assistance Level of Assistance  Bathing, Feeding, Dressing Bathing Assistance: Maximum assistance Feeding assistance: Independent Dressing Assistance: Maximum assistance     Functional Limitations Info  Sight, Hearing, Speech Sight Info: Adequate Hearing Info: Adequate Speech Info: Adequate    SPECIAL CARE FACTORS FREQUENCY  PT (By licensed PT), OT (By licensed OT)     PT Frequency: 5x week OT Frequency: 5x week            Contractures Contractures Info: Not present    Additional Factors Info  Code Status, Allergies, Psychotropic, Isolation Precautions Code Status Info: Full Code Allergies Info: MACROLIDES AND KETOLIDES, ARICEPT DONEPEZIL HCL, CODEINE, MORPHINE, PENICILLIN  G, PENICILLINS, STATINS, STREPTOMYCIN, TETRACYCLINE, TETRACYCLINES & RELATED  Psychotropic Info: FLUoxetine (PROZAC) capsule 20 mg daily PO; rivaroxaban (XARELTO) tablet 10 mg daily in the evening PO   Isolation Precautions Info: ESBL     Current Medications (07/02/2018):  This is the current hospital active medication list Current Facility-Administered Medications  Medication Dose Route Frequency Provider Last  Rate Last Dose  . acidophilus (RISAQUAD) capsule 1 capsule  1 capsule Oral QPM Autumn Messing III, MD   1 capsule at 07/01/18 1722  . albuterol (PROVENTIL) (2.5 MG/3ML) 0.083% nebulizer solution 2.5 mg  2.5 mg Inhalation Q6H PRN Autumn Messing III, MD      . azaTHIOprine [employer]le[employer] Sk[employer]) tablet 50 mg  50 mg Oral BID Autumn Messing III, MD   50 mg at 07/02/18 1112  . B-complex with vitamin C tablet 1 tablet  1 tablet Oral Daily Autumn Messing III, MD   1 tablet at 07/02/18 1111  . cholecalciferol (VITAMIN D) tablet 2,000 Units  2,000 Units Oral QPM Autumn Messing III, MD   2,000 Units at 07/01/18 1722  . cloNIDine (CATAPRES) tablet 0.1 mg  0.1 mg Oral BID Autumn Messing III, MD   0.1 mg at 07/02/18 1112  . colesevelam [facility]) tablet 1,875 mg  1,875 mg Oral BID WC Autumn Messing III, MD   1,875 mg at 07/02/18 2694  . cycloSPORINE modified (NEORAL) capsule 100 mg  100 mg Oral BID Autumn Messing III, MD   100 mg at 07/02/18 1112  . dextrose 5 % and 0.9 % NaCl with KCl 20 mEq/L infusion   Intravenous Continuous Autumn Messing III, MD 10 mL/hr at 07/02/18 [phone]    . diphenhydrAMINE-zinc acetate (BENADRYL) 2-7.0 % cream 1 application  1 application Topical Daily PRN Autumn Messing III, MD      . famotidine (PEPCID) IVPB 20 mg premix  20 mg Intravenous Q24H Autumn Messing III, MD 100 mL/hr at 07/02/18 [phone]    . fentaNYL (SUBLIMAZE) injection 12.5-25 mcg  12.5-25 mcg Intravenous Q1H PRN Autumn Messing III, MD      . ferrous sulfate tablet 325 mg  325 mg Oral Q breakfast Autumn Messing III, MD   325 mg at 07/02/18 9381  . FLUoxetine (PROZAC) capsule 20 mg  20 mg Oral Daily Autumn Messing III, MD   20 mg at 07/02/18 1112  . fluticasone (FLONASE) 50 MCG/ACT nasal spray 1 spray  1 spray Each Nare Daily PRN Autumn Messing III, MD      . furosemide (LASIX) tablet 80 mg  80 mg Oral Daily Hammons, Kimberly B, RPH   80 mg at 07/02/18 8299   And  . furosemide (LASIX) tablet 40 mg  40 mg Oral Daily Hammons, Kimberly B, RPH   40 mg at 07/01/18 1723  . levothyroxine (SYNTHROID,  LEVOTHROID) tablet 75 mcg  75 mcg Oral QAC breakfast Autumn Messing III, MD   75 mcg at 07/02/18 3716  . loratadine (CLARITIN) tablet 10 mg  10 mg Oral Daily Autumn Messing III, MD   10 mg at 07/02/18 1112  . metoprolol succinate (TOPROL-XL) 24 hr tablet 100 mg  100 mg Oral QPM Autumn Messing III, MD   100 mg at 07/01/18 1722  . metoprolol tartrate (LOPRESSOR) injection 5 mg  5 mg Intravenous Q6H PRN Autumn Messing III, MD   5 mg at 07/01/18 1640  . mirabegron ER (MYRBETRIQ) tablet 25 mg  25 mg Oral QPM Autumn Messing III, MD   25 mg at 07/01/18 1722  .  mometasone-formoterol (DULERA) 200-5 MCG/ACT inhaler 2 puff  2 puff Inhalation BID Autumn Messing III, MD   2 puff at 07/02/18 1010  . multivitamin with minerals tablet 1 tablet  1 tablet Oral QPM Autumn Messing III, MD   1 tablet at 07/01/18 1722  . ondansetron (ZOFRAN-ODT) disintegrating tablet 4 mg  4 mg Oral Q6H PRN Autumn Messing III, MD       Or  . ondansetron [facility]) injection 4 mg  4 mg Intravenous Q6H PRN Autumn Messing III, MD      . oxybutynin (DITROPAN-XL) 24 hr tablet 10 mg  10 mg Oral Daily Autumn Messing III, MD   10 mg at 07/02/18 1113  . pantoprazole (PROTONIX) EC tablet 40 mg  40 mg Oral Daily Autumn Messing III, MD   40 mg at 07/02/18 1112  . phenylephrine-shark liver oil-mineral oil-petrolatum (PREPARATION H) rectal ointment 1 application  1 application Rectal BID PRN Autumn Messing III, MD      . predniSONE (DELTASONE) tablet 5 mg  5 mg Oral Q breakfast Autumn Messing III, MD   5 mg at 07/02/18 5643  . promethazine (PHENERGAN) tablet 25 mg  25 mg Oral Q6H PRN Autumn Messing III, MD      . rivaroxaban [facility]) tablet 10 mg  10 mg Oral QPM Autumn Messing III, MD      . sulfamethoxazole-trimethoprim (BACTRIM,SEPTRA) 400-80 MG per tablet 1 tablet  1 tablet Oral Once per day on Mon Wed Fri Autumn Messing III, MD   1 tablet at 07/02/18 [phone]  . traMADol (ULTRAM) tablet 50 mg  50 mg Oral Q6H PRN Autumn Messing III, MD   50 mg at 07/02/18 0839  . zinc sulfate capsule 220 mg  220 mg Oral QPM Autumn Messing  III, MD   220 mg at 07/01/18 1722     Discharge Medications: Please see discharge summary for a list of discharge medications.  Relevant Imaging Results:  Relevant Lab Results:   Additional Information [id number] [place], [place]

## 2018-07-02 NOTE — Plan of Care (Signed)
?  Problem: Nutrition: ?Goal: Adequate nutrition will be maintained ?Outcome: Progressing ?  ?Problem: Coping: ?Goal: Level of anxiety will decrease ?Outcome: Progressing ?  ?Problem: Elimination: ?Goal: Will not experience complications related to urinary retention ?Outcome: Progressing ?  ?

## 2018-07-02 NOTE — Progress Notes (Signed)
Pt had not urinated during shift, bladder scan completed and 500 in bladder.  Paged [facility] and received orders from Dr. Dema Severin for In and cath, and placement of foley post In and out cath if pt does not urinate after 6 hours.

## 2018-07-02 NOTE — Evaluation (Signed)
Physical Therapy Evaluation Patient Details Name: Patricia [name] MRN: [id number] DOB: 1931-12-14 Today's Date: 07/02/2018   History of Present Illness  Pt is an 84 y/o female s/p laparascopic cholecystectomy on 9/17. PMH includes CAD, PVD, CHF, DVT, and COPD.   Clinical Impression  Pt is s/p surgery above with deficits below. Pt withconfusion and slow processing, however, unsure of pt's baseline. Pt with R shoulder pain and unable to push up to standing from sitting with use of RW. PT stood in front of pt to help pt stand and pt requiring mod A, and also demonstrated posterior lean. Feel pt is at increased risk for falls and will need increased assist at d/c. Will continue to follow acutely to maximize functional mobility independence and safety.     Follow Up Recommendations SNF;Supervision/Assistance - 24 hour    Equipment Recommendations  None recommended by PT    Recommendations for Other Services       Precautions / Restrictions Precautions Precautions: Fall Restrictions Weight Bearing Restrictions: No      Mobility  Bed Mobility               General bed mobility comments: In chair upon entry.   Transfers Overall transfer level: Needs assistance Equipment used: Rolling walker (2 wheeled);1 person hand held assist Transfers: Sit to/from Stand Sit to Stand: Mod assist         General transfer comment: Attempted to stand with use of RW, however, pt with increased pain in R shoulder when pushing up from chair. PT stood in front of pt on second attempt and required mod A for steadying and lift assist. Pt with posterior lean and unsafe to attempt further mobility without 2nd person to assist.    Ambulation/Gait                Stairs            Wheelchair Mobility    Modified Rankin (Stroke Patients Only)       Balance Overall balance assessment: Needs assistance Sitting-balance support: No upper extremity supported;Feet supported Sitting  balance-Leahy Scale: Fair   Postural control: Posterior lean Standing balance support: Bilateral upper extremity supported;During functional activity Standing balance-Leahy Scale: Poor Standing balance comment: Posterior lean in standing and mod A to maintain balance.                              Pertinent Vitals/Pain Pain Assessment: Faces Faces Pain Scale: Hurts little more Pain Location: abdomen  Pain Descriptors / Indicators: Aching;Operative site guarding Pain Intervention(s): Limited activity within patient's tolerance;Monitored during session;Repositioned    Home Living Family/patient expects to be discharged to:: Private residence Living Arrangements: Spouse/significant other Available Help at Discharge: Family;Available PRN/intermittently Type of Home: House Home Access: Stairs to enter Entrance Stairs-Rails: Right;Left Entrance Stairs-Number of Steps: 4 Home Layout: Two level;Able to live on main level with bedroom/bathroom Home Equipment: Gilford Rile - 2 wheels;Wheelchair - manual;Shower seat      Prior Function Level of Independence: Needs assistance   Gait / Transfers Assistance Needed: Uses RW for ambulation within the house. Uses WC for outdoor mobility. Husband assists with stair navigation   ADL's / Homemaking Assistance Needed: Husband assists with ADLs         Hand Dominance   Dominant Hand: Right    Extremity/Trunk Assessment   Upper Extremity Assessment Upper Extremity Assessment: Defer to OT evaluation;RUE deficits/detail RUE Deficits / Details: R shoulder  pain which limited ROM to shoulder height. Pt with inability to push up from chair with RUE secondary to pain.     Lower Extremity Assessment Lower Extremity Assessment: Generalized weakness    Cervical / Trunk Assessment Cervical / Trunk Assessment: Kyphotic  Communication   Communication: No difficulties  Cognition Arousal/Alertness: Awake/alert Behavior During Therapy: WFL for  tasks assessed/performed Overall Cognitive Status: Impaired/Different from baseline Area of Impairment: Orientation;Memory;Problem solving;Following commands                 Orientation Level: Disoriented to;Time(did not know year of birth )   Memory: Decreased short-term memory;Decreased recall of precautions Following Commands: Follows one step commands with increased time     Problem Solving: Slow processing;Requires verbal cues General Comments: No family present in session. Reports husband was bringing her pain medicine, however, husband not present. Educated to call nurse for pain medications. Pt did not remember year of birth.       General Comments General comments (skin integrity, edema, etc.): No family present     Exercises     Assessment/Plan    PT Assessment Patient needs continued PT services  PT Problem List Decreased strength;Decreased balance;Decreased activity tolerance;Decreased range of motion;Decreased mobility;Decreased cognition;Decreased knowledge of use of DME;Decreased safety awareness;Decreased knowledge of precautions;Pain       PT Treatment Interventions DME instruction;Gait training;Functional mobility training;Therapeutic activities;Therapeutic exercise;Balance training;Stair training;Patient/family education;Cognitive remediation    PT Goals (Current goals can be found in the Care Plan section)  Acute Rehab PT Goals Patient Stated Goal: "for my shoulder to feel better"  PT Goal Formulation: With patient Time For Goal Achievement: 07/16/18 Potential to Achieve Goals: Fair    Frequency Min 2X/week   Barriers to discharge        Co-evaluation               AM-PAC PT "6 Clicks" Daily Activity  Outcome Measure Difficulty turning over in bed (including adjusting bedclothes, sheets and blankets)?: A Lot Difficulty moving from lying on back to sitting on the side of the bed? : Unable Difficulty sitting down on and standing up from a  chair with arms (e.g., wheelchair, bedside commode, etc,.)?: Unable Help needed moving to and from a bed to chair (including a wheelchair)?: A Lot Help needed walking in hospital room?: Total Help needed climbing 3-5 steps with a railing? : Total 6 Click Score: 8    End of Session Equipment Utilized During Treatment: Gait belt Activity Tolerance: Patient limited by pain Patient left: in chair;with call bell/phone within reach;with chair alarm set Nurse Communication: Mobility status PT Visit Diagnosis: Unsteadiness on feet (R26.81);Muscle weakness (generalized) (M62.81);Pain Pain - Right/Left: Right Pain - part of body: Shoulder(abdomen )    Time: 7939-0300 PT Time Calculation (min) (ACUTE ONLY): 23 min   Charges:   PT Evaluation $PT Eval Moderate Complexity: 1 Mod PT Treatments $Therapeutic Activity: 8-22 mins        Leighton Ruff, PT, DPT  Acute Rehabilitation Services  Pager: [phone] Office: [phone]   Rudean Hitt 07/02/2018, 11:09 AM

## 2018-07-02 NOTE — Discharge Instructions (Signed)
.  rxav

## 2018-07-03 DIAGNOSIS — K8010 Calculus of gallbladder with chronic cholecystitis without obstruction: Secondary | ICD-10-CM | POA: Diagnosis not present

## 2018-07-03 MED ORDER — FAMOTIDINE 20 MG PO TABS
20.0000 mg | ORAL_TABLET | Freq: Every day | ORAL | Status: DC
  Administered 2018-07-03: 20 mg via ORAL
  Filled 2018-07-03: qty 1

## 2018-07-03 NOTE — Progress Notes (Signed)
Physical Therapy Treatment Patient Details Name: Patricia [name] MRN: [id number] DOB: 1932/10/06 Today's Date: 07/03/2018    History of Present Illness Pt is an 84 y/o female s/p laparascopic cholecystectomy on 9/17. PMH includes CAD, PVD, CHF, DVT, and COPD.     PT Comments    Pt progressing toward PT goals, incr gait and activity tolerance this session; continue PT POC   Follow Up Recommendations  SNF;Supervision/Assistance - 24 hour     Equipment Recommendations  None recommended by PT    Recommendations for Other Services       Precautions / Restrictions Precautions Precautions: Fall Restrictions Weight Bearing Restrictions: No    Mobility  Bed Mobility Overal bed mobility: Needs Assistance Bed Mobility: Rolling;Sit to Supine Rolling: Min assist;Min guard     Sit to supine: Mod assist   General bed mobility comments: for LE management  Transfers Overall transfer level: Needs assistance Equipment used: Rolling walker (2 wheeled) Transfers: Sit to/from [employer] Transfers Sit to Stand: Min assist;+2 physical assistance;+2 safety/equipment Stand pivot transfers: Min assist;+2 safety/equipment       General transfer comment: increased time/effort; VCs for safe hand placement with assist to rise and steady at RW; pt completed x2 sit<>stands with decreased assist required during second trial  Ambulation/Gait Ambulation/Gait assistance: Min assist;+2 safety/equipment(second person for chair d/t pt rapid fatigue) Gait Distance (Feet): 35 Feet Assistive device: Rolling walker (2 wheeled) Gait Pattern/deviations: Step-through pattern;Decreased stride length;Trunk flexed     General Gait Details: cues for RW position, safety, trunk and hip extension; assist to maneuver RW, fatigues quickly   Stairs             Wheelchair Mobility    Modified Rankin (Stroke Patients Only)       Balance Overall balance assessment: Needs  assistance Sitting-balance support: No upper extremity supported;Feet supported Sitting balance-Leahy Scale: Fair     Standing balance support: Bilateral upper extremity supported;During functional activity Standing balance-Leahy Scale: Poor Standing balance comment: reliant on UE support/external support from therapist                            Cognition Arousal/Alertness: Awake/alert Behavior During Therapy: WFL for tasks assessed/performed Overall Cognitive Status: Impaired/Different from baseline Area of Impairment: Memory;Problem solving;Following commands                     Memory: Decreased short-term memory Following Commands: Follows one step commands with increased time     Problem Solving: Slow processing;Requires verbal cues;Requires tactile cues General Comments: pt slow to process instructions at times during session; requires multimodal cues (very confused this am per nursing staff--Ox3 during PT/OT session)      Exercises      General Comments        Pertinent Vitals/Pain Pain Assessment: Faces Faces Pain Scale: Hurts even more Pain Location: R side of abdominal region; R shoulder Pain Descriptors / Indicators: Aching;Operative site guarding;Grimacing Pain Intervention(s): Limited activity within patient's tolerance;Monitored during session;Repositioned    Home Living Family/patient expects to be discharged to:: Private residence Living Arrangements: Spouse/significant other Available Help at Discharge: Family Type of Home: House Home Access: Stairs to enter Entrance Stairs-Rails: Right;Left Home Layout: Two level;Able to live on main level with bedroom/bathroom Home Equipment: Gilford Rile - 2 wheels;Wheelchair - manual;Shower seat      Prior Function Level of Independence: Needs assistance  Gait / Transfers Assistance Needed: Uses RW for ambulation within the  house. Uses WC for outdoor mobility. Husband assists with stair navigation   ADL's / Homemaking Assistance Needed: Husband assists with ADLs      PT Goals (current goals can now be found in the care plan section) Acute Rehab PT Goals Patient Stated Goal: less pain    Frequency    Min 2X/week      PT Plan Current plan remains appropriate    Co-evaluation PT/OT/SLP Co-Evaluation/Treatment: Yes Reason for Co-Treatment: For patient/therapist safety PT goals addressed during session: Mobility/safety with mobility;Proper use of DME OT goals addressed during session: ADL's and self-care;Proper use of Adaptive equipment and DME      AM-PAC PT "6 Clicks" Daily Activity  Outcome Measure  Difficulty turning over in bed (including adjusting bedclothes, sheets and blankets)?: A Lot Difficulty moving from lying on back to sitting on the side of the bed? : Unable Difficulty sitting down on and standing up from a chair with arms (e.g., wheelchair, bedside commode, etc,.)?: Unable Help needed moving to and from a bed to chair (including a wheelchair)?: A Little Help needed walking in hospital room?: A Lot Help needed climbing 3-5 steps with a railing? : A Lot 6 Click Score: 11    End of Session Equipment Utilized During Treatment: Gait belt Activity Tolerance: Patient tolerated treatment well;Patient limited by fatigue Patient left: in bed;with call bell/phone within reach;with bed alarm set;with family/visitor present   PT Visit Diagnosis: Unsteadiness on feet (R26.81);Muscle weakness (generalized) (M62.81);Pain Pain - Right/Left: Right Pain - part of body: Shoulder     Time: 1123-1150 PT Time Calculation (min) (ACUTE ONLY): 27 min  Charges:  $Gait Training: 8-22 mins                     Kenyon Ana, PT Pager: [phone] 07/03/2018   [facility] Acute Rehab Dept [phone]    [facility] 07/03/2018, 2:32 PM

## 2018-07-03 NOTE — Progress Notes (Signed)
2 Days Post-Op   Subjective/Chief Complaint: No complaiints. Slightly confused   Objective: Vital signs in last 24 hours: Temp:  [97.5 F (36.4 C)-97.8 F (36.6 C)] 97.8 F (36.6 C) (09/19 0542) Pulse Rate:  [53-64] 64 (09/19 0833) Resp:  [16-18] 18 (09/19 0833) BP: (124-140)/(49-116) 135/60 (09/19 0542) SpO2:  [96 %-100 %] 96 % (09/19 0833) Last BM Date: 06/30/18  Intake/Output from previous day: 09/18 0701 - 09/19 0700 In: 257.1 [I.V.:207.1; IV Piggyback:50] Out: 500 [Urine:500] Intake/Output this shift: Total I/O In: -  Out: 150 [Urine:150]  General appearance: alert and cooperative Resp: clear to auscultation bilaterally Cardio: regular rate and rhythm GI: soft, minimal tenderness. incisions look good  Lab Results:  Recent Labs    07/01/18 1215 07/01/18 1235  WBC 6.1  --   HGB 13.3 12.6  HCT 42.0 37.0  PLT 194  --    BMET Recent Labs    07/01/18 1215 07/01/18 1235  NA 143 144  K 4.2 4.0  CL 110  --   CO2 22  --   GLUCOSE 102* 96  BUN 12  --   CREATININE 1.93*  --   CALCIUM 11.0*  --    PT/INR Recent Labs    07/01/18 1216  LABPROT 13.3  INR 1.02   ABG No results for input(s): PHART, HCO3 in the last 72 hours.  Invalid input(s): PCO2, PO2  Studies/Results: Dg Cholangiogram Operative  Result Date: 07/01/2018 CLINICAL DATA:  LAP CHOLE *38 secs fl time used* EXAM: INTRAOPERATIVE CHOLANGIOGRAM TECHNIQUE: Cholangiographic images from the C-arm fluoroscopic device were submitted for interpretation post-operatively. Please see the procedural report for the amount of contrast and the fluoroscopy time utilized. COMPARISON:  MRCP 06/13/2018 FINDINGS: No persistent filling defects in the common duct. Intrahepatic ducts are incompletely visualized, appearing decompressed centrally. In the distal CBD is a segment of abrupt tapered narrowing with some shouldering at the transition, and a short narrow channel distally. Contrast passes into the duodenum. : 1.  Negative for retained common duct stone. 2. Abnormal narrowing in the distal CBD. No pancreatic lesion was noted on recent MRCP. Can't exclude ampullary or mucosal lesion. Consider ERCP with brushings. Electronically Signed   By: Lucrezia Europe M.D.   On: 07/01/2018 14:01    Anti-infectives: Anti-infectives (From admission, onward)   Start     Dose/Rate Route Frequency Ordered Stop   07/02/18 0900  sulfamethoxazole-trimethoprim (BACTRIM,SEPTRA) 400-80 MG per tablet 1 tablet    Note to Pharmacy:  Monday, Wednesday, and Friday mornings     1 tablet Oral Once per day on Mon Wed Fri 07/01/18 1524     07/01/18 1115  ciprofloxacin (CIPRO) IVPB 400 mg     400 mg 200 mL/hr over 60 Minutes Intravenous On call to O.R. 07/01/18 1109 07/01/18 1300   07/01/18 1105  ciprofloxacin (CIPRO) 400 MG/200ML IVPB    Note to Pharmacy:  [place] Lewis, Lindsi   : cabinet override      07/01/18 1105 07/01/18 1300      Assessment/Plan: s/p Procedure(s): LAPAROSCOPIC CHOLECYSTECTOMY WITH INTRAOPERATIVE CHOLANGIOGRAM (N/A) Advance diet  SNF placement Renal tx. Continue antirejection meds  LOS: 0 days    TOTH III,Michaelanthony Kempton S 07/03/2018

## 2018-07-03 NOTE — Progress Notes (Signed)
Occupational Therapy Evaluation Patient Details Name: Patricia [name] MRN: [id number] DOB: December 03, 1931 Today's Date: 07/03/2018    History of Present Illness Pt is an 84 y/o female s/p laparascopic cholecystectomy on 9/17. PMH includes CAD, PVD, CHF, DVT, and COPD.    Clinical Impression   This 84 y/o female presents with the above. At baseline pt uses RW for functional mobility within the home, spouse assists with ADL completion. Pt limited mostly due to pain and generalized weakness this session. Pt completing functional mobility with minA+2 using RW this session. She currently requires minA for UB ADL, mod-maxA for LB ADLs secondary to abdominal and RUE pain limiting her functional performance. Pt will benefit from continued acute OT services; recommend follow up therapy services in SNF setting after discharge to maximize her overall safety and independence with ADLs and mobility prior to return home. Will follow.     Follow Up Recommendations  SNF;Supervision/Assistance - 24 hour    Equipment Recommendations  3 in 1 bedside commode;Other (comment)(TBD in next venue)           Precautions / Restrictions Precautions Precautions: Fall Restrictions Weight Bearing Restrictions: No      Mobility Bed Mobility Overal bed mobility: Needs Assistance Bed Mobility: Sit to Supine       Sit to supine: Mod assist   General bed mobility comments: for LE management  Transfers Overall transfer level: Needs assistance Equipment used: Rolling walker (2 wheeled) Transfers: Sit to/from Stand Sit to Stand: Min assist;+2 physical assistance;+2 safety/equipment         General transfer comment: increased time/effort; VCs for safe hand placement with assist to rise and steady at RW; pt completed x2 sit<>stands with decreased assist required during second trial    Balance Overall balance assessment: Needs assistance Sitting-balance support: No upper extremity supported;Feet  supported Sitting balance-Leahy Scale: Fair     Standing balance support: Bilateral upper extremity supported;During functional activity Standing balance-Leahy Scale: Poor Standing balance comment: reliant on UE support/external support from therapist                           ADL either performed or assessed with clinical judgement   ADL Overall ADL's : Needs assistance/impaired Eating/Feeding: Independent;Sitting   Grooming: Set up;Sitting;Wash/dry face   Upper Body Bathing: Minimal assistance;Sitting   Lower Body Bathing: Moderate assistance;Sit to/from stand   Upper Body Dressing : Minimal assistance;Sitting   Lower Body Dressing: Sit to/from stand;Maximal assistance Lower Body Dressing Details (indicate cue type and reason): pt is able to perform figure 4 technique,but not quite able to reach forward to access feet; minA for standing balance Toilet Transfer: Minimal assistance;+2 for safety/equipment;Ambulation;BSC Toilet Transfer Details (indicate cue type and reason): simulated with room level mobility Toileting- Clothing Manipulation and Hygiene: +2 for safety/equipment;Moderate assistance;Sit to/from stand       Functional mobility during ADLs: Minimal assistance;+2 for physical assistance;+2 for safety/equipment;Rolling walker General ADL Comments: pt mostly limited in functional performance and mobility due to pain      Vision Baseline Vision/History: Wears glasses Wears Glasses: At all times Vision Assessment?: No apparent visual deficits                Pertinent Vitals/Pain Pain Assessment: Faces Faces Pain Scale: Hurts even more Pain Location: R side of abdominal region; R shoulder Pain Descriptors / Indicators: Aching;Operative site guarding;Grimacing Pain Intervention(s): Limited activity within patient's tolerance;Monitored during session;Repositioned     Hand Dominance Right  Extremity/Trunk Assessment Upper Extremity  Assessment Upper Extremity Assessment: RUE deficits/detail RUE Deficits / Details: R shoulder pain which limited ROM to shoulder height.   Lower Extremity Assessment Lower Extremity Assessment: Defer to PT evaluation   Cervical / Trunk Assessment Cervical / Trunk Assessment: Kyphotic   Communication Communication Communication: No difficulties   Cognition Arousal/Alertness: Awake/alert Behavior During Therapy: WFL for tasks assessed/performed Overall Cognitive Status: Impaired/Different from baseline Area of Impairment: Memory;Problem solving;Following commands                     Memory: Decreased short-term memory Following Commands: Follows one step commands with increased time     Problem Solving: Slow processing;Requires verbal cues;Requires tactile cues General Comments: pt slow to process instructions at times during session; requires multimodal cues   General Comments       Exercises     Shoulder Instructions      Home Living Family/patient expects to be discharged to:: Private residence Living Arrangements: Spouse/significant other Available Help at Discharge: Family Type of Home: House Home Access: Stairs to enter [employer] of Steps: 4 Entrance Stairs-Rails: Right;Left Home Layout: Two level;Able to live on main level with bedroom/bathroom     Bathroom Shower/Tub: Walk-in shower;Tub/shower unit   Bathroom Toilet: Handicapped height     Home Equipment: Environmental consultant - 2 wheels;Wheelchair - manual;Shower seat          Prior Functioning/Environment Level of Independence: Needs assistance  Gait / Transfers Assistance Needed: Uses RW for ambulation within the house. Uses WC for outdoor mobility. Husband assists with stair navigation  ADL's / Homemaking Assistance Needed: Husband assists with ADLs             OT Problem List: Decreased strength;Decreased activity tolerance;Impaired balance (sitting and/or standing);Pain      OT  Treatment/Interventions: Self-care/ADL training;Therapeutic exercise;Therapeutic activities;Patient/family education;Balance training;Cognitive remediation/compensation    OT Goals(Current goals can be found in the care plan section) Acute Rehab OT Goals Patient Stated Goal: less pain OT Goal Formulation: With patient Time For Goal Achievement: 07/17/18 Potential to Achieve Goals: Good  OT Frequency: Min 2X/week   Barriers to D/C:            Co-evaluation PT/OT/SLP Co-Evaluation/Treatment: Yes Reason for Co-Treatment: For patient/therapist safety;To address functional/ADL transfers   OT goals addressed during session: ADL's and self-care;Proper use of Adaptive equipment and DME      AM-PAC PT "6 Clicks" Daily Activity     Outcome Measure Help from another person eating meals?: None Help from another person taking care of personal grooming?: A Little Help from another person toileting, which includes using toliet, bedpan, or urinal?: A Little Help from another person bathing (including washing, rinsing, drying)?: A Lot Help from another person to put on and taking off regular upper body clothing?: A Little Help from another person to put on and taking off regular lower body clothing?: A Lot 6 Click Score: 17   End of Session Equipment Utilized During Treatment: Gait belt;Rolling walker Nurse Communication: Mobility status  Activity Tolerance: Patient tolerated treatment well Patient left: in bed;with call bell/phone within reach;with bed alarm set;with family/visitor present  OT Visit Diagnosis: Muscle weakness (generalized) (M62.81);Pain Pain - Right/Left: Right Pain - part of body: Shoulder(abdomen)                Time: 1121-1150 OT Time Calculation (min): 29 min Charges:  OT General Charges $OT Visit: 1 Visit OT Evaluation $OT Eval Moderate Complexity: 1 Mod  Patricia [name], OT [facility] Pager [phone] Office  [phone]   Patricia [name] 07/03/2018, 1:37 PM

## 2018-07-03 NOTE — Plan of Care (Signed)
  Problem: Pain Managment: Goal: General experience of comfort will improve Outcome: Progressing   Problem: Skin Integrity: Goal: Risk for impaired skin integrity will decrease Outcome: Progressing   

## 2018-07-03 NOTE — Social Work (Signed)
CSW initiated authorization for SNF discharge to [facility]. Await authorization to arrange discharge and transfer.   [place], [place] Work [phone]

## 2018-07-03 NOTE — Clinical Social Work Note (Signed)
Clinical Social Work Assessment  Patient Details  Name: Patricia [name] MRN: [id number] Date of Birth: 07-20-1932  Date of referral:  07/03/18               Reason for consult:  Facility Placement, Discharge Planning                Permission sought to share information with:  Family Supports, Customer service manager Permission granted to share information::  No(pt disoriented to situation)  Name::     [name]  Agency::  [facility]  Relationship::  husband  Sport and exercise psychologist Information:     Housing/Transportation Living arrangements for the past 2 months:  Single Family Home Source of Information:  Spouse Patient Interpreter Needed:  None Criminal Activity/Legal Involvement Pertinent to Current Situation/Hospitalization:  No - Comment as needed Significant Relationships:  Adult Children, Other Family Members, Spouse Lives with:  Spouse Do you feel safe going back to the place where you live?  Yes Need for family participation in patient care:  Yes (Comment)  Care giving concerns:  Pt having generalized weakness and requiring SNF level therapies prior to returning home with spouse.    Social Worker assessment / plan:CSW spoke with pt husband via telephone, although pt oriented x 3 she is disoriented to situation. Pt husband states interest and agreement with recommendations for pt to discharge to SNF. Pt husband cannot provide level of care currently needed. Pt husband states interest in [facility] (this facility has offered a bed). Pt husband states understanding that when pt receives authorization she will likely d/c to [facility].   Employment status:  Retired Nurse, adult PT Recommendations:  [place], [place] / Referral to community resources:  [place]  Patient/Family's Response to care:  Pt husband amenable to SNF, requests [facility].   Patient/Family's Understanding of and Emotional  Response to Diagnosis, Current Treatment, and Prognosis:  Pt husband sounds tired via telephone but states understanding of pt current diagnosis, treatment and prognosis. Pt husband emotionally appropriate and expresses good goals for pt to receive therapies before returning home with him. Pt husband states he is happy with care being received here at hospital.   Emotional Assessment Appearance:  Appears stated age Attitude/Demeanor/Rapport:  Unable to Assess Affect (typically observed):  Unable to Assess Orientation:  Oriented to Self, Oriented to Place, Oriented to  Time Alcohol / Substance use:  Not Applicable Psych involvement (Current and /or in the community):  No (Comment)  Discharge Needs  Concerns to be addressed:  Care Coordination Readmission within the last 30 days:  No Current discharge risk:  Dependent with Mobility, Cognitively Impaired Barriers to Discharge:  Ship broker, Continued Medical Work up   [employer], [place] 07/03/2018, 5:47 PM

## 2018-07-03 NOTE — Progress Notes (Signed)
Patient voided in bedside commode. Urine output 100 mL.

## 2018-07-04 ENCOUNTER — Ambulatory Visit (HOSPITAL_COMMUNITY): Payer: Medicare Other

## 2018-07-04 DIAGNOSIS — I110 Hypertensive heart disease with heart failure: Secondary | ICD-10-CM | POA: Diagnosis not present

## 2018-07-04 DIAGNOSIS — K8010 Calculus of gallbladder with chronic cholecystitis without obstruction: Secondary | ICD-10-CM | POA: Diagnosis not present

## 2018-07-04 DIAGNOSIS — J449 Chronic obstructive pulmonary disease, unspecified: Secondary | ICD-10-CM | POA: Diagnosis not present

## 2018-07-04 DIAGNOSIS — I509 Heart failure, unspecified: Secondary | ICD-10-CM | POA: Diagnosis not present

## 2018-07-04 LAB — CBC WITH DIFFERENTIAL/PLATELET
Abs Immature Granulocytes: 0 10*3/uL (ref 0.0–0.1)
Basophils Absolute: 0 10*3/uL (ref 0.0–0.1)
Basophils Relative: 0 %
EOS PCT: 2 %
Eosinophils Absolute: 0.1 10*3/uL (ref 0.0–0.7)
HEMATOCRIT: 35.1 % — AB (ref 36.0–46.0)
HEMOGLOBIN: 11.2 g/dL — AB (ref 12.0–15.0)
IMMATURE GRANULOCYTES: 0 %
LYMPHS ABS: 1.8 10*3/uL (ref 0.7–4.0)
LYMPHS PCT: 22 %
MCH: 31.3 pg (ref 26.0–34.0)
MCHC: 31.9 g/dL (ref 30.0–36.0)
MCV: 98 fL (ref 78.0–100.0)
MONOS PCT: 9 %
Monocytes Absolute: 0.7 10*3/uL (ref 0.1–1.0)
Neutro Abs: 5.5 10*3/uL (ref 1.7–7.7)
Neutrophils Relative %: 67 %
Platelets: 170 10*3/uL (ref 150–400)
RBC: 3.58 MIL/uL — AB (ref 3.87–5.11)
RDW: 13.8 % (ref 11.5–15.5)
WBC: 8.3 10*3/uL (ref 4.0–10.5)

## 2018-07-04 LAB — COMPREHENSIVE METABOLIC PANEL
ALBUMIN: 2.6 g/dL — AB (ref 3.5–5.0)
ALT: 13 U/L (ref 0–44)
AST: 23 U/L (ref 15–41)
Alkaline Phosphatase: 49 U/L (ref 38–126)
Anion gap: 9 (ref 5–15)
BUN: 18 mg/dL (ref 8–23)
CHLORIDE: 113 mmol/L — AB (ref 98–111)
CO2: 18 mmol/L — AB (ref 22–32)
CREATININE: 1.66 mg/dL — AB (ref 0.44–1.00)
Calcium: 10.1 mg/dL (ref 8.9–10.3)
GFR calc Af Amer: 32 mL/min — ABNORMAL LOW (ref >60)
GFR, EST NON AFRICAN AMERICAN: 27 mL/min — AB (ref >60)
Glucose, Bld: 121 mg/dL — ABNORMAL HIGH (ref 70–99)
POTASSIUM: 3.7 mmol/L (ref 3.5–5.1)
SODIUM: 140 mmol/L (ref 135–145)
Total Bilirubin: 0.9 mg/dL (ref 0.3–1.2)
Total Protein: 6 g/dL — ABNORMAL LOW (ref 6.5–8.1)

## 2018-07-04 LAB — TROPONIN I: Troponin I: 0.04 ng/mL (ref <0.03)

## 2018-07-04 MED ORDER — POLYETHYLENE GLYCOL (MIRALAX) NICU SYRINGE 0.34GM/ML: 17.0000 g | Freq: Once | ORAL | Status: DC

## 2018-07-04 MED ORDER — TRAMADOL HCL 50 MG PO TABS: 50.0000 mg | ORAL_TABLET | Freq: Four times a day (QID) | ORAL | 0 refills | Status: DC | PRN

## 2018-07-04 MED ORDER — POLYETHYLENE GLYCOL 3350 17 G PO PACK
17.0000 g | PACK | Freq: Once | ORAL | Status: AC
  Administered 2018-07-04: 17 g via ORAL
  Filled 2018-07-04: qty 1

## 2018-07-04 NOTE — Discharge Summary (Signed)
Physician Discharge Summary  Patient ID: Patricia [name] MRN: [id number] DOB/AGE: 1932/06/15 85 y.o.  Admit date: 07/01/2018 Discharge date: 07/04/2018  Admission Diagnoses:  Discharge Diagnoses:  Active Problems:   Cholecystitis with cholelithiasis   Discharged Condition: fair  Hospital Course: the patient underwent lap chole and did well. Postoperatively she was found to be fairly debilitated and confused and was probably this way at home. She could not return home and take care of herself so arrangements were made for placement in a skilled nursing facility. Kidney function is good. She has some new right sided pain today but no obvious source  Consults: None  Significant Diagnostic Studies: none  Treatments: surgery: as above  Discharge Exam: Blood pressure (!) 151/48, pulse 62, temperature 97.8 F (36.6 C), temperature source Oral, resp. rate 20, height 5\' 5"  (1.651 m), weight 62.1 kg, SpO2 98 %. General appearance: alert, cooperative and confused Resp: clear to auscultation bilaterally Cardio: regular rate and rhythm GI: soft, moderate tenderness. incisions look good  Disposition:    Allergies as of 07/04/2018      Reactions   Macrolides And Ketolides Nausea Only   Aricept [donepezil Hcl] Nausea And Vomiting   Codeine Nausea And Vomiting   Morphine Nausea And Vomiting   Penicillin G Nausea Only   Penicillins Nausea And Vomiting, Other (See Comments)   HEADACHE Has patient had a PCN reaction causing immediate rash, facial/tongue/throat swelling, SOB or lightheadedness with hypotension: unkn Has patient had a PCN reaction causing severe rash involving mucus membranes or skin necrosis: unkn Has patient had a PCN reaction that required hospitalization: unkn Has patient had a PCN reaction occurring within the last 10 years: unkn If all of the above answers are "NO", then may proceed with Cephalosporin use.   Statins Other (See Comments)   Doesn't remember   Streptomycin Other (See Comments)   headache   Tetracycline Nausea Only   Tetracyclines & Related Rash      Medication List    TAKE these medications   albuterol 108 (90 Base) MCG/ACT inhaler Commonly known as:  PROVENTIL HFA;VENTOLIN HFA Inhale 2 puffs into the lungs every 6 (six) hours as needed for wheezing or shortness of breath.   ALIGN 4 MG Caps Take 4 mg by mouth every evening.   azaTHIOprine 50 MG tablet Commonly known as:  IMURAN Take 50 mg by mouth 2 (two) times daily.   b complex vitamins tablet Take 1 tablet by mouth daily.   BENADRYL ITCH STOPPING 2 % Gel Generic drug:  DIPHENHYDRAMINE HCL (TOPICAL) Apply 1 application topically daily as needed (itch relief).   BIOTIN PO Take 1 tablet by mouth every evening.   budesonide-formoterol 160-4.5 MCG/ACT inhaler Commonly known as:  SYMBICORT Inhale 1 puff into the lungs daily as needed (shortness of breath or wheezing).   cetirizine 10 MG tablet Commonly known as:  ZYRTEC Take 10 mg by mouth daily. Reported on 02/08/2016   cloNIDine 0.1 MG tablet Commonly known as:  CATAPRES Take 0.1 mg by mouth 2 (two) times daily.   colesevelam 625 MG tablet Commonly known as:  WELCHOL Take 1,875 mg by mouth 2 (two) times daily with a meal.   CoQ10 200 MG Caps Take 200 mg by mouth daily.   cycloSPORINE modified 100 MG capsule Commonly known as:  NEORAL Take 100 mg by mouth 2 (two) times daily.   fenofibrate 48 MG tablet Commonly known as:  TRICOR Take 48 mg by mouth every evening.   ferrous  sulfate 325 (65 FE) MG tablet Take 325 mg by mouth daily with breakfast.   Fish Oil 1000 MG Caps Take 1,000 mg by mouth every evening.   FLUoxetine 20 MG tablet Commonly known as:  PROZAC Take 20 mg by mouth daily.   fluticasone 50 MCG/ACT nasal spray Commonly known as:  FLONASE Place 1 spray into both nostrils daily as needed for allergies or rhinitis.   furosemide 40 MG tablet Commonly known as:  LASIX Take 40-80 mg  by mouth See admin instructions. Give 2 tablets(80 mg)  in the morning daily then also give 1 tablet in the evening.   L-Lysine 500 MG Tabs Take 500 mg by mouth daily.   levothyroxine 75 MCG tablet Commonly known as:  SYNTHROID, LEVOTHROID Take 75 mcg by mouth daily.   Lutein-Zeaxanthin 25-5 MG Caps Take 1 capsule by mouth daily.   metoprolol succinate 100 MG 24 hr tablet Commonly known as:  TOPROL-XL Take 100 mg by mouth every evening. Reported on 02/10/2016   MOVE FREE PO Take 1 tablet by mouth every evening.   multivitamin with minerals Tabs tablet Take 1 tablet by mouth every evening.   MYRBETRIQ 25 MG Tb24 tablet Generic drug:  mirabegron ER Take 25 mg by mouth every evening.   omeprazole 20 MG capsule Commonly known as:  PRILOSEC Take 20 mg by mouth every evening.   ondansetron 4 MG tablet Commonly known as:  ZOFRAN Take 1 tablet (4 mg total) by mouth every 8 (eight) hours as needed for nausea or vomiting.   oxybutynin 10 MG 24 hr tablet Commonly known as:  DITROPAN-XL Take 10 mg by mouth daily.   phenylephrine-shark liver oil-mineral oil-petrolatum 0.25-3-14-71.9 % rectal ointment Commonly known as:  PREPARATION H Place 1 application rectally 2 (two) times daily as needed for hemorrhoids.   POTASSIUM GLUCONATE PO Take 550 mg by mouth every evening.   predniSONE 5 MG tablet Commonly known as:  DELTASONE Take 5 mg by mouth daily with breakfast.   promethazine 25 MG tablet Commonly known as:  PHENERGAN Take 25 mg by mouth every 6 (six) hours as needed for nausea or vomiting.   Resveratrol 100 MG Caps Take 100 mg by mouth every evening.   sulfamethoxazole-trimethoprim 400-80 MG tablet Commonly known as:  BACTRIM,SEPTRA Take 1 tablet by mouth 3 (three) times a week. Monday, Wednesday, and Friday mornings   traMADol 50 MG tablet Commonly known as:  ULTRAM Take 1 tablet (50 mg total) by mouth every 6 (six) hours as needed (mild pain).   Vitamin D 2000  units tablet Take 2,000 Units by mouth every evening.   XARELTO 10 MG Tabs tablet Generic drug:  rivaroxaban Take 10 mg by mouth every evening.   Zinc 50 MG Tabs Take 50 mg by mouth every evening.       Contact information for follow-up providers    Autumn Messing III, MD Follow up in 2 week(s).   Specialty:  General Surgery Contact information: [place] [address] [phone]            Contact information for after-discharge care    Destination    [facility] Preferred SNF .   Service:  Skilled Nursing Contact information: [address] [place] [phone]                  Signed: Merrie Roof 07/04/2018, 3:47 PM

## 2018-07-04 NOTE — Progress Notes (Signed)
CRITICAL VALUE ALERT  Critical Value:  Troponin 0.04  Date & Time Notied:  07/04/2018 at 1305 Provider Notified: Dr. Autumn Messing was paged  Orders Received/Actions taken: still waiting for new orders.

## 2018-07-04 NOTE — Clinical Social Work Note (Signed)
Clinical Social Worker facilitated patient discharge including contacting patient family and facility to confirm patient discharge plans.  Clinical information faxed to facility and family agreeable with plan.  CSW arranged ambulance transport via [facility] to [facility].  RN to call [phone] for report prior to discharge.  Clinical Social Worker will sign off for now as social work intervention is no longer needed. Please consult us again if new need arises.  Maizee Reinhold, LCSWA [phone]  

## 2018-07-04 NOTE — Clinical Social Work Placement (Signed)
   CLINICAL SOCIAL WORK PLACEMENT  NOTE  Date:  07/04/2018  Patient Details  Name: Patricia [name] MRN: [id number] Date of Birth: November 10, 1931  Clinical Social Work is seeking post-discharge placement for this patient at the [place] level of care (*CSW will initial, date and re-position this form in  chart as items are completed):  Yes   Patient/family provided with [facility] Work Department's list of facilities offering this level of care within the geographic area requested by the patient (or if unable, by the patient's family).  Yes   Patient/family informed of their freedom to choose among providers that offer the needed level of care, that participate in Medicare, Medicaid or managed care program needed by the patient, have an available bed and are willing to accept the patient.  Yes   Patient/family informed of [facility]'s ownership interest in [facility] and [facility], as well as of the fact that they are under no obligation to receive care at these facilities.  PASRR submitted to EDS on       PASRR number received on 07/02/18     Existing PASRR number confirmed on       FL2 transmitted to all facilities in geographic area requested by pt/family on 07/02/18     FL2 transmitted to all facilities within larger geographic area on       Patient informed that his/her managed care company has contracts with or will negotiate with certain facilities, including the following:        Yes   Patient/family informed of bed offers received.  Patient chooses bed at [facility]     Physician recommends and patient chooses bed at      Patient to be transferred to [facility] on 07/04/18.  Patient to be transferred to facility by [facility]     Patient family notified on 06/27/18 of transfer.  Name of family member notified:  pt husband [facility]     PHYSICIAN       Additional Comment:     _______________________________________________ Eileen Stanford, LCSW 07/04/2018, 2:41 PM

## 2018-07-04 NOTE — Clinical Social Work Note (Addendum)
Insurance still pending--awaiting to hear from facility.   2:40- Facility updated CSW that insurance should be obtained this afternoon. Pt's spouse going to the facility to complete paperwork.  [place], [place]

## 2018-07-04 NOTE — Progress Notes (Signed)
Pt transfer to x-ray.

## 2018-07-04 NOTE — Progress Notes (Signed)
3 Days Post-Op   Subjective/Chief Complaint: Complains of pain along right side. More confused today   Objective: Vital signs in last 24 hours: Temp:  [98.2 F (36.8 C)-98.5 F (36.9 C)] 98.5 F (36.9 C) (09/20 0826) Pulse Rate:  [62-69] 65 (09/20 0826) Resp:  [17-26] 26 (09/20 0826) BP: (122-160)/(53-85) 158/85 (09/20 0826) SpO2:  [95 %-99 %] 95 % (09/20 0826) Last BM Date: 06/30/18  Intake/Output from previous day: 09/19 0701 - 09/20 0700 In: 627.8 [P.O.:360; I.V.:267.8] Out: 1150 [Urine:1150] Intake/Output this shift: Total I/O In: 50 [P.O.:50] Out: -   General appearance: alert and cooperative Resp: clear to auscultation bilaterally Cardio: regular rate and rhythm GI: soft, moderate tenderness. good bs. incisions look good  Lab Results:  Recent Labs    07/01/18 1215 07/01/18 1235  WBC 6.1  --   HGB 13.3 12.6  HCT 42.0 37.0  PLT 194  --    BMET Recent Labs    07/01/18 1215 07/01/18 1235  NA 143 144  K 4.2 4.0  CL 110  --   CO2 22  --   GLUCOSE 102* 96  BUN 12  --   CREATININE 1.93*  --   CALCIUM 11.0*  --    PT/INR Recent Labs    07/01/18 1216  LABPROT 13.3  INR 1.02   ABG No results for input(s): PHART, HCO3 in the last 72 hours.  Invalid input(s): PCO2, PO2  Studies/Results: No results found.  Anti-infectives: Anti-infectives (From admission, onward)   Start     Dose/Rate Route Frequency Ordered Stop   07/02/18 0900  sulfamethoxazole-trimethoprim (BACTRIM,SEPTRA) 400-80 MG per tablet 1 tablet    Note to Pharmacy:  Monday, Wednesday, and Friday mornings     1 tablet Oral Once per day on Mon Wed Fri 07/01/18 1524     07/01/18 1115  ciprofloxacin (CIPRO) IVPB 400 mg     400 mg 200 mL/hr over 60 Minutes Intravenous On call to O.R. 07/01/18 1109 07/01/18 1300   07/01/18 1105  ciprofloxacin (CIPRO) 400 MG/200ML IVPB    Note to Pharmacy:  [place]ville Lewis, Lindsi   : cabinet override      07/01/18 1105 07/01/18 1300      Assessment/Plan: s/p  Procedure(s): LAPAROSCOPIC CHOLECYSTECTOMY WITH INTRAOPERATIVE CHOLANGIOGRAM (N/A) Will check CXR and EKG for chest pain  Check wbc, lft's, and kidney function Await SNF placement PT  LOS: 0 days    TOTH III,Biagio Snelson S 07/04/2018

## 2018-07-04 NOTE — Progress Notes (Signed)
Pt back from radiology 

## 2018-07-04 NOTE — Progress Notes (Signed)
Dr.Toth informed about the troponin result but no new order made at this time, paged again for constipation issue.

## 2018-07-04 NOTE — Progress Notes (Signed)
Pt for discharge going to [facility] report given to Herbert Spires, RN , no complain of pain at this time, endorsed to Z. Calwitan, Therapist, sports.

## 2018-07-07 ENCOUNTER — Non-Acute Institutional Stay (SKILLED_NURSING_FACILITY): Payer: Medicare Other | Admitting: Internal Medicine

## 2018-07-07 ENCOUNTER — Encounter: Payer: Self-pay | Admitting: Internal Medicine

## 2018-07-07 DIAGNOSIS — E034 Atrophy of thyroid (acquired): Secondary | ICD-10-CM | POA: Diagnosis not present

## 2018-07-07 DIAGNOSIS — Z940 Kidney transplant status: Secondary | ICD-10-CM

## 2018-07-07 DIAGNOSIS — I10 Essential (primary) hypertension: Secondary | ICD-10-CM | POA: Diagnosis not present

## 2018-07-07 DIAGNOSIS — K8010 Calculus of gallbladder with chronic cholecystitis without obstruction: Secondary | ICD-10-CM | POA: Diagnosis not present

## 2018-07-07 NOTE — Progress Notes (Signed)
:    Location:  [place] Room Number: 937T Place of Service:  SNF (31)  Patricia Oshiro D. Sheppard Coil, MD  Patient Care Team: [name], MD as PCP - General (Internal Medicine)  Extended Emergency Contact Information Primary Emergency Contact: Humes,Howard Address: [address]          [place] of [place] Phone: [phone] Mobile Phone: [phone] Relation: Spouse Secondary Emergency Contact: Franki Cabot, [place] Mobile Phone: [phone] Relation: Son     Allergies: Macrolides and ketolides; Aricept [donepezil hcl]; Codeine; Morphine; Penicillin g; Penicillins; Statins; Streptomycin; Tetracycline; and Tetracyclines & related  Chief Complaint  Patient presents with  . New Admit To SNF    Admit to [facility]    HPI: Patient is 84 y.o. female with history of renal transplant, hypertension, hypothyroidism, depression, anxiety, diabetes mellitus type 2, COPD, hyperlipidemia, who presented to her PCP with abdominal pain.  Dr. Felipa Eth at [facility] surgery to evaluate her for possible gallstones.  Patient has been having right upper quadrant pain for the last couple months.  She describes the pain is severe.  Pain was not associated with nausea or vomiting.  She had an ultrasound that did show stones in the gallbladder but no gallbladder wall thickening and a mild dilatation of the common bile duct.  Her liver functions were relatively normal.  Patient was admitted to [facility] from 9/17-28 where she underwent a lap cholecystectomy and did well postoperatively she was found to be confused and is probably this way at home some.  Patient was admitted to skilled nursing facility for OT/PT and supportive care.  While at skilled nursing facility patient will be followed for history of kidney transplant treated with Imuran and cyclosporine, hypertension treated with clonidine Lasix  and metoprolol and hypothyroidism treated with replacement.  Past Medical History:  Diagnosis Date  . Abnormal gait   . Anxiety   . Arthritis   . Asthma   . COPD, mild ([place])   . Coronary artery disease    pt denies  . Depression   . Diverticulitis   . Dizziness   . DVT (deep venous thrombosis) ([place])   . GERD (gastroesophageal reflux disease)   . Hx of kidney transplant    right  . Hypertension   . Hypothyroidism   . Multiple allergies   . Pneumonia   . Pre-diabetes   . Reflux   . Renal disorder    had kidney transplant in 2009  . Thyroid disease     Past Surgical History:  Procedure Laterality Date  . ANTERIOR APPROACH HEMI HIP ARTHROPLASTY Right 02/10/2016   Procedure: TOTAL HIP ARTHROPLASTY ANTERIOR APPROACH;  Surgeon: Rod Can, MD;  Location: [place];  Service: Orthopedics;  Laterality: Right;  . CHOLECYSTECTOMY N/A 07/01/2018   Procedure: LAPAROSCOPIC CHOLECYSTECTOMY WITH INTRAOPERATIVE CHOLANGIOGRAM;  Surgeon: Jovita Kussmaul, MD;  Location: [place];  Service: General;  Laterality: N/A;  . COLONOSCOPY    . NEPHRECTOMY TRANSPLANTED ORGAN    . TUMOR REMOVAL     Brain     Allergies as of 07/07/2018      Reactions   Macrolides And Ketolides Nausea Only   Aricept [donepezil Hcl] Nausea And Vomiting   Codeine Nausea And Vomiting   Morphine Nausea And Vomiting   Penicillin G Nausea Only   Penicillins Nausea And Vomiting, Other (See Comments)  HEADACHE Has patient had a PCN reaction causing immediate rash, facial/tongue/throat swelling, SOB or lightheadedness with hypotension: unkn Has patient had a PCN reaction causing severe rash involving mucus membranes or skin necrosis: unkn Has patient had a PCN reaction that required hospitalization: unkn Has patient had a PCN reaction occurring within the last 10 years: unkn If all of the above answers are "NO", then may proceed with Cephalosporin use.   Statins Other (See Comments)   Doesn't remember   Streptomycin Other  (See Comments)   headache   Tetracycline Nausea Only   Tetracyclines & Related Rash      Medication List        Accurate as of 07/07/18  1:00 PM. Always use your most recent med list.          albuterol 108 (90 Base) MCG/ACT inhaler Commonly known as:  PROVENTIL HFA;VENTOLIN HFA Inhale 2 puffs into the lungs every 6 (six) hours as needed for wheezing or shortness of breath.   ALIGN 4 MG Caps Take 4 mg by mouth every evening.   azaTHIOprine 50 MG tablet Commonly known as:  IMURAN Take 50 mg by mouth 2 (two) times daily.   b complex vitamins tablet Take 1 tablet by mouth daily.   BENADRYL ITCH STOPPING 2 % Gel Generic drug:  DIPHENHYDRAMINE HCL (TOPICAL) Apply 1 application topically daily as needed (itch relief).   BIOTIN PO Take 1 tablet by mouth every evening.   budesonide-formoterol 160-4.5 MCG/ACT inhaler Commonly known as:  SYMBICORT Inhale 1 puff into the lungs daily as needed (shortness of breath or wheezing).   cetirizine 10 MG tablet Commonly known as:  ZYRTEC Take 10 mg by mouth daily. Reported on 02/08/2016   cloNIDine 0.1 MG tablet Commonly known as:  CATAPRES Take 0.1 mg by mouth 2 (two) times daily.   colesevelam 625 MG tablet Commonly known as:  WELCHOL Take 1,875 mg by mouth 2 (two) times daily with a meal.   CoQ10 200 MG Caps Take 200 mg by mouth daily.   cycloSPORINE modified 100 MG capsule Commonly known as:  NEORAL Take 100 mg by mouth 2 (two) times daily.   fenofibrate 48 MG tablet Commonly known as:  TRICOR Take 48 mg by mouth every evening.   ferrous sulfate 325 (65 FE) MG tablet Take 325 mg by mouth daily with breakfast.   Fish Oil 1000 MG Caps Take 1,000 mg by mouth every evening.   FLUoxetine 20 MG tablet Commonly known as:  PROZAC Take 20 mg by mouth daily.   fluticasone 50 MCG/ACT nasal spray Commonly known as:  FLONASE Place 1 spray into both nostrils daily as needed for allergies or rhinitis.   furosemide 40 MG  tablet Commonly known as:  LASIX Take 40-80 mg by mouth See admin instructions. Give 2 tablets(80 mg)  in the morning daily then also give 1 tablet in the evening.   L-Lysine 500 MG Tabs Take 500 mg by mouth daily.   levothyroxine 75 MCG tablet Commonly known as:  SYNTHROID, LEVOTHROID Take 75 mcg by mouth daily.   Lutein-Zeaxanthin 25-5 MG Caps Take 1 capsule by mouth daily.   metoprolol succinate 100 MG 24 hr tablet Commonly known as:  TOPROL-XL Take 100 mg by mouth every evening. Reported on 02/10/2016   MOVE FREE PO Take 1 tablet by mouth every evening.   multivitamin with minerals Tabs tablet Take 1 tablet by mouth every evening.   MYRBETRIQ 25 MG Tb24 tablet Generic drug:  mirabegron ER Take 25 mg by mouth every evening.   omeprazole 20 MG capsule Commonly known as:  PRILOSEC Take 20 mg by mouth every evening.   ondansetron 4 MG tablet Commonly known as:  ZOFRAN Take 1 tablet (4 mg total) by mouth every 8 (eight) hours as needed for nausea or vomiting.   oxybutynin 10 MG 24 hr tablet Commonly known as:  DITROPAN-XL Take 10 mg by mouth daily.   phenylephrine-shark liver oil-mineral oil-petrolatum 0.25-3-14-71.9 % rectal ointment Commonly known as:  PREPARATION H Place 1 application rectally 2 (two) times daily as needed for hemorrhoids.   POTASSIUM GLUCONATE PO Take 550 mg by mouth every evening.   predniSONE 5 MG tablet Commonly known as:  DELTASONE Take 5 mg by mouth daily with breakfast.   promethazine 25 MG tablet Commonly known as:  PHENERGAN Take 25 mg by mouth every 6 (six) hours as needed for nausea or vomiting.   Resveratrol 100 MG Caps Take 100 mg by mouth every evening.   sulfamethoxazole-trimethoprim 400-80 MG tablet Commonly known as:  BACTRIM,SEPTRA Take 1 tablet by mouth 3 (three) times a week. Monday, Wednesday, and Friday mornings   traMADol 50 MG tablet Commonly known as:  ULTRAM Take 1 tablet (50 mg total) by mouth every 6 (six)  hours as needed (mild pain).   traMADol 50 MG tablet Commonly known as:  ULTRAM Take 1-2 tablets (50-100 mg total) by mouth every 6 (six) hours as needed.   Vitamin D 2000 units tablet Take 2,000 Units by mouth every evening.   XARELTO 10 MG Tabs tablet Generic drug:  rivaroxaban Take 10 mg by mouth every evening.   Zinc 50 MG Tabs Take 50 mg by mouth every evening.       No orders of the defined types were placed in this encounter.   Immunization History  Administered Date(s) Administered  . PPD Test 02/14/2016    Social History   Tobacco Use  . Smoking status: Never Smoker  . Smokeless tobacco: Never Used  Substance Use Topics  . Alcohol use: No    Family history is   Family History  Problem Relation Age of Onset  . Alzheimer's disease Mother   . High Cholesterol Mother   . Alcoholism Father       Review of Systems  DATA OBTAINED: from patient, nurse GENERAL:  no fevers, fatigue, appetite changes SKIN: No itching, or rash EYES: No eye pain, redness, discharge EARS: No earache, tinnitus, change in hearing NOSE: No congestion, drainage or bleeding  MOUTH/THROAT: No mouth or tooth pain, No sore throat RESPIRATORY: No cough, wheezing, SOB CARDIAC: No chest pain, palpitations, lower extremity edema  GI: No abdominal pain, No N/V/D or constipation, No heartburn or reflux  GU: No dysuria, frequency or urgency, or incontinence  MUSCULOSKELETAL: No unrelieved bone/joint pain NEUROLOGIC: No headache, dizziness or focal weakness PSYCHIATRIC: No c/o anxiety or sadness   Vitals:   07/07/18 1210  BP: (!) 158/69  Pulse: (!) 58  Resp: 18  Temp: (!) 96.9 F (36.1 C)    SpO2 Readings from Last 1 Encounters:  07/04/18 98%   Body mass index is 22.07 kg/m.     Physical Exam  GENERAL APPEARANCE: Alert, conversant,  No acute distress.  SKIN: No diaphoresis rash HEAD: Normocephalic, atraumatic  EYES: Conjunctiva/lids clear. Pupils round, reactive. EOMs  intact.  EARS: External exam WNL, canals clear. Hearing grossly normal.  NOSE: No deformity or discharge.  MOUTH/THROAT: Lips w/o lesions  RESPIRATORY:  Breathing is even, unlabored. Lung sounds are clear   CARDIOVASCULAR: Heart RRR no murmurs, rubs or gallops. No peripheral edema.   GASTROINTESTINAL: Abdomen is soft, mild right upper quadrant tenderness, not distended w/ normal bowel sounds. GENITOURINARY: Bladder non tender, not distended  MUSCULOSKELETAL: No abnormal joints or musculature NEUROLOGIC:  Cranial nerves 2-12 grossly intact. Moves all extremities  PSYCHIATRIC: Mood and affect appropriate to situation, no behavioral issues  Patient Active Problem List   Diagnosis Date Noted  . Cholecystitis with cholelithiasis 07/01/2018  . Postoperative anemia due to acute blood loss 02/15/2016  . Hyperlipidemia 02/15/2016  . Displaced fracture of right femoral neck ([place]) 02/10/2016  . Chronic diastolic CHF (congestive heart failure) ([place]) 02/08/2016  . Closed right hip fracture ([place]) 02/08/2016  . Hip fracture ([place]) 02/08/2016  . Acute diastolic CHF (congestive heart failure) ([place]) 05/27/2015  . Urinary incontinence 05/27/2015  . Escherichia coli urinary tract infection   . Other emphysema ([place])   . Hypokalemia   . Hypomagnesemia   . Sepsis secondary to UTI ([place]) 05/22/2015  . DVT, lower extremity, proximal ([place]) 03/02/2014  . CKD (chronic kidney disease) stage 3, GFR 30-59 ml/min (H[facility]) 03/02/2014  . Essential hypertension 03/02/2014  . DVT (deep venous thrombosis) ([place]) 03/01/2014  . Acute renal failure superimposed on stage 3 chronic kidney disease ([place]) 03/01/2014  . Gastroenteritis 11/22/2013  . Nausea & vomiting 11/22/2013  . Hypercalcemia 11/22/2013  . Infection due to ESBL-producing Escherichia coli 10/07/2013  . Septicemia due to E. coli ([place]) 10/07/2013  . Urinary tract infection 10/05/2013  . Sepsis ([place]) 10/05/2013  . History of renal transplant   . Hx of kidney  transplant   . Hypertension   . Thyroid disease   . Multiple allergies   . Abnormal gait   . Reflux   . Diverticulitis   . Hypothyroidism   . Depression   . Anxiety   . COPD (chronic obstructive pulmonary disease) ([place])   . Dizziness   . Diabetes type 2, controlled [facility])       Labs reviewed: Basic Metabolic Panel:    Component Value Date/Time   NA 140 07/04/2018 1145   NA 138 02/21/2016   K 3.7 07/04/2018 1145   CL 113 (H) 07/04/2018 1145   CO2 18 (L) 07/04/2018 1145   GLUCOSE 121 (H) 07/04/2018 1145   BUN 18 07/04/2018 1145   BUN 29 (A) 02/21/2016   CREATININE 1.66 (H) 07/04/2018 1145   CALCIUM 10.1 07/04/2018 1145   PROT 6.0 (L) 07/04/2018 1145   ALBUMIN 2.6 (L) 07/04/2018 1145   AST 23 07/04/2018 1145   ALT 13 07/04/2018 1145   ALKPHOS 49 07/04/2018 1145   BILITOT 0.9 07/04/2018 1145   GFRNONAA 27 (L) 07/04/2018 1145   GFRAA 32 (L) 07/04/2018 1145    Recent Labs    05/25/18 1754 07/01/18 1215 07/01/18 1235 07/04/18 1145  NA 142 143 144 140  K 3.6 4.2 4.0 3.7  CL 105 110  --  113*  CO2 23 22  --  18*  GLUCOSE 144* 102* 96 121*  BUN 31* 12  --  18  CREATININE 1.94* 1.93*  --  1.66*  CALCIUM 10.6* 11.0*  --  10.1   Liver Function Tests: Recent Labs    05/25/18 1754 07/01/18 1215 07/04/18 1145  AST 47* 31 23  ALT 34 13 13  ALKPHOS 62 54 49  BILITOT 0.9 1.1 0.9  PROT 7.7 7.0 6.0*  ALBUMIN 3.8 3.2* 2.6*  Recent Labs    05/25/18 1754  LIPASE 46   No results for input(s): AMMONIA in the last 8760 hours. CBC: Recent Labs    08/28/17 2008 05/25/18 1754 07/01/18 1215 07/01/18 1235 07/04/18 1145  WBC 7.3 6.8 6.1  --  8.3  NEUTROABS 4.3  --   --   --  5.5  HGB 12.3 12.6 13.3 12.6 11.2*  HCT 38.3 39.8 42.0 37.0 35.1*  MCV 92.5 99.7 98.8  --  98.0  PLT 246 230 194  --  170   Lipid No results for input(s): CHOL, HDL, LDLCALC, TRIG in the last 8760 hours.  Cardiac Enzymes: Recent Labs    07/04/18 1145  TROPONINI 0.04*   BNP: No  results for input(s): BNP in the last 8760 hours. No results found for: [facility] Lab Results  Component Value Date   HGBA1C 5.1 07/01/2018   Lab Results  Component Value Date   TSH 1.51 03/21/2016   No results found for: VITAMINB12 No results found for: FOLATE No results found for: IRON, TIBC, FERRITIN  Imaging and Procedures obtained prior to SNF admission: Dg Cholangiogram Operative  Result Date: 07/01/2018 CLINICAL DATA:  LAP CHOLE *38 secs fl time used* EXAM: INTRAOPERATIVE CHOLANGIOGRAM TECHNIQUE: Cholangiographic images from the C-arm fluoroscopic device were submitted for interpretation post-operatively. Please see the procedural report for the amount of contrast and the fluoroscopy time utilized. COMPARISON:  MRCP 06/13/2018 FINDINGS: No persistent filling defects in the common duct. Intrahepatic ducts are incompletely visualized, appearing decompressed centrally. In the distal CBD is a segment of abrupt tapered narrowing with some shouldering at the transition, and a short narrow channel distally. Contrast passes into the duodenum. : 1. Negative for retained common duct stone. 2. Abnormal narrowing in the distal CBD. No pancreatic lesion was noted on recent MRCP. Can't exclude ampullary or mucosal lesion. Consider ERCP with brushings. Electronically Signed   By: Lucrezia Europe M.D.   On: 07/01/2018 14:01     Not all labs, radiology exams or other studies done during hospitalization come through on my EPIC note; however they are reviewed by me.    Assessment and Plan  Cholelithiasis-status post lap chole; no apparent complications SNF -patient admitted to skilled nursing facility for OT/PT and for supportive care  Status post kidney transplant SNF -no problems; continue Imuran 50 mg twice daily and cyclosporine 100 mg twice daily  Hypertension SNF -controlled; continue clonidine 0.1 mg twice daily, Lasix 80 mg in the morning and 40 mg in the afternoon and metoprolol 100 mg  nightly  Hypothyroidism SNF -continue Synthroid 75 mcg daily    Time spent greater than 45 minutes;> 50% of time with patient was spent reviewing records, labs, tests and studies, counseling and developing plan of care  Webb Silversmith D. Sheppard Coil, MD

## 2018-07-08 ENCOUNTER — Encounter: Payer: Self-pay | Admitting: Internal Medicine

## 2018-07-08 NOTE — Progress Notes (Signed)
On 07/08/2018 at 1415 wasted tramadol 25 mg in the sharps container, witnessed by 2nd [employer].

## 2018-07-10 LAB — BASIC METABOLIC PANEL
BUN: 25 — AB (ref 4–21)
CREATININE: 2.4 — AB (ref 0.5–1.1)
GLUCOSE: 129
Potassium: 3.5 (ref 3.4–5.3)
Sodium: 140 (ref 137–147)

## 2018-07-10 LAB — CBC AND DIFFERENTIAL
HEMATOCRIT: 35 — AB (ref 36–46)
Hemoglobin: 11.5 — AB (ref 12.0–16.0)
PLATELETS: 312 (ref 150–399)
WBC: 8.2

## 2018-07-18 LAB — BASIC METABOLIC PANEL
BUN: 28 — AB (ref 4–21)
Creatinine: 2.2 — AB (ref 0.5–1.1)
GLUCOSE: 82
POTASSIUM: 3.5 (ref 3.4–5.3)
Sodium: 142 (ref 137–147)

## 2018-07-23 ENCOUNTER — Non-Acute Institutional Stay (SKILLED_NURSING_FACILITY): Payer: Medicare Other | Admitting: Internal Medicine

## 2018-07-23 DIAGNOSIS — I10 Essential (primary) hypertension: Secondary | ICD-10-CM | POA: Diagnosis not present

## 2018-07-23 DIAGNOSIS — Z940 Kidney transplant status: Secondary | ICD-10-CM | POA: Diagnosis not present

## 2018-07-23 DIAGNOSIS — E079 Disorder of thyroid, unspecified: Secondary | ICD-10-CM

## 2018-07-23 DIAGNOSIS — K8010 Calculus of gallbladder with chronic cholecystitis without obstruction: Secondary | ICD-10-CM

## 2018-07-23 NOTE — Progress Notes (Signed)
Location:  [place] of Service:  SNF (31)SNF Provider: Inocencio Homes MD  PCP: [name], MD Patient Care Team: [name], MD as PCP - General (Internal Medicine)  Extended Emergency Contact Information Primary Emergency Contact: Rizo,Howard Address: [address]          [place] of [place] Phone: [phone] Mobile Phone: [phone] Relation: Spouse Secondary Emergency Contact: Franki Cabot, [place] Mobile Phone: [phone] Relation: Son  Allergies  Allergen Reactions  . Macrolides And Ketolides Nausea Only  . Aricept [Donepezil Hcl] Nausea And Vomiting  . Codeine Nausea And Vomiting  . Morphine Nausea And Vomiting  . Penicillin G Nausea Only  . Penicillins Nausea And Vomiting and Other (See Comments)    HEADACHE Has patient had a PCN reaction causing immediate rash, facial/tongue/throat swelling, SOB or lightheadedness with hypotension: unkn Has patient had a PCN reaction causing severe rash involving mucus membranes or skin necrosis: unkn Has patient had a PCN reaction that required hospitalization: unkn Has patient had a PCN reaction occurring within the last 10 years: unkn If all of the above answers are "NO", then may proceed with Cephalosporin use.   . Statins Other (See Comments)    Doesn't remember  . Streptomycin Other (See Comments)    headache   . Tetracycline Nausea Only  . Tetracyclines & Related Rash    Chief Complaint  Patient presents with  . Discharge Note    HPI:  84 y.o. female with history of renal, hypertension, hypothyroidism, depression, anxiety, diabetes type 2, COPD, hyperlipidemia, who presented to her PCP with abdominal pain.  Dr. Felipa Eth sent her to [facility] surgery to be evaluated for possible gallstones.  Patient had an ultrasound that did show stones in the gallbladder but no gallbladder wall thickening  and there was a mild dilatation of the common bile duct.  Her liver functions were relatively normal.  Patient was admitted to [facility] from 9/17-28 where she underwent a lap cholecystectomy and did well postoperatively.  She was found to be confused and is probable this way at home.  Patient was admitted to skilled nursing facility for OT/PT and supportive care and is now ready to be discharged home.    Past Medical History:  Diagnosis Date  . Abnormal gait   . Anxiety   . Arthritis   . Asthma   . COPD, mild ([place])   . Coronary artery disease    pt denies  . Depression   . Diverticulitis   . Dizziness   . DVT (deep venous thrombosis) ([place])   . GERD (gastroesophageal reflux disease)   . Hx of kidney transplant    right  . Hypertension   . Hypothyroidism   . Multiple allergies   . Pneumonia   . Pre-diabetes   . Reflux   . Renal disorder    had kidney transplant in 2009  . Thyroid disease     Past Surgical History:  Procedure Laterality Date  . ANTERIOR APPROACH HEMI HIP ARTHROPLASTY Right 02/10/2016   Procedure: TOTAL HIP ARTHROPLASTY ANTERIOR APPROACH;  Surgeon: Rod Can, MD;  Location: [place];  Service: Orthopedics;  Laterality: Right;  . CHOLECYSTECTOMY N/A 07/01/2018   Procedure: LAPAROSCOPIC CHOLECYSTECTOMY WITH INTRAOPERATIVE CHOLANGIOGRAM;  Surgeon: Jovita Kussmaul, MD;  Location: [place];  Service: General;  Laterality: N/A;  . COLONOSCOPY    .  NEPHRECTOMY TRANSPLANTED ORGAN    . TUMOR REMOVAL     Brain      reports that she has never smoked. She has never used smokeless tobacco. She reports that she does not drink alcohol or use drugs. Social History   Socioeconomic History  . Marital status: Married    Spouse name: [name]  . Number of children: 2  . Years of education: college  . Highest education level: Not on file  Occupational History  . Occupation: retired  Scientific laboratory technician  . Financial resource strain: Not on file  . Food insecurity:     Worry: Not on file    Inability: Not on file  . Transportation needs:    Medical: Not on file    Non-medical: Not on file  Tobacco Use  . Smoking status: Never Smoker  . Smokeless tobacco: Never Used  Substance and Sexual Activity  . Alcohol use: No  . Drug use: No  . Sexual activity: Not on file  Lifestyle  . Physical activity:    Days per week: Not on file    Minutes per session: Not on file  . Stress: Not on file  Relationships  . Social connections:    Talks on phone: Not on file    Gets together: Not on file    Attends religious service: Not on file    Active member of club or organization: Not on file    Attends meetings of clubs or organizations: Not on file    Relationship status: Not on file  . Intimate partner violence:    Fear of current or ex partner: Not on file    Emotionally abused: Not on file    Physically abused: Not on file    Forced sexual activity: Not on file  Other Topics Concern  . Not on file  Social History Narrative   Patient lives at home with her husband [name]). Patient is retired. Patient has two children. Patient has college education.   Right handed.   Caffeine- None- Tea during day one cup    Pertinent  Health Maintenance Due  Topic Date Due  . FOOT EXAM  04/01/1942  . OPHTHALMOLOGY EXAM  04/01/1942  . PNA vac Low Risk Adult (1 of 2 - PCV13) 04/02/1999  . INFLUENZA VACCINE  05/15/2018  . HEMOGLOBIN A1C  12/30/2018  . DEXA SCAN  Completed    Medications: Allergies as of 07/23/2018      Reactions   Macrolides And Ketolides Nausea Only   Aricept [donepezil Hcl] Nausea And Vomiting   Codeine Nausea And Vomiting   Morphine Nausea And Vomiting   Penicillin G Nausea Only   Penicillins Nausea And Vomiting, Other (See Comments)   HEADACHE Has patient had a PCN reaction causing immediate rash, facial/tongue/throat swelling, SOB or lightheadedness with hypotension: unkn Has patient had a PCN reaction causing severe rash involving mucus  membranes or skin necrosis: unkn Has patient had a PCN reaction that required hospitalization: unkn Has patient had a PCN reaction occurring within the last 10 years: unkn If all of the above answers are "NO", then may proceed with Cephalosporin use.   Statins Other (See Comments)   Doesn't remember   Streptomycin Other (See Comments)   headache   Tetracycline Nausea Only   Tetracyclines & Related Rash      Medication List        Accurate as of 07/23/18 11:59 PM. Always use your most recent med list.  albuterol 108 (90 Base) MCG/ACT inhaler Commonly known as:  PROVENTIL HFA;VENTOLIN HFA Inhale 2 puffs into the lungs every 6 (six) hours as needed for wheezing or shortness of breath.   ALIGN 4 MG Caps Take 4 mg by mouth every evening.   azaTHIOprine 50 MG tablet Commonly known as:  IMURAN Take 50 mg by mouth 2 (two) times daily.   b complex vitamins tablet Take 1 tablet by mouth daily.   BENADRYL ITCH STOPPING 2 % Gel Generic drug:  DIPHENHYDRAMINE HCL (TOPICAL) Apply 1 application topically daily as needed (itch relief).   BIOTIN PO Take 1 tablet by mouth every evening.   budesonide-formoterol 160-4.5 MCG/ACT inhaler Commonly known as:  SYMBICORT Inhale 1 puff into the lungs daily as needed (shortness of breath or wheezing).   cetirizine 10 MG tablet Commonly known as:  ZYRTEC Take 10 mg by mouth daily. Reported on 02/08/2016   cloNIDine 0.1 MG tablet Commonly known as:  CATAPRES Take 0.1 mg by mouth 2 (two) times daily.   colesevelam 625 MG tablet Commonly known as:  WELCHOL Take 1,875 mg by mouth 2 (two) times daily with a meal.   CoQ10 200 MG Caps Take 200 mg by mouth daily.   cycloSPORINE modified 100 MG capsule Commonly known as:  NEORAL Take 100 mg by mouth 2 (two) times daily.   fenofibrate 48 MG tablet Commonly known as:  TRICOR Take 48 mg by mouth every evening.   ferrous sulfate 325 (65 FE) MG tablet Take 325 mg by mouth daily with  breakfast.   Fish Oil 1000 MG Caps Take 1,000 mg by mouth every evening.   FLUoxetine 20 MG tablet Commonly known as:  PROZAC Take 20 mg by mouth daily.   fluticasone 50 MCG/ACT nasal spray Commonly known as:  FLONASE Place 1 spray into both nostrils daily as needed for allergies or rhinitis.   furosemide 40 MG tablet Commonly known as:  LASIX Take 80 mg by mouth daily.   L-Lysine 500 MG Tabs Take 500 mg by mouth daily.   levothyroxine 75 MCG tablet Commonly known as:  SYNTHROID, LEVOTHROID Take 75 mcg by mouth daily.   Lutein-Zeaxanthin 25-5 MG Caps Take 1 capsule by mouth daily.   metoprolol succinate 100 MG 24 hr tablet Commonly known as:  TOPROL-XL Take 100 mg by mouth every evening. Reported on 02/10/2016   MOVE FREE PO Take 1 tablet by mouth every evening.   multivitamin with minerals Tabs tablet Take 1 tablet by mouth every evening.   MYRBETRIQ 25 MG Tb24 tablet Generic drug:  mirabegron ER Take 25 mg by mouth every evening.   omeprazole 20 MG capsule Commonly known as:  PRILOSEC Take 20 mg by mouth every evening.   ondansetron 4 MG tablet Commonly known as:  ZOFRAN Take 1 tablet (4 mg total) by mouth every 8 (eight) hours as needed for nausea or vomiting.   oxybutynin 10 MG 24 hr tablet Commonly known as:  DITROPAN-XL Take 10 mg by mouth daily.   phenylephrine-shark liver oil-mineral oil-petrolatum 0.25-3-14-71.9 % rectal ointment Commonly known as:  PREPARATION H Place 1 application rectally 2 (two) times daily as needed for hemorrhoids.   POTASSIUM GLUCONATE PO Take 550 mg by mouth every evening.   predniSONE 5 MG tablet Commonly known as:  DELTASONE Take 5 mg by mouth daily with breakfast.   promethazine 25 MG tablet Commonly known as:  PHENERGAN Take 25 mg by mouth every 6 (six) hours as needed for nausea  or vomiting.   Resveratrol 100 MG Caps Take 100 mg by mouth every evening.   sulfamethoxazole-trimethoprim 400-80 MG  tablet Commonly known as:  BACTRIM,SEPTRA Take 1 tablet by mouth 3 (three) times a week. Monday, Wednesday, and Friday mornings   Vitamin D 2000 units tablet Take 2,000 Units by mouth every evening.   XARELTO 10 MG Tabs tablet Generic drug:  rivaroxaban Take 10 mg by mouth every evening.   Zinc 50 MG Tabs Take 50 mg by mouth every evening.        Vitals:   08/03/18 1307  BP: (!) 158/69  Pulse: (!) 58  Resp: 18  Temp: (!) 96.9 F (36.1 C)  Weight: 132 lb (59.9 kg)  Height: 5\' 5"  (1.651 m)   Body mass index is 21.97 kg/m.  Physical Exam  GENERAL APPEARANCE: Alert, conversant. No acute distress.  HEENT: Unremarkable. RESPIRATORY: Breathing is even, unlabored. Lung sounds are clear   CARDIOVASCULAR: Heart RRR no murmurs, rubs or gallops. No peripheral edema.  GASTROINTESTINAL: Abdomen is soft, non-tender, not distended w/ normal bowel sounds.  NEUROLOGIC: Cranial nerves 2-12 grossly intact. Moves all extremities   Labs reviewed: Basic Metabolic Panel: Recent Labs    05/25/18 1754 07/01/18 1215 07/01/18 1235 07/04/18 1145 07/10/18 07/18/18  NA 142 143 144 140 140 142  K 3.6 4.2 4.0 3.7 3.5 3.5  CL 105 110  --  113*  --   --   CO2 23 22  --  18*  --   --   GLUCOSE 144* 102* 96 121*  --   --   BUN 31* 12  --  18 25* 28*  CREATININE 1.94* 1.93*  --  1.66* 2.4* 2.2*  CALCIUM 10.6* 11.0*  --  10.1  --   --    No results found for: [facility] Liver Function Tests: Recent Labs    05/25/18 1754 07/01/18 1215 07/04/18 1145  AST 47* 31 23  ALT 34 13 13  ALKPHOS 62 54 49  BILITOT 0.9 1.1 0.9  PROT 7.7 7.0 6.0*  ALBUMIN 3.8 3.2* 2.6*   Recent Labs    05/25/18 1754  LIPASE 46   No results for input(s): AMMONIA in the last 8760 hours. CBC: Recent Labs    08/28/17 2008 05/25/18 1754 07/01/18 1215 07/01/18 1235 07/04/18 1145 07/10/18  WBC 7.3 6.8 6.1  --  8.3 8.2  NEUTROABS 4.3  --   --   --  5.5  --   HGB 12.3 12.6 13.3 12.6 11.2* 11.5*  HCT 38.3  39.8 42.0 37.0 35.1* 35*  MCV 92.5 99.7 98.8  --  98.0  --   PLT 246 230 194  --  170 312   Lipid No results for input(s): CHOL, HDL, LDLCALC, TRIG in the last 8760 hours. Cardiac Enzymes: Recent Labs    07/04/18 1145  TROPONINI 0.04*   BNP: No results for input(s): BNP in the last 8760 hours. CBG: Recent Labs    07/01/18 1106  GLUCAP 91    Procedures and Imaging Studies During Stay: No results found.  Assessment/Plan:   Calculus of gallbladder with chronic cholecystitis without obstruction  Hx of kidney transplant  Essential hypertension  Thyroid disease   Patient is being discharged with the following home health services: OT/PT/Nursing   Patient is being discharged with the following durable medical equipment:  none  Patient has been advised to f/u with their PCP in 1-2 weeks to bring them up to date on their rehab  stay.  Social services at facility was responsible for arranging this appointment.  Pt was provided with a 30 day supply of prescriptions for medications and refills must be obtained from their PCP.  For controlled substances, a more limited supply may be provided adequate until PCP appointment only.  Medications have been reconciled  Time spent > 30 min;> 50% of time with patient was spent reviewing records, labs, tests and studies, counseling and developing plan of care  Inocencio Homes, MD

## 2018-07-24 ENCOUNTER — Other Ambulatory Visit: Payer: Self-pay | Admitting: Internal Medicine

## 2018-08-03 ENCOUNTER — Encounter: Payer: Self-pay | Admitting: Internal Medicine

## 2019-05-04 ENCOUNTER — Ambulatory Visit: Payer: Medicare Other | Attending: Orthopedic Surgery | Admitting: Physical Therapy

## 2019-07-21 ENCOUNTER — Other Ambulatory Visit: Payer: Self-pay | Admitting: Physician Assistant

## 2019-10-21 ENCOUNTER — Encounter: Payer: Self-pay | Admitting: Neurology

## 2019-11-17 ENCOUNTER — Ambulatory Visit: Payer: Medicare Other | Admitting: Neurology

## 2019-11-19 ENCOUNTER — Encounter: Payer: Medicare Other | Admitting: Vascular Surgery

## 2019-11-19 ENCOUNTER — Encounter (HOSPITAL_COMMUNITY): Payer: Medicare Other

## 2019-11-19 ENCOUNTER — Other Ambulatory Visit (HOSPITAL_COMMUNITY): Payer: Medicare Other

## 2019-12-29 ENCOUNTER — Observation Stay (HOSPITAL_COMMUNITY)
Admission: EM | Admit: 2019-12-29 | Discharge: 2020-01-04 | Disposition: A | Discharge status: Skilled Nursing Facility | Payer: Medicare PPO | Attending: Family Medicine | Admitting: Family Medicine

## 2019-12-29 ENCOUNTER — Other Ambulatory Visit: Payer: Self-pay

## 2019-12-29 DIAGNOSIS — N184 Chronic kidney disease, stage 4 (severe): Secondary | ICD-10-CM | POA: Insufficient documentation

## 2019-12-29 DIAGNOSIS — J449 Chronic obstructive pulmonary disease, unspecified: Secondary | ICD-10-CM | POA: Diagnosis not present

## 2019-12-29 DIAGNOSIS — Z7901 Long term (current) use of anticoagulants: Secondary | ICD-10-CM | POA: Diagnosis not present

## 2019-12-29 DIAGNOSIS — Z7952 Long term (current) use of systemic steroids: Secondary | ICD-10-CM | POA: Insufficient documentation

## 2019-12-29 DIAGNOSIS — E119 Type 2 diabetes mellitus without complications: Secondary | ICD-10-CM

## 2019-12-29 DIAGNOSIS — Z86718 Personal history of other venous thrombosis and embolism: Secondary | ICD-10-CM | POA: Insufficient documentation

## 2019-12-29 DIAGNOSIS — Y830 Surgical operation with transplant of whole organ as the cause of abnormal reaction of the patient, or of later complication, without mention of misadventure at the time of the procedure: Secondary | ICD-10-CM | POA: Diagnosis not present

## 2019-12-29 DIAGNOSIS — I130 Hypertensive heart and chronic kidney disease with heart failure and stage 1 through stage 4 chronic kidney disease, or unspecified chronic kidney disease: Secondary | ICD-10-CM | POA: Diagnosis not present

## 2019-12-29 DIAGNOSIS — Z7951 Long term (current) use of inhaled steroids: Secondary | ICD-10-CM | POA: Insufficient documentation

## 2019-12-29 DIAGNOSIS — I10 Essential (primary) hypertension: Secondary | ICD-10-CM | POA: Diagnosis present

## 2019-12-29 DIAGNOSIS — F419 Anxiety disorder, unspecified: Secondary | ICD-10-CM | POA: Insufficient documentation

## 2019-12-29 DIAGNOSIS — Z993 Dependence on wheelchair: Secondary | ICD-10-CM | POA: Insufficient documentation

## 2019-12-29 DIAGNOSIS — F329 Major depressive disorder, single episode, unspecified: Secondary | ICD-10-CM | POA: Diagnosis not present

## 2019-12-29 DIAGNOSIS — Z8673 Personal history of transient ischemic attack (TIA), and cerebral infarction without residual deficits: Secondary | ICD-10-CM | POA: Diagnosis not present

## 2019-12-29 DIAGNOSIS — E1169 Type 2 diabetes mellitus with other specified complication: Secondary | ICD-10-CM | POA: Diagnosis present

## 2019-12-29 DIAGNOSIS — R627 Adult failure to thrive: Secondary | ICD-10-CM | POA: Insufficient documentation

## 2019-12-29 DIAGNOSIS — Z79899 Other long term (current) drug therapy: Secondary | ICD-10-CM | POA: Diagnosis not present

## 2019-12-29 DIAGNOSIS — I2510 Atherosclerotic heart disease of native coronary artery without angina pectoris: Secondary | ICD-10-CM | POA: Diagnosis not present

## 2019-12-29 DIAGNOSIS — D631 Anemia in chronic kidney disease: Secondary | ICD-10-CM

## 2019-12-29 DIAGNOSIS — Z20822 Contact with and (suspected) exposure to covid-19: Secondary | ICD-10-CM | POA: Diagnosis not present

## 2019-12-29 DIAGNOSIS — E785 Hyperlipidemia, unspecified: Secondary | ICD-10-CM | POA: Insufficient documentation

## 2019-12-29 DIAGNOSIS — Z79890 Hormone replacement therapy (postmenopausal): Secondary | ICD-10-CM | POA: Insufficient documentation

## 2019-12-29 DIAGNOSIS — I5032 Chronic diastolic (congestive) heart failure: Secondary | ICD-10-CM | POA: Diagnosis not present

## 2019-12-29 DIAGNOSIS — Z940 Kidney transplant status: Secondary | ICD-10-CM

## 2019-12-29 DIAGNOSIS — E039 Hypothyroidism, unspecified: Secondary | ICD-10-CM | POA: Diagnosis not present

## 2019-12-29 DIAGNOSIS — E1122 Type 2 diabetes mellitus with diabetic chronic kidney disease: Secondary | ICD-10-CM | POA: Diagnosis not present

## 2019-12-29 DIAGNOSIS — K219 Gastro-esophageal reflux disease without esophagitis: Secondary | ICD-10-CM | POA: Insufficient documentation

## 2019-12-29 DIAGNOSIS — R509 Fever, unspecified: Secondary | ICD-10-CM

## 2019-12-29 DIAGNOSIS — R531 Weakness: Secondary | ICD-10-CM | POA: Diagnosis present

## 2019-12-29 DIAGNOSIS — R2681 Unsteadiness on feet: Secondary | ICD-10-CM | POA: Insufficient documentation

## 2019-12-29 LAB — URINALYSIS, ROUTINE W REFLEX MICROSCOPIC
Bacteria, UA: NONE SEEN
Bilirubin Urine: NEGATIVE
Glucose, UA: 50 mg/dL — AB
Ketones, ur: NEGATIVE mg/dL
Nitrite: NEGATIVE
Protein, ur: 100 mg/dL — AB
RBC / HPF: >50 RBC/hpf — ABNORMAL HIGH (ref 0–5)
Specific Gravity, Urine: 1.015 (ref 1.005–1.030)
WBC, UA: >50 WBC/hpf — ABNORMAL HIGH (ref 0–5)
pH: 5 (ref 5.0–8.0)

## 2019-12-29 LAB — CBC
HCT: 36.8 % (ref 36.0–46.0)
Hemoglobin: 12.1 g/dL (ref 12.0–15.0)
MCH: 32.4 pg (ref 26.0–34.0)
MCHC: 32.9 g/dL (ref 30.0–36.0)
MCV: 98.7 fL (ref 80.0–100.0)
Platelets: 217 10*3/uL (ref 150–400)
RBC: 3.73 MIL/uL — ABNORMAL LOW (ref 3.87–5.11)
RDW: 14.4 % (ref 11.5–15.5)
WBC: 9.5 10*3/uL (ref 4.0–10.5)
nRBC: 0 % (ref 0.0–0.2)

## 2019-12-29 MED ORDER — SODIUM CHLORIDE 0.9% FLUSH
3.0000 mL | Freq: Once | INTRAVENOUS | Status: AC
  Administered 2019-12-30: 07:00:00 3 mL via INTRAVENOUS

## 2019-12-29 NOTE — ED Triage Notes (Signed)
Per EMS, pt from home w/ family.  Pt has been lethargic and unable to get out of bed for 5 days.  Family is concerned about kidneys, has kidney apt tomorrow w/ specialist.  Alert and oriented X2, fever 102.5, given 1g Tylenol   208/94 Heart rate 92  98% RA

## 2019-12-30 ENCOUNTER — Emergency Department (HOSPITAL_COMMUNITY): Payer: Medicare PPO

## 2019-12-30 ENCOUNTER — Encounter (HOSPITAL_COMMUNITY): Payer: Self-pay | Admitting: Internal Medicine

## 2019-12-30 DIAGNOSIS — R531 Weakness: Secondary | ICD-10-CM

## 2019-12-30 LAB — BASIC METABOLIC PANEL
Anion gap: 8 (ref 5–15)
BUN: 22 mg/dL (ref 8–23)
CO2: 21 mmol/L — ABNORMAL LOW (ref 22–32)
Calcium: 9.6 mg/dL (ref 8.9–10.3)
Chloride: 110 mmol/L (ref 98–111)
Creatinine, Ser: 1.68 mg/dL — ABNORMAL HIGH (ref 0.44–1.00)
GFR calc Af Amer: 32 mL/min — ABNORMAL LOW (ref >60)
GFR calc non Af Amer: 27 mL/min — ABNORMAL LOW (ref >60)
Glucose, Bld: 149 mg/dL — ABNORMAL HIGH (ref 70–99)
Potassium: 3.4 mmol/L — ABNORMAL LOW (ref 3.5–5.1)
Sodium: 139 mmol/L (ref 135–145)

## 2019-12-30 LAB — TROPONIN I (HIGH SENSITIVITY)
Troponin I (High Sensitivity): 43 ng/L — ABNORMAL HIGH (ref <18)
Troponin I (High Sensitivity): 44 ng/L — ABNORMAL HIGH (ref <18)

## 2019-12-30 LAB — HEPATIC FUNCTION PANEL
ALT: 13 U/L (ref 0–44)
AST: 24 U/L (ref 15–41)
Albumin: 3.2 g/dL — ABNORMAL LOW (ref 3.5–5.0)
Alkaline Phosphatase: 63 U/L (ref 38–126)
Bilirubin, Direct: 0.2 mg/dL (ref 0.0–0.2)
Indirect Bilirubin: 0.8 mg/dL (ref 0.3–0.9)
Total Bilirubin: 1 mg/dL (ref 0.3–1.2)
Total Protein: 6.9 g/dL (ref 6.5–8.1)

## 2019-12-30 LAB — TSH: TSH: 2.384 u[IU]/mL (ref 0.350–4.500)

## 2019-12-30 LAB — CK: Total CK: 49 U/L (ref 38–234)

## 2019-12-30 LAB — SARS CORONAVIRUS 2 (TAT 6-24 HRS): SARS Coronavirus 2: NEGATIVE

## 2019-12-30 LAB — LACTIC ACID, PLASMA
Lactic Acid, Venous: 1.8 mmol/L (ref 0.5–1.9)
Lactic Acid, Venous: 1.1 mmol/L (ref 0.5–1.9)

## 2019-12-30 MED ORDER — AZATHIOPRINE 50 MG PO TABS
50.0000 mg | ORAL_TABLET | Freq: Two times a day (BID) | ORAL | Status: DC
  Administered 2019-12-30 – 2020-01-04 (×11): 50 mg via ORAL
  Filled 2019-12-30 (×12): qty 1

## 2019-12-30 MED ORDER — RIVAROXABAN 10 MG PO TABS
10.0000 mg | ORAL_TABLET | Freq: Every evening | ORAL | Status: DC
  Administered 2019-12-30 – 2020-01-03 (×5): 10 mg via ORAL
  Filled 2019-12-30 (×7): qty 1

## 2019-12-30 MED ORDER — CYCLOSPORINE MODIFIED (NEORAL) 100 MG PO CAPS
100.0000 mg | ORAL_CAPSULE | Freq: Two times a day (BID) | ORAL | Status: DC
  Administered 2019-12-30 – 2020-01-04 (×10): 100 mg via ORAL
  Filled 2019-12-30 (×13): qty 1

## 2019-12-30 MED ORDER — SULFAMETHOXAZOLE-TRIMETHOPRIM 400-80 MG PO TABS
1.0000 | ORAL_TABLET | ORAL | Status: DC
  Administered 2019-12-30 – 2020-01-04 (×3): 1 via ORAL
  Filled 2019-12-30 (×3): qty 1

## 2019-12-30 MED ORDER — OXYBUTYNIN CHLORIDE ER 10 MG PO TB24
10.0000 mg | ORAL_TABLET | Freq: Every day | ORAL | Status: DC
  Administered 2019-12-30 – 2020-01-04 (×5): 10 mg via ORAL
  Filled 2019-12-30 (×6): qty 1

## 2019-12-30 MED ORDER — DOCUSATE SODIUM 100 MG PO CAPS
100.0000 mg | ORAL_CAPSULE | Freq: Two times a day (BID) | ORAL | Status: DC
  Administered 2019-12-30 – 2020-01-04 (×11): 100 mg via ORAL
  Filled 2019-12-30 (×11): qty 1

## 2019-12-30 MED ORDER — MIRABEGRON ER 50 MG PO TB24
50.0000 mg | ORAL_TABLET | Freq: Every evening | ORAL | Status: DC
  Administered 2019-12-30 – 2020-01-03 (×5): 50 mg via ORAL
  Filled 2019-12-30 (×7): qty 1

## 2019-12-30 MED ORDER — CLONIDINE HCL 0.1 MG PO TABS
0.1000 mg | ORAL_TABLET | Freq: Two times a day (BID) | ORAL | Status: DC
  Administered 2019-12-30 – 2020-01-04 (×11): 0.1 mg via ORAL
  Filled 2019-12-30 (×11): qty 1

## 2019-12-30 MED ORDER — ALBUTEROL SULFATE (2.5 MG/3ML) 0.083% IN NEBU: 3.0000 mL | INHALATION_SOLUTION | Freq: Four times a day (QID) | RESPIRATORY_TRACT | Status: DC | PRN

## 2019-12-30 MED ORDER — LACTATED RINGERS IV SOLN: INTRAVENOUS | Status: AC

## 2019-12-30 MED ORDER — LORATADINE 10 MG PO TABS
10.0000 mg | ORAL_TABLET | Freq: Every day | ORAL | Status: DC
  Administered 2019-12-30 – 2020-01-04 (×6): 10 mg via ORAL
  Filled 2019-12-30 (×6): qty 1

## 2019-12-30 MED ORDER — LEVOTHYROXINE SODIUM 75 MCG PO TABS
75.0000 ug | ORAL_TABLET | Freq: Every day | ORAL | Status: DC
  Administered 2019-12-30 – 2020-01-04 (×6): 75 ug via ORAL
  Filled 2019-12-30 (×6): qty 1

## 2019-12-30 MED ORDER — ACETAMINOPHEN 650 MG RE SUPP: 650.0000 mg | Freq: Four times a day (QID) | RECTAL | Status: DC | PRN

## 2019-12-30 MED ORDER — MOMETASONE FURO-FORMOTEROL FUM 200-5 MCG/ACT IN AERO
2.0000 | INHALATION_SPRAY | Freq: Two times a day (BID) | RESPIRATORY_TRACT | Status: DC
  Administered 2020-01-01 – 2020-01-03 (×5): 2 via RESPIRATORY_TRACT
  Filled 2019-12-30 (×3): qty 8.8

## 2019-12-30 MED ORDER — ONDANSETRON HCL 4 MG/2ML IJ SOLN
4.0000 mg | Freq: Four times a day (QID) | INTRAMUSCULAR | Status: DC | PRN
  Administered 2019-12-30: 14:00:00 4 mg via INTRAVENOUS
  Filled 2019-12-30 (×2): qty 2

## 2019-12-30 MED ORDER — ACETAMINOPHEN 325 MG PO TABS
650.0000 mg | ORAL_TABLET | Freq: Four times a day (QID) | ORAL | Status: DC | PRN
  Administered 2019-12-31 – 2020-01-03 (×2): 650 mg via ORAL
  Filled 2019-12-30 (×2): qty 2

## 2019-12-30 MED ORDER — LACTATED RINGERS IV BOLUS
500.0000 mL | Freq: Once | INTRAVENOUS | Status: AC
  Administered 2019-12-30: 11:00:00 500 mL via INTRAVENOUS

## 2019-12-30 MED ORDER — HYDROCORTISONE NA SUCCINATE PF 100 MG IJ SOLR
50.0000 mg | Freq: Four times a day (QID) | INTRAMUSCULAR | Status: AC
  Administered 2019-12-30 – 2019-12-31 (×3): 50 mg via INTRAVENOUS
  Filled 2019-12-30 (×3): qty 1

## 2019-12-30 MED ORDER — ONDANSETRON HCL 4 MG PO TABS
4.0000 mg | ORAL_TABLET | Freq: Four times a day (QID) | ORAL | Status: DC | PRN
  Administered 2019-12-31: 12:00:00 4 mg via ORAL
  Filled 2019-12-30: qty 1

## 2019-12-30 MED ORDER — FAMOTIDINE 20 MG PO TABS
20.0000 mg | ORAL_TABLET | Freq: Every day | ORAL | Status: DC
  Administered 2019-12-30 – 2020-01-03 (×5): 20 mg via ORAL
  Filled 2019-12-30 (×5): qty 1

## 2019-12-30 MED ORDER — FLUOXETINE HCL 20 MG PO CAPS
20.0000 mg | ORAL_CAPSULE | Freq: Every day | ORAL | Status: DC
  Administered 2019-12-30 – 2020-01-04 (×6): 20 mg via ORAL
  Filled 2019-12-30 (×6): qty 1

## 2019-12-30 NOTE — ED Notes (Signed)
Transported to CT 

## 2019-12-30 NOTE — ED Notes (Signed)
5N unable to take report at this time.

## 2019-12-30 NOTE — Evaluation (Signed)
Occupational Therapy Evaluation Patient Details Name: Patricia [name] MRN: [id number] DOB: April 04, 1932 Today's Date: 12/30/2019    History of Present Illness Pt is an 86 y/o female admitted secondary to fever and generalized weakness. Workup pending. PMH includes asthma, kidney transplant, COPD, CAD, DVT, pre diabetes, HTN, and s/p hemi hip arthroplasty.    Clinical Impression   Pt admitted with above diagnoses, presenting with decreased functional mobility, generalized weakness and cognitive deficits. Unsure of exact PLOF because pt is poor historian. Supine <> sit completed with max A +2 due to spinal pain and sit <> stand completed with mod A +2. Pt only able to take 1-2 very small side steps up in bed. Given current status, recommend pt d/c to SNF for continued progression of strength and BADL prior to d/c home. Will continue to follow per POC listed below.    Follow Up Recommendations  SNF;Supervision/Assistance - 24 hour    Equipment Recommendations  Other (comment)(TBD)    Recommendations for Other Services       Precautions / Restrictions Precautions Precautions: Fall Restrictions Weight Bearing Restrictions: No      Mobility Bed Mobility Overal bed mobility: Needs Assistance Bed Mobility: Supine to Sit;Sit to Supine     Supine to sit: Max assist;+2 for physical assistance Sit to supine: Max assist;+2 for physical assistance   General bed mobility comments: Max +2 for LE and trunk assist to come to sitting. Required use of bed pad to assist as pt reporting increased back pain.   Transfers Overall transfer level: Needs assistance Equipment used: 2 person hand held assist Transfers: Sit to/from Stand Sit to Stand: Mod assist;+2 physical assistance         General transfer comment: Mod A +2 for lift assist and steadying. Pt with slight posterior lean in standing.     Balance Overall balance assessment: Needs assistance Sitting-balance support: Feet  supported;Bilateral upper extremity supported Sitting balance-Leahy Scale: Poor Sitting balance - Comments: At least min guard for balance to min A   Standing balance support: Bilateral upper extremity supported;During functional activity Standing balance-Leahy Scale: Poor Standing balance comment: Reliant on UE and external support                            ADL either performed or assessed with clinical judgement   ADL Overall ADL's : Needs assistance/impaired Eating/Feeding: Set up;Sitting   Grooming: Set up;Sitting   Upper Body Bathing: Maximal assistance;Sitting   Lower Body Bathing: Maximal assistance;Sit to/from stand   Upper Body Dressing : Maximal assistance;Sitting   Lower Body Dressing: Maximal assistance;Sit to/from stand;Sitting/lateral leans   Toilet Transfer: Moderate assistance;+2 for physical assistance;+2 for safety/equipment   Toileting- Clothing Manipulation and Hygiene: Maximal assistance;Sit to/from stand       Functional mobility during ADLs: Moderate assistance;+2 for physical assistance;+2 for safety/equipment;Cueing for safety(small side steps up to [facility] only) General ADL Comments: pt limited by cognitive status, decreased activity tolerance, and pain     Vision Baseline Vision/History: Wears glasses Patient Visual Report: No change from baseline       Perception     Praxis      Pertinent Vitals/Pain Pain Assessment: Faces Faces Pain Scale: Hurts even more Pain Location: back Pain Descriptors / Indicators: Grimacing;Guarding;Aching;Moaning Pain Intervention(s): Limited activity within patient's tolerance;Monitored during session;Repositioned     Hand Dominance     Extremity/Trunk Assessment Upper Extremity Assessment Upper Extremity Assessment: Generalized weakness   Lower Extremity  Assessment Lower Extremity Assessment: Defer to PT evaluation RLE Deficits / Details: Difficulty taking steps with RLE vs LLE. Functional  weakness noted.    Cervical / Trunk Assessment Cervical / Trunk Assessment: Kyphotic   Communication Communication Communication: Expressive difficulties;HOH   Cognition Arousal/Alertness: Awake/alert Behavior During Therapy: Flat affect Overall Cognitive Status: No family/caregiver present to determine baseline cognitive functioning                                 General Comments: slow to respond to commands/questions. Pt appeared to be confused, asking OT to take pt home with her. Unsure of baseline cognitive function.   General Comments       Exercises     Shoulder Instructions      Home Living Family/patient expects to be discharged to:: Private residence Living Arrangements: Spouse/significant other;Children Available Help at Discharge: Family Type of Home: House Home Access: Stairs to enter Technical brewer of Steps: 4-5 Entrance Stairs-Rails: (has 1 rail but unable to state which side) Home Layout: Two level;Able to live on main level with bedroom/bathroom               Home Equipment: (unsure of home equipment. )   Additional Comments: unsure of home equipment, pt is relatively poor historian      Prior Functioning/Environment          Comments: Unsure of PLOF as pt unable to report. Per RN, pt has been in bed for about 5 days and  unable to ambulate        OT Problem List: Decreased strength;Decreased knowledge of use of DME or AE;Decreased range of motion;Decreased activity tolerance;Decreased cognition;Impaired balance (sitting and/or standing);Decreased safety awareness;Pain      OT Treatment/Interventions: Self-care/ADL training;Therapeutic exercise;Patient/family education;Balance training;Neuromuscular education;Energy conservation;Therapeutic activities;DME and/or AE instruction;Cognitive remediation/compensation    OT Goals(Current goals can be found in the care plan section) Acute Rehab OT Goals Patient Stated Goal: to  go home OT Goal Formulation: With patient Time For Goal Achievement: 01/13/20 Potential to Achieve Goals: Good  OT Frequency: Min 2X/week   Barriers to D/C:            Co-evaluation PT/OT/SLP Co-Evaluation/Treatment: Yes Reason for Co-Treatment: Necessary to address cognition/behavior during functional activity;To address functional/ADL transfers PT goals addressed during session: Mobility/safety with mobility;Balance OT goals addressed during session: ADL's and self-care;Strengthening/ROM      AM-PAC OT "6 Clicks" Daily Activity     Outcome Measure Help from another person eating meals?: A Little Help from another person taking care of personal grooming?: A Little Help from another person toileting, which includes using toliet, bedpan, or urinal?: A Lot Help from another person bathing (including washing, rinsing, drying)?: A Lot Help from another person to put on and taking off regular upper body clothing?: A Lot Help from another person to put on and taking off regular lower body clothing?: A Lot 6 Click Score: 14   End of Session Equipment Utilized During Treatment: Gait belt Nurse Communication: Mobility status  Activity Tolerance: Patient tolerated treatment well Patient left: with call bell/phone within reach;in bed;with bed alarm set  OT Visit Diagnosis: Unsteadiness on feet (R26.81);Other abnormalities of gait and mobility (R26.89);Muscle weakness (generalized) (M62.81);Other symptoms and signs involving cognitive function                Time: 3151-7616 OT Time Calculation (min): 17 min Charges:  OT General Charges $OT Visit: 1  Visit OT Evaluation $OT Eval Moderate Complexity: [place], MSOT, OTR/L [facility] Office Number: [phone] Pager: [phone]  Zenovia Jarred 12/30/2019, 2:23 PM

## 2019-12-30 NOTE — H&P (Signed)
History and Physical    Patricia [name]:[id number] DOB: [birth year] DOA: 12/29/2019  PCP: [name], MD Consultants:  [place] - orthopedics Patient coming from:  Home - lives with husband and son; NOK: Patricia, [name], [phone]  Chief Complaint: weakness  HPI: Patricia [name] is a 86 y.o. female with medical history significant of kidney transplant (2009); pre-DM; hypothyroidism; HTN; CAD; and mild COPD presenting with lethargy.  She reports generalized fatigue without any other pertinent history.  She has been unable to perform her usual ADLs because she has been too tired to stand.  Her husband reports that she has been in bed for over a week, not really eating much more than a liquid diet.  She was unable to eat solid foods - her teeth was part of the problem and difficulty getting out of the bed.  Prior to last week, she would eat solid foods at the table. She was able to get around in a wheelchair, mostly he pushes her.  She has been in a wheelchair for years.  He is not aware of fevers.  He is not surprised that she is being recommended for rehab - she has been in [facility] in the past and he would prefer that she return there.  She has had one of her COVID vaccinations.   ED Course:  "Generally weak".  Fevers up to 102.5 at home.  Reports weakness x 5 days.  Black stools about a week ago.  On immunosuppressants.  Kidney function ok, UA ok.  Hgb fine.  Unable to stand.  Not eating well.    Review of Systems: As per HPI; otherwise review of systems reviewed and negative.     Past Medical History:  Diagnosis Date  . Abnormal gait   . Anxiety   . Arthritis   . Asthma   . COPD, mild ([place])   . Coronary artery disease    pt denies  . Depression   . Diverticulitis   . Dizziness   . DVT (deep venous thrombosis) ([place])   . GERD (gastroesophageal reflux disease)   . Hx of kidney transplant    right  . Hypertension   . Hypothyroidism   . Multiple allergies    . Pneumonia   . Pre-diabetes   . Reflux   . Renal disorder    had kidney transplant in 2009    Past Surgical History:  Procedure Laterality Date  . ANTERIOR APPROACH HEMI HIP ARTHROPLASTY Right 02/10/2016   Procedure: TOTAL HIP ARTHROPLASTY ANTERIOR APPROACH;  Surgeon: Rod Can, MD;  Location: [place];  Service: Orthopedics;  Laterality: Right;  . CHOLECYSTECTOMY N/A 07/01/2018   Procedure: LAPAROSCOPIC CHOLECYSTECTOMY WITH INTRAOPERATIVE CHOLANGIOGRAM;  Surgeon: Jovita Kussmaul, MD;  Location: [place];  Service: General;  Laterality: N/A;  . COLONOSCOPY    . NEPHRECTOMY TRANSPLANTED ORGAN    . TUMOR REMOVAL     Brain     Social History   Socioeconomic History  . Marital status: Married    Spouse name: Patricia [name]  . Number of children: 2  . Years of education: college  . Highest education level: Not on file  Occupational History  . Occupation: retired  Tobacco Use  . Smoking status: Never Smoker  . Smokeless tobacco: Never Used  Substance and Sexual Activity  . Alcohol use: No  . Drug use: No  . Sexual activity: Not on file  Other Topics Concern  . Not on file  Social History Narrative  Patient lives at home with her husband Patricia [name]). Patient is retired. Patient has two children. Patient has college education.   Right handed.   Caffeine- None- Tea during day one cup   Social Determinants of Health   Financial Resource Strain:   . Difficulty of Paying Living Expenses:   Food Insecurity:   . Worried About Charity fundraiser in the Last Year:   . Arboriculturist in the Last Year:   Transportation Needs:   . Film/video editor (Medical):   Marland Kitchen Lack of Transportation (Non-Medical):   Physical Activity:   . Days of Exercise per Week:   . Minutes of Exercise per Session:   Stress:   . Feeling of Stress :   Social Connections:   . Frequency of Communication with Friends and Family:   . Frequency of Social Gatherings with Friends and Family:   . Attends Religious  Services:   . Active Member of Clubs or Organizations:   . Attends Archivist Meetings:   Marland Kitchen Marital Status:   Intimate Partner Violence:   . Fear of Current or Ex-Partner:   . Emotionally Abused:   Marland Kitchen Physically Abused:   . Sexually Abused:     Allergies  Allergen Reactions  . Macrolides And Ketolides Nausea Only  . Aricept [Donepezil Hcl] Nausea And Vomiting  . Codeine Nausea And Vomiting  . Morphine Nausea And Vomiting  . Penicillin G Nausea Only  . Penicillins Nausea And Vomiting and Other (See Comments)    HEADACHE Has patient had a PCN reaction causing immediate rash, facial/tongue/throat swelling, SOB or lightheadedness with hypotension: unkn Has patient had a PCN reaction causing severe rash involving mucus membranes or skin necrosis: unkn Has patient had a PCN reaction that required hospitalization: unkn Has patient had a PCN reaction occurring within the last 10 years: unkn If all of the above answers are "NO", then may proceed with Cephalosporin use.   . Statins Other (See Comments)    Doesn't remember  . Streptomycin Other (See Comments)    headache   . Tetracycline Nausea Only  . Tetracyclines & Related Rash    Family History  Problem Relation Age of Onset  . Alzheimer's disease Mother   . High Cholesterol Mother   . Alcoholism Father     Prior to Admission medications   Medication Sig Start Date End Date Taking? Authorizing Provider  albuterol (PROVENTIL HFA;VENTOLIN HFA) 108 (90 BASE) MCG/ACT inhaler Inhale 2 puffs into the lungs every 6 (six) hours as needed for wheezing or shortness of breath.    Yes [provider]  azaTHIOprine (IMURAN) 50 MG tablet Take 50 mg by mouth 2 (two) times daily.    Yes [provider]  b complex vitamins tablet Take 1 tablet by mouth daily.   Yes [provider]  budesonide-formoterol (SYMBICORT) 160-4.5 MCG/ACT inhaler Inhale 1 puff into the lungs daily as needed (shortness of breath or  wheezing).    Yes [provider]  budesonide-formoterol (SYMBICORT) 160-4.5 MCG/ACT inhaler Inhale 2 puffs into the lungs in the morning and at bedtime.   Yes [provider]  cetirizine (ZYRTEC) 10 MG tablet Take 10 mg by mouth daily.   Yes [provider]  Cholecalciferol (VITAMIN D) 2000 UNITS tablet Take 2,000 Units by mouth every evening.    Yes [provider]  cloNIDine (CATAPRES) 0.1 MG tablet Take 0.1 mg by mouth 2 (two) times daily.   Yes [provider]  cycloSPORINE modified (NEORAL) 100 MG capsule Take 100 mg by mouth 2 (two) times daily.    Yes [provider]  DIPHENHYDRAMINE HCL, TOPICAL, (BENADRYL ITCH STOPPING) 2 % GEL Apply 1 application topically daily as needed (itch relief).   Yes [provider]  famotidine (PEPCID) 20 MG tablet Take 20 mg by mouth daily with supper.   Yes [provider]  ferrous sulfate 325 (65 FE) MG tablet Take 325 mg by mouth daily with breakfast.   Yes [provider]  FLUoxetine (PROZAC) 20 MG tablet Take 20 mg by mouth daily.    Yes [provider]  fluticasone (FLONASE) 50 MCG/ACT nasal spray Place 1 spray into both nostrils daily as needed for allergies or rhinitis.   Yes [provider]  Glucosamine-Chondroitin (MOVE FREE PO) Take 1 tablet by mouth every evening.   Yes [provider]  L-Lysine 500 MG TABS Take 500 mg by mouth daily.   Yes [provider]  levothyroxine (SYNTHROID, LEVOTHROID) 75 MCG tablet Take 75 mcg by mouth daily.   Yes [provider]  Lutein-Zeaxanthin 25-5 MG CAPS Take 1 capsule by mouth daily.   Yes [provider]  mirabegron ER (MYRBETRIQ) 50 MG TB24 tablet Take 50 mg by mouth every evening.    Yes [provider]  oxybutynin (DITROPAN-XL) 10 MG 24 hr tablet Take 10 mg by mouth daily.    Yes [provider]  phenylephrine-shark liver oil-mineral oil-petrolatum  (PREPARATION H) 0.25-3-14-71.9 % rectal ointment Place 1 application rectally 2 (two) times daily as needed for hemorrhoids.   Yes [provider]  predniSONE (DELTASONE) 5 MG tablet Take 5 mg by mouth daily with breakfast.   Yes [provider]  sulfamethoxazole-trimethoprim (BACTRIM,SEPTRA) 400-80 MG per tablet Take 1 tablet by mouth 3 (three) times a week. Monday, Wednesday, and Friday mornings   Yes [provider]  XARELTO 10 MG TABS tablet Take 10 mg by mouth every evening. 05/02/18  Yes [provider]  ondansetron (ZOFRAN) 4 MG tablet Take 1 tablet (4 mg total) by mouth every 8 (eight) hours as needed for nausea or vomiting. Patient not taking: Reported on 12/30/2019 05/26/18   Domenic Moras, PA-C    Physical Exam: Vitals:   12/30/19 1100 12/30/19 1130 12/30/19 1200 12/30/19 1609  BP: (!) 170/76 (!) 162/126 (!) 168/67 (!) 139/57  Pulse: 91 87 87 84  Resp: 20 (!) 22 19 14   Temp:      TempSrc:      SpO2: 97% 100% 98% 96%     . General:  Appears calm and comfortable and is NAD, generally weak . Eyes:  PERRL, EOMI, normal lids, iris . ENT:  grossly normal hearing, lips & tongue, mmm; edentulous on top . Neck:  no LAD, masses or thyromegaly . Cardiovascular:  RRR, no m/r/g.  . Respiratory:   CTA bilaterally with no wheezes/rales/rhonchi.  Normal respiratory effort. . Abdomen:  soft, NT, ND, NABS . Skin:  no rash or induration seen on limited exam . Musculoskeletal:  Decreased tone BUE/BLE, good ROM, no bony abnormality . Psychiatric:  blunted mood and affect, speech sparse but appropriate . Neurologic:  CN 2-12 grossly intact, moves all extremities in coordinated fashion but weakly    Radiological Exams on Admission: CT Head Wo Contrast  Result Date: 12/30/2019 CLINICAL DATA:  86 year old female with history of vertigo. EXAM: CT HEAD WITHOUT CONTRAST TECHNIQUE: Contiguous axial images were obtained from the base of the skull through  the vertex  without intravenous contrast. COMPARISON:  Head CT 05/18/2017. FINDINGS: Brain: Mild cerebral atrophy. Patchy and confluent areas of decreased attenuation are noted throughout the deep and periventricular white matter of the cerebral hemispheres bilaterally, compatible with chronic microvascular ischemic disease. Postoperative changes of left occipital craniectomy with large region of low attenuation in the left cerebellar hemisphere, similar to the prior examination, most compatible with an area of encephalomalacia/gliosis from old left cerebellar infarct. In the left occipital region there is a high attenuation structure intimately associated with the tentorium cerebelli which measures 2.7 x 1.7 x 1.3 cm (axial image 13 of series 3 and coronal image 52 of series 5), similar to the prior study, most compatible with a meningioma. No evidence of acute infarction, hemorrhage, hydrocephalus or extra-axial collection. Vascular: No hyperdense vessel or unexpected calcification. Skull: Status post right occipital craniectomy. Negative for fracture or focal lesion. Bilateral mastoid effusions, unchanged. Sinuses/Orbits: No acute finding. Other: None. IMPRESSION: 1. No acute intracranial abnormalities. 2. Postoperative changes of left occipital craniectomy with chronic encephalomalacia/gliosis in the left cerebellar hemisphere, similar to prior study. 3. Hyperdense lesion associated with the left tentorium cerebelli, similar to prior examinations, compatible with a calcified meningioma. 4. Mild cerebral atrophy with extensive chronic microvascular ischemic changes in the cerebral white matter, as above. 5. Bilateral mastoid effusions which appear to be chronic. Electronically Signed   By: Vinnie Langton M.D.   On: 12/30/2019 08:22   DG Chest Portable 1 View  Result Date: 12/30/2019 CLINICAL DATA:  Fever, short of breath EXAM: PORTABLE CHEST 1 VIEW COMPARISON:  07/05/2019 FINDINGS: Normal cardiac silhouette.  Hyperinflated lungs. Several calcified nodules in the LEFT lung again noted. Bilateral healed rib fractures. Degenerative change of LEFT shoulder with remote fracture. IMPRESSION: Hyperinflated lungs.  No acute findings Electronically Signed   By: Suzy Bouchard M.D.   On: 12/30/2019 08:14    EKG: Independently reviewed.  Sinus tachcyardia with rate 111; LVH; ST elevation in V1-3   Labs on Admission: I have personally reviewed the available labs and imaging studies at the time of the admission.  Pertinent labs:   K+ 3.4 Glucose 149 BUN 22/Creatinine 1.68/GFR 27 - stable Albumin 3.2 CK 49 WBC 9.5 Hgb 12.1 UA: 50 glucose, moderate Hgb, 100 protein, >50 RBC, >50 WBC Normal TSH HS troponin 43 COVID negative   Assessment/Plan Principal Problem:   Generalized weakness Active Problems:   Hx of kidney transplant   Hypothyroidism   COPD (chronic obstructive pulmonary disease) (HCC)   Diabetes type 2, controlled (HCC)   Chronic kidney disease, stage 4, severely decreased GFR (HCC)   Essential hypertension   Chronic diastolic CHF (congestive heart failure) (HCC)   Hyperlipidemia   Generalized weakness -Patient presenting with generalized weakness -This was initially presented as a significant change in baseline, but in discussion with her husband she is wheelchair dependent at baseline and lays on the couch all days most days; the only change has been in her reluctance in the last week to get out of bed and sit up in the wheelchair at the table for meals -PT/OT consults were done and she is recommended for SNF placement -Will observe for now but she appears medically stable for placement whenever that can be arranged -TOC consult placed, and her husband prefers [employer] [employer]  Stage 4 CKD in the setting of prior renal transplant -While there was some concern raised by her husband about transplant failure, her renal function appears to be stable with stage 4  CKD at this time -Her  husband posed the question of whether she was an appropriate candidate for HD, but it appears that she is too generally weak at this time to pursue this should the need arise -Further consideration is likely to depend on her progress at rehab -Continue Imuran, cyclosporine -In reviewing her medications, it appears that she was not taking her medications last week - including prednisone; it is possible that adrenal insufficiency could be contributing to her malaise and so will give stress-dosed steroids x 24 hours to see if this perks her up -Continue 3 times weekly Bactrim  HTN -Continue Catapres  HLD -She does not appear to be taking medications for this issue at this time  DM -Prior A1c 5.1, not on medications -If fasting labs indicate hyperglycemia while giving higher dosed steroids, can consider treatment - but none now  Hypothyroidism -Normal TSH -Continue Synthroid at current dose for now  Chronic diastolic CHF -3151 echo with grade 2 diastolic dysfunction -Appears to be compensated at this time  COPD -Continue Symbicort  H/o DVT -Chronic DVT on [place] in 2014 without acute DVT -Chronic but possibly worse DVT also present in 2018 -It is not clear that she needs lifelong AC without acute DVT -Continue Xarelto for now -- but she also likely needs alternative therapy given her advanced CKD   Note: This patient has been tested and is negative for the novel coronavirus COVID-19.  DVT prophylaxis: Xarelto Code Status: DNR - confirmed with paper in chart/ACPs Family Communication: None present; I spoke with the patient's husband by telephone at the time of admission. Disposition Plan: She is anticipated to d/c to SNF and does not appear to have acute medical issues requiring her to remain hospitalized at this time Consults called: PT/OT/TOC team Admission status: It is my clinical opinion that referral for OBSERVATION is reasonable and necessary in this patient based on the above  information provided. The aforementioned taken together are felt to place the patient at high risk for further clinical deterioration. However it is anticipated that the patient may be medically stable for discharge from the hospital within 24 to 48 hours.    Karmen Bongo MD [facility]   How to contact the [facility] Attending or Consulting provider [place] or covering provider during after hours [place], for this patient?  1. Check the care team in [facility] and look for a) attending/consulting TR[facility] provider listed and b) the [facility] team listed 2. Log into [email or web address] and use [place]'s universal password to access. If you do not have the password, please contact the hospital operator. 3. Locate the [facility] provider you are looking for under [facility] and page to a number that you can be directly reached. 4. If you still have difficulty reaching the provider, please page the [facility] (Director on Call) for the Hospitalists listed on amion for assistance.   12/30/2019, 5:35 PM

## 2019-12-30 NOTE — ED Notes (Signed)
Pt repositioned and transferred onto a hospital bed.

## 2019-12-30 NOTE — Progress Notes (Signed)
Pt arrived to the unit. Pt not in distress and tolerated well.

## 2019-12-30 NOTE — Evaluation (Addendum)
Physical Therapy Evaluation Patient Details Name: Patricia [name] MRN: [id number] DOB: 13-Apr-1932 Today's Date: 12/30/2019   History of Present Illness  Pt is an 86 y/o female admitted secondary to fever and generalized weakness. Workup pending. PMH includes asthma, kidney transplant, COPD, CAD, DVT, pre diabetes, HTN, and s/p hemi hip arthroplasty.   Clinical Impression  Pt admitted secondary to problem above with deficits below. Pt requiring mod to max A +2 for mobility tasks this session. Pt with difficulty taking steps, especially with RLE. Pt with cognitive deficits and very slow to respond to questions. Unsure of caregiver support or PLOF given cognitive deficits. Feel pt would benefit from SNF level therapies at d/c. Will continue to follow acutely to maximize functional mobility independence and safety.     Follow Up Recommendations SNF;Supervision/Assistance - 24 hour    Equipment Recommendations  None recommended by PT    Recommendations for Other Services       Precautions / Restrictions Precautions Precautions: Fall Restrictions Weight Bearing Restrictions: No      Mobility  Bed Mobility Overal bed mobility: Needs Assistance Bed Mobility: Supine to Sit;Sit to Supine     Supine to sit: Max assist;+2 for physical assistance Sit to supine: Max assist;+2 for physical assistance   General bed mobility comments: Max +2 for LE and trunk assist to come to sitting. Required use of bed pad to assist as pt reporting increased back pain.   Transfers Overall transfer level: Needs assistance Equipment used: 2 person hand held assist Transfers: Sit to/from Stand Sit to Stand: Mod assist;+2 physical assistance         General transfer comment: Mod A +2 for lift assist and steadying. Pt with slight posterior lean in standing.   Ambulation/Gait Ambulation/Gait assistance: Mod assist;Max assist;+2 physical assistance   Assistive device: 2 person hand held assist        General Gait Details: Mod -max A +2 for steadying to take steps at EOB. Pt with difficulty taking steps with RLE.   Stairs            Wheelchair Mobility    Modified Rankin (Stroke Patients Only)       Balance Overall balance assessment: Needs assistance Sitting-balance support: Feet supported;Bilateral upper extremity supported Sitting balance-Leahy Scale: Poor Sitting balance - Comments: At least min guard for balance    Standing balance support: Bilateral upper extremity supported;During functional activity Standing balance-Leahy Scale: Poor Standing balance comment: Reliant on UE and external support                              Pertinent Vitals/Pain Pain Assessment: Faces Faces Pain Scale: Hurts even more Pain Location: back Pain Descriptors / Indicators: Grimacing;Guarding;Aching Pain Intervention(s): Limited activity within patient's tolerance;Monitored during session;Repositioned    Home Living Family/patient expects to be discharged to:: Private residence Living Arrangements: Spouse/significant other;Children Available Help at Discharge: Family Type of Home: House Home Access: Stairs to enter Entrance Stairs-Rails: (has 1 rail but unable to state which side) Entrance Stairs-Number of Steps: 4-5 Home Layout: Two level;Able to live on main level with bedroom/bathroom Home Equipment: (unsure of home equipment. )      Prior Function           Comments: Unsure of PLOF as pt unable to report. Per RN, pt has been in bed for about 5 days and  unable to ambulate     Hand Dominance  Extremity/Trunk Assessment   Upper Extremity Assessment Upper Extremity Assessment: Defer to OT evaluation    Lower Extremity Assessment Lower Extremity Assessment: Generalized weakness;RLE deficits/detail RLE Deficits / Details: Difficulty taking steps with RLE vs LLE. Functional weakness noted.     Cervical / Trunk Assessment Cervical / Trunk  Assessment: Kyphotic  Communication   Communication: Expressive difficulties;HOH  Cognition Arousal/Alertness: Awake/alert Behavior During Therapy: Flat affect Overall Cognitive Status: No family/caregiver present to determine baseline cognitive functioning                                 General Comments: Extremely slow to respond to questions. Required increased time to process information. Noted some confusion as well. No family present, so unable to determine if pt is at baseline.       General Comments      Exercises     Assessment/Plan    PT Assessment Patient needs continued PT services  PT Problem List Decreased strength;Decreased balance;Decreased mobility;Decreased knowledge of use of DME;Decreased knowledge of precautions;Pain;Decreased activity tolerance       PT Treatment Interventions Gait training;Functional mobility training;DME instruction;Therapeutic activities;Therapeutic exercise;Balance training;Patient/family education;Cognitive remediation    PT Goals (Current goals can be found in the Care Plan section)  Acute Rehab PT Goals Patient Stated Goal: to go home PT Goal Formulation: With patient Time For Goal Achievement: 01/13/20 Potential to Achieve Goals: Fair    Frequency Min 2X/week   Barriers to discharge Other (comment) Unsure of caregiver support     Co-evaluation PT/OT/SLP Co-Evaluation/Treatment: Yes Reason for Co-Treatment: Complexity of the patient's impairments (multi-system involvement);To address functional/ADL transfers PT goals addressed during session: Mobility/safety with mobility;Balance         AM-PAC PT "6 Clicks" Mobility  Outcome Measure Help needed turning from your back to your side while in a flat bed without using bedrails?: Total Help needed moving from lying on your back to sitting on the side of a flat bed without using bedrails?: Total Help needed moving to and from a bed to a chair (including a  wheelchair)?: Total Help needed standing up from a chair using your arms (e.g., wheelchair or bedside chair)?: A Lot Help needed to walk in hospital room?: Total Help needed climbing 3-5 steps with a railing? : Total 6 Click Score: 7    End of Session   Activity Tolerance: Patient limited by pain Patient left: in bed;with call bell/phone within reach Nurse Communication: Mobility status PT Visit Diagnosis: Muscle weakness (generalized) (M62.81);Unsteadiness on feet (R26.81);Pain Pain - part of body: (back)    Time: 6468-0321 PT Time Calculation (min) (ACUTE ONLY): 17 min   Charges:   PT Evaluation $PT Eval Moderate Complexity: 1 Mod          Patricia [name], PT, DPT  Acute Rehabilitation Services  Pager: [phone] Office: [phone]   Patricia [name] 12/30/2019, 1:58 PM

## 2019-12-30 NOTE — ED Provider Notes (Signed)
[facility] EMERGENCY DEPARTMENT Provider Note   CSN: [id number] Arrival date & time: 12/29/19  2214     History Chief Complaint  Patient presents with  . Fatigue  . Fever    Patricia [name] is a 86 y.o. female. Level 5 caveat due to mild confusion. HPI Patient brought in by EMS.  Brought in around 9 hours before she was able to get a room however so was not able discussed with EMS.  Reportedly has been weak at home.  States couple weeks ago for a week she had dark stools.  States she has been more weak.  States she just feels bad.  No cough.  Has had a previous kidney transplant.  Is on anticoagulation and immunosuppression.  For EMS reportedly had a fever of 102.5 and was given Tylenol.  Awake and answers questions but may have some mild confusion.  Patient states she feels dizzy.  States she has not been moving much because when she moves around she feels more nauseous.  No diarrhea.  No abdominal pain.    Past Medical History:  Diagnosis Date  . Abnormal gait   . Anxiety   . Arthritis   . Asthma   . COPD, mild ([place])   . Coronary artery disease    pt denies  . Depression   . Diverticulitis   . Dizziness   . DVT (deep venous thrombosis) ([place])   . GERD (gastroesophageal reflux disease)   . Hx of kidney transplant    right  . Hypertension   . Hypothyroidism   . Multiple allergies   . Pneumonia   . Pre-diabetes   . Reflux   . Renal disorder    had kidney transplant in 2009  . Thyroid disease     Patient Active Problem List   Diagnosis Date Noted  . Cholecystitis with cholelithiasis 07/01/2018  . Postoperative anemia due to acute blood loss 02/15/2016  . Hyperlipidemia 02/15/2016  . Displaced fracture of right femoral neck ([place]) 02/10/2016  . Chronic diastolic CHF (congestive heart failure) ([place]) 02/08/2016  . Closed right hip fracture ([place]) 02/08/2016  . Hip fracture ([place]) 02/08/2016  . Acute diastolic CHF (congestive heart failure) ([place])  05/27/2015  . Urinary incontinence 05/27/2015  . Escherichia coli urinary tract infection   . Other emphysema ([place])   . Hypokalemia   . Hypomagnesemia   . Sepsis secondary to UTI ([place]) 05/22/2015  . DVT, lower extremity, proximal ([place]) 03/02/2014  . CKD (chronic kidney disease) stage 3, GFR 30-59 ml/min 03/02/2014  . Essential hypertension 03/02/2014  . DVT (deep venous thrombosis) ([place]) 03/01/2014  . Acute renal failure superimposed on stage 3 chronic kidney disease ([place]) 03/01/2014  . Gastroenteritis 11/22/2013  . Nausea & vomiting 11/22/2013  . Hypercalcemia 11/22/2013  . Infection due to ESBL-producing Escherichia coli 10/07/2013  . Septicemia due to E. coli ([place]) 10/07/2013  . Urinary tract infection 10/05/2013  . Sepsis ([place]) 10/05/2013  . History of renal transplant   . Hx of kidney transplant   . Hypertension   . Thyroid disease   . Multiple allergies   . Abnormal gait   . Reflux   . Diverticulitis   . Hypothyroidism   . Depression   . Anxiety   . COPD (chronic obstructive pulmonary disease) ([place])   . Dizziness   . Diabetes type 2, controlled [facility])     Past Surgical History:  Procedure Laterality Date  . ANTERIOR APPROACH HEMI HIP ARTHROPLASTY Right 02/10/2016  Procedure: TOTAL HIP ARTHROPLASTY ANTERIOR APPROACH;  Surgeon: Rod Can, MD;  Location: [place];  Service: Orthopedics;  Laterality: Right;  . CHOLECYSTECTOMY N/A 07/01/2018   Procedure: LAPAROSCOPIC CHOLECYSTECTOMY WITH INTRAOPERATIVE CHOLANGIOGRAM;  Surgeon: Jovita Kussmaul, MD;  Location: [place];  Service: General;  Laterality: N/A;  . COLONOSCOPY    . NEPHRECTOMY TRANSPLANTED ORGAN    . TUMOR REMOVAL     Brain      OB History   No obstetric history on file.     Family History  Problem Relation Age of Onset  . Alzheimer's disease Mother   . High Cholesterol Mother   . Alcoholism Father     Social History   Tobacco Use  . Smoking status: Never Smoker  . Smokeless tobacco: Never Used    Substance Use Topics  . Alcohol use: No  . Drug use: No    Home Medications Prior to Admission medications   Medication Sig Start Date End Date Taking? Authorizing Provider  albuterol (PROVENTIL HFA;VENTOLIN HFA) 108 (90 BASE) MCG/ACT inhaler Inhale 2 puffs into the lungs every 6 (six) hours as needed for wheezing or shortness of breath.    Yes [provider]  azaTHIOprine (IMURAN) 50 MG tablet Take 50 mg by mouth 2 (two) times daily.    Yes [provider]  b complex vitamins tablet Take 1 tablet by mouth daily.   Yes [provider]  budesonide-formoterol (SYMBICORT) 160-4.5 MCG/ACT inhaler Inhale 1 puff into the lungs daily as needed (shortness of breath or wheezing).    Yes [provider]  budesonide-formoterol (SYMBICORT) 160-4.5 MCG/ACT inhaler Inhale 2 puffs into the lungs in the morning and at bedtime.   Yes [provider]  cetirizine (ZYRTEC) 10 MG tablet Take 10 mg by mouth daily.   Yes [provider]  Cholecalciferol (VITAMIN D) 2000 UNITS tablet Take 2,000 Units by mouth every evening.    Yes [provider]  cloNIDine (CATAPRES) 0.1 MG tablet Take 0.1 mg by mouth 2 (two) times daily.   Yes [provider]  cycloSPORINE modified (NEORAL) 100 MG capsule Take 100 mg by mouth 2 (two) times daily.    Yes [provider]  DIPHENHYDRAMINE HCL, TOPICAL, (BENADRYL ITCH STOPPING) 2 % GEL Apply 1 application topically daily as needed (itch relief).   Yes [provider]  famotidine (PEPCID) 20 MG tablet Take 20 mg by mouth daily with supper.   Yes [provider]  ferrous sulfate 325 (65 FE) MG tablet Take 325 mg by mouth daily with breakfast.   Yes [provider]  FLUoxetine (PROZAC) 20 MG tablet Take 20 mg by mouth daily.    Yes [provider]  fluticasone (FLONASE) 50 MCG/ACT nasal spray Place 1 spray into both nostrils daily as needed for allergies or rhinitis.   Yes  [provider]  Glucosamine-Chondroitin (MOVE FREE PO) Take 1 tablet by mouth every evening.   Yes [provider]  L-Lysine 500 MG TABS Take 500 mg by mouth daily.   Yes [provider]  levothyroxine (SYNTHROID, LEVOTHROID) 75 MCG tablet Take 75 mcg by mouth daily.   Yes [provider]  Lutein-Zeaxanthin 25-5 MG CAPS Take 1 capsule by mouth daily.   Yes [provider]  mirabegron ER (MYRBETRIQ) 50 MG TB24 tablet Take 50 mg by mouth every evening.    Yes [provider]  oxybutynin (DITROPAN-XL) 10 MG 24 hr tablet Take 10 mg by mouth daily.  Yes [provider]  phenylephrine-shark liver oil-mineral oil-petrolatum (PREPARATION H) 0.25-3-14-71.9 % rectal ointment Place 1 application rectally 2 (two) times daily as needed for hemorrhoids.   Yes [provider]  predniSONE (DELTASONE) 5 MG tablet Take 5 mg by mouth daily with breakfast.   Yes [provider]  sulfamethoxazole-trimethoprim (BACTRIM,SEPTRA) 400-80 MG per tablet Take 1 tablet by mouth 3 (three) times a week. Monday, Wednesday, and Friday mornings   Yes [provider]  XARELTO 10 MG TABS tablet Take 10 mg by mouth every evening. 05/02/18  Yes [provider]  ondansetron (ZOFRAN) 4 MG tablet Take 1 tablet (4 mg total) by mouth every 8 (eight) hours as needed for nausea or vomiting. Patient not taking: Reported on 12/30/2019 05/26/18   Domenic Moras, PA-C    Allergies    Macrolides and ketolides, Aricept [donepezil hcl], Codeine, Morphine, Penicillin g, Penicillins, Statins, Streptomycin, Tetracycline, and Tetracyclines & related  Review of Systems   Review of Systems  Unable to perform ROS: Mental status change    Physical Exam Updated Vital Signs BP (!) 167/75   Pulse 92   Temp 98.7 F (37.1 C) (Oral)   Resp (!) 21   SpO2 98%   Physical Exam Vitals reviewed.  HENT:     Head: Normocephalic.  Eyes:     Extraocular  Movements: Extraocular movements intact.     Pupils: Pupils are equal, round, and reactive to light.  Cardiovascular:     Rate and Rhythm: Regular rhythm.  Pulmonary:     Breath sounds: No wheezing or rhonchi.  Abdominal:     Tenderness: There is no abdominal tenderness.  Musculoskeletal:        General: No swelling.  Skin:    General: Skin is warm.     Capillary Refill: Capillary refill takes less than 2 seconds.  Neurological:     Mental Status: She is alert.     Comments: Awake and pleasant but mildly slow to answer.  Moving all extremities.  No nystagmus.     ED Results / Procedures / Treatments   Labs (all labs ordered are listed, but only abnormal results are displayed) Labs Reviewed  BASIC METABOLIC PANEL - Abnormal; Notable for the following components:      Result Value   Potassium 3.4 (*)    CO2 21 (*)    Glucose, Bld 149 (*)    Creatinine, Ser 1.68 (*)    GFR calc non Af Amer 27 (*)    GFR calc Af Amer 32 (*)    All other components within normal limits  CBC - Abnormal; Notable for the following components:   RBC 3.73 (*)    All other components within normal limits  URINALYSIS, ROUTINE W REFLEX MICROSCOPIC - Abnormal; Notable for the following components:   APPearance TURBID (*)    Glucose, UA 50 (*)    Hgb urine dipstick MODERATE (*)    Protein, ur 100 (*)    Leukocytes,Ua MODERATE (*)    RBC / HPF >50 (*)    WBC, UA >50 (*)    Non Squamous Epithelial 0-5 (*)    All other components within normal limits  HEPATIC FUNCTION PANEL - Abnormal; Notable for the following components:   Albumin 3.2 (*)    All other components within normal limits  TROPONIN I (HIGH SENSITIVITY) - Abnormal; Notable for the following components:   Troponin I (High Sensitivity) 43 (*)    All other components within normal  limits  CULTURE, BLOOD (ROUTINE X 2)  CULTURE, BLOOD (ROUTINE X 2)  URINE CULTURE  SARS CORONAVIRUS 2 (TAT 6-24 HRS)  CK  LACTIC ACID, PLASMA  TSH  LACTIC  ACID, PLASMA  CYCLOSPORINE  CBG MONITORING, ED  TROPONIN I (HIGH SENSITIVITY)    EKG EKG Interpretation  Date/Time:  Tuesday December 29 2019 22:11:04 EDT Ventricular Rate:  111 PR Interval:  150 QRS Duration: 84 QT Interval:  342 QTC Calculation: 465 R Axis:   -31 Text Interpretation: Sinus tachycardia Left axis deviation Pulmonary disease pattern Moderate voltage criteria for LVH, may be normal variant ( R in aVL , Cornell product ) Marked ST abnormality, possible inferior subendocardial injury Abnormal ECG Confirmed by Davonna Belling [phone]) on 12/30/2019 7:19:43 AM   Radiology CT Head Wo Contrast  Result Date: 12/30/2019 CLINICAL DATA:  86 year old female with history of vertigo. EXAM: CT HEAD WITHOUT CONTRAST TECHNIQUE: Contiguous axial images were obtained from the base of the skull through the vertex without intravenous contrast. COMPARISON:  Head CT 05/18/2017. FINDINGS: Brain: Mild cerebral atrophy. Patchy and confluent areas of decreased attenuation are noted throughout the deep and periventricular white matter of the cerebral hemispheres bilaterally, compatible with chronic microvascular ischemic disease. Postoperative changes of left occipital craniectomy with large region of low attenuation in the left cerebellar hemisphere, similar to the prior examination, most compatible with an area of encephalomalacia/gliosis from old left cerebellar infarct. In the left occipital region there is a high attenuation structure intimately associated with the tentorium cerebelli which measures 2.7 x 1.7 x 1.3 cm (axial image 13 of series 3 and coronal image 52 of series 5), similar to the prior study, most compatible with a meningioma. No evidence of acute infarction, hemorrhage, hydrocephalus or extra-axial collection. Vascular: No hyperdense vessel or unexpected calcification. Skull: Status post right occipital craniectomy. Negative for fracture or focal lesion. Bilateral mastoid effusions,  unchanged. Sinuses/Orbits: No acute finding. Other: None. IMPRESSION: 1. No acute intracranial abnormalities. 2. Postoperative changes of left occipital craniectomy with chronic encephalomalacia/gliosis in the left cerebellar hemisphere, similar to prior study. 3. Hyperdense lesion associated with the left tentorium cerebelli, similar to prior examinations, compatible with a calcified meningioma. 4. Mild cerebral atrophy with extensive chronic microvascular ischemic changes in the cerebral white matter, as above. 5. Bilateral mastoid effusions which appear to be chronic. Electronically Signed   By: Vinnie Langton M.D.   On: 12/30/2019 08:22   DG Chest Portable 1 View  Result Date: 12/30/2019 CLINICAL DATA:  Fever, short of breath EXAM: PORTABLE CHEST 1 VIEW COMPARISON:  07/05/2019 FINDINGS: Normal cardiac silhouette. Hyperinflated lungs. Several calcified nodules in the LEFT lung again noted. Bilateral healed rib fractures. Degenerative change of LEFT shoulder with remote fracture. IMPRESSION: Hyperinflated lungs.  No acute findings Electronically Signed   By: Suzy Bouchard M.D.   On: 12/30/2019 08:14    Procedures Procedures (including critical care time)  Medications Ordered in ED Medications  lactated ringers bolus 500 mL (has no administration in time range)  sodium chloride flush (NS) 0.9 % injection 3 mL (3 mLs Intravenous Given 12/30/19 6948)    ED Course  I have reviewed the triage vital signs and the nursing notes.  Pertinent labs & imaging results that were available during my care of the patient were reviewed by me and considered in my medical decision making (see chart for details).    MDM Rules/Calculators/A&P  Patient brought in for generalized weakness.  Post kidney transplant.  Reportedly generalized weak.  Temperature at home or with EMS of 102.5.  Afebrile here.  Is immunosuppressed however.  Urine has white cells but no bacteria.  Chest x-ray  reassuring.  Normal lactic acid.  Creatinine appears to be at baseline.  However patient too weak to ambulate.  States she has felt nauseous with moving.  Head CT stable but will admit to hospital for further work-up.  Does not appear to be in a severe sepsis at this time. Final Clinical Impression(s) / ED Diagnoses Final diagnoses:  Weakness  Fever, unspecified fever cause  Status post kidney transplant    Rx / DC Orders ED Discharge Orders    None       Davonna Belling, MD 12/30/19 1027

## 2019-12-31 DIAGNOSIS — R531 Weakness: Secondary | ICD-10-CM | POA: Diagnosis not present

## 2019-12-31 DIAGNOSIS — E1122 Type 2 diabetes mellitus with diabetic chronic kidney disease: Secondary | ICD-10-CM

## 2019-12-31 DIAGNOSIS — Z940 Kidney transplant status: Secondary | ICD-10-CM

## 2019-12-31 DIAGNOSIS — N184 Chronic kidney disease, stage 4 (severe): Secondary | ICD-10-CM

## 2019-12-31 DIAGNOSIS — D631 Anemia in chronic kidney disease: Secondary | ICD-10-CM

## 2019-12-31 DIAGNOSIS — Z862 Personal history of diseases of the blood and blood-forming organs and certain disorders involving the immune mechanism: Secondary | ICD-10-CM | POA: Insufficient documentation

## 2019-12-31 LAB — CBC
HCT: 30.2 % — ABNORMAL LOW (ref 36.0–46.0)
Hemoglobin: 10 g/dL — ABNORMAL LOW (ref 12.0–15.0)
MCH: 33.2 pg (ref 26.0–34.0)
MCHC: 33.1 g/dL (ref 30.0–36.0)
MCV: 100.3 fL — ABNORMAL HIGH (ref 80.0–100.0)
Platelets: 159 10*3/uL (ref 150–400)
RBC: 3.01 MIL/uL — ABNORMAL LOW (ref 3.87–5.11)
RDW: 14.5 % (ref 11.5–15.5)
WBC: 7 10*3/uL (ref 4.0–10.5)
nRBC: 0 % (ref 0.0–0.2)

## 2019-12-31 LAB — URINE CULTURE

## 2019-12-31 LAB — BASIC METABOLIC PANEL
Anion gap: 10 (ref 5–15)
BUN: 25 mg/dL — ABNORMAL HIGH (ref 8–23)
CO2: 22 mmol/L (ref 22–32)
Calcium: 9.4 mg/dL (ref 8.9–10.3)
Chloride: 109 mmol/L (ref 98–111)
Creatinine, Ser: 1.87 mg/dL — ABNORMAL HIGH (ref 0.44–1.00)
GFR calc Af Amer: 28 mL/min — ABNORMAL LOW (ref >60)
GFR calc non Af Amer: 24 mL/min — ABNORMAL LOW (ref >60)
Glucose, Bld: 147 mg/dL — ABNORMAL HIGH (ref 70–99)
Potassium: 3.9 mmol/L (ref 3.5–5.1)
Sodium: 141 mmol/L (ref 135–145)

## 2019-12-31 MED ORDER — PREDNISONE 5 MG PO TABS
5.0000 mg | ORAL_TABLET | Freq: Every day | ORAL | Status: DC
  Administered 2020-01-01 – 2020-01-04 (×4): 5 mg via ORAL
  Filled 2019-12-31 (×4): qty 1

## 2019-12-31 NOTE — Progress Notes (Signed)
Physical Therapy Treatment Patient Details Name: Patricia [name] MRN: [id number] DOB: [birth year] Today's Date: 12/31/2019    History of Present Illness Pt is an 86 y/o female admitted secondary to fever and generalized weakness. Workup pending. PMH includes asthma, kidney transplant, COPD, CAD, DVT, pre diabetes, HTN, and s/p hemi hip arthroplasty.     PT Comments    Pt demonstrated good progress toward PT goals. She continues to be limited secondary to fatigue and nausea. Tx focused on improved weight shifts, bed mobility, and exercise progression.  Pt demonstrated minA for bed mobility today, which was a large improvement from initial PT session.  Pt continued to be mildly self limiting performing standing exercises next to bed and side steps. Pt given hand out for supine and seated exercises verbalizing understanding.  Pt can continue to benefit from skilled PT services to address functional deficits (see below) until transition to next level of care.  Follow Up Recommendations  SNF;Supervision/Assistance - 24 hour     Equipment Recommendations  None recommended by PT    Recommendations for Other Services       Precautions / Restrictions Precautions Precautions: Fall Restrictions Weight Bearing Restrictions: No    Mobility  Bed Mobility Overal bed mobility: Modified Independent Bed Mobility: Supine to Sit;Sit to Supine     Supine to sit: Supervision Sit to supine: Supervision      Transfers Overall transfer level: Needs assistance Equipment used: Rolling walker (2 wheeled) Transfers: Sit to/from Stand Sit to Stand: Min guard;Supervision;From elevated surface         General transfer comment: MinG transfering to supervision as progressed with transition and reduced VCs for sequencing as tx continued.  Ambulation/Gait Ambulation/Gait assistance: Min assist;Mod assist(for RW management with side steps) Gait Distance (Feet): 10 Feet Assistive device: Rolling walker  (2 wheeled) Gait Pattern/deviations: Shuffle;Trunk flexed Gait velocity: decreased   General Gait Details: min-modA for RW management for side steps EOB       Balance Overall balance assessment: Needs assistance Sitting-balance support: Feet supported;Bilateral upper extremity supported Sitting balance-Leahy Scale: Fair Sitting balance - Comments: min G for balance   Standing balance support: Bilateral upper extremity supported;During functional activity Standing balance-Leahy Scale: Fair Standing balance comment: Reliant on UE and external support       Cognition Arousal/Alertness: Awake/alert Behavior During Therapy: WFL for tasks assessed/performed Overall Cognitive Status: Within Functional Limits for tasks assessed       Exercises General Exercises - Lower Extremity Straight Leg Raises: Supine;AROM;Both;10 reps Hip Flexion/Marching: AROM;Both;10 reps Heel Raises: AROM;Both;5 reps;Standing;Seated(standing progressing to seated due to inability against grav) Other Exercises Other Exercises: weight shifts x 10 laterally        Pertinent Vitals/Pain Pain Assessment: No/denies pain           PT Goals (current goals can now be found in the care plan section) Acute Rehab PT Goals Patient Stated Goal: To go to rehab to get stronger PT Goal Formulation: With patient Time For Goal Achievement: 01/13/20 Potential to Achieve Goals: Fair Progress towards PT goals: Progressing toward goals    Frequency    Min 2X/week      PT Plan Current plan remains appropriate       AM-PAC PT "6 Clicks" Mobility   Outcome Measure  Help needed turning from your back to your side while in a flat bed without using bedrails?: A Little Help needed moving from lying on your back to sitting on the side of a flat bed without  using bedrails?: A Little Help needed moving to and from a bed to a chair (including a wheelchair)?: A Little Help needed standing up from a chair using your  arms (e.g., wheelchair or bedside chair)?: A Lot Help needed to walk in hospital room?: A Lot Help needed climbing 3-5 steps with a railing? : A Lot 6 Click Score: 15    End of Session Equipment Utilized During Treatment: Gait belt Activity Tolerance: Patient limited by fatigue;Patient tolerated treatment well Patient left: in bed;with call bell/phone within reach;with bed alarm set Nurse Communication: Mobility status PT Visit Diagnosis: Muscle weakness (generalized) (M62.81);Unsteadiness on feet (R26.81);Difficulty in walking, not elsewhere classified (R26.2)     Time: 0102-7253 PT Time Calculation (min) (ACUTE ONLY): 28 min  Charges:  $Therapeutic Exercise: 8-22 mins $Therapeutic Activity: 8-22 mins                     Ann Held PT, DPT Acute Rehab [facility] P: [phone]    Zaynah Chawla A Layman Gully 12/31/2019, 5:11 PM

## 2019-12-31 NOTE — Discharge Instructions (Signed)

## 2019-12-31 NOTE — NC FL2 (Addendum)
[place] MEDICAID FL2 LEVEL OF CARE SCREENING TOOL     IDENTIFICATION  Patient Name: Patricia [name] Birthdate: May 01, 1932 Sex: female Admission Date (Current Location): 12/29/2019  [facility] and [place] Number:  Herbalist and Address:  The [place]. [facility], [place] [address], [place], [place] [address]      Provider Number: [id number]  Attending Physician Name and Address:  Samuella Cota, MD  Relative Name and Phone Number:  Jory Welke - [phone],  [phone]    Current Level of Care: Home Recommended Level of Care: [place] Prior Approval Number:    Date Approved/Denied:   PASRR Number: [id number]  Discharge Plan: SNF    Current Diagnoses: Patient Active Problem List   Diagnosis Date Noted  . Generalized weakness 12/30/2019  . Cholecystitis with cholelithiasis 07/01/2018  . Postoperative anemia due to acute blood loss 02/15/2016  . Hyperlipidemia 02/15/2016  . Displaced fracture of right femoral neck ([place]) 02/10/2016  . Chronic diastolic CHF (congestive heart failure) ([place]) 02/08/2016  . Closed right hip fracture ([place]) 02/08/2016  . Hip fracture ([place]) 02/08/2016  . Acute diastolic CHF (congestive heart failure) ([place]) 05/27/2015  . Urinary incontinence 05/27/2015  . Escherichia coli urinary tract infection   . Other emphysema ([place])   . Hypokalemia   . Hypomagnesemia   . Sepsis secondary to UTI ([place]) 05/22/2015  . DVT, lower extremity, proximal ([place]) 03/02/2014  . Chronic kidney disease, stage 4, severely decreased GFR (HCC) 03/02/2014  . Essential hypertension 03/02/2014  . DVT (deep venous thrombosis) ([place]) 03/01/2014  . Acute renal failure superimposed on stage 3 chronic kidney disease ([place]) 03/01/2014  . Gastroenteritis 11/22/2013  . Nausea & vomiting 11/22/2013  . Hypercalcemia 11/22/2013  . Infection due to ESBL-producing Escherichia coli 10/07/2013  . Septicemia due to E. coli ([place]) 10/07/2013  .  Urinary tract infection 10/05/2013  . Sepsis ([place]) 10/05/2013  . History of renal transplant   . Hx of kidney transplant   . Hypertension   . Thyroid disease   . Multiple allergies   . Abnormal gait   . Reflux   . Diverticulitis   . Hypothyroidism   . Depression   . Anxiety   . COPD (chronic obstructive pulmonary disease) ([place])   . Dizziness   . Diabetes type 2, controlled ([place])     Orientation RESPIRATION BLADDER Height & Weight     Self  Normal Continent Weight:   Height:     BEHAVIORAL SYMPTOMS/MOOD NEUROLOGICAL BOWEL NUTRITION STATUS      Continent (See discharge summary)  AMBULATORY STATUS COMMUNICATION OF NEEDS Skin   Extensive Assist Verbally Normal                       Personal Care Assistance Level of Assistance  Bathing, Dressing Bathing Assistance: Maximum assistance   Dressing Assistance: Maximum assistance     Functional Limitations Info             SPECIAL CARE FACTORS FREQUENCY  PT (By licensed PT), OT (By licensed OT)     PT Frequency: 5 times per week OT Frequency: 5 times per week            Contractures Contractures Info: Not present    Additional Factors Info  Psychotropic Code Status Info: DNR Allergies Info: statins, tetracycline, macrolides, aricept, codeine, morphine, penicillin, streptomycin, tetracycline Psychotropic Info: prozac,         Current Medications (12/31/2019):  This is  the current hospital active medication list Current Facility-Administered Medications  Medication Dose Route Frequency Provider Last Rate Last Admin  . acetaminophen (TYLENOL) tablet 650 mg  650 mg Oral Q6H PRN Karmen Bongo, MD       Or  . acetaminophen (TYLENOL) suppository 650 mg  650 mg Rectal Q6H PRN Karmen Bongo, MD      . albuterol (PROVENTIL) (2.5 MG/3ML) 0.083% nebulizer solution 3 mL  3 mL Inhalation Q6H PRN Karmen Bongo, MD      . azaTHIOprine Ilean Skill) tablet 50 mg  50 mg Oral BID Karmen Bongo, MD   50 mg at 12/31/19  1000  . cloNIDine (CATAPRES) tablet 0.1 mg  0.1 mg Oral BID Karmen Bongo, MD   0.1 mg at 12/31/19 1000  . cycloSPORINE modified (NEORAL) capsule 100 mg  100 mg Oral BID Karmen Bongo, MD   100 mg at 12/31/19 1156  . docusate sodium (COLACE) capsule 100 mg  100 mg Oral BID Karmen Bongo, MD   100 mg at 12/31/19 1000  . famotidine (PEPCID) tablet 20 mg  20 mg Oral Q supper Karmen Bongo, MD   20 mg at 12/30/19 1614  . FLUoxetine (PROZAC) capsule 20 mg  20 mg Oral Daily Karmen Bongo, MD   20 mg at 12/31/19 1000  . hydrocortisone sodium succinate (SOLU-CORTEF) 100 MG injection 50 mg  50 mg Intravenous Q6H Karmen Bongo, MD   50 mg at 12/31/19 1156  . levothyroxine (SYNTHROID) tablet 75 mcg  75 mcg Oral Daily Karmen Bongo, MD   75 mcg at 12/31/19 0547  . loratadine (CLARITIN) tablet 10 mg  10 mg Oral Daily Karmen Bongo, MD   10 mg at 12/31/19 1158  . mirabegron ER (MYRBETRIQ) tablet 50 mg  50 mg Oral QPM Karmen Bongo, MD   50 mg at 12/30/19 2252  . mometasone-formoterol (DULERA) 200-5 MCG/ACT inhaler 2 puff  2 puff Inhalation BID Karmen Bongo, MD   Stopped at 12/30/19 1406  . ondansetron (ZOFRAN) tablet 4 mg  4 mg Oral Q6H PRN Karmen Bongo, MD   4 mg at 12/31/19 1157   Or  . ondansetron (ZOFRAN) injection 4 mg  4 mg Intravenous Q6H PRN Karmen Bongo, MD   4 mg at 12/30/19 1331  . oxybutynin (DITROPAN-XL) 24 hr tablet 10 mg  10 mg Oral Daily Karmen Bongo, MD   10 mg at 12/31/19 1000  . rivaroxaban (XARELTO) tablet 10 mg  10 mg Oral QPM Karmen Bongo, MD   10 mg at 12/30/19 2253  . sulfamethoxazole-trimethoprim (BACTRIM) 400-80 MG per tablet 1 tablet  1 tablet Oral Once per day on Mon Wed Fri Yates, Jennifer, MD   1 tablet at 12/30/19 1612     Discharge Medications: Please see discharge summary for a list of discharge medications.  Relevant Imaging Results:  Relevant Lab Results:   Additional Information SS#[id number] [place], RN

## 2019-12-31 NOTE — NC FL2 (Signed)
[place] MEDICAID FL2 LEVEL OF CARE SCREENING TOOL     IDENTIFICATION  Patient Name: Patricia [name] Birthdate: [birth year] Sex: female Admission Date (Current Location): 12/29/2019  [facility] and [place] Number:  Herbalist and Address:  The [place]. [facility], [place] [address], [place], [place] [address]      Provider Number: [id number]  Attending Physician Name and Address:  Samuella Cota, MD  Relative Name and Phone Number:  Charmain Diosdado - [phone],  [phone]    Current Level of Care: Home Recommended Level of Care: [place] Prior Approval Number:    Date Approved/Denied:   PASRR Number: [id number]  Discharge Plan: SNF    Current Diagnoses: Patient Active Problem List   Diagnosis Date Noted  . Generalized weakness 12/30/2019  . Cholecystitis with cholelithiasis 07/01/2018  . Postoperative anemia due to acute blood loss 02/15/2016  . Hyperlipidemia 02/15/2016  . Displaced fracture of right femoral neck ([place]) 02/10/2016  . Chronic diastolic CHF (congestive heart failure) ([place]) 02/08/2016  . Closed right hip fracture ([place]) 02/08/2016  . Hip fracture ([place]) 02/08/2016  . Acute diastolic CHF (congestive heart failure) ([place]) 05/27/2015  . Urinary incontinence 05/27/2015  . Escherichia coli urinary tract infection   . Other emphysema ([place])   . Hypokalemia   . Hypomagnesemia   . Sepsis secondary to UTI ([place]) 05/22/2015  . DVT, lower extremity, proximal ([place]) 03/02/2014  . Chronic kidney disease, stage 4, severely decreased GFR (HCC) 03/02/2014  . Essential hypertension 03/02/2014  . DVT (deep venous thrombosis) ([place]) 03/01/2014  . Acute renal failure superimposed on stage 3 chronic kidney disease ([place]) 03/01/2014  . Gastroenteritis 11/22/2013  . Nausea & vomiting 11/22/2013  . Hypercalcemia 11/22/2013  . Infection due to ESBL-producing Escherichia coli 10/07/2013  . Septicemia due to E. coli ([place]) 10/07/2013  .  Urinary tract infection 10/05/2013  . Sepsis ([place]) 10/05/2013  . History of renal transplant   . Hx of kidney transplant   . Hypertension   . Thyroid disease   . Multiple allergies   . Abnormal gait   . Reflux   . Diverticulitis   . Hypothyroidism   . Depression   . Anxiety   . COPD (chronic obstructive pulmonary disease) ([place])   . Dizziness   . Diabetes type 2, controlled ([place])     Orientation RESPIRATION BLADDER Height & Weight     Self  Normal Continent Weight:   Height:     BEHAVIORAL SYMPTOMS/MOOD NEUROLOGICAL BOWEL NUTRITION STATUS      Continent (See discharge summary)  AMBULATORY STATUS COMMUNICATION OF NEEDS Skin   Extensive Assist Verbally Normal                       Personal Care Assistance Level of Assistance  Bathing, Dressing Bathing Assistance: Maximum assistance   Dressing Assistance: Maximum assistance     Functional Limitations Info             SPECIAL CARE FACTORS FREQUENCY  PT (By licensed PT), OT (By licensed OT)     PT Frequency: 5 times per week OT Frequency: 5 times per week            Contractures Contractures Info: Not present    Additional Factors Info  Code Status, Allergies Code Status Info: DNR Allergies Info: statins, tetracycline, macrolides, aricept, codeine, morphine, penicillin, streptomycin, tetracycline           Current Medications (12/31/2019):  This  is the current hospital active medication list Current Facility-Administered Medications  Medication Dose Route Frequency Provider Last Rate Last Admin  . acetaminophen (TYLENOL) tablet 650 mg  650 mg Oral Q6H PRN Karmen Bongo, MD       Or  . acetaminophen (TYLENOL) suppository 650 mg  650 mg Rectal Q6H PRN Karmen Bongo, MD      . albuterol (PROVENTIL) (2.5 MG/3ML) 0.083% nebulizer solution 3 mL  3 mL Inhalation Q6H PRN Karmen Bongo, MD      . azaTHIOprine Ilean Skill) tablet 50 mg  50 mg Oral BID Karmen Bongo, MD   50 mg at 12/31/19 1000  .  cloNIDine (CATAPRES) tablet 0.1 mg  0.1 mg Oral BID Karmen Bongo, MD   0.1 mg at 12/31/19 1000  . cycloSPORINE modified (NEORAL) capsule 100 mg  100 mg Oral BID Karmen Bongo, MD   100 mg at 12/31/19 1156  . docusate sodium (COLACE) capsule 100 mg  100 mg Oral BID Karmen Bongo, MD   100 mg at 12/31/19 1000  . famotidine (PEPCID) tablet 20 mg  20 mg Oral Q supper Karmen Bongo, MD   20 mg at 12/30/19 1614  . FLUoxetine (PROZAC) capsule 20 mg  20 mg Oral Daily Karmen Bongo, MD   20 mg at 12/31/19 1000  . hydrocortisone sodium succinate (SOLU-CORTEF) 100 MG injection 50 mg  50 mg Intravenous Q6H Karmen Bongo, MD   50 mg at 12/31/19 1156  . levothyroxine (SYNTHROID) tablet 75 mcg  75 mcg Oral Daily Karmen Bongo, MD   75 mcg at 12/31/19 0547  . loratadine (CLARITIN) tablet 10 mg  10 mg Oral Daily Karmen Bongo, MD   10 mg at 12/31/19 1158  . mirabegron ER (MYRBETRIQ) tablet 50 mg  50 mg Oral QPM Karmen Bongo, MD   50 mg at 12/30/19 2252  . mometasone-formoterol (DULERA) 200-5 MCG/ACT inhaler 2 puff  2 puff Inhalation BID Karmen Bongo, MD   Stopped at 12/30/19 1406  . ondansetron (ZOFRAN) tablet 4 mg  4 mg Oral Q6H PRN Karmen Bongo, MD   4 mg at 12/31/19 1157   Or  . ondansetron (ZOFRAN) injection 4 mg  4 mg Intravenous Q6H PRN Karmen Bongo, MD   4 mg at 12/30/19 1331  . oxybutynin (DITROPAN-XL) 24 hr tablet 10 mg  10 mg Oral Daily Karmen Bongo, MD   10 mg at 12/31/19 1000  . rivaroxaban (XARELTO) tablet 10 mg  10 mg Oral QPM Karmen Bongo, MD   10 mg at 12/30/19 2253  . sulfamethoxazole-trimethoprim (BACTRIM) 400-80 MG per tablet 1 tablet  1 tablet Oral Once per day on Mon Wed Fri Yates, Jennifer, MD   1 tablet at 12/30/19 1612     Discharge Medications: Please see discharge summary for a list of discharge medications.  Relevant Imaging Results:  Relevant Lab Results:   Additional Information SS#[id number] [place], RN

## 2019-12-31 NOTE — Care Management Obs Status (Signed)
[place] NOTIFICATION   Patient Details  Name: Patricia [name] MRN: [id number] Date of Birth: 09-28-1932   Medicare Observation Status Notification Given:  Yes    Curlene Labrum, RN 12/31/2019, 4:30 PM

## 2019-12-31 NOTE — Progress Notes (Signed)
PROGRESS NOTE  Patricia [name] UXN:[id number] DOB: [birth year] DOA: 12/29/2019 PCP: [name], MD  Brief History   85 year old woman PMH renal transplant 2009 presented with lethargy, generalized fatigue, inability to perform usual ADLs, failure to thrive, poor oral intake.  Wheelchair-bound bound for years.  Admitted for generalized weakness.  A & P  Generalized weakness.  Patient wheelchair dependent at baseline and lies on the couch all day.  Only change recently is reluctance to get out of bed and sit up in the wheelchair for meals. --PT/OT recommended SNF  Chronic kidney disease stage IV, status post renal transplant --Continue Bactrim prophylaxis as well as immunosuppressants --Creatinine appears stable  Anemia of CKD --Probably stable. Follow clinically.  Essential hypertension --Remained stable.  Chronic diastolic CHF --Appears compensated.  PMH DVT.  Chronic DVT on ultrasound 2014 not acute.  Chronic DVT 2018.  Unclear whether lifelong anticoagulation is indicated.  Follow-up with PCP as an outpatient.  Disposition Plan:  From: home Anticipated disposition: SNF Discussion: Patient appears stable, continue present care, anticipate transfer to SNF when authorized.  DVT prophylaxis: rivaroxaban  Code Status: DNR Family Communication: none present    Murray Hodgkins, MD  [facility] Direct contact: see [email or web address] (further directions at bottom of note if needed) 7PM-7AM contact night coverage as at bottom of note 12/31/2019, 3:50 PM  LOS: 0 days   Significant Hospital Events   . 3/17 admitted for failure to thrive   Consults:  .    Procedures:  .   Significant Diagnostic Tests:  . 3/17 CT head no acute intracranial abnormalities.   Micro Data:  .    Antimicrobials:  .   Interval History/Subjective  Feels okay today, no complaints.  Objective   Vitals:  Vitals:   12/31/19 0733 12/31/19 1407  BP: 126/74 (!) 118/59  Pulse: 75 71  Resp:  17 17  Temp: (!) 97.5 F (36.4 C) 97.8 F (36.6 C)  SpO2: 100% 99%    Exam:  Constitutional. Appears calm, comfortable. Respiratory. Clear to auscultation bilaterally. No wheezes, rales or rhonchi. Normal respiratory effort. Cardiovascular. Regular rate and rhythm. No murmur, rub or gallop. No lower extremity edema. Psychiatric. Grossly normal mood and affect. Speech fluent and appropriate.  I have personally reviewed the following:   Today's Data  . Creatinine stable, remainder BMP unremarkable . Hemoglobin probably stable  Scheduled Meds: . azaTHIOprine  50 mg Oral BID  . cloNIDine  0.1 mg Oral BID  . cycloSPORINE modified  100 mg Oral BID  . docusate sodium  100 mg Oral BID  . famotidine  20 mg Oral Q supper  . FLUoxetine  20 mg Oral Daily  . hydrocortisone sod succinate (SOLU-CORTEF) inj  50 mg Intravenous Q6H  . levothyroxine  75 mcg Oral Daily  . loratadine  10 mg Oral Daily  . mirabegron ER  50 mg Oral QPM  . mometasone-formoterol  2 puff Inhalation BID  . oxybutynin  10 mg Oral Daily  . [START ON 01/01/2020] predniSONE  5 mg Oral Q breakfast  . rivaroxaban  10 mg Oral QPM  . sulfamethoxazole-trimethoprim  1 tablet Oral Once per day on Mon Wed Fri   Continuous Infusions:  Principal Problem:   Generalized weakness Active Problems:   Hx of kidney transplant   Hypothyroidism   COPD (chronic obstructive pulmonary disease) (HCC)   Diabetes type 2, controlled (HCC)   Chronic kidney disease, stage 4, severely decreased GFR (HCC)   Essential hypertension   Chronic diastolic  CHF (congestive heart failure) (HCC)   Hyperlipidemia   Anemia in chronic kidney disease (CKD)   LOS: 0 days   How to contact the [facility] Attending or Consulting provider [place] or covering provider during after hours [place], for this patient?  1. Check the care team in [facility] and look for a) attending/consulting TRH provider listed and b) the [facility] team listed 2. Log into [email or web address] and use [facility]'s universal password to access. If you do not have the password, please contact the hospital operator. 3. Locate the [facility] provider you are looking for under [facility] and page to a number that you can be directly reached. 4. If you still have difficulty reaching the provider, please page the [facility] (Director on Call) for the Hospitalists listed on amion for assistance.

## 2019-12-31 NOTE — TOC Initial Note (Signed)
Transition of Care [facility]) - Initial/Assessment Note    Patient Details  Name: Patricia [name] MRN: [id number] Date of Birth: Apr 11, 1932  [name] CM/SW Contact:    Curlene Labrum, RN Phone Number: 12/31/2019, 12:33 PM  Clinical Narrative:                  Patricia [name] is a 86 y.o. female with medical history significant of kidney transplant (2009); pre-DM; hypothyroidism; HTN; CAD; and mild COPD presenting with lethargy.  She reports generalized fatigue without any other pertinent history.  She has been unable to perform her usual ADLs because she has been too tired to stand.  Her husband reports that she has been in bed for over a week, not really eating much more than a liquid diet.  She was unable to eat solid foods - her teeth was part of the problem and difficulty getting out of the bed.  Prior to last week, she would eat solid foods at the table. She was able to get around in a wheelchair, mostly he pushes her.  She has been in a wheelchair for years.  He is not aware of fevers.  He is not surprised that she is being recommended for rehab - she has been in [facility] in the past and he would prefer that she return there.  She has had one of her COVID vaccinations.  Spoke with the patient and offered Medicare Choice for [place] Placement.  Will proceed for SNF workup and placement - preferably [facility] as requested by the patient and family.  Patient has all needed DME at home.  No other barriers to discharge noted.  Pending medical work-up.   Expected Discharge Plan: Skilled Nursing Facility Barriers to Discharge: Continued Medical Work up, SNF Pending bed offer   Patient Goals and CMS Choice Patient states their goals for this hospitalization and ongoing recovery are:: I'm tired and I don't feel well.  Family requesting SNF with [facility] SNF. CMS Medicare.gov Compare Post Acute Care list provided to:: Patient Choice offered to / list  presented to : Patient  Expected Discharge Plan and Services Expected Discharge Plan: [place]   Discharge Planning Services: CM Consult Post Acute Care Choice: [place] Living arrangements for the past 2 months: Single Family Home                                      Prior Living Arrangements/Services Living arrangements for the past 2 months: Single Family Home Lives with:: Spouse Patient language and need for interpreter reviewed:: Yes Do you feel safe going back to the place where you live?: Yes      Need for Family Participation in Patient Care: Yes (Comment) Care giver support system in place?: Yes (comment) Current home services: DME(has all DME at home including WC and RW) Criminal Activity/Legal Involvement Pertinent to Current Situation/Hospitalization: No - Comment as needed  Activities of Daily Living Home Assistive Devices/Equipment: None ADL Screening (condition at time of admission) Patient's cognitive ability adequate to safely complete daily activities?: No Is the patient deaf or have difficulty hearing?: Yes Does the patient have difficulty seeing, even when wearing glasses/contacts?: Yes Does the patient have difficulty concentrating, remembering, or making decisions?: Yes Patient able to express need for assistance with ADLs?: No Does the patient have difficulty dressing or bathing?: Yes  Independently performs ADLs?: No Does the patient have difficulty walking or climbing stairs?: Yes Weakness of Legs: Both Weakness of Arms/Hands: Both  Permission Sought/Granted Permission sought to share information with : Case Manager Permission granted to share information with : Yes, Verbal Permission Granted     Permission granted to share info w AGENCY: SNF placement for discharge        Emotional Assessment Appearance:: Appears stated age Attitude/Demeanor/Rapport: Engaged Affect (typically observed):  Accepting Orientation: : Oriented to Self, Oriented to Place, Oriented to  Time, Oriented to Situation Alcohol / Substance Use: Not Applicable Psych Involvement: No (comment)  Admission diagnosis:  Weakness [R53.1] Generalized weakness [R53.1] Status post kidney transplant [Z94.0] Fever, unspecified fever cause [R50.9] Patient Active Problem List   Diagnosis Date Noted  . Generalized weakness 12/30/2019  . Cholecystitis with cholelithiasis 07/01/2018  . Postoperative anemia due to acute blood loss 02/15/2016  . Hyperlipidemia 02/15/2016  . Displaced fracture of right femoral neck ([place]) 02/10/2016  . Chronic diastolic CHF (congestive heart failure) ([place]) 02/08/2016  . Closed right hip fracture ([place]) 02/08/2016  . Hip fracture ([place]) 02/08/2016  . Acute diastolic CHF (congestive heart failure) ([place]) 05/27/2015  . Urinary incontinence 05/27/2015  . Escherichia coli urinary tract infection   . Other emphysema ([place])   . Hypokalemia   . Hypomagnesemia   . Sepsis secondary to UTI ([place]) 05/22/2015  . DVT, lower extremity, proximal ([place]) 03/02/2014  . Chronic kidney disease, stage 4, severely decreased GFR (HCC) 03/02/2014  . Essential hypertension 03/02/2014  . DVT (deep venous thrombosis) ([place]) 03/01/2014  . Acute renal failure superimposed on stage 3 chronic kidney disease ([place]) 03/01/2014  . Gastroenteritis 11/22/2013  . Nausea & vomiting 11/22/2013  . Hypercalcemia 11/22/2013  . Infection due to ESBL-producing Escherichia coli 10/07/2013  . Septicemia due to E. coli ([place]) 10/07/2013  . Urinary tract infection 10/05/2013  . Sepsis ([place]) 10/05/2013  . History of renal transplant   . Hx of kidney transplant   . Hypertension   . Thyroid disease   . Multiple allergies   . Abnormal gait   . Reflux   . Diverticulitis   . Hypothyroidism   . Depression   . Anxiety   . COPD (chronic obstructive pulmonary disease) ([place])   . Dizziness   . Diabetes type 2, controlled ([place])    PCP:   [name], MD Pharmacy:   [facility] [id number] - [place], [address] AT [facility] [address] [place] [address] Phone: [phone] Fax: [phone]     Social Determinants of Health ([place]) Interventions    Readmission Risk Interventions No flowsheet data found.

## 2020-01-01 ENCOUNTER — Encounter (HOSPITAL_COMMUNITY): Payer: Self-pay | Admitting: Internal Medicine

## 2020-01-01 DIAGNOSIS — E1122 Type 2 diabetes mellitus with diabetic chronic kidney disease: Secondary | ICD-10-CM | POA: Diagnosis not present

## 2020-01-01 DIAGNOSIS — R531 Weakness: Secondary | ICD-10-CM | POA: Diagnosis not present

## 2020-01-01 DIAGNOSIS — Z940 Kidney transplant status: Secondary | ICD-10-CM | POA: Diagnosis not present

## 2020-01-01 DIAGNOSIS — N184 Chronic kidney disease, stage 4 (severe): Secondary | ICD-10-CM | POA: Diagnosis not present

## 2020-01-01 MED ORDER — SODIUM CHLORIDE 0.45 % IV SOLN: INTRAVENOUS | Status: AC

## 2020-01-01 NOTE — Progress Notes (Signed)
PROGRESS NOTE  Patricia [name] FYB:[id number] DOB: Oct 22, 1931 DOA: 12/29/2019 PCP: [name], MD  Brief History   86 year old woman PMH renal transplant 2009 presented with lethargy, generalized fatigue, inability to perform usual ADLs, failure to thrive, poor oral intake.  Wheelchair-bound bound for years.  Admitted for generalized weakness.  A & P  Generalized weakness.  Patient wheelchair dependent at baseline and lies on the couch all day.  Only change recently is reluctance to get out of bed and sit up in the wheelchair for meals. --PT/OT recommended SNF.  Per social work unable to discharge until Monday.  Chronic kidney disease stage IV, status post renal transplant --Continue Bactrim prophylaxis as well as immunosuppressants --Creatinine stable on admission  Anemia of CKD --Follow-up as an outpatient  Essential hypertension --Remains stable  Chronic diastolic CHF --Appears compensated.  No significant lower extremity edema.  Trivial troponin elevation insignificant and not clear why this was checked as there was no suspicion for ACS.  PMH DVT.  Chronic DVT on ultrasound 2014 not acute.  Chronic DVT 2018.  Unclear whether lifelong anticoagulation is indicated.  Follow-up with PCP as an outpatient.  Disposition Plan:  From: home Anticipated disposition: SNF Discussion: Awaiting SNF bed and authorization.  Medically stable.  DVT prophylaxis: rivaroxaban  Code Status: DNR Family Communication: none present    Murray Hodgkins, MD  [facility] Direct contact: see [email or web address] (further directions at bottom of note if needed) 7PM-7AM contact night coverage as at bottom of note 01/01/2020, 6:05 PM  LOS: 0 days   Significant Hospital Events   . 3/17 admitted for failure to thrive   Consults:  .    Procedures:  .   Significant Diagnostic Tests:  . 3/17 CT head no acute intracranial abnormalities.   Micro Data:  .    Antimicrobials:  .   Interval  History/Subjective  Feels okay, no complaints  Objective   Vitals:  Vitals:   01/01/20 0453 01/01/20 0810  BP: (!) 148/67 (!) 149/61  Pulse: 63 61  Resp: 16 17  Temp: (!) 97.5 F (36.4 C) (!) 97.4 F (36.3 C)  SpO2: 100% 100%    Exam:  Constitutional.  Appears calm, comfortable Respiratory.  Clear to auscultation bilaterally.  No wheezes, rales or rhonchi.  Normal respiratory effort. Cardiovascular.  Regular rate and rhythm.  No murmur, rub or gallop.  No lower extremity edema. Psychiatric.  Grossly normal mood and affect.  Speech fluent and appropriate.  I have personally reviewed the following:   Today's Data  . No new data  Scheduled Meds: . azaTHIOprine  50 mg Oral BID  . cloNIDine  0.1 mg Oral BID  . cycloSPORINE modified  100 mg Oral BID  . docusate sodium  100 mg Oral BID  . famotidine  20 mg Oral Q supper  . FLUoxetine  20 mg Oral Daily  . levothyroxine  75 mcg Oral Daily  . loratadine  10 mg Oral Daily  . mirabegron ER  50 mg Oral QPM  . mometasone-formoterol  2 puff Inhalation BID  . oxybutynin  10 mg Oral Daily  . predniSONE  5 mg Oral Q breakfast  . rivaroxaban  10 mg Oral QPM  . sulfamethoxazole-trimethoprim  1 tablet Oral Once per day on Mon Wed Fri   Continuous Infusions:  Principal Problem:   Generalized weakness Active Problems:   Hx of kidney transplant   Hypothyroidism   COPD (chronic obstructive pulmonary disease) (HCC)   Diabetes type 2, controlled (  [place])   Chronic kidney disease, stage 4, severely decreased GFR (HCC)   Essential hypertension   Chronic diastolic CHF (congestive heart failure) (HCC)   Hyperlipidemia   Anemia in chronic kidney disease (CKD)   LOS: 0 days   How to contact the [facility] Attending or Consulting provider [place] or covering provider during after hours [place], for this patient?  1. Check the care team in [facility] and look for a) attending/consulting TRH provider listed and b) the [facility] team listed 2. Log into  [email or web address] and use [place]'s universal password to access. If you do not have the password, please contact the hospital operator. 3. Locate the [facility] provider you are looking for under [facility] and page to a number that you can be directly reached. 4. If you still have difficulty reaching the provider, please page the [facility] (Director on Call) for the Hospitalists listed on amion for assistance.

## 2020-01-01 NOTE — Plan of Care (Signed)
  Problem: Education: Goal: Knowledge of General Education information will improve Description Including pain rating scale, medication(s)/side effects and non-pharmacologic comfort measures Outcome: Progressing   

## 2020-01-01 NOTE — Plan of Care (Signed)

## 2020-01-01 NOTE — Progress Notes (Signed)
Pt slept most of night. Noted that she had not voided this shift. Had her sit on [facility] however she could not void. Bladder scan shows only 133cc . Encouraging fluids. On call PA notified.

## 2020-01-01 NOTE — TOC Progression Note (Signed)
Transition of Care [facility]) - Progression Note    Patient Details  Name: Patricia [name] MRN: [id number] Date of Birth: May 12, 1932  Transition of Care [facility]) CM/SW [place], RN Phone Number: 01/01/2020, 11:57 AM  Clinical Narrative:     [place] [facility], which is family's preferred facility, and they will not have a bed available until Monday.  Arby Barrette, liason for [facility] [employer] [facility] will not have a bed available until Monday 01/04/20.  [facility] is starting Ship broker.  Secure chatted the physician, Murray Hodgkins, and patient will have a COVID swab on Sunday, 01/03/20 and transfer to [place] on Monday for bed availability.  Called the husband, Patricia [name], to notify him that the patient will most probably transfer out on Monday pending bed availability.    Expected Discharge Plan: [place] Barriers to Discharge: Continued Medical Work up, SNF Pending bed offer  Expected Discharge Plan and Services Expected Discharge Plan: [facility]   Discharge Planning Services: CM Consult Post Acute Care Choice: [place] Living arrangements for the past 2 months: Single Family Home                                       Social Determinants of Health (SDOH) Interventions    Readmission Risk Interventions No flowsheet data found.

## 2020-01-02 DIAGNOSIS — N184 Chronic kidney disease, stage 4 (severe): Secondary | ICD-10-CM | POA: Diagnosis not present

## 2020-01-02 DIAGNOSIS — I5032 Chronic diastolic (congestive) heart failure: Secondary | ICD-10-CM | POA: Diagnosis not present

## 2020-01-02 DIAGNOSIS — R531 Weakness: Secondary | ICD-10-CM | POA: Diagnosis not present

## 2020-01-02 DIAGNOSIS — Z940 Kidney transplant status: Secondary | ICD-10-CM | POA: Diagnosis not present

## 2020-01-02 NOTE — Plan of Care (Signed)

## 2020-01-02 NOTE — Plan of Care (Signed)
  Problem: Safety: Goal: Ability to remain free from injury will improve Outcome: Progressing   

## 2020-01-02 NOTE — Progress Notes (Signed)
PROGRESS NOTE  GRACEY TOLLE FAO:[id number] DOB: [birth year] DOA: 12/29/2019 PCP: [name], MD  Brief History   86 year old woman PMH renal transplant 2009 presented with lethargy, generalized fatigue, inability to perform usual ADLs, failure to thrive, poor oral intake.  Wheelchair-bound bound for years.  Admitted for generalized weakness.  A & P  Generalized weakness.  Patient wheelchair dependent at baseline and lies on the couch all day.  Only change recently is reluctance to get out of bed and sit up in the wheelchair for meals. --PT/OT recommended SNF.  Per social work unable to discharge until Monday. --Continue supportive care  Chronic kidney disease stage IV, status post renal transplant --Continue Bactrim prophylaxis as well as immunosuppressants --Creatinine stable on admission  Anemia of CKD --Follow-up as an outpatient  Essential hypertension --Stable.  Continue clonidine  Chronic diastolic CHF --Appears compensated.  No significant lower extremity edema.  Trivial troponin elevation insignificant and not clear why this was checked as there was no suspicion for ACS.  PMH DVT.  Chronic DVT on ultrasound 2014 not acute.  Chronic DVT 2018.  Unclear whether lifelong anticoagulation is indicated.  Follow-up with PCP as an outpatient. --Continue Xarelto for now  Disposition Plan:  From: home Anticipated disposition: SNF Discussion: Awaiting SNF bed and authorization.  Medically stable.  DVT prophylaxis: rivaroxaban  Code Status: DNR Family Communication: none present    Murray Hodgkins, MD  [facility] Direct contact: see [email or web address] (further directions at bottom of note if needed) 7PM-7AM contact night coverage as at bottom of note 01/02/2020, 5:20 PM  LOS: 0 days   Significant Hospital Events   . 3/17 admitted for failure to thrive   Consults:  .    Procedures:  .   Significant Diagnostic Tests:  . 3/17 CT head no acute intracranial  abnormalities.   Micro Data:  .    Antimicrobials:  .   Interval History/Subjective  Overall doing okay.  Food however is too tough to chew.  Objective   Vitals:  Vitals:   01/02/20 0821 01/02/20 1230  BP: (!) 137/54 120/66  Pulse: 75 73  Resp: 18 18  Temp: 97.6 F (36.4 C) 98.2 F (36.8 C)  SpO2: 100% 100%    Exam:  Constitutional.  Appears calm and comfortable.  Eating lunch. Respiratory.  Clear to auscultation bilaterally.  No wheezes, rales or rhonchi.  Normal respiratory effort. Cardiovascular.  Regular rate and rhythm.  No murmur, rub or gallop.  No lower extremity edema. Psychiatric.  Grossly normal mood and affect.  Speech fluent and appropriate.  I have personally reviewed the following:   Today's Data  . No new data  Scheduled Meds: . azaTHIOprine  50 mg Oral BID  . cloNIDine  0.1 mg Oral BID  . cycloSPORINE modified  100 mg Oral BID  . docusate sodium  100 mg Oral BID  . famotidine  20 mg Oral Q supper  . FLUoxetine  20 mg Oral Daily  . levothyroxine  75 mcg Oral Daily  . loratadine  10 mg Oral Daily  . mirabegron ER  50 mg Oral QPM  . mometasone-formoterol  2 puff Inhalation BID  . oxybutynin  10 mg Oral Daily  . predniSONE  5 mg Oral Q breakfast  . rivaroxaban  10 mg Oral QPM  . sulfamethoxazole-trimethoprim  1 tablet Oral Once per day on Mon Wed Fri   Continuous Infusions:  Principal Problem:   Generalized weakness Active Problems:   Hx of kidney transplant  Hypothyroidism   COPD (chronic obstructive pulmonary disease) (HCC)   Diabetes type 2, controlled (HCC)   Chronic kidney disease, stage 4, severely decreased GFR (HCC)   Essential hypertension   Chronic diastolic CHF (congestive heart failure) (HCC)   Hyperlipidemia   Anemia in chronic kidney disease (CKD)   LOS: 0 days   How to contact the [facility] Attending or Consulting provider [place] or covering provider during after hours [place], for this patient?  1. Check the care team in  [facility] and look for a) attending/consulting TRH provider listed and b) the [facility] team listed 2. Log into [email or web address] and use [place]'s universal password to access. If you do not have the password, please contact the hospital operator. 3. Locate the [facility] provider you are looking for under [facility] and page to a number that you can be directly reached. 4. If you still have difficulty reaching the provider, please page the [facility] (Director on Call) for the Hospitalists listed on amion for assistance.

## 2020-01-03 DIAGNOSIS — I10 Essential (primary) hypertension: Secondary | ICD-10-CM

## 2020-01-03 DIAGNOSIS — R531 Weakness: Secondary | ICD-10-CM | POA: Diagnosis not present

## 2020-01-03 DIAGNOSIS — I5032 Chronic diastolic (congestive) heart failure: Secondary | ICD-10-CM | POA: Diagnosis not present

## 2020-01-03 DIAGNOSIS — N184 Chronic kidney disease, stage 4 (severe): Secondary | ICD-10-CM | POA: Diagnosis not present

## 2020-01-03 DIAGNOSIS — E1122 Type 2 diabetes mellitus with diabetic chronic kidney disease: Secondary | ICD-10-CM | POA: Diagnosis not present

## 2020-01-03 LAB — SARS CORONAVIRUS 2 (TAT 6-24 HRS): SARS Coronavirus 2: NEGATIVE

## 2020-01-03 NOTE — Plan of Care (Signed)
  Problem: Safety: Goal: Ability to remain free from injury will improve Outcome: Progressing   Problem: Skin Integrity: Goal: Risk for impaired skin integrity will decrease Outcome: Progressing   

## 2020-01-03 NOTE — Progress Notes (Signed)
PROGRESS NOTE  Patricia [name] BJS:[id number] DOB: 04/03/1932 DOA: 12/29/2019 PCP: [name], MD  Brief History   86 year old woman PMH renal transplant 2009 presented with lethargy, generalized fatigue, inability to perform usual ADLs, failure to thrive, poor oral intake.  Wheelchair-bound bound for years.  Admitted for generalized weakness.  A & P  Generalized weakness.  Patient wheelchair dependent at baseline and lies on the couch all day.  Only change recently is reluctance to get out of bed and sit up in the wheelchair for meals. --SNF recommended by PT/OT.  Unfortunately not able to discharge until 3/22, awaiting SNF bed.  Repeat Covid test negative.  Chronic kidney disease stage IV, status post renal transplant --Stable.  Continue Bactrim prophylaxis, prednisone, cyclosporine, azathioprine   Anemia of CKD --Follow-up as an outpatient  Essential hypertension --Overall stable.  Continue clonidine  Chronic diastolic CHF --Appears compensated. Trivial troponin elevation insignificant and not clear why this was checked as there was no suspicion for ACS.  PMH DVT.  Chronic DVT on ultrasound 2014 not acute.  Chronic DVT 2018.  Unclear whether lifelong anticoagulation is indicated.  Follow-up with PCP as an outpatient. --Continue Xarelto for now  Disposition Plan:  From: home Anticipated disposition: SNF Discussion: Awaiting SNF bed and authorization.  Remains medically stable.  DVT prophylaxis: rivaroxaban  Code Status: DNR Family Communication: none present    Murray Hodgkins, MD  [facility] Direct contact: see [email or web address] (further directions at bottom of note if needed) 7PM-7AM contact night coverage as at bottom of note 01/03/2020, 5:25 PM  LOS: 0 days   Significant Hospital Events   . 3/17 admitted for failure to thrive   Consults:  .    Procedures:  .   Significant Diagnostic Tests:  . 3/17 CT head no acute intracranial abnormalities.   Micro  Data:  .    Antimicrobials:  .   Interval History/Subjective  No new issues.  Objective   Vitals:  Vitals:   01/03/20 0807 01/03/20 0825  BP: (!) 189/70   Pulse: 66   Resp: 14   Temp: 97.7 F (36.5 C)   SpO2: 100% 98%    Exam:  Constitutional.  Appears calm and comfortable. Cardiovascular.  Regular rate and rhythm.  No murmur, rub or gallop.  No lower extremity edema. Respiratory.  Clear to auscultation bilaterally.  No wheezes, rales or rhonchi.  Normal respiratory effort. Psychiatric.  Grossly normal mood and affect.  Speech fluent and appropriate.  I have personally reviewed the following:   Today's Data  . No new data  Scheduled Meds: . azaTHIOprine  50 mg Oral BID  . cloNIDine  0.1 mg Oral BID  . cycloSPORINE modified  100 mg Oral BID  . docusate sodium  100 mg Oral BID  . famotidine  20 mg Oral Q supper  . FLUoxetine  20 mg Oral Daily  . levothyroxine  75 mcg Oral Daily  . loratadine  10 mg Oral Daily  . mirabegron ER  50 mg Oral QPM  . mometasone-formoterol  2 puff Inhalation BID  . oxybutynin  10 mg Oral Daily  . predniSONE  5 mg Oral Q breakfast  . rivaroxaban  10 mg Oral QPM  . sulfamethoxazole-trimethoprim  1 tablet Oral Once per day on Mon Wed Fri   Continuous Infusions:  Principal Problem:   Generalized weakness Active Problems:   Hx of kidney transplant   Hypothyroidism   COPD (chronic obstructive pulmonary disease) (HCC)   Diabetes type 2,  controlled ([place])   Chronic kidney disease, stage 4, severely decreased GFR (HCC)   Essential hypertension   Chronic diastolic CHF (congestive heart failure) (HCC)   Hyperlipidemia   Anemia in chronic kidney disease (CKD)   LOS: 0 days   How to contact the [facility] Attending or Consulting provider [place] or covering provider during after hours [place], for this patient?  1. Check the care team in [facility] and look for a) attending/consulting TRH provider listed and b) the [facility] team listed 2. Log into  [email or web address] and use [place]'s universal password to access. If you do not have the password, please contact the hospital operator. 3. Locate the [facility] provider you are looking for under [facility] and page to a number that you can be directly reached. 4. If you still have difficulty reaching the provider, please page the [facility] (Director on Call) for the Hospitalists listed on amion for assistance.

## 2020-01-03 NOTE — Plan of Care (Signed)

## 2020-01-04 DIAGNOSIS — Z940 Kidney transplant status: Secondary | ICD-10-CM | POA: Diagnosis not present

## 2020-01-04 DIAGNOSIS — I5032 Chronic diastolic (congestive) heart failure: Secondary | ICD-10-CM | POA: Diagnosis not present

## 2020-01-04 DIAGNOSIS — N184 Chronic kidney disease, stage 4 (severe): Secondary | ICD-10-CM | POA: Diagnosis not present

## 2020-01-04 DIAGNOSIS — R531 Weakness: Secondary | ICD-10-CM | POA: Diagnosis not present

## 2020-01-04 LAB — CULTURE, BLOOD (ROUTINE X 2)
Culture: NO GROWTH
Culture: NO GROWTH
Special Requests: ADEQUATE

## 2020-01-04 LAB — CYCLOSPORINE: Cyclosporine, LabCorp: <25 ng/mL — ABNORMAL LOW (ref 100–400)

## 2020-01-04 MED ORDER — ALPRAZOLAM 0.25 MG PO TABS
0.2500 mg | ORAL_TABLET | Freq: Once | ORAL | Status: AC
  Administered 2020-01-04: 05:00:00 0.25 mg via ORAL
  Filled 2020-01-04: qty 1

## 2020-01-04 MED ORDER — HYDRALAZINE HCL 20 MG/ML IJ SOLN
5.0000 mg | Freq: Once | INTRAMUSCULAR | Status: AC
  Administered 2020-01-04: 04:00:00 5 mg via INTRAVENOUS
  Filled 2020-01-04: qty 1

## 2020-01-04 NOTE — Discharge Summary (Signed)
Physician Discharge Summary  Patricia [name] ION:[id number] DOB: 1932/02/08 DOA: 12/29/2019  PCP: [name], MD  Admit date: 12/29/2019 Discharge date: 01/04/2020  Recommendations for Outpatient Follow-up:  1. Follow-up progress with rehab 2. Consider risk/benefit of ongoing Xarelto, defer to PCP  Follow-up Information    [name], [name], MD. Schedule an appointment as soon as possible for a visit in 4 week(s).   Specialty: Internal Medicine Contact information: [address] [place] [address] [phone]            Discharge Diagnoses: Principal diagnosis is #1 1. Generalized weakness 2. Chronic kidney disease stage IV, status post renal transplant 3. Anemia of CKD 4. Essential hypertension 5. Chronic diastolic CHF 6. PMH DVT  Discharge Condition: improved Disposition: SNF  Diet recommendation: dysphagia 2, thin liquids (difficulty chewing)  Filed Weights   01/01/20 0333  Weight: 53.2 kg    History of present illness:  86 year old woman PMH renal transplant 2009 presented with lethargy, generalized fatigue, inability to perform usual ADLs, failure to thrive, poor oral intake.  Wheelchair-bound bound for years.  Admitted for generalized weakness.  Hospital Course:  Patient was admitted for monitoring and further evaluation.  Seen by therapy with recommendation for SNF.  Comorbidities appeared stable.  Hospitalization was uncomplicated.  Individual issues as below.  Generalized weakness.  Patient wheelchair dependent at baseline and lies on the couch all day.  Only change recently is reluctance to get out of bed and sit up in the wheelchair for meals. --SNF recommended by PT/OT.    Chronic kidney disease stage IV, status post renal transplant --Creatinine appears stable.    Continue Bactrim prophylaxis, prednisone, cyclosporine, azathioprine   Anemia of CKD --Appears stable.  Follow-up as an outpatient  Essential  hypertension --Somewhat labile.  Continue clonidine.    Chronic diastolic CHF --Appears compensated.  Trivial troponin elevation insignificant and not clear why this was checked as there was no suspicion for ACS.  PMH DVT.  Chronic DVT on ultrasound 2014 not acute.  Chronic DVT 2018.  Unclear whether lifelong anticoagulation is indicated.  Follow-up with PCP as an outpatient. --Continue Xarelto for now  Today's assessment: S: Feels okay today.  No complaints.  Tolerating diet. O: Vitals:  Vitals:   01/04/20 0605 01/04/20 0818  BP: (!) 199/90 (!) 141/71  Pulse: 81 83  Resp:  17  Temp:  97.8 F (36.6 C)  SpO2: 100% 100%    Constitutional:  . Appears calm and comfortable Respiratory:  . CTA bilaterally, no w/r/r.  . Respiratory effort normal.  Cardiovascular:  . RRR, no m/r/g . No LE extremity edema   Musculoskeletal:  . RUE, LUE, RLE, LLE   . strength and tone grossly normal Psychiatric:  . Mental status o Mood, affect appropriate  Repeat SARS-CoV-2 screen for SNF placement negative  Discharge Instructions   Allergies as of 01/04/2020      Reactions   Macrolides And Ketolides Nausea Only   Aricept [donepezil Hcl] Nausea And Vomiting   Codeine Nausea And Vomiting   Morphine Nausea And Vomiting   Penicillin G Nausea Only   Penicillins Nausea And Vomiting, Other (See Comments)   HEADACHE Has patient had a PCN reaction causing immediate rash, facial/tongue/throat swelling, SOB or lightheadedness with hypotension: unkn Has patient had a PCN reaction causing severe rash involving mucus membranes or skin necrosis: unkn Has patient had a PCN reaction that required hospitalization: unkn Has patient had a PCN reaction occurring within the last 10  years: unkn If all of the above answers are "NO", then may proceed with Cephalosporin use.   Statins Other (See Comments)   Doesn't remember   Streptomycin Other (See Comments)   headache   Tetracycline Nausea Only    Tetracyclines & Related Rash      Medication List    TAKE these medications   albuterol 108 (90 Base) MCG/ACT inhaler Commonly known as: VENTOLIN HFA Inhale 2 puffs into the lungs every 6 (six) hours as needed for wheezing or shortness of breath.   azaTHIOprine 50 MG tablet Commonly known as: IMURAN Take 50 mg by mouth 2 (two) times daily.   b complex vitamins tablet Take 1 tablet by mouth daily.   Benadryl Itch Stopping 2 % Gel Generic drug: DIPHENHYDRAMINE HCL (TOPICAL) Apply 1 application topically daily as needed (itch relief).   cetirizine 10 MG tablet Commonly known as: ZYRTEC Take 10 mg by mouth daily.   cloNIDine 0.1 MG tablet Commonly known as: CATAPRES Take 0.1 mg by mouth 2 (two) times daily.   cycloSPORINE modified 100 MG capsule Commonly known as: NEORAL Take 100 mg by mouth 2 (two) times daily.   famotidine 20 MG tablet Commonly known as: PEPCID Take 20 mg by mouth daily with supper.   ferrous sulfate 325 (65 FE) MG tablet Take 325 mg by mouth daily with breakfast.   FLUoxetine 20 MG tablet Commonly known as: PROZAC Take 20 mg by mouth daily.   fluticasone 50 MCG/ACT nasal spray Commonly known as: FLONASE Place 1 spray into both nostrils daily as needed for allergies or rhinitis.   L-Lysine 500 MG Tabs Take 500 mg by mouth daily.   levothyroxine 75 MCG tablet Commonly known as: SYNTHROID Take 75 mcg by mouth daily.   Lutein-Zeaxanthin 25-5 MG Caps Take 1 capsule by mouth daily.   MOVE FREE PO Take 1 tablet by mouth every evening.   Myrbetriq 50 MG Tb24 tablet Generic drug: mirabegron ER Take 50 mg by mouth every evening.   oxybutynin 10 MG 24 hr tablet Commonly known as: DITROPAN-XL Take 10 mg by mouth daily.   phenylephrine-shark liver oil-mineral oil-petrolatum 0.25-3-14-71.9 % rectal ointment Commonly known as: PREPARATION H Place 1 application rectally 2 (two) times daily as needed for hemorrhoids.   predniSONE 5 MG  tablet Commonly known as: DELTASONE Take 5 mg by mouth daily with breakfast.   sulfamethoxazole-trimethoprim 400-80 MG tablet Commonly known as: BACTRIM Take 1 tablet by mouth 3 (three) times a week. Monday, Wednesday, and Friday mornings   Symbicort 160-4.5 MCG/ACT inhaler Generic drug: budesonide-formoterol Inhale 2 puffs into the lungs in the morning and at bedtime. What changed: Another medication with the same name was removed. Continue taking this medication, and follow the directions you see here.   Vitamin D 50 MCG (2000 UT) tablet Take 2,000 Units by mouth every evening.   Xarelto 10 MG Tabs tablet Generic drug: rivaroxaban Take 10 mg by mouth every evening.      Allergies  Allergen Reactions  . Macrolides And Ketolides Nausea Only  . Aricept [Donepezil Hcl] Nausea And Vomiting  . Codeine Nausea And Vomiting  . Morphine Nausea And Vomiting  . Penicillin G Nausea Only  . Penicillins Nausea And Vomiting and Other (See Comments)    HEADACHE Has patient had a PCN reaction causing immediate rash, facial/tongue/throat swelling, SOB or lightheadedness with hypotension: unkn Has patient had a PCN reaction causing severe rash involving mucus membranes or skin necrosis: unkn Has patient had  a PCN reaction that required hospitalization: unkn Has patient had a PCN reaction occurring within the last 10 years: unkn If all of the above answers are "NO", then may proceed with Cephalosporin use.   . Statins Other (See Comments)    Doesn't remember  . Streptomycin Other (See Comments)    headache   . Tetracycline Nausea Only  . Tetracyclines & Related Rash    The results of significant diagnostics from this hospitalization (including imaging, microbiology, ancillary and laboratory) are listed below for reference.    Significant Diagnostic Studies: CT Head Wo Contrast  Result Date: 12/30/2019 CLINICAL DATA:  86 year old female with history of vertigo. EXAM: CT HEAD WITHOUT  CONTRAST TECHNIQUE: Contiguous axial images were obtained from the base of the skull through the vertex without intravenous contrast. COMPARISON:  Head CT 05/18/2017. FINDINGS: Brain: Mild cerebral atrophy. Patchy and confluent areas of decreased attenuation are noted throughout the deep and periventricular white matter of the cerebral hemispheres bilaterally, compatible with chronic microvascular ischemic disease. Postoperative changes of left occipital craniectomy with large region of low attenuation in the left cerebellar hemisphere, similar to the prior examination, most compatible with an area of encephalomalacia/gliosis from old left cerebellar infarct. In the left occipital region there is a high attenuation structure intimately associated with the tentorium cerebelli which measures 2.7 x 1.7 x 1.3 cm (axial image 13 of series 3 and coronal image 52 of series 5), similar to the prior study, most compatible with a meningioma. No evidence of acute infarction, hemorrhage, hydrocephalus or extra-axial collection. Vascular: No hyperdense vessel or unexpected calcification. Skull: Status post right occipital craniectomy. Negative for fracture or focal lesion. Bilateral mastoid effusions, unchanged. Sinuses/Orbits: No acute finding. Other: None. IMPRESSION: 1. No acute intracranial abnormalities. 2. Postoperative changes of left occipital craniectomy with chronic encephalomalacia/gliosis in the left cerebellar hemisphere, similar to prior study. 3. Hyperdense lesion associated with the left tentorium cerebelli, similar to prior examinations, compatible with a calcified meningioma. 4. Mild cerebral atrophy with extensive chronic microvascular ischemic changes in the cerebral white matter, as above. 5. Bilateral mastoid effusions which appear to be chronic. Electronically Signed   By: Vinnie Langton M.D.   On: 12/30/2019 08:22   DG Chest Portable 1 View  Result Date: 12/30/2019 CLINICAL DATA:  Fever, short of  breath EXAM: PORTABLE CHEST 1 VIEW COMPARISON:  07/05/2019 FINDINGS: Normal cardiac silhouette. Hyperinflated lungs. Several calcified nodules in the LEFT lung again noted. Bilateral healed rib fractures. Degenerative change of LEFT shoulder with remote fracture. IMPRESSION: Hyperinflated lungs.  No acute findings Electronically Signed   By: Suzy Bouchard M.D.   On: 12/30/2019 08:14    Microbiology: Recent Results (from the past 240 hour(s))  Urine culture     Status: Abnormal   Collection Time: 12/29/19 11:21 PM   Specimen: Urine, Random  Result Value Ref Range Status   Specimen Description URINE, RANDOM  Final   Special Requests   Final    NONE Performed at [facility], [address], [place], [place] [address]    Culture MULTIPLE SPECIES PRESENT, SUGGEST RECOLLECTION (A)  Final   Report Status 12/31/2019 FINAL  Final  Culture, blood (routine x 2)     Status: None (Preliminary result)   Collection Time: 12/30/19  8:07 AM   Specimen: BLOOD  Result Value Ref Range Status   Specimen Description BLOOD RIGHT ANTECUBITAL  Final   Special Requests   Final    BOTTLES DRAWN AEROBIC AND ANAEROBIC Blood  Culture adequate volume   Culture   Final    NO GROWTH 4 DAYS Performed at [facility], [place] [address], [place], [place] [address]    Report Status PENDING  Incomplete  Culture, blood (routine x 2)     Status: None (Preliminary result)   Collection Time: 12/30/19  8:12 AM   Specimen: BLOOD  Result Value Ref Range Status   Specimen Description BLOOD LEFT ANTECUBITAL  Final   Special Requests   Final    BOTTLES DRAWN AEROBIC AND ANAEROBIC Blood Culture results may not be optimal due to an inadequate volume of blood received in culture bottles   Culture   Final    NO GROWTH 4 DAYS Performed at [facility], [place] [address], [place], [place] [address]    Report Status PENDING  Incomplete  SARS CORONAVIRUS 2 (TAT 6-24 HRS) Nasopharyngeal Nasopharyngeal Swab     Status:  None   Collection Time: 12/30/19 11:23 AM   Specimen: Nasopharyngeal Swab  Result Value Ref Range Status   SARS Coronavirus 2 NEGATIVE NEGATIVE Final    Comment: (NOTE) SARS-CoV-2 target nucleic acids are NOT DETECTED. The SARS-CoV-2 RNA is generally detectable in upper and lower respiratory specimens during the acute phase of infection. Negative results do not preclude SARS-CoV-2 infection, do not rule out co-infections with other pathogens, and should not be used as the sole basis for treatment or other patient management decisions. Negative results must be combined with clinical observations, patient history, and epidemiological information. The expected result is Negative. Fact Sheet for Patients: SugarRoll.be Fact Sheet for Healthcare Providers: https://www.woods-mathews.com/ This test is not yet approved or cleared by the [place] FDA and  has been authorized for detection and/or diagnosis of SARS-CoV-2 by FDA under an Emergency Use Authorization (EUA). This EUA will remain  in effect (meaning this test can be used) for the duration of the COVID-19 declaration under Section 56 4(b)(1) of the Act, 21 U.S.C. section 360bbb-3(b)(1), unless the authorization is terminated or revoked sooner. Performed at [facility], [place] [address] [place], [place] [address]   SARS CORONAVIRUS 2 (TAT 6-24 HRS) Nasopharyngeal Nasopharyngeal Swab     Status: None   Collection Time: 01/03/20 11:30 AM   Specimen: Nasopharyngeal Swab  Result Value Ref Range Status   SARS Coronavirus 2 NEGATIVE NEGATIVE Final    Comment: (NOTE) SARS-CoV-2 target nucleic acids are NOT DETECTED. The SARS-CoV-2 RNA is generally detectable in upper and lower respiratory specimens during the acute phase of infection. Negative results do not preclude SARS-CoV-2 infection, do not rule out co-infections with other pathogens, and should not be used as the sole basis for  treatment or other patient management decisions. Negative results must be combined with clinical observations, patient history, and epidemiological information. The expected result is Negative. Fact Sheet for Patients: SugarRoll.be Fact Sheet for Healthcare Providers: https://www.woods-mathews.com/ This test is not yet approved or cleared by the [place] FDA and  has been authorized for detection and/or diagnosis of SARS-CoV-2 by FDA under an Emergency Use Authorization (EUA). This EUA will remain  in effect (meaning this test can be used) for the duration of the COVID-19 declaration under Section 56 4(b)(1) of the Act, 21 U.S.C. section 360bbb-3(b)(1), unless the authorization is terminated or revoked sooner. Performed at [facility], [place] [address], [place], [place] [address]      Labs: Basic Metabolic Panel: Recent Labs  Lab 12/29/19 2223 12/31/19 0411  NA 139  141  K 3.4* 3.9  CL 110 109  CO2 21* 22  GLUCOSE 149* 147*  BUN 22 25*  CREATININE 1.68* 1.87*  CALCIUM 9.6 9.4   Liver Function Tests: Recent Labs  Lab 12/30/19 0758  AST 24  ALT 13  ALKPHOS 63  BILITOT 1.0  PROT 6.9  ALBUMIN 3.2*   CBC: Recent Labs  Lab 12/29/19 2223 12/31/19 0411  WBC 9.5 7.0  HGB 12.1 10.0*  HCT 36.8 30.2*  MCV 98.7 100.3*  PLT 217 159   Cardiac Enzymes: Recent Labs  Lab 12/29/19 2223  CKTOTAL 49    Principal Problem:   Generalized weakness Active Problems:   Hx of kidney transplant   Hypothyroidism   COPD (chronic obstructive pulmonary disease) (HCC)   Diabetes type 2, controlled (HCC)   Chronic kidney disease, stage 4, severely decreased GFR (HCC)   Essential hypertension   Chronic diastolic CHF (congestive heart failure) (HCC)   Hyperlipidemia   Anemia in chronic kidney disease (CKD)   Time coordinating discharge: 25 minutes  Signed:  Murray Hodgkins, MD  [facility]  01/04/2020, 10:45 AM

## 2020-01-04 NOTE — Progress Notes (Signed)
Physical Therapy Treatment Patient Details Name: Patricia [name] MRN: [id number] DOB: 1932/06/16 Today's Date: 01/04/2020    History of Present Illness Pt is an 86 y/o female admitted secondary to fever and generalized weakness. Workup pending. PMH includes asthma, kidney transplant, COPD, CAD, DVT, pre diabetes, HTN, and s/p hemi hip arthroplasty.     PT Comments    Pt is making slow progress with PT goals. She met all acute goals other than ambulation distance, being limited my nausea today, and mildly self limiting on session on Thursday secondary to pt reported fear of falling. Tx focused on there ex for UE & LE strengthening and bed mobility. She required mild increase in bed mobility assistance today secondary to fatigue/motivation level and additional VCs required for improved body mechanics and sequencing. She required intermittent assistance with sitting balance today during seated exercises. Pt continues to participate in therapy regularly and can benefit from further skilled therapy to improve patient independence to PLOF. PT will continue following acutely until transition to next level of care.      Follow Up Recommendations  SNF;Supervision/Assistance - 24 hour     Equipment Recommendations  None recommended by PT    Recommendations for Other Services       Precautions / Restrictions Precautions Precautions: Fall Restrictions Weight Bearing Restrictions: No    Mobility  Bed Mobility Overal bed mobility: Needs Assistance Bed Mobility: Supine to Sit     Supine to sit: Min assist Sit to supine: Min assist   General bed mobility comments: Pt required mild increase in assistance required today secondary to reduced motivation/self limitation due to increased nausea today.        Balance Overall balance assessment: Needs assistance Sitting-balance support: Feet supported;Bilateral upper extremity supported Sitting balance-Leahy Scale: Fair           Cognition Arousal/Alertness: Awake/alert Behavior During Therapy: WFL for tasks assessed/performed Overall Cognitive Status: Within Functional Limits for tasks assessed        Exercises General Exercises - Upper Extremity Shoulder Horizontal ADduction: Both;15 reps;Seated;Other (comment);Strengthening(low row motion with clinician resistance) Chair Push Up: Strengthening;Both;10 reps;Seated General Exercises - Lower Extremity Long Arc Quad: AROM;Both;15 reps;Seated Hip Flexion/Marching: AROM;Both;15 reps;Seated Heel Raises: AROM;Both;Standing;Seated;15 reps    General Comments General comments (skin integrity, edema, etc.): VSS throughout treatment.      Pertinent Vitals/Pain Pain Assessment: No/denies pain           PT Goals (current goals can now be found in the care plan section) Acute Rehab PT Goals Patient Stated Goal: To get stronger, and to be less nauseated PT Goal Formulation: With patient Time For Goal Achievement: 01/13/20 Potential to Achieve Goals: Fair Progress towards PT goals: Progressing toward goals    Frequency    Min 2X/week      PT Plan Current plan remains appropriate       AM-PAC PT "6 Clicks" Mobility   Outcome Measure  Help needed turning from your back to your side while in a flat bed without using bedrails?: A Little Help needed moving from lying on your back to sitting on the side of a flat bed without using bedrails?: A Little Help needed moving to and from a bed to a chair (including a wheelchair)?: A Little Help needed standing up from a chair using your arms (e.g., wheelchair or bedside chair)?: A Lot Help needed to walk in hospital room?: A Lot Help needed climbing 3-5 steps with a railing? : A Lot 6 Click Score:  15    End of Session   Activity Tolerance: Patient tolerated treatment well;Other (comment)(Treatment limited by nausea) Patient left: in bed;with call bell/phone within reach;with bed alarm set Nurse  Communication: Mobility status PT Visit Diagnosis: Muscle weakness (generalized) (M62.81);Unsteadiness on feet (R26.81);Difficulty in walking, not elsewhere classified (R26.2)     Time: 0814-4818 PT Time Calculation (min) (ACUTE ONLY): 13 min  Charges:  $Therapeutic Exercise: 8-22 mins                     Ann Held PT, DPT Acute Rehab [facility] P: [phone]    Jasemine Nawaz A Brunilda Eble 01/04/2020, 11:13 AM

## 2020-01-04 NOTE — Plan of Care (Signed)
  Problem: Education: Goal: Knowledge of General Education information will improve Description: Including pain rating scale, medication(s)/side effects and non-pharmacologic comfort measures Outcome: Progressing   Problem: Health Behavior/Discharge Planning: Goal: Ability to manage health-related needs will improve Outcome: Progressing   Problem: Clinical Measurements: Goal: Will remain free from infection Outcome: Progressing   Problem: Activity: Goal: Risk for activity intolerance will decrease Outcome: Progressing   Problem: Nutrition: Goal: Adequate nutrition will be maintained Outcome: Progressing   Problem: Coping: Goal: Level of anxiety will decrease Outcome: Progressing   Problem: Elimination: Goal: Will not experience complications related to urinary retention Outcome: Progressing   Problem: Safety: Goal: Ability to remain free from injury will improve Outcome: Progressing   Problem: Skin Integrity: Goal: Risk for impaired skin integrity will decrease Outcome: Progressing

## 2020-01-04 NOTE — Plan of Care (Addendum)
Pt discharging to [facility], report given to [place], Therapist, sports at [phone]. Discharge instructions provided to pt. Packed all personal belongings. No further questions or concerns at this time. Awaiting [facility] for transportation.   Problem: Education: Goal: Knowledge of General Education information will improve Description: Including pain rating scale, medication(s)/side effects and non-pharmacologic comfort measures 01/04/2020 1434 by Stevan Born, RN Outcome: Completed/Met 01/04/2020 1018 by Stevan Born, RN Outcome: Progressing   Problem: Health Behavior/Discharge Planning: Goal: Ability to manage health-related needs will improve 01/04/2020 1434 by Stevan Born, RN Outcome: Completed/Met 01/04/2020 1018 by Stevan Born, RN Outcome: Progressing   Problem: Clinical Measurements: Goal: Ability to maintain clinical measurements within normal limits will improve Outcome: Completed/Met Goal: Will remain free from infection 01/04/2020 1434 by Stevan Born, RN Outcome: Completed/Met 01/04/2020 1018 by Stevan Born, RN Outcome: Progressing Goal: Diagnostic test results will improve Outcome: Completed/Met Goal: Respiratory complications will improve Outcome: Completed/Met Goal: Cardiovascular complication will be avoided Outcome: Completed/Met   Problem: Activity: Goal: Risk for activity intolerance will decrease 01/04/2020 1434 by Stevan Born, RN Outcome: Completed/Met 01/04/2020 1018 by Stevan Born, RN Outcome: Progressing   Problem: Nutrition: Goal: Adequate nutrition will be maintained 01/04/2020 1434 by Stevan Born, RN Outcome: Completed/Met 01/04/2020 1018 by Stevan Born, RN Outcome: Progressing   Problem: Coping: Goal: Level of anxiety will decrease 01/04/2020 1434 by Stevan Born, RN Outcome: Completed/Met 01/04/2020 1018 by Stevan Born, RN Outcome: Progressing   Problem: Elimination: Goal: Will not experience complications  related to bowel motility Outcome: Completed/Met Goal: Will not experience complications related to urinary retention 01/04/2020 1434 by Stevan Born, RN Outcome: Completed/Met 01/04/2020 1018 by Stevan Born, RN Outcome: Progressing   Problem: Pain Managment: Goal: General experience of comfort will improve Outcome: Completed/Met   Problem: Safety: Goal: Ability to remain free from injury will improve 01/04/2020 1434 by Stevan Born, RN Outcome: Completed/Met 01/04/2020 1018 by Stevan Born, RN Outcome: Progressing   Problem: Skin Integrity: Goal: Risk for impaired skin integrity will decrease 01/04/2020 1434 by Stevan Born, RN Outcome: Completed/Met 01/04/2020 1018 by Stevan Born, RN Outcome: Progressing

## 2020-01-04 NOTE — TOC Progression Note (Addendum)
Transition of Care [facility]) - Progression Note    Patient Details  Name: Patricia [name] MRN: [id number] Date of Birth: [birth year]  Transition of Care [facility] CM/SW [place], RN Phone Number: 01/04/2020, 10:04 AM  Clinical Narrative:     Case management called [facility] facility and spoke with Arby Barrette this morning 01/04/20 - waiting to hear back from [place] Liaison to call back once approval from [employer] is granted.  01/04/20 - 06237-  Called [facility] - bed available and insurance authorization approved.  Called the husband and notified of the patient's transfer to [place] today.  [facility] called to transport and primary RN aware.    Expected Discharge Plan: [employer] Barriers to Discharge: Continued Medical Work up, SNF Pending bed offer  Expected Discharge Plan and Services Expected Discharge Plan: [place]   Discharge Planning Services: CM Consult Post Acute Care Choice: [place] Living arrangements for the past 2 months: Single Family Home                                       Social Determinants of Health (SDOH) Interventions    Readmission Risk Interventions No flowsheet data found.

## 2020-01-05 ENCOUNTER — Encounter: Payer: Self-pay | Admitting: Internal Medicine

## 2020-01-05 ENCOUNTER — Non-Acute Institutional Stay (SKILLED_NURSING_FACILITY): Payer: Medicare PPO | Admitting: Internal Medicine

## 2020-01-05 DIAGNOSIS — D631 Anemia in chronic kidney disease: Secondary | ICD-10-CM

## 2020-01-05 DIAGNOSIS — E034 Atrophy of thyroid (acquired): Secondary | ICD-10-CM | POA: Diagnosis not present

## 2020-01-05 DIAGNOSIS — F32A Depression, unspecified: Secondary | ICD-10-CM

## 2020-01-05 DIAGNOSIS — N184 Chronic kidney disease, stage 4 (severe): Secondary | ICD-10-CM | POA: Diagnosis not present

## 2020-01-05 DIAGNOSIS — Z940 Kidney transplant status: Secondary | ICD-10-CM

## 2020-01-05 DIAGNOSIS — F329 Major depressive disorder, single episode, unspecified: Secondary | ICD-10-CM

## 2020-01-05 DIAGNOSIS — R531 Weakness: Secondary | ICD-10-CM | POA: Diagnosis not present

## 2020-01-05 DIAGNOSIS — I10 Essential (primary) hypertension: Secondary | ICD-10-CM

## 2020-01-05 DIAGNOSIS — I825Y2 Chronic embolism and thrombosis of unspecified deep veins of left proximal lower extremity: Secondary | ICD-10-CM

## 2020-01-05 NOTE — Progress Notes (Signed)
: Provider:  Hennie Duos., MD Location:  [place] Room Number: [phone] Place of Service:  SNF [phone])  PCP: Hennie Duos, MD Patient Care Team: Hennie Duos, MD as PCP - General (Internal Medicine)  Extended Emergency Contact Information Primary Emergency Contact: Bitting,Howard Address: [address]          [place] Phone: [phone] Mobile Phone: [phone] Relation: Spouse Secondary Emergency Contact: Franki Cabot, [place] Mobile Phone: [phone] Relation: Son     Allergies: Macrolides and ketolides, Aricept [donepezil hcl], Codeine, Morphine, Penicillin g, Penicillins, Statins, Streptomycin, Tetracycline, and Tetracyclines & related  Chief Complaint  Patient presents with  . New Admit To SNF    New admission to [facility]     HPI: Patient is an 86 y.o. female with renal transplant 2009 who presented to [facility] with lethargy, generalized fatigue, inability to perform usual ADLs, poor oral intake, failure to thrive.  Wheelchair-bound for years.  Patient was admitted to [facility] on 3/16-22 for generalized weakness, for monitoring and further evaluation.  Comorbidities were stable and patient is admitted to skilled nursing facility for OT/PT.  While at skilled nursing facility patient will be followed for chronic kidney disease stage IV status post renal transplant treated with Bactrim prednisone cyclosporine and azathioprine, hypertension treated with clonidine and hypothyroidism treated with replacement.  Past Medical History:  Diagnosis Date  . Abnormal gait   . Anxiety   . Arthritis   . Asthma   . COPD, mild ([place])   . Coronary artery disease    pt denies  . Depression   . Diverticulitis   . Dizziness   . DVT (deep venous thrombosis) ([place])   . GERD (gastroesophageal reflux disease)   . Hx of kidney transplant    right  .  Hypertension   . Hypothyroidism   . Multiple allergies   . Pneumonia   . Pre-diabetes   . Reflux   . Renal disorder    had kidney transplant in 2009    Past Surgical History:  Procedure Laterality Date  . ANTERIOR APPROACH HEMI HIP ARTHROPLASTY Right 02/10/2016   Procedure: TOTAL HIP ARTHROPLASTY ANTERIOR APPROACH;  Surgeon: Rod Can, MD;  Location: [place];  Service: Orthopedics;  Laterality: Right;  . CHOLECYSTECTOMY N/A 07/01/2018   Procedure: LAPAROSCOPIC CHOLECYSTECTOMY WITH INTRAOPERATIVE CHOLANGIOGRAM;  Surgeon: Jovita Kussmaul, MD;  Location: [place];  Service: General;  Laterality: N/A;  . COLONOSCOPY    . NEPHRECTOMY TRANSPLANTED ORGAN    . TUMOR REMOVAL     Brain     Allergies as of 01/05/2020      Reactions   Macrolides And Ketolides Nausea Only   Aricept [donepezil Hcl] Nausea And Vomiting   Codeine Nausea And Vomiting   Morphine Nausea And Vomiting   Penicillin G Nausea Only   Penicillins Nausea And Vomiting, Other (See Comments)   HEADACHE Has patient had a PCN reaction causing immediate rash, facial/tongue/throat swelling, SOB or lightheadedness with hypotension: unkn Has patient had a PCN reaction causing severe rash involving mucus membranes or skin necrosis: unkn Has patient had a PCN reaction that required hospitalization: unkn Has patient had a PCN reaction occurring within the last 10 years: unkn If all of the above answers are "NO", then may proceed with Cephalosporin use.   Statins Other (See Comments)  Doesn't remember   Streptomycin Other (See Comments)   headache   Tetracycline Nausea Only   Tetracyclines & Related Rash      Medication List       Accurate as of January 05, 2020 11:38 AM. If you have any questions, ask your nurse or doctor.        albuterol 108 (90 Base) MCG/ACT inhaler Commonly known as: VENTOLIN HFA Inhale 2 puffs into the lungs every 6 (six) hours as needed for wheezing or shortness of breath.   azaTHIOprine 50 MG  tablet Commonly known as: IMURAN Take 50 mg by mouth 2 (two) times daily.   b complex vitamins tablet Take 1 tablet by mouth daily.   Benadryl Itch Stopping 2 % Gel Generic drug: DIPHENHYDRAMINE HCL (TOPICAL) Apply 1 application topically daily as needed (itch relief).   cetirizine 10 MG tablet Commonly known as: ZYRTEC Take 10 mg by mouth daily.   cloNIDine 0.1 MG tablet Commonly known as: CATAPRES Take 0.1 mg by mouth 2 (two) times daily.   cycloSPORINE modified 100 MG capsule Commonly known as: NEORAL Take 100 mg by mouth 2 (two) times daily.   famotidine 20 MG tablet Commonly known as: PEPCID Take 20 mg by mouth daily with supper.   ferrous sulfate 325 (65 FE) MG tablet Take 325 mg by mouth daily with breakfast.   FLUoxetine 20 MG tablet Commonly known as: PROZAC Take 20 mg by mouth daily.   fluticasone 50 MCG/ACT nasal spray Commonly known as: FLONASE Place 1 spray into both nostrils daily as needed for allergies or rhinitis.   L-Lysine 500 MG Tabs Take 500 mg by mouth daily.   levothyroxine 75 MCG tablet Commonly known as: SYNTHROID Take 75 mcg by mouth daily.   Lutein-Zeaxanthin 25-5 MG Caps Take 1 capsule by mouth daily.   MOVE FREE PO Take 1 tablet by mouth every evening.   Myrbetriq 50 MG Tb24 tablet Generic drug: mirabegron ER Take 50 mg by mouth every evening.   oxybutynin 10 MG 24 hr tablet Commonly known as: DITROPAN-XL Take 10 mg by mouth daily.   phenylephrine-shark liver oil-mineral oil-petrolatum 0.25-3-14-71.9 % rectal ointment Commonly known as: PREPARATION H Place 1 application rectally 2 (two) times daily as needed for hemorrhoids.   predniSONE 5 MG tablet Commonly known as: DELTASONE Take 5 mg by mouth daily with breakfast.   sulfamethoxazole-trimethoprim 400-80 MG tablet Commonly known as: BACTRIM Take 1 tablet by mouth every Monday, Wednesday, and Friday.   Symbicort 160-4.5 MCG/ACT inhaler Generic drug:  budesonide-formoterol Inhale 2 puffs into the lungs in the morning and at bedtime.   Vitamin D 50 MCG (2000 UT) tablet Take 2,000 Units by mouth every evening.   Xarelto 10 MG Tabs tablet Generic drug: rivaroxaban Take 10 mg by mouth every evening.       No orders of the defined types were placed in this encounter.   Immunization History  Administered Date(s) Administered  . Influenza,inj,Quad PF,6+ Mos 07/28/2013  . Moderna SARS-COVID-2 Vaccination 11/28/2019  . PPD Test 02/14/2016    Social History   Tobacco Use  . Smoking status: Never Smoker  . Smokeless tobacco: Never Used  Substance Use Topics  . Alcohol use: No    Family history is   Family History  Problem Relation Age of Onset  . Alzheimer's disease Mother   . High Cholesterol Mother   . Alcoholism Father       Review of Systems   GENERAL:  no fevers,  fatigue, appetite changes SKIN: No itching, or rash EYES: No eye pain, redness, discharge EARS: No earache, tinnitus, change in hearing NOSE: No congestion, drainage or bleeding  MOUTH/THROAT: No mouth or tooth pain, No sore throat RESPIRATORY: No cough, wheezing, SOB CARDIAC: No chest pain, palpitations, lower extremity edema  GI: No abdominal pain, No N/V/D or constipation, No heartburn or reflux  GU: No dysuria, frequency or urgency, or incontinence  MUSCULOSKELETAL: No unrelieved bone/joint pain NEUROLOGIC: No headache, dizziness or focal weakness PSYCHIATRIC: No c/o anxiety or sadness   Vitals:   01/05/20 1124  BP: (!) 168/80  Pulse: 77  Resp: 16  Temp: 98.8 F (37.1 C)  SpO2: 98%    SpO2 Readings from Last 1 Encounters:  01/05/20 98%   Body mass index is 19.52 kg/m.     Physical Exam  GENERAL APPEARANCE: Alert, conversant,  No acute distress.  SKIN: No diaphoresis rash HEAD: Normocephalic, atraumatic  EYES: Conjunctiva/lids clear. Pupils round, reactive. EOMs intact.  EARS: External exam WNL, canals clear. Hearing grossly  normal.  NOSE: No deformity or discharge.  MOUTH/THROAT: Lips w/o lesions  RESPIRATORY: Breathing is even, unlabored. Lung sounds are clear   CARDIOVASCULAR: Heart RRR no murmurs, rubs or gallops. No peripheral edema.   GASTROINTESTINAL: Abdomen is soft, non-tender, not distended w/ normal bowel sounds. GENITOURINARY: Bladder non tender, not distended  MUSCULOSKELETAL: No abnormal joints or musculature NEUROLOGIC:  Cranial nerves 2-12 grossly intact. Moves all extremities  PSYCHIATRIC: Mood and affect appropriate to situation, no behavioral issues  Patient Active Problem List   Diagnosis Date Noted  . History of anemia due to CKD 12/31/2019  . Anemia in chronic kidney disease (CKD) 12/31/2019  . Generalized weakness 12/30/2019  . Cholecystitis with cholelithiasis 07/01/2018  . Postoperative anemia due to acute blood loss 02/15/2016  . Hyperlipidemia 02/15/2016  . Displaced fracture of right femoral neck ([place]) 02/10/2016  . Chronic diastolic CHF (congestive heart failure) ([place]) 02/08/2016  . Closed right hip fracture ([place]) 02/08/2016  . Hip fracture ([place]) 02/08/2016  . Acute diastolic CHF (congestive heart failure) ([place]) 05/27/2015  . Urinary incontinence 05/27/2015  . Escherichia coli urinary tract infection   . Other emphysema ([place])   . Hypokalemia   . Hypomagnesemia   . Sepsis secondary to UTI ([place]) 05/22/2015  . DVT, lower extremity, proximal ([place]) 03/02/2014  . Chronic kidney disease, stage 4, severely decreased GFR (H[facility]) 03/02/2014  . Essential hypertension 03/02/2014  . DVT (deep venous thrombosis) ([place]) 03/01/2014  . Acute renal failure superimposed on stage 3 chronic kidney disease ([place]) 03/01/2014  . Gastroenteritis 11/22/2013  . Nausea & vomiting 11/22/2013  . Hypercalcemia 11/22/2013  . Infection due to ESBL-producing Escherichia coli 10/07/2013  . Septicemia due to E. coli ([place]) 10/07/2013  . Urinary tract infection 10/05/2013  . Sepsis ([place]) 10/05/2013  .  History of renal transplant   . Hx of kidney transplant   . Hypertension   . Thyroid disease   . Multiple allergies   . Abnormal gait   . Reflux   . Diverticulitis   . Hypothyroidism   . Depression   . Anxiety   . COPD (chronic obstructive pulmonary disease) ([place])   . Dizziness   . Diabetes type 2, controlled ([place])       Labs reviewed: Basic Metabolic Panel:    Component Value Date/Time   NA 141 12/31/2019 0411   NA 142 07/18/2018 0000   K 3.9 12/31/2019 0411   CL 109 12/31/2019 0411  CO2 22 12/31/2019 0411   GLUCOSE 147 (H) 12/31/2019 0411   BUN 25 (H) 12/31/2019 0411   BUN 28 (A) 07/18/2018 0000   CREATININE 1.87 (H) 12/31/2019 0411   CALCIUM 9.4 12/31/2019 0411   PROT 6.9 12/30/2019 0758   ALBUMIN 3.2 (L) 12/30/2019 0758   AST 24 12/30/2019 0758   ALT 13 12/30/2019 0758   ALKPHOS 63 12/30/2019 0758   BILITOT 1.0 12/30/2019 0758   GFRNONAA 24 (L) 12/31/2019 0411   GFRAA 28 (L) 12/31/2019 0411    Recent Labs    12/29/19 2223 12/31/19 0411  NA 139 141  K 3.4* 3.9  CL 110 109  CO2 21* 22  GLUCOSE 149* 147*  BUN 22 25*  CREATININE 1.68* 1.87*  CALCIUM 9.6 9.4   Liver Function Tests: Recent Labs    12/30/19 0758  AST 24  ALT 13  ALKPHOS 63  BILITOT 1.0  PROT 6.9  ALBUMIN 3.2*   No results for input(s): LIPASE, AMYLASE in the last 8760 hours. No results for input(s): AMMONIA in the last 8760 hours. CBC: Recent Labs    12/29/19 2223 12/31/19 0411  WBC 9.5 7.0  HGB 12.1 10.0*  HCT 36.8 30.2*  MCV 98.7 100.3*  PLT 217 159   Lipid No results for input(s): CHOL, HDL, LDLCALC, TRIG in the last 8760 hours.  Cardiac Enzymes: Recent Labs    12/29/19 2223  CKTOTAL 49   BNP: No results for input(s): BNP in the last 8760 hours. No results found for: [facility] Lab Results  Component Value Date   HGBA1C 5.1 07/01/2018   Lab Results  Component Value Date   TSH 2.384 12/30/2019   No results found for: VITAMINB12 No results found for:  FOLATE No results found for: IRON, TIBC, FERRITIN  Imaging and Procedures obtained prior to SNF admission: CT Head Wo Contrast  Result Date: 12/30/2019 CLINICAL DATA:  86 year old female with history of vertigo. EXAM: CT HEAD WITHOUT CONTRAST TECHNIQUE: Contiguous axial images were obtained from the base of the skull through the vertex without intravenous contrast. COMPARISON:  Head CT 05/18/2017. FINDINGS: Brain: Mild cerebral atrophy. Patchy and confluent areas of decreased attenuation are noted throughout the deep and periventricular white matter of the cerebral hemispheres bilaterally, compatible with chronic microvascular ischemic disease. Postoperative changes of left occipital craniectomy with large region of low attenuation in the left cerebellar hemisphere, similar to the prior examination, most compatible with an area of encephalomalacia/gliosis from old left cerebellar infarct. In the left occipital region there is a high attenuation structure intimately associated with the tentorium cerebelli which measures 2.7 x 1.7 x 1.3 cm (axial image 13 of series 3 and coronal image 52 of series 5), similar to the prior study, most compatible with a meningioma. No evidence of acute infarction, hemorrhage, hydrocephalus or extra-axial collection. Vascular: No hyperdense vessel or unexpected calcification. Skull: Status post right occipital craniectomy. Negative for fracture or focal lesion. Bilateral mastoid effusions, unchanged. Sinuses/Orbits: No acute finding. Other: None. IMPRESSION: 1. No acute intracranial abnormalities. 2. Postoperative changes of left occipital craniectomy with chronic encephalomalacia/gliosis in the left cerebellar hemisphere, similar to prior study. 3. Hyperdense lesion associated with the left tentorium cerebelli, similar to prior examinations, compatible with a calcified meningioma. 4. Mild cerebral atrophy with extensive chronic microvascular ischemic changes in the cerebral white  matter, as above. 5. Bilateral mastoid effusions which appear to be chronic. Electronically Signed   By: Vinnie Langton M.D.   On: 12/30/2019 08:22  DG Chest Portable 1 View  Result Date: 12/30/2019 CLINICAL DATA:  Fever, short of breath EXAM: PORTABLE CHEST 1 VIEW COMPARISON:  07/05/2019 FINDINGS: Normal cardiac silhouette. Hyperinflated lungs. Several calcified nodules in the LEFT lung again noted. Bilateral healed rib fractures. Degenerative change of LEFT shoulder with remote fracture. IMPRESSION: Hyperinflated lungs.  No acute findings Electronically Signed   By: Suzy Bouchard M.D.   On: 12/30/2019     Not all labs, radiology exams or other studies done during hospitalization come through on my EPIC note; however they are reviewed by me.    Assessment and Plan  Generalized weakness-patient is wheelchair-bound at baseline; only change recently is reluctant to get out of bed and sit up in the wheelchair for meals SNF admitted for OT/PT  Chronic kidney disease stage IV/status post renal transplant- SNF-creatinine 1.87 which is stable from prior;continue azathioprine 50 mg twice daily, cyclosporine 100 mg twice daily, prednisone 5 mg daily and Bactrim 3 times a week as prophylaxis  Hypertension SNF-not well controlled; continue clonidine 0.1 mg twice daily and monitor  Hypothyroidism SNF-not stated as uncontrolled; continue Synthroid 75 mcg daily  Past medical history DVT-chronic DVT on ultrasound 2014, chronic DVT on ultrasound 2018 SNF-continue Xarelto 10 mg daily  Depression; continue Prozac 20 mg daily  Anemia SNF-hemoglobin status stable; most recent hemoglobin is 10; continue iron 325 mg daily   Time spent greater than 45 minutes;> 50% of time with patient was spent reviewing records, labs, tests and studies, counseling and developing plan of care  Hennie Duos, MD

## 2020-01-08 ENCOUNTER — Encounter: Payer: Self-pay | Admitting: Internal Medicine

## 2020-01-20 ENCOUNTER — Non-Acute Institutional Stay (SKILLED_NURSING_FACILITY): Payer: Medicare PPO | Admitting: Internal Medicine

## 2020-01-20 DIAGNOSIS — N189 Chronic kidney disease, unspecified: Secondary | ICD-10-CM | POA: Diagnosis not present

## 2020-01-20 DIAGNOSIS — I825Y2 Chronic embolism and thrombosis of unspecified deep veins of left proximal lower extremity: Secondary | ICD-10-CM | POA: Diagnosis not present

## 2020-01-20 DIAGNOSIS — Z862 Personal history of diseases of the blood and blood-forming organs and certain disorders involving the immune mechanism: Secondary | ICD-10-CM | POA: Diagnosis not present

## 2020-01-20 DIAGNOSIS — N184 Chronic kidney disease, stage 4 (severe): Secondary | ICD-10-CM | POA: Diagnosis not present

## 2020-01-20 DIAGNOSIS — I10 Essential (primary) hypertension: Secondary | ICD-10-CM

## 2020-01-20 DIAGNOSIS — Z940 Kidney transplant status: Secondary | ICD-10-CM

## 2020-01-21 ENCOUNTER — Encounter: Payer: Self-pay | Admitting: Internal Medicine

## 2020-01-21 MED ORDER — ALBUTEROL SULFATE HFA 108 (90 BASE) MCG/ACT IN AERS: 2.0000 | INHALATION_SPRAY | Freq: Four times a day (QID) | RESPIRATORY_TRACT | 0 refills | Status: DC | PRN

## 2020-01-21 MED ORDER — LEVOTHYROXINE SODIUM 75 MCG PO TABS: 75.0000 ug | ORAL_TABLET | Freq: Every day | ORAL | 0 refills | Status: DC

## 2020-01-21 MED ORDER — CETIRIZINE HCL 10 MG PO TABS: 10.0000 mg | ORAL_TABLET | Freq: Every day | ORAL | 0 refills | Status: DC

## 2020-01-21 MED ORDER — BUDESONIDE-FORMOTEROL FUMARATE 160-4.5 MCG/ACT IN AERO: 2.0000 | INHALATION_SPRAY | Freq: Two times a day (BID) | RESPIRATORY_TRACT | 0 refills | Status: DC

## 2020-01-21 MED ORDER — BENADRYL ITCH STOPPING 2 % EX GEL: CUTANEOUS | 0 refills | Status: DC

## 2020-01-21 MED ORDER — CYCLOSPORINE MODIFIED (NEORAL) 100 MG PO CAPS: 100.0000 mg | ORAL_CAPSULE | Freq: Two times a day (BID) | ORAL | 0 refills | Status: DC

## 2020-01-21 MED ORDER — CLONIDINE HCL 0.1 MG PO TABS: 0.1000 mg | ORAL_TABLET | Freq: Two times a day (BID) | ORAL | 0 refills | Status: DC

## 2020-01-21 MED ORDER — XARELTO 10 MG PO TABS: 10.0000 mg | ORAL_TABLET | Freq: Every evening | ORAL | 0 refills | Status: DC

## 2020-01-21 MED ORDER — AZATHIOPRINE 50 MG PO TABS: 50.0000 mg | ORAL_TABLET | Freq: Two times a day (BID) | ORAL | 0 refills | Status: DC

## 2020-01-21 MED ORDER — OXYBUTYNIN CHLORIDE ER 10 MG PO TB24: 10.0000 mg | ORAL_TABLET | Freq: Every day | ORAL | 0 refills | Status: DC

## 2020-01-21 MED ORDER — SULFAMETHOXAZOLE-TRIMETHOPRIM 400-80 MG PO TABS: 1.0000 | ORAL_TABLET | ORAL | 0 refills | Status: DC

## 2020-01-21 MED ORDER — FLUOXETINE HCL 20 MG PO TABS: 20.0000 mg | ORAL_TABLET | Freq: Every day | ORAL | 0 refills | Status: DC

## 2020-01-21 MED ORDER — MIRABEGRON ER 50 MG PO TB24: 50.0000 mg | ORAL_TABLET | Freq: Every evening | ORAL | 0 refills | Status: DC

## 2020-01-21 MED ORDER — PREDNISONE 5 MG PO TABS: 5.0000 mg | ORAL_TABLET | Freq: Every day | ORAL | 0 refills | Status: DC

## 2020-01-21 NOTE — Progress Notes (Signed)
Location:    [facility] Room Number: 104/P Place of Service:  SNF [phone])  Provider: Lorne Skeens  PCP: Hennie Duos, MD Patient Care Team: Hennie Duos, MD as PCP - General (Internal Medicine)  Extended Emergency Contact Information Primary Emergency Contact: Temme,Howard Address: [address]          [place] of [place] Phone: [phone] Mobile Phone: [phone] Relation: Spouse Secondary Emergency Contact: Franki Cabot, [place] of [place] Mobile Phone: [phone] Relation: Son  Code Status: DNR Goals of care:  Advanced Directive information Advanced Directives 01/21/2020  Does Patient Have a Medical Advance Directive? Yes  Type of Advance Directive Out of facility DNR (pink MOST or yellow form)  Does patient want to make changes to medical advance directive? No - Patient declined  Copy of [place] in Chart? -  Would patient like information on creating a medical advance directive? -  Pre-existing out of facility DNR order (yellow form or pink MOST form) Yellow form placed in chart (order not valid for inpatient use)     Allergies  Allergen Reactions  . Macrolides And Ketolides Nausea Only  . Aricept [Donepezil Hcl] Nausea And Vomiting  . Codeine Nausea And Vomiting  . Morphine Nausea And Vomiting  . Penicillin G Nausea Only  . Penicillins Nausea And Vomiting and Other (See Comments)    HEADACHE Has patient had a PCN reaction causing immediate rash, facial/tongue/throat swelling, SOB or lightheadedness with hypotension: unkn Has patient had a PCN reaction causing severe rash involving mucus membranes or skin necrosis: unkn Has patient had a PCN reaction that required hospitalization: unkn Has patient had a PCN reaction occurring within the last 10 years: unkn If all of the above answers are "NO", then may proceed with Cephalosporin use.   .  Statins Other (See Comments)    Doesn't remember  . Streptomycin Other (See Comments)    headache   . Tetracycline Nausea Only  . Tetracyclines & Related Rash    Chief Complaint  Patient presents with  . Discharge Note    Discharge Visit    HPI:  86 y.o. female seen today for discharge slated for tomorrow April 8.  Patient has been here for rehab after being hospitalized with weakness.  She has a history of a renal transplant in 2009 and presented with lethargy fatigue and increased weakness and poor p.o. intake.  Her stay here has been fairly unremarkable she has gained strength and is looking forward to going home with her husband she will need continued PT and OT.  She has no complaints today vital signs appear to be stable-    Past Medical History:  Diagnosis Date  . Abnormal gait   . Anxiety   . Arthritis   . Asthma   . COPD, mild ([place])   . Coronary artery disease    pt denies  . Depression   . Diverticulitis   . Dizziness   . DVT (deep venous thrombosis) ([place])   . GERD (gastroesophageal reflux disease)   . Hx of kidney transplant    right  . Hypertension   . Hypothyroidism   . Multiple allergies   . Pneumonia   . Pre-diabetes   . Reflux   . Renal disorder    had kidney transplant in 2009    Past Surgical History:  Procedure Laterality Date  .  ANTERIOR APPROACH HEMI HIP ARTHROPLASTY Right 02/10/2016   Procedure: TOTAL HIP ARTHROPLASTY ANTERIOR APPROACH;  Surgeon: Rod Can, MD;  Location: [facility];  Service: Orthopedics;  Laterality: Right;  . CHOLECYSTECTOMY N/A 07/01/2018   Procedure: LAPAROSCOPIC CHOLECYSTECTOMY WITH INTRAOPERATIVE CHOLANGIOGRAM;  Surgeon: Jovita Kussmaul, MD;  Location: [facility];  Service: General;  Laterality: N/A;  . COLONOSCOPY    . NEPHRECTOMY TRANSPLANTED ORGAN    . TUMOR REMOVAL     Brain       reports that she has never smoked. She has never used smokeless tobacco. She reports that she does not drink alcohol or use  drugs. Social History   Socioeconomic History  . Marital status: Married    Spouse name: [name]  . Number of children: 2  . Years of education: college  . Highest education level: Not on file  Occupational History  . Occupation: retired  Tobacco Use  . Smoking status: Never Smoker  . Smokeless tobacco: Never Used  Substance and Sexual Activity  . Alcohol use: No  . Drug use: No  . Sexual activity: Not on file  Other Topics Concern  . Not on file  Social History Narrative   Patient lives at home with her husband [name]). Patient is retired. Patient has two children. Patient has college education.   Right handed.   Caffeine- None- Tea during day one cup   Social Determinants of Health   Financial Resource Strain:   . Difficulty of Paying Living Expenses:   Food Insecurity:   . Worried About Charity fundraiser in the Last Year:   . Arboriculturist in the Last Year:   Transportation Needs:   . Film/video editor (Medical):   Marland Kitchen Lack of Transportation (Non-Medical):   Physical Activity:   . Days of Exercise per Week:   . Minutes of Exercise per Session:   Stress:   . Feeling of Stress :   Social Connections:   . Frequency of Communication with Friends and Family:   . Frequency of Social Gatherings with Friends and Family:   . Attends Religious Services:   . Active Member of Clubs or Organizations:   . Attends Archivist Meetings:   Marland Kitchen Marital Status:   Intimate Partner Violence:   . Fear of Current or Ex-Partner:   . Emotionally Abused:   Marland Kitchen Physically Abused:   . Sexually Abused:    Functional Status Survey:    Allergies  Allergen Reactions  . Macrolides And Ketolides Nausea Only  . Aricept [Donepezil Hcl] Nausea And Vomiting  . Codeine Nausea And Vomiting  . Morphine Nausea And Vomiting  . Penicillin G Nausea Only  . Penicillins Nausea And Vomiting and Other (See Comments)    HEADACHE Has patient had a PCN reaction causing immediate rash,  facial/tongue/throat swelling, SOB or lightheadedness with hypotension: unkn Has patient had a PCN reaction causing severe rash involving mucus membranes or skin necrosis: unkn Has patient had a PCN reaction that required hospitalization: unkn Has patient had a PCN reaction occurring within the last 10 years: unkn If all of the above answers are "NO", then may proceed with Cephalosporin use.   . Statins Other (See Comments)    Doesn't remember  . Streptomycin Other (See Comments)    headache   . Tetracycline Nausea Only  . Tetracyclines & Related Rash    Pertinent  Health Maintenance Due  Topic Date Due  . FOOT EXAM  Never done  .  OPHTHALMOLOGY EXAM  Never done  . PNA vac Low Risk Adult (1 of 2 - PCV13) Never done  . HEMOGLOBIN A1C  12/30/2018  . INFLUENZA VACCINE  05/15/2020  . DEXA SCAN  Completed    Medications: Allergies as of 01/20/2020      Reactions   Macrolides And Ketolides Nausea Only   Aricept [donepezil Hcl] Nausea And Vomiting   Codeine Nausea And Vomiting   Morphine Nausea And Vomiting   Penicillin G Nausea Only   Penicillins Nausea And Vomiting, Other (See Comments)   HEADACHE Has patient had a PCN reaction causing immediate rash, facial/tongue/throat swelling, SOB or lightheadedness with hypotension: unkn Has patient had a PCN reaction causing severe rash involving mucus membranes or skin necrosis: unkn Has patient had a PCN reaction that required hospitalization: unkn Has patient had a PCN reaction occurring within the last 10 years: unkn If all of the above answers are "NO", then may proceed with Cephalosporin use.   Statins Other (See Comments)   Doesn't remember   Streptomycin Other (See Comments)   headache   Tetracycline Nausea Only   Tetracyclines & Related Rash      Medication List       Accurate as of January 20, 2020 11:59 PM. If you have any questions, ask your nurse or doctor.        acetaminophen 325 MG tablet Commonly known as:  TYLENOL Take 650 mg by mouth every 6 (six) hours as needed.   albuterol 108 (90 Base) MCG/ACT inhaler Commonly known as: VENTOLIN HFA Inhale 2 puffs into the lungs every 6 (six) hours as needed for wheezing or shortness of breath.   azaTHIOprine 50 MG tablet Commonly known as: IMURAN Take 50 mg by mouth 2 (two) times daily.   b complex vitamins tablet Take 1 tablet by mouth daily.   Benadryl Itch Stopping 2 % Gel Generic drug: DIPHENHYDRAMINE HCL (TOPICAL) Apply 1 application topically daily as needed (itch relief).   cetirizine 10 MG tablet Commonly known as: ZYRTEC Take 10 mg by mouth daily.   cloNIDine 0.1 MG tablet Commonly known as: CATAPRES Take 0.1 mg by mouth 2 (two) times daily.   cycloSPORINE modified 100 MG capsule Commonly known as: NEORAL Take 100 mg by mouth 2 (two) times daily.   famotidine 20 MG tablet Commonly known as: PEPCID Take 20 mg by mouth daily with supper.   ferrous sulfate 325 (65 FE) MG tablet Take 325 mg by mouth daily with breakfast.   FLUoxetine 20 MG tablet Commonly known as: PROZAC Take 20 mg by mouth daily.   fluticasone 50 MCG/ACT nasal spray Commonly known as: FLONASE Place 1 spray into both nostrils daily as needed for allergies or rhinitis.   Glucerna Liqd Take 237 mLs by mouth 2 (two) times daily.   L-Lysine 500 MG Tabs Take 500 mg by mouth daily.   levothyroxine 75 MCG tablet Commonly known as: SYNTHROID Take 75 mcg by mouth daily.   Lutein-Zeaxanthin 25-5 MG Caps Take 1 capsule by mouth daily.   MOVE FREE PO Take 1 tablet by mouth every evening.   Myrbetriq 50 MG Tb24 tablet Generic drug: mirabegron ER Take 50 mg by mouth every evening.   oxybutynin 10 MG 24 hr tablet Commonly known as: DITROPAN-XL Take 10 mg by mouth daily.   phenylephrine-shark liver oil-mineral oil-petrolatum 0.25-3-14-71.9 % rectal ointment Commonly known as: PREPARATION H Place 1 application rectally 2 (two) times daily as needed for  hemorrhoids.   predniSONE  5 MG tablet Commonly known as: DELTASONE Take 5 mg by mouth daily with breakfast.   sulfamethoxazole-trimethoprim 400-80 MG tablet Commonly known as: BACTRIM Take 1 tablet by mouth every Monday, Wednesday, and Friday.   Symbicort 160-4.5 MCG/ACT inhaler Generic drug: budesonide-formoterol Inhale 2 puffs into the lungs in the morning and at bedtime.   Vitamin D 50 MCG (2000 UT) tablet Take 2,000 Units by mouth every evening.   Xarelto 10 MG Tabs tablet Generic drug: rivaroxaban Take 10 mg by mouth every evening.       Review of Systems   In general she not complaining of any fever or chills.  Skin no complaints of rashes or itching.  Head ears eyes nose mouth and throat is not complain of visual changes or sore throat.  Respiratory does not complain of being short of breath or having a cough.  Cardiac no complaints of chest pain or increased edema from baseline.  GI is not complaining of abdominal pain nausea vomiting diarrhea constipation.  GU is not complaining of dysuria.  Musculoskeletal has some continued weakness but says she is gaining strength does not complain of pain currently.  Neurologic as noted above she is not complain of syncope dizziness or headache.  And psych is not complaining of having any depression or overt anxiety appears to be in good spirits    Vitals:   01/21/20 0744  BP: 140/84  Pulse: 90  Resp: 17  Temp: 97.9 F (36.6 C)  TempSrc: Oral  SpO2: 98%  Weight: 116 lb 1.6 oz (52.7 kg)  Height: 5\' 5"  (1.651 m)   Body mass index is 19.32 kg/m. Physical Exam In general this is a pleasant elderly female in no distress.  Her skin is warm and dry.  Eyes visual acuity appears to be intact sclera and conjunctive are clear she has prescription lenses.  Oropharynx clear mucous membranes moist.  Chest is clear to auscultation there is no labored breathing.  Heart is regular rate and rhythm without murmur  gallop or rub she has mild baseline lower extremity edema.  Abdomen is soft nontender with positive bowel sounds.  Musculoskeletal is able to move all extremities x4 at baseline has gained strength she is now using a walker.  Neurologic appears grossly intact cannot appreciate lateralizing findings her speech is clear.  Psych she is alert and oriented very pleasant and appropriate.   Labs reviewed  January 05, 2020.  WBC 5.1 hemoglobin 9.7 platelets 239.  Sodium 143 potassium 4.5 BUN 24.6 creatinine 1.82.  : Basic Metabolic Panel: Recent Labs    12/29/19 2223 12/31/19 0411  NA 139 141  K 3.4* 3.9  CL 110 109  CO2 21* 22  GLUCOSE 149* 147*  BUN 22 25*  CREATININE 1.68* 1.87*  CALCIUM 9.6 9.4   Liver Function Tests: Recent Labs    12/30/19 0758  AST 24  ALT 13  ALKPHOS 63  BILITOT 1.0  PROT 6.9  ALBUMIN 3.2*   No results for input(s): LIPASE, AMYLASE in the last 8760 hours. No results for input(s): AMMONIA in the last 8760 hours. CBC: Recent Labs    12/29/19 2223 12/31/19 0411  WBC 9.5 7.0  HGB 12.1 10.0*  HCT 36.8 30.2*  MCV 98.7 100.3*  PLT 217 159   Cardiac Enzymes: Recent Labs    12/29/19 2223  CKTOTAL 49   BNP: Invalid input(s): POCBNP CBG: No results for input(s): GLUCAP in the last 8760 hours.  Procedures and Imaging Studies During Stay:  CT Head Wo Contrast  Result Date: 12/30/2019 CLINICAL DATA:  86 year old female with history of vertigo. EXAM: CT HEAD WITHOUT CONTRAST TECHNIQUE: Contiguous axial images were obtained from the base of the skull through the vertex without intravenous contrast. COMPARISON:  Head CT 05/18/2017. FINDINGS: Brain: Mild cerebral atrophy. Patchy and confluent areas of decreased attenuation are noted throughout the deep and periventricular white matter of the cerebral hemispheres bilaterally, compatible with chronic microvascular ischemic disease. Postoperative changes of left occipital craniectomy with large region  of low attenuation in the left cerebellar hemisphere, similar to the prior examination, most compatible with an area of encephalomalacia/gliosis from old left cerebellar infarct. In the left occipital region there is a high attenuation structure intimately associated with the tentorium cerebelli which measures 2.7 x 1.7 x 1.3 cm (axial image 13 of series 3 and coronal image 52 of series 5), similar to the prior study, most compatible with a meningioma. No evidence of acute infarction, hemorrhage, hydrocephalus or extra-axial collection. Vascular: No hyperdense vessel or unexpected calcification. Skull: Status post right occipital craniectomy. Negative for fracture or focal lesion. Bilateral mastoid effusions, unchanged. Sinuses/Orbits: No acute finding. Other: None. IMPRESSION: 1. No acute intracranial abnormalities. 2. Postoperative changes of left occipital craniectomy with chronic encephalomalacia/gliosis in the left cerebellar hemisphere, similar to prior study. 3. Hyperdense lesion associated with the left tentorium cerebelli, similar to prior examinations, compatible with a calcified meningioma. 4. Mild cerebral atrophy with extensive chronic microvascular ischemic changes in the cerebral white matter, as above. 5. Bilateral mastoid effusions which appear to be chronic. Electronically Signed   By: Vinnie Langton M.D.   On: 12/30/2019 08:22   DG Chest Portable 1 View  Result Date: 12/30/2019 CLINICAL DATA:  Fever, short of breath EXAM: PORTABLE CHEST 1 VIEW COMPARISON:  07/05/2019 FINDINGS: Normal cardiac silhouette. Hyperinflated lungs. Several calcified nodules in the LEFT lung again noted. Bilateral healed rib fractures. Degenerative change of LEFT shoulder with remote fracture. IMPRESSION: Hyperinflated lungs.  No acute findings Electronically Signed   By: Suzy Bouchard M.D.   On: 12/30/2019 08:14    Assessment/Plan:    #1 generalized weakness-she appears to have gained strength working with  PT and OT she will need continued PT and OT at home.  She appears to have done quite well.  2.  History of chronic kidney disease stage IV status post renal transplant-creatinine of 1.82 appears stable-she is on azithromycin 50 mg twice daily cyclosporine 100 mg twice daily prednisone 5 mg a day and Bactrim 3 times a day as prophylaxis.  3.  History of hypertension blood pressure appears relatively stable recent systolics in the 5/36/6440 range-she is on clonidine 0.1 mg twice daily since she is about to go home would be hesitant to make changes will defer to primary care provider.  4.  History of hypothyroidism not stated as uncontrolled she continues on Synthroid 75 mcg a day-will defer follow-up to primary care provider.  5.  Has a history of prior DVT she is on Xarelto 10 mg a day this appears stable.  6.  History of depression this appears quite stable on Prozac 20 mg a day.  7.  Anemia she is on iron last hemoglobin was 9.7 this will warrant follow-up by primary care provider.   She will need continued PT OT at home she will be going home with her husband at this point appears to have gained strength she will need expedient primary care follow-up.  [id number] note greater than 30 minutes  spent on this discharge summary-greater than 50% of time spent formulating a plan of care for numerous diagnoses

## 2020-01-22 ENCOUNTER — Encounter: Payer: Self-pay | Admitting: Internal Medicine

## 2020-01-27 DIAGNOSIS — I130 Hypertensive heart and chronic kidney disease with heart failure and stage 1 through stage 4 chronic kidney disease, or unspecified chronic kidney disease: Secondary | ICD-10-CM | POA: Diagnosis not present

## 2020-01-27 DIAGNOSIS — I2510 Atherosclerotic heart disease of native coronary artery without angina pectoris: Secondary | ICD-10-CM | POA: Diagnosis not present

## 2020-01-27 DIAGNOSIS — M1991 Primary osteoarthritis, unspecified site: Secondary | ICD-10-CM | POA: Diagnosis not present

## 2020-01-27 DIAGNOSIS — J45909 Unspecified asthma, uncomplicated: Secondary | ICD-10-CM | POA: Diagnosis not present

## 2020-01-27 DIAGNOSIS — J438 Other emphysema: Secondary | ICD-10-CM | POA: Diagnosis not present

## 2020-01-27 DIAGNOSIS — D631 Anemia in chronic kidney disease: Secondary | ICD-10-CM | POA: Diagnosis not present

## 2020-01-27 DIAGNOSIS — N184 Chronic kidney disease, stage 4 (severe): Secondary | ICD-10-CM | POA: Diagnosis not present

## 2020-01-27 DIAGNOSIS — I5033 Acute on chronic diastolic (congestive) heart failure: Secondary | ICD-10-CM | POA: Diagnosis not present

## 2020-01-27 DIAGNOSIS — E039 Hypothyroidism, unspecified: Secondary | ICD-10-CM | POA: Diagnosis not present

## 2020-02-01 DIAGNOSIS — I2510 Atherosclerotic heart disease of native coronary artery without angina pectoris: Secondary | ICD-10-CM | POA: Diagnosis not present

## 2020-02-01 DIAGNOSIS — M1991 Primary osteoarthritis, unspecified site: Secondary | ICD-10-CM | POA: Diagnosis not present

## 2020-02-01 DIAGNOSIS — Z940 Kidney transplant status: Secondary | ICD-10-CM | POA: Diagnosis not present

## 2020-02-01 DIAGNOSIS — I130 Hypertensive heart and chronic kidney disease with heart failure and stage 1 through stage 4 chronic kidney disease, or unspecified chronic kidney disease: Secondary | ICD-10-CM | POA: Diagnosis not present

## 2020-02-01 DIAGNOSIS — J45909 Unspecified asthma, uncomplicated: Secondary | ICD-10-CM | POA: Diagnosis not present

## 2020-02-01 DIAGNOSIS — I129 Hypertensive chronic kidney disease with stage 1 through stage 4 chronic kidney disease, or unspecified chronic kidney disease: Secondary | ICD-10-CM | POA: Diagnosis not present

## 2020-02-01 DIAGNOSIS — N184 Chronic kidney disease, stage 4 (severe): Secondary | ICD-10-CM | POA: Diagnosis not present

## 2020-02-01 DIAGNOSIS — I5033 Acute on chronic diastolic (congestive) heart failure: Secondary | ICD-10-CM | POA: Diagnosis not present

## 2020-02-01 DIAGNOSIS — J438 Other emphysema: Secondary | ICD-10-CM | POA: Diagnosis not present

## 2020-02-01 DIAGNOSIS — D692 Other nonthrombocytopenic purpura: Secondary | ICD-10-CM | POA: Diagnosis not present

## 2020-02-01 DIAGNOSIS — J449 Chronic obstructive pulmonary disease, unspecified: Secondary | ICD-10-CM | POA: Diagnosis not present

## 2020-02-01 DIAGNOSIS — D631 Anemia in chronic kidney disease: Secondary | ICD-10-CM | POA: Diagnosis not present

## 2020-02-01 DIAGNOSIS — E039 Hypothyroidism, unspecified: Secondary | ICD-10-CM | POA: Diagnosis not present

## 2020-02-02 DIAGNOSIS — D631 Anemia in chronic kidney disease: Secondary | ICD-10-CM | POA: Diagnosis not present

## 2020-02-02 DIAGNOSIS — N189 Chronic kidney disease, unspecified: Secondary | ICD-10-CM | POA: Diagnosis not present

## 2020-02-02 DIAGNOSIS — F325 Major depressive disorder, single episode, in full remission: Secondary | ICD-10-CM | POA: Diagnosis not present

## 2020-02-02 DIAGNOSIS — J449 Chronic obstructive pulmonary disease, unspecified: Secondary | ICD-10-CM | POA: Diagnosis not present

## 2020-02-02 DIAGNOSIS — M810 Age-related osteoporosis without current pathological fracture: Secondary | ICD-10-CM | POA: Diagnosis not present

## 2020-02-02 DIAGNOSIS — I129 Hypertensive chronic kidney disease with stage 1 through stage 4 chronic kidney disease, or unspecified chronic kidney disease: Secondary | ICD-10-CM | POA: Diagnosis not present

## 2020-02-02 DIAGNOSIS — H35033 Hypertensive retinopathy, bilateral: Secondary | ICD-10-CM | POA: Diagnosis not present

## 2020-02-02 DIAGNOSIS — Z940 Kidney transplant status: Secondary | ICD-10-CM | POA: Diagnosis not present

## 2020-02-02 DIAGNOSIS — I10 Essential (primary) hypertension: Secondary | ICD-10-CM | POA: Diagnosis not present

## 2020-02-02 DIAGNOSIS — N184 Chronic kidney disease, stage 4 (severe): Secondary | ICD-10-CM | POA: Diagnosis not present

## 2020-02-02 DIAGNOSIS — E039 Hypothyroidism, unspecified: Secondary | ICD-10-CM | POA: Diagnosis not present

## 2020-02-02 DIAGNOSIS — E7800 Pure hypercholesterolemia, unspecified: Secondary | ICD-10-CM | POA: Diagnosis not present

## 2020-02-03 DIAGNOSIS — J438 Other emphysema: Secondary | ICD-10-CM | POA: Diagnosis not present

## 2020-02-03 DIAGNOSIS — M1991 Primary osteoarthritis, unspecified site: Secondary | ICD-10-CM | POA: Diagnosis not present

## 2020-02-03 DIAGNOSIS — E039 Hypothyroidism, unspecified: Secondary | ICD-10-CM | POA: Diagnosis not present

## 2020-02-03 DIAGNOSIS — I130 Hypertensive heart and chronic kidney disease with heart failure and stage 1 through stage 4 chronic kidney disease, or unspecified chronic kidney disease: Secondary | ICD-10-CM | POA: Diagnosis not present

## 2020-02-03 DIAGNOSIS — N184 Chronic kidney disease, stage 4 (severe): Secondary | ICD-10-CM | POA: Diagnosis not present

## 2020-02-03 DIAGNOSIS — D631 Anemia in chronic kidney disease: Secondary | ICD-10-CM | POA: Diagnosis not present

## 2020-02-03 DIAGNOSIS — I2510 Atherosclerotic heart disease of native coronary artery without angina pectoris: Secondary | ICD-10-CM | POA: Diagnosis not present

## 2020-02-03 DIAGNOSIS — J45909 Unspecified asthma, uncomplicated: Secondary | ICD-10-CM | POA: Diagnosis not present

## 2020-02-03 DIAGNOSIS — I5033 Acute on chronic diastolic (congestive) heart failure: Secondary | ICD-10-CM | POA: Diagnosis not present

## 2020-02-04 DIAGNOSIS — I130 Hypertensive heart and chronic kidney disease with heart failure and stage 1 through stage 4 chronic kidney disease, or unspecified chronic kidney disease: Secondary | ICD-10-CM | POA: Diagnosis not present

## 2020-02-04 DIAGNOSIS — J45909 Unspecified asthma, uncomplicated: Secondary | ICD-10-CM | POA: Diagnosis not present

## 2020-02-04 DIAGNOSIS — E039 Hypothyroidism, unspecified: Secondary | ICD-10-CM | POA: Diagnosis not present

## 2020-02-04 DIAGNOSIS — M1991 Primary osteoarthritis, unspecified site: Secondary | ICD-10-CM | POA: Diagnosis not present

## 2020-02-04 DIAGNOSIS — I2510 Atherosclerotic heart disease of native coronary artery without angina pectoris: Secondary | ICD-10-CM | POA: Diagnosis not present

## 2020-02-04 DIAGNOSIS — J438 Other emphysema: Secondary | ICD-10-CM | POA: Diagnosis not present

## 2020-02-04 DIAGNOSIS — D631 Anemia in chronic kidney disease: Secondary | ICD-10-CM | POA: Diagnosis not present

## 2020-02-04 DIAGNOSIS — N184 Chronic kidney disease, stage 4 (severe): Secondary | ICD-10-CM | POA: Diagnosis not present

## 2020-02-04 DIAGNOSIS — I5033 Acute on chronic diastolic (congestive) heart failure: Secondary | ICD-10-CM | POA: Diagnosis not present

## 2020-02-09 DIAGNOSIS — I2510 Atherosclerotic heart disease of native coronary artery without angina pectoris: Secondary | ICD-10-CM | POA: Diagnosis not present

## 2020-02-09 DIAGNOSIS — J45909 Unspecified asthma, uncomplicated: Secondary | ICD-10-CM | POA: Diagnosis not present

## 2020-02-09 DIAGNOSIS — M1991 Primary osteoarthritis, unspecified site: Secondary | ICD-10-CM | POA: Diagnosis not present

## 2020-02-09 DIAGNOSIS — D631 Anemia in chronic kidney disease: Secondary | ICD-10-CM | POA: Diagnosis not present

## 2020-02-09 DIAGNOSIS — I130 Hypertensive heart and chronic kidney disease with heart failure and stage 1 through stage 4 chronic kidney disease, or unspecified chronic kidney disease: Secondary | ICD-10-CM | POA: Diagnosis not present

## 2020-02-09 DIAGNOSIS — E039 Hypothyroidism, unspecified: Secondary | ICD-10-CM | POA: Diagnosis not present

## 2020-02-09 DIAGNOSIS — J438 Other emphysema: Secondary | ICD-10-CM | POA: Diagnosis not present

## 2020-02-09 DIAGNOSIS — N184 Chronic kidney disease, stage 4 (severe): Secondary | ICD-10-CM | POA: Diagnosis not present

## 2020-02-09 DIAGNOSIS — I5033 Acute on chronic diastolic (congestive) heart failure: Secondary | ICD-10-CM | POA: Diagnosis not present

## 2020-02-10 DIAGNOSIS — J438 Other emphysema: Secondary | ICD-10-CM | POA: Diagnosis not present

## 2020-02-10 DIAGNOSIS — E039 Hypothyroidism, unspecified: Secondary | ICD-10-CM | POA: Diagnosis not present

## 2020-02-10 DIAGNOSIS — I2510 Atherosclerotic heart disease of native coronary artery without angina pectoris: Secondary | ICD-10-CM | POA: Diagnosis not present

## 2020-02-10 DIAGNOSIS — M1991 Primary osteoarthritis, unspecified site: Secondary | ICD-10-CM | POA: Diagnosis not present

## 2020-02-10 DIAGNOSIS — D631 Anemia in chronic kidney disease: Secondary | ICD-10-CM | POA: Diagnosis not present

## 2020-02-10 DIAGNOSIS — J45909 Unspecified asthma, uncomplicated: Secondary | ICD-10-CM | POA: Diagnosis not present

## 2020-02-10 DIAGNOSIS — I5033 Acute on chronic diastolic (congestive) heart failure: Secondary | ICD-10-CM | POA: Diagnosis not present

## 2020-02-10 DIAGNOSIS — I130 Hypertensive heart and chronic kidney disease with heart failure and stage 1 through stage 4 chronic kidney disease, or unspecified chronic kidney disease: Secondary | ICD-10-CM | POA: Diagnosis not present

## 2020-02-10 DIAGNOSIS — N184 Chronic kidney disease, stage 4 (severe): Secondary | ICD-10-CM | POA: Diagnosis not present

## 2020-02-11 DIAGNOSIS — E039 Hypothyroidism, unspecified: Secondary | ICD-10-CM | POA: Diagnosis not present

## 2020-02-11 DIAGNOSIS — N184 Chronic kidney disease, stage 4 (severe): Secondary | ICD-10-CM | POA: Diagnosis not present

## 2020-02-11 DIAGNOSIS — M1991 Primary osteoarthritis, unspecified site: Secondary | ICD-10-CM | POA: Diagnosis not present

## 2020-02-11 DIAGNOSIS — J45909 Unspecified asthma, uncomplicated: Secondary | ICD-10-CM | POA: Diagnosis not present

## 2020-02-11 DIAGNOSIS — D631 Anemia in chronic kidney disease: Secondary | ICD-10-CM | POA: Diagnosis not present

## 2020-02-11 DIAGNOSIS — I5033 Acute on chronic diastolic (congestive) heart failure: Secondary | ICD-10-CM | POA: Diagnosis not present

## 2020-02-11 DIAGNOSIS — J438 Other emphysema: Secondary | ICD-10-CM | POA: Diagnosis not present

## 2020-02-11 DIAGNOSIS — I2510 Atherosclerotic heart disease of native coronary artery without angina pectoris: Secondary | ICD-10-CM | POA: Diagnosis not present

## 2020-02-11 DIAGNOSIS — I130 Hypertensive heart and chronic kidney disease with heart failure and stage 1 through stage 4 chronic kidney disease, or unspecified chronic kidney disease: Secondary | ICD-10-CM | POA: Diagnosis not present

## 2020-02-16 DIAGNOSIS — J45909 Unspecified asthma, uncomplicated: Secondary | ICD-10-CM | POA: Diagnosis not present

## 2020-02-16 DIAGNOSIS — I2510 Atherosclerotic heart disease of native coronary artery without angina pectoris: Secondary | ICD-10-CM | POA: Diagnosis not present

## 2020-02-16 DIAGNOSIS — J438 Other emphysema: Secondary | ICD-10-CM | POA: Diagnosis not present

## 2020-02-16 DIAGNOSIS — N184 Chronic kidney disease, stage 4 (severe): Secondary | ICD-10-CM | POA: Diagnosis not present

## 2020-02-16 DIAGNOSIS — E039 Hypothyroidism, unspecified: Secondary | ICD-10-CM | POA: Diagnosis not present

## 2020-02-16 DIAGNOSIS — M1991 Primary osteoarthritis, unspecified site: Secondary | ICD-10-CM | POA: Diagnosis not present

## 2020-02-16 DIAGNOSIS — I130 Hypertensive heart and chronic kidney disease with heart failure and stage 1 through stage 4 chronic kidney disease, or unspecified chronic kidney disease: Secondary | ICD-10-CM | POA: Diagnosis not present

## 2020-02-16 DIAGNOSIS — D631 Anemia in chronic kidney disease: Secondary | ICD-10-CM | POA: Diagnosis not present

## 2020-02-16 DIAGNOSIS — I5033 Acute on chronic diastolic (congestive) heart failure: Secondary | ICD-10-CM | POA: Diagnosis not present

## 2020-02-17 DIAGNOSIS — D631 Anemia in chronic kidney disease: Secondary | ICD-10-CM | POA: Diagnosis not present

## 2020-02-17 DIAGNOSIS — J45909 Unspecified asthma, uncomplicated: Secondary | ICD-10-CM | POA: Diagnosis not present

## 2020-02-17 DIAGNOSIS — I2510 Atherosclerotic heart disease of native coronary artery without angina pectoris: Secondary | ICD-10-CM | POA: Diagnosis not present

## 2020-02-17 DIAGNOSIS — N184 Chronic kidney disease, stage 4 (severe): Secondary | ICD-10-CM | POA: Diagnosis not present

## 2020-02-17 DIAGNOSIS — M1991 Primary osteoarthritis, unspecified site: Secondary | ICD-10-CM | POA: Diagnosis not present

## 2020-02-17 DIAGNOSIS — E039 Hypothyroidism, unspecified: Secondary | ICD-10-CM | POA: Diagnosis not present

## 2020-02-17 DIAGNOSIS — I130 Hypertensive heart and chronic kidney disease with heart failure and stage 1 through stage 4 chronic kidney disease, or unspecified chronic kidney disease: Secondary | ICD-10-CM | POA: Diagnosis not present

## 2020-02-17 DIAGNOSIS — I5033 Acute on chronic diastolic (congestive) heart failure: Secondary | ICD-10-CM | POA: Diagnosis not present

## 2020-02-17 DIAGNOSIS — J438 Other emphysema: Secondary | ICD-10-CM | POA: Diagnosis not present

## 2020-02-18 DIAGNOSIS — I2510 Atherosclerotic heart disease of native coronary artery without angina pectoris: Secondary | ICD-10-CM | POA: Diagnosis not present

## 2020-02-18 DIAGNOSIS — N184 Chronic kidney disease, stage 4 (severe): Secondary | ICD-10-CM | POA: Diagnosis not present

## 2020-02-18 DIAGNOSIS — I130 Hypertensive heart and chronic kidney disease with heart failure and stage 1 through stage 4 chronic kidney disease, or unspecified chronic kidney disease: Secondary | ICD-10-CM | POA: Diagnosis not present

## 2020-02-18 DIAGNOSIS — J45909 Unspecified asthma, uncomplicated: Secondary | ICD-10-CM | POA: Diagnosis not present

## 2020-02-18 DIAGNOSIS — D631 Anemia in chronic kidney disease: Secondary | ICD-10-CM | POA: Diagnosis not present

## 2020-02-18 DIAGNOSIS — J438 Other emphysema: Secondary | ICD-10-CM | POA: Diagnosis not present

## 2020-02-18 DIAGNOSIS — E039 Hypothyroidism, unspecified: Secondary | ICD-10-CM | POA: Diagnosis not present

## 2020-02-18 DIAGNOSIS — M1991 Primary osteoarthritis, unspecified site: Secondary | ICD-10-CM | POA: Diagnosis not present

## 2020-02-18 DIAGNOSIS — I5033 Acute on chronic diastolic (congestive) heart failure: Secondary | ICD-10-CM | POA: Diagnosis not present

## 2020-02-23 DIAGNOSIS — D631 Anemia in chronic kidney disease: Secondary | ICD-10-CM | POA: Diagnosis not present

## 2020-02-23 DIAGNOSIS — M1991 Primary osteoarthritis, unspecified site: Secondary | ICD-10-CM | POA: Diagnosis not present

## 2020-02-23 DIAGNOSIS — J45909 Unspecified asthma, uncomplicated: Secondary | ICD-10-CM | POA: Diagnosis not present

## 2020-02-23 DIAGNOSIS — I5033 Acute on chronic diastolic (congestive) heart failure: Secondary | ICD-10-CM | POA: Diagnosis not present

## 2020-02-23 DIAGNOSIS — I130 Hypertensive heart and chronic kidney disease with heart failure and stage 1 through stage 4 chronic kidney disease, or unspecified chronic kidney disease: Secondary | ICD-10-CM | POA: Diagnosis not present

## 2020-02-23 DIAGNOSIS — N184 Chronic kidney disease, stage 4 (severe): Secondary | ICD-10-CM | POA: Diagnosis not present

## 2020-02-23 DIAGNOSIS — J438 Other emphysema: Secondary | ICD-10-CM | POA: Diagnosis not present

## 2020-02-23 DIAGNOSIS — E039 Hypothyroidism, unspecified: Secondary | ICD-10-CM | POA: Diagnosis not present

## 2020-02-23 DIAGNOSIS — I2510 Atherosclerotic heart disease of native coronary artery without angina pectoris: Secondary | ICD-10-CM | POA: Diagnosis not present

## 2020-02-24 DIAGNOSIS — I130 Hypertensive heart and chronic kidney disease with heart failure and stage 1 through stage 4 chronic kidney disease, or unspecified chronic kidney disease: Secondary | ICD-10-CM | POA: Diagnosis not present

## 2020-02-24 DIAGNOSIS — I5033 Acute on chronic diastolic (congestive) heart failure: Secondary | ICD-10-CM | POA: Diagnosis not present

## 2020-02-24 DIAGNOSIS — J45909 Unspecified asthma, uncomplicated: Secondary | ICD-10-CM | POA: Diagnosis not present

## 2020-02-24 DIAGNOSIS — E039 Hypothyroidism, unspecified: Secondary | ICD-10-CM | POA: Diagnosis not present

## 2020-02-24 DIAGNOSIS — D631 Anemia in chronic kidney disease: Secondary | ICD-10-CM | POA: Diagnosis not present

## 2020-02-24 DIAGNOSIS — M1991 Primary osteoarthritis, unspecified site: Secondary | ICD-10-CM | POA: Diagnosis not present

## 2020-02-24 DIAGNOSIS — J438 Other emphysema: Secondary | ICD-10-CM | POA: Diagnosis not present

## 2020-02-24 DIAGNOSIS — I2510 Atherosclerotic heart disease of native coronary artery without angina pectoris: Secondary | ICD-10-CM | POA: Diagnosis not present

## 2020-02-24 DIAGNOSIS — N184 Chronic kidney disease, stage 4 (severe): Secondary | ICD-10-CM | POA: Diagnosis not present

## 2020-02-26 DIAGNOSIS — E039 Hypothyroidism, unspecified: Secondary | ICD-10-CM | POA: Diagnosis not present

## 2020-02-26 DIAGNOSIS — I2510 Atherosclerotic heart disease of native coronary artery without angina pectoris: Secondary | ICD-10-CM | POA: Diagnosis not present

## 2020-02-26 DIAGNOSIS — I130 Hypertensive heart and chronic kidney disease with heart failure and stage 1 through stage 4 chronic kidney disease, or unspecified chronic kidney disease: Secondary | ICD-10-CM | POA: Diagnosis not present

## 2020-02-26 DIAGNOSIS — I5033 Acute on chronic diastolic (congestive) heart failure: Secondary | ICD-10-CM | POA: Diagnosis not present

## 2020-02-26 DIAGNOSIS — J45909 Unspecified asthma, uncomplicated: Secondary | ICD-10-CM | POA: Diagnosis not present

## 2020-02-26 DIAGNOSIS — M1991 Primary osteoarthritis, unspecified site: Secondary | ICD-10-CM | POA: Diagnosis not present

## 2020-02-26 DIAGNOSIS — D631 Anemia in chronic kidney disease: Secondary | ICD-10-CM | POA: Diagnosis not present

## 2020-02-26 DIAGNOSIS — N184 Chronic kidney disease, stage 4 (severe): Secondary | ICD-10-CM | POA: Diagnosis not present

## 2020-02-26 DIAGNOSIS — J438 Other emphysema: Secondary | ICD-10-CM | POA: Diagnosis not present

## 2020-03-02 ENCOUNTER — Other Ambulatory Visit: Payer: Self-pay | Admitting: Internal Medicine

## 2020-03-03 DIAGNOSIS — I130 Hypertensive heart and chronic kidney disease with heart failure and stage 1 through stage 4 chronic kidney disease, or unspecified chronic kidney disease: Secondary | ICD-10-CM | POA: Diagnosis not present

## 2020-03-03 DIAGNOSIS — J438 Other emphysema: Secondary | ICD-10-CM | POA: Diagnosis not present

## 2020-03-03 DIAGNOSIS — N184 Chronic kidney disease, stage 4 (severe): Secondary | ICD-10-CM | POA: Diagnosis not present

## 2020-03-03 DIAGNOSIS — I2510 Atherosclerotic heart disease of native coronary artery without angina pectoris: Secondary | ICD-10-CM | POA: Diagnosis not present

## 2020-03-03 DIAGNOSIS — I5033 Acute on chronic diastolic (congestive) heart failure: Secondary | ICD-10-CM | POA: Diagnosis not present

## 2020-03-03 DIAGNOSIS — J45909 Unspecified asthma, uncomplicated: Secondary | ICD-10-CM | POA: Diagnosis not present

## 2020-03-03 DIAGNOSIS — D631 Anemia in chronic kidney disease: Secondary | ICD-10-CM | POA: Diagnosis not present

## 2020-03-03 DIAGNOSIS — M1991 Primary osteoarthritis, unspecified site: Secondary | ICD-10-CM | POA: Diagnosis not present

## 2020-03-03 DIAGNOSIS — E039 Hypothyroidism, unspecified: Secondary | ICD-10-CM | POA: Diagnosis not present

## 2020-03-08 DIAGNOSIS — I5033 Acute on chronic diastolic (congestive) heart failure: Secondary | ICD-10-CM | POA: Diagnosis not present

## 2020-03-08 DIAGNOSIS — M1991 Primary osteoarthritis, unspecified site: Secondary | ICD-10-CM | POA: Diagnosis not present

## 2020-03-08 DIAGNOSIS — J438 Other emphysema: Secondary | ICD-10-CM | POA: Diagnosis not present

## 2020-03-08 DIAGNOSIS — N184 Chronic kidney disease, stage 4 (severe): Secondary | ICD-10-CM | POA: Diagnosis not present

## 2020-03-08 DIAGNOSIS — I2510 Atherosclerotic heart disease of native coronary artery without angina pectoris: Secondary | ICD-10-CM | POA: Diagnosis not present

## 2020-03-08 DIAGNOSIS — E039 Hypothyroidism, unspecified: Secondary | ICD-10-CM | POA: Diagnosis not present

## 2020-03-08 DIAGNOSIS — I130 Hypertensive heart and chronic kidney disease with heart failure and stage 1 through stage 4 chronic kidney disease, or unspecified chronic kidney disease: Secondary | ICD-10-CM | POA: Diagnosis not present

## 2020-03-08 DIAGNOSIS — D631 Anemia in chronic kidney disease: Secondary | ICD-10-CM | POA: Diagnosis not present

## 2020-03-08 DIAGNOSIS — J45909 Unspecified asthma, uncomplicated: Secondary | ICD-10-CM | POA: Diagnosis not present

## 2020-03-09 DIAGNOSIS — J449 Chronic obstructive pulmonary disease, unspecified: Secondary | ICD-10-CM | POA: Diagnosis not present

## 2020-03-09 DIAGNOSIS — F325 Major depressive disorder, single episode, in full remission: Secondary | ICD-10-CM | POA: Diagnosis not present

## 2020-03-09 DIAGNOSIS — I129 Hypertensive chronic kidney disease with stage 1 through stage 4 chronic kidney disease, or unspecified chronic kidney disease: Secondary | ICD-10-CM | POA: Diagnosis not present

## 2020-03-09 DIAGNOSIS — E7800 Pure hypercholesterolemia, unspecified: Secondary | ICD-10-CM | POA: Diagnosis not present

## 2020-03-09 DIAGNOSIS — E039 Hypothyroidism, unspecified: Secondary | ICD-10-CM | POA: Diagnosis not present

## 2020-03-09 DIAGNOSIS — M810 Age-related osteoporosis without current pathological fracture: Secondary | ICD-10-CM | POA: Diagnosis not present

## 2020-03-09 DIAGNOSIS — H35033 Hypertensive retinopathy, bilateral: Secondary | ICD-10-CM | POA: Diagnosis not present

## 2020-03-09 DIAGNOSIS — N184 Chronic kidney disease, stage 4 (severe): Secondary | ICD-10-CM | POA: Diagnosis not present

## 2020-03-15 DIAGNOSIS — D631 Anemia in chronic kidney disease: Secondary | ICD-10-CM | POA: Diagnosis not present

## 2020-03-15 DIAGNOSIS — I130 Hypertensive heart and chronic kidney disease with heart failure and stage 1 through stage 4 chronic kidney disease, or unspecified chronic kidney disease: Secondary | ICD-10-CM | POA: Diagnosis not present

## 2020-03-15 DIAGNOSIS — N184 Chronic kidney disease, stage 4 (severe): Secondary | ICD-10-CM | POA: Diagnosis not present

## 2020-03-15 DIAGNOSIS — I5033 Acute on chronic diastolic (congestive) heart failure: Secondary | ICD-10-CM | POA: Diagnosis not present

## 2020-03-15 DIAGNOSIS — I2510 Atherosclerotic heart disease of native coronary artery without angina pectoris: Secondary | ICD-10-CM | POA: Diagnosis not present

## 2020-03-15 DIAGNOSIS — E039 Hypothyroidism, unspecified: Secondary | ICD-10-CM | POA: Diagnosis not present

## 2020-03-15 DIAGNOSIS — J45909 Unspecified asthma, uncomplicated: Secondary | ICD-10-CM | POA: Diagnosis not present

## 2020-03-15 DIAGNOSIS — J438 Other emphysema: Secondary | ICD-10-CM | POA: Diagnosis not present

## 2020-03-15 DIAGNOSIS — M1991 Primary osteoarthritis, unspecified site: Secondary | ICD-10-CM | POA: Diagnosis not present

## 2020-03-21 DIAGNOSIS — Z940 Kidney transplant status: Secondary | ICD-10-CM | POA: Diagnosis not present

## 2020-03-21 DIAGNOSIS — N2581 Secondary hyperparathyroidism of renal origin: Secondary | ICD-10-CM | POA: Diagnosis not present

## 2020-03-22 DIAGNOSIS — R3989 Other symptoms and signs involving the genitourinary system: Secondary | ICD-10-CM | POA: Diagnosis not present

## 2020-03-22 DIAGNOSIS — N2581 Secondary hyperparathyroidism of renal origin: Secondary | ICD-10-CM | POA: Diagnosis not present

## 2020-03-22 DIAGNOSIS — Z940 Kidney transplant status: Secondary | ICD-10-CM | POA: Diagnosis not present

## 2020-03-24 DIAGNOSIS — M810 Age-related osteoporosis without current pathological fracture: Secondary | ICD-10-CM | POA: Diagnosis not present

## 2020-03-24 DIAGNOSIS — I129 Hypertensive chronic kidney disease with stage 1 through stage 4 chronic kidney disease, or unspecified chronic kidney disease: Secondary | ICD-10-CM | POA: Diagnosis not present

## 2020-03-24 DIAGNOSIS — I130 Hypertensive heart and chronic kidney disease with heart failure and stage 1 through stage 4 chronic kidney disease, or unspecified chronic kidney disease: Secondary | ICD-10-CM | POA: Diagnosis not present

## 2020-03-24 DIAGNOSIS — F325 Major depressive disorder, single episode, in full remission: Secondary | ICD-10-CM | POA: Diagnosis not present

## 2020-03-24 DIAGNOSIS — M1991 Primary osteoarthritis, unspecified site: Secondary | ICD-10-CM | POA: Diagnosis not present

## 2020-03-24 DIAGNOSIS — N184 Chronic kidney disease, stage 4 (severe): Secondary | ICD-10-CM | POA: Diagnosis not present

## 2020-03-24 DIAGNOSIS — D631 Anemia in chronic kidney disease: Secondary | ICD-10-CM | POA: Diagnosis not present

## 2020-03-24 DIAGNOSIS — J449 Chronic obstructive pulmonary disease, unspecified: Secondary | ICD-10-CM | POA: Diagnosis not present

## 2020-03-24 DIAGNOSIS — I5033 Acute on chronic diastolic (congestive) heart failure: Secondary | ICD-10-CM | POA: Diagnosis not present

## 2020-03-24 DIAGNOSIS — I2510 Atherosclerotic heart disease of native coronary artery without angina pectoris: Secondary | ICD-10-CM | POA: Diagnosis not present

## 2020-03-24 DIAGNOSIS — E039 Hypothyroidism, unspecified: Secondary | ICD-10-CM | POA: Diagnosis not present

## 2020-03-24 DIAGNOSIS — H35033 Hypertensive retinopathy, bilateral: Secondary | ICD-10-CM | POA: Diagnosis not present

## 2020-03-24 DIAGNOSIS — J45909 Unspecified asthma, uncomplicated: Secondary | ICD-10-CM | POA: Diagnosis not present

## 2020-03-24 DIAGNOSIS — J438 Other emphysema: Secondary | ICD-10-CM | POA: Diagnosis not present

## 2020-03-24 DIAGNOSIS — E7800 Pure hypercholesterolemia, unspecified: Secondary | ICD-10-CM | POA: Diagnosis not present

## 2020-03-27 DIAGNOSIS — J45909 Unspecified asthma, uncomplicated: Secondary | ICD-10-CM | POA: Diagnosis not present

## 2020-03-27 DIAGNOSIS — E039 Hypothyroidism, unspecified: Secondary | ICD-10-CM | POA: Diagnosis not present

## 2020-03-27 DIAGNOSIS — M1991 Primary osteoarthritis, unspecified site: Secondary | ICD-10-CM | POA: Diagnosis not present

## 2020-03-27 DIAGNOSIS — I130 Hypertensive heart and chronic kidney disease with heart failure and stage 1 through stage 4 chronic kidney disease, or unspecified chronic kidney disease: Secondary | ICD-10-CM | POA: Diagnosis not present

## 2020-03-27 DIAGNOSIS — J438 Other emphysema: Secondary | ICD-10-CM | POA: Diagnosis not present

## 2020-03-27 DIAGNOSIS — I2510 Atherosclerotic heart disease of native coronary artery without angina pectoris: Secondary | ICD-10-CM | POA: Diagnosis not present

## 2020-03-27 DIAGNOSIS — D631 Anemia in chronic kidney disease: Secondary | ICD-10-CM | POA: Diagnosis not present

## 2020-03-27 DIAGNOSIS — N184 Chronic kidney disease, stage 4 (severe): Secondary | ICD-10-CM | POA: Diagnosis not present

## 2020-03-27 DIAGNOSIS — I5033 Acute on chronic diastolic (congestive) heart failure: Secondary | ICD-10-CM | POA: Diagnosis not present

## 2020-03-29 DIAGNOSIS — N184 Chronic kidney disease, stage 4 (severe): Secondary | ICD-10-CM | POA: Diagnosis not present

## 2020-03-29 DIAGNOSIS — J438 Other emphysema: Secondary | ICD-10-CM | POA: Diagnosis not present

## 2020-03-29 DIAGNOSIS — D631 Anemia in chronic kidney disease: Secondary | ICD-10-CM | POA: Diagnosis not present

## 2020-03-29 DIAGNOSIS — I2510 Atherosclerotic heart disease of native coronary artery without angina pectoris: Secondary | ICD-10-CM | POA: Diagnosis not present

## 2020-03-29 DIAGNOSIS — E039 Hypothyroidism, unspecified: Secondary | ICD-10-CM | POA: Diagnosis not present

## 2020-03-29 DIAGNOSIS — I130 Hypertensive heart and chronic kidney disease with heart failure and stage 1 through stage 4 chronic kidney disease, or unspecified chronic kidney disease: Secondary | ICD-10-CM | POA: Diagnosis not present

## 2020-03-29 DIAGNOSIS — J45909 Unspecified asthma, uncomplicated: Secondary | ICD-10-CM | POA: Diagnosis not present

## 2020-03-29 DIAGNOSIS — M1991 Primary osteoarthritis, unspecified site: Secondary | ICD-10-CM | POA: Diagnosis not present

## 2020-03-29 DIAGNOSIS — I5033 Acute on chronic diastolic (congestive) heart failure: Secondary | ICD-10-CM | POA: Diagnosis not present

## 2020-03-30 DIAGNOSIS — I10 Essential (primary) hypertension: Secondary | ICD-10-CM | POA: Diagnosis not present

## 2020-03-30 DIAGNOSIS — R3989 Other symptoms and signs involving the genitourinary system: Secondary | ICD-10-CM | POA: Diagnosis not present

## 2020-03-30 DIAGNOSIS — N2581 Secondary hyperparathyroidism of renal origin: Secondary | ICD-10-CM | POA: Diagnosis not present

## 2020-03-30 DIAGNOSIS — N189 Chronic kidney disease, unspecified: Secondary | ICD-10-CM | POA: Diagnosis not present

## 2020-03-30 DIAGNOSIS — D631 Anemia in chronic kidney disease: Secondary | ICD-10-CM | POA: Diagnosis not present

## 2020-03-30 DIAGNOSIS — Z940 Kidney transplant status: Secondary | ICD-10-CM | POA: Diagnosis not present

## 2020-04-06 DIAGNOSIS — F329 Major depressive disorder, single episode, unspecified: Secondary | ICD-10-CM | POA: Diagnosis not present

## 2020-04-06 DIAGNOSIS — Z7901 Long term (current) use of anticoagulants: Secondary | ICD-10-CM | POA: Diagnosis not present

## 2020-04-06 DIAGNOSIS — E039 Hypothyroidism, unspecified: Secondary | ICD-10-CM | POA: Diagnosis not present

## 2020-04-06 DIAGNOSIS — R197 Diarrhea, unspecified: Secondary | ICD-10-CM | POA: Diagnosis not present

## 2020-04-06 DIAGNOSIS — I10 Essential (primary) hypertension: Secondary | ICD-10-CM | POA: Diagnosis not present

## 2020-04-06 DIAGNOSIS — Z940 Kidney transplant status: Secondary | ICD-10-CM | POA: Diagnosis not present

## 2020-04-06 DIAGNOSIS — R112 Nausea with vomiting, unspecified: Secondary | ICD-10-CM | POA: Diagnosis not present

## 2020-04-06 DIAGNOSIS — Z79899 Other long term (current) drug therapy: Secondary | ICD-10-CM | POA: Diagnosis not present

## 2020-04-06 DIAGNOSIS — Z9049 Acquired absence of other specified parts of digestive tract: Secondary | ICD-10-CM | POA: Diagnosis not present

## 2020-04-06 DIAGNOSIS — K529 Noninfective gastroenteritis and colitis, unspecified: Secondary | ICD-10-CM | POA: Diagnosis not present

## 2020-04-11 ENCOUNTER — Encounter (HOSPITAL_COMMUNITY): Payer: Self-pay | Admitting: Emergency Medicine

## 2020-04-11 ENCOUNTER — Emergency Department (HOSPITAL_COMMUNITY): Payer: Medicare PPO

## 2020-04-11 ENCOUNTER — Observation Stay (HOSPITAL_COMMUNITY)
Admission: EM | Admit: 2020-04-11 | Discharge: 2020-04-13 | Disposition: A | Discharge status: Skilled Nursing Facility | Payer: Medicare PPO | Attending: Internal Medicine | Admitting: Internal Medicine

## 2020-04-11 ENCOUNTER — Observation Stay (HOSPITAL_COMMUNITY): Payer: Medicare PPO

## 2020-04-11 DIAGNOSIS — Z681 Body mass index (BMI) 19 or less, adult: Secondary | ICD-10-CM | POA: Insufficient documentation

## 2020-04-11 DIAGNOSIS — Z79899 Other long term (current) drug therapy: Secondary | ICD-10-CM | POA: Insufficient documentation

## 2020-04-11 DIAGNOSIS — M79602 Pain in left arm: Secondary | ICD-10-CM | POA: Diagnosis not present

## 2020-04-11 DIAGNOSIS — I5032 Chronic diastolic (congestive) heart failure: Secondary | ICD-10-CM | POA: Diagnosis present

## 2020-04-11 DIAGNOSIS — Z7189 Other specified counseling: Secondary | ICD-10-CM

## 2020-04-11 DIAGNOSIS — Z86718 Personal history of other venous thrombosis and embolism: Secondary | ICD-10-CM | POA: Insufficient documentation

## 2020-04-11 DIAGNOSIS — F419 Anxiety disorder, unspecified: Secondary | ICD-10-CM | POA: Diagnosis not present

## 2020-04-11 DIAGNOSIS — Z20822 Contact with and (suspected) exposure to covid-19: Secondary | ICD-10-CM | POA: Diagnosis not present

## 2020-04-11 DIAGNOSIS — R531 Weakness: Principal | ICD-10-CM

## 2020-04-11 DIAGNOSIS — Z515 Encounter for palliative care: Secondary | ICD-10-CM

## 2020-04-11 DIAGNOSIS — Z7951 Long term (current) use of inhaled steroids: Secondary | ICD-10-CM | POA: Insufficient documentation

## 2020-04-11 DIAGNOSIS — D631 Anemia in chronic kidney disease: Secondary | ICD-10-CM | POA: Insufficient documentation

## 2020-04-11 DIAGNOSIS — Z9049 Acquired absence of other specified parts of digestive tract: Secondary | ICD-10-CM | POA: Insufficient documentation

## 2020-04-11 DIAGNOSIS — M50222 Other cervical disc displacement at C5-C6 level: Secondary | ICD-10-CM | POA: Diagnosis not present

## 2020-04-11 DIAGNOSIS — E876 Hypokalemia: Secondary | ICD-10-CM | POA: Insufficient documentation

## 2020-04-11 DIAGNOSIS — I2510 Atherosclerotic heart disease of native coronary artery without angina pectoris: Secondary | ICD-10-CM | POA: Insufficient documentation

## 2020-04-11 DIAGNOSIS — R627 Adult failure to thrive: Secondary | ICD-10-CM | POA: Diagnosis not present

## 2020-04-11 DIAGNOSIS — F329 Major depressive disorder, single episode, unspecified: Secondary | ICD-10-CM | POA: Insufficient documentation

## 2020-04-11 DIAGNOSIS — I130 Hypertensive heart and chronic kidney disease with heart failure and stage 1 through stage 4 chronic kidney disease, or unspecified chronic kidney disease: Secondary | ICD-10-CM | POA: Insufficient documentation

## 2020-04-11 DIAGNOSIS — E43 Unspecified severe protein-calorie malnutrition: Secondary | ICD-10-CM | POA: Diagnosis not present

## 2020-04-11 DIAGNOSIS — Z7901 Long term (current) use of anticoagulants: Secondary | ICD-10-CM | POA: Insufficient documentation

## 2020-04-11 DIAGNOSIS — Z885 Allergy status to narcotic agent status: Secondary | ICD-10-CM | POA: Insufficient documentation

## 2020-04-11 DIAGNOSIS — E860 Dehydration: Secondary | ICD-10-CM

## 2020-04-11 DIAGNOSIS — Z820 Family history of epilepsy and other diseases of the nervous system: Secondary | ICD-10-CM | POA: Insufficient documentation

## 2020-04-11 DIAGNOSIS — R509 Fever, unspecified: Secondary | ICD-10-CM | POA: Diagnosis not present

## 2020-04-11 DIAGNOSIS — E785 Hyperlipidemia, unspecified: Secondary | ICD-10-CM | POA: Diagnosis not present

## 2020-04-11 DIAGNOSIS — Z880 Allergy status to penicillin: Secondary | ICD-10-CM | POA: Insufficient documentation

## 2020-04-11 DIAGNOSIS — I10 Essential (primary) hypertension: Secondary | ICD-10-CM | POA: Diagnosis present

## 2020-04-11 DIAGNOSIS — M1990 Unspecified osteoarthritis, unspecified site: Secondary | ICD-10-CM | POA: Insufficient documentation

## 2020-04-11 DIAGNOSIS — J8410 Pulmonary fibrosis, unspecified: Secondary | ICD-10-CM | POA: Diagnosis not present

## 2020-04-11 DIAGNOSIS — Z940 Kidney transplant status: Secondary | ICD-10-CM | POA: Insufficient documentation

## 2020-04-11 DIAGNOSIS — R52 Pain, unspecified: Secondary | ICD-10-CM | POA: Diagnosis not present

## 2020-04-11 DIAGNOSIS — Z881 Allergy status to other antibiotic agents status: Secondary | ICD-10-CM | POA: Insufficient documentation

## 2020-04-11 DIAGNOSIS — M79601 Pain in right arm: Secondary | ICD-10-CM | POA: Diagnosis not present

## 2020-04-11 DIAGNOSIS — Z811 Family history of alcohol abuse and dependence: Secondary | ICD-10-CM | POA: Insufficient documentation

## 2020-04-11 DIAGNOSIS — E782 Mixed hyperlipidemia: Secondary | ICD-10-CM | POA: Diagnosis present

## 2020-04-11 DIAGNOSIS — S2249XD Multiple fractures of ribs, unspecified side, subsequent encounter for fracture with routine healing: Secondary | ICD-10-CM | POA: Diagnosis not present

## 2020-04-11 DIAGNOSIS — Z96641 Presence of right artificial hip joint: Secondary | ICD-10-CM | POA: Insufficient documentation

## 2020-04-11 DIAGNOSIS — Z888 Allergy status to other drugs, medicaments and biological substances status: Secondary | ICD-10-CM | POA: Insufficient documentation

## 2020-04-11 DIAGNOSIS — M79622 Pain in left upper arm: Secondary | ICD-10-CM | POA: Diagnosis not present

## 2020-04-11 DIAGNOSIS — E1122 Type 2 diabetes mellitus with diabetic chronic kidney disease: Secondary | ICD-10-CM | POA: Diagnosis not present

## 2020-04-11 DIAGNOSIS — E039 Hypothyroidism, unspecified: Secondary | ICD-10-CM | POA: Diagnosis not present

## 2020-04-11 DIAGNOSIS — Z8349 Family history of other endocrine, nutritional and metabolic diseases: Secondary | ICD-10-CM | POA: Insufficient documentation

## 2020-04-11 DIAGNOSIS — M79603 Pain in arm, unspecified: Secondary | ICD-10-CM | POA: Diagnosis not present

## 2020-04-11 DIAGNOSIS — R0902 Hypoxemia: Secondary | ICD-10-CM | POA: Diagnosis not present

## 2020-04-11 DIAGNOSIS — J449 Chronic obstructive pulmonary disease, unspecified: Secondary | ICD-10-CM | POA: Diagnosis not present

## 2020-04-11 DIAGNOSIS — E1169 Type 2 diabetes mellitus with other specified complication: Secondary | ICD-10-CM | POA: Diagnosis present

## 2020-04-11 DIAGNOSIS — N184 Chronic kidney disease, stage 4 (severe): Secondary | ICD-10-CM | POA: Diagnosis not present

## 2020-04-11 DIAGNOSIS — M79621 Pain in right upper arm: Secondary | ICD-10-CM | POA: Diagnosis not present

## 2020-04-11 DIAGNOSIS — R5381 Other malaise: Secondary | ICD-10-CM | POA: Insufficient documentation

## 2020-04-11 DIAGNOSIS — I700 Atherosclerosis of aorta: Secondary | ICD-10-CM | POA: Diagnosis not present

## 2020-04-11 DIAGNOSIS — M50322 Other cervical disc degeneration at C5-C6 level: Secondary | ICD-10-CM | POA: Insufficient documentation

## 2020-04-11 DIAGNOSIS — K219 Gastro-esophageal reflux disease without esophagitis: Secondary | ICD-10-CM | POA: Insufficient documentation

## 2020-04-11 LAB — COMPREHENSIVE METABOLIC PANEL
ALT: 9 U/L (ref 0–44)
AST: 20 U/L (ref 15–41)
Albumin: 2.8 g/dL — ABNORMAL LOW (ref 3.5–5.0)
Alkaline Phosphatase: 67 U/L (ref 38–126)
Anion gap: 15 (ref 5–15)
BUN: 17 mg/dL (ref 8–23)
CO2: 19 mmol/L — ABNORMAL LOW (ref 22–32)
Calcium: 9.1 mg/dL (ref 8.9–10.3)
Chloride: 107 mmol/L (ref 98–111)
Creatinine, Ser: 1.83 mg/dL — ABNORMAL HIGH (ref 0.44–1.00)
GFR calc Af Amer: 28 mL/min — ABNORMAL LOW (ref >60)
GFR calc non Af Amer: 25 mL/min — ABNORMAL LOW (ref >60)
Glucose, Bld: 165 mg/dL — ABNORMAL HIGH (ref 70–99)
Potassium: 3 mmol/L — ABNORMAL LOW (ref 3.5–5.1)
Sodium: 141 mmol/L (ref 135–145)
Total Bilirubin: 1.2 mg/dL (ref 0.3–1.2)
Total Protein: 6.4 g/dL — ABNORMAL LOW (ref 6.5–8.1)

## 2020-04-11 LAB — SARS CORONAVIRUS 2 BY RT PCR (HOSPITAL ORDER, PERFORMED IN ~~LOC~~ HOSPITAL LAB): SARS Coronavirus 2: NEGATIVE

## 2020-04-11 LAB — CBC WITH DIFFERENTIAL/PLATELET
Abs Immature Granulocytes: 0.07 10*3/uL (ref 0.00–0.07)
Basophils Absolute: 0 10*3/uL (ref 0.0–0.1)
Basophils Relative: 0 %
Eosinophils Absolute: 0 10*3/uL (ref 0.0–0.5)
Eosinophils Relative: 0 %
HCT: 37.1 % (ref 36.0–46.0)
Hemoglobin: 12.2 g/dL (ref 12.0–15.0)
Immature Granulocytes: 1 %
Lymphocytes Relative: 16 %
Lymphs Abs: 1.7 10*3/uL (ref 0.7–4.0)
MCH: 32.1 pg (ref 26.0–34.0)
MCHC: 32.9 g/dL (ref 30.0–36.0)
MCV: 97.6 fL (ref 80.0–100.0)
Monocytes Absolute: 1.1 10*3/uL — ABNORMAL HIGH (ref 0.1–1.0)
Monocytes Relative: 10 %
Neutro Abs: 7.8 10*3/uL — ABNORMAL HIGH (ref 1.7–7.7)
Neutrophils Relative %: 73 %
Platelets: 224 10*3/uL (ref 150–400)
RBC: 3.8 MIL/uL — ABNORMAL LOW (ref 3.87–5.11)
RDW: 15.4 % (ref 11.5–15.5)
WBC: 10.7 10*3/uL — ABNORMAL HIGH (ref 4.0–10.5)
nRBC: 0 % (ref 0.0–0.2)

## 2020-04-11 LAB — URINALYSIS, ROUTINE W REFLEX MICROSCOPIC
Bilirubin Urine: NEGATIVE
Glucose, UA: NEGATIVE mg/dL
Ketones, ur: NEGATIVE mg/dL
Nitrite: NEGATIVE
Protein, ur: >300 mg/dL — AB
Specific Gravity, Urine: 1.025 (ref 1.005–1.030)
pH: 5.5 (ref 5.0–8.0)

## 2020-04-11 LAB — CK: Total CK: 38 U/L (ref 38–234)

## 2020-04-11 LAB — URINALYSIS, MICROSCOPIC (REFLEX)

## 2020-04-11 LAB — LACTIC ACID, PLASMA: Lactic Acid, Venous: 1.8 mmol/L (ref 0.5–1.9)

## 2020-04-11 MED ORDER — RIVAROXABAN 10 MG PO TABS: 10.0000 mg | ORAL_TABLET | Freq: Every evening | ORAL | Status: DC

## 2020-04-11 MED ORDER — APIXABAN 2.5 MG PO TABS
2.5000 mg | ORAL_TABLET | Freq: Two times a day (BID) | ORAL | Status: DC
  Administered 2020-04-11 – 2020-04-13 (×4): 2.5 mg via ORAL
  Filled 2020-04-11 (×6): qty 1

## 2020-04-11 MED ORDER — CLONIDINE HCL 0.1 MG PO TABS
0.1000 mg | ORAL_TABLET | Freq: Two times a day (BID) | ORAL | Status: DC
  Administered 2020-04-11 – 2020-04-12 (×2): 0.1 mg via ORAL
  Filled 2020-04-11 (×2): qty 1

## 2020-04-11 MED ORDER — GLUCERNA PO LIQD: 237.0000 mL | Freq: Two times a day (BID) | ORAL | Status: DC

## 2020-04-11 MED ORDER — PANTOPRAZOLE SODIUM 40 MG PO TBEC
40.0000 mg | DELAYED_RELEASE_TABLET | Freq: Every day | ORAL | Status: DC
  Administered 2020-04-12 – 2020-04-13 (×2): 40 mg via ORAL
  Filled 2020-04-11 (×2): qty 1

## 2020-04-11 MED ORDER — CYCLOSPORINE MODIFIED (NEORAL) 100 MG PO CAPS
100.0000 mg | ORAL_CAPSULE | Freq: Two times a day (BID) | ORAL | Status: DC
  Administered 2020-04-11 – 2020-04-13 (×4): 100 mg via ORAL
  Filled 2020-04-11 (×6): qty 1

## 2020-04-11 MED ORDER — METOCLOPRAMIDE HCL 5 MG PO TABS
5.0000 mg | ORAL_TABLET | Freq: Four times a day (QID) | ORAL | Status: DC
  Administered 2020-04-11 – 2020-04-13 (×8): 5 mg via ORAL
  Filled 2020-04-11 (×10): qty 1

## 2020-04-11 MED ORDER — ACETAMINOPHEN 650 MG RE SUPP: 650.0000 mg | Freq: Four times a day (QID) | RECTAL | Status: DC | PRN

## 2020-04-11 MED ORDER — SODIUM CHLORIDE 0.9 % IV BOLUS
1000.0000 mL | Freq: Once | INTRAVENOUS | Status: AC
  Administered 2020-04-11: 1000 mL via INTRAVENOUS

## 2020-04-11 MED ORDER — OXYBUTYNIN CHLORIDE ER 10 MG PO TB24
10.0000 mg | ORAL_TABLET | Freq: Every day | ORAL | Status: DC
  Administered 2020-04-12 – 2020-04-13 (×2): 10 mg via ORAL
  Filled 2020-04-11 (×2): qty 1

## 2020-04-11 MED ORDER — POLYETHYLENE GLYCOL 3350 17 G PO PACK: 17.0000 g | PACK | Freq: Every day | ORAL | Status: DC | PRN

## 2020-04-11 MED ORDER — ONDANSETRON HCL 4 MG PO TABS: 4.0000 mg | ORAL_TABLET | Freq: Four times a day (QID) | ORAL | Status: DC | PRN

## 2020-04-11 MED ORDER — HYDRALAZINE HCL 20 MG/ML IJ SOLN
5.0000 mg | INTRAMUSCULAR | Status: DC | PRN
  Administered 2020-04-11: 5 mg via INTRAVENOUS
  Filled 2020-04-11: qty 1

## 2020-04-11 MED ORDER — LUTEIN-ZEAXANTHIN 25-5 MG PO CAPS: 1.0000 | ORAL_CAPSULE | Freq: Every day | ORAL | Status: DC

## 2020-04-11 MED ORDER — FENTANYL CITRATE (PF) 100 MCG/2ML IJ SOLN
25.0000 ug | Freq: Once | INTRAMUSCULAR | Status: AC
  Administered 2020-04-11: 25 ug via INTRAVENOUS
  Filled 2020-04-11: qty 2

## 2020-04-11 MED ORDER — POTASSIUM CHLORIDE 10 MEQ/100ML IV SOLN
10.0000 meq | Freq: Once | INTRAVENOUS | Status: AC
  Administered 2020-04-11: 10 meq via INTRAVENOUS
  Filled 2020-04-11: qty 100

## 2020-04-11 MED ORDER — PRAVASTATIN SODIUM 40 MG PO TABS
40.0000 mg | ORAL_TABLET | Freq: Every day | ORAL | Status: DC
  Administered 2020-04-11 – 2020-04-13 (×3): 40 mg via ORAL
  Filled 2020-04-11 (×3): qty 1

## 2020-04-11 MED ORDER — SODIUM CHLORIDE 0.9% FLUSH: 3.0000 mL | Freq: Once | INTRAVENOUS | Status: DC

## 2020-04-11 MED ORDER — FLUOXETINE HCL 20 MG PO CAPS
20.0000 mg | ORAL_CAPSULE | Freq: Every day | ORAL | Status: DC
  Administered 2020-04-11 – 2020-04-13 (×3): 20 mg via ORAL
  Filled 2020-04-11 (×4): qty 1

## 2020-04-11 MED ORDER — DOCUSATE SODIUM 100 MG PO CAPS
100.0000 mg | ORAL_CAPSULE | Freq: Two times a day (BID) | ORAL | Status: DC
  Administered 2020-04-11 – 2020-04-13 (×4): 100 mg via ORAL
  Filled 2020-04-11 (×5): qty 1

## 2020-04-11 MED ORDER — HYDROCORTISONE NA SUCCINATE PF 100 MG IJ SOLR
50.0000 mg | Freq: Four times a day (QID) | INTRAMUSCULAR | Status: AC
  Administered 2020-04-11 – 2020-04-12 (×3): 50 mg via INTRAVENOUS
  Filled 2020-04-11 (×3): qty 2

## 2020-04-11 MED ORDER — BISACODYL 5 MG PO TBEC: 5.0000 mg | DELAYED_RELEASE_TABLET | Freq: Every day | ORAL | Status: DC | PRN

## 2020-04-11 MED ORDER — METHOCARBAMOL 1000 MG/10ML IJ SOLN
500.0000 mg | Freq: Four times a day (QID) | INTRAVENOUS | Status: DC | PRN
  Filled 2020-04-11: qty 5

## 2020-04-11 MED ORDER — ACETAMINOPHEN 325 MG PO TABS: 650.0000 mg | ORAL_TABLET | Freq: Four times a day (QID) | ORAL | Status: DC | PRN

## 2020-04-11 MED ORDER — FAMOTIDINE 20 MG PO TABS
20.0000 mg | ORAL_TABLET | Freq: Every day | ORAL | Status: DC
  Administered 2020-04-11 – 2020-04-13 (×3): 20 mg via ORAL
  Filled 2020-04-11 (×3): qty 1

## 2020-04-11 MED ORDER — PREDNISONE 5 MG PO TABS
5.0000 mg | ORAL_TABLET | Freq: Every day | ORAL | Status: DC
  Administered 2020-04-12 – 2020-04-13 (×2): 5 mg via ORAL
  Filled 2020-04-11 (×3): qty 1

## 2020-04-11 MED ORDER — ALBUTEROL SULFATE (2.5 MG/3ML) 0.083% IN NEBU: 2.5000 mg | INHALATION_SOLUTION | Freq: Four times a day (QID) | RESPIRATORY_TRACT | Status: DC | PRN

## 2020-04-11 MED ORDER — LEVOTHYROXINE SODIUM 75 MCG PO TABS
75.0000 ug | ORAL_TABLET | Freq: Every day | ORAL | Status: DC
  Administered 2020-04-12 – 2020-04-13 (×2): 75 ug via ORAL
  Filled 2020-04-11 (×2): qty 1

## 2020-04-11 MED ORDER — LORATADINE 10 MG PO TABS
10.0000 mg | ORAL_TABLET | Freq: Every day | ORAL | Status: DC
  Administered 2020-04-12 – 2020-04-13 (×2): 10 mg via ORAL
  Filled 2020-04-11 (×2): qty 1

## 2020-04-11 MED ORDER — MIRABEGRON ER 25 MG PO TB24
50.0000 mg | ORAL_TABLET | Freq: Every evening | ORAL | Status: DC
  Administered 2020-04-11 – 2020-04-13 (×3): 50 mg via ORAL
  Filled 2020-04-11 (×2): qty 2
  Filled 2020-04-11: qty 1

## 2020-04-11 MED ORDER — MOMETASONE FURO-FORMOTEROL FUM 200-5 MCG/ACT IN AERO
2.0000 | INHALATION_SPRAY | Freq: Two times a day (BID) | RESPIRATORY_TRACT | Status: DC
  Administered 2020-04-12 – 2020-04-13 (×5): 2 via RESPIRATORY_TRACT
  Filled 2020-04-11: qty 8.8

## 2020-04-11 MED ORDER — SULFAMETHOXAZOLE-TRIMETHOPRIM 400-80 MG PO TABS
1.0000 | ORAL_TABLET | ORAL | Status: DC
  Administered 2020-04-11 – 2020-04-13 (×2): 1 via ORAL
  Filled 2020-04-11 (×3): qty 1

## 2020-04-11 MED ORDER — LACTATED RINGERS IV SOLN: INTRAVENOUS | Status: DC

## 2020-04-11 MED ORDER — ONDANSETRON HCL 4 MG/2ML IJ SOLN: 4.0000 mg | Freq: Four times a day (QID) | INTRAMUSCULAR | Status: DC | PRN

## 2020-04-11 MED ORDER — AZATHIOPRINE 50 MG PO TABS
50.0000 mg | ORAL_TABLET | Freq: Two times a day (BID) | ORAL | Status: DC
  Administered 2020-04-11 – 2020-04-13 (×4): 50 mg via ORAL
  Filled 2020-04-11 (×6): qty 1

## 2020-04-11 NOTE — ED Notes (Signed)
Attempted to call report x 1  

## 2020-04-11 NOTE — ED Provider Notes (Signed)
[facility] EMERGENCY DEPARTMENT Provider Note   CSN: [id number] Arrival date & time: 04/11/20  1011     History Chief Complaint  Patient presents with  . Arm Pain    Patricia [name] is a 86 y.o. female.  HPI Patient presents with generalized weakness and pain in her elbows and forearms.  Reportedly had [place] the beach last [place].  Has been feeling bad reportedly went to the hospital there.  Patient's husband states she was given 2 medicines for her abdomen but does not know what they are.  Reportedly was feeling weak but discussed with patient and her husband was not having abdominal pain nausea vomiting or diarrhea.  Is coming home and is really been unable to get out of bed.  Fever up to 102 with EMS today.  Has been sitting in bed with just her arms crossed over her body.  Really cannot arms because of pain at the elbows and forearms.  Does have history of kidney transplant.  No cough.  She has had her Covid vaccinations.    Past Medical History:  Diagnosis Date  . Abnormal gait   . Anxiety   . Arthritis   . Asthma   . COPD, mild ([place])   . Coronary artery disease    pt denies  . Depression   . Diverticulitis   . Dizziness   . DVT (deep venous thrombosis) ([place])   . GERD (gastroesophageal reflux disease)   . Hx of kidney transplant    right  . Hypertension   . Hypothyroidism   . Multiple allergies   . Pneumonia   . Pre-diabetes   . Reflux   . Renal disorder    had kidney transplant in 2009    Patient Active Problem List   Diagnosis Date Noted  . History of anemia due to CKD 12/31/2019  . Anemia in chronic kidney disease (CKD) 12/31/2019  . Generalized weakness 12/30/2019  . Cholecystitis with cholelithiasis 07/01/2018  . Postoperative anemia due to acute blood loss 02/15/2016  . Hyperlipidemia 02/15/2016  . Displaced fracture of right femoral neck ([place]) 02/10/2016  . Chronic diastolic CHF (congestive heart failure) ([place]) 02/08/2016  .  Closed right hip fracture ([place]) 02/08/2016  . Hip fracture ([place]) 02/08/2016  . Acute diastolic CHF (congestive heart failure) ([place]) 05/27/2015  . Urinary incontinence 05/27/2015  . Escherichia coli urinary tract infection   . Other emphysema ([place])   . Hypokalemia   . Hypomagnesemia   . Sepsis secondary to UTI ([place]) 05/22/2015  . DVT, lower extremity, proximal ([place]) 03/02/2014  . Chronic kidney disease, stage 4, severely decreased GFR ([facility]) 03/02/2014  . Essential hypertension 03/02/2014  . DVT (deep venous thrombosis) ([place]) 03/01/2014  . Acute renal failure superimposed on stage 3 chronic kidney disease ([place]) 03/01/2014  . Gastroenteritis 11/22/2013  . Nausea & vomiting 11/22/2013  . Hypercalcemia 11/22/2013  . Infection due to ESBL-producing Escherichia coli 10/07/2013  . Septicemia due to E. coli ([place]) 10/07/2013  . Urinary tract infection 10/05/2013  . Sepsis ([place]) 10/05/2013  . History of renal transplant   . Hx of kidney transplant   . Hypertension   . Thyroid disease   . Multiple allergies   . Abnormal gait   . Reflux   . Diverticulitis   . Hypothyroidism   . Depression   . Anxiety   . COPD (chronic obstructive pulmonary disease) ([place])   . Dizziness   . Diabetes type 2, controlled ([place])  Past Surgical History:  Procedure Laterality Date  . ANTERIOR APPROACH HEMI HIP ARTHROPLASTY Right 02/10/2016   Procedure: TOTAL HIP ARTHROPLASTY ANTERIOR APPROACH;  Surgeon: Rod Can, MD;  Location: [facility];  Service: Orthopedics;  Laterality: Right;  . CHOLECYSTECTOMY N/A 07/01/2018   Procedure: LAPAROSCOPIC CHOLECYSTECTOMY WITH INTRAOPERATIVE CHOLANGIOGRAM;  Surgeon: Jovita Kussmaul, MD;  Location: [facility];  Service: General;  Laterality: N/A;  . COLONOSCOPY    . NEPHRECTOMY TRANSPLANTED ORGAN    . TUMOR REMOVAL     Brain      OB History   No obstetric history on file.     Family History  Problem Relation Age of Onset  . Alzheimer's disease Mother   . High  Cholesterol Mother   . Alcoholism Father     Social History   Tobacco Use  . Smoking status: Never Smoker  . Smokeless tobacco: Never Used  Vaping Use  . Vaping Use: Never used  Substance Use Topics  . Alcohol use: No  . Drug use: No    Home Medications Prior to Admission medications   Medication Sig Start Date End Date Taking? Authorizing Provider  acetaminophen (TYLENOL) 325 MG tablet Take 650 mg by mouth every 6 (six) hours as needed.   Yes [provider]  albuterol (VENTOLIN HFA) 108 (90 Base) MCG/ACT inhaler Inhale 2 puffs into the lungs every 6 (six) hours as needed for wheezing or shortness of breath. 01/21/20  Yes Lassen, Arlo C, PA-C  azaTHIOprine (IMURAN) 50 MG tablet Take 1 tablet (50 mg total) by mouth 2 (two) times daily. 01/21/20  Yes Lassen, Arlo C, PA-C  b complex vitamins tablet Take 1 tablet by mouth daily.   Yes [provider]  budesonide-formoterol (SYMBICORT) 160-4.5 MCG/ACT inhaler Inhale 2 puffs into the lungs in the morning and at bedtime. 01/21/20  Yes Lassen, Arlo C, PA-C  cetirizine (ZYRTEC) 10 MG tablet Take 1 tablet (10 mg total) by mouth daily. 01/21/20  Yes Lassen, Arlo C, PA-C  Cholecalciferol (VITAMIN D) 2000 UNITS tablet Take 2,000 Units by mouth every evening.    Yes [provider]  cloNIDine (CATAPRES) 0.1 MG tablet Take 1 tablet (0.1 mg total) by mouth 2 (two) times daily. 01/21/20  Yes Lassen, Arlo C, PA-C  cycloSPORINE modified (NEORAL) 100 MG capsule Take 1 capsule (100 mg total) by mouth 2 (two) times daily. 01/21/20  Yes Lassen, Arlo C, PA-C  DIPHENHYDRAMINE HCL, TOPICAL, (BENADRYL ITCH STOPPING) 2 % GEL Apply 1 application topically daily as needed for itch relief 01/21/20  Yes Oscar La, Arlo C, PA-C  famotidine (PEPCID) 20 MG tablet Take 20 mg by mouth daily with supper.   Yes [provider]  ferrous sulfate 325 (65 FE) MG tablet Take 325 mg by mouth daily with breakfast.   Yes [provider]  FLUoxetine  (PROZAC) 20 MG tablet Take 1 tablet (20 mg total) by mouth daily. 01/21/20  Yes Lassen, Arlo C, PA-C  fluticasone (FLONASE) 50 MCG/ACT nasal spray Place 1 spray into both nostrils daily as needed for allergies or rhinitis.   Yes [provider]  Glucerna (GLUCERNA) LIQD Take 237 mLs by mouth 2 (two) times daily.   Yes [provider]  levothyroxine (SYNTHROID) 75 MCG tablet Take 1 tablet (75 mcg total) by mouth daily. 01/21/20  Yes Lassen, Arlo C, PA-C  Lutein-Zeaxanthin 25-5 MG CAPS Take 1 capsule by mouth daily.   Yes [provider]  metoCLOPramide (REGLAN) 5 MG tablet Take 5 mg  by mouth 4 (four) times daily. 04/07/20  Yes [provider]  mirabegron ER (MYRBETRIQ) 50 MG TB24 tablet Take 1 tablet (50 mg total) by mouth every evening. 01/21/20  Yes Lassen, Arlo C, PA-C  omeprazole (PRILOSEC) 20 MG capsule Take 20 mg by mouth daily.   Yes [provider]  ondansetron (ZOFRAN) 4 MG tablet Take 4 mg by mouth every 4 (four) hours as needed for nausea/vomiting. 04/07/20  Yes [provider]  oxybutynin (DITROPAN-XL) 10 MG 24 hr tablet Take 1 tablet (10 mg total) by mouth daily. 01/21/20  Yes Lassen, Arlo C, PA-C  phenylephrine-shark liver oil-mineral oil-petrolatum (PREPARATION H) 0.25-3-14-71.9 % rectal ointment Place 1 application rectally 2 (two) times daily as needed for hemorrhoids.   Yes [provider]  pravastatin (PRAVACHOL) 40 MG tablet Take 40 mg by mouth daily.   Yes [provider]  predniSONE (DELTASONE) 5 MG tablet Take 1 tablet (5 mg total) by mouth daily with breakfast. 01/21/20  Yes Granville Lewis C, PA-C  sulfamethoxazole-trimethoprim (BACTRIM) 400-80 MG tablet Take 1 tablet by mouth every Monday, Wednesday, and Friday. 01/22/20  Yes Lassen, Arlo C, PA-C  XARELTO 10 MG TABS tablet Take 1 tablet (10 mg total) by mouth every evening. 01/21/20  Yes Oscar La, Arlo C, PA-C    Allergies    Macrolides and ketolides, Aricept [donepezil  hcl], Codeine, Morphine, Penicillin g, Penicillins, Statins, Streptomycin, Tetracycline, and Tetracyclines & related  Review of Systems   Review of Systems  Constitutional: Positive for appetite change, fatigue and fever.  HENT: Negative for congestion.   Respiratory: Negative for shortness of breath.   Gastrointestinal: Negative for abdominal pain.  Genitourinary: Negative for flank pain.  Musculoskeletal: Negative for back pain.       Elbow and forearm pain.  Skin: Negative for rash.  Neurological: Positive for weakness.  Psychiatric/Behavioral: Negative for confusion.    Physical Exam Updated Vital Signs BP (!) 145/78 (BP Location: Right Arm)   Pulse (!) 101   Temp 98.1 F (36.7 C) (Oral)   Resp 18   SpO2 100%   Physical Exam Vitals reviewed.  HENT:     Head: Atraumatic.  Eyes:     Extraocular Movements: Extraocular movements intact.  Cardiovascular:     Rate and Rhythm: Tachycardia present.  Pulmonary:     Breath sounds: No wheezing or rhonchi.  Abdominal:     Tenderness: There is no abdominal tenderness.  Musculoskeletal:        General: Tenderness present.     Cervical back: Neck supple.     Comments: Tenderness over bilateral forearms, particularly on right.  Pain with movement of bilateral elbows with particularly on right.  May have some mild wrist involvement also.  Skin:    Capillary Refill: Capillary refill takes less than 2 seconds.  Neurological:     Mental Status: She is alert and oriented to person, place, and time.     ED Results / Procedures / Treatments   Labs (all labs ordered are listed, but only abnormal results are displayed) Labs Reviewed  COMPREHENSIVE METABOLIC PANEL - Abnormal; Notable for the following components:      Result Value   Potassium 3.0 (*)    CO2 19 (*)    Glucose, Bld 165 (*)    Creatinine, Ser 1.83 (*)    Total Protein 6.4 (*)    Albumin 2.8 (*)    GFR calc non Af Amer 25 (*)    GFR calc Af Wyvonnia Lora  28 (*)    All  other components within normal limits  CBC WITH DIFFERENTIAL/PLATELET - Abnormal; Notable for the following components:   WBC 10.7 (*)    RBC 3.80 (*)    Neutro Abs 7.8 (*)    Monocytes Absolute 1.1 (*)    All other components within normal limits  CULTURE, BLOOD (ROUTINE X 2)  CULTURE, BLOOD (ROUTINE X 2)  LACTIC ACID, PLASMA  CK  URINALYSIS, ROUTINE W REFLEX MICROSCOPIC    EKG None  Radiology DG Chest 2 View  Result Date: 04/11/2020 CLINICAL DATA:  Fever, chills and body aches. EXAM: CHEST - 2 VIEW COMPARISON:  12/30/2019 FINDINGS: The cardiac silhouette, mediastinal and hilar contours are within normal limits and stable. Stable mild tortuosity and calcification of the thoracic aorta. Stable calcified granuloma in the left upper lobe. No acute pulmonary findings. No worrisome pulmonary lesions. The bony thorax is intact. Remote healed rib fractures are noted. IMPRESSION: No acute cardiopulmonary findings. Electronically Signed   By: Marijo Sanes M.D.   On: 04/11/2020 10:58    Procedures Procedures (including critical care time)  Medications Ordered in ED Medications  sodium chloride flush (NS) 0.9 % injection 3 mL (has no administration in time range)  potassium chloride 10 mEq in 100 mL IVPB (10 mEq Intravenous New Bag/Given 04/11/20 1505)  sodium chloride 0.9 % bolus 1,000 mL (1,000 mLs Intravenous New Bag/Given 04/11/20 1501)  fentaNYL (SUBLIMAZE) injection 25 mcg (25 mcg Intravenous Given 04/11/20 1455)    ED Course  I have reviewed the triage vital signs and the nursing notes.  Pertinent labs & imaging results that were available during my care of the patient were reviewed by me and considered in my medical decision making (see chart for details).    MDM Rules/Calculators/A&P                          Patient with reported fevers weakness and pain.  Recently questionable GI symptoms.  Urinalysis still pending but otherwise labs reassuring.  However patient still weak  and still cannot really bend her arms at the elbow.  Mild hypokalemia.  With continued pain and fevers I feels the patient would benefit from admission to the hospital for further evaluation and treatment. Final Clinical Impression(s) / ED Diagnoses Final diagnoses:  Dehydration  Hypokalemia  Pain in both upper extremities    Rx / DC Orders ED Discharge Orders    None       Davonna Belling, MD 04/11/20 1542

## 2020-04-11 NOTE — Progress Notes (Signed)
[facility] for Apixaban Indication: Hx of DVT on Xarelto as outpatient howerver switching due to renal function '  Allergies  Allergen Reactions   Macrolides And Ketolides Nausea Only   Aricept [Donepezil Hcl] Nausea And Vomiting   Codeine Nausea And Vomiting   Morphine Nausea And Vomiting   Penicillin G Nausea Only   Penicillins Nausea And Vomiting and Other (See Comments)    HEADACHE Has patient had a PCN reaction causing immediate rash, facial/tongue/throat swelling, SOB or lightheadedness with hypotension: unkn Has patient had a PCN reaction causing severe rash involving mucus membranes or skin necrosis: unkn Has patient had a PCN reaction that required hospitalization: unkn Has patient had a PCN reaction occurring within the last 10 years: unkn If all of the above answers are "NO", then may proceed with Cephalosporin use.    Statins Other (See Comments)    Doesn't remember   Streptomycin Other (See Comments)    headache    Tetracycline Nausea Only   Tetracyclines & Related Rash    Patient Measurements:   Heparin Dosing Weight:   Vital Signs: Temp: 98.1 F (36.7 C) (06/28 1021) Temp Source: Oral (06/28 1021) BP: 203/85 (06/28 1615) Pulse Rate: 85 (06/28 1620)  Labs: Recent Labs    04/11/20 1033 04/11/20 1130  HGB 12.2  --   HCT 37.1  --   PLT 224  --   CREATININE 1.83*  --   CKTOTAL  --  38    CrCl cannot be calculated (Unknown ideal weight.).   Medical History: Past Medical History:  Diagnosis Date   Abnormal gait    Anxiety    Arthritis    Asthma    COPD, mild ([facility])    Coronary artery disease    pt denies   Depression    Diverticulitis    Dizziness    DVT (deep venous thrombosis) ([facility])    GERD (gastroesophageal reflux disease)    Hypertension    Hypothyroidism    Multiple allergies    Pneumonia    Pre-diabetes    Reflux    Renal disorder    had kidney transplant in 2009     Medications:  Scheduled:   apixaban  2.5 mg Oral BID   azaTHIOprine  50 mg Oral BID   cloNIDine  0.1 mg Oral BID   cycloSPORINE modified  100 mg Oral BID   docusate sodium  100 mg Oral BID   famotidine  20 mg Oral Q supper   FLUoxetine  20 mg Oral Daily   hydrocortisone sod succinate (SOLU-CORTEF) inj  50 mg Intravenous Q6H   [START ON 04/12/2020] levothyroxine  75 mcg Oral Q0600   [START ON 04/12/2020] loratadine  10 mg Oral Daily   metoCLOPramide  5 mg Oral QID   mirabegron ER  50 mg Oral QPM   mometasone-formoterol  2 puff Inhalation BID   [START ON 04/12/2020] oxybutynin  10 mg Oral Daily   [START ON 04/12/2020] pantoprazole  40 mg Oral Daily   pravastatin  40 mg Oral Daily   [START ON 04/12/2020] predniSONE  5 mg Oral Q breakfast   sulfamethoxazole-trimethoprim  1 tablet Oral Q M,W,F    Assessment: Patient is a 73 yof that is being admitted for weakness. The patient has a hx of previous DVTs in the past and is on Xarelto at home for this. It appears she is taking the reduced dose of 10mg  daily for longterm AC. Pharmacy has been  asked to switch the patient to Apixaban due to decline in her renal function.   Goal of Therapy:  Monitor platelets by anticoagulation protocol: Yes   Plan:  - Patient last too her Xarelto on 6/25 - Will start Apixaban 2.5 mg PO BID tonight  - Monitor patient for s/s of bleeding and cbc as needed   Duanne Limerick PharmD. BCPS  04/11/2020,5:22 PM

## 2020-04-11 NOTE — H&P (Signed)
History and Physical    Patricia [name] LOV:[id number] DOB: April 24, 1932 DOA: 04/11/2020  PCP:  Patricia [name] Consultants:  Patricia [name] - orthopedics Patient coming from:  Home - lives with husband and son; NOK: Patricia [name], Patricia [name], [phone]  Chief Complaint: weakness  HPI: Patricia [name] is a 86 y.o. female with medical history significant of kidney transplant (2009); pre-DM; hypothyroidism; HTN; CAD; and mild COPD presenting with lethargy.  They were at the beach last week and she started feeling weak/tired.  They took her to the ER and they determined she had a problem in her stomach and they gave her NOS meds for that issue.  She took it about 2-3 days without obvious improvement.  Since then, she has been in bed.  She screams out in paid if you touch her arms.  They hurt from the B elbow to the hands.  It is equal and symmetric.  She wasn't active at the beach, stayed in the room other than going out for meals.  No fevers.  She has been in a wheelchair for years.  He is not aware of fevers. She was in [facility] for 16 days after her last hospitalization and he would prefer that she return there.    ED Course: Generally weak.  Was at the beach, went to the ER and treated with "GI issues" and given some meds.  Not eating or drinking, sitting in bed with her arms across her chest.  It is too painful to move her elbows.  Review of Systems: As per HPI; otherwise review of systems reviewed and negative.   Ambulatory Status:  Wheelchair  COVID Vaccine Status:   Complete  Past Medical History:  Diagnosis Date  . Abnormal gait   . Anxiety   . Arthritis   . Asthma   . COPD, mild ([place])   . Coronary artery disease    pt denies  . Depression   . Diverticulitis   . Dizziness   . DVT (deep venous thrombosis) ([place])   . GERD (gastroesophageal reflux disease)   . Hypertension   . Hypothyroidism   . Multiple allergies   . Pneumonia   . Pre-diabetes   . Reflux   . Renal disorder      had kidney transplant in 2009    Past Surgical History:  Procedure Laterality Date  . ANTERIOR APPROACH HEMI HIP ARTHROPLASTY Right 02/10/2016   Procedure: TOTAL HIP ARTHROPLASTY ANTERIOR APPROACH;  Surgeon: Patricia Can, MD;  Location: [place];  Service: Orthopedics;  Laterality: Right;  . CHOLECYSTECTOMY N/A 07/01/2018   Procedure: LAPAROSCOPIC CHOLECYSTECTOMY WITH INTRAOPERATIVE CHOLANGIOGRAM;  Surgeon: Patricia Kussmaul, MD;  Location: [place];  Service: General;  Laterality: N/A;  . COLONOSCOPY    . NEPHRECTOMY TRANSPLANTED ORGAN    . TUMOR REMOVAL     Brain     Social History   Socioeconomic History  . Marital status: Married    Spouse name: Patricia [name]  . Number of children: 2  . Years of education: college  . Highest education level: Not on file  Occupational History  . Occupation: retired  Tobacco Use  . Smoking status: Never Smoker  . Smokeless tobacco: Never Used  Vaping Use  . Vaping Use: Never used  Substance and Sexual Activity  . Alcohol use: No  . Drug use: No  . Sexual activity: Not on file  Other Topics Concern  . Not on file  Social History Narrative   Patient lives at home with her  husband Patricia [name]). Patient is retired. Patient has two children. Patient has college education.   Right handed.   Caffeine- None- Tea during day one cup   Social Determinants of Health   Financial Resource Strain:   . Difficulty of Paying Living Expenses:   Food Insecurity:   . Worried About Charity fundraiser in the Last Year:   . Arboriculturist in the Last Year:   Transportation Needs:   . Film/video editor (Medical):   Marland Kitchen Lack of Transportation (Non-Medical):   Physical Activity:   . Days of Exercise per Week:   . Minutes of Exercise per Session:   Stress:   . Feeling of Stress :   Social Connections:   . Frequency of Communication with Friends and Family:   . Frequency of Social Gatherings with Friends and Family:   . Attends Religious Services:   . Active  Member of Clubs or Organizations:   . Attends Archivist Meetings:   Marland Kitchen Marital Status:   Intimate Partner Violence:   . Fear of Current or Ex-Partner:   . Emotionally Abused:   Marland Kitchen Physically Abused:   . Sexually Abused:     Allergies  Allergen Reactions  . Macrolides And Ketolides Nausea Only  . Aricept [Donepezil Hcl] Nausea And Vomiting  . Codeine Nausea And Vomiting  . Morphine Nausea And Vomiting  . Penicillin G Nausea Only  . Penicillins Nausea And Vomiting and Other (See Comments)    HEADACHE Has patient had a PCN reaction causing immediate rash, facial/tongue/throat swelling, SOB or lightheadedness with hypotension: unkn Has patient had a PCN reaction causing severe rash involving mucus membranes or skin necrosis: unkn Has patient had a PCN reaction that required hospitalization: unkn Has patient had a PCN reaction occurring within the last 10 years: unkn If all of the above answers are "NO", then may proceed with Cephalosporin use.   . Statins Other (See Comments)    Doesn't remember  . Streptomycin Other (See Comments)    headache   . Tetracycline Nausea Only  . Tetracyclines & Related Rash    Family History  Problem Relation Age of Onset  . Alzheimer's disease Mother   . High Cholesterol Mother   . Alcoholism Father     Prior to Admission medications   Medication Sig Start Date End Date Taking? Authorizing Provider  acetaminophen (TYLENOL) 325 MG tablet Take 650 mg by mouth every 6 (six) hours as needed.   Yes [provider]  albuterol (VENTOLIN HFA) 108 (90 Base) MCG/ACT inhaler Inhale 2 puffs into the lungs every 6 (six) hours as needed for wheezing or shortness of breath. 01/21/20  Yes Lassen, Arlo C, PA-C  azaTHIOprine (IMURAN) 50 MG tablet Take 1 tablet (50 mg total) by mouth 2 (two) times daily. 01/21/20  Yes Lassen, Arlo C, PA-C  b complex vitamins tablet Take 1 tablet by mouth daily.   Yes [provider]  budesonide-formoterol  (SYMBICORT) 160-4.5 MCG/ACT inhaler Inhale 2 puffs into the lungs in the morning and at bedtime. 01/21/20  Yes Lassen, Arlo C, PA-C  cetirizine (ZYRTEC) 10 MG tablet Take 1 tablet (10 mg total) by mouth daily. 01/21/20  Yes Lassen, Arlo C, PA-C  Cholecalciferol (VITAMIN D) 2000 UNITS tablet Take 2,000 Units by mouth every evening.    Yes [provider]  cloNIDine (CATAPRES) 0.1 MG tablet Take 1 tablet (0.1 mg total) by mouth 2 (two) times daily. 01/21/20  Yes Wille Celeste,  PA-C  cycloSPORINE modified (NEORAL) 100 MG capsule Take 1 capsule (100 mg total) by mouth 2 (two) times daily. 01/21/20  Yes Lassen, Arlo C, PA-C  DIPHENHYDRAMINE HCL, TOPICAL, (BENADRYL ITCH STOPPING) 2 % GEL Apply 1 application topically daily as needed for itch relief 01/21/20  Yes Oscar La, Arlo C, PA-C  famotidine (PEPCID) 20 MG tablet Take 20 mg by mouth daily with supper.   Yes [provider]  ferrous sulfate 325 (65 FE) MG tablet Take 325 mg by mouth daily with breakfast.   Yes [provider]  FLUoxetine (PROZAC) 20 MG tablet Take 1 tablet (20 mg total) by mouth daily. 01/21/20  Yes Lassen, Arlo C, PA-C  fluticasone (FLONASE) 50 MCG/ACT nasal spray Place 1 spray into both nostrils daily as needed for allergies or rhinitis.   Yes [provider]  Glucerna (GLUCERNA) LIQD Take 237 mLs by mouth 2 (two) times daily.   Yes [provider]  levothyroxine (SYNTHROID) 75 MCG tablet Take 1 tablet (75 mcg total) by mouth daily. 01/21/20  Yes Lassen, Arlo C, PA-C  Lutein-Zeaxanthin 25-5 MG CAPS Take 1 capsule by mouth daily.   Yes [provider]  metoCLOPramide (REGLAN) 5 MG tablet Take 5 mg by mouth 4 (four) times daily. 04/07/20  Yes [provider]  mirabegron ER (MYRBETRIQ) 50 MG TB24 tablet Take 1 tablet (50 mg total) by mouth every evening. 01/21/20  Yes Lassen, Arlo C, PA-C  omeprazole (PRILOSEC) 20 MG capsule Take 20 mg by mouth daily.   Yes [provider]    ondansetron (ZOFRAN) 4 MG tablet Take 4 mg by mouth every 4 (four) hours as needed for nausea/vomiting. 04/07/20  Yes [provider]  oxybutynin (DITROPAN-XL) 10 MG 24 hr tablet Take 1 tablet (10 mg total) by mouth daily. 01/21/20  Yes Lassen, Arlo C, PA-C  phenylephrine-shark liver oil-mineral oil-petrolatum (PREPARATION H) 0.25-3-14-71.9 % rectal ointment Place 1 application rectally 2 (two) times daily as needed for hemorrhoids.   Yes [provider]  pravastatin (PRAVACHOL) 40 MG tablet Take 40 mg by mouth daily.   Yes [provider]  predniSONE (DELTASONE) 5 MG tablet Take 1 tablet (5 mg total) by mouth daily with breakfast. 01/21/20  Yes Granville Lewis C, PA-C  sulfamethoxazole-trimethoprim (BACTRIM) 400-80 MG tablet Take 1 tablet by mouth every Monday, Wednesday, and Friday. 01/22/20  Yes Lassen, Arlo C, PA-C  XARELTO 10 MG TABS tablet Take 1 tablet (10 mg total) by mouth every evening. 01/21/20  Yes Wille Celeste, PA-C    Physical Exam: Vitals:   04/11/20 1550 04/11/20 1605 04/11/20 1615 04/11/20 1620  BP:   (!) 203/85   Pulse: 82 84 85 85  Resp: 19 16 13  (!) 4  Temp:      TempSrc:      SpO2: 98% 98% 98% 99%      General:  Appears calm and comfortable and is NAD, generally weak; reports inability to move her head from side to side due to neck stiffness  Eyes:  PERRL, EOMI, normal lids, iris  ENT:  grossly normal hearing, lips & tongue, mmm; edentulous on top  Neck:  no LAD, masses or thyromegaly  Cardiovascular:  RRR, no m/r/g.   Respiratory:   CTA bilaterally with no wheezes/rales/rhonchi.  Normal respiratory effort.  Abdomen:  soft, NT, ND, NABS  Skin:  no rash or induration seen on limited exam  Musculoskeletal:  Decreased tone BUE/BLE, minimal apparent pain on passive ROM of B UE  from wrists to elbows, no bony abnormality  Psychiatric:  blunted mood and affect, speech sparse but appropriate  Neurologic:  CN 2-12 grossly intact, moves lower  extremities in coordinated fashion but weakly    Radiological Exams on Admission: DG Chest 2 View  Result Date: 04/11/2020 CLINICAL DATA:  Fever, chills and body aches. EXAM: CHEST - 2 VIEW COMPARISON:  12/30/2019 FINDINGS: The cardiac silhouette, mediastinal and hilar contours are within normal limits and stable. Stable mild tortuosity and calcification of the thoracic aorta. Stable calcified granuloma in the left upper lobe. No acute pulmonary findings. No worrisome pulmonary lesions. The bony thorax is intact. Remote healed rib fractures are noted. IMPRESSION: No acute cardiopulmonary findings. Electronically Signed   By: Marijo Sanes M.D.   On: 04/11/2020 10:58    EKG: not done   Labs on Admission: I have personally reviewed the available labs and imaging studies at the time of the admission.  Pertinent labs:   K+ 3.0  Glucose 165 BUN 17/Creatinine 1.83/GFR 25 - stable CK 38 Lactate 1.1, 1.8 WBC 10.7   Assessment/Plan Principal Problem:   Generalized weakness Active Problems:   Hypothyroidism   COPD (chronic obstructive pulmonary disease) (HCC)   Diabetes type 2, controlled (HCC)   Chronic kidney disease, stage 4, severely decreased GFR (HCC)   Essential hypertension   Chronic diastolic CHF (congestive heart failure) (HCC)   Hyperlipidemia   Bilateral arm pain     Generalized weakness -Patient presenting with generalized weakness, similar to prior presentation in March -She is also complaining of B lower arm pain although there are no clear physical exam findings to support this; she does endorse limited neck ROM so will perform CT of C-spine to ensure no radiculopathy as the source of her arm issue -This was initially presented as a significant change in baseline, but in discussion with her husband she is wheelchair dependent at baseline and lays on the couch all days most days; the only change has been in her reluctance in the last week to get out of bed and sit up  in the wheelchair at the table for meals as well as decreased PO intake -PT/OT consults ordered and she may again benefit from SNF placement -Will observe for now but she appears medically stable for placement whenever that [name] be arranged -TOC consult placed, and her husband prefers [employer] -She has not been eating well and does not appear to have taken meds since 6/25; she may be undergoing adrenal crisis so will give stress-dosed steroids for 3 doses and then resume PO prednisone on 6/29 -Palliative care consult requested -Continue Prozac, as depression may be contributing  Stage 4 CKD in the setting of prior renal transplant -While there was some concern raised by her husband about transplant failure, her renal function appears to be stable with stage 4 CKD at this time -Her husband posed the question of whether she was an appropriate candidate for HD, but it appears that she is too generally weak at this time to pursue this should the need arise -Further consideration is likely to depend on her progress at rehab -Continue Imuran, cyclosporine -Continue 3 times weekly Bactrim  HTN -Continue Catapres -Currently significantly elevated, likely from Clonidine withdrawal -Will cover with prn hydralazine  HLD -Continue Pravachol  DM -Prior A1c 5.1, not on medications -If fasting labs indicate hyperglycemia while giving higher dosed steroids, [name] consider treatment - but none now  Hypothyroidism -Normal recent TSH -Continue Synthroid at current dose for  now  Chronic diastolic CHF -2355 echo with grade 2 diastolic dysfunction -Appears to be compensated at this time  COPD -Continue Symbicort, Albuterol  H/o DVT -Chronic DVT on [place] in 2014 without acute DVT -Chronic but possibly worse DVT also present in 2018 -It is not clear that she needs lifelong AC without acute DVT -She needs alternative therapy given her advanced CKD - will change from Xarelto to Eliquis at this  time   Note: This patient has been tested and is pending for the novel coronavirus COVID-19.   DVT prophylaxis: Eliquis Code Status: DNR - confirmed with paper in chart/ACPs Family Communication: Husband was present throughout evalaution. Disposition Plan: She is anticipated to d/c to SNF and does not appear to have acute medical issues requiring her to remain hospitalized for more than a day or two Consults called: PT/OT/TOC team; palliative care Admission status: It is my clinical opinion that referral for OBSERVATION is reasonable and necessary in this patient based on the above information provided. The aforementioned taken together are felt to place the patient at high risk for further clinical deterioration. However it is anticipated that the patient may be medically stable for discharge from the hospital within 24 to 48 hours.   Karmen Bongo MD [facility]   How to contact the [facility] Attending or Consulting provider [place] or covering provider during after hours Friend, for this patient?  1. Check the care team in [facility] and look for a) attending/consulting [facility] provider listed and b) the [facility] team listed 2. Log into [email or web address] and use [place]'s universal password to access. If you do not have the password, please contact the hospital operator. 3. Locate the [facility] provider you are looking for under [facility] and page to a number that you [name] be directly reached. 4. If you still have difficulty reaching the provider, please page the [facility] (Director on Call) for the Hospitalists listed on amion for assistance.   04/11/2020, 4:49 PM

## 2020-04-11 NOTE — ED Triage Notes (Addendum)
Pt arrives via [facility] from home for c/o bilateral arm pain, body aches and fever. States she was at the beach last week and sick with a stomach bug, has been home in bed since Friday lying in same position. Ems temp 102. Pt a/ox4. Given 1g tylenol and 1 L NS by ems.

## 2020-04-12 DIAGNOSIS — M79601 Pain in right arm: Secondary | ICD-10-CM

## 2020-04-12 DIAGNOSIS — R531 Weakness: Secondary | ICD-10-CM | POA: Diagnosis not present

## 2020-04-12 DIAGNOSIS — M79602 Pain in left arm: Secondary | ICD-10-CM

## 2020-04-12 DIAGNOSIS — R627 Adult failure to thrive: Secondary | ICD-10-CM | POA: Diagnosis not present

## 2020-04-12 DIAGNOSIS — Z515 Encounter for palliative care: Secondary | ICD-10-CM

## 2020-04-12 DIAGNOSIS — N184 Chronic kidney disease, stage 4 (severe): Secondary | ICD-10-CM | POA: Diagnosis not present

## 2020-04-12 DIAGNOSIS — Z7189 Other specified counseling: Secondary | ICD-10-CM

## 2020-04-12 LAB — CBC
HCT: 32.2 % — ABNORMAL LOW (ref 36.0–46.0)
Hemoglobin: 10.9 g/dL — ABNORMAL LOW (ref 12.0–15.0)
MCH: 32.4 pg (ref 26.0–34.0)
MCHC: 33.9 g/dL (ref 30.0–36.0)
MCV: 95.8 fL (ref 80.0–100.0)
Platelets: 174 10*3/uL (ref 150–400)
RBC: 3.36 MIL/uL — ABNORMAL LOW (ref 3.87–5.11)
RDW: 15.3 % (ref 11.5–15.5)
WBC: 8.1 10*3/uL (ref 4.0–10.5)
nRBC: 0 % (ref 0.0–0.2)

## 2020-04-12 LAB — BASIC METABOLIC PANEL
Anion gap: 9 (ref 5–15)
BUN: 23 mg/dL (ref 8–23)
CO2: 21 mmol/L — ABNORMAL LOW (ref 22–32)
Calcium: 8.8 mg/dL — ABNORMAL LOW (ref 8.9–10.3)
Chloride: 109 mmol/L (ref 98–111)
Creatinine, Ser: 1.67 mg/dL — ABNORMAL HIGH (ref 0.44–1.00)
GFR calc Af Amer: 32 mL/min — ABNORMAL LOW (ref >60)
GFR calc non Af Amer: 27 mL/min — ABNORMAL LOW (ref >60)
Glucose, Bld: 216 mg/dL — ABNORMAL HIGH (ref 70–99)
Potassium: 3 mmol/L — ABNORMAL LOW (ref 3.5–5.1)
Sodium: 139 mmol/L (ref 135–145)

## 2020-04-12 LAB — C-REACTIVE PROTEIN: CRP: 17.7 mg/dL — ABNORMAL HIGH (ref <1.0)

## 2020-04-12 LAB — URIC ACID: Uric Acid, Serum: 7.2 mg/dL — ABNORMAL HIGH (ref 2.5–7.1)

## 2020-04-12 LAB — PROCALCITONIN: Procalcitonin: 2.25 ng/mL

## 2020-04-12 LAB — SEDIMENTATION RATE: Sed Rate: 105 mm/hr — ABNORMAL HIGH (ref 0–22)

## 2020-04-12 MED ORDER — MIRTAZAPINE 15 MG PO TABS
7.5000 mg | ORAL_TABLET | Freq: Every day | ORAL | Status: DC
  Administered 2020-04-12: 7.5 mg via ORAL
  Filled 2020-04-12 (×2): qty 1

## 2020-04-12 MED ORDER — POTASSIUM CHLORIDE CRYS ER 20 MEQ PO TBCR
40.0000 meq | EXTENDED_RELEASE_TABLET | Freq: Once | ORAL | Status: AC
  Administered 2020-04-12: 40 meq via ORAL
  Filled 2020-04-12: qty 2

## 2020-04-12 NOTE — Social Work (Signed)
CSW acknowledging consult for SNF placement. Will follow for therapy recommendations needed to best determine disposition/for insurance authorization.   Meah Jiron, MSW, LCSW [place] Clinical Social Work    

## 2020-04-12 NOTE — Care Management Obs Status (Signed)
[place] NOTIFICATION   Patient Details  Name: DAVI ROTAN MRN: [id number] Date of Birth: 1932/04/09   Medicare Observation Status Notification Given:  Yes    Marilu Favre, RN 04/12/2020, 4:44 PM

## 2020-04-12 NOTE — Progress Notes (Signed)
PROGRESS NOTE    Patricia [name]  ZYY:[id number] DOB: 10-31-1931 DOA: 04/11/2020 PCP: No primary care provider on file.  Brief Narrative: Patricia Fairbank Hicksis a 86 y.o.femalewith medical history significant ofkidney transplant (2009); CKD 3-4,  pre-DM; hypothyroidism; HTN; CAD; COPD, chronic debility, failure to thrive presented with weakness, body aches, arm pain. - [place] at the beach last [place] and she started feeling weak/tired.  They took her to the ER and they determined she had a problem in her stomach and they gave her NOS meds for that issue.  She took it about 2-3 days without obvious improvement.  Since then, she has been in bed.  She screams out in paid if you touch her arms.  They hurt from the B elbow to the hands.  She wasn't active at the beach, stayed in the room other than going out for meals. She has been in a wheelchair for years. She was in [facility] for 16 days after her last hospitalization and he would prefer that she return there.  When EMS arrived at home, patient was reportedly febrile to 102. ED Course: Generally weak.    Afebrile in the ED, labs largely unremarkable  Assessment & Plan:   Generalized weakness Fever, bilateral wrist and elbow pain Longstanding debility and failure to thrive -CT C-spine noted degenerative disease -She does have some synovitis especially at both wrists and slightly the right elbow, I wonder if she could be having a subacute gout flare, will check ESR, uric acid level -Febrile per EMS yesterday but afebrile since then, no clear source of infection at this time check procalcitonin and CRP -Follow-up blood cultures -COVID-19 PCR is negative -PT OT eval, may need rehab, palliative medicine was also consulted given ongoing failure to thrive  Stage IV chronic kidney disease Prior renal transplant -Kidney function is stable, continue home regimen of Imuran and cyclosporine -Continue Bactrim for prophylaxis  Hypertension -Blood  pressure in the low 100s, discontinue clonidine and monitor  Chronic diastolic CHF -Echo in 4888 with grade 2 diastolic dysfunction, preserved EF -Euvolemic at this time, stop IV fluids  History of DVT -Likely chronic, noted on duplex ultrasound in 2014 in 2018 -On Xarelto at baseline, this was changed to Eliquis given CKD  Hypothyroidism -Recent TSH was normal, continue home dose of Synthroid  Adult failure to thrive Severe protein calorie malnutrition Very poor functional status -She is DNR, palliative medicine was consulted on admission yesterday  DVT prophylaxis: Eliquis Code Status: DNR Family Communication: Discussed with patient in detail, no family at bedside disposition Plan:  Status is: Observation Dispo: The patient is from: Home              Anticipated d/c is to: SNF              Anticipated d/c date is: 2 days              Patient currently is not medically stable to d/c.  Consultants:   Palliative medicine   Procedures:   Antimicrobials:    Subjective: -Complaints of weakness, pain in both arms, wrists and elbows  Objective: Vitals:   04/11/20 2027 04/11/20 2103 04/12/20 0031 04/12/20 0512  BP: (!) 197/103 (!) 171/71 (!) 117/57 129/75  Pulse: 84 88 83 79  Resp: _0 Temp: 99.2 F (37.3 C) 98.2 F (36.8 C) (!) 97.2 F (36.2 C) (!) 97.5 F (36.4 C)  TempSrc: Oral Oral Axillary Oral  SpO2: 98% 98%  98% 97%  Weight:  51.3 kg    Height:  5' 5" (1.651 m)      Intake/Output Summary (Last 24 hours) at 04/12/2020 1009 Last data filed at 04/12/2020 1610 Gross per 24 hour  Intake 1803.71 ml  Output 150 ml  Net 1653.71 ml   Filed Weights   04/11/20 2103  Weight: 51.3 kg    Examination:  General exam: Chronically ill elderly frail female sitting up in bed, awake alert oriented to self, partly to place, cognitive deficits noted  respiratory system: Decreased breath sounds to bases, otherwise clear Cardiovascular system: S1 & S2 heard,  RRR.   Gastrointestinal system: Abdomen is nondistended, soft and nontender.Normal bowel sounds heard. Central nervous system: Awake alert, oriented to self and place only, moves all extremities Extremities: No edema, ecchymosis both arms, severe pain and tenderness with any mobility of both wrists and right elbow Skin: Bruises and ecchymosis bilaterally Psychiatry: Poor insight and judgment    Data Reviewed:   CBC: Recent Labs  Lab 04/11/20 1033 04/12/20 0255  WBC 10.7* 8.1  NEUTROABS 7.8*  --   HGB 12.2 10.9*  HCT 37.1 32.2*  MCV 97.6 95.8  PLT 224 960   Basic Metabolic Panel: Recent Labs  Lab 04/11/20 1033 04/12/20 0255  NA 141 139  K 3.0* 3.0*  CL 107 109  CO2 19* 21*  GLUCOSE 165* 216*  BUN 17 23  CREATININE 1.83* 1.67*  CALCIUM 9.1 8.8*   GFR: Estimated Creatinine Clearance: 19.6 mL/min (A) (by C-G formula based on SCr of 1.67 mg/dL (H)). Liver Function Tests: Recent Labs  Lab 04/11/20 1033  AST 20  ALT 9  ALKPHOS 67  BILITOT 1.2  PROT 6.4*  ALBUMIN 2.8*   No results for input(s): LIPASE, AMYLASE in the last 168 hours. No results for input(s): AMMONIA in the last 168 hours. Coagulation Profile: No results for input(s): INR, PROTIME in the last 168 hours. Cardiac Enzymes: Recent Labs  Lab 04/11/20 1130  CKTOTAL 38   BNP (last 3 results) No results for input(s): PROBNP in the last 8760 hours. HbA1C: No results for input(s): HGBA1C in the last 72 hours. CBG: No results for input(s): GLUCAP in the last 168 hours. Lipid Profile: No results for input(s): CHOL, HDL, LDLCALC, TRIG, CHOLHDL, LDLDIRECT in the last 72 hours. Thyroid Function Tests: No results for input(s): TSH, T4TOTAL, FREET4, T3FREE, THYROIDAB in the last 72 hours. Anemia Panel: No results for input(s): VITAMINB12, FOLATE, FERRITIN, TIBC, IRON, RETICCTPCT in the last 72 hours. Urine analysis:    Component Value Date/Time   COLORURINE YELLOW 04/11/2020 1811   APPEARANCEUR CLEAR  04/11/2020 1811   LABSPEC 1.025 04/11/2020 1811   PHURINE 5.5 04/11/2020 1811   GLUCOSEU NEGATIVE 04/11/2020 1811   HGBUR MODERATE (A) 04/11/2020 1811   BILIRUBINUR NEGATIVE 04/11/2020 1811   KETONESUR NEGATIVE 04/11/2020 1811   PROTEINUR >300 (A) 04/11/2020 1811   UROBILINOGEN 0.2 08/20/2015 1310   NITRITE NEGATIVE 04/11/2020 1811   LEUKOCYTESUR TRACE (A) 04/11/2020 1811   Sepsis Labs: _0 (procalcitonin:4,lacticidven:4)  ) Recent Results (from the past 240 hour(s))  Culture, blood (routine x 2)     Status: None (Preliminary result)   Collection Time: 04/11/20 11:31 AM   Specimen: BLOOD RIGHT FOREARM  Result Value Ref Range Status   Specimen Description BLOOD RIGHT FOREARM  Final   Special Requests   Final    BOTTLES DRAWN AEROBIC AND ANAEROBIC Blood Culture adequate volume   Culture   Final  NO GROWTH < 24 HOURS Performed at [facility] [address] [place], [place] [address]    Report Status PENDING  Incomplete  Culture, blood (routine x 2)     Status: None (Preliminary result)   Collection Time: 04/11/20 11:34 AM   Specimen: BLOOD  Result Value Ref Range Status   Specimen Description BLOOD LEFT ANTECUBITAL  Final   Special Requests   Final    BOTTLES DRAWN AEROBIC AND ANAEROBIC Blood Culture adequate volume   Culture   Final    NO GROWTH < 24 HOURS Performed at [facility], [place] [address] [place], [place] [address]    Report Status PENDING  Incomplete  SARS Coronavirus 2 by RT PCR (hospital order, performed in [facility] hospital lab) Nasopharyngeal Nasopharyngeal Swab     Status: None   Collection Time: 04/11/20  5:11 PM   Specimen: Nasopharyngeal Swab  Result Value Ref Range Status   SARS Coronavirus 2 NEGATIVE NEGATIVE Final    Comment: (NOTE) SARS-CoV-2 target nucleic acids are NOT DETECTED.  The SARS-CoV-2 RNA is generally detectable in upper and lower respiratory specimens during the acute phase of infection. The  lowest concentration of SARS-CoV-2 viral copies this assay can detect is 250 copies / mL. A negative result does not preclude SARS-CoV-2 infection and should not be used as the sole basis for treatment or other patient management decisions.  A negative result may occur with improper specimen collection / handling, submission of specimen other than nasopharyngeal swab, presence of viral mutation(s) within the areas targeted by this assay, and inadequate number of viral copies (<250 copies / mL). A negative result must be combined with clinical observations, patient history, and epidemiological information.  Fact Sheet for Patients:   StrictlyIdeas.no  Fact Sheet for Healthcare Providers: BankingDealers.co.za  This test is not yet approved or  cleared by the [place] FDA and has been authorized for detection and/or diagnosis of SARS-CoV-2 by FDA under an Emergency Use Authorization (EUA).  This EUA will remain in effect (meaning this test can be used) for the duration of the COVID-19 declaration under Section 564(b)(1) of the Act, 21 U.S.C. section 360bbb-3(b)(1), unless the authorization is terminated or revoked sooner.  Performed at [facility], [place] [address], [place], [place] [address]          Radiology Studies: DG Chest 2 View  Result Date: 04/11/2020 CLINICAL DATA:  Fever, chills and body aches. EXAM: CHEST - 2 VIEW COMPARISON:  12/30/2019 FINDINGS: The cardiac silhouette, mediastinal and hilar contours are within normal limits and stable. Stable mild tortuosity and calcification of the thoracic aorta. Stable calcified granuloma in the left upper lobe. No acute pulmonary findings. No worrisome pulmonary lesions. The bony thorax is intact. Remote healed rib fractures are noted. IMPRESSION: No acute cardiopulmonary findings. Electronically Signed   By: Marijo Sanes M.D.   On: 04/11/2020 10:58   CT CERVICAL SPINE WO  CONTRAST  Result Date: 04/11/2020 CLINICAL DATA:  Cervical osteoarthritis EXAM: CT CERVICAL SPINE WITHOUT CONTRAST TECHNIQUE: Multidetector CT imaging of the cervical spine was performed without intravenous contrast. Multiplanar CT image reconstructions were also generated. COMPARISON:  MRI 03/11/2013 FINDINGS: Alignment: No subluxation. Skull base and vertebrae: No acute fracture. No primary bone lesion or focal pathologic process. Prior left occipital craniectomy. Soft tissues and spinal canal: No prevertebral fluid or swelling. No visible canal hematoma. Disc levels: Diffuse degenerative disc disease with disc space narrowing and spurring, most pronounced at  C5-6. Diffuse bilateral degenerative facet disease, moderate to advanced. Upper chest: Biapical scarring.  No acute findings. Other: Carotid artery calcifications. IMPRESSION: Diffuse mild degenerative disc disease. Diffuse moderate to advanced degenerative facet disease. No acute bony abnormality. Electronically Signed   By: Rolm Baptise M.D.   On: 04/11/2020 18:04        Scheduled Meds: . apixaban  2.5 mg Oral BID  . azaTHIOprine  50 mg Oral BID  . cloNIDine  0.1 mg Oral BID  . cycloSPORINE modified  100 mg Oral BID  . docusate sodium  100 mg Oral BID  . famotidine  20 mg Oral Q supper  . FLUoxetine  20 mg Oral Daily  . levothyroxine  75 mcg Oral Q0600  . loratadine  10 mg Oral Daily  . metoCLOPramide  5 mg Oral QID  . mirabegron ER  50 mg Oral QPM  . mometasone-formoterol  2 puff Inhalation BID  . oxybutynin  10 mg Oral Daily  . pantoprazole  40 mg Oral Daily  . pravastatin  40 mg Oral Daily  . predniSONE  5 mg Oral Q breakfast  . sulfamethoxazole-trimethoprim  1 tablet Oral Q M,W,F   Continuous Infusions: . lactated ringers 50 mL/hr at 04/12/20 0730  . methocarbamol (ROBAXIN) IV       LOS: 0 days    Time spent: 38mn  PDomenic Polite MD [facility]  04/12/2020, 10:09 AM

## 2020-04-12 NOTE — Progress Notes (Signed)
[facility] paged bladder scan 396mls. Arthor Captain LPN

## 2020-04-12 NOTE — TOC Progression Note (Signed)
Transition of Care [facility]) - Progression Note    Patient Details  Name: Patricia [name] MRN: [id number] Date of Birth: 1932-08-02  Transition of Care [facility]) CM/SW [place], [place] Phone Number: 04/12/2020, 3:50 PM  Clinical Narrative:    Prior auth received for pt, approved 6/30-7/2 for [facility]. Ref #[id number].   [place] admissions liaison aware, following for medical stability, aware of ongoing medical work up including palliative care consult.   Expected Discharge Plan: [place] Barriers to Discharge: Continued Medical Work up  Expected Discharge Plan and Services Expected Discharge Plan: [place] In-house Referral: Clinical Social Work Discharge Planning Services: CM Consult Post Acute Care Choice: [place] Living arrangements for the past 2 months: [place], [place]    Readmission Risk Interventions No flowsheet data found.

## 2020-04-12 NOTE — Consult Note (Signed)
Consultation Note Date: 04/12/2020   Patient Name: Patricia [name]  DOB: Jul 19, 1932  MRN: [id number]  Age / Sex: 86 y.o., female  PCP: [name], MD Referring Physician: Domenic Polite, MD  Reason for Consultation: Establishing goals of care  HPI/Patient Profile: 86 y.o. female  with past medical history of CKD 3-4 status post kidney transplant (2009), CAD, COPD, HTN, hypothyroidism, chronic debility, and failure to thrive admitted on 04/11/2020 with weakness, body aches, and arm pain. She developed increased fatigue and weakness while [place] the beach last [place], family took her to the ED and it was determined she had a problem in her stomach and gave her NOS meds. She took this 2-3 days without obvious improvement. She was unable to get out of bed and would scream out in pain if her arms were touched. Per EMS, patient had a fever of 102 on their arrival to the home, but has been febrile since with no obvious source of infection.  Palliative Medicine has been consulted to assist with [place] discussion in the setting of chronic debility and failure to thrive.   Clinical Assessment and Goals of Care: I have reviewed medical records including EPIC notes, labs and imaging, examined the patient and met at the bedside to discuss diagnosis, prognosis, GOC, EOL wishes, disposition, and options. No family currently at bedside. Patient is currently OOB to chair, alert, denies pain. Meal tray is on the bedside table mostly untouched.   I introduced Palliative Medicine as specialized medical care for people living with serious illness. It focuses on providing relief from the symptoms and stress of a serious illness.   We discussed a brief life review of the patient. She states she is originally from [place], [place]. 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 She has been married to her husband [name] for 1 years,  they have 2 sons together. She and [name] did ballroom dancing together as a hobby for many years. She practices the [employer]. She currently lives with [name] and her oldest son.  As far as functional and nutritional status, patient has been in a wheelchair for years. She was on dialysis for 5 years prior to receiving the kidney transplant. She describes a decline in her overall functional and nutritional status in the past 5 weeks, specifically not eating and spending more time in bed. She reports decreased appetite as well as early satiety. She also reports issues with chewing because her upper dentures are missing and it is very expensive to replace them.   We discussed her current illness and what it means in the larger context of her ongoing co-morbidities. We discussed that failure to thrive is a terminal diagnosis. She seems to understand, but expresses the desire to improve her nutritional status. We discuss an appetite stimulant, a softer diet, and a nutritional supplement--she is open to all of these recommendations.   Advanced directives, concepts specific to code status, artifical feeding and hydration, and rehospitalization were considered and discussed. She would not want artificial  feeding. I completed a MOST form with the patient but she was unable to sign it due to tremors.   Hospice and Palliative Care services outpatient were explained and offered. Discussed that she would be eligible for hospice now. Patient is not opposed to hospice at some point in the future, but would like to try rehab first to work on improving her nutritional and functional status.   Questions and concerns were addressed.  The patient was encouraged to call with questions or concerns.   Primary decision maker: Patient with support from spouse and son    SUMMARY OF RECOMMENDATIONS   - DNR/DNI - Discuss MOST form with husband and have him sign tomorrow (patient unable to sign due to tremors) - start  mirtazapine 7.5 mg nightly for appetite stimulant - modify diet to encourage mechanical soft foods - dietician consult to order nutritional supplements  Code Status/Advance Care Planning:  DNR  Palliative Prophylaxis:   Frequent Pain Assessment, Oral Care and Turn Reposition  Scope of treatment (per MOST form completed 04/12/20):   Cardiopulmonary Resuscitation: Do Not Attempt Resuscitation (DNR/No CPR)  Medical Interventions: Limited Additional Interventions: Use medical treatment, IV fluids and cardiac monitoring as indicated. DO NOT USE intubation or mechanical ventilation. May consider use of less invasive airway support such as BiPAP or CPAP. Also provide comfort measures. Transfer to the hospital if indicated. Avoid intensive care.   Antibiotics: - Determine use or limitation of antibiotics when infection occurs  IV Fluids: - IV fluids for a defined trial period (patient states 1 week)  Feeding Tube: - No feeding tube      Psycho-social/Spiritual:   Desire for further Chaplaincy support:no  Prognosis:   Unable to determine  Discharge Planning: To Be Determined      Primary Diagnoses: Present on Admission: . Bilateral arm pain . Chronic kidney disease, stage 4, severely decreased GFR (HCC) . Chronic diastolic CHF (congestive heart failure) ([place]) . COPD (chronic obstructive pulmonary disease) ([place]) . Essential hypertension . Hyperlipidemia . Hypothyroidism   I have reviewed the medical record, interviewed the patient and family, and examined the patient. The following aspects are pertinent.  Past Medical History:  Diagnosis Date  . Abnormal gait   . Anxiety   . Arthritis   . Asthma   . COPD, mild ([place])   . Coronary artery disease    pt denies  . Depression   . Diverticulitis   . Dizziness   . DVT (deep venous thrombosis) ([place])   . GERD (gastroesophageal reflux disease)   . Hypertension   . Hypothyroidism   . Multiple allergies   . Pneumonia   .  Pre-diabetes   . Reflux   . Renal disorder    had kidney transplant in 2009   Social History   Socioeconomic History  . Marital status: Married    Spouse name: [name]  . Number of children: 2  . Years of education: college  . Highest education level: Not on file  Occupational History  . Occupation: retired  Tobacco Use  . Smoking status: Never Smoker  . Smokeless tobacco: Never Used  Vaping Use  . Vaping Use: Never used  Substance and Sexual Activity  . Alcohol use: No  . Drug use: No  . Sexual activity: Not on file  Other Topics Concern  . Not on file  Social History Narrative   Patient lives at home with her husband [name]). Patient is retired. Patient has two children. Patient has college education.  Right handed.   Caffeine- None- Tea during day one cup    Family History  Problem Relation Age of Onset  . Alzheimer's disease Mother   . High Cholesterol Mother   . Alcoholism Father    Scheduled Meds: . apixaban  2.5 mg Oral BID  . azaTHIOprine  50 mg Oral BID  . cycloSPORINE modified  100 mg Oral BID  . docusate sodium  100 mg Oral BID  . famotidine  20 mg Oral Q supper  . FLUoxetine  20 mg Oral Daily  . levothyroxine  75 mcg Oral Q0600  . loratadine  10 mg Oral Daily  . metoCLOPramide  5 mg Oral QID  . mirabegron ER  50 mg Oral QPM  . mometasone-formoterol  2 puff Inhalation BID  . oxybutynin  10 mg Oral Daily  . pantoprazole  40 mg Oral Daily  . pravastatin  40 mg Oral Daily  . predniSONE  5 mg Oral Q breakfast  . sulfamethoxazole-trimethoprim  1 tablet Oral Q M,W,F   Continuous Infusions: . methocarbamol (ROBAXIN) IV     PRN Meds:.acetaminophen **OR** acetaminophen, albuterol, bisacodyl, hydrALAZINE, methocarbamol (ROBAXIN) IV, ondansetron **OR** ondansetron (ZOFRAN) IV, polyethylene glycol  Medications Prior to Admission:  Prior to Admission medications   Medication Sig Start Date End Date Taking? Authorizing Provider  acetaminophen (TYLENOL)  325 MG tablet Take 650 mg by mouth every 6 (six) hours as needed.   Yes [provider]  albuterol (VENTOLIN HFA) 108 (90 Base) MCG/ACT inhaler Inhale 2 puffs into the lungs every 6 (six) hours as needed for wheezing or shortness of breath. 01/21/20  Yes Lassen, Arlo C, PA-C  azaTHIOprine (IMURAN) 50 MG tablet Take 1 tablet (50 mg total) by mouth 2 (two) times daily. 01/21/20  Yes Lassen, Arlo C, PA-C  b complex vitamins tablet Take 1 tablet by mouth daily.   Yes [provider]  budesonide-formoterol (SYMBICORT) 160-4.5 MCG/ACT inhaler Inhale 2 puffs into the lungs in the morning and at bedtime. 01/21/20  Yes Lassen, Arlo C, PA-C  cetirizine (ZYRTEC) 10 MG tablet Take 1 tablet (10 mg total) by mouth daily. 01/21/20  Yes Lassen, Arlo C, PA-C  Cholecalciferol (VITAMIN D) 2000 UNITS tablet Take 2,000 Units by mouth every evening.    Yes [provider]  cloNIDine (CATAPRES) 0.1 MG tablet Take 1 tablet (0.1 mg total) by mouth 2 (two) times daily. 01/21/20  Yes Lassen, Arlo C, PA-C  cycloSPORINE modified (NEORAL) 100 MG capsule Take 1 capsule (100 mg total) by mouth 2 (two) times daily. 01/21/20  Yes Lassen, Arlo C, PA-C  DIPHENHYDRAMINE HCL, TOPICAL, (BENADRYL ITCH STOPPING) 2 % GEL Apply 1 application topically daily as needed for itch relief 01/21/20  Yes Oscar La, Arlo C, PA-C  famotidine (PEPCID) 20 MG tablet Take 20 mg by mouth daily with supper.   Yes [provider]  ferrous sulfate 325 (65 FE) MG tablet Take 325 mg by mouth daily with breakfast.   Yes [provider]  FLUoxetine (PROZAC) 20 MG tablet Take 1 tablet (20 mg total) by mouth daily. 01/21/20  Yes Lassen, Arlo C, PA-C  fluticasone (FLONASE) 50 MCG/ACT nasal spray Place 1 spray into both nostrils daily as needed for allergies or rhinitis.   Yes [provider]  Glucerna (GLUCERNA) LIQD Take 237 mLs by mouth 2 (two) times daily.   Yes [provider]  levothyroxine (SYNTHROID) 75 MCG tablet  Take 1 tablet (75 mcg total) by mouth daily. 01/21/20  Yes  Lassen, Arlo C, PA-C  Lutein-Zeaxanthin 25-5 MG CAPS Take 1 capsule by mouth daily.   Yes [provider]  metoCLOPramide (REGLAN) 5 MG tablet Take 5 mg by mouth 4 (four) times daily. 04/07/20  Yes [provider]  mirabegron ER (MYRBETRIQ) 50 MG TB24 tablet Take 1 tablet (50 mg total) by mouth every evening. 01/21/20  Yes Lassen, Arlo C, PA-C  omeprazole (PRILOSEC) 20 MG capsule Take 20 mg by mouth daily.   Yes [provider]  ondansetron (ZOFRAN) 4 MG tablet Take 4 mg by mouth every 4 (four) hours as needed for nausea/vomiting. 04/07/20  Yes [provider]  oxybutynin (DITROPAN-XL) 10 MG 24 hr tablet Take 1 tablet (10 mg total) by mouth daily. 01/21/20  Yes Lassen, Arlo C, PA-C  phenylephrine-shark liver oil-mineral oil-petrolatum (PREPARATION H) 0.25-3-14-71.9 % rectal ointment Place 1 application rectally 2 (two) times daily as needed for hemorrhoids.   Yes [provider]  pravastatin (PRAVACHOL) 40 MG tablet Take 40 mg by mouth daily.   Yes [provider]  predniSONE (DELTASONE) 5 MG tablet Take 1 tablet (5 mg total) by mouth daily with breakfast. 01/21/20  Yes Edmon Crape C, PA-C  sulfamethoxazole-trimethoprim (BACTRIM) 400-80 MG tablet Take 1 tablet by mouth every Monday, Wednesday, and Friday. 01/22/20  Yes Lassen, Arlo C, PA-C  XARELTO 10 MG TABS tablet Take 1 tablet (10 mg total) by mouth every evening. 01/21/20  Yes Trudie Reed, Arlo C, PA-C   Allergies  Allergen Reactions  . Macrolides And Ketolides Nausea Only  . Aricept [Donepezil Hcl] Nausea And Vomiting  . Codeine Nausea And Vomiting  . Morphine Nausea And Vomiting  . Penicillin G Nausea Only  . Penicillins Nausea And Vomiting and Other (See Comments)    HEADACHE Has patient had a PCN reaction causing immediate rash, facial/tongue/throat swelling, SOB or lightheadedness with hypotension: unkn Has patient had a PCN reaction causing  severe rash involving mucus membranes or skin necrosis: unkn Has patient had a PCN reaction that required hospitalization: unkn Has patient had a PCN reaction occurring within the last 10 years: unkn If all of the above answers are "NO", then may proceed with Cephalosporin use.   . Statins Other (See Comments)    Doesn't remember  . Streptomycin Other (See Comments)    headache   . Tetracycline Nausea Only  . Tetracyclines & Related Rash   Review of Systems  Constitutional: Positive for appetite change.  Neurological: Positive for weakness.    Physical Exam Vitals reviewed.  Constitutional:      General: She is not in acute distress.    Comments: Chronically ill-appearing  HENT:     Head: Normocephalic and atraumatic.  Pulmonary:     Effort: Pulmonary effort is normal.  Neurological:     Mental Status: She is alert and oriented to person, place, and time.     Vital Signs: BP 126/63 (BP Location: Right Arm)   Pulse 69   Temp 97.7 F (36.5 C) (Oral)   Resp 18   Ht 5\' 5"  (1.651 m)   Wt 51.3 kg   SpO2 96%   BMI 18.82 kg/m  Pain Scale: 0-10   Pain Score: 0-No pain   SpO2: SpO2: 96 % O2 Device:SpO2: 96 % O2 Flow Rate: .   IO: Intake/output summary:   Intake/Output Summary (Last 24 hours) at 04/12/2020 1553 Last data filed at 04/12/2020 1358 Gross per 24 hour  Intake 2215.71 ml  Output 150 ml  Net 2065.71  ml    LBM:   Baseline Weight: Weight: 51.3 kg Most recent weight: Weight: 51.3 kg      Palliative Assessment/Data: 40%    Time In: 15:00 Time Out: 16:10 Time Total: 70 minutes Greater than 50%  of this time was spent counseling and coordinating care related to the above assessment and plan.  Signed by: Lavena Bullion, NP   Please contact Palliative Medicine Team phone at [phone] for questions and concerns.  For individual provider: See Shea Evans

## 2020-04-12 NOTE — Evaluation (Signed)
Physical Therapy Evaluation Patient Details Name: Patricia [name] MRN: [id number] DOB: 02/09/1932 Today's Date: 04/12/2020   History of Present Illness  86 y.o. female s/p generalized weakness, stomach pain 04/11/20. Was taken to ED 2-3days prior to [facility] admission. Head CT scan and chest x-ray clear 04/11/20. Uses wheelchair at baseline, husband hopes to d/c to SNF. PMH kidney transplant (2009); pre-DM; hypothyroidism; HTN; CAD; and mild COPD.  Clinical Impression  Pt presents with an overall decrease in functional mobility, generalized weakness, and decreased balance secondary to above. PTA, pt at SNF per chart and uses w/c with assistance. Pt demonstrated impaired cognition today (A&Ox2) but was agreeable to and pleasant during session. Today, pt able to complete bed mobility max(A)+2 and transfer to chair total (A)+2 physical assistance. Discussed d/c to SNF level therapy, pt agreed. Pt would benefit from continued acute PT services to maximize functional mobility and independence prior to d/c to next venue of care.     Follow Up Recommendations SNF    Equipment Recommendations  Other (comment) (tbd)    Recommendations for Other Services       Precautions / Restrictions Precautions Precautions: Fall Precaution Comments: Uses W/C at baseline      Mobility  Bed Mobility Overal bed mobility: Needs Assistance Bed Mobility: Supine to Sit     Supine to sit: +2 for physical assistance;Max assist     General bed mobility comments: Pt required maxc assist to sit EOB, pt able to partially elevate trunk, but required assistance to control trunk and maintain upright position at EOB  Transfers Overall transfer level: Needs assistance   Transfers: Squat Pivot Transfers     Squat pivot transfers: +2 physical assistance;Total assist     General transfer comment: Pt required +2 total assistance to squat pivot to chair. Pt required verbal and tactile cues for education regarding the  movement and safe transfer.  Ambulation/Gait             General Gait Details: unable  Stairs            Wheelchair Mobility    Modified Rankin (Stroke Patients Only)       Balance Overall balance assessment: Needs assistance   Sitting balance-Leahy Scale: Poor Sitting balance - Comments: Pt required assistance to maintain sitting balance at EOB. Pt demonstrated R sided lean Postural control: Right lateral lean   Standing balance-Leahy Scale: Zero Standing balance comment: Pt required total(A) squat pivot +2 transfer to chair.                             Pertinent Vitals/Pain Pain Assessment: No/denies pain    Home Living Family/patient expects to be discharged to:: Skilled nursing facility                 Additional Comments: pt is poor historian, thought today was 04/18/20. Lives with husband and son 12 y.o. Pt unaware she was previously at [facility]. Per chart pt husband would like her to return to SNF.    Prior Function Level of Independence: Needs assistance   Gait / Transfers Assistance Needed: Uses w/c at baseline, pt reports she has RW walker. Has shower seat, rails in shower, raised toilet seat.           Hand Dominance   Dominant Hand: Right    Extremity/Trunk Assessment   Upper Extremity Assessment Upper Extremity Assessment: Defer to OT evaluation    Lower Extremity Assessment  Lower Extremity Assessment: Generalized weakness;RLE deficits/detail;LLE deficits/detail RLE Deficits / Details: Able to pump ankles and completed SLR halfway through motions. LLE Deficits / Details: Able to pump ankles and completed SLR halfway through motions.    Cervical / Trunk Assessment Cervical / Trunk Assessment: Kyphotic  Communication   Communication: HOH  Cognition Arousal/Alertness: Awake/alert Behavior During Therapy: Flat affect Overall Cognitive Status: No family/caregiver present to determine baseline cognitive functioning Area of  Impairment: Orientation;Attention;Memory;Following commands;Safety/judgement;Awareness;Problem solving                 Orientation Level: Disoriented to;Time;Situation Current Attention Level: Focused Memory: Decreased short-term memory Following Commands: Follows one step commands with increased time;Follows one step commands inconsistently Safety/Judgement: Decreased awareness of safety;Decreased awareness of deficits Awareness: Intellectual Problem Solving: Slow processing;Decreased initiation;Requires verbal cues;Requires tactile cues General Comments: Pt A&O x2. Pt required increased time,verbal and tactile cues for mobility. Pt reports she uses w/c and RW at home, with assistance for getting into W/C.      General Comments General comments (skin integrity, edema, etc.): Pt had several bruises on UE and LE. Pt reported no pain in bilateral UE during session. Reported husband would be by later today.    Exercises Total Joint Exercises Ankle Circles/Pumps: AROM;5 reps;Seated;Both Straight Leg Raises: AROM;Both;5 reps;Seated   Assessment/Plan    PT Assessment Patient needs continued PT services  PT Problem List Decreased strength;Decreased mobility;Decreased safety awareness;Decreased range of motion;Decreased coordination;Decreased knowledge of precautions;Decreased activity tolerance;Decreased cognition;Decreased balance;Decreased skin integrity;Decreased knowledge of use of DME       PT Treatment Interventions Therapeutic activities;DME instruction;Cognitive remediation;Patient/family education;Therapeutic exercise;Balance training;Wheelchair mobility training;Functional mobility training;Neuromuscular re-education    PT Goals (Current goals can be found in the Care Plan section)  Acute Rehab PT Goals Patient Stated Goal: To rehab PT Goal Formulation: With patient Time For Goal Achievement: 04/26/20 Potential to Achieve Goals: Good    Frequency Min 2X/week    Barriers to discharge   Per chart pt husband would like pt to return to SNF    Co-evaluation PT/OT/SLP Co-Evaluation/Treatment: Yes Reason for Co-Treatment: Complexity of the patient's impairments (multi-system involvement);For patient/therapist safety;To address functional/ADL transfers           AM-PAC PT "6 Clicks" Mobility  Outcome Measure Help needed turning from your back to your side while in a flat bed without using bedrails?: Total Help needed moving from lying on your back to sitting on the side of a flat bed without using bedrails?: Total Help needed moving to and from a bed to a chair (including a wheelchair)?: Total Help needed standing up from a chair using your arms (e.g., wheelchair or bedside chair)?: Total Help needed to walk in hospital room?: Total Help needed climbing 3-5 steps with a railing? : Total 6 Click Score: 6    End of Session Equipment Utilized During Treatment: Gait belt Activity Tolerance: Patient tolerated treatment well Patient left: in chair;with call bell/phone within reach;with chair alarm set Nurse Communication: Mobility status PT Visit Diagnosis: Unsteadiness on feet (R26.81);Other abnormalities of gait and mobility (R26.89);Muscle weakness (generalized) (M62.81)    Time: 7353-2992 PT Time Calculation (min) (ACUTE ONLY): 30 min   Charges:   PT Evaluation $PT Eval Moderate Complexity: 1 Mod          Hagen Bohorquez SPT 04/12/2020   Rolland Porter 04/12/2020, 10:26 AM

## 2020-04-12 NOTE — Evaluation (Signed)
Occupational Therapy Evaluation Patient Details Name: Patricia [name] MRN: [id number] DOB: 10-29-1931 Today's Date: 04/12/2020    History of Present Illness 86 y.o. female s/p generalized weakness, stomach pain 04/11/20. Was taken to ED 2-3days prior to [facility] admission. Head CT scan and chest x-ray clear 04/11/20. Uses wheelchair at baseline, husband hopes to d/c to SNF. PMH kidney transplant (2009); pre-DM; hypothyroidism; HTN; CAD; and mild COPD.   Clinical Impression   Pt PTA: pt from SNF and reports mostly in w/c for mobility; performing light grooming; otherwise requiring assist. Pt currrently limited by decreased ability to follow commands, decreased strength, decreased activity tolerance and decreased ability to care for self. Pt's RUE able to be used more than LUE.  Pain in LUE>RUE. Intention tremors in [place]. Pt minA overall for brushing teeth today seated in recliner. Pt maxA+2 for bed mobility; totalA+2 for squat pivot transfer. Pt would greatly benefit from continued OT skilled services. OT following acutely.     Follow Up Recommendations  SNF;Supervision/Assistance - 24 hour    Equipment Recommendations  Other (comment) (defer to next facility)    Recommendations for Other Services       Precautions / Restrictions Precautions Precautions: Fall Precaution Comments: Uses W/C at baseline Restrictions Weight Bearing Restrictions: No      Mobility Bed Mobility Overal bed mobility: Needs Assistance Bed Mobility: Supine to Sit     Supine to sit: Max assist;+2 for physical assistance     General bed mobility comments: Pt with multimodal cues, pt able to assist with trunk elevation and BLE movement.  Transfers Overall transfer level: Needs assistance Equipment used: 2 person hand held assist Transfers: Squat Pivot Transfers     Squat pivot transfers: Total assist;+2 physical assistance     General transfer comment: +2 totalA for squat pivot; pt unable to assist  or take any steps.    Balance Overall balance assessment: Needs assistance   Sitting balance-Leahy Scale: Poor Sitting balance - Comments: minA to minguardA for static sitting balance Postural control: Right lateral lean   Standing balance-Leahy Scale: Zero Standing balance comment: Pt required total(A) squat pivot +2 transfer to chair.                           ADL either performed or assessed with clinical judgement   ADL Overall ADL's : Needs assistance/impaired Eating/Feeding: Minimal assistance;Bed level;Sitting Eating/Feeding Details (indicate cue type and reason): supported sitting Grooming: Minimal assistance;Standing   Upper Body Bathing: Minimal assistance;Sitting;Bed level   Lower Body Bathing: Total assistance;Cueing for safety;Sitting/lateral leans;Sit to/from stand   Upper Body Dressing : Minimal assistance;Sitting;Bed level   Lower Body Dressing: Total assistance;Cueing for safety;Sit to/from stand;Sitting/lateral leans;Bed level   Toilet Transfer: Total assistance;+2 for physical assistance;+2 for safety/equipment;Squat-pivot   Toileting- Clothing Manipulation and Hygiene: Total assistance;+2 for physical assistance;+2 for safety/equipment;Sitting/lateral lean;Sit to/from stand;Cueing for safety       Functional mobility during ADLs: Maximal assistance;Total assistance;+2 for physical assistance;+2 for safety/equipment;Cueing for safety;Cueing for sequencing General ADL Comments: Pt limited by decreased ability to follow commands, decreased strength, decreased activity tolerance and decreased ability to care for self.     Vision Baseline Vision/History: No visual deficits Patient Visual Report: No change from baseline Vision Assessment?: No apparent visual deficits     Perception     Praxis      Pertinent Vitals/Pain Pain Assessment: Faces Faces Pain Scale: Hurts even more Pain Location: L arm Pain Descriptors /  Indicators:  Discomfort;Grimacing;Guarding Pain Intervention(s): Monitored during session;Premedicated before session;Repositioned     Hand Dominance Right   Extremity/Trunk Assessment Upper Extremity Assessment Upper Extremity Assessment: Generalized weakness;RUE deficits/detail;LUE deficits/detail RUE Deficits / Details: RUE movement shoulder to 90* and elbow through hand, ROM is WFLs. Intention tremors present. RUE Coordination: decreased fine motor;decreased gross motor LUE Deficits / Details: Limited mobility of LUE ROM at shoulder 30* FF, elbow through digits painful to move, tremors present. LUE Coordination: decreased fine motor;decreased gross motor   Lower Extremity Assessment Lower Extremity Assessment: Defer to PT evaluation;Generalized weakness RLE Deficits / Details: Able to pump ankles and completed SLR halfway through motions. LLE Deficits / Details: Able to pump ankles and completed SLR halfway through motions.   Cervical / Trunk Assessment Cervical / Trunk Assessment: Kyphotic   Communication Communication Communication: HOH   Cognition Arousal/Alertness: Awake/alert Behavior During Therapy: Flat affect Overall Cognitive Status: No family/caregiver present to determine baseline cognitive functioning Area of Impairment: Orientation;Attention;Memory;Following commands;Safety/judgement;Awareness;Problem solving                 Orientation Level: Disoriented to;Time;Situation Current Attention Level: Focused Memory: Decreased short-term memory Following Commands: Follows one step commands with increased time;Follows one step commands inconsistently Safety/Judgement: Decreased awareness of safety;Decreased awareness of deficits Awareness: Intellectual Problem Solving: Slow processing;Decreased initiation;Requires verbal cues;Requires tactile cues General Comments: Pt A/O x2 and pt requiring increased time for basic commands. Pt able to piece together some home info about  PLOF, but not all. Spouse not in room.   General Comments  Pt with bruising on BUEs and BLEs. Pt's spouse to come later today.    Exercises Total Joint Exercises Ankle Circles/Pumps: AROM;5 reps;Seated;Both Straight Leg Raises: AROM;Both;5 reps;Seated   Shoulder Instructions      Home Living Family/patient expects to be discharged to:: Skilled nursing facility                                 Additional Comments: pt is poor historian, thought today was 04/18/20. Lives with husband and son 29 y.o. Pt unaware she was previously at [facility]. Per chart pt husband would like her to return to SNF.      Prior Functioning/Environment Level of Independence: Needs assistance  Gait / Transfers Assistance Needed: Uses w/c at baseline, pt reports she has RW walker. Has shower seat, rails in shower, raised toilet seat. ADL's / Homemaking Assistance Needed: Pt expressing grooming with set-upA; otherwise, pt required assist for BADL and IADLs provided for her.            OT Problem List: Decreased strength;Decreased range of motion;Decreased activity tolerance;Impaired balance (sitting and/or standing);Decreased coordination;Decreased cognition;Decreased safety awareness;Decreased knowledge of use of DME or AE;Pain;Impaired UE functional use;Increased edema      OT Treatment/Interventions: Self-care/ADL training;Therapeutic exercise;Energy conservation;DME and/or AE instruction;Therapeutic activities;Patient/family education;Balance training;Cognitive remediation/compensation    OT Goals(Current goals can be found in the care plan section) Acute Rehab OT Goals Patient Stated Goal: To rehab OT Goal Formulation: Patient unable to participate in goal setting Time For Goal Achievement: 04/26/20 Potential to Achieve Goals: Fair ADL Goals Pt Will Perform Grooming: with min guard assist;sitting Pt Will Perform Upper Body Dressing: with supervision;sitting Pt Will Transfer to Toilet: with min  assist;stand pivot transfer;bedside commode Pt/caregiver will Perform Home Exercise Program: Increased strength;Both right and left upper extremity;With Supervision Additional ADL Goal #1: Pt will follow 1-2 step commands with minimal cues to attend  to task.  OT Frequency: Min 2X/week   Barriers to D/C: Decreased caregiver support          Co-evaluation PT/OT/SLP Co-Evaluation/Treatment: Yes Reason for Co-Treatment: Complexity of the patient's impairments (multi-system involvement);To address functional/ADL transfers;For patient/therapist safety   OT goals addressed during session: ADL's and self-care      AM-PAC OT "6 Clicks" Daily Activity     Outcome Measure Help from another person eating meals?: A Little Help from another person taking care of personal grooming?: A Little Help from another person toileting, which includes using toliet, bedpan, or urinal?: Total Help from another person bathing (including washing, rinsing, drying)?: Total Help from another person to put on and taking off regular upper body clothing?: A Little Help from another person to put on and taking off regular lower body clothing?: Total 6 Click Score: 12   End of Session Equipment Utilized During Treatment: Gait belt Nurse Communication: Mobility status  Activity Tolerance: Patient limited by pain;Patient tolerated treatment well Patient left: in chair;with call bell/phone within reach;with chair alarm set  OT Visit Diagnosis: Unsteadiness on feet (R26.81);Muscle weakness (generalized) (M62.81);Pain;Other symptoms and signs involving cognitive function Pain - Right/Left: Left Pain - part of body: Arm                Time: 9767-3419 OT Time Calculation (min): 23 min Charges:  OT General Charges $OT Visit: 1 Visit OT Evaluation $OT Eval Moderate Complexity: 1 Mod  Jefferey Pica, OTR/L Acute Rehabilitation Services Pager: [phone] Office: [phone]   Leeona Mccardle C 04/12/2020, 10:53 AM

## 2020-04-12 NOTE — NC FL2 (Signed)
[place] MEDICAID FL2 LEVEL OF CARE SCREENING TOOL     IDENTIFICATION  Patient Name: Patricia [name] Birthdate: 1932-01-20 Sex: female Admission Date (Current Location): 04/11/2020  [facility] and [place] Number:  Herbalist and Address:  [place], [place] [address] [place], [place] [address]      Provider Number: [id number]  Attending Physician Name and Address:  Domenic Polite, MD  Relative Name and Phone Number:       Current Level of Care: Hospital Recommended Level of Care: [place] Prior Approval Number:    Date Approved/Denied:   PASRR Number: [id number]  Discharge Plan: SNF    Current Diagnoses: Patient Active Problem List   Diagnosis Date Noted  . Bilateral arm pain 04/11/2020  . History of anemia due to CKD 12/31/2019  . Anemia in chronic kidney disease (CKD) 12/31/2019  . Generalized weakness 12/30/2019  . Cholecystitis with cholelithiasis 07/01/2018  . Postoperative anemia due to acute blood loss 02/15/2016  . Hyperlipidemia 02/15/2016  . Displaced fracture of right femoral neck ([place]) 02/10/2016  . Chronic diastolic CHF (congestive heart failure) ([employer]) 02/08/2016  . Closed right hip fracture ([place]) 02/08/2016  . Hip fracture ([place]) 02/08/2016  . Acute diastolic CHF (congestive heart failure) ([place]) 05/27/2015  . Urinary incontinence 05/27/2015  . Escherichia coli urinary tract infection   . Other emphysema ([place])   . Hypokalemia   . Hypomagnesemia   . Sepsis secondary to UTI ([place]) 05/22/2015  . DVT, lower extremity, proximal ([place]) 03/02/2014  . Chronic kidney disease, stage 4, severely decreased GFR (HCC) 03/02/2014  . Essential hypertension 03/02/2014  . DVT (deep venous thrombosis) ([place]) 03/01/2014  . Acute renal failure superimposed on stage 3 chronic kidney disease ([place]) 03/01/2014  . Gastroenteritis 11/22/2013  . Nausea & vomiting 11/22/2013  . Hypercalcemia 11/22/2013  . Infection due to  ESBL-producing Escherichia coli 10/07/2013  . Septicemia due to E. coli ([place]) 10/07/2013  . Urinary tract infection 10/05/2013  . Sepsis ([place]) 10/05/2013  . History of renal transplant   . Hx of kidney transplant   . Hypertension   . Thyroid disease   . Multiple allergies   . Abnormal gait   . Reflux   . Diverticulitis   . Hypothyroidism   . Depression   . Anxiety   . COPD (chronic obstructive pulmonary disease) ([place])   . Dizziness   . Diabetes type 2, controlled ([place])     Orientation RESPIRATION BLADDER Height & Weight     Self, Situation, Place  Normal Continent, External catheter Weight: 113 lb 1.5 oz (51.3 kg) Height:  5\' 5"  (165.1 cm)  BEHAVIORAL SYMPTOMS/MOOD NEUROLOGICAL BOWEL NUTRITION STATUS      Continent Diet (see discharge summary)  AMBULATORY STATUS COMMUNICATION OF NEEDS Skin   Extensive Assist Verbally Normal                       Personal Care Assistance Level of Assistance  Bathing, Feeding, Dressing Bathing Assistance: Maximum assistance Feeding assistance: Independent Dressing Assistance: Maximum assistance     Functional Limitations Info  Sight, Hearing, Speech Sight Info: Adequate Hearing Info: Adequate Speech Info: Adequate    SPECIAL CARE FACTORS FREQUENCY  PT (By licensed PT), OT (By licensed OT)     PT Frequency: 5x week OT Frequency: 5x week            Contractures Contractures Info: Not present    Additional Factors Info  Code  Status, Allergies, Psychotropic Code Status Info: DNR Allergies Info: Macrolides And Ketolides, Aricept (Donepezil Hcl), Codeine, Morphine, Penicillin G, Penicillins, Statins, Streptomycin, Tetracycline, Tetracyclines & Related Psychotropic Info: FLUoxetine (PROZAC) capsule 20 mg daily PO         Current Medications (04/12/2020):  This is the current hospital active medication list Current Facility-Administered Medications  Medication Dose Route Frequency Provider Last Rate Last Admin  .  acetaminophen (TYLENOL) tablet 650 mg  650 mg Oral Q6H PRN Karmen Bongo, MD       Or  . acetaminophen (TYLENOL) suppository 650 mg  650 mg Rectal Q6H PRN Karmen Bongo, MD      . albuterol (PROVENTIL) (2.5 MG/3ML) 0.083% nebulizer solution 2.5 mg  2.5 mg Inhalation Q6H PRN Karmen Bongo, MD      . apixaban [facility]) tablet 2.5 mg  2.5 mg Oral BID Duanne Limerick, RPH   2.5 mg at 04/12/20 0931  . azaTHIOprine (IMURAN) tablet 50 mg  50 mg Oral BID Karmen Bongo, MD   50 mg at 04/12/20 0931  . bisacodyl (DULCOLAX) EC tablet 5 mg  5 mg Oral Daily PRN Karmen Bongo, MD      . cloNIDine (CATAPRES) tablet 0.1 mg  0.1 mg Oral BID Karmen Bongo, MD   0.1 mg at 04/12/20 0931  . cycloSPORINE modified (NEORAL) capsule 100 mg  100 mg Oral BID Karmen Bongo, MD   100 mg at 04/12/20 0931  . docusate sodium (COLACE) capsule 100 mg  100 mg Oral BID Karmen Bongo, MD   100 mg at 04/12/20 0930  . famotidine (PEPCID) tablet 20 mg  20 mg Oral Q supper Karmen Bongo, MD   20 mg at 04/11/20 1945  . FLUoxetine (PROZAC) capsule 20 mg  20 mg Oral Daily Karmen Bongo, MD   20 mg at 04/12/20 0930  . hydrALAZINE (APRESOLINE) injection 5 mg  5 mg Intravenous Q4H PRN Karmen Bongo, MD   5 mg at 04/11/20 2042  . lactated ringers infusion   Intravenous Continuous Karmen Bongo, MD 50 mL/hr at 04/12/20 0730 Other (enter comment in med admin window) at 04/12/20 0730  . levothyroxine (SYNTHROID) tablet 75 mcg  75 mcg Oral Q0600 Karmen Bongo, MD   75 mcg at 04/12/20 0605  . loratadine (CLARITIN) tablet 10 mg  10 mg Oral Daily Karmen Bongo, MD   10 mg at 04/12/20 0930  . methocarbamol (ROBAXIN) 500 mg in dextrose 5 % 50 mL IVPB  500 mg Intravenous Q6H PRN Karmen Bongo, MD      . metoCLOPramide (REGLAN) tablet 5 mg  5 mg Oral QID Karmen Bongo, MD   5 mg at 04/12/20 0931  . mirabegron ER (MYRBETRIQ) tablet 50 mg  50 mg Oral QPM Karmen Bongo, MD   50 mg at 04/11/20 1944  . mometasone-formoterol  (DULERA) 200-5 MCG/ACT inhaler 2 puff  2 puff Inhalation BID Karmen Bongo, MD   2 puff at 04/12/20 0750  . ondansetron (ZOFRAN) tablet 4 mg  4 mg Oral Q6H PRN Karmen Bongo, MD       Or  . ondansetron [facility]) injection 4 mg  4 mg Intravenous Q6H PRN Karmen Bongo, MD      . oxybutynin (DITROPAN-XL) 24 hr tablet 10 mg  10 mg Oral Daily Karmen Bongo, MD   10 mg at 04/12/20 0930  . pantoprazole (PROTONIX) EC tablet 40 mg  40 mg Oral Daily Karmen Bongo, MD   40 mg at 04/12/20 0931  . polyethylene glycol (MIRALAX /  GLYCOLAX) packet 17 g  17 g Oral Daily PRN Karmen Bongo, MD      . pravastatin (PRAVACHOL) tablet 40 mg  40 mg Oral Daily Karmen Bongo, MD   40 mg at 04/12/20 0930  . predniSONE (DELTASONE) tablet 5 mg  5 mg Oral Q breakfast Karmen Bongo, MD   5 mg at 04/12/20 0750  . sulfamethoxazole-trimethoprim (BACTRIM) 400-80 MG per tablet 1 tablet  1 tablet Oral Q M,W,F Karmen Bongo, MD   1 tablet at 04/11/20 2309     Discharge Medications: Please see discharge summary for a list of discharge medications.  Relevant Imaging Results:  Relevant Lab Results:   Additional Information SS[address][id number] [place], [place]

## 2020-04-12 NOTE — TOC Initial Note (Addendum)
Transition of Care [facility]) - Initial/Assessment Note    Patient Details  Name: Patricia [name] MRN: [id number] Date of Birth: [birth year]  Transition of Care [facility]) CM/SW Contact:    Alexander Mt, LCSW Phone Number: 04/12/2020, 3:09 PM  Clinical Narrative:                 CSW spoke with pt at bedside. Introduced self, role, reason for visit. Pt from home with her husband [name] and adult son [name]. She confirms home address and PCP as Dr. Felipa Eth. Pt has been using a rollator at home. She has had progressive feelings of weakness over the past few weeks. She is interested in SNF and referral was made to [facility] at her request.   CSW later met with her husband [name] and pt again at bedside. [facility] can accept pt, pt may soon be in copay days. Pt and pt spouse aware and amenable still for placement.   TOC team has initiated insurance auth ref #[id number]  Expected Discharge Plan: [place] Barriers to Discharge: Continued Medical Work up, Ship broker   Patient Goals and CMS Choice Patient states their goals for this hospitalization and ongoing recovery are:: return to [facility] and get stronger [employer].gov Compare Post Acute Care list provided to:: Patient (pt preferred choice [place]) Choice offered to / list presented to : Patient, Spouse  Expected Discharge Plan and Services Expected Discharge Plan: [place] In-house Referral: Clinical Social Work Discharge Planning Services: CM Consult Post Acute Care Choice: [place] Living arrangements for the past 2 months: [place], [place]  Prior Living Arrangements/Services Living arrangements for the past 2 months: [place], [place] Lives with:: Adult Children, Spouse Patient language and need for interpreter reviewed:: Yes (no needs) Do you feel safe going back to the place where you live?: Yes      Need for  Family Participation in Patient Care: Yes (Comment) (assistance with daily cares) Care giver support system in place?: Yes (comment) (spouse; adult children) Current home services: DME Criminal Activity/Legal Involvement Pertinent to Current Situation/Hospitalization: No - Comment as needed  Permission Sought/Granted Permission sought to share information with : Facility Sport and exercise psychologist, Family Supports Permission granted to share information with : Yes, Verbal Permission Granted  Share Information with NAME: Takyia Sindt  Permission granted to share info w AGENCY: [place] granted to share info w Relationship: spouse  Permission granted to share info w Contact Information: [phone]  Emotional Assessment Appearance:: Appears stated age Attitude/Demeanor/Rapport: Engaged Affect (typically observed): Accepting, Adaptable, Appropriate Orientation: : Oriented to Self, Oriented to Place, Oriented to Situation, Fluctuating Orientation (Suspected and/or reported [place]) Alcohol / Substance Use: Not Applicable Psych Involvement: Outpatient Provider  Admission diagnosis:  Dehydration [E86.0] Hypokalemia [E87.6] Generalized weakness [R53.1] Pain in both upper extremities [M79.601, M79.602] Patient Active Problem List   Diagnosis Date Noted  . Bilateral arm pain 04/11/2020  . History of anemia due to CKD 12/31/2019  . Anemia in chronic kidney disease (CKD) 12/31/2019  . Generalized weakness 12/30/2019  . Cholecystitis with cholelithiasis 07/01/2018  . Postoperative anemia due to acute blood loss 02/15/2016  . Hyperlipidemia 02/15/2016  . Displaced fracture of right femoral neck ([place]) 02/10/2016  . Chronic diastolic CHF (congestive heart failure) ([place]) 02/08/2016  . Closed right hip fracture ([place]) 02/08/2016  . Hip fracture ([place]) 02/08/2016  . Acute diastolic CHF (congestive heart failure) ([place]) 05/27/2015  . Urinary  incontinence 05/27/2015  . Escherichia coli  urinary tract infection   . Other emphysema ([place])   . Hypokalemia   . Hypomagnesemia   . Sepsis secondary to UTI ([place]) 05/22/2015  . DVT, lower extremity, proximal ([place]) 03/02/2014  . Chronic kidney disease, stage 4, severely decreased GFR (HCC) 03/02/2014  . Essential hypertension 03/02/2014  . DVT (deep venous thrombosis) ([place]) 03/01/2014  . Acute renal failure superimposed on stage 3 chronic kidney disease ([place]) 03/01/2014  . Gastroenteritis 11/22/2013  . Nausea & vomiting 11/22/2013  . Hypercalcemia 11/22/2013  . Infection due to ESBL-producing Escherichia coli 10/07/2013  . Septicemia due to E. coli ([place]) 10/07/2013  . Urinary tract infection 10/05/2013  . Sepsis ([place]) 10/05/2013  . History of renal transplant   . Hx of kidney transplant   . Hypertension   . Thyroid disease   . Multiple allergies   . Abnormal gait   . Reflux   . Diverticulitis   . Hypothyroidism   . Depression   . Anxiety   . COPD (chronic obstructive pulmonary disease) ([place])   . Dizziness   . Diabetes type 2, controlled ([place])    PCP:  [name], MD Pharmacy:   [facility] - [place], [place] - [address] [place] [address] Phone: [phone] Fax: [phone]  Readmission Risk Interventions No flowsheet data found.

## 2020-04-13 DIAGNOSIS — Z515 Encounter for palliative care: Secondary | ICD-10-CM | POA: Diagnosis not present

## 2020-04-13 DIAGNOSIS — R2681 Unsteadiness on feet: Secondary | ICD-10-CM | POA: Diagnosis not present

## 2020-04-13 DIAGNOSIS — J45909 Unspecified asthma, uncomplicated: Secondary | ICD-10-CM | POA: Diagnosis not present

## 2020-04-13 DIAGNOSIS — E1122 Type 2 diabetes mellitus with diabetic chronic kidney disease: Secondary | ICD-10-CM | POA: Diagnosis not present

## 2020-04-13 DIAGNOSIS — M2550 Pain in unspecified joint: Secondary | ICD-10-CM | POA: Diagnosis not present

## 2020-04-13 DIAGNOSIS — M353 Polymyalgia rheumatica: Secondary | ICD-10-CM | POA: Diagnosis not present

## 2020-04-13 DIAGNOSIS — Z7401 Bed confinement status: Secondary | ICD-10-CM | POA: Diagnosis not present

## 2020-04-13 DIAGNOSIS — E860 Dehydration: Secondary | ICD-10-CM

## 2020-04-13 DIAGNOSIS — M10032 Idiopathic gout, left wrist: Secondary | ICD-10-CM | POA: Diagnosis not present

## 2020-04-13 DIAGNOSIS — K219 Gastro-esophageal reflux disease without esophagitis: Secondary | ICD-10-CM | POA: Diagnosis not present

## 2020-04-13 DIAGNOSIS — I2510 Atherosclerotic heart disease of native coronary artery without angina pectoris: Secondary | ICD-10-CM | POA: Diagnosis not present

## 2020-04-13 DIAGNOSIS — J069 Acute upper respiratory infection, unspecified: Secondary | ICD-10-CM | POA: Diagnosis not present

## 2020-04-13 DIAGNOSIS — R634 Abnormal weight loss: Secondary | ICD-10-CM | POA: Diagnosis not present

## 2020-04-13 DIAGNOSIS — D519 Vitamin B12 deficiency anemia, unspecified: Secondary | ICD-10-CM | POA: Diagnosis not present

## 2020-04-13 DIAGNOSIS — R1314 Dysphagia, pharyngoesophageal phase: Secondary | ICD-10-CM | POA: Diagnosis not present

## 2020-04-13 DIAGNOSIS — M10021 Idiopathic gout, right elbow: Secondary | ICD-10-CM | POA: Diagnosis not present

## 2020-04-13 DIAGNOSIS — R4182 Altered mental status, unspecified: Secondary | ICD-10-CM | POA: Diagnosis not present

## 2020-04-13 DIAGNOSIS — Z7189 Other specified counseling: Secondary | ICD-10-CM | POA: Diagnosis not present

## 2020-04-13 DIAGNOSIS — J449 Chronic obstructive pulmonary disease, unspecified: Secondary | ICD-10-CM | POA: Diagnosis not present

## 2020-04-13 DIAGNOSIS — E43 Unspecified severe protein-calorie malnutrition: Secondary | ICD-10-CM | POA: Diagnosis not present

## 2020-04-13 DIAGNOSIS — I5032 Chronic diastolic (congestive) heart failure: Secondary | ICD-10-CM

## 2020-04-13 DIAGNOSIS — R627 Adult failure to thrive: Secondary | ICD-10-CM | POA: Diagnosis not present

## 2020-04-13 DIAGNOSIS — I69828 Other speech and language deficits following other cerebrovascular disease: Secondary | ICD-10-CM | POA: Diagnosis not present

## 2020-04-13 DIAGNOSIS — J42 Unspecified chronic bronchitis: Secondary | ICD-10-CM | POA: Diagnosis not present

## 2020-04-13 DIAGNOSIS — M1991 Primary osteoarthritis, unspecified site: Secondary | ICD-10-CM | POA: Diagnosis not present

## 2020-04-13 DIAGNOSIS — N184 Chronic kidney disease, stage 4 (severe): Secondary | ICD-10-CM

## 2020-04-13 DIAGNOSIS — J438 Other emphysema: Secondary | ICD-10-CM | POA: Diagnosis not present

## 2020-04-13 DIAGNOSIS — Z940 Kidney transplant status: Secondary | ICD-10-CM | POA: Diagnosis not present

## 2020-04-13 DIAGNOSIS — E034 Atrophy of thyroid (acquired): Secondary | ICD-10-CM | POA: Diagnosis not present

## 2020-04-13 DIAGNOSIS — M10031 Idiopathic gout, right wrist: Secondary | ICD-10-CM | POA: Diagnosis not present

## 2020-04-13 DIAGNOSIS — I130 Hypertensive heart and chronic kidney disease with heart failure and stage 1 through stage 4 chronic kidney disease, or unspecified chronic kidney disease: Secondary | ICD-10-CM | POA: Diagnosis not present

## 2020-04-13 DIAGNOSIS — D631 Anemia in chronic kidney disease: Secondary | ICD-10-CM | POA: Diagnosis not present

## 2020-04-13 DIAGNOSIS — I825Y2 Chronic embolism and thrombosis of unspecified deep veins of left proximal lower extremity: Secondary | ICD-10-CM | POA: Diagnosis not present

## 2020-04-13 DIAGNOSIS — F329 Major depressive disorder, single episode, unspecified: Secondary | ICD-10-CM | POA: Diagnosis not present

## 2020-04-13 DIAGNOSIS — E039 Hypothyroidism, unspecified: Secondary | ICD-10-CM | POA: Diagnosis not present

## 2020-04-13 DIAGNOSIS — M6281 Muscle weakness (generalized): Secondary | ICD-10-CM | POA: Diagnosis not present

## 2020-04-13 DIAGNOSIS — I5033 Acute on chronic diastolic (congestive) heart failure: Secondary | ICD-10-CM | POA: Diagnosis not present

## 2020-04-13 DIAGNOSIS — M79601 Pain in right arm: Secondary | ICD-10-CM | POA: Diagnosis not present

## 2020-04-13 DIAGNOSIS — M109 Gout, unspecified: Secondary | ICD-10-CM | POA: Diagnosis not present

## 2020-04-13 DIAGNOSIS — M79602 Pain in left arm: Secondary | ICD-10-CM | POA: Diagnosis not present

## 2020-04-13 DIAGNOSIS — R41841 Cognitive communication deficit: Secondary | ICD-10-CM | POA: Diagnosis not present

## 2020-04-13 DIAGNOSIS — Z20822 Contact with and (suspected) exposure to covid-19: Secondary | ICD-10-CM | POA: Diagnosis not present

## 2020-04-13 DIAGNOSIS — R531 Weakness: Secondary | ICD-10-CM

## 2020-04-13 DIAGNOSIS — Z681 Body mass index (BMI) 19 or less, adult: Secondary | ICD-10-CM | POA: Diagnosis not present

## 2020-04-13 DIAGNOSIS — R52 Pain, unspecified: Secondary | ICD-10-CM | POA: Diagnosis not present

## 2020-04-13 DIAGNOSIS — D649 Anemia, unspecified: Secondary | ICD-10-CM | POA: Diagnosis not present

## 2020-04-13 DIAGNOSIS — I10 Essential (primary) hypertension: Secondary | ICD-10-CM | POA: Diagnosis not present

## 2020-04-13 LAB — BASIC METABOLIC PANEL
Anion gap: 8 (ref 5–15)
BUN: 32 mg/dL — ABNORMAL HIGH (ref 8–23)
CO2: 20 mmol/L — ABNORMAL LOW (ref 22–32)
Calcium: 8.9 mg/dL (ref 8.9–10.3)
Chloride: 110 mmol/L (ref 98–111)
Creatinine, Ser: 2.15 mg/dL — ABNORMAL HIGH (ref 0.44–1.00)
GFR calc Af Amer: 23 mL/min — ABNORMAL LOW (ref >60)
GFR calc non Af Amer: 20 mL/min — ABNORMAL LOW (ref >60)
Glucose, Bld: 125 mg/dL — ABNORMAL HIGH (ref 70–99)
Potassium: 3.7 mmol/L (ref 3.5–5.1)
Sodium: 138 mmol/L (ref 135–145)

## 2020-04-13 LAB — C-REACTIVE PROTEIN: CRP: 9.1 mg/dL — ABNORMAL HIGH (ref <1.0)

## 2020-04-13 LAB — PROCALCITONIN: Procalcitonin: 2.37 ng/mL

## 2020-04-13 MED ORDER — ADULT MULTIVITAMIN W/MINERALS CH
1.0000 | ORAL_TABLET | Freq: Every day | ORAL | Status: DC
  Administered 2020-04-13: 1 via ORAL
  Filled 2020-04-13: qty 1

## 2020-04-13 MED ORDER — POLYETHYLENE GLYCOL 3350 17 G PO PACK: 17.0000 g | PACK | Freq: Every day | ORAL | 0 refills | Status: DC | PRN

## 2020-04-13 MED ORDER — PREDNISONE 5 MG PO TABS: 5.0000 mg | ORAL_TABLET | Freq: Every day | ORAL | 0 refills | Status: DC

## 2020-04-13 MED ORDER — BISACODYL 5 MG PO TBEC: 5.0000 mg | DELAYED_RELEASE_TABLET | Freq: Every day | ORAL | 0 refills | Status: DC | PRN

## 2020-04-13 MED ORDER — ENSURE ENLIVE PO LIQD
237.0000 mL | Freq: Two times a day (BID) | ORAL | Status: DC
  Administered 2020-04-13 (×2): 237 mL via ORAL

## 2020-04-13 MED ORDER — ADULT MULTIVITAMIN W/MINERALS CH: 1.0000 | ORAL_TABLET | Freq: Every day | ORAL | Status: DC

## 2020-04-13 MED ORDER — DOCUSATE SODIUM 100 MG PO CAPS: 100.0000 mg | ORAL_CAPSULE | Freq: Two times a day (BID) | ORAL | 0 refills | Status: DC

## 2020-04-13 MED ORDER — PREDNISONE 20 MG PO TABS: 40.0000 mg | ORAL_TABLET | Freq: Every day | ORAL | 0 refills | Status: AC

## 2020-04-13 MED ORDER — PREDNISONE 20 MG PO TABS: 40.0000 mg | ORAL_TABLET | Freq: Every day | ORAL | Status: DC

## 2020-04-13 MED ORDER — APIXABAN 2.5 MG PO TABS: 2.5000 mg | ORAL_TABLET | Freq: Two times a day (BID) | ORAL | 1 refills | Status: DC

## 2020-04-13 NOTE — TOC Transition Note (Signed)
Transition of Care [facility]) - CM/SW Discharge Note   Patient Details  Name: Patricia [name] MRN: [id number] Date of Birth: [birth year]  Transition of Care [facility]) CM/SW Contact:  Alexander Mt, LCSW Phone Number: 04/13/2020, 3:45 PM   Clinical Narrative:    CSW has attempted to reach pt husband Patricia [name] to notify him of d/c, unable at this time. Patricia [name] has just completed paperwork at [facility] and was aware pt was discharging today.   Outpatient palliative care referral made to [facility], RN, liaison with [facility]. Pt DNR signed on chart with PTAR papers. No controlled scripts noted. [facility] aware and ready, RN Maggie requested to call report. Number on chart.   Palliative provider Gregary Signs, NP also following and reaching out to pt husband for MOST form.   PTAR called for 4pm.  Final next level of care: [place] Barriers to Discharge: Barriers Resolved   Patient Goals and CMS Choice Patient states their goals for this hospitalization and ongoing recovery are:: return to [facility] and get stronger CMS Medicare.gov Compare Post Acute Care list provided to:: Patient (pt preferred choice [place]) Choice offered to / list presented to : Patient, Spouse  Discharge Placement   Existing PASRR number confirmed : 04/12/20          Patient [facility] bed at: [facility] Patient to be transferred to facility by: [place] Name of family member notified: pt husband Patricia [name] Patient and family notified of of transfer: 04/13/20  Discharge Plan and Services In-house Referral: Clinical Social Work Discharge Planning Services: CM Consult Post Acute Care Choice: [place]          Readmission Risk Interventions No flowsheet data found.

## 2020-04-13 NOTE — Discharge Summary (Signed)
Physician Discharge Summary  Patricia [name] TJQ:[id number] DOB: 09/25/1932 DOA: 04/11/2020  PCP: [name], MD  Admit date: 04/11/2020 Discharge date: 04/13/2020  Admitted From: Home.  Disposition:  SNF.   Recommendations for Outpatient Follow-up:  1. Follow up with PCP in 1-2 weeks 2. Please obtain BMP/CBC in one week Please follow up with rheumatology in one week.  Please follow up with palliative care as outpatient.   Discharge Condition: Guarded.  CODE STATUS:DNR Diet recommendation: Heart Healthy   Brief/Interim Summary: Patricia Furber Hicksis a 86 y.o.femalewith medical history significant ofkidney transplant (2009); CKD 3-4,  pre-DM; hypothyroidism; HTN; CAD; COPD, chronic debility, failure to thrive presented with weakness, body aches, arm pain. -They were at the beach last week and she started feeling weak/tired. They took her to the ER and they determined she had a problem in her stomach and they gave her NOS meds for that issue. She took it about 2-3 days without obvious improvement. Since then, she has been in bed, and reports her elbow and wrist are painful to move.  PT eval recommended SNF .  Her uric acid level elevated , she was started on prednisone and recommended to follow up with rheumatology in one week.   Discharge Diagnoses:  Principal Problem:   Generalized weakness Active Problems:   Hypothyroidism   COPD (chronic obstructive pulmonary disease) (HCC)   Diabetes type 2, controlled (HCC)   Chronic kidney disease, stage 4, severely decreased GFR (HCC)   Essential hypertension   Chronic diastolic CHF (congestive heart failure) (HCC)   Hyperlipidemia   Pain in both upper extremities   Failure to thrive in adult   Palliative care by specialist   Goals of care, counseling/discussion  Generalized weakness Fever, bilateral wrist and elbow pain Longstanding debility and failure to thrive -CT C-spine noted degenerative disease -She does have some  synovitis especially at both wrists and slightly the right elbow, her UA is elevated, possibly a mild gout flare. She was started on prednisone for 7 days ans recommended to follow up with rheumatology.  Negative  blood cultures so far.  -COVID-19 PCR is negative -PT OT eval, may need rehab, palliative medicine was also consulted given ongoing failure to thrive  Stage IV chronic kidney disease Prior renal transplant -Kidney function is stable, continue home regimen of Imuran and cyclosporine -Continue Bactrim for prophylaxis  Hypertension Stop clonidine on discharge.    Chronic diastolic CHF -Echo in 7622 with grade 2 diastolic dysfunction, preserved EF -Euvolemic at this time, stop IV fluids  History of DVT -Likely chronic, noted on duplex ultrasound in 2014 in 2018 -On Xarelto at baseline, this was changed to Eliquis given CKD  Hypothyroidism -Recent TSH was normal, continue home dose of Synthroid  Adult failure to thrive Severe protein calorie malnutrition Very poor functional status -She is DNR, palliative medicine was consulted . Recommend outpatient followup.    Discharge Instructions  Discharge Instructions    Diet - low sodium heart healthy   Complete by: As directed    Discharge instructions   Complete by: As directed    Please follow up with rheumatology in 1 week,.     Allergies as of 04/13/2020      Reactions   Macrolides And Ketolides Nausea Only   Aricept [donepezil Hcl] Nausea And Vomiting   Codeine Nausea And Vomiting   Morphine Nausea And Vomiting   Penicillin G Nausea Only   Penicillins Nausea And Vomiting, Other (See Comments)   HEADACHE Has patient  had a PCN reaction causing immediate rash, facial/tongue/throat swelling, SOB or lightheadedness with hypotension: unkn Has patient had a PCN reaction causing severe rash involving mucus membranes or skin necrosis: unkn Has patient had a PCN reaction that required hospitalization: unkn Has  patient had a PCN reaction occurring within the last 10 years: unkn If all of the above answers are "NO", then may proceed with Cephalosporin use.   Statins Other (See Comments)   Doesn't remember   Streptomycin Other (See Comments)   headache   Tetracycline Nausea Only   Tetracyclines & Related Rash      Medication List    STOP taking these medications   cloNIDine 0.1 MG tablet Commonly known as: CATAPRES   Xarelto 10 MG Tabs tablet Generic drug: rivaroxaban     TAKE these medications   acetaminophen 325 MG tablet Commonly known as: TYLENOL Take 650 mg by mouth every 6 (six) hours as needed.   albuterol 108 (90 Base) MCG/ACT inhaler Commonly known as: VENTOLIN HFA Inhale 2 puffs into the lungs every 6 (six) hours as needed for wheezing or shortness of breath.   apixaban 2.5 MG Tabs tablet Commonly known as: ELIQUIS Take 1 tablet (2.5 mg total) by mouth 2 (two) times daily.   azaTHIOprine 50 MG tablet Commonly known as: IMURAN Take 1 tablet (50 mg total) by mouth 2 (two) times daily.   b complex vitamins tablet Take 1 tablet by mouth daily.   Benadryl Itch Stopping 2 % Gel Generic drug: DIPHENHYDRAMINE HCL (TOPICAL) Apply 1 application topically daily as needed for itch relief   bisacodyl 5 MG EC tablet Commonly known as: DULCOLAX Take 1 tablet (5 mg total) by mouth daily as needed for moderate constipation.   budesonide-formoterol 160-4.5 MCG/ACT inhaler Commonly known as: Symbicort Inhale 2 puffs into the lungs in the morning and at bedtime.   cetirizine 10 MG tablet Commonly known as: ZYRTEC Take 1 tablet (10 mg total) by mouth daily.   cycloSPORINE modified 100 MG capsule Commonly known as: NEORAL Take 1 capsule (100 mg total) by mouth 2 (two) times daily.   docusate sodium 100 MG capsule Commonly known as: COLACE Take 1 capsule (100 mg total) by mouth 2 (two) times daily.   famotidine 20 MG tablet Commonly known as: PEPCID Take 20 mg by mouth  daily with supper.   ferrous sulfate 325 (65 FE) MG tablet Take 325 mg by mouth daily with breakfast.   FLUoxetine 20 MG tablet Commonly known as: PROZAC Take 1 tablet (20 mg total) by mouth daily.   fluticasone 50 MCG/ACT nasal spray Commonly known as: FLONASE Place 1 spray into both nostrils daily as needed for allergies or rhinitis.   Glucerna Liqd Take 237 mLs by mouth 2 (two) times daily.   levothyroxine 75 MCG tablet Commonly known as: SYNTHROID Take 1 tablet (75 mcg total) by mouth daily.   Lutein-Zeaxanthin 25-5 MG Caps Take 1 capsule by mouth daily.   metoCLOPramide 5 MG tablet Commonly known as: REGLAN Take 5 mg by mouth 4 (four) times daily.   mirabegron ER 50 MG Tb24 tablet Commonly known as: Myrbetriq Take 1 tablet (50 mg total) by mouth every evening.   multivitamin with minerals Tabs tablet Take 1 tablet by mouth daily. Start taking on: April 14, 2020   omeprazole 20 MG capsule Commonly known as: PRILOSEC Take 20 mg by mouth daily.   ondansetron 4 MG tablet Commonly known as: ZOFRAN Take 4 mg by mouth every 4 (  four) hours as needed for nausea/vomiting.   oxybutynin 10 MG 24 hr tablet Commonly known as: DITROPAN-XL Take 1 tablet (10 mg total) by mouth daily.   phenylephrine-shark liver oil-mineral oil-petrolatum 0.25-3-14-71.9 % rectal ointment Commonly known as: PREPARATION H Place 1 application rectally 2 (two) times daily as needed for hemorrhoids.   polyethylene glycol 17 g packet Commonly known as: MIRALAX / GLYCOLAX Take 17 g by mouth daily as needed for mild constipation.   pravastatin 40 MG tablet Commonly known as: PRAVACHOL Take 40 mg by mouth daily.   predniSONE 20 MG tablet Commonly known as: DELTASONE Take 2 tablets (40 mg total) by mouth daily before breakfast for 6 days. What changed: You were already taking a medication with the same name, and this prescription was added. Make sure you understand how and when to take each.    predniSONE 5 MG tablet Commonly known as: DELTASONE Take 1 tablet (5 mg total) by mouth daily with breakfast. Start taking on: April 20, 2020 What changed: These instructions start on April 20, 2020. If you are unsure what to do until then, ask your doctor or other care provider.   sulfamethoxazole-trimethoprim 400-80 MG tablet Commonly known as: BACTRIM Take 1 tablet by mouth every Monday, Wednesday, and Friday.   Vitamin D 50 MCG (2000 UT) tablet Take 2,000 Units by mouth every evening.       Contact information for follow-up providers    [name], Christiane Ha, MD. Schedule an appointment as soon as possible for a visit in 1 week(s).   Specialty: Internal Medicine Contact information: [address] [place] [address] [phone]            Contact information for after-discharge care    Destination    [facility] Preferred SNF .   Service: Skilled Nursing Contact information: [place] [phone]                 Allergies  Allergen Reactions  . Macrolides And Ketolides Nausea Only  . Aricept [Donepezil Hcl] Nausea And Vomiting  . Codeine Nausea And Vomiting  . Morphine Nausea And Vomiting  . Penicillin G Nausea Only  . Penicillins Nausea And Vomiting and Other (See Comments)    HEADACHE Has patient had a PCN reaction causing immediate rash, facial/tongue/throat swelling, SOB or lightheadedness with hypotension: unkn Has patient had a PCN reaction causing severe rash involving mucus membranes or skin necrosis: unkn Has patient had a PCN reaction that required hospitalization: unkn Has patient had a PCN reaction occurring within the last 10 years: unkn If all of the above answers are "NO", then may proceed with Cephalosporin use.   . Statins Other (See Comments)    Doesn't remember  . Streptomycin Other (See Comments)    headache   . Tetracycline Nausea Only  . Tetracyclines &  Related Rash    Consultations:  None.    Procedures/Studies: DG Chest 2 View  Result Date: 04/11/2020 CLINICAL DATA:  Fever, chills and body aches. EXAM: CHEST - 2 VIEW COMPARISON:  12/30/2019 FINDINGS: The cardiac silhouette, mediastinal and hilar contours are within normal limits and stable. Stable mild tortuosity and calcification of the thoracic aorta. Stable calcified granuloma in the left upper lobe. No acute pulmonary findings. No worrisome pulmonary lesions. The bony thorax is intact. Remote healed rib fractures are noted. IMPRESSION: No acute cardiopulmonary findings. Electronically Signed   By: Marijo Sanes M.D.   On:  04/11/2020 10:58   CT CERVICAL SPINE WO CONTRAST  Result Date: 04/11/2020 CLINICAL DATA:  Cervical osteoarthritis EXAM: CT CERVICAL SPINE WITHOUT CONTRAST TECHNIQUE: Multidetector CT imaging of the cervical spine was performed without intravenous contrast. Multiplanar CT image reconstructions were also generated. COMPARISON:  MRI 03/11/2013 FINDINGS: Alignment: No subluxation. Skull base and vertebrae: No acute fracture. No primary bone lesion or focal pathologic process. Prior left occipital craniectomy. Soft tissues and spinal canal: No prevertebral fluid or swelling. No visible canal hematoma. Disc levels: Diffuse degenerative disc disease with disc space narrowing and spurring, most pronounced at C5-6. Diffuse bilateral degenerative facet disease, moderate to advanced. Upper chest: Biapical scarring.  No acute findings. Other: Carotid artery calcifications. IMPRESSION: Diffuse mild degenerative disc disease. Diffuse moderate to advanced degenerative facet disease. No acute bony abnormality. Electronically Signed   By: Rolm Baptise M.D.   On: 04/11/2020 18:04      Subjective: No new complaints today.   Discharge Exam: Vitals:   04/13/20 0502 04/13/20 0818  BP: 127/70   Pulse: 68   Resp: 16   Temp: 97.8 F (36.6 C)   SpO2: 100% 98%   Vitals:   04/12/20 1947  04/12/20 2201 04/13/20 0502 04/13/20 0818  BP:  (!) 123/55 127/70   Pulse:  67 68   Resp: 17 16 16    Temp:  98.6 F (37 C) 97.8 F (36.6 C)   TempSrc:  Oral Oral   SpO2: 95% 97% 100% 98%  Weight:      Height:        General: Pt is alert, awake, not in acute distress Cardiovascular: RRR, S1/S2 +, no rubs, no gallops Respiratory: CTA bilaterally, no wheezing, no rhonchi Abdominal: Soft, NT, ND, bowel sounds + Extremities: no edema, no cyanosis    The results of significant diagnostics from this hospitalization (including imaging, microbiology, ancillary and laboratory) are listed below for reference.     Microbiology: Recent Results (from the past 240 hour(s))  Culture, blood (routine x 2)     Status: None (Preliminary result)   Collection Time: 04/11/20 11:31 AM   Specimen: BLOOD RIGHT FOREARM  Result Value Ref Range Status   Specimen Description BLOOD RIGHT FOREARM  Final   Special Requests   Final    BOTTLES DRAWN AEROBIC AND ANAEROBIC Blood Culture adequate volume   Culture   Final    NO GROWTH 2 DAYS Performed at [facility], [address] [place], [place] [address]    Report Status PENDING  Incomplete  Culture, blood (routine x 2)     Status: None (Preliminary result)   Collection Time: 04/11/20 11:34 AM   Specimen: BLOOD  Result Value Ref Range Status   Specimen Description BLOOD LEFT ANTECUBITAL  Final   Special Requests   Final    BOTTLES DRAWN AEROBIC AND ANAEROBIC Blood Culture adequate volume   Culture   Final    NO GROWTH 2 DAYS Performed at [facility], [place] [address] [place], [place] [address]    Report Status PENDING  Incomplete  SARS Coronavirus 2 by RT PCR (hospital order, performed in [facility] hospital lab) Nasopharyngeal Nasopharyngeal Swab     Status: None   Collection Time: 04/11/20  5:11 PM   Specimen: Nasopharyngeal Swab  Result Value Ref Range Status   SARS Coronavirus 2 NEGATIVE NEGATIVE Final    Comment:  (NOTE) SARS-CoV-2 target nucleic acids are NOT DETECTED.  The SARS-CoV-2 RNA is generally detectable in upper and  lower respiratory specimens during the acute phase of infection. The lowest concentration of SARS-CoV-2 viral copies this assay can detect is 250 copies / mL. A negative result does not preclude SARS-CoV-2 infection and should not be used as the sole basis for treatment or other patient management decisions.  A negative result may occur with improper specimen collection / handling, submission of specimen other than nasopharyngeal swab, presence of viral mutation(s) within the areas targeted by this assay, and inadequate number of viral copies (<250 copies / mL). A negative result must be combined with clinical observations, patient history, and epidemiological information.  Fact Sheet for Patients:   StrictlyIdeas.no  Fact Sheet for Healthcare Providers: BankingDealers.co.za  This test is not yet approved or  cleared by the [place] FDA and has been authorized for detection and/or diagnosis of SARS-CoV-2 by FDA under an Emergency Use Authorization (EUA).  This EUA will remain in effect (meaning this test can be used) for the duration of the COVID-19 declaration under Section 564(b)(1) of the Act, 21 U.S.C. section 360bbb-3(b)(1), unless the authorization is terminated or revoked sooner.  Performed at [facility], [place] [address] [place], [place] [address]      Labs: BNP (last 3 results) No results for input(s): BNP in the last 8760 hours. Basic Metabolic Panel: Recent Labs  Lab 04/11/20 1033 04/12/20 0255 04/13/20 0903  NA 141 139 138  K 3.0* 3.0* 3.7  CL 107 109 110  CO2 19* 21* 20*  GLUCOSE 165* 216* 125*  BUN 17 23 32*  CREATININE 1.83* 1.67* 2.15*  CALCIUM 9.1 8.8* 8.9   Liver Function Tests: Recent Labs  Lab 04/11/20 1033  AST 20  ALT 9  ALKPHOS 67  BILITOT 1.2  PROT 6.4*  ALBUMIN  2.8*   No results for input(s): LIPASE, AMYLASE in the last 168 hours. No results for input(s): AMMONIA in the last 168 hours. CBC: Recent Labs  Lab 04/11/20 1033 04/12/20 0255  WBC 10.7* 8.1  NEUTROABS 7.8*  --   HGB 12.2 10.9*  HCT 37.1 32.2*  MCV 97.6 95.8  PLT 224 174   Cardiac Enzymes: Recent Labs  Lab 04/11/20 1130  CKTOTAL 38   BNP: Invalid input(s): POCBNP CBG: No results for input(s): GLUCAP in the last 168 hours. D-Dimer No results for input(s): DDIMER in the last 72 hours. Hgb A1c No results for input(s): HGBA1C in the last 72 hours. Lipid Profile No results for input(s): CHOL, HDL, LDLCALC, TRIG, CHOLHDL, LDLDIRECT in the last 72 hours. Thyroid function studies No results for input(s): TSH, T4TOTAL, T3FREE, THYROIDAB in the last 72 hours.  Invalid input(s): FREET3 Anemia work up No results for input(s): VITAMINB12, FOLATE, FERRITIN, TIBC, IRON, RETICCTPCT in the last 72 hours. Urinalysis    Component Value Date/Time   COLORURINE YELLOW 04/11/2020 1811   APPEARANCEUR CLEAR 04/11/2020 1811   LABSPEC 1.025 04/11/2020 1811   PHURINE 5.5 04/11/2020 1811   GLUCOSEU NEGATIVE 04/11/2020 1811   HGBUR MODERATE (A) 04/11/2020 1811   BILIRUBINUR NEGATIVE 04/11/2020 1811   KETONESUR NEGATIVE 04/11/2020 1811   PROTEINUR >300 (A) 04/11/2020 1811   UROBILINOGEN 0.2 08/20/2015 1310   NITRITE NEGATIVE 04/11/2020 1811   LEUKOCYTESUR TRACE (A) 04/11/2020 1811   Sepsis Labs Invalid input(s): PROCALCITONIN,  WBC,  LACTICIDVEN Microbiology Recent Results (from the past 240 hour(s))  Culture, blood (routine x 2)     Status: None (Preliminary result)   Collection Time: 04/11/20 11:31 AM   Specimen: BLOOD RIGHT FOREARM  Result Value Ref Range Status   Specimen Description BLOOD RIGHT FOREARM  Final   Special Requests   Final    BOTTLES DRAWN AEROBIC AND ANAEROBIC Blood Culture adequate volume   Culture   Final    NO GROWTH 2 DAYS Performed at [place], [address] [place], [place] [address]    Report Status PENDING  Incomplete  Culture, blood (routine x 2)     Status: None (Preliminary result)   Collection Time: 04/11/20 11:34 AM   Specimen: BLOOD  Result Value Ref Range Status   Specimen Description BLOOD LEFT ANTECUBITAL  Final   Special Requests   Final    BOTTLES DRAWN AEROBIC AND ANAEROBIC Blood Culture adequate volume   Culture   Final    NO GROWTH 2 DAYS Performed at [facility], [place] [address], [place], [place] [address]    Report Status PENDING  Incomplete  SARS Coronavirus 2 by RT PCR (hospital order, performed in [facility] hospital lab) Nasopharyngeal Nasopharyngeal Swab     Status: None   Collection Time: 04/11/20  5:11 PM   Specimen: Nasopharyngeal Swab  Result Value Ref Range Status   SARS Coronavirus 2 NEGATIVE NEGATIVE Final    Comment: (NOTE) SARS-CoV-2 target nucleic acids are NOT DETECTED.  The SARS-CoV-2 RNA is generally detectable in upper and lower respiratory specimens during the acute phase of infection. The lowest concentration of SARS-CoV-2 viral copies this assay can detect is 250 copies / mL. A negative result does not preclude SARS-CoV-2 infection and should not be used as the sole basis for treatment or other patient management decisions.  A negative result may occur with improper specimen collection / handling, submission of specimen other than nasopharyngeal swab, presence of viral mutation(s) within the areas targeted by this assay, and inadequate number of viral copies (<250 copies / mL). A negative result must be combined with clinical observations, patient history, and epidemiological information.  Fact Sheet for Patients:   StrictlyIdeas.no  Fact Sheet for Healthcare Providers: BankingDealers.co.za  This test is not yet approved or  cleared by the [place] FDA and has been authorized for detection and/or diagnosis of  SARS-CoV-2 by FDA under an Emergency Use Authorization (EUA).  This EUA will remain in effect (meaning this test can be used) for the duration of the COVID-19 declaration under Section 564(b)(1) of the Act, 21 U.S.C. section 360bbb-3(b)(1), unless the authorization is terminated or revoked sooner.  Performed at [facility], [place] [address], [place], [place] [address]      Time coordinating discharge: 35 minutes  SIGNED:   Hosie Poisson, MD  [facility] 04/13/2020, 2:00 PM

## 2020-04-13 NOTE — Progress Notes (Signed)
Daily Progress Note   Patient Name: Patricia [name]       Date: 04/13/2020 DOB: 1932-08-03  Age: 86 y.o. MRN#: [id number] Attending Physician: Hosie Poisson, MD Primary Care Physician: [name], MD Admit Date: 04/11/2020  HPI/Patient Profile: 86 y.o. female  with past medical history of CKD 3-4 status post kidney transplant (2009), CAD, COPD, HTN, hypothyroidism, chronic debility, and failure to thrive admitted on 04/11/2020 with weakness, body aches, and arm pain. She developed increased fatigue and weakness while at the beach last week, family took her to the ED and it was determined she had a problem in her stomach and gave her NOS meds. She took this 2-3 days without obvious improvement. She was unable to get out of bed and would scream out in pain if her arms were touched. Per EMS, patient had a fever of 102 on their arrival to the home, but has been febrile since with no obvious source of infection.  Palliative Medicine has been consulted to assist with [place] discussion in the setting of chronic debility and failure to thrive.   Subjective: Patient is OOB to chair, alert. She tells me she is awaiting transport to [facility] this afternoon. She reports her appetite is somewhat improved today. As per our discussion yesterday, I recommended follow-up with outpatient palliative care and she is agreeable to this. Outside the room, I called patient's husband hoping he can sign the MOST form, unfortunately he did not answer. Ultimately, patient was able to sign the form with an "x", and this was witnessed by department nursing staff.    Length of Stay: 0   Physical Exam HENT:     Head: Normocephalic and atraumatic.  Pulmonary:     Effort: Pulmonary effort is normal.  Neurological:      Mental Status: She is alert.             Vital Signs: BP 127/70 (BP Location: Right Arm)    Pulse 68    Temp 97.8 F (36.6 C) (Oral)    Resp 16    Ht 5\' 5"  (1.651 m)    Wt 51.3 kg    SpO2 98%    BMI 18.82 kg/m  SpO2: SpO2: 98 % O2 Device: O2 Device: Room Air O2 Flow Rate:    Intake/output summary:  Intake/Output Summary (Last 24 hours) at 04/13/2020 1441 Last data filed at 04/13/2020 1315 Gross per 24 hour  Intake 360 ml  Output 350 ml  Net 10 ml   LBM:   Baseline Weight: Weight: 51.3 kg Most recent weight: Weight: 51.3 kg       Palliative Assessment/Data: 40%      Palliative Care Assessment & Plan   Assessment: Patient with poor overall functional status and failure to thrive.   Recommendations/Plan: Follow-up with outpatient palliative Continue mirtazapine 7.5 nightly for appetite stimulant Continue nutritional supplements MOST form placed on chart and photo image is scanned into [facility]  Scope of treatment(per MOST form completed 04/12/20):  Cardiopulmonary Resuscitation: Do Not Attempt Resuscitation (DNR/No CPR)  Medical Interventions: Limited Additional Interventions: Use medical treatment, IV fluids and cardiac monitoring as indicated. DO NOT USE intubation or mechanical ventilation. May consider use of less invasive airway support such as BiPAP or CPAP. Also provide comfort measures. Transfer to the hospital if indicated. Avoid intensive care.  Antibiotics: - Determine use or limitation of antibiotics when infection occurs  IV Fluids: - IV fluids for a defined trial period (patient states 1 week)  Feeding Tube: - No feeding tube     Code Status: DNR/DNI  Prognosis:  Unable to determine  Discharge Planning: [place] for rehab with Palliative care service follow-up  Thank you for allowing the Palliative Medicine Team to assist in the care of this patient.   Total Time 35 minutes Prolonged Time Billed  no       Greater than 50%  of  this time was spent counseling and coordinating care related to the above assessment and plan.  Lavena Bullion, NP  Please contact Palliative Medicine Team phone at [phone] for questions and concerns.

## 2020-04-13 NOTE — Progress Notes (Signed)
[facility] [place])   Pt referred for outpatient palliative care services at SNF.  [facility] will f/u with [employer] and begin services.  Thank you, Venia Carbon RN, BSN, [facility] Liaison

## 2020-04-13 NOTE — Discharge Instructions (Signed)
Information on my medicine - ELIQUIS (apixaban)  Why was Eliquis prescribed for you? Eliquis was prescribed to treat blood clots that may have been found in the veins of your legs (deep vein thrombosis) or in your lungs (pulmonary embolism) and to reduce the risk of them occurring again.  What do You need to know about Eliquis ? The ONE 2.5 mg tablet taken TWICE daily.  Eliquis may be taken with or without food.   Try to take the dose about the same time in the morning and in the evening. If you have difficulty swallowing the tablet whole please discuss with your pharmacist how to take the medication safely.  Take Eliquis exactly as prescribed and DO NOT stop taking Eliquis without talking to the doctor who prescribed the medication.  Stopping may increase your risk of developing a new blood clot.  Refill your prescription before you run out.  After discharge, you should have regular check-up appointments with your healthcare provider that is prescribing your Eliquis.    What do you do if you miss a dose? If a dose of ELIQUIS is not taken at the scheduled time, take it as soon as possible on the same day and twice-daily administration should be resumed. The dose should not be doubled to make up for a missed dose.  Important Safety Information A possible side effect of Eliquis is bleeding. You should call your healthcare provider right away if you experience any of the following: ? Bleeding from an injury or your nose that does not stop. ? Unusual colored urine (red or dark brown) or unusual colored stools (red or black). ? Unusual bruising for unknown reasons. ? A serious fall or if you hit your head (even if there is no bleeding).  Some medicines may interact with Eliquis and might increase your risk of bleeding or clotting while on Eliquis. To help avoid this, consult your healthcare provider or pharmacist prior to using any new prescription or non-prescription medications,  including herbals, vitamins, non-steroidal anti-inflammatory drugs (NSAIDs) and supplements.  This website has more information on Eliquis (apixaban): http://www.eliquis.com/eliquis/home

## 2020-04-13 NOTE — Plan of Care (Signed)
  Problem: Education: Goal: Knowledge of General Education information will improve Description Including pain rating scale, medication(s)/side effects and non-pharmacologic comfort measures Outcome: Progressing   

## 2020-04-13 NOTE — Social Work (Signed)
Clinical Social Worker facilitated patient discharge including contacting patient family and facility to confirm patient discharge plans.  Clinical information faxed to facility and family agreeable with plan.  CSW arranged ambulance transport via [facility] to [facility]. RN to call [phone] with report prior to discharge.  Clinical Social Worker will sign off for now as social work intervention is no longer needed. Please consult us again if new need arises.  Ahmoni Edge, MSW, LCSW Clinical Social Worker   

## 2020-04-13 NOTE — Progress Notes (Signed)
Initial Nutrition Assessment  RD working remotely.  DOCUMENTATION CODES:   Not applicable  INTERVENTION:   -Downgrade diet to dysphagia 3 (advanced mechanical soft) for ease of intake -MVI with minerals daily -Ensure Enlive po BID, each supplement provides 350 kcal and 20 grams of protein -Magic cup TID with meals, each supplement provides 290 kcal and 9 grams of protein -Feeding assistance with meals  NUTRITION DIAGNOSIS:   Inadequate oral intake related to poor appetite as evidenced by meal completion < 50%.  GOAL:   Patient will meet greater than or equal to 90% of their needs  MONITOR:   PO intake, Supplement acceptance, Labs, Weight trends, Skin, I & O's  REASON FOR ASSESSMENT:   Consult Poor PO  ASSESSMENT:   Patricia [name] is a 86 y.o. female with medical history significant of kidney transplant (2009); pre-DM; hypothyroidism; HTN; CAD; and mild COPD presenting with lethargy  Pt admitted with generalized weakness.   Reviewed I/O's: +812 ml x 24 hour and +2 L since admission  UOP: 300 ml x 24 hours  Attempted to speak with pt via phone x 2, however, no answer.   Palliative care consulted for goals of care discussions. Pt does not desired a feeding tube or artificial feeding. She complains of poor appetite and early satiety. Additionally, pt has several missing teeth due to lost dentures. She is amenable to oral nutrition supplements.   Per chart review, pt unable to sign paper work related to hand tremors. Pt would benefit from feeding assistance.   Reviewed wt hx loss; pt has experienced a 3.6% wt loss over the past 3 months. While this is not significant for time frame, it is concerning given her advanced age, multiple co-morbidities and decreased appetite. Pt at high risk for malnutrition, however, unable to identify at this time.   Per TOC team notes, plan to d/c to SNF once medically stable for discharge.   Medications reviewed and include colace,  reglan, remeron, and prednisone.   Labs reviewed: K: 3.0.   Diet Order:   Diet Order            Diet Heart Room service appropriate? Yes; Fluid consistency: Thin  Diet effective now                 EDUCATION NEEDS:   No education needs have been identified at this time  Skin:  Skin Assessment: Reviewed RN Assessment  Last BM:  Unknown  Height:   Ht Readings from Last 1 Encounters:  04/11/20 5\' 5"  (1.651 m)    Weight:   Wt Readings from Last 1 Encounters:  04/11/20 51.3 kg    Ideal Body Weight:  56.8 kg  BMI:  Body mass index is 18.82 kg/m.  Estimated Nutritional Needs:   Kcal:  1400-1600  Protein:  65-80 grams  Fluid:  > 1.4 L    Loistine Chance, RD, LDN, [place] Registered Dietitian II Certified Diabetes Care and Education Specialist Please refer to [facility] for RD and/or RD on-call/weekend/after hours pager

## 2020-04-15 ENCOUNTER — Encounter: Payer: Self-pay | Admitting: Internal Medicine

## 2020-04-15 ENCOUNTER — Non-Acute Institutional Stay (SKILLED_NURSING_FACILITY): Payer: Medicare PPO | Admitting: Internal Medicine

## 2020-04-15 DIAGNOSIS — R531 Weakness: Secondary | ICD-10-CM | POA: Diagnosis not present

## 2020-04-15 DIAGNOSIS — M109 Gout, unspecified: Secondary | ICD-10-CM

## 2020-04-15 DIAGNOSIS — R634 Abnormal weight loss: Secondary | ICD-10-CM

## 2020-04-15 DIAGNOSIS — F329 Major depressive disorder, single episode, unspecified: Secondary | ICD-10-CM

## 2020-04-15 DIAGNOSIS — I825Y2 Chronic embolism and thrombosis of unspecified deep veins of left proximal lower extremity: Secondary | ICD-10-CM

## 2020-04-15 DIAGNOSIS — Z940 Kidney transplant status: Secondary | ICD-10-CM | POA: Diagnosis not present

## 2020-04-15 DIAGNOSIS — M353 Polymyalgia rheumatica: Secondary | ICD-10-CM | POA: Diagnosis not present

## 2020-04-15 DIAGNOSIS — R627 Adult failure to thrive: Secondary | ICD-10-CM | POA: Diagnosis not present

## 2020-04-15 DIAGNOSIS — N184 Chronic kidney disease, stage 4 (severe): Secondary | ICD-10-CM | POA: Diagnosis not present

## 2020-04-15 DIAGNOSIS — F32A Depression, unspecified: Secondary | ICD-10-CM

## 2020-04-15 DIAGNOSIS — E034 Atrophy of thyroid (acquired): Secondary | ICD-10-CM

## 2020-04-15 DIAGNOSIS — D631 Anemia in chronic kidney disease: Secondary | ICD-10-CM

## 2020-04-15 NOTE — Progress Notes (Signed)
Provider:  Rexene Edison. Mariea Clonts, D.O., C.M.D. Location:  [facility] Room Number: [place] of Service:  SNF (31)  PCP: [name], MD Patient Care Team: [name], MD as PCP - General (Internal Medicine)  Extended Emergency Contact Information Primary Emergency Contact: Karna Christmas Address: [address]          [place] of [place] Phone: [phone] Mobile Phone: [phone] Relation: Spouse Secondary Emergency Contact: Franki Cabot, [place] Mobile Phone: [phone] Relation: Son  Code Status: DNR Goals of Care: Advanced Directive information Advanced Directives 04/15/2020  Does Patient Have a Medical Advance Directive? Yes  Type of Advance Directive Out of facility DNR (pink MOST or yellow form)  Does patient want to make changes to medical advance directive? No - Patient declined  Copy of [place] in Chart? -  Would patient like information on creating a medical advance directive? -  Pre-existing out of facility DNR order (yellow form or pink MOST form) Pink MOST/Yellow Form most recent copy in chart - Physician notified to receive inpatient order      Chief Complaint  Patient presents with  . New Admit To SNF    New Admit to SNF     HPI: Patient is a 86 y.o. female with h/o kidney transplant in 2009 with CKDIV on imuran and cyclosporine plus 3x weekly bactrim, prediabetes, hypothyroidism with recent normal TSH, prediabetes with hba1c 5.1, htn, CAD, chronic diastolic chf grade 2 in 3419, mild COPD, depression on prozac seen today for admission to [facility] s/p hospitalization at [facility] from 6/28-6/30/21.  She was here previously for rehab and discharged in April.  She had presented this time with weakness, malaise and [place] and [place].  She went to the ED there and she was given meds for her  stomach.  She did not get better after 2-3 days and she was then staying in bed.  She was in severe pain from her elbows to her hands.  She has used a wheelchair for mobility for several years.  Her husband reported she spent much time lying on the couch and the only new thing was she was not eating or drinking or taking meds since 6/25 (missed three doses of her steroids).   Was given stress dose steroids in case of adrenal crisis, then resumed usual prednisone 6/29.   She's had her covid vaccines and covid PCR was negative.  CT c-spine was ordered for her arm pain due to decreased neck ROM.  It did show some degenerative disease, but she was felt to have synovitis of both wrists and her right elbow, possibly a gout flare so given a course of prednisone for 7 days per rheum and then to f/u with them in a week.  She is here for PT, OT.  A palliative care consult was requested.  Code status is DNR.  She has h/o DVT and due to her CKD, she was changed from xarelto to eliquis due to progression.  Here today, she reports improvement in her arm pain, but has a tremor when eating with her right hand.  She does not have upper dentures and is struggling to eat some coleslaw that has celery chunks in it--was picking the chunks out after trying to chew and putting them on her tray.  She  did not seem at all interested in the visit.  When I returned, she was trying to rest in bed.    Past Medical History:  Diagnosis Date  . Abnormal gait   . Anxiety   . Arthritis   . Asthma   . COPD, mild ([place])   . Coronary artery disease    pt denies  . Depression   . Diverticulitis   . Dizziness   . DVT (deep venous thrombosis) ([place])   . GERD (gastroesophageal reflux disease)   . Hypertension   . Hypothyroidism   . Multiple allergies   . Pneumonia   . Pre-diabetes   . Reflux   . Renal disorder    had kidney transplant in 2009   Past Surgical History:  Procedure Laterality Date  . ANTERIOR APPROACH HEMI HIP  ARTHROPLASTY Right 02/10/2016   Procedure: TOTAL HIP ARTHROPLASTY ANTERIOR APPROACH;  Surgeon: Rod Can, MD;  Location: [facility];  Service: Orthopedics;  Laterality: Right;  . CHOLECYSTECTOMY N/A 07/01/2018   Procedure: LAPAROSCOPIC CHOLECYSTECTOMY WITH INTRAOPERATIVE CHOLANGIOGRAM;  Surgeon: Jovita Kussmaul, MD;  Location: [facility];  Service: General;  Laterality: N/A;  . COLONOSCOPY    . NEPHRECTOMY TRANSPLANTED ORGAN    . TUMOR REMOVAL     Brain     Social History   Socioeconomic History  . Marital status: Married    Spouse name: [name]  . Number of children: 2  . Years of education: college  . Highest education level: Not on file  Occupational History  . Occupation: retired  Tobacco Use  . Smoking status: Never Smoker  . Smokeless tobacco: Never Used  Vaping Use  . Vaping Use: Never used  Substance and Sexual Activity  . Alcohol use: No  . Drug use: No  . Sexual activity: Not on file  Other Topics Concern  . Not on file  Social History Narrative   Patient lives at home with her husband [name]). Patient is retired. Patient has two children. Patient has college education.   Right handed.   Caffeine- None- Tea during day one cup   Social Determinants of Health   Financial Resource Strain:   . Difficulty of Paying Living Expenses:   Food Insecurity:   . Worried About Charity fundraiser in the Last Year:   . Arboriculturist in the Last Year:   Transportation Needs:   . Film/video editor (Medical):   Marland Kitchen Lack of Transportation (Non-Medical):   Physical Activity:   . Days of Exercise per Week:   . Minutes of Exercise per Session:   Stress:   . Feeling of Stress :   Social Connections:   . Frequency of Communication with Friends and Family:   . Frequency of Social Gatherings with Friends and Family:   . Attends Religious Services:   . Active Member of Clubs or Organizations:   . Attends Archivist Meetings:   Marland Kitchen Marital Status:     reports that she  has never smoked. She has never used smokeless tobacco. She reports that she does not drink alcohol and does not use drugs.  Functional Status Survey:    Family History  Problem Relation Age of Onset  . Alzheimer's disease Mother   . High Cholesterol Mother   . Alcoholism Father     Health Maintenance  Topic Date Due  . FOOT EXAM  Never done  . OPHTHALMOLOGY EXAM  Never done  . URINE MICROALBUMIN  Never done  .  PNA vac Low Risk Adult (1 of 2 - PCV13) Never done  . HEMOGLOBIN A1C  12/30/2018  . COVID-19 Vaccine (2 - Moderna 2-dose series) 12/26/2019  . TETANUS/TDAP  10/16/2023 (Originally 04/01/1951)  . INFLUENZA VACCINE  05/15/2020  . DEXA SCAN  Completed    Allergies  Allergen Reactions  . Macrolides And Ketolides Nausea Only  . Aricept [Donepezil Hcl] Nausea And Vomiting  . Codeine Nausea And Vomiting  . Morphine Nausea And Vomiting  . Penicillin G Nausea Only  . Penicillins Nausea And Vomiting and Other (See Comments)    HEADACHE Has patient had a PCN reaction causing immediate rash, facial/tongue/throat swelling, SOB or lightheadedness with hypotension: unkn Has patient had a PCN reaction causing severe rash involving mucus membranes or skin necrosis: unkn Has patient had a PCN reaction that required hospitalization: unkn Has patient had a PCN reaction occurring within the last 10 years: unkn If all of the above answers are "NO", then may proceed with Cephalosporin use.   . Statins Other (See Comments)    Doesn't remember  . Streptomycin Other (See Comments)    headache   . Tetracycline Nausea Only  . Tetracyclines & Related Rash    Outpatient Encounter Medications as of 04/15/2020  Medication Sig  . acetaminophen (TYLENOL) 325 MG tablet Take 650 mg by mouth every 6 (six) hours as needed.  Marland Kitchen albuterol (VENTOLIN HFA) 108 (90 Base) MCG/ACT inhaler Inhale 2 puffs into the lungs every 6 (six) hours as needed for wheezing or shortness of breath.  Marland Kitchen apixaban (ELIQUIS)  2.5 MG TABS tablet Take 1 tablet (2.5 mg total) by mouth 2 (two) times daily.  Marland Kitchen azaTHIOprine (IMURAN) 50 MG tablet Take 1 tablet (50 mg total) by mouth 2 (two) times daily.  Marland Kitchen b complex vitamins tablet Take 1 tablet by mouth daily.  . bisacodyl (DULCOLAX) 5 MG EC tablet Take 1 tablet (5 mg total) by mouth daily as needed for moderate constipation.  . budesonide-formoterol (SYMBICORT) 160-4.5 MCG/ACT inhaler Inhale 2 puffs into the lungs in the morning and at bedtime.  . cetirizine (ZYRTEC) 10 MG tablet Take 1 tablet (10 mg total) by mouth daily.  . Cholecalciferol (VITAMIN D) 2000 UNITS tablet Take 2,000 Units by mouth every evening.   . cycloSPORINE modified (NEORAL) 100 MG capsule Take 1 capsule (100 mg total) by mouth 2 (two) times daily.  Marland Kitchen DIPHENHYDRAMINE HCL, TOPICAL, (BENADRYL ITCH STOPPING) 2 % GEL Apply 1 application topically daily as needed for itch relief  . docusate sodium (COLACE) 100 MG capsule Take 1 capsule (100 mg total) by mouth 2 (two) times daily.  . famotidine (PEPCID) 20 MG tablet Take 20 mg by mouth daily with supper.  . ferrous sulfate 325 (65 FE) MG tablet Take 325 mg by mouth daily with breakfast.  . FLUoxetine (PROZAC) 20 MG tablet Take 1 tablet (20 mg total) by mouth daily.  . fluticasone (FLONASE) 50 MCG/ACT nasal spray Place 1 spray into both nostrils daily as needed for allergies or rhinitis.  . Glucerna (GLUCERNA) LIQD Take 237 mLs by mouth 2 (two) times daily.  Marland Kitchen levothyroxine (SYNTHROID) 75 MCG tablet Take 1 tablet (75 mcg total) by mouth daily.  . Lutein-Zeaxanthin 25-5 MG CAPS Take 1 capsule by mouth daily.  . metoCLOPramide (REGLAN) 5 MG tablet Take 5 mg by mouth 4 (four) times daily.  . mirabegron ER (MYRBETRIQ) 50 MG TB24 tablet Take 1 tablet (50 mg total) by mouth every evening.  . Multiple Vitamin (MULTIVITAMIN  WITH MINERALS) TABS tablet Take 1 tablet by mouth daily.  Marland Kitchen omeprazole (PRILOSEC) 20 MG capsule Take 20 mg by mouth daily.  . ondansetron  (ZOFRAN) 4 MG tablet Take 4 mg by mouth every 4 (four) hours as needed for nausea/vomiting.  Marland Kitchen oxybutynin (DITROPAN-XL) 10 MG 24 hr tablet Take 1 tablet (10 mg total) by mouth daily.  . phenylephrine-shark liver oil-mineral oil-petrolatum (PREPARATION H) 0.25-3-14-71.9 % rectal ointment Place 1 application rectally 2 (two) times daily as needed for hemorrhoids.  . polyethylene glycol (MIRALAX / GLYCOLAX) 17 g packet Take 17 g by mouth daily as needed for mild constipation.  . pravastatin (PRAVACHOL) 40 MG tablet Take 40 mg by mouth daily.  . predniSONE (DELTASONE) 20 MG tablet Take 2 tablets (40 mg total) by mouth daily before breakfast for 6 days.  Derrill Memo ON 04/20/2020] predniSONE (DELTASONE) 5 MG tablet Take 1 tablet (5 mg total) by mouth daily with breakfast.  . sulfamethoxazole-trimethoprim (BACTRIM) 400-80 MG tablet Take 1 tablet by mouth every Monday, Wednesday, and Friday.   No facility-administered encounter medications on file as of 04/15/2020.    Review of Systems  Constitutional: Positive for malaise/fatigue and weight loss. Negative for chills and fever.       Down 20 lbs since October of 2019  HENT: Negative for congestion and sore throat.   Respiratory: Negative for cough and shortness of breath.   Cardiovascular: Negative for chest pain, palpitations and leg swelling.  Gastrointestinal: Negative for abdominal pain, blood in stool and melena.  Genitourinary: Negative for dysuria.  Musculoskeletal: Positive for joint pain.  Neurological: Negative for dizziness and loss of consciousness.  Psychiatric/Behavioral: Positive for depression.       Loss of appetite    Vitals:   04/15/20 1340  BP: (!) 157/86  Pulse: 90  Temp: 97.7 F (36.5 C)  Weight: 113 lb 1.6 oz (51.3 kg)  Height: 5\' 5"  (1.651 m)   Body mass index is 18.82 kg/m. Physical Exam Vitals reviewed.  Constitutional:      General: She is not in acute distress.    Appearance: She is not toxic-appearing.      Comments: Flat affect  HENT:     Head: Normocephalic and atraumatic.     Right Ear: External ear normal.     Left Ear: External ear normal.     Nose: Nose normal.     Mouth/Throat:     Comments: Edentulous upper gum Eyes:     Extraocular Movements: Extraocular movements intact.     Pupils: Pupils are equal, round, and reactive to light.  Cardiovascular:     Rate and Rhythm: Normal rate and regular rhythm.     Pulses: Normal pulses.     Heart sounds: Normal heart sounds.  Pulmonary:     Effort: Pulmonary effort is normal.     Breath sounds: Normal breath sounds. No wheezing, rhonchi or rales.  Abdominal:     General: Bowel sounds are normal. There is no distension.     Tenderness: There is no abdominal tenderness. There is no guarding or rebound.  Musculoskeletal:     Cervical back: Neck supple.     Right lower leg: No edema.     Left lower leg: No edema.     Comments: No longer tender over wrists, elbows, no visible swelling, palpable warmth, erythema  Neurological:     Mental Status: She is alert.     Motor: Weakness present.     Gait: Gait abnormal.  Comments: Rapid tremor of right hand while trying to feed herself; in wheelchair  Psychiatric:     Comments: Flat affect     Labs reviewed: Basic Metabolic Panel: Recent Labs    04/11/20 1033 04/12/20 0255 04/13/20 0903  NA 141 139 138  K 3.0* 3.0* 3.7  CL 107 109 110  CO2 19* 21* 20*  GLUCOSE 165* 216* 125*  BUN 17 23 32*  CREATININE 1.83* 1.67* 2.15*  CALCIUM 9.1 8.8* 8.9   Liver Function Tests: Recent Labs    12/30/19 0758 04/11/20 1033  AST 24 20  ALT 13 9  ALKPHOS 63 67  BILITOT 1.0 1.2  PROT 6.9 6.4*  ALBUMIN 3.2* 2.8*   No results for input(s): LIPASE, AMYLASE in the last 8760 hours. No results for input(s): AMMONIA in the last 8760 hours. CBC: Recent Labs    12/31/19 0411 04/11/20 1033 04/12/20 0255  WBC 7.0 10.7* 8.1  NEUTROABS  --  7.8*  --   HGB 10.0* 12.2 10.9*  HCT 30.2* 37.1  32.2*  MCV 100.3* 97.6 95.8  PLT 159 224 174   Cardiac Enzymes: Recent Labs    12/29/19 2223 04/11/20 1130  CKTOTAL 49 38   BNP: Invalid input(s): POCBNP Lab Results  Component Value Date   HGBA1C 5.1 07/01/2018   Lab Results  Component Value Date   TSH 2.384 12/30/2019   No results found for: VITAMINB12 No results found for: FOLATE No results found for: IRON, TIBC, FERRITIN  Imaging and Procedures obtained prior to SNF admission: DG Chest 2 View  Result Date: 04/11/2020 CLINICAL DATA:  Fever, chills and body aches. EXAM: CHEST - 2 VIEW COMPARISON:  12/30/2019 FINDINGS: The cardiac silhouette, mediastinal and hilar contours are within normal limits and stable. Stable mild tortuosity and calcification of the thoracic aorta. Stable calcified granuloma in the left upper lobe. No acute pulmonary findings. No worrisome pulmonary lesions. The bony thorax is intact. Remote healed rib fractures are noted. IMPRESSION: No acute cardiopulmonary findings. Electronically Signed   By: Marijo Sanes M.D.   On: 04/11/2020 10:58   CT CERVICAL SPINE WO CONTRAST  Result Date: 04/11/2020 CLINICAL DATA:  Cervical osteoarthritis EXAM: CT CERVICAL SPINE WITHOUT CONTRAST TECHNIQUE: Multidetector CT imaging of the cervical spine was performed without intravenous contrast. Multiplanar CT image reconstructions were also generated. COMPARISON:  MRI 03/11/2013 FINDINGS: Alignment: No subluxation. Skull base and vertebrae: No acute fracture. No primary bone lesion or focal pathologic process. Prior left occipital craniectomy. Soft tissues and spinal canal: No prevertebral fluid or swelling. No visible canal hematoma. Disc levels: Diffuse degenerative disc disease with disc space narrowing and spurring, most pronounced at C5-6. Diffuse bilateral degenerative facet disease, moderate to advanced. Upper chest: Biapical scarring.  No acute findings. Other: Carotid artery calcifications. IMPRESSION: Diffuse mild  degenerative disc disease. Diffuse moderate to advanced degenerative facet disease. No acute bony abnormality. Electronically Signed   By: Rolm Baptise M.D.   On: 04/11/2020 18:04    Assessment/Plan 1. Depression, unspecified depression type -will attempt to wean prozac and begin remeron 7.5mg  at hs to see if it helps mood and appetite  2. Generalized weakness -here for PT, OT after her hospitalization with FTT, PMR, synovitis  3. Failure to thrive in adult -seems depression is a component here so will try to optimize tx -encourage po intake, protein supplements, get her a diet she can eat w/o top dentures   4. Weight loss -again, -encourage po intake, protein supplements, get her a  diet she can eat w/o top dentures  -needs arrangements to get new denture up top so she can eat better in the long run  5. Chronic kidney disease, stage 4, severely decreased GFR (HCC) -Avoid nephrotoxic agents like nsaids, dose adjust renally excreted meds, hydrate. -has been worsening and with her current overall status, she is not a good HD candidate at all -here for rehab -would recommend palliative care involvement as was also suggested at the hospital  6. History of renal transplant -in the past -cont imuran, cyclosporine and bactrim prophylaxis -renal function has been declining more recently per hospital records  7. Anemia in stage 4 chronic kidney disease (HCC) -on oral iron at present -monitor  8. Synovitis of multiple sites due to gout -improving in arms -on stress dose steroids for 6 more days (6/30 to 7/5), then to return to home steroid dosing of 5mg  daily with breakfast  9. PMR (polymyalgia rheumatica) (HCC) -as in #8 -note that she'd missed her prednisone some prior to admission to the hospital and was felt to possibly be having some adrenal insufficiency at first hence her stress dose steroids  10.  Chronic DVT:  Now on eliquis instead of xarelto due to declining renal  function -rate controlled  11.  Hypothyroidism -cont current levothyroxine 39mcg daily Lab Results  Component Value Date   TSH 2.384 12/30/2019   Family/ staff Communication: discussed with ADON  Labs/tests ordered: no new added--monitor bmp, cbc  Marieke Lubke L. Isobella Ascher, D.O. [facility] [address], [place] [address] Cell Phone (Mon-Fri 8am-5pm):  [phone] On Call:  [phone] & follow prompts after 5pm & weekends Office Phone:  [phone] Office Fax:  [phone]

## 2020-04-16 LAB — CULTURE, BLOOD (ROUTINE X 2)
Culture: NO GROWTH
Culture: NO GROWTH
Special Requests: ADEQUATE
Special Requests: ADEQUATE

## 2020-04-17 ENCOUNTER — Encounter: Payer: Self-pay | Admitting: Internal Medicine

## 2020-04-17 NOTE — Addendum Note (Signed)
Addended by: Gayland Curry on: 04/17/2020 04:34 PM   Modules accepted: Orders

## 2020-04-19 LAB — CBC AND DIFFERENTIAL
HCT: 29 — AB (ref 36–46)
Hemoglobin: 9.6 — AB (ref 12.0–16.0)
Platelets: 264 (ref 150–399)
WBC: 7.9

## 2020-04-19 LAB — BASIC METABOLIC PANEL
BUN: 28 — AB (ref 4–21)
CO2: 23 — AB (ref 13–22)
Chloride: 108 (ref 99–108)
Creatinine: 1.4 — AB (ref 0.5–1.1)
Glucose: 93
Potassium: 3.6 (ref 3.4–5.3)
Sodium: 143 (ref 137–147)

## 2020-04-19 LAB — COMPREHENSIVE METABOLIC PANEL
Calcium: 9.6 (ref 8.7–10.7)
GFR calc Af Amer: 41.22
GFR calc non Af Amer: 35.56

## 2020-04-19 LAB — CBC: RBC: 3.02 — AB (ref 3.87–5.11)

## 2020-04-26 DIAGNOSIS — I130 Hypertensive heart and chronic kidney disease with heart failure and stage 1 through stage 4 chronic kidney disease, or unspecified chronic kidney disease: Secondary | ICD-10-CM | POA: Diagnosis not present

## 2020-04-26 DIAGNOSIS — E039 Hypothyroidism, unspecified: Secondary | ICD-10-CM | POA: Diagnosis not present

## 2020-04-26 DIAGNOSIS — D631 Anemia in chronic kidney disease: Secondary | ICD-10-CM | POA: Diagnosis not present

## 2020-04-26 DIAGNOSIS — N184 Chronic kidney disease, stage 4 (severe): Secondary | ICD-10-CM | POA: Diagnosis not present

## 2020-04-26 DIAGNOSIS — J45909 Unspecified asthma, uncomplicated: Secondary | ICD-10-CM | POA: Diagnosis not present

## 2020-04-26 DIAGNOSIS — J438 Other emphysema: Secondary | ICD-10-CM | POA: Diagnosis not present

## 2020-04-26 DIAGNOSIS — M1991 Primary osteoarthritis, unspecified site: Secondary | ICD-10-CM | POA: Diagnosis not present

## 2020-04-26 DIAGNOSIS — I5033 Acute on chronic diastolic (congestive) heart failure: Secondary | ICD-10-CM | POA: Diagnosis not present

## 2020-04-26 DIAGNOSIS — I2510 Atherosclerotic heart disease of native coronary artery without angina pectoris: Secondary | ICD-10-CM | POA: Diagnosis not present

## 2020-04-26 LAB — CBC AND DIFFERENTIAL
HCT: 26 — AB (ref 36–46)
Hemoglobin: 8.8 — AB (ref 12.0–16.0)
Platelets: 237 (ref 150–399)
WBC: 7

## 2020-04-26 LAB — BASIC METABOLIC PANEL
BUN: 22 — AB (ref 4–21)
CO2: 22 (ref 13–22)
Chloride: 110 — AB (ref 99–108)
Creatinine: 1.4 — AB (ref 0.5–1.1)
Glucose: 87
Potassium: 4.3 (ref 3.4–5.3)
Sodium: 142 (ref 137–147)

## 2020-04-26 LAB — IRON,TIBC AND FERRITIN PANEL
Iron: 50
TIBC: 161
UIBC: 111

## 2020-04-26 LAB — COMPREHENSIVE METABOLIC PANEL
Calcium: 8.8 (ref 8.7–10.7)
GFR calc Af Amer: 38.77
GFR calc non Af Amer: 33.45

## 2020-04-26 LAB — CBC: RBC: 2.74 — AB (ref 3.87–5.11)

## 2020-04-27 ENCOUNTER — Non-Acute Institutional Stay (SKILLED_NURSING_FACILITY): Payer: Medicare PPO | Admitting: Family

## 2020-04-27 ENCOUNTER — Encounter: Payer: Self-pay | Admitting: Family

## 2020-04-27 DIAGNOSIS — F32A Depression, unspecified: Secondary | ICD-10-CM

## 2020-04-27 DIAGNOSIS — R531 Weakness: Secondary | ICD-10-CM

## 2020-04-27 DIAGNOSIS — R2681 Unsteadiness on feet: Secondary | ICD-10-CM

## 2020-04-27 DIAGNOSIS — Z940 Kidney transplant status: Secondary | ICD-10-CM

## 2020-04-27 DIAGNOSIS — R634 Abnormal weight loss: Secondary | ICD-10-CM

## 2020-04-27 DIAGNOSIS — E034 Atrophy of thyroid (acquired): Secondary | ICD-10-CM | POA: Diagnosis not present

## 2020-04-27 DIAGNOSIS — I10 Essential (primary) hypertension: Secondary | ICD-10-CM | POA: Diagnosis not present

## 2020-04-27 DIAGNOSIS — I825Y2 Chronic embolism and thrombosis of unspecified deep veins of left proximal lower extremity: Secondary | ICD-10-CM

## 2020-04-27 DIAGNOSIS — K219 Gastro-esophageal reflux disease without esophagitis: Secondary | ICD-10-CM

## 2020-04-27 DIAGNOSIS — E782 Mixed hyperlipidemia: Secondary | ICD-10-CM

## 2020-04-27 DIAGNOSIS — R627 Adult failure to thrive: Secondary | ICD-10-CM | POA: Diagnosis not present

## 2020-04-27 DIAGNOSIS — M353 Polymyalgia rheumatica: Secondary | ICD-10-CM | POA: Diagnosis not present

## 2020-04-27 DIAGNOSIS — J42 Unspecified chronic bronchitis: Secondary | ICD-10-CM

## 2020-04-27 DIAGNOSIS — D631 Anemia in chronic kidney disease: Secondary | ICD-10-CM

## 2020-04-27 DIAGNOSIS — N184 Chronic kidney disease, stage 4 (severe): Secondary | ICD-10-CM

## 2020-04-27 DIAGNOSIS — F329 Major depressive disorder, single episode, unspecified: Secondary | ICD-10-CM | POA: Diagnosis not present

## 2020-04-27 DIAGNOSIS — K5901 Slow transit constipation: Secondary | ICD-10-CM

## 2020-04-27 MED ORDER — ACETAMINOPHEN 325 MG PO TABS: 650.0000 mg | ORAL_TABLET | Freq: Four times a day (QID) | ORAL | 0 refills | Status: DC | PRN

## 2020-04-27 MED ORDER — VITAMIN D 50 MCG (2000 UT) PO TABS: 2000.0000 [IU] | ORAL_TABLET | Freq: Every evening | ORAL | 0 refills | Status: DC

## 2020-04-27 MED ORDER — ONDANSETRON HCL 4 MG PO TABS: 4.0000 mg | ORAL_TABLET | ORAL | 0 refills | Status: DC | PRN

## 2020-04-27 MED ORDER — MIRTAZAPINE 15 MG PO TABS: 7.5000 mg | ORAL_TABLET | Freq: Every day | ORAL | 0 refills | Status: DC

## 2020-04-27 MED ORDER — B COMPLEX PO TABS: 1.0000 | ORAL_TABLET | Freq: Every day | ORAL | 0 refills | Status: DC

## 2020-04-27 MED ORDER — OXYBUTYNIN CHLORIDE ER 10 MG PO TB24: 10.0000 mg | ORAL_TABLET | Freq: Every day | ORAL | 0 refills | Status: DC

## 2020-04-27 MED ORDER — PHENYLEPH-SHARK LIV OIL-MO-PET 0.25-3-14-71.9 % RE OINT: 1.0000 "application " | TOPICAL_OINTMENT | Freq: Two times a day (BID) | RECTAL | 0 refills | Status: DC | PRN

## 2020-04-27 MED ORDER — AZATHIOPRINE 50 MG PO TABS: 50.0000 mg | ORAL_TABLET | Freq: Two times a day (BID) | ORAL | 0 refills | Status: DC

## 2020-04-27 MED ORDER — BISACODYL 5 MG PO TBEC: 5.0000 mg | DELAYED_RELEASE_TABLET | Freq: Every day | ORAL | 0 refills | Status: DC | PRN

## 2020-04-27 MED ORDER — LEVOTHYROXINE SODIUM 75 MCG PO TABS: 75.0000 ug | ORAL_TABLET | Freq: Every day | ORAL | 0 refills | Status: DC

## 2020-04-27 MED ORDER — SULFAMETHOXAZOLE-TRIMETHOPRIM 400-80 MG PO TABS: 1.0000 | ORAL_TABLET | ORAL | 0 refills | Status: DC

## 2020-04-27 MED ORDER — PRAVASTATIN SODIUM 40 MG PO TABS: 40.0000 mg | ORAL_TABLET | Freq: Every day | ORAL | 0 refills | Status: DC

## 2020-04-27 MED ORDER — ADULT MULTIVITAMIN W/MINERALS CH: 1.0000 | ORAL_TABLET | Freq: Every day | ORAL | 0 refills | Status: DC

## 2020-04-27 MED ORDER — METOCLOPRAMIDE HCL 5 MG PO TABS: 5.0000 mg | ORAL_TABLET | Freq: Four times a day (QID) | ORAL | 0 refills | Status: DC

## 2020-04-27 MED ORDER — OMEPRAZOLE 20 MG PO CPDR: 20.0000 mg | DELAYED_RELEASE_CAPSULE | Freq: Every day | ORAL | 0 refills | Status: DC

## 2020-04-27 MED ORDER — LUTEIN-ZEAXANTHIN 25-5 MG PO CAPS: 1.0000 | ORAL_CAPSULE | Freq: Every day | ORAL | 0 refills | Status: DC

## 2020-04-27 MED ORDER — FLUOXETINE HCL 20 MG PO TABS: 20.0000 mg | ORAL_TABLET | Freq: Every day | ORAL | 0 refills | Status: DC

## 2020-04-27 MED ORDER — GLUCERNA PO LIQD: 237.0000 mL | Freq: Two times a day (BID) | ORAL | 0 refills | Status: DC

## 2020-04-27 MED ORDER — ALBUTEROL SULFATE HFA 108 (90 BASE) MCG/ACT IN AERS: 2.0000 | INHALATION_SPRAY | Freq: Four times a day (QID) | RESPIRATORY_TRACT | 0 refills | Status: DC | PRN

## 2020-04-27 MED ORDER — FAMOTIDINE 20 MG PO TABS: 20.0000 mg | ORAL_TABLET | Freq: Every day | ORAL | 0 refills | Status: DC

## 2020-04-27 MED ORDER — CETIRIZINE HCL 10 MG PO TABS: 10.0000 mg | ORAL_TABLET | Freq: Every day | ORAL | 0 refills | Status: DC

## 2020-04-27 MED ORDER — BUDESONIDE-FORMOTEROL FUMARATE 160-4.5 MCG/ACT IN AERO: 2.0000 | INHALATION_SPRAY | Freq: Two times a day (BID) | RESPIRATORY_TRACT | 0 refills | Status: DC

## 2020-04-27 MED ORDER — CYCLOSPORINE MODIFIED (NEORAL) 100 MG PO CAPS: 100.0000 mg | ORAL_CAPSULE | Freq: Two times a day (BID) | ORAL | 0 refills | Status: DC

## 2020-04-27 MED ORDER — FLUTICASONE PROPIONATE 50 MCG/ACT NA SUSP: 1.0000 | Freq: Every day | NASAL | 0 refills | Status: DC | PRN

## 2020-04-27 MED ORDER — APIXABAN 2.5 MG PO TABS: 2.5000 mg | ORAL_TABLET | Freq: Two times a day (BID) | ORAL | 0 refills | Status: DC

## 2020-04-27 MED ORDER — BENADRYL ITCH STOPPING 2 % EX GEL: CUTANEOUS | 0 refills | Status: DC

## 2020-04-27 MED ORDER — DOCUSATE SODIUM 100 MG PO CAPS: 100.0000 mg | ORAL_CAPSULE | Freq: Two times a day (BID) | ORAL | 0 refills | Status: DC

## 2020-04-27 MED ORDER — PREDNISONE 5 MG PO TABS: 5.0000 mg | ORAL_TABLET | Freq: Every day | ORAL | 0 refills | Status: DC

## 2020-04-27 MED ORDER — FERROUS SULFATE 325 (65 FE) MG PO TABS: 325.0000 mg | ORAL_TABLET | Freq: Every day | ORAL | 0 refills | Status: DC

## 2020-04-27 MED ORDER — POLYETHYLENE GLYCOL 3350 17 G PO PACK: 17.0000 g | PACK | Freq: Every day | ORAL | 0 refills | Status: DC | PRN

## 2020-04-27 MED ORDER — MIRABEGRON ER 50 MG PO TB24: 50.0000 mg | ORAL_TABLET | Freq: Every evening | ORAL | 0 refills | Status: DC

## 2020-04-27 NOTE — Progress Notes (Signed)
Location:  [facility] Room Number: 105-P Place of Service:  SNF (31)  Provider: Marlowe Sax FNP-C   PCP: [name], MD Patient Care Team: [name], MD as PCP - General (Internal Medicine)  Extended Emergency Contact Information Primary Emergency Contact: Karna Christmas Address: [address]          [place] of [place] Phone: [phone] Mobile Phone: [phone] Relation: Spouse Secondary Emergency Contact: Franki Cabot, [place] of [place] Mobile Phone: [phone] Relation: Son  Code Status: DNR Goals of care:  Advanced Directive information Advanced Directives 04/27/2020  Does Patient Have a Medical Advance Directive? Yes  Type of Advance Directive Out of facility DNR (pink MOST or yellow form)  Does patient want to make changes to medical advance directive? No - Patient declined  Copy of Jal in Chart? -  Would patient like information on creating a medical advance directive? -  Pre-existing out of facility DNR order (yellow form or pink MOST form) Pink MOST/Yellow Form most recent copy in chart - Physician notified to receive inpatient order     Allergies  Allergen Reactions  . Macrolides And Ketolides Nausea Only  . Aricept [Donepezil Hcl] Nausea And Vomiting  . Codeine Nausea And Vomiting  . Morphine Nausea And Vomiting  . Penicillin G Nausea Only  . Penicillins Nausea And Vomiting and Other (See Comments)    HEADACHE Has patient had a PCN reaction causing immediate rash, facial/tongue/throat swelling, SOB or lightheadedness with hypotension: unkn Has patient had a PCN reaction causing severe rash involving mucus membranes or skin necrosis: unkn Has patient had a PCN reaction that required hospitalization: unkn Has patient had a PCN reaction occurring within the last 10 years: unkn If all of the above answers are "NO", then may proceed with  Cephalosporin use.   . Statins Other (See Comments)    Doesn't remember  . Streptomycin Other (See Comments)    headache   . Tetracycline Nausea Only  . Tetracyclines & Related Rash    Chief Complaint  Patient presents with  . Discharge Note    Discharge from SNF with Husband.    HPI:  86 y.o. female seen today at [facility] for discharge home with Husband.she has a medical history of Hypertension,chronic diastolic CHF,CAD,Hx of DVT on eliquis,Kidney transplant in 2009,Prediabetes,Hypothyroidism,mild COPD,Depression among other conditions. She was here for short term rehabilitation for post hospital admission 04/11/2020 - 04/13/2020 for weakness,body aches and arm pain.she was treated with 7 day course of prednisone per Rheumatologist for synovitis of both wrists and right elbow for possible gout flare.then follow up outpatient with Rheumatologist.Her arm pain has improved except tremor on her right hand.she had lost weight but has gained 4.8 lbs over six days. No signs of fluid overload. She has worked well with PT/OT now stable for discharge home.She will be discharged home with Home health PT/OT to continue with ROM, Exercise, Gait stability and muscle strengthening. She has own Front wheel walker no DME required. Home health services will be arranged by facility social worker prior to discharge. Prescription medication will be written x 1 month then patient to follow up with PCP in 1-2 weeks.Script send to [facility] at [place] Tele# [phone] as requested by POA.She denies any acute issues this visit. Facility staff report no new concerns.     Past  Medical History:  Diagnosis Date  . Abnormal gait   . Anxiety   . Arthritis   . Asthma   . COPD, mild ([place])   . Coronary artery disease    pt denies  . Depression   . Diverticulitis   . Dizziness   . DVT (deep venous thrombosis) ([place])   . GERD (gastroesophageal reflux disease)   .  Hypertension   . Hypothyroidism   . Multiple allergies   . Pneumonia   . Pre-diabetes   . Reflux   . Renal disorder    had kidney transplant in 2009    Past Surgical History:  Procedure Laterality Date  . ANTERIOR APPROACH HEMI HIP ARTHROPLASTY Right 02/10/2016   Procedure: TOTAL HIP ARTHROPLASTY ANTERIOR APPROACH;  Surgeon: Rod Can, MD;  Location: [facility];  Service: Orthopedics;  Laterality: Right;  . CHOLECYSTECTOMY N/A 07/01/2018   Procedure: LAPAROSCOPIC CHOLECYSTECTOMY WITH INTRAOPERATIVE CHOLANGIOGRAM;  Surgeon: Jovita Kussmaul, MD;  Location: [facility];  Service: General;  Laterality: N/A;  . COLONOSCOPY    . NEPHRECTOMY TRANSPLANTED ORGAN    . TUMOR REMOVAL     Brain       reports that she has never smoked. She has never used smokeless tobacco. She reports that she does not drink alcohol and does not use drugs. Social History   Socioeconomic History  . Marital status: Married    Spouse name: [name]  . Number of children: 2  . Years of education: college  . Highest education level: Not on file  Occupational History  . Occupation: retired  Tobacco Use  . Smoking status: Never Smoker  . Smokeless tobacco: Never Used  Vaping Use  . Vaping Use: Never used  Substance and Sexual Activity  . Alcohol use: No  . Drug use: No  . Sexual activity: Not on file  Other Topics Concern  . Not on file  Social History Narrative   Patient lives at home with her husband [name]). Patient is retired. Patient has two children. Patient has college education.   Right handed.   Caffeine- None- Tea during day one cup   Social Determinants of Health   Financial Resource Strain:   . Difficulty of Paying Living Expenses:   Food Insecurity:   . Worried About Charity fundraiser in the Last Year:   . Arboriculturist in the Last Year:   Transportation Needs:   . Film/video editor (Medical):   Marland Kitchen Lack of Transportation (Non-Medical):   Physical Activity:   . Days of Exercise per  Week:   . Minutes of Exercise per Session:   Stress:   . Feeling of Stress :   Social Connections:   . Frequency of Communication with Friends and Family:   . Frequency of Social Gatherings with Friends and Family:   . Attends Religious Services:   . Active Member of Clubs or Organizations:   . Attends Archivist Meetings:   Marland Kitchen Marital Status:   Intimate Partner Violence:   . Fear of Current or Ex-Partner:   . Emotionally Abused:   Marland Kitchen Physically Abused:   . Sexually Abused:     Allergies  Allergen Reactions  . Macrolides And Ketolides Nausea Only  . Aricept [Donepezil Hcl] Nausea And Vomiting  . Codeine Nausea And Vomiting  . Morphine Nausea And Vomiting  . Penicillin G Nausea Only  . Penicillins Nausea And Vomiting and Other (See Comments)    HEADACHE Has patient had a PCN  reaction causing immediate rash, facial/tongue/throat swelling, SOB or lightheadedness with hypotension: unkn Has patient had a PCN reaction causing severe rash involving mucus membranes or skin necrosis: unkn Has patient had a PCN reaction that required hospitalization: unkn Has patient had a PCN reaction occurring within the last 10 years: unkn If all of the above answers are "NO", then may proceed with Cephalosporin use.   . Statins Other (See Comments)    Doesn't remember  . Streptomycin Other (See Comments)    headache   . Tetracycline Nausea Only  . Tetracyclines & Related Rash    Pertinent  Health Maintenance Due  Topic Date Due  . FOOT EXAM  Never done  . OPHTHALMOLOGY EXAM  Never done  . URINE MICROALBUMIN  Never done  . PNA vac Low Risk Adult (1 of 2 - PCV13) Never done  . HEMOGLOBIN A1C  12/30/2018  . INFLUENZA VACCINE  05/15/2020  . DEXA SCAN  Completed    Medications: Outpatient Encounter Medications as of 04/27/2020  Medication Sig  . acetaminophen (TYLENOL) 325 MG tablet Take 2 tablets (650 mg total) by mouth every 6 (six) hours as needed.  Marland Kitchen albuterol (VENTOLIN HFA)  108 (90 Base) MCG/ACT inhaler Inhale 2 puffs into the lungs every 6 (six) hours as needed for wheezing or shortness of breath.  Marland Kitchen apixaban (ELIQUIS) 2.5 MG TABS tablet Take 1 tablet (2.5 mg total) by mouth 2 (two) times daily.  Marland Kitchen azaTHIOprine (IMURAN) 50 MG tablet Take 1 tablet (50 mg total) by mouth 2 (two) times daily.  Marland Kitchen b complex vitamins tablet Take 1 tablet by mouth daily.  . bisacodyl (DULCOLAX) 5 MG EC tablet Take 1 tablet (5 mg total) by mouth daily as needed for moderate constipation.  . budesonide-formoterol (SYMBICORT) 160-4.5 MCG/ACT inhaler Inhale 2 puffs into the lungs in the morning and at bedtime.  . cetirizine (ZYRTEC) 10 MG tablet Take 1 tablet (10 mg total) by mouth daily.  . Cholecalciferol (VITAMIN D) 50 MCG (2000 UT) tablet Take 1 tablet (2,000 Units total) by mouth every evening.  . cycloSPORINE modified (NEORAL) 100 MG capsule Take 1 capsule (100 mg total) by mouth 2 (two) times daily.  Marland Kitchen DIPHENHYDRAMINE HCL, TOPICAL, (BENADRYL ITCH STOPPING) 2 % GEL Apply 1 application topically daily as needed for itch relief  . docusate sodium (COLACE) 100 MG capsule Take 1 capsule (100 mg total) by mouth 2 (two) times daily.  . famotidine (PEPCID) 20 MG tablet Take 1 tablet (20 mg total) by mouth daily with supper.  . ferrous sulfate 325 (65 FE) MG tablet Take 1 tablet (325 mg total) by mouth daily with breakfast.  . fluticasone (FLONASE) 50 MCG/ACT nasal spray Place 1 spray into both nostrils daily as needed for allergies or rhinitis.  . Glucerna (GLUCERNA) LIQD Take 237 mLs by mouth 2 (two) times daily.  Marland Kitchen levothyroxine (SYNTHROID) 75 MCG tablet Take 1 tablet (75 mcg total) by mouth daily.  . Lutein-Zeaxanthin 25-5 MG CAPS Take 1 capsule by mouth daily.  . metoCLOPramide (REGLAN) 5 MG tablet Take 1 tablet (5 mg total) by mouth 4 (four) times daily.  . mirabegron ER (MYRBETRIQ) 50 MG TB24 tablet Take 1 tablet (50 mg total) by mouth every evening.  . mirtazapine (REMERON) 15 MG tablet  Take 0.5 tablets (7.5 mg total) by mouth at bedtime.  . Multiple Vitamin (MULTIVITAMIN WITH MINERALS) TABS tablet Take 1 tablet by mouth daily.  Marland Kitchen omeprazole (PRILOSEC) 20 MG capsule Take 1 capsule (20 mg  total) by mouth daily.  . ondansetron (ZOFRAN) 4 MG tablet Take 1 tablet (4 mg total) by mouth every 4 (four) hours as needed.  Marland Kitchen oxybutynin (DITROPAN-XL) 10 MG 24 hr tablet Take 1 tablet (10 mg total) by mouth daily.  . phenylephrine-shark liver oil-mineral oil-petrolatum (PREPARATION H) 0.25-3-14-71.9 % rectal ointment Place 1 application rectally 2 (two) times daily as needed for hemorrhoids.  . polyethylene glycol (MIRALAX / GLYCOLAX) 17 g packet Take 17 g by mouth daily as needed for mild constipation.  . pravastatin (PRAVACHOL) 40 MG tablet Take 1 tablet (40 mg total) by mouth daily.  . predniSONE (DELTASONE) 5 MG tablet Take 1 tablet (5 mg total) by mouth daily with breakfast.  . sulfamethoxazole-trimethoprim (BACTRIM) 400-80 MG tablet Take 1 tablet by mouth every Monday, Wednesday, and Friday.  . [DISCONTINUED] acetaminophen (TYLENOL) 325 MG tablet Take 650 mg by mouth every 6 (six) hours as needed.  . [DISCONTINUED] albuterol (VENTOLIN HFA) 108 (90 Base) MCG/ACT inhaler Inhale 2 puffs into the lungs every 6 (six) hours as needed for wheezing or shortness of breath.  . [DISCONTINUED] apixaban (ELIQUIS) 2.5 MG TABS tablet Take 1 tablet (2.5 mg total) by mouth 2 (two) times daily.  . [DISCONTINUED] azaTHIOprine (IMURAN) 50 MG tablet Take 1 tablet (50 mg total) by mouth 2 (two) times daily.  . [DISCONTINUED] b complex vitamins tablet Take 1 tablet by mouth daily.  . [DISCONTINUED] bisacodyl (DULCOLAX) 5 MG EC tablet Take 1 tablet (5 mg total) by mouth daily as needed for moderate constipation.  . [DISCONTINUED] budesonide-formoterol (SYMBICORT) 160-4.5 MCG/ACT inhaler Inhale 2 puffs into the lungs in the morning and at bedtime.  . [DISCONTINUED] cetirizine (ZYRTEC) 10 MG tablet Take 1 tablet  (10 mg total) by mouth daily.  . [DISCONTINUED] Cholecalciferol (VITAMIN D) 2000 UNITS tablet Take 2,000 Units by mouth every evening.   . [DISCONTINUED] cycloSPORINE modified (NEORAL) 100 MG capsule Take 1 capsule (100 mg total) by mouth 2 (two) times daily.  . [DISCONTINUED] DIPHENHYDRAMINE HCL, TOPICAL, (BENADRYL ITCH STOPPING) 2 % GEL Apply 1 application topically daily as needed for itch relief  . [DISCONTINUED] docusate sodium (COLACE) 100 MG capsule Take 1 capsule (100 mg total) by mouth 2 (two) times daily.  . [DISCONTINUED] famotidine (PEPCID) 20 MG tablet Take 20 mg by mouth daily with supper.  . [DISCONTINUED] ferrous sulfate 325 (65 FE) MG tablet Take 325 mg by mouth daily with breakfast.  . [DISCONTINUED] fluticasone (FLONASE) 50 MCG/ACT nasal spray Place 1 spray into both nostrils daily as needed for allergies or rhinitis.  . [DISCONTINUED] Glucerna (GLUCERNA) LIQD Take 237 mLs by mouth 2 (two) times daily.  . [DISCONTINUED] levothyroxine (SYNTHROID) 75 MCG tablet Take 1 tablet (75 mcg total) by mouth daily.  . [DISCONTINUED] Lutein-Zeaxanthin 25-5 MG CAPS Take 1 capsule by mouth daily.  . [DISCONTINUED] metoCLOPramide (REGLAN) 5 MG tablet Take 5 mg by mouth 4 (four) times daily.  . [DISCONTINUED] mirabegron ER (MYRBETRIQ) 50 MG TB24 tablet Take 1 tablet (50 mg total) by mouth every evening.  . [DISCONTINUED] mirtazapine (REMERON) 15 MG tablet Take 7.5 mg by mouth at bedtime.  . [DISCONTINUED] Multiple Vitamin (MULTIVITAMIN WITH MINERALS) TABS tablet Take 1 tablet by mouth daily.  . [DISCONTINUED] omeprazole (PRILOSEC) 20 MG capsule Take 20 mg by mouth daily.  . [DISCONTINUED] ondansetron (ZOFRAN) 4 MG tablet Take 4 mg by mouth every 4 (four) hours as needed for nausea/vomiting.  . [DISCONTINUED] oxybutynin (DITROPAN-XL) 10 MG 24 hr tablet Take 1 tablet (  10 mg total) by mouth daily.  . [DISCONTINUED] phenylephrine-shark liver oil-mineral oil-petrolatum (PREPARATION H) 0.25-3-14-71.9 %  rectal ointment Place 1 application rectally 2 (two) times daily as needed for hemorrhoids.  . [DISCONTINUED] polyethylene glycol (MIRALAX / GLYCOLAX) 17 g packet Take 17 g by mouth daily as needed for mild constipation.  . [DISCONTINUED] pravastatin (PRAVACHOL) 40 MG tablet Take 40 mg by mouth daily.  . [DISCONTINUED] predniSONE (DELTASONE) 5 MG tablet Take 1 tablet (5 mg total) by mouth daily with breakfast.  . [DISCONTINUED] sulfamethoxazole-trimethoprim (BACTRIM) 400-80 MG tablet Take 1 tablet by mouth every Monday, Wednesday, and Friday.  Marland Kitchen FLUoxetine (PROZAC) 20 MG tablet Take 1 tablet (20 mg total) by mouth daily.  . [DISCONTINUED] FLUoxetine (PROZAC) 20 MG tablet Take 1 tablet (20 mg total) by mouth daily.   No facility-administered encounter medications on file as of 04/27/2020.     Review of Systems  Constitutional: Negative for appetite change, chills, fatigue and fever.  HENT: Negative for congestion, postnasal drip, rhinorrhea, sinus pressure, sinus pain and sore throat.   Eyes: Negative for discharge, redness and itching.  Respiratory: Negative for cough, chest tightness, shortness of breath and wheezing.   Cardiovascular: Negative for chest pain, palpitations and leg swelling.  Gastrointestinal: Negative for abdominal distention, abdominal pain, constipation, diarrhea, nausea and vomiting.  Endocrine: Negative for cold intolerance, heat intolerance, polydipsia, polyphagia and polyuria.  Genitourinary: Negative for difficulty urinating, dysuria, flank pain, frequency and urgency.  Musculoskeletal: Positive for gait problem. Negative for joint swelling, myalgias and neck pain.  Skin: Negative for color change, pallor, rash and wound.  Neurological: Negative for dizziness, speech difficulty, weakness, light-headedness, numbness and headaches.       Chronic right hand tremors   Hematological: Does not bruise/bleed easily.  Psychiatric/Behavioral: Negative for agitation, confusion  and sleep disturbance. The patient is not nervous/anxious.     Vitals:   04/27/20 0912  BP: (!) 144/81  Pulse: 77  Resp: 17  Temp: 97.9 F (36.6 C)  SpO2: 98%  Weight: 113 lb (51.3 kg)  Height: 5\' 5"  (1.651 m)   Body mass index is 18.8 kg/m. Physical Exam Vitals reviewed.  Constitutional:      General: She is not in acute distress.    Appearance: She is underweight. She is not ill-appearing.  HENT:     Head: Normocephalic.     Nose: Nose normal. No congestion or rhinorrhea.     Mouth/Throat:     Mouth: Mucous membranes are moist.     Pharynx: No oropharyngeal exudate or posterior oropharyngeal erythema.     Comments: edentulous upper gum  Eyes:     General: No scleral icterus.       Right eye: No discharge.        Left eye: No discharge.     Conjunctiva/sclera: Conjunctivae normal.     Pupils: Pupils are equal, round, and reactive to light.  Neck:     Vascular: No carotid bruit.  Cardiovascular:     Rate and Rhythm: Normal rate and regular rhythm.     Pulses: Normal pulses.     Heart sounds: Normal heart sounds.  Pulmonary:     Effort: Pulmonary effort is normal. No respiratory distress.     Breath sounds: Normal breath sounds. No wheezing, rhonchi or rales.  Chest:     Chest wall: No tenderness.  Abdominal:     General: Bowel sounds are normal. There is no distension.     Palpations: Abdomen is soft.  There is no mass.     Tenderness: There is no abdominal tenderness. There is no right CVA tenderness, left CVA tenderness, guarding or rebound.  Musculoskeletal:        General: No swelling or tenderness.     Cervical back: Normal range of motion. No rigidity or tenderness.     Right lower leg: No edema.     Left lower leg: No edema.     Comments: Unsteady gait moves x 4 extremities  Lymphadenopathy:     Cervical: No cervical adenopathy.  Skin:    General: Skin is warm.     Coloration: Skin is not pale.     Findings: No bruising, erythema or rash.    Neurological:     Mental Status: She is alert and oriented to person, place, and time.     Cranial Nerves: No cranial nerve deficit.     Motor: Weakness present.     Gait: Gait abnormal.     Comments: Right hand chronic tremors   Psychiatric:        Mood and Affect: Affect is flat.        Speech: Speech normal.        Behavior: Behavior normal.        Thought Content: Thought content normal.    Labs reviewed: Basic Metabolic Panel: Recent Labs    04/11/20 1033 04/11/20 1033 04/12/20 0255 04/12/20 0255 04/13/20 0903 04/19/20 0000 04/26/20 0000  NA 141   < > 139   < > 138 143 142  K 3.0*   < > 3.0*   < > 3.7 3.6 4.3  CL 107   < > 109   < > 110 108 110*  CO2 19*   < > 21*   < > 20* 23* 22  GLUCOSE 165*  --  216*  --  125*  --   --   BUN 17   < > 23   < > 32* 28* 22*  CREATININE 1.83*   < > 1.67*   < > 2.15* 1.4* 1.4*  CALCIUM 9.1   < > 8.8*   < > 8.9 9.6 8.8   < > = values in this interval not displayed.   Liver Function Tests: Recent Labs    12/30/19 0758 04/11/20 1033  AST 24 20  ALT 13 9  ALKPHOS 63 67  BILITOT 1.0 1.2  PROT 6.9 6.4*  ALBUMIN 3.2* 2.8*   CBC: Recent Labs    12/31/19 0411 12/31/19 0411 04/11/20 1033 04/11/20 1033 04/12/20 0255 04/19/20 0000 04/26/20 0000  WBC 7.0   < > 10.7*   < > 8.1 7.9 7.0  NEUTROABS  --   --  7.8*  --   --   --   --   HGB 10.0*   < > 12.2   < > 10.9* 9.6* 8.8*  HCT 30.2*   < > 37.1   < > 32.2* 29* 26*  MCV 100.3*  --  97.6  --  95.8  --   --   PLT 159   < > 224   < > 174 264 237   < > = values in this interval not displayed.   Cardiac Enzymes: Recent Labs    12/29/19 2223 04/11/20 1130  CKTOTAL 49 38   Procedures and Imaging Studies During Stay: DG Chest 2 View  Result Date: 04/11/2020 CLINICAL DATA:  Fever, chills and body aches. EXAM: CHEST - 2 VIEW COMPARISON:  12/30/2019  FINDINGS: The cardiac silhouette, mediastinal and hilar contours are within normal limits and stable. Stable mild tortuosity and  calcification of the thoracic aorta. Stable calcified granuloma in the left upper lobe. No acute pulmonary findings. No worrisome pulmonary lesions. The bony thorax is intact. Remote healed rib fractures are noted. IMPRESSION: No acute cardiopulmonary findings. Electronically Signed   By: Marijo Sanes M.D.   On: 04/11/2020 10:58   CT CERVICAL SPINE WO CONTRAST  Result Date: 04/11/2020 CLINICAL DATA:  Cervical osteoarthritis EXAM: CT CERVICAL SPINE WITHOUT CONTRAST TECHNIQUE: Multidetector CT imaging of the cervical spine was performed without intravenous contrast. Multiplanar CT image reconstructions were also generated. COMPARISON:  MRI 03/11/2013 FINDINGS: Alignment: No subluxation. Skull base and vertebrae: No acute fracture. No primary bone lesion or focal pathologic process. Prior left occipital craniectomy. Soft tissues and spinal canal: No prevertebral fluid or swelling. No visible canal hematoma. Disc levels: Diffuse degenerative disc disease with disc space narrowing and spurring, most pronounced at C5-6. Diffuse bilateral degenerative facet disease, moderate to advanced. Upper chest: Biapical scarring.  No acute findings. Other: Carotid artery calcifications. IMPRESSION: Diffuse mild degenerative disc disease. Diffuse moderate to advanced degenerative facet disease. No acute bony abnormality. Electronically Signed   By: Rolm Baptise M.D.   On: 04/11/2020 18:04    Assessment/Plan:     1. Unsteady gait  Has worked well with PT/ OT.Will discharge home PT/OT to continue with ROM, Exercise, Gait stability and muscle strengthening. No DME required has own FWW  to allow her to maintain current level of independence.Fall and safety precautions.    2. Generalized weakness Has improved post hospital admission for PMR,synovitis and FTT. - Discharge with [facility] PT/OT for ROM, Exercise, Gait stability and muscle strengthening.  3. Failure to thrive in adult Had previous weight loss but has  gained 4.8 lbs in the past 6 days. Continue on Remeron and protein supplement.  - Needs arrangement for upper dentures to improve her eating. - Glucerna (GLUCERNA) LIQD; Take 237 mLs by mouth 2 (two) times daily.  Dispense: 237 mL; Refill: 0 - mirtazapine (REMERON) 15 MG tablet; Take 0.5 tablets (7.5 mg total) by mouth at bedtime.  Dispense: 30 tablet; Refill: 0 - CMP in 1-2 weeks with PCP   4. Essential hypertension B/p stable.continue on heart healthy diet.  5. Hypothyroidism due to acquired atrophy of thyroid No latest TSH level for review.Will defer to PCP  - levothyroxine (SYNTHROID) 75 MCG tablet; Take 1 tablet (75 mcg total) by mouth daily.  Dispense: 30 tablet; Refill: 0  6. PMR (polymyalgia rheumatica) (HCC) S/p hospitalization completed prednisone 7 days course stress dose then discharged on her home dose 5 mg tablet daily with breakfast. - acetaminophen (TYLENOL) 325 MG tablet; Take 2 tablets (650 mg total) by mouth every 6 (six) hours as needed.  Dispense: 30 tablet; Refill: 0 - predniSONE (DELTASONE) 5 MG tablet; Take 1 tablet (5 mg total) by mouth daily with breakfast.  Dispense: 30 tablet; Refill: 0  7. Chronic kidney disease, stage 4, severely decreased GFR (HCC) CR 1.42 was 2.15 during hospitalization progressive decline not a HD candidate.Palliative care recommended. - continue to avoid nephrotoxins and dose all other medication for renal clearance.  8. Hx of kidney transplant - Kidney transplant in 2009 Continue on Imuran,cyclosporine and bactrim for prophylaxis.Renal function decline as above.  - azaTHIOprine (IMURAN) 50 MG tablet; Take 1 tablet (50 mg total) by mouth 2 (two) times daily.  Dispense: 60 tablet; Refill: 0 -  cycloSPORINE modified (NEORAL) 100 MG capsule; Take 1 capsule (100 mg total) by mouth 2 (two) times daily.  Dispense: 60 capsule; Refill: 0 - sulfamethoxazole-trimethoprim (BACTRIM) 400-80 MG tablet; Take 1 tablet by mouth every Monday, Wednesday, and  Friday.  Dispense: 12 tablet; Refill: 0  9. Anemia in stage 4 chronic kidney disease (HCC) Latest Hgb 8.8  - continue on Ferrous sulfates - ferrous sulfate 325 (65 FE) MG tablet; Take 1 tablet (325 mg total) by mouth daily with breakfast.  Dispense: 30 tablet; Refill: 0 CBC in 1-2 weeks with PCP   10. Depression, unspecified depression type Mood stable.Continue on Prozac  - FLUoxetine (PROZAC) 20 MG tablet; Take 1 tablet (20 mg total) by mouth daily.  Dispense: 30 tablet; Refill: 0  11. Chronic deep vein thrombosis (DVT) of proximal vein of left lower extremity ([place]) Xarelto discontinued during hospital admission due to declining renal function.EliQuis was started instead.  - apixaban (ELIQUIS) 2.5 MG TABS tablet; Take 1 tablet (2.5 mg total) by mouth 2 (two) times daily.  Dispense: 60 tablet; Refill: 0  12. Weight loss Previous weight loss but has gained 4.8 lbs over 6 days. - continue on Remeron and protein supplement - Glucerna (GLUCERNA) LIQD; Take 237 mLs by mouth 2 (two) times daily.  Dispense: 237 mL; Refill: 0 - mirtazapine (REMERON) 15 MG tablet; Take 0.5 tablets (7.5 mg total) by mouth at bedtime.  Dispense: 30 tablet; Refill: 0  13. Gastroesophageal reflux disease without esophagitis Symptoms controlled.continue on Famotidine ad Omeprazole. - famotidine (PEPCID) 20 MG tablet; Take 1 tablet (20 mg total) by mouth daily with supper.  Dispense: 30 tablet; Refill: 0 - omeprazole (PRILOSEC) 20 MG capsule; Take 1 capsule (20 mg total) by mouth daily.  Dispense: 30 capsule; Refill: 0  14. Slow transit constipation - encouraged oral intake and hydration. - docusate sodium (COLACE) 100 MG capsule; Take 1 capsule (100 mg total) by mouth 2 (two) times daily.  Dispense: 10 capsule; Refill: 0 - polyethylene glycol (MIRALAX / GLYCOLAX) 17 g packet; Take 17 g by mouth daily as needed for mild constipation.  Dispense: 14 each; Refill: 0  15. Chronic bronchitis, unspecified chronic  bronchitis type (HCC) Breathing is stable.continue on Albuterol and Symbicort.  - albuterol (VENTOLIN HFA) 108 (90 Base) MCG/ACT inhaler; Inhale 2 puffs into the lungs every 6 (six) hours as needed for wheezing or shortness of breath.  Dispense: 6.7 g; Refill: 0 - budesonide-formoterol (SYMBICORT) 160-4.5 MCG/ACT inhaler; Inhale 2 puffs into the lungs in the morning and at bedtime.  Dispense: 1 Inhaler; Refill: 0  16. Mixed hyperlipidemia No latest LDL for review.will defer to PCP  - Continue on Pravastatin and dietary modification.  - pravastatin (PRAVACHOL) 40 MG tablet; Take 1 tablet (40 mg total) by mouth daily.  Dispense: 30 tablet; Refill: 0  Patient is being discharged with the following home health services:   -[facility] for ROM, exercise, gait stability and muscle strengthening -  [facility] for ADL's assist   Patient is being discharged with the following durable medical equipment:    No DME required has own FWW  to allow her to maintain current level of independence.  Patient has been advised to f/u with their PCP in 1-2 weeks to for a transitions of care visit.Social services at their facility was responsible for arranging this appointment.  Pt was provided with adequate prescriptions of noncontrolled medications to reach the scheduled appointment.For controlled substances, a limited supply  was provided as appropriate for the individual patient. If the pt normally receives these medications from a pain clinic or has a contract with another physician, these medications should be received from that clinic or physician only).    Future labs/tests needed:  CBC, BMP in 1-2 weeks PCP

## 2020-05-02 DIAGNOSIS — R5381 Other malaise: Secondary | ICD-10-CM | POA: Diagnosis not present

## 2020-05-02 DIAGNOSIS — N184 Chronic kidney disease, stage 4 (severe): Secondary | ICD-10-CM | POA: Diagnosis not present

## 2020-05-02 DIAGNOSIS — L989 Disorder of the skin and subcutaneous tissue, unspecified: Secondary | ICD-10-CM | POA: Diagnosis not present

## 2020-05-02 DIAGNOSIS — I129 Hypertensive chronic kidney disease with stage 1 through stage 4 chronic kidney disease, or unspecified chronic kidney disease: Secondary | ICD-10-CM | POA: Diagnosis not present

## 2020-05-03 DIAGNOSIS — E039 Hypothyroidism, unspecified: Secondary | ICD-10-CM | POA: Diagnosis not present

## 2020-05-03 DIAGNOSIS — I130 Hypertensive heart and chronic kidney disease with heart failure and stage 1 through stage 4 chronic kidney disease, or unspecified chronic kidney disease: Secondary | ICD-10-CM | POA: Diagnosis not present

## 2020-05-03 DIAGNOSIS — J45909 Unspecified asthma, uncomplicated: Secondary | ICD-10-CM | POA: Diagnosis not present

## 2020-05-03 DIAGNOSIS — N184 Chronic kidney disease, stage 4 (severe): Secondary | ICD-10-CM | POA: Diagnosis not present

## 2020-05-03 DIAGNOSIS — I5033 Acute on chronic diastolic (congestive) heart failure: Secondary | ICD-10-CM | POA: Diagnosis not present

## 2020-05-03 DIAGNOSIS — M1991 Primary osteoarthritis, unspecified site: Secondary | ICD-10-CM | POA: Diagnosis not present

## 2020-05-03 DIAGNOSIS — J438 Other emphysema: Secondary | ICD-10-CM | POA: Diagnosis not present

## 2020-05-03 DIAGNOSIS — D631 Anemia in chronic kidney disease: Secondary | ICD-10-CM | POA: Diagnosis not present

## 2020-05-03 DIAGNOSIS — I2510 Atherosclerotic heart disease of native coronary artery without angina pectoris: Secondary | ICD-10-CM | POA: Diagnosis not present

## 2020-05-04 DIAGNOSIS — I5033 Acute on chronic diastolic (congestive) heart failure: Secondary | ICD-10-CM | POA: Diagnosis not present

## 2020-05-04 DIAGNOSIS — D631 Anemia in chronic kidney disease: Secondary | ICD-10-CM | POA: Diagnosis not present

## 2020-05-04 DIAGNOSIS — I130 Hypertensive heart and chronic kidney disease with heart failure and stage 1 through stage 4 chronic kidney disease, or unspecified chronic kidney disease: Secondary | ICD-10-CM | POA: Diagnosis not present

## 2020-05-04 DIAGNOSIS — M1991 Primary osteoarthritis, unspecified site: Secondary | ICD-10-CM | POA: Diagnosis not present

## 2020-05-04 DIAGNOSIS — N184 Chronic kidney disease, stage 4 (severe): Secondary | ICD-10-CM | POA: Diagnosis not present

## 2020-05-04 DIAGNOSIS — J438 Other emphysema: Secondary | ICD-10-CM | POA: Diagnosis not present

## 2020-05-04 DIAGNOSIS — J45909 Unspecified asthma, uncomplicated: Secondary | ICD-10-CM | POA: Diagnosis not present

## 2020-05-04 DIAGNOSIS — E039 Hypothyroidism, unspecified: Secondary | ICD-10-CM | POA: Diagnosis not present

## 2020-05-04 DIAGNOSIS — I2510 Atherosclerotic heart disease of native coronary artery without angina pectoris: Secondary | ICD-10-CM | POA: Diagnosis not present

## 2020-05-06 DIAGNOSIS — I2510 Atherosclerotic heart disease of native coronary artery without angina pectoris: Secondary | ICD-10-CM | POA: Diagnosis not present

## 2020-05-06 DIAGNOSIS — I130 Hypertensive heart and chronic kidney disease with heart failure and stage 1 through stage 4 chronic kidney disease, or unspecified chronic kidney disease: Secondary | ICD-10-CM | POA: Diagnosis not present

## 2020-05-06 DIAGNOSIS — D631 Anemia in chronic kidney disease: Secondary | ICD-10-CM | POA: Diagnosis not present

## 2020-05-06 DIAGNOSIS — E039 Hypothyroidism, unspecified: Secondary | ICD-10-CM | POA: Diagnosis not present

## 2020-05-06 DIAGNOSIS — N184 Chronic kidney disease, stage 4 (severe): Secondary | ICD-10-CM | POA: Diagnosis not present

## 2020-05-06 DIAGNOSIS — I5033 Acute on chronic diastolic (congestive) heart failure: Secondary | ICD-10-CM | POA: Diagnosis not present

## 2020-05-06 DIAGNOSIS — J45909 Unspecified asthma, uncomplicated: Secondary | ICD-10-CM | POA: Diagnosis not present

## 2020-05-06 DIAGNOSIS — M1991 Primary osteoarthritis, unspecified site: Secondary | ICD-10-CM | POA: Diagnosis not present

## 2020-05-06 DIAGNOSIS — J438 Other emphysema: Secondary | ICD-10-CM | POA: Diagnosis not present

## 2020-05-09 DIAGNOSIS — M810 Age-related osteoporosis without current pathological fracture: Secondary | ICD-10-CM | POA: Diagnosis not present

## 2020-05-09 DIAGNOSIS — J449 Chronic obstructive pulmonary disease, unspecified: Secondary | ICD-10-CM | POA: Diagnosis not present

## 2020-05-09 DIAGNOSIS — H35033 Hypertensive retinopathy, bilateral: Secondary | ICD-10-CM | POA: Diagnosis not present

## 2020-05-09 DIAGNOSIS — N184 Chronic kidney disease, stage 4 (severe): Secondary | ICD-10-CM | POA: Diagnosis not present

## 2020-05-09 DIAGNOSIS — I129 Hypertensive chronic kidney disease with stage 1 through stage 4 chronic kidney disease, or unspecified chronic kidney disease: Secondary | ICD-10-CM | POA: Diagnosis not present

## 2020-05-09 DIAGNOSIS — E7800 Pure hypercholesterolemia, unspecified: Secondary | ICD-10-CM | POA: Diagnosis not present

## 2020-05-09 DIAGNOSIS — E039 Hypothyroidism, unspecified: Secondary | ICD-10-CM | POA: Diagnosis not present

## 2020-05-09 DIAGNOSIS — F325 Major depressive disorder, single episode, in full remission: Secondary | ICD-10-CM | POA: Diagnosis not present

## 2020-05-10 DIAGNOSIS — D631 Anemia in chronic kidney disease: Secondary | ICD-10-CM | POA: Diagnosis not present

## 2020-05-10 DIAGNOSIS — M1991 Primary osteoarthritis, unspecified site: Secondary | ICD-10-CM | POA: Diagnosis not present

## 2020-05-10 DIAGNOSIS — N184 Chronic kidney disease, stage 4 (severe): Secondary | ICD-10-CM | POA: Diagnosis not present

## 2020-05-10 DIAGNOSIS — I2510 Atherosclerotic heart disease of native coronary artery without angina pectoris: Secondary | ICD-10-CM | POA: Diagnosis not present

## 2020-05-10 DIAGNOSIS — I5033 Acute on chronic diastolic (congestive) heart failure: Secondary | ICD-10-CM | POA: Diagnosis not present

## 2020-05-10 DIAGNOSIS — J438 Other emphysema: Secondary | ICD-10-CM | POA: Diagnosis not present

## 2020-05-10 DIAGNOSIS — E039 Hypothyroidism, unspecified: Secondary | ICD-10-CM | POA: Diagnosis not present

## 2020-05-10 DIAGNOSIS — J45909 Unspecified asthma, uncomplicated: Secondary | ICD-10-CM | POA: Diagnosis not present

## 2020-05-10 DIAGNOSIS — I130 Hypertensive heart and chronic kidney disease with heart failure and stage 1 through stage 4 chronic kidney disease, or unspecified chronic kidney disease: Secondary | ICD-10-CM | POA: Diagnosis not present

## 2020-05-12 DIAGNOSIS — N184 Chronic kidney disease, stage 4 (severe): Secondary | ICD-10-CM | POA: Diagnosis not present

## 2020-05-12 DIAGNOSIS — E039 Hypothyroidism, unspecified: Secondary | ICD-10-CM | POA: Diagnosis not present

## 2020-05-12 DIAGNOSIS — I2510 Atherosclerotic heart disease of native coronary artery without angina pectoris: Secondary | ICD-10-CM | POA: Diagnosis not present

## 2020-05-12 DIAGNOSIS — J438 Other emphysema: Secondary | ICD-10-CM | POA: Diagnosis not present

## 2020-05-12 DIAGNOSIS — M1991 Primary osteoarthritis, unspecified site: Secondary | ICD-10-CM | POA: Diagnosis not present

## 2020-05-12 DIAGNOSIS — J45909 Unspecified asthma, uncomplicated: Secondary | ICD-10-CM | POA: Diagnosis not present

## 2020-05-12 DIAGNOSIS — D631 Anemia in chronic kidney disease: Secondary | ICD-10-CM | POA: Diagnosis not present

## 2020-05-12 DIAGNOSIS — I130 Hypertensive heart and chronic kidney disease with heart failure and stage 1 through stage 4 chronic kidney disease, or unspecified chronic kidney disease: Secondary | ICD-10-CM | POA: Diagnosis not present

## 2020-05-12 DIAGNOSIS — I5033 Acute on chronic diastolic (congestive) heart failure: Secondary | ICD-10-CM | POA: Diagnosis not present

## 2020-05-13 DIAGNOSIS — M1991 Primary osteoarthritis, unspecified site: Secondary | ICD-10-CM | POA: Diagnosis not present

## 2020-05-13 DIAGNOSIS — I130 Hypertensive heart and chronic kidney disease with heart failure and stage 1 through stage 4 chronic kidney disease, or unspecified chronic kidney disease: Secondary | ICD-10-CM | POA: Diagnosis not present

## 2020-05-13 DIAGNOSIS — E039 Hypothyroidism, unspecified: Secondary | ICD-10-CM | POA: Diagnosis not present

## 2020-05-13 DIAGNOSIS — N184 Chronic kidney disease, stage 4 (severe): Secondary | ICD-10-CM | POA: Diagnosis not present

## 2020-05-13 DIAGNOSIS — I5033 Acute on chronic diastolic (congestive) heart failure: Secondary | ICD-10-CM | POA: Diagnosis not present

## 2020-05-13 DIAGNOSIS — I2510 Atherosclerotic heart disease of native coronary artery without angina pectoris: Secondary | ICD-10-CM | POA: Diagnosis not present

## 2020-05-13 DIAGNOSIS — J438 Other emphysema: Secondary | ICD-10-CM | POA: Diagnosis not present

## 2020-05-13 DIAGNOSIS — D631 Anemia in chronic kidney disease: Secondary | ICD-10-CM | POA: Diagnosis not present

## 2020-05-13 DIAGNOSIS — J45909 Unspecified asthma, uncomplicated: Secondary | ICD-10-CM | POA: Diagnosis not present

## 2020-05-16 DIAGNOSIS — I5033 Acute on chronic diastolic (congestive) heart failure: Secondary | ICD-10-CM | POA: Diagnosis not present

## 2020-05-16 DIAGNOSIS — M1991 Primary osteoarthritis, unspecified site: Secondary | ICD-10-CM | POA: Diagnosis not present

## 2020-05-16 DIAGNOSIS — J438 Other emphysema: Secondary | ICD-10-CM | POA: Diagnosis not present

## 2020-05-16 DIAGNOSIS — N184 Chronic kidney disease, stage 4 (severe): Secondary | ICD-10-CM | POA: Diagnosis not present

## 2020-05-16 DIAGNOSIS — I2510 Atherosclerotic heart disease of native coronary artery without angina pectoris: Secondary | ICD-10-CM | POA: Diagnosis not present

## 2020-05-16 DIAGNOSIS — I130 Hypertensive heart and chronic kidney disease with heart failure and stage 1 through stage 4 chronic kidney disease, or unspecified chronic kidney disease: Secondary | ICD-10-CM | POA: Diagnosis not present

## 2020-05-16 DIAGNOSIS — J45909 Unspecified asthma, uncomplicated: Secondary | ICD-10-CM | POA: Diagnosis not present

## 2020-05-16 DIAGNOSIS — D631 Anemia in chronic kidney disease: Secondary | ICD-10-CM | POA: Diagnosis not present

## 2020-05-16 DIAGNOSIS — E039 Hypothyroidism, unspecified: Secondary | ICD-10-CM | POA: Diagnosis not present

## 2020-05-17 DIAGNOSIS — M1991 Primary osteoarthritis, unspecified site: Secondary | ICD-10-CM | POA: Diagnosis not present

## 2020-05-17 DIAGNOSIS — J45909 Unspecified asthma, uncomplicated: Secondary | ICD-10-CM | POA: Diagnosis not present

## 2020-05-17 DIAGNOSIS — I2510 Atherosclerotic heart disease of native coronary artery without angina pectoris: Secondary | ICD-10-CM | POA: Diagnosis not present

## 2020-05-17 DIAGNOSIS — D631 Anemia in chronic kidney disease: Secondary | ICD-10-CM | POA: Diagnosis not present

## 2020-05-17 DIAGNOSIS — I5033 Acute on chronic diastolic (congestive) heart failure: Secondary | ICD-10-CM | POA: Diagnosis not present

## 2020-05-17 DIAGNOSIS — N184 Chronic kidney disease, stage 4 (severe): Secondary | ICD-10-CM | POA: Diagnosis not present

## 2020-05-17 DIAGNOSIS — E039 Hypothyroidism, unspecified: Secondary | ICD-10-CM | POA: Diagnosis not present

## 2020-05-17 DIAGNOSIS — J438 Other emphysema: Secondary | ICD-10-CM | POA: Diagnosis not present

## 2020-05-17 DIAGNOSIS — I130 Hypertensive heart and chronic kidney disease with heart failure and stage 1 through stage 4 chronic kidney disease, or unspecified chronic kidney disease: Secondary | ICD-10-CM | POA: Diagnosis not present

## 2020-05-18 DIAGNOSIS — I5033 Acute on chronic diastolic (congestive) heart failure: Secondary | ICD-10-CM | POA: Diagnosis not present

## 2020-05-18 DIAGNOSIS — J438 Other emphysema: Secondary | ICD-10-CM | POA: Diagnosis not present

## 2020-05-18 DIAGNOSIS — J45909 Unspecified asthma, uncomplicated: Secondary | ICD-10-CM | POA: Diagnosis not present

## 2020-05-18 DIAGNOSIS — I130 Hypertensive heart and chronic kidney disease with heart failure and stage 1 through stage 4 chronic kidney disease, or unspecified chronic kidney disease: Secondary | ICD-10-CM | POA: Diagnosis not present

## 2020-05-18 DIAGNOSIS — I2510 Atherosclerotic heart disease of native coronary artery without angina pectoris: Secondary | ICD-10-CM | POA: Diagnosis not present

## 2020-05-18 DIAGNOSIS — C44729 Squamous cell carcinoma of skin of left lower limb, including hip: Secondary | ICD-10-CM | POA: Diagnosis not present

## 2020-05-18 DIAGNOSIS — D485 Neoplasm of uncertain behavior of skin: Secondary | ICD-10-CM | POA: Diagnosis not present

## 2020-05-18 DIAGNOSIS — Z85828 Personal history of other malignant neoplasm of skin: Secondary | ICD-10-CM | POA: Diagnosis not present

## 2020-05-18 DIAGNOSIS — L820 Inflamed seborrheic keratosis: Secondary | ICD-10-CM | POA: Diagnosis not present

## 2020-05-18 DIAGNOSIS — D631 Anemia in chronic kidney disease: Secondary | ICD-10-CM | POA: Diagnosis not present

## 2020-05-18 DIAGNOSIS — M1991 Primary osteoarthritis, unspecified site: Secondary | ICD-10-CM | POA: Diagnosis not present

## 2020-05-18 DIAGNOSIS — N184 Chronic kidney disease, stage 4 (severe): Secondary | ICD-10-CM | POA: Diagnosis not present

## 2020-05-18 DIAGNOSIS — E039 Hypothyroidism, unspecified: Secondary | ICD-10-CM | POA: Diagnosis not present

## 2020-05-23 DIAGNOSIS — E039 Hypothyroidism, unspecified: Secondary | ICD-10-CM | POA: Diagnosis not present

## 2020-05-23 DIAGNOSIS — D631 Anemia in chronic kidney disease: Secondary | ICD-10-CM | POA: Diagnosis not present

## 2020-05-23 DIAGNOSIS — J45909 Unspecified asthma, uncomplicated: Secondary | ICD-10-CM | POA: Diagnosis not present

## 2020-05-23 DIAGNOSIS — J438 Other emphysema: Secondary | ICD-10-CM | POA: Diagnosis not present

## 2020-05-23 DIAGNOSIS — I130 Hypertensive heart and chronic kidney disease with heart failure and stage 1 through stage 4 chronic kidney disease, or unspecified chronic kidney disease: Secondary | ICD-10-CM | POA: Diagnosis not present

## 2020-05-23 DIAGNOSIS — I5033 Acute on chronic diastolic (congestive) heart failure: Secondary | ICD-10-CM | POA: Diagnosis not present

## 2020-05-23 DIAGNOSIS — N184 Chronic kidney disease, stage 4 (severe): Secondary | ICD-10-CM | POA: Diagnosis not present

## 2020-05-23 DIAGNOSIS — I2510 Atherosclerotic heart disease of native coronary artery without angina pectoris: Secondary | ICD-10-CM | POA: Diagnosis not present

## 2020-05-23 DIAGNOSIS — M1991 Primary osteoarthritis, unspecified site: Secondary | ICD-10-CM | POA: Diagnosis not present

## 2020-05-24 DIAGNOSIS — I5033 Acute on chronic diastolic (congestive) heart failure: Secondary | ICD-10-CM | POA: Diagnosis not present

## 2020-05-24 DIAGNOSIS — N184 Chronic kidney disease, stage 4 (severe): Secondary | ICD-10-CM | POA: Diagnosis not present

## 2020-05-24 DIAGNOSIS — D631 Anemia in chronic kidney disease: Secondary | ICD-10-CM | POA: Diagnosis not present

## 2020-05-24 DIAGNOSIS — J45909 Unspecified asthma, uncomplicated: Secondary | ICD-10-CM | POA: Diagnosis not present

## 2020-05-24 DIAGNOSIS — E039 Hypothyroidism, unspecified: Secondary | ICD-10-CM | POA: Diagnosis not present

## 2020-05-24 DIAGNOSIS — I2510 Atherosclerotic heart disease of native coronary artery without angina pectoris: Secondary | ICD-10-CM | POA: Diagnosis not present

## 2020-05-24 DIAGNOSIS — J438 Other emphysema: Secondary | ICD-10-CM | POA: Diagnosis not present

## 2020-05-24 DIAGNOSIS — M1991 Primary osteoarthritis, unspecified site: Secondary | ICD-10-CM | POA: Diagnosis not present

## 2020-05-24 DIAGNOSIS — I130 Hypertensive heart and chronic kidney disease with heart failure and stage 1 through stage 4 chronic kidney disease, or unspecified chronic kidney disease: Secondary | ICD-10-CM | POA: Diagnosis not present

## 2020-05-26 DIAGNOSIS — R627 Adult failure to thrive: Secondary | ICD-10-CM | POA: Diagnosis not present

## 2020-05-26 DIAGNOSIS — E43 Unspecified severe protein-calorie malnutrition: Secondary | ICD-10-CM | POA: Diagnosis not present

## 2020-05-26 DIAGNOSIS — I2510 Atherosclerotic heart disease of native coronary artery without angina pectoris: Secondary | ICD-10-CM | POA: Diagnosis not present

## 2020-05-26 DIAGNOSIS — D631 Anemia in chronic kidney disease: Secondary | ICD-10-CM | POA: Diagnosis not present

## 2020-05-26 DIAGNOSIS — I130 Hypertensive heart and chronic kidney disease with heart failure and stage 1 through stage 4 chronic kidney disease, or unspecified chronic kidney disease: Secondary | ICD-10-CM | POA: Diagnosis not present

## 2020-05-26 DIAGNOSIS — J45909 Unspecified asthma, uncomplicated: Secondary | ICD-10-CM | POA: Diagnosis not present

## 2020-05-26 DIAGNOSIS — J438 Other emphysema: Secondary | ICD-10-CM | POA: Diagnosis not present

## 2020-05-26 DIAGNOSIS — N184 Chronic kidney disease, stage 4 (severe): Secondary | ICD-10-CM | POA: Diagnosis not present

## 2020-05-26 DIAGNOSIS — I5033 Acute on chronic diastolic (congestive) heart failure: Secondary | ICD-10-CM | POA: Diagnosis not present

## 2020-05-31 DIAGNOSIS — J438 Other emphysema: Secondary | ICD-10-CM | POA: Diagnosis not present

## 2020-05-31 DIAGNOSIS — N184 Chronic kidney disease, stage 4 (severe): Secondary | ICD-10-CM | POA: Diagnosis not present

## 2020-05-31 DIAGNOSIS — I2510 Atherosclerotic heart disease of native coronary artery without angina pectoris: Secondary | ICD-10-CM | POA: Diagnosis not present

## 2020-05-31 DIAGNOSIS — J45909 Unspecified asthma, uncomplicated: Secondary | ICD-10-CM | POA: Diagnosis not present

## 2020-05-31 DIAGNOSIS — I130 Hypertensive heart and chronic kidney disease with heart failure and stage 1 through stage 4 chronic kidney disease, or unspecified chronic kidney disease: Secondary | ICD-10-CM | POA: Diagnosis not present

## 2020-05-31 DIAGNOSIS — E43 Unspecified severe protein-calorie malnutrition: Secondary | ICD-10-CM | POA: Diagnosis not present

## 2020-05-31 DIAGNOSIS — I5033 Acute on chronic diastolic (congestive) heart failure: Secondary | ICD-10-CM | POA: Diagnosis not present

## 2020-05-31 DIAGNOSIS — D631 Anemia in chronic kidney disease: Secondary | ICD-10-CM | POA: Diagnosis not present

## 2020-05-31 DIAGNOSIS — R627 Adult failure to thrive: Secondary | ICD-10-CM | POA: Diagnosis not present

## 2020-06-01 DIAGNOSIS — J438 Other emphysema: Secondary | ICD-10-CM | POA: Diagnosis not present

## 2020-06-01 DIAGNOSIS — I5033 Acute on chronic diastolic (congestive) heart failure: Secondary | ICD-10-CM | POA: Diagnosis not present

## 2020-06-01 DIAGNOSIS — R627 Adult failure to thrive: Secondary | ICD-10-CM | POA: Diagnosis not present

## 2020-06-01 DIAGNOSIS — E43 Unspecified severe protein-calorie malnutrition: Secondary | ICD-10-CM | POA: Diagnosis not present

## 2020-06-01 DIAGNOSIS — N184 Chronic kidney disease, stage 4 (severe): Secondary | ICD-10-CM | POA: Diagnosis not present

## 2020-06-01 DIAGNOSIS — D631 Anemia in chronic kidney disease: Secondary | ICD-10-CM | POA: Diagnosis not present

## 2020-06-01 DIAGNOSIS — I2510 Atherosclerotic heart disease of native coronary artery without angina pectoris: Secondary | ICD-10-CM | POA: Diagnosis not present

## 2020-06-01 DIAGNOSIS — I130 Hypertensive heart and chronic kidney disease with heart failure and stage 1 through stage 4 chronic kidney disease, or unspecified chronic kidney disease: Secondary | ICD-10-CM | POA: Diagnosis not present

## 2020-06-01 DIAGNOSIS — J45909 Unspecified asthma, uncomplicated: Secondary | ICD-10-CM | POA: Diagnosis not present

## 2020-06-02 DIAGNOSIS — I2510 Atherosclerotic heart disease of native coronary artery without angina pectoris: Secondary | ICD-10-CM | POA: Diagnosis not present

## 2020-06-02 DIAGNOSIS — J45909 Unspecified asthma, uncomplicated: Secondary | ICD-10-CM | POA: Diagnosis not present

## 2020-06-02 DIAGNOSIS — F325 Major depressive disorder, single episode, in full remission: Secondary | ICD-10-CM | POA: Diagnosis not present

## 2020-06-02 DIAGNOSIS — E039 Hypothyroidism, unspecified: Secondary | ICD-10-CM | POA: Diagnosis not present

## 2020-06-02 DIAGNOSIS — J438 Other emphysema: Secondary | ICD-10-CM | POA: Diagnosis not present

## 2020-06-02 DIAGNOSIS — H35033 Hypertensive retinopathy, bilateral: Secondary | ICD-10-CM | POA: Diagnosis not present

## 2020-06-02 DIAGNOSIS — J449 Chronic obstructive pulmonary disease, unspecified: Secondary | ICD-10-CM | POA: Diagnosis not present

## 2020-06-02 DIAGNOSIS — R627 Adult failure to thrive: Secondary | ICD-10-CM | POA: Diagnosis not present

## 2020-06-02 DIAGNOSIS — E7800 Pure hypercholesterolemia, unspecified: Secondary | ICD-10-CM | POA: Diagnosis not present

## 2020-06-02 DIAGNOSIS — I130 Hypertensive heart and chronic kidney disease with heart failure and stage 1 through stage 4 chronic kidney disease, or unspecified chronic kidney disease: Secondary | ICD-10-CM | POA: Diagnosis not present

## 2020-06-02 DIAGNOSIS — E43 Unspecified severe protein-calorie malnutrition: Secondary | ICD-10-CM | POA: Diagnosis not present

## 2020-06-02 DIAGNOSIS — I129 Hypertensive chronic kidney disease with stage 1 through stage 4 chronic kidney disease, or unspecified chronic kidney disease: Secondary | ICD-10-CM | POA: Diagnosis not present

## 2020-06-02 DIAGNOSIS — D631 Anemia in chronic kidney disease: Secondary | ICD-10-CM | POA: Diagnosis not present

## 2020-06-02 DIAGNOSIS — M810 Age-related osteoporosis without current pathological fracture: Secondary | ICD-10-CM | POA: Diagnosis not present

## 2020-06-02 DIAGNOSIS — I5033 Acute on chronic diastolic (congestive) heart failure: Secondary | ICD-10-CM | POA: Diagnosis not present

## 2020-06-02 DIAGNOSIS — N184 Chronic kidney disease, stage 4 (severe): Secondary | ICD-10-CM | POA: Diagnosis not present

## 2020-06-06 DIAGNOSIS — D631 Anemia in chronic kidney disease: Secondary | ICD-10-CM | POA: Diagnosis not present

## 2020-06-06 DIAGNOSIS — I2510 Atherosclerotic heart disease of native coronary artery without angina pectoris: Secondary | ICD-10-CM | POA: Diagnosis not present

## 2020-06-06 DIAGNOSIS — I5033 Acute on chronic diastolic (congestive) heart failure: Secondary | ICD-10-CM | POA: Diagnosis not present

## 2020-06-06 DIAGNOSIS — J45909 Unspecified asthma, uncomplicated: Secondary | ICD-10-CM | POA: Diagnosis not present

## 2020-06-06 DIAGNOSIS — R627 Adult failure to thrive: Secondary | ICD-10-CM | POA: Diagnosis not present

## 2020-06-06 DIAGNOSIS — N184 Chronic kidney disease, stage 4 (severe): Secondary | ICD-10-CM | POA: Diagnosis not present

## 2020-06-06 DIAGNOSIS — E43 Unspecified severe protein-calorie malnutrition: Secondary | ICD-10-CM | POA: Diagnosis not present

## 2020-06-06 DIAGNOSIS — J438 Other emphysema: Secondary | ICD-10-CM | POA: Diagnosis not present

## 2020-06-06 DIAGNOSIS — I130 Hypertensive heart and chronic kidney disease with heart failure and stage 1 through stage 4 chronic kidney disease, or unspecified chronic kidney disease: Secondary | ICD-10-CM | POA: Diagnosis not present

## 2020-06-07 DIAGNOSIS — N184 Chronic kidney disease, stage 4 (severe): Secondary | ICD-10-CM | POA: Diagnosis not present

## 2020-06-07 DIAGNOSIS — J438 Other emphysema: Secondary | ICD-10-CM | POA: Diagnosis not present

## 2020-06-07 DIAGNOSIS — I130 Hypertensive heart and chronic kidney disease with heart failure and stage 1 through stage 4 chronic kidney disease, or unspecified chronic kidney disease: Secondary | ICD-10-CM | POA: Diagnosis not present

## 2020-06-07 DIAGNOSIS — I2510 Atherosclerotic heart disease of native coronary artery without angina pectoris: Secondary | ICD-10-CM | POA: Diagnosis not present

## 2020-06-07 DIAGNOSIS — E43 Unspecified severe protein-calorie malnutrition: Secondary | ICD-10-CM | POA: Diagnosis not present

## 2020-06-07 DIAGNOSIS — R627 Adult failure to thrive: Secondary | ICD-10-CM | POA: Diagnosis not present

## 2020-06-07 DIAGNOSIS — I5033 Acute on chronic diastolic (congestive) heart failure: Secondary | ICD-10-CM | POA: Diagnosis not present

## 2020-06-07 DIAGNOSIS — J45909 Unspecified asthma, uncomplicated: Secondary | ICD-10-CM | POA: Diagnosis not present

## 2020-06-07 DIAGNOSIS — D631 Anemia in chronic kidney disease: Secondary | ICD-10-CM | POA: Diagnosis not present

## 2020-06-09 DIAGNOSIS — J438 Other emphysema: Secondary | ICD-10-CM | POA: Diagnosis not present

## 2020-06-09 DIAGNOSIS — E43 Unspecified severe protein-calorie malnutrition: Secondary | ICD-10-CM | POA: Diagnosis not present

## 2020-06-09 DIAGNOSIS — J45909 Unspecified asthma, uncomplicated: Secondary | ICD-10-CM | POA: Diagnosis not present

## 2020-06-09 DIAGNOSIS — D631 Anemia in chronic kidney disease: Secondary | ICD-10-CM | POA: Diagnosis not present

## 2020-06-09 DIAGNOSIS — I5033 Acute on chronic diastolic (congestive) heart failure: Secondary | ICD-10-CM | POA: Diagnosis not present

## 2020-06-09 DIAGNOSIS — R627 Adult failure to thrive: Secondary | ICD-10-CM | POA: Diagnosis not present

## 2020-06-09 DIAGNOSIS — I2510 Atherosclerotic heart disease of native coronary artery without angina pectoris: Secondary | ICD-10-CM | POA: Diagnosis not present

## 2020-06-09 DIAGNOSIS — I130 Hypertensive heart and chronic kidney disease with heart failure and stage 1 through stage 4 chronic kidney disease, or unspecified chronic kidney disease: Secondary | ICD-10-CM | POA: Diagnosis not present

## 2020-06-09 DIAGNOSIS — N184 Chronic kidney disease, stage 4 (severe): Secondary | ICD-10-CM | POA: Diagnosis not present

## 2020-06-13 DIAGNOSIS — N184 Chronic kidney disease, stage 4 (severe): Secondary | ICD-10-CM | POA: Diagnosis not present

## 2020-06-13 DIAGNOSIS — J438 Other emphysema: Secondary | ICD-10-CM | POA: Diagnosis not present

## 2020-06-13 DIAGNOSIS — I2510 Atherosclerotic heart disease of native coronary artery without angina pectoris: Secondary | ICD-10-CM | POA: Diagnosis not present

## 2020-06-13 DIAGNOSIS — I5033 Acute on chronic diastolic (congestive) heart failure: Secondary | ICD-10-CM | POA: Diagnosis not present

## 2020-06-13 DIAGNOSIS — R627 Adult failure to thrive: Secondary | ICD-10-CM | POA: Diagnosis not present

## 2020-06-13 DIAGNOSIS — E43 Unspecified severe protein-calorie malnutrition: Secondary | ICD-10-CM | POA: Diagnosis not present

## 2020-06-13 DIAGNOSIS — I130 Hypertensive heart and chronic kidney disease with heart failure and stage 1 through stage 4 chronic kidney disease, or unspecified chronic kidney disease: Secondary | ICD-10-CM | POA: Diagnosis not present

## 2020-06-13 DIAGNOSIS — D631 Anemia in chronic kidney disease: Secondary | ICD-10-CM | POA: Diagnosis not present

## 2020-06-13 DIAGNOSIS — J45909 Unspecified asthma, uncomplicated: Secondary | ICD-10-CM | POA: Diagnosis not present

## 2020-06-14 DIAGNOSIS — N184 Chronic kidney disease, stage 4 (severe): Secondary | ICD-10-CM | POA: Diagnosis not present

## 2020-06-14 DIAGNOSIS — I5033 Acute on chronic diastolic (congestive) heart failure: Secondary | ICD-10-CM | POA: Diagnosis not present

## 2020-06-14 DIAGNOSIS — J438 Other emphysema: Secondary | ICD-10-CM | POA: Diagnosis not present

## 2020-06-14 DIAGNOSIS — R627 Adult failure to thrive: Secondary | ICD-10-CM | POA: Diagnosis not present

## 2020-06-14 DIAGNOSIS — I130 Hypertensive heart and chronic kidney disease with heart failure and stage 1 through stage 4 chronic kidney disease, or unspecified chronic kidney disease: Secondary | ICD-10-CM | POA: Diagnosis not present

## 2020-06-14 DIAGNOSIS — I2510 Atherosclerotic heart disease of native coronary artery without angina pectoris: Secondary | ICD-10-CM | POA: Diagnosis not present

## 2020-06-14 DIAGNOSIS — D631 Anemia in chronic kidney disease: Secondary | ICD-10-CM | POA: Diagnosis not present

## 2020-06-14 DIAGNOSIS — E43 Unspecified severe protein-calorie malnutrition: Secondary | ICD-10-CM | POA: Diagnosis not present

## 2020-06-14 DIAGNOSIS — J45909 Unspecified asthma, uncomplicated: Secondary | ICD-10-CM | POA: Diagnosis not present

## 2020-06-22 DIAGNOSIS — I130 Hypertensive heart and chronic kidney disease with heart failure and stage 1 through stage 4 chronic kidney disease, or unspecified chronic kidney disease: Secondary | ICD-10-CM | POA: Diagnosis not present

## 2020-06-22 DIAGNOSIS — I5033 Acute on chronic diastolic (congestive) heart failure: Secondary | ICD-10-CM | POA: Diagnosis not present

## 2020-06-22 DIAGNOSIS — J438 Other emphysema: Secondary | ICD-10-CM | POA: Diagnosis not present

## 2020-06-22 DIAGNOSIS — I2510 Atherosclerotic heart disease of native coronary artery without angina pectoris: Secondary | ICD-10-CM | POA: Diagnosis not present

## 2020-06-22 DIAGNOSIS — N184 Chronic kidney disease, stage 4 (severe): Secondary | ICD-10-CM | POA: Diagnosis not present

## 2020-06-22 DIAGNOSIS — E43 Unspecified severe protein-calorie malnutrition: Secondary | ICD-10-CM | POA: Diagnosis not present

## 2020-06-22 DIAGNOSIS — R627 Adult failure to thrive: Secondary | ICD-10-CM | POA: Diagnosis not present

## 2020-06-22 DIAGNOSIS — D631 Anemia in chronic kidney disease: Secondary | ICD-10-CM | POA: Diagnosis not present

## 2020-06-22 DIAGNOSIS — J45909 Unspecified asthma, uncomplicated: Secondary | ICD-10-CM | POA: Diagnosis not present

## 2020-06-23 DIAGNOSIS — R627 Adult failure to thrive: Secondary | ICD-10-CM | POA: Diagnosis not present

## 2020-06-23 DIAGNOSIS — D631 Anemia in chronic kidney disease: Secondary | ICD-10-CM | POA: Diagnosis not present

## 2020-06-23 DIAGNOSIS — N184 Chronic kidney disease, stage 4 (severe): Secondary | ICD-10-CM | POA: Diagnosis not present

## 2020-06-23 DIAGNOSIS — J45909 Unspecified asthma, uncomplicated: Secondary | ICD-10-CM | POA: Diagnosis not present

## 2020-06-23 DIAGNOSIS — J438 Other emphysema: Secondary | ICD-10-CM | POA: Diagnosis not present

## 2020-06-23 DIAGNOSIS — I5033 Acute on chronic diastolic (congestive) heart failure: Secondary | ICD-10-CM | POA: Diagnosis not present

## 2020-06-23 DIAGNOSIS — I2510 Atherosclerotic heart disease of native coronary artery without angina pectoris: Secondary | ICD-10-CM | POA: Diagnosis not present

## 2020-06-23 DIAGNOSIS — E43 Unspecified severe protein-calorie malnutrition: Secondary | ICD-10-CM | POA: Diagnosis not present

## 2020-06-23 DIAGNOSIS — I130 Hypertensive heart and chronic kidney disease with heart failure and stage 1 through stage 4 chronic kidney disease, or unspecified chronic kidney disease: Secondary | ICD-10-CM | POA: Diagnosis not present

## 2020-06-25 DIAGNOSIS — I2510 Atherosclerotic heart disease of native coronary artery without angina pectoris: Secondary | ICD-10-CM | POA: Diagnosis not present

## 2020-06-25 DIAGNOSIS — N184 Chronic kidney disease, stage 4 (severe): Secondary | ICD-10-CM | POA: Diagnosis not present

## 2020-06-25 DIAGNOSIS — J438 Other emphysema: Secondary | ICD-10-CM | POA: Diagnosis not present

## 2020-06-25 DIAGNOSIS — I5033 Acute on chronic diastolic (congestive) heart failure: Secondary | ICD-10-CM | POA: Diagnosis not present

## 2020-06-25 DIAGNOSIS — I130 Hypertensive heart and chronic kidney disease with heart failure and stage 1 through stage 4 chronic kidney disease, or unspecified chronic kidney disease: Secondary | ICD-10-CM | POA: Diagnosis not present

## 2020-06-25 DIAGNOSIS — E43 Unspecified severe protein-calorie malnutrition: Secondary | ICD-10-CM | POA: Diagnosis not present

## 2020-06-25 DIAGNOSIS — R627 Adult failure to thrive: Secondary | ICD-10-CM | POA: Diagnosis not present

## 2020-06-25 DIAGNOSIS — J45909 Unspecified asthma, uncomplicated: Secondary | ICD-10-CM | POA: Diagnosis not present

## 2020-06-25 DIAGNOSIS — D631 Anemia in chronic kidney disease: Secondary | ICD-10-CM | POA: Diagnosis not present

## 2020-06-27 DIAGNOSIS — R198 Other specified symptoms and signs involving the digestive system and abdomen: Secondary | ICD-10-CM | POA: Diagnosis not present

## 2020-06-27 DIAGNOSIS — N179 Acute kidney failure, unspecified: Secondary | ICD-10-CM | POA: Diagnosis not present

## 2020-06-27 DIAGNOSIS — Z940 Kidney transplant status: Secondary | ICD-10-CM | POA: Diagnosis not present

## 2020-06-28 DIAGNOSIS — R627 Adult failure to thrive: Secondary | ICD-10-CM | POA: Diagnosis not present

## 2020-06-28 DIAGNOSIS — I2510 Atherosclerotic heart disease of native coronary artery without angina pectoris: Secondary | ICD-10-CM | POA: Diagnosis not present

## 2020-06-28 DIAGNOSIS — N184 Chronic kidney disease, stage 4 (severe): Secondary | ICD-10-CM | POA: Diagnosis not present

## 2020-06-28 DIAGNOSIS — I130 Hypertensive heart and chronic kidney disease with heart failure and stage 1 through stage 4 chronic kidney disease, or unspecified chronic kidney disease: Secondary | ICD-10-CM | POA: Diagnosis not present

## 2020-06-28 DIAGNOSIS — J438 Other emphysema: Secondary | ICD-10-CM | POA: Diagnosis not present

## 2020-06-28 DIAGNOSIS — D631 Anemia in chronic kidney disease: Secondary | ICD-10-CM | POA: Diagnosis not present

## 2020-06-28 DIAGNOSIS — I5033 Acute on chronic diastolic (congestive) heart failure: Secondary | ICD-10-CM | POA: Diagnosis not present

## 2020-06-28 DIAGNOSIS — E43 Unspecified severe protein-calorie malnutrition: Secondary | ICD-10-CM | POA: Diagnosis not present

## 2020-06-28 DIAGNOSIS — J45909 Unspecified asthma, uncomplicated: Secondary | ICD-10-CM | POA: Diagnosis not present

## 2020-06-30 DIAGNOSIS — N179 Acute kidney failure, unspecified: Secondary | ICD-10-CM | POA: Diagnosis not present

## 2020-06-30 DIAGNOSIS — Z940 Kidney transplant status: Secondary | ICD-10-CM | POA: Diagnosis not present

## 2020-07-01 DIAGNOSIS — Z79899 Other long term (current) drug therapy: Secondary | ICD-10-CM | POA: Diagnosis not present

## 2020-07-01 DIAGNOSIS — Z7952 Long term (current) use of systemic steroids: Secondary | ICD-10-CM | POA: Diagnosis not present

## 2020-07-01 DIAGNOSIS — Z7409 Other reduced mobility: Secondary | ICD-10-CM | POA: Diagnosis not present

## 2020-07-01 DIAGNOSIS — Z811 Family history of alcohol abuse and dependence: Secondary | ICD-10-CM | POA: Diagnosis not present

## 2020-07-01 DIAGNOSIS — J309 Allergic rhinitis, unspecified: Secondary | ICD-10-CM | POA: Diagnosis not present

## 2020-07-01 DIAGNOSIS — J439 Emphysema, unspecified: Secondary | ICD-10-CM | POA: Diagnosis not present

## 2020-07-01 DIAGNOSIS — E039 Hypothyroidism, unspecified: Secondary | ICD-10-CM | POA: Diagnosis not present

## 2020-07-01 DIAGNOSIS — Z940 Kidney transplant status: Secondary | ICD-10-CM | POA: Diagnosis not present

## 2020-07-01 DIAGNOSIS — N179 Acute kidney failure, unspecified: Secondary | ICD-10-CM | POA: Diagnosis not present

## 2020-07-01 DIAGNOSIS — F329 Major depressive disorder, single episode, unspecified: Secondary | ICD-10-CM | POA: Diagnosis not present

## 2020-07-01 DIAGNOSIS — I129 Hypertensive chronic kidney disease with stage 1 through stage 4 chronic kidney disease, or unspecified chronic kidney disease: Secondary | ICD-10-CM | POA: Diagnosis not present

## 2020-07-01 DIAGNOSIS — N189 Chronic kidney disease, unspecified: Secondary | ICD-10-CM | POA: Diagnosis not present

## 2020-07-01 DIAGNOSIS — I2510 Atherosclerotic heart disease of native coronary artery without angina pectoris: Secondary | ICD-10-CM | POA: Diagnosis not present

## 2020-07-01 DIAGNOSIS — I82509 Chronic embolism and thrombosis of unspecified deep veins of unspecified lower extremity: Secondary | ICD-10-CM | POA: Diagnosis not present

## 2020-07-01 DIAGNOSIS — R32 Unspecified urinary incontinence: Secondary | ICD-10-CM | POA: Diagnosis not present

## 2020-07-01 DIAGNOSIS — K219 Gastro-esophageal reflux disease without esophagitis: Secondary | ICD-10-CM | POA: Diagnosis not present

## 2020-07-01 DIAGNOSIS — F419 Anxiety disorder, unspecified: Secondary | ICD-10-CM | POA: Diagnosis not present

## 2020-07-01 DIAGNOSIS — Z7901 Long term (current) use of anticoagulants: Secondary | ICD-10-CM | POA: Diagnosis not present

## 2020-07-01 DIAGNOSIS — Z96649 Presence of unspecified artificial hip joint: Secondary | ICD-10-CM | POA: Diagnosis not present

## 2020-07-06 DIAGNOSIS — F325 Major depressive disorder, single episode, in full remission: Secondary | ICD-10-CM | POA: Diagnosis not present

## 2020-07-06 DIAGNOSIS — J449 Chronic obstructive pulmonary disease, unspecified: Secondary | ICD-10-CM | POA: Diagnosis not present

## 2020-07-06 DIAGNOSIS — I129 Hypertensive chronic kidney disease with stage 1 through stage 4 chronic kidney disease, or unspecified chronic kidney disease: Secondary | ICD-10-CM | POA: Diagnosis not present

## 2020-07-06 DIAGNOSIS — M810 Age-related osteoporosis without current pathological fracture: Secondary | ICD-10-CM | POA: Diagnosis not present

## 2020-07-06 DIAGNOSIS — E039 Hypothyroidism, unspecified: Secondary | ICD-10-CM | POA: Diagnosis not present

## 2020-07-06 DIAGNOSIS — H35033 Hypertensive retinopathy, bilateral: Secondary | ICD-10-CM | POA: Diagnosis not present

## 2020-07-06 DIAGNOSIS — N184 Chronic kidney disease, stage 4 (severe): Secondary | ICD-10-CM | POA: Diagnosis not present

## 2020-07-06 DIAGNOSIS — E7800 Pure hypercholesterolemia, unspecified: Secondary | ICD-10-CM | POA: Diagnosis not present

## 2020-07-12 DIAGNOSIS — N184 Chronic kidney disease, stage 4 (severe): Secondary | ICD-10-CM | POA: Diagnosis not present

## 2020-07-12 DIAGNOSIS — I2510 Atherosclerotic heart disease of native coronary artery without angina pectoris: Secondary | ICD-10-CM | POA: Diagnosis not present

## 2020-07-12 DIAGNOSIS — I5033 Acute on chronic diastolic (congestive) heart failure: Secondary | ICD-10-CM | POA: Diagnosis not present

## 2020-07-12 DIAGNOSIS — I130 Hypertensive heart and chronic kidney disease with heart failure and stage 1 through stage 4 chronic kidney disease, or unspecified chronic kidney disease: Secondary | ICD-10-CM | POA: Diagnosis not present

## 2020-07-12 DIAGNOSIS — J45909 Unspecified asthma, uncomplicated: Secondary | ICD-10-CM | POA: Diagnosis not present

## 2020-07-12 DIAGNOSIS — J438 Other emphysema: Secondary | ICD-10-CM | POA: Diagnosis not present

## 2020-07-12 DIAGNOSIS — R627 Adult failure to thrive: Secondary | ICD-10-CM | POA: Diagnosis not present

## 2020-07-12 DIAGNOSIS — E43 Unspecified severe protein-calorie malnutrition: Secondary | ICD-10-CM | POA: Diagnosis not present

## 2020-07-12 DIAGNOSIS — D631 Anemia in chronic kidney disease: Secondary | ICD-10-CM | POA: Diagnosis not present

## 2020-07-27 DIAGNOSIS — D631 Anemia in chronic kidney disease: Secondary | ICD-10-CM | POA: Diagnosis not present

## 2020-07-27 DIAGNOSIS — N2581 Secondary hyperparathyroidism of renal origin: Secondary | ICD-10-CM | POA: Diagnosis not present

## 2020-07-27 DIAGNOSIS — N184 Chronic kidney disease, stage 4 (severe): Secondary | ICD-10-CM | POA: Diagnosis not present

## 2020-07-27 DIAGNOSIS — I129 Hypertensive chronic kidney disease with stage 1 through stage 4 chronic kidney disease, or unspecified chronic kidney disease: Secondary | ICD-10-CM | POA: Diagnosis not present

## 2020-07-27 DIAGNOSIS — Z940 Kidney transplant status: Secondary | ICD-10-CM | POA: Diagnosis not present

## 2020-08-03 DIAGNOSIS — E039 Hypothyroidism, unspecified: Secondary | ICD-10-CM | POA: Diagnosis not present

## 2020-08-03 DIAGNOSIS — I10 Essential (primary) hypertension: Secondary | ICD-10-CM | POA: Diagnosis not present

## 2020-08-03 DIAGNOSIS — Z7901 Long term (current) use of anticoagulants: Secondary | ICD-10-CM | POA: Diagnosis not present

## 2020-08-03 DIAGNOSIS — F32A Depression, unspecified: Secondary | ICD-10-CM | POA: Diagnosis not present

## 2020-08-03 DIAGNOSIS — E876 Hypokalemia: Secondary | ICD-10-CM | POA: Diagnosis not present

## 2020-08-03 DIAGNOSIS — R778 Other specified abnormalities of plasma proteins: Secondary | ICD-10-CM | POA: Diagnosis not present

## 2020-08-03 DIAGNOSIS — R1110 Vomiting, unspecified: Secondary | ICD-10-CM | POA: Diagnosis not present

## 2020-08-03 DIAGNOSIS — Z79899 Other long term (current) drug therapy: Secondary | ICD-10-CM | POA: Diagnosis not present

## 2020-08-03 DIAGNOSIS — R112 Nausea with vomiting, unspecified: Secondary | ICD-10-CM | POA: Diagnosis not present

## 2020-08-03 DIAGNOSIS — K529 Noninfective gastroenteritis and colitis, unspecified: Secondary | ICD-10-CM | POA: Diagnosis not present

## 2020-08-04 DIAGNOSIS — I160 Hypertensive urgency: Secondary | ICD-10-CM | POA: Diagnosis not present

## 2020-08-04 DIAGNOSIS — N1832 Chronic kidney disease, stage 3b: Secondary | ICD-10-CM | POA: Diagnosis not present

## 2020-08-04 DIAGNOSIS — I129 Hypertensive chronic kidney disease with stage 1 through stage 4 chronic kidney disease, or unspecified chronic kidney disease: Secondary | ICD-10-CM | POA: Diagnosis not present

## 2020-08-04 DIAGNOSIS — Z940 Kidney transplant status: Secondary | ICD-10-CM | POA: Diagnosis not present

## 2020-08-04 DIAGNOSIS — R778 Other specified abnormalities of plasma proteins: Secondary | ICD-10-CM | POA: Diagnosis not present

## 2020-08-04 DIAGNOSIS — K529 Noninfective gastroenteritis and colitis, unspecified: Secondary | ICD-10-CM | POA: Diagnosis not present

## 2020-08-05 DIAGNOSIS — Z940 Kidney transplant status: Secondary | ICD-10-CM | POA: Diagnosis not present

## 2020-08-05 DIAGNOSIS — R778 Other specified abnormalities of plasma proteins: Secondary | ICD-10-CM | POA: Diagnosis not present

## 2020-08-05 DIAGNOSIS — I361 Nonrheumatic tricuspid (valve) insufficiency: Secondary | ICD-10-CM | POA: Diagnosis not present

## 2020-08-05 DIAGNOSIS — I340 Nonrheumatic mitral (valve) insufficiency: Secondary | ICD-10-CM | POA: Diagnosis not present

## 2020-08-05 DIAGNOSIS — I160 Hypertensive urgency: Secondary | ICD-10-CM | POA: Diagnosis not present

## 2020-08-05 DIAGNOSIS — K529 Noninfective gastroenteritis and colitis, unspecified: Secondary | ICD-10-CM | POA: Diagnosis not present

## 2020-08-06 DIAGNOSIS — R778 Other specified abnormalities of plasma proteins: Secondary | ICD-10-CM | POA: Diagnosis not present

## 2020-08-06 DIAGNOSIS — I160 Hypertensive urgency: Secondary | ICD-10-CM | POA: Diagnosis not present

## 2020-08-06 DIAGNOSIS — K529 Noninfective gastroenteritis and colitis, unspecified: Secondary | ICD-10-CM | POA: Diagnosis not present

## 2020-08-06 DIAGNOSIS — Z940 Kidney transplant status: Secondary | ICD-10-CM | POA: Diagnosis not present

## 2020-08-08 DIAGNOSIS — J449 Chronic obstructive pulmonary disease, unspecified: Secondary | ICD-10-CM | POA: Diagnosis not present

## 2020-08-08 DIAGNOSIS — F325 Major depressive disorder, single episode, in full remission: Secondary | ICD-10-CM | POA: Diagnosis not present

## 2020-08-08 DIAGNOSIS — N184 Chronic kidney disease, stage 4 (severe): Secondary | ICD-10-CM | POA: Diagnosis not present

## 2020-08-08 DIAGNOSIS — E039 Hypothyroidism, unspecified: Secondary | ICD-10-CM | POA: Diagnosis not present

## 2020-08-08 DIAGNOSIS — M810 Age-related osteoporosis without current pathological fracture: Secondary | ICD-10-CM | POA: Diagnosis not present

## 2020-08-08 DIAGNOSIS — H35033 Hypertensive retinopathy, bilateral: Secondary | ICD-10-CM | POA: Diagnosis not present

## 2020-08-08 DIAGNOSIS — I129 Hypertensive chronic kidney disease with stage 1 through stage 4 chronic kidney disease, or unspecified chronic kidney disease: Secondary | ICD-10-CM | POA: Diagnosis not present

## 2020-08-08 DIAGNOSIS — E7800 Pure hypercholesterolemia, unspecified: Secondary | ICD-10-CM | POA: Diagnosis not present

## 2020-08-15 DIAGNOSIS — Z23 Encounter for immunization: Secondary | ICD-10-CM | POA: Diagnosis not present

## 2020-08-15 DIAGNOSIS — R269 Unspecified abnormalities of gait and mobility: Secondary | ICD-10-CM | POA: Diagnosis not present

## 2020-08-15 DIAGNOSIS — I129 Hypertensive chronic kidney disease with stage 1 through stage 4 chronic kidney disease, or unspecified chronic kidney disease: Secondary | ICD-10-CM | POA: Diagnosis not present

## 2020-08-15 DIAGNOSIS — N184 Chronic kidney disease, stage 4 (severe): Secondary | ICD-10-CM | POA: Diagnosis not present

## 2020-08-19 DIAGNOSIS — I129 Hypertensive chronic kidney disease with stage 1 through stage 4 chronic kidney disease, or unspecified chronic kidney disease: Secondary | ICD-10-CM | POA: Diagnosis not present

## 2020-08-19 DIAGNOSIS — F419 Anxiety disorder, unspecified: Secondary | ICD-10-CM | POA: Diagnosis not present

## 2020-08-19 DIAGNOSIS — M810 Age-related osteoporosis without current pathological fracture: Secondary | ICD-10-CM | POA: Diagnosis not present

## 2020-08-19 DIAGNOSIS — N184 Chronic kidney disease, stage 4 (severe): Secondary | ICD-10-CM | POA: Diagnosis not present

## 2020-08-19 DIAGNOSIS — J449 Chronic obstructive pulmonary disease, unspecified: Secondary | ICD-10-CM | POA: Diagnosis not present

## 2020-08-19 DIAGNOSIS — I2510 Atherosclerotic heart disease of native coronary artery without angina pectoris: Secondary | ICD-10-CM | POA: Diagnosis not present

## 2020-08-19 DIAGNOSIS — G8929 Other chronic pain: Secondary | ICD-10-CM | POA: Diagnosis not present

## 2020-08-19 DIAGNOSIS — M1990 Unspecified osteoarthritis, unspecified site: Secondary | ICD-10-CM | POA: Diagnosis not present

## 2020-08-19 DIAGNOSIS — F325 Major depressive disorder, single episode, in full remission: Secondary | ICD-10-CM | POA: Diagnosis not present

## 2020-08-25 DIAGNOSIS — F419 Anxiety disorder, unspecified: Secondary | ICD-10-CM | POA: Diagnosis not present

## 2020-08-25 DIAGNOSIS — J449 Chronic obstructive pulmonary disease, unspecified: Secondary | ICD-10-CM | POA: Diagnosis not present

## 2020-08-25 DIAGNOSIS — I2510 Atherosclerotic heart disease of native coronary artery without angina pectoris: Secondary | ICD-10-CM | POA: Diagnosis not present

## 2020-08-25 DIAGNOSIS — I129 Hypertensive chronic kidney disease with stage 1 through stage 4 chronic kidney disease, or unspecified chronic kidney disease: Secondary | ICD-10-CM | POA: Diagnosis not present

## 2020-08-25 DIAGNOSIS — M1990 Unspecified osteoarthritis, unspecified site: Secondary | ICD-10-CM | POA: Diagnosis not present

## 2020-08-25 DIAGNOSIS — G8929 Other chronic pain: Secondary | ICD-10-CM | POA: Diagnosis not present

## 2020-08-25 DIAGNOSIS — N184 Chronic kidney disease, stage 4 (severe): Secondary | ICD-10-CM | POA: Diagnosis not present

## 2020-08-25 DIAGNOSIS — F325 Major depressive disorder, single episode, in full remission: Secondary | ICD-10-CM | POA: Diagnosis not present

## 2020-08-25 DIAGNOSIS — M810 Age-related osteoporosis without current pathological fracture: Secondary | ICD-10-CM | POA: Diagnosis not present

## 2020-08-26 DIAGNOSIS — N184 Chronic kidney disease, stage 4 (severe): Secondary | ICD-10-CM | POA: Diagnosis not present

## 2020-08-26 DIAGNOSIS — F325 Major depressive disorder, single episode, in full remission: Secondary | ICD-10-CM | POA: Diagnosis not present

## 2020-08-26 DIAGNOSIS — M810 Age-related osteoporosis without current pathological fracture: Secondary | ICD-10-CM | POA: Diagnosis not present

## 2020-08-26 DIAGNOSIS — J449 Chronic obstructive pulmonary disease, unspecified: Secondary | ICD-10-CM | POA: Diagnosis not present

## 2020-08-26 DIAGNOSIS — F419 Anxiety disorder, unspecified: Secondary | ICD-10-CM | POA: Diagnosis not present

## 2020-08-26 DIAGNOSIS — I2510 Atherosclerotic heart disease of native coronary artery without angina pectoris: Secondary | ICD-10-CM | POA: Diagnosis not present

## 2020-08-26 DIAGNOSIS — I129 Hypertensive chronic kidney disease with stage 1 through stage 4 chronic kidney disease, or unspecified chronic kidney disease: Secondary | ICD-10-CM | POA: Diagnosis not present

## 2020-08-26 DIAGNOSIS — G8929 Other chronic pain: Secondary | ICD-10-CM | POA: Diagnosis not present

## 2020-08-26 DIAGNOSIS — M1990 Unspecified osteoarthritis, unspecified site: Secondary | ICD-10-CM | POA: Diagnosis not present

## 2020-08-30 DIAGNOSIS — M1990 Unspecified osteoarthritis, unspecified site: Secondary | ICD-10-CM | POA: Diagnosis not present

## 2020-08-30 DIAGNOSIS — I129 Hypertensive chronic kidney disease with stage 1 through stage 4 chronic kidney disease, or unspecified chronic kidney disease: Secondary | ICD-10-CM | POA: Diagnosis not present

## 2020-08-30 DIAGNOSIS — M810 Age-related osteoporosis without current pathological fracture: Secondary | ICD-10-CM | POA: Diagnosis not present

## 2020-08-30 DIAGNOSIS — J449 Chronic obstructive pulmonary disease, unspecified: Secondary | ICD-10-CM | POA: Diagnosis not present

## 2020-08-30 DIAGNOSIS — F419 Anxiety disorder, unspecified: Secondary | ICD-10-CM | POA: Diagnosis not present

## 2020-08-30 DIAGNOSIS — G8929 Other chronic pain: Secondary | ICD-10-CM | POA: Diagnosis not present

## 2020-08-30 DIAGNOSIS — I2510 Atherosclerotic heart disease of native coronary artery without angina pectoris: Secondary | ICD-10-CM | POA: Diagnosis not present

## 2020-08-30 DIAGNOSIS — N184 Chronic kidney disease, stage 4 (severe): Secondary | ICD-10-CM | POA: Diagnosis not present

## 2020-08-30 DIAGNOSIS — F325 Major depressive disorder, single episode, in full remission: Secondary | ICD-10-CM | POA: Diagnosis not present

## 2020-09-01 DIAGNOSIS — F325 Major depressive disorder, single episode, in full remission: Secondary | ICD-10-CM | POA: Diagnosis not present

## 2020-09-01 DIAGNOSIS — J449 Chronic obstructive pulmonary disease, unspecified: Secondary | ICD-10-CM | POA: Diagnosis not present

## 2020-09-01 DIAGNOSIS — M1990 Unspecified osteoarthritis, unspecified site: Secondary | ICD-10-CM | POA: Diagnosis not present

## 2020-09-01 DIAGNOSIS — I2510 Atherosclerotic heart disease of native coronary artery without angina pectoris: Secondary | ICD-10-CM | POA: Diagnosis not present

## 2020-09-01 DIAGNOSIS — M810 Age-related osteoporosis without current pathological fracture: Secondary | ICD-10-CM | POA: Diagnosis not present

## 2020-09-01 DIAGNOSIS — G8929 Other chronic pain: Secondary | ICD-10-CM | POA: Diagnosis not present

## 2020-09-01 DIAGNOSIS — F419 Anxiety disorder, unspecified: Secondary | ICD-10-CM | POA: Diagnosis not present

## 2020-09-01 DIAGNOSIS — N184 Chronic kidney disease, stage 4 (severe): Secondary | ICD-10-CM | POA: Diagnosis not present

## 2020-09-01 DIAGNOSIS — I129 Hypertensive chronic kidney disease with stage 1 through stage 4 chronic kidney disease, or unspecified chronic kidney disease: Secondary | ICD-10-CM | POA: Diagnosis not present

## 2020-09-02 DIAGNOSIS — E039 Hypothyroidism, unspecified: Secondary | ICD-10-CM | POA: Diagnosis not present

## 2020-09-02 DIAGNOSIS — J449 Chronic obstructive pulmonary disease, unspecified: Secondary | ICD-10-CM | POA: Diagnosis not present

## 2020-09-02 DIAGNOSIS — N184 Chronic kidney disease, stage 4 (severe): Secondary | ICD-10-CM | POA: Diagnosis not present

## 2020-09-02 DIAGNOSIS — I129 Hypertensive chronic kidney disease with stage 1 through stage 4 chronic kidney disease, or unspecified chronic kidney disease: Secondary | ICD-10-CM | POA: Diagnosis not present

## 2020-09-02 DIAGNOSIS — D649 Anemia, unspecified: Secondary | ICD-10-CM | POA: Diagnosis not present

## 2020-09-02 DIAGNOSIS — R197 Diarrhea, unspecified: Secondary | ICD-10-CM | POA: Diagnosis not present

## 2020-09-02 DIAGNOSIS — M810 Age-related osteoporosis without current pathological fracture: Secondary | ICD-10-CM | POA: Diagnosis not present

## 2020-09-02 DIAGNOSIS — F325 Major depressive disorder, single episode, in full remission: Secondary | ICD-10-CM | POA: Diagnosis not present

## 2020-09-02 DIAGNOSIS — E7800 Pure hypercholesterolemia, unspecified: Secondary | ICD-10-CM | POA: Diagnosis not present

## 2020-09-02 DIAGNOSIS — H35033 Hypertensive retinopathy, bilateral: Secondary | ICD-10-CM | POA: Diagnosis not present

## 2020-09-05 DIAGNOSIS — F325 Major depressive disorder, single episode, in full remission: Secondary | ICD-10-CM | POA: Diagnosis not present

## 2020-09-05 DIAGNOSIS — F419 Anxiety disorder, unspecified: Secondary | ICD-10-CM | POA: Diagnosis not present

## 2020-09-05 DIAGNOSIS — N184 Chronic kidney disease, stage 4 (severe): Secondary | ICD-10-CM | POA: Diagnosis not present

## 2020-09-05 DIAGNOSIS — I2510 Atherosclerotic heart disease of native coronary artery without angina pectoris: Secondary | ICD-10-CM | POA: Diagnosis not present

## 2020-09-05 DIAGNOSIS — M810 Age-related osteoporosis without current pathological fracture: Secondary | ICD-10-CM | POA: Diagnosis not present

## 2020-09-05 DIAGNOSIS — M1990 Unspecified osteoarthritis, unspecified site: Secondary | ICD-10-CM | POA: Diagnosis not present

## 2020-09-05 DIAGNOSIS — J449 Chronic obstructive pulmonary disease, unspecified: Secondary | ICD-10-CM | POA: Diagnosis not present

## 2020-09-05 DIAGNOSIS — G8929 Other chronic pain: Secondary | ICD-10-CM | POA: Diagnosis not present

## 2020-09-05 DIAGNOSIS — I129 Hypertensive chronic kidney disease with stage 1 through stage 4 chronic kidney disease, or unspecified chronic kidney disease: Secondary | ICD-10-CM | POA: Diagnosis not present

## 2020-09-07 DIAGNOSIS — M1990 Unspecified osteoarthritis, unspecified site: Secondary | ICD-10-CM | POA: Diagnosis not present

## 2020-09-07 DIAGNOSIS — G8929 Other chronic pain: Secondary | ICD-10-CM | POA: Diagnosis not present

## 2020-09-07 DIAGNOSIS — J449 Chronic obstructive pulmonary disease, unspecified: Secondary | ICD-10-CM | POA: Diagnosis not present

## 2020-09-07 DIAGNOSIS — F419 Anxiety disorder, unspecified: Secondary | ICD-10-CM | POA: Diagnosis not present

## 2020-09-07 DIAGNOSIS — N184 Chronic kidney disease, stage 4 (severe): Secondary | ICD-10-CM | POA: Diagnosis not present

## 2020-09-07 DIAGNOSIS — M810 Age-related osteoporosis without current pathological fracture: Secondary | ICD-10-CM | POA: Diagnosis not present

## 2020-09-07 DIAGNOSIS — I2510 Atherosclerotic heart disease of native coronary artery without angina pectoris: Secondary | ICD-10-CM | POA: Diagnosis not present

## 2020-09-07 DIAGNOSIS — F325 Major depressive disorder, single episode, in full remission: Secondary | ICD-10-CM | POA: Diagnosis not present

## 2020-09-07 DIAGNOSIS — I129 Hypertensive chronic kidney disease with stage 1 through stage 4 chronic kidney disease, or unspecified chronic kidney disease: Secondary | ICD-10-CM | POA: Diagnosis not present

## 2020-09-14 DIAGNOSIS — F325 Major depressive disorder, single episode, in full remission: Secondary | ICD-10-CM | POA: Diagnosis not present

## 2020-09-14 DIAGNOSIS — M1990 Unspecified osteoarthritis, unspecified site: Secondary | ICD-10-CM | POA: Diagnosis not present

## 2020-09-14 DIAGNOSIS — N184 Chronic kidney disease, stage 4 (severe): Secondary | ICD-10-CM | POA: Diagnosis not present

## 2020-09-14 DIAGNOSIS — F419 Anxiety disorder, unspecified: Secondary | ICD-10-CM | POA: Diagnosis not present

## 2020-09-14 DIAGNOSIS — I2510 Atherosclerotic heart disease of native coronary artery without angina pectoris: Secondary | ICD-10-CM | POA: Diagnosis not present

## 2020-09-14 DIAGNOSIS — M810 Age-related osteoporosis without current pathological fracture: Secondary | ICD-10-CM | POA: Diagnosis not present

## 2020-09-14 DIAGNOSIS — J449 Chronic obstructive pulmonary disease, unspecified: Secondary | ICD-10-CM | POA: Diagnosis not present

## 2020-09-14 DIAGNOSIS — I129 Hypertensive chronic kidney disease with stage 1 through stage 4 chronic kidney disease, or unspecified chronic kidney disease: Secondary | ICD-10-CM | POA: Diagnosis not present

## 2020-09-14 DIAGNOSIS — G8929 Other chronic pain: Secondary | ICD-10-CM | POA: Diagnosis not present

## 2020-09-15 DIAGNOSIS — J449 Chronic obstructive pulmonary disease, unspecified: Secondary | ICD-10-CM | POA: Diagnosis not present

## 2020-09-15 DIAGNOSIS — M810 Age-related osteoporosis without current pathological fracture: Secondary | ICD-10-CM | POA: Diagnosis not present

## 2020-09-15 DIAGNOSIS — F419 Anxiety disorder, unspecified: Secondary | ICD-10-CM | POA: Diagnosis not present

## 2020-09-15 DIAGNOSIS — I129 Hypertensive chronic kidney disease with stage 1 through stage 4 chronic kidney disease, or unspecified chronic kidney disease: Secondary | ICD-10-CM | POA: Diagnosis not present

## 2020-09-15 DIAGNOSIS — M1990 Unspecified osteoarthritis, unspecified site: Secondary | ICD-10-CM | POA: Diagnosis not present

## 2020-09-15 DIAGNOSIS — I2510 Atherosclerotic heart disease of native coronary artery without angina pectoris: Secondary | ICD-10-CM | POA: Diagnosis not present

## 2020-09-15 DIAGNOSIS — N184 Chronic kidney disease, stage 4 (severe): Secondary | ICD-10-CM | POA: Diagnosis not present

## 2020-09-15 DIAGNOSIS — G8929 Other chronic pain: Secondary | ICD-10-CM | POA: Diagnosis not present

## 2020-09-15 DIAGNOSIS — F325 Major depressive disorder, single episode, in full remission: Secondary | ICD-10-CM | POA: Diagnosis not present

## 2020-09-18 DIAGNOSIS — I2510 Atherosclerotic heart disease of native coronary artery without angina pectoris: Secondary | ICD-10-CM | POA: Diagnosis not present

## 2020-09-18 DIAGNOSIS — M810 Age-related osteoporosis without current pathological fracture: Secondary | ICD-10-CM | POA: Diagnosis not present

## 2020-09-18 DIAGNOSIS — J449 Chronic obstructive pulmonary disease, unspecified: Secondary | ICD-10-CM | POA: Diagnosis not present

## 2020-09-18 DIAGNOSIS — M1990 Unspecified osteoarthritis, unspecified site: Secondary | ICD-10-CM | POA: Diagnosis not present

## 2020-09-18 DIAGNOSIS — N184 Chronic kidney disease, stage 4 (severe): Secondary | ICD-10-CM | POA: Diagnosis not present

## 2020-09-18 DIAGNOSIS — F419 Anxiety disorder, unspecified: Secondary | ICD-10-CM | POA: Diagnosis not present

## 2020-09-18 DIAGNOSIS — F325 Major depressive disorder, single episode, in full remission: Secondary | ICD-10-CM | POA: Diagnosis not present

## 2020-09-18 DIAGNOSIS — G8929 Other chronic pain: Secondary | ICD-10-CM | POA: Diagnosis not present

## 2020-09-18 DIAGNOSIS — I129 Hypertensive chronic kidney disease with stage 1 through stage 4 chronic kidney disease, or unspecified chronic kidney disease: Secondary | ICD-10-CM | POA: Diagnosis not present

## 2020-09-20 DIAGNOSIS — M1990 Unspecified osteoarthritis, unspecified site: Secondary | ICD-10-CM | POA: Diagnosis not present

## 2020-09-20 DIAGNOSIS — F325 Major depressive disorder, single episode, in full remission: Secondary | ICD-10-CM | POA: Diagnosis not present

## 2020-09-20 DIAGNOSIS — G8929 Other chronic pain: Secondary | ICD-10-CM | POA: Diagnosis not present

## 2020-09-20 DIAGNOSIS — I129 Hypertensive chronic kidney disease with stage 1 through stage 4 chronic kidney disease, or unspecified chronic kidney disease: Secondary | ICD-10-CM | POA: Diagnosis not present

## 2020-09-20 DIAGNOSIS — M810 Age-related osteoporosis without current pathological fracture: Secondary | ICD-10-CM | POA: Diagnosis not present

## 2020-09-20 DIAGNOSIS — I2510 Atherosclerotic heart disease of native coronary artery without angina pectoris: Secondary | ICD-10-CM | POA: Diagnosis not present

## 2020-09-20 DIAGNOSIS — N184 Chronic kidney disease, stage 4 (severe): Secondary | ICD-10-CM | POA: Diagnosis not present

## 2020-09-20 DIAGNOSIS — F419 Anxiety disorder, unspecified: Secondary | ICD-10-CM | POA: Diagnosis not present

## 2020-09-20 DIAGNOSIS — J449 Chronic obstructive pulmonary disease, unspecified: Secondary | ICD-10-CM | POA: Diagnosis not present

## 2020-09-26 DIAGNOSIS — N184 Chronic kidney disease, stage 4 (severe): Secondary | ICD-10-CM | POA: Diagnosis not present

## 2020-09-28 DIAGNOSIS — I129 Hypertensive chronic kidney disease with stage 1 through stage 4 chronic kidney disease, or unspecified chronic kidney disease: Secondary | ICD-10-CM | POA: Diagnosis not present

## 2020-09-28 DIAGNOSIS — J449 Chronic obstructive pulmonary disease, unspecified: Secondary | ICD-10-CM | POA: Diagnosis not present

## 2020-09-28 DIAGNOSIS — M1990 Unspecified osteoarthritis, unspecified site: Secondary | ICD-10-CM | POA: Diagnosis not present

## 2020-09-28 DIAGNOSIS — G8929 Other chronic pain: Secondary | ICD-10-CM | POA: Diagnosis not present

## 2020-09-28 DIAGNOSIS — M810 Age-related osteoporosis without current pathological fracture: Secondary | ICD-10-CM | POA: Diagnosis not present

## 2020-09-28 DIAGNOSIS — N184 Chronic kidney disease, stage 4 (severe): Secondary | ICD-10-CM | POA: Diagnosis not present

## 2020-09-28 DIAGNOSIS — F419 Anxiety disorder, unspecified: Secondary | ICD-10-CM | POA: Diagnosis not present

## 2020-09-28 DIAGNOSIS — F325 Major depressive disorder, single episode, in full remission: Secondary | ICD-10-CM | POA: Diagnosis not present

## 2020-09-28 DIAGNOSIS — I2510 Atherosclerotic heart disease of native coronary artery without angina pectoris: Secondary | ICD-10-CM | POA: Diagnosis not present

## 2020-09-29 DIAGNOSIS — H35033 Hypertensive retinopathy, bilateral: Secondary | ICD-10-CM | POA: Diagnosis not present

## 2020-09-29 DIAGNOSIS — E7800 Pure hypercholesterolemia, unspecified: Secondary | ICD-10-CM | POA: Diagnosis not present

## 2020-09-29 DIAGNOSIS — E039 Hypothyroidism, unspecified: Secondary | ICD-10-CM | POA: Diagnosis not present

## 2020-09-29 DIAGNOSIS — M810 Age-related osteoporosis without current pathological fracture: Secondary | ICD-10-CM | POA: Diagnosis not present

## 2020-09-29 DIAGNOSIS — N390 Urinary tract infection, site not specified: Secondary | ICD-10-CM | POA: Diagnosis not present

## 2020-09-29 DIAGNOSIS — N184 Chronic kidney disease, stage 4 (severe): Secondary | ICD-10-CM | POA: Diagnosis not present

## 2020-09-29 DIAGNOSIS — F324 Major depressive disorder, single episode, in partial remission: Secondary | ICD-10-CM | POA: Diagnosis not present

## 2020-09-29 DIAGNOSIS — J449 Chronic obstructive pulmonary disease, unspecified: Secondary | ICD-10-CM | POA: Diagnosis not present

## 2020-09-29 DIAGNOSIS — I129 Hypertensive chronic kidney disease with stage 1 through stage 4 chronic kidney disease, or unspecified chronic kidney disease: Secondary | ICD-10-CM | POA: Diagnosis not present

## 2020-10-06 DIAGNOSIS — N184 Chronic kidney disease, stage 4 (severe): Secondary | ICD-10-CM | POA: Diagnosis not present

## 2020-10-06 DIAGNOSIS — F325 Major depressive disorder, single episode, in full remission: Secondary | ICD-10-CM | POA: Diagnosis not present

## 2020-10-06 DIAGNOSIS — I2510 Atherosclerotic heart disease of native coronary artery without angina pectoris: Secondary | ICD-10-CM | POA: Diagnosis not present

## 2020-10-06 DIAGNOSIS — M1990 Unspecified osteoarthritis, unspecified site: Secondary | ICD-10-CM | POA: Diagnosis not present

## 2020-10-06 DIAGNOSIS — F419 Anxiety disorder, unspecified: Secondary | ICD-10-CM | POA: Diagnosis not present

## 2020-10-06 DIAGNOSIS — J449 Chronic obstructive pulmonary disease, unspecified: Secondary | ICD-10-CM | POA: Diagnosis not present

## 2020-10-06 DIAGNOSIS — I129 Hypertensive chronic kidney disease with stage 1 through stage 4 chronic kidney disease, or unspecified chronic kidney disease: Secondary | ICD-10-CM | POA: Diagnosis not present

## 2020-10-06 DIAGNOSIS — M810 Age-related osteoporosis without current pathological fracture: Secondary | ICD-10-CM | POA: Diagnosis not present

## 2020-10-06 DIAGNOSIS — G8929 Other chronic pain: Secondary | ICD-10-CM | POA: Diagnosis not present

## 2020-10-12 DIAGNOSIS — F325 Major depressive disorder, single episode, in full remission: Secondary | ICD-10-CM | POA: Diagnosis not present

## 2020-10-12 DIAGNOSIS — I2510 Atherosclerotic heart disease of native coronary artery without angina pectoris: Secondary | ICD-10-CM | POA: Diagnosis not present

## 2020-10-12 DIAGNOSIS — N184 Chronic kidney disease, stage 4 (severe): Secondary | ICD-10-CM | POA: Diagnosis not present

## 2020-10-12 DIAGNOSIS — I129 Hypertensive chronic kidney disease with stage 1 through stage 4 chronic kidney disease, or unspecified chronic kidney disease: Secondary | ICD-10-CM | POA: Diagnosis not present

## 2020-10-12 DIAGNOSIS — F419 Anxiety disorder, unspecified: Secondary | ICD-10-CM | POA: Diagnosis not present

## 2020-10-12 DIAGNOSIS — G8929 Other chronic pain: Secondary | ICD-10-CM | POA: Diagnosis not present

## 2020-10-12 DIAGNOSIS — M810 Age-related osteoporosis without current pathological fracture: Secondary | ICD-10-CM | POA: Diagnosis not present

## 2020-10-12 DIAGNOSIS — M1990 Unspecified osteoarthritis, unspecified site: Secondary | ICD-10-CM | POA: Diagnosis not present

## 2020-10-12 DIAGNOSIS — J449 Chronic obstructive pulmonary disease, unspecified: Secondary | ICD-10-CM | POA: Diagnosis not present

## 2020-10-18 DIAGNOSIS — N2581 Secondary hyperparathyroidism of renal origin: Secondary | ICD-10-CM | POA: Diagnosis not present

## 2020-10-18 DIAGNOSIS — N184 Chronic kidney disease, stage 4 (severe): Secondary | ICD-10-CM | POA: Diagnosis not present

## 2020-10-18 DIAGNOSIS — I129 Hypertensive chronic kidney disease with stage 1 through stage 4 chronic kidney disease, or unspecified chronic kidney disease: Secondary | ICD-10-CM | POA: Diagnosis not present

## 2020-10-18 DIAGNOSIS — D631 Anemia in chronic kidney disease: Secondary | ICD-10-CM | POA: Diagnosis not present

## 2020-10-18 DIAGNOSIS — Z940 Kidney transplant status: Secondary | ICD-10-CM | POA: Diagnosis not present

## 2020-10-21 DIAGNOSIS — E7800 Pure hypercholesterolemia, unspecified: Secondary | ICD-10-CM | POA: Diagnosis not present

## 2020-10-21 DIAGNOSIS — I129 Hypertensive chronic kidney disease with stage 1 through stage 4 chronic kidney disease, or unspecified chronic kidney disease: Secondary | ICD-10-CM | POA: Diagnosis not present

## 2020-10-21 DIAGNOSIS — Z0000 Encounter for general adult medical examination without abnormal findings: Secondary | ICD-10-CM | POA: Diagnosis not present

## 2020-10-21 DIAGNOSIS — J449 Chronic obstructive pulmonary disease, unspecified: Secondary | ICD-10-CM | POA: Diagnosis not present

## 2020-10-21 DIAGNOSIS — R7309 Other abnormal glucose: Secondary | ICD-10-CM | POA: Diagnosis not present

## 2020-10-21 DIAGNOSIS — E039 Hypothyroidism, unspecified: Secondary | ICD-10-CM | POA: Diagnosis not present

## 2020-10-21 DIAGNOSIS — N184 Chronic kidney disease, stage 4 (severe): Secondary | ICD-10-CM | POA: Diagnosis not present

## 2020-10-21 DIAGNOSIS — D692 Other nonthrombocytopenic purpura: Secondary | ICD-10-CM | POA: Diagnosis not present

## 2020-10-26 DIAGNOSIS — H6123 Impacted cerumen, bilateral: Secondary | ICD-10-CM | POA: Diagnosis not present

## 2020-11-01 DIAGNOSIS — J449 Chronic obstructive pulmonary disease, unspecified: Secondary | ICD-10-CM | POA: Diagnosis not present

## 2020-11-01 DIAGNOSIS — M810 Age-related osteoporosis without current pathological fracture: Secondary | ICD-10-CM | POA: Diagnosis not present

## 2020-11-01 DIAGNOSIS — E039 Hypothyroidism, unspecified: Secondary | ICD-10-CM | POA: Diagnosis not present

## 2020-11-01 DIAGNOSIS — F324 Major depressive disorder, single episode, in partial remission: Secondary | ICD-10-CM | POA: Diagnosis not present

## 2020-11-01 DIAGNOSIS — E7800 Pure hypercholesterolemia, unspecified: Secondary | ICD-10-CM | POA: Diagnosis not present

## 2020-11-01 DIAGNOSIS — N184 Chronic kidney disease, stage 4 (severe): Secondary | ICD-10-CM | POA: Diagnosis not present

## 2020-11-01 DIAGNOSIS — H35033 Hypertensive retinopathy, bilateral: Secondary | ICD-10-CM | POA: Diagnosis not present

## 2020-11-01 DIAGNOSIS — I129 Hypertensive chronic kidney disease with stage 1 through stage 4 chronic kidney disease, or unspecified chronic kidney disease: Secondary | ICD-10-CM | POA: Diagnosis not present

## 2020-11-29 DIAGNOSIS — H35033 Hypertensive retinopathy, bilateral: Secondary | ICD-10-CM | POA: Diagnosis not present

## 2020-11-29 DIAGNOSIS — F324 Major depressive disorder, single episode, in partial remission: Secondary | ICD-10-CM | POA: Diagnosis not present

## 2020-11-29 DIAGNOSIS — J449 Chronic obstructive pulmonary disease, unspecified: Secondary | ICD-10-CM | POA: Diagnosis not present

## 2020-11-29 DIAGNOSIS — I129 Hypertensive chronic kidney disease with stage 1 through stage 4 chronic kidney disease, or unspecified chronic kidney disease: Secondary | ICD-10-CM | POA: Diagnosis not present

## 2020-11-29 DIAGNOSIS — M810 Age-related osteoporosis without current pathological fracture: Secondary | ICD-10-CM | POA: Diagnosis not present

## 2020-11-29 DIAGNOSIS — E7800 Pure hypercholesterolemia, unspecified: Secondary | ICD-10-CM | POA: Diagnosis not present

## 2020-11-29 DIAGNOSIS — N184 Chronic kidney disease, stage 4 (severe): Secondary | ICD-10-CM | POA: Diagnosis not present

## 2020-11-29 DIAGNOSIS — E039 Hypothyroidism, unspecified: Secondary | ICD-10-CM | POA: Diagnosis not present

## 2020-12-19 ENCOUNTER — Inpatient Hospital Stay (HOSPITAL_COMMUNITY)
Admission: EM | Admit: 2020-12-19 | Discharge: 2020-12-22 | DRG: 640 | Disposition: A | Discharge status: Skilled Nursing Facility | Payer: Medicare PPO | Attending: Internal Medicine | Admitting: Internal Medicine

## 2020-12-19 ENCOUNTER — Encounter (HOSPITAL_COMMUNITY): Payer: Self-pay | Admitting: Internal Medicine

## 2020-12-19 ENCOUNTER — Other Ambulatory Visit: Payer: Self-pay

## 2020-12-19 ENCOUNTER — Emergency Department (HOSPITAL_COMMUNITY): Payer: Medicare PPO

## 2020-12-19 DIAGNOSIS — I10 Essential (primary) hypertension: Secondary | ICD-10-CM | POA: Diagnosis present

## 2020-12-19 DIAGNOSIS — E785 Hyperlipidemia, unspecified: Secondary | ICD-10-CM | POA: Diagnosis present

## 2020-12-19 DIAGNOSIS — I129 Hypertensive chronic kidney disease with stage 1 through stage 4 chronic kidney disease, or unspecified chronic kidney disease: Secondary | ICD-10-CM | POA: Diagnosis not present

## 2020-12-19 DIAGNOSIS — Z940 Kidney transplant status: Secondary | ICD-10-CM | POA: Diagnosis not present

## 2020-12-19 DIAGNOSIS — F0390 Unspecified dementia without behavioral disturbance: Secondary | ICD-10-CM | POA: Diagnosis present

## 2020-12-19 DIAGNOSIS — E46 Unspecified protein-calorie malnutrition: Secondary | ICD-10-CM | POA: Insufficient documentation

## 2020-12-19 DIAGNOSIS — E039 Hypothyroidism, unspecified: Secondary | ICD-10-CM | POA: Diagnosis present

## 2020-12-19 DIAGNOSIS — R1312 Dysphagia, oropharyngeal phase: Secondary | ICD-10-CM | POA: Diagnosis not present

## 2020-12-19 DIAGNOSIS — Z86718 Personal history of other venous thrombosis and embolism: Secondary | ICD-10-CM | POA: Diagnosis not present

## 2020-12-19 DIAGNOSIS — J449 Chronic obstructive pulmonary disease, unspecified: Secondary | ICD-10-CM | POA: Diagnosis present

## 2020-12-19 DIAGNOSIS — E1122 Type 2 diabetes mellitus with diabetic chronic kidney disease: Secondary | ICD-10-CM

## 2020-12-19 DIAGNOSIS — I130 Hypertensive heart and chronic kidney disease with heart failure and stage 1 through stage 4 chronic kidney disease, or unspecified chronic kidney disease: Secondary | ICD-10-CM | POA: Diagnosis present

## 2020-12-19 DIAGNOSIS — F419 Anxiety disorder, unspecified: Secondary | ICD-10-CM | POA: Diagnosis present

## 2020-12-19 DIAGNOSIS — Z515 Encounter for palliative care: Secondary | ICD-10-CM | POA: Diagnosis not present

## 2020-12-19 DIAGNOSIS — U071 COVID-19: Secondary | ICD-10-CM | POA: Diagnosis present

## 2020-12-19 DIAGNOSIS — Z7901 Long term (current) use of anticoagulants: Secondary | ICD-10-CM

## 2020-12-19 DIAGNOSIS — I2510 Atherosclerotic heart disease of native coronary artery without angina pectoris: Secondary | ICD-10-CM | POA: Diagnosis present

## 2020-12-19 DIAGNOSIS — J42 Unspecified chronic bronchitis: Secondary | ICD-10-CM | POA: Diagnosis not present

## 2020-12-19 DIAGNOSIS — K219 Gastro-esophageal reflux disease without esophagitis: Secondary | ICD-10-CM | POA: Diagnosis present

## 2020-12-19 DIAGNOSIS — Z880 Allergy status to penicillin: Secondary | ICD-10-CM

## 2020-12-19 DIAGNOSIS — R0902 Hypoxemia: Secondary | ICD-10-CM | POA: Diagnosis not present

## 2020-12-19 DIAGNOSIS — I825Y2 Chronic embolism and thrombosis of unspecified deep veins of left proximal lower extremity: Secondary | ICD-10-CM | POA: Diagnosis not present

## 2020-12-19 DIAGNOSIS — Z20818 Contact with and (suspected) exposure to other bacterial communicable diseases: Secondary | ICD-10-CM | POA: Diagnosis not present

## 2020-12-19 DIAGNOSIS — R000 Tachycardia, unspecified: Secondary | ICD-10-CM | POA: Diagnosis not present

## 2020-12-19 DIAGNOSIS — Z66 Do not resuscitate: Secondary | ICD-10-CM | POA: Diagnosis present

## 2020-12-19 DIAGNOSIS — Z681 Body mass index (BMI) 19 or less, adult: Secondary | ICD-10-CM

## 2020-12-19 DIAGNOSIS — R5381 Other malaise: Secondary | ICD-10-CM | POA: Diagnosis not present

## 2020-12-19 DIAGNOSIS — Z7401 Bed confinement status: Secondary | ICD-10-CM | POA: Diagnosis not present

## 2020-12-19 DIAGNOSIS — N179 Acute kidney failure, unspecified: Secondary | ICD-10-CM | POA: Diagnosis not present

## 2020-12-19 DIAGNOSIS — F32A Depression, unspecified: Secondary | ICD-10-CM | POA: Diagnosis present

## 2020-12-19 DIAGNOSIS — M2550 Pain in unspecified joint: Secondary | ICD-10-CM | POA: Diagnosis not present

## 2020-12-19 DIAGNOSIS — Z7952 Long term (current) use of systemic steroids: Secondary | ICD-10-CM

## 2020-12-19 DIAGNOSIS — E43 Unspecified severe protein-calorie malnutrition: Secondary | ICD-10-CM | POA: Insufficient documentation

## 2020-12-19 DIAGNOSIS — Z7951 Long term (current) use of inhaled steroids: Secondary | ICD-10-CM

## 2020-12-19 DIAGNOSIS — Z993 Dependence on wheelchair: Secondary | ICD-10-CM | POA: Diagnosis not present

## 2020-12-19 DIAGNOSIS — Z7189 Other specified counseling: Secondary | ICD-10-CM | POA: Diagnosis not present

## 2020-12-19 DIAGNOSIS — R4182 Altered mental status, unspecified: Secondary | ICD-10-CM | POA: Diagnosis not present

## 2020-12-19 DIAGNOSIS — Z96641 Presence of right artificial hip joint: Secondary | ICD-10-CM | POA: Diagnosis present

## 2020-12-19 DIAGNOSIS — Z885 Allergy status to narcotic agent status: Secondary | ICD-10-CM

## 2020-12-19 DIAGNOSIS — N184 Chronic kidney disease, stage 4 (severe): Secondary | ICD-10-CM | POA: Diagnosis present

## 2020-12-19 DIAGNOSIS — D638 Anemia in other chronic diseases classified elsewhere: Secondary | ICD-10-CM | POA: Diagnosis present

## 2020-12-19 DIAGNOSIS — D631 Anemia in chronic kidney disease: Secondary | ICD-10-CM | POA: Diagnosis not present

## 2020-12-19 DIAGNOSIS — Z79899 Other long term (current) drug therapy: Secondary | ICD-10-CM

## 2020-12-19 DIAGNOSIS — R627 Adult failure to thrive: Secondary | ICD-10-CM | POA: Diagnosis present

## 2020-12-19 DIAGNOSIS — E860 Dehydration: Principal | ICD-10-CM | POA: Diagnosis present

## 2020-12-19 DIAGNOSIS — E119 Type 2 diabetes mellitus without complications: Secondary | ICD-10-CM

## 2020-12-19 DIAGNOSIS — R2681 Unsteadiness on feet: Secondary | ICD-10-CM | POA: Diagnosis not present

## 2020-12-19 DIAGNOSIS — Z9049 Acquired absence of other specified parts of digestive tract: Secondary | ICD-10-CM

## 2020-12-19 DIAGNOSIS — E876 Hypokalemia: Secondary | ICD-10-CM | POA: Diagnosis not present

## 2020-12-19 DIAGNOSIS — I5032 Chronic diastolic (congestive) heart failure: Secondary | ICD-10-CM | POA: Diagnosis present

## 2020-12-19 DIAGNOSIS — Z888 Allergy status to other drugs, medicaments and biological substances status: Secondary | ICD-10-CM

## 2020-12-19 DIAGNOSIS — M6281 Muscle weakness (generalized): Secondary | ICD-10-CM | POA: Diagnosis not present

## 2020-12-19 LAB — CBC
HCT: 42.1 % (ref 36.0–46.0)
Hemoglobin: 14.2 g/dL (ref 12.0–15.0)
MCH: 32.4 pg (ref 26.0–34.0)
MCHC: 33.7 g/dL (ref 30.0–36.0)
MCV: 96.1 fL (ref 80.0–100.0)
Platelets: 307 10*3/uL (ref 150–400)
RBC: 4.38 MIL/uL (ref 3.87–5.11)
RDW: 13.4 % (ref 11.5–15.5)
WBC: 11.1 10*3/uL — ABNORMAL HIGH (ref 4.0–10.5)
nRBC: 0 % (ref 0.0–0.2)

## 2020-12-19 LAB — URINALYSIS, ROUTINE W REFLEX MICROSCOPIC
Bilirubin Urine: NEGATIVE
Glucose, UA: NEGATIVE mg/dL
Ketones, ur: NEGATIVE mg/dL
Nitrite: NEGATIVE
Protein, ur: 100 mg/dL — AB
Specific Gravity, Urine: 1.013 (ref 1.005–1.030)
pH: 5 (ref 5.0–8.0)

## 2020-12-19 LAB — BASIC METABOLIC PANEL
Anion gap: 16 — ABNORMAL HIGH (ref 5–15)
BUN: 51 mg/dL — ABNORMAL HIGH (ref 8–23)
CO2: 16 mmol/L — ABNORMAL LOW (ref 22–32)
Calcium: 10.3 mg/dL (ref 8.9–10.3)
Chloride: 111 mmol/L (ref 98–111)
Creatinine, Ser: 2.16 mg/dL — ABNORMAL HIGH (ref 0.44–1.00)
GFR, Estimated: 22 mL/min — ABNORMAL LOW (ref >60)
Glucose, Bld: 180 mg/dL — ABNORMAL HIGH (ref 70–99)
Potassium: 3.7 mmol/L (ref 3.5–5.1)
Sodium: 143 mmol/L (ref 135–145)

## 2020-12-19 LAB — FERRITIN: Ferritin: 447 ng/mL — ABNORMAL HIGH (ref 11–307)

## 2020-12-19 LAB — HEPATIC FUNCTION PANEL
ALT: 8 U/L (ref 0–44)
AST: 27 U/L (ref 15–41)
Albumin: 3.2 g/dL — ABNORMAL LOW (ref 3.5–5.0)
Alkaline Phosphatase: 74 U/L (ref 38–126)
Bilirubin, Direct: 0.5 mg/dL — ABNORMAL HIGH (ref 0.0–0.2)
Indirect Bilirubin: 1 mg/dL — ABNORMAL HIGH (ref 0.3–0.9)
Total Bilirubin: 1.5 mg/dL — ABNORMAL HIGH (ref 0.3–1.2)
Total Protein: 8 g/dL (ref 6.5–8.1)

## 2020-12-19 LAB — PROCALCITONIN: Procalcitonin: 0.71 ng/mL

## 2020-12-19 LAB — C-REACTIVE PROTEIN: CRP: 8 mg/dL — ABNORMAL HIGH (ref <1.0)

## 2020-12-19 LAB — D-DIMER, QUANTITATIVE: D-Dimer, Quant: 3.69 ug/mL-FEU — ABNORMAL HIGH (ref 0.00–0.50)

## 2020-12-19 LAB — TSH: TSH: 0.587 u[IU]/mL (ref 0.350–4.500)

## 2020-12-19 LAB — LIPASE, BLOOD: Lipase: 42 U/L (ref 11–51)

## 2020-12-19 LAB — LACTATE DEHYDROGENASE: LDH: 154 U/L (ref 98–192)

## 2020-12-19 LAB — RESP PANEL BY RT-PCR (FLU A&B, COVID) ARPGX2
Influenza A by PCR: NEGATIVE
Influenza B by PCR: NEGATIVE
SARS Coronavirus 2 by RT PCR: POSITIVE — AB

## 2020-12-19 LAB — FIBRINOGEN: Fibrinogen: 772 mg/dL — ABNORMAL HIGH (ref 210–475)

## 2020-12-19 MED ORDER — SODIUM CHLORIDE 0.9 % IV BOLUS
1000.0000 mL | Freq: Once | INTRAVENOUS | Status: AC
  Administered 2020-12-19: 1000 mL via INTRAVENOUS

## 2020-12-19 MED ORDER — ACETAMINOPHEN 650 MG RE SUPP: 650.0000 mg | Freq: Four times a day (QID) | RECTAL | Status: DC | PRN

## 2020-12-19 MED ORDER — PRAVASTATIN SODIUM 40 MG PO TABS: 40.0000 mg | ORAL_TABLET | Freq: Every day | ORAL | Status: DC

## 2020-12-19 MED ORDER — MIRTAZAPINE 7.5 MG PO TABS: 7.5000 mg | ORAL_TABLET | Freq: Every day | ORAL | Status: DC

## 2020-12-19 MED ORDER — ONDANSETRON HCL 4 MG PO TABS: 4.0000 mg | ORAL_TABLET | Freq: Four times a day (QID) | ORAL | Status: DC | PRN

## 2020-12-19 MED ORDER — POLYETHYLENE GLYCOL 3350 17 G PO PACK: 17.0000 g | PACK | Freq: Every day | ORAL | Status: DC | PRN

## 2020-12-19 MED ORDER — AZATHIOPRINE 50 MG PO TABS
50.0000 mg | ORAL_TABLET | Freq: Two times a day (BID) | ORAL | Status: DC
  Administered 2020-12-20 – 2020-12-22 (×5): 50 mg via ORAL
  Filled 2020-12-19 (×7): qty 1

## 2020-12-19 MED ORDER — HYDRALAZINE HCL 20 MG/ML IJ SOLN
5.0000 mg | INTRAMUSCULAR | Status: DC | PRN
  Administered 2020-12-20: 5 mg via INTRAVENOUS
  Filled 2020-12-19: qty 1

## 2020-12-19 MED ORDER — BISACODYL 5 MG PO TBEC: 5.0000 mg | DELAYED_RELEASE_TABLET | Freq: Every day | ORAL | Status: DC | PRN

## 2020-12-19 MED ORDER — SULFAMETHOXAZOLE-TRIMETHOPRIM 400-80 MG PO TABS
1.0000 | ORAL_TABLET | ORAL | Status: DC
  Administered 2020-12-21: 1 via ORAL
  Filled 2020-12-19: qty 1

## 2020-12-19 MED ORDER — ONDANSETRON HCL 4 MG/2ML IJ SOLN: 4.0000 mg | Freq: Four times a day (QID) | INTRAMUSCULAR | Status: DC | PRN

## 2020-12-19 MED ORDER — SODIUM CHLORIDE 0.9 % IV SOLN
100.0000 mg | Freq: Every day | INTRAVENOUS | Status: AC
  Administered 2020-12-20 – 2020-12-21 (×2): 100 mg via INTRAVENOUS
  Filled 2020-12-19 (×2): qty 20

## 2020-12-19 MED ORDER — LEVOTHYROXINE SODIUM 75 MCG PO TABS
75.0000 ug | ORAL_TABLET | Freq: Every day | ORAL | Status: DC
  Administered 2020-12-21 – 2020-12-22 (×2): 75 ug via ORAL
  Filled 2020-12-19 (×2): qty 1

## 2020-12-19 MED ORDER — HYDROCORTISONE NA SUCCINATE PF 100 MG IJ SOLR
50.0000 mg | Freq: Four times a day (QID) | INTRAMUSCULAR | Status: DC
  Administered 2020-12-19 – 2020-12-20 (×4): 50 mg via INTRAVENOUS
  Filled 2020-12-19 (×4): qty 2

## 2020-12-19 MED ORDER — ALBUTEROL SULFATE HFA 108 (90 BASE) MCG/ACT IN AERS
2.0000 | INHALATION_SPRAY | Freq: Four times a day (QID) | RESPIRATORY_TRACT | Status: DC | PRN
  Filled 2020-12-19: qty 6.7

## 2020-12-19 MED ORDER — MIRABEGRON ER 50 MG PO TB24
50.0000 mg | ORAL_TABLET | Freq: Every day | ORAL | Status: DC
  Administered 2020-12-20 – 2020-12-22 (×3): 50 mg via ORAL
  Filled 2020-12-19 (×3): qty 1

## 2020-12-19 MED ORDER — ACETAMINOPHEN 325 MG PO TABS
650.0000 mg | ORAL_TABLET | Freq: Four times a day (QID) | ORAL | Status: DC | PRN
  Filled 2020-12-19: qty 2

## 2020-12-19 MED ORDER — SODIUM CHLORIDE 0.9% FLUSH
3.0000 mL | Freq: Two times a day (BID) | INTRAVENOUS | Status: DC
  Administered 2020-12-19 – 2020-12-22 (×6): 3 mL via INTRAVENOUS

## 2020-12-19 MED ORDER — FLUOXETINE HCL 20 MG PO CAPS
20.0000 mg | ORAL_CAPSULE | Freq: Every day | ORAL | Status: DC
  Administered 2020-12-20 – 2020-12-22 (×3): 20 mg via ORAL
  Filled 2020-12-19 (×3): qty 1

## 2020-12-19 MED ORDER — DOCUSATE SODIUM 100 MG PO CAPS
100.0000 mg | ORAL_CAPSULE | Freq: Two times a day (BID) | ORAL | Status: DC
  Administered 2020-12-20 – 2020-12-22 (×5): 100 mg via ORAL
  Filled 2020-12-19 (×6): qty 1

## 2020-12-19 MED ORDER — LACTATED RINGERS IV SOLN
INTRAVENOUS | Status: DC
  Administered 2020-12-19: 75 mL/h via INTRAVENOUS

## 2020-12-19 MED ORDER — SODIUM CHLORIDE 0.9 % IV SOLN
200.0000 mg | Freq: Once | INTRAVENOUS | Status: AC
  Administered 2020-12-19: 200 mg via INTRAVENOUS
  Filled 2020-12-19: qty 40

## 2020-12-19 MED ORDER — CLONIDINE HCL 0.1 MG PO TABS
0.1000 mg | ORAL_TABLET | Freq: Two times a day (BID) | ORAL | Status: DC
  Administered 2020-12-20 – 2020-12-22 (×5): 0.1 mg via ORAL
  Filled 2020-12-19 (×6): qty 1

## 2020-12-19 MED ORDER — FLUTICASONE FUROATE-VILANTEROL 200-25 MCG/INH IN AEPB
1.0000 | INHALATION_SPRAY | Freq: Every day | RESPIRATORY_TRACT | Status: DC
  Administered 2020-12-20 – 2020-12-22 (×3): 1 via RESPIRATORY_TRACT
  Filled 2020-12-19: qty 28

## 2020-12-19 MED ORDER — APIXABAN 2.5 MG PO TABS
2.5000 mg | ORAL_TABLET | Freq: Two times a day (BID) | ORAL | Status: DC
  Administered 2020-12-20 – 2020-12-22 (×5): 2.5 mg via ORAL
  Filled 2020-12-19 (×7): qty 1

## 2020-12-19 MED ORDER — CYCLOSPORINE MODIFIED (NEORAL) 100 MG PO CAPS
100.0000 mg | ORAL_CAPSULE | Freq: Two times a day (BID) | ORAL | Status: DC
  Administered 2020-12-20 – 2020-12-22 (×5): 100 mg via ORAL
  Filled 2020-12-19 (×7): qty 1

## 2020-12-19 NOTE — ED Provider Notes (Addendum)
[facility]Y DEPARTMENT Provider Note   CSN: [id number] Arrival date & time: 12/19/20  1119     History Chief Complaint  Patient presents with  . Failure To Thrive    Patricia [name] is a 87 y.o. female.  Level 5 caveat due to memory issues.  History is limited.  The history is provided by the patient.  Illness Location:  General Severity:  Mild Onset quality:  Gradual Timing:  Constant Progression:  Worsening Chronicity:  New Context:  Husband concern for dehydration, not eating or drinking recently. Weakner than normal. No GI symptoms. Pt with memory issues. Wheelchair bound. No falls. Relieved by:  Nothing Worsened by:  Nothing      Past Medical History:  Diagnosis Date  . Abnormal gait   . Anxiety   . Arthritis   . Asthma   . COPD, mild ([place])   . Coronary artery disease    pt denies  . Depression   . Diverticulitis   . Dizziness   . DVT (deep venous thrombosis) ([place])   . GERD (gastroesophageal reflux disease)   . Hypertension   . Hypothyroidism   . Multiple allergies   . Pneumonia   . Pre-diabetes   . Reflux   . Renal disorder    had kidney transplant in 2009    Patient Active Problem List   Diagnosis Date Noted  . Dehydration   . Failure to thrive in adult   . Palliative care by specialist   . Goals of care, counseling/discussion   . Pain in both upper extremities 04/11/2020  . History of anemia due to CKD 12/31/2019  . Anemia in chronic kidney disease (CKD) 12/31/2019  . Generalized weakness 12/30/2019  . Cholecystitis with cholelithiasis 07/01/2018  . Postoperative anemia due to acute blood loss 02/15/2016  . Hyperlipidemia 02/15/2016  . Displaced fracture of right femoral neck ([place]) 02/10/2016  . Chronic diastolic CHF (congestive heart failure) ([place]) 02/08/2016  . Closed right hip fracture ([place]) 02/08/2016  . Hip fracture ([place]) 02/08/2016  . Acute diastolic CHF (congestive heart failure) ([place]) 05/27/2015  .  Urinary incontinence 05/27/2015  . Escherichia coli urinary tract infection   . Other emphysema ([place])   . Hypokalemia   . Hypomagnesemia   . Sepsis secondary to UTI ([place]) 05/22/2015  . DVT, lower extremity, proximal ([place]) 03/02/2014  . Chronic kidney disease, stage 4, severely decreased GFR (HCC) 03/02/2014  . Essential hypertension 03/02/2014  . DVT (deep venous thrombosis) ([place]) 03/01/2014  . Acute renal failure superimposed on stage 3 chronic kidney disease ([place]) 03/01/2014  . Gastroenteritis 11/22/2013  . Nausea & vomiting 11/22/2013  . Hypercalcemia 11/22/2013  . Infection due to ESBL-producing Escherichia coli 10/07/2013  . Septicemia due to E. coli ([place]) 10/07/2013  . Urinary tract infection 10/05/2013  . Sepsis ([place]) 10/05/2013  . History of renal transplant   . Hypertension   . Thyroid disease   . Multiple allergies   . Abnormal gait   . Reflux   . Diverticulitis   . Hypothyroidism   . Depression   . Anxiety   . COPD (chronic obstructive pulmonary disease) ([place])   . Dizziness   . Diabetes type 2, controlled [facility])     Past Surgical History:  Procedure Laterality Date  . ANTERIOR APPROACH HEMI HIP ARTHROPLASTY Right 02/10/2016   Procedure: TOTAL HIP ARTHROPLASTY ANTERIOR APPROACH;  Surgeon: Rod Can, MD;  Location: [facility];  Service: Orthopedics;  Laterality: Right;  . CHOLECYSTECTOMY N/A  07/01/2018   Procedure: LAPAROSCOPIC CHOLECYSTECTOMY WITH INTRAOPERATIVE CHOLANGIOGRAM;  Surgeon: Jovita Kussmaul, MD;  Location: [facility];  Service: General;  Laterality: N/A;  . COLONOSCOPY    . NEPHRECTOMY TRANSPLANTED ORGAN    . TUMOR REMOVAL     Brain      OB History   No obstetric history on file.     Family History  Problem Relation Age of Onset  . Alzheimer's disease Mother   . High Cholesterol Mother   . Alcoholism Father     Social History   Tobacco Use  . Smoking status: Never Smoker  . Smokeless tobacco: Never Used  Vaping Use  . Vaping Use: Never used   Substance Use Topics  . Alcohol use: No  . Drug use: No    Home Medications Prior to Admission medications   Medication Sig Start Date End Date Taking? Authorizing Provider  acetaminophen (TYLENOL) 325 MG tablet Take 2 tablets (650 mg total) by mouth every 6 (six) hours as needed. 04/27/20   Ngetich, Dinah C, NP  albuterol (VENTOLIN HFA) 108 (90 Base) MCG/ACT inhaler Inhale 2 puffs into the lungs every 6 (six) hours as needed for wheezing or shortness of breath. 04/27/20   Ngetich, Dinah C, NP  apixaban (ELIQUIS) 2.5 MG TABS tablet Take 1 tablet (2.5 mg total) by mouth 2 (two) times daily. 04/27/20   Ngetich, Dinah C, NP  azaTHIOprine (IMURAN) 50 MG tablet Take 1 tablet (50 mg total) by mouth 2 (two) times daily. 04/27/20   Ngetich, Dinah C, NP  b complex vitamins tablet Take 1 tablet by mouth daily. 04/27/20   Ngetich, Dinah C, NP  bisacodyl (DULCOLAX) 5 MG EC tablet Take 1 tablet (5 mg total) by mouth daily as needed for moderate constipation. 04/27/20   Ngetich, Dinah C, NP  budesonide-formoterol (SYMBICORT) 160-4.5 MCG/ACT inhaler Inhale 2 puffs into the lungs in the morning and at bedtime. 04/27/20   Ngetich, Dinah C, NP  cetirizine (ZYRTEC) 10 MG tablet Take 1 tablet (10 mg total) by mouth daily. 04/27/20   Ngetich, Dinah C, NP  Cholecalciferol (VITAMIN D) 50 MCG (2000 UT) tablet Take 1 tablet (2,000 Units total) by mouth every evening. 04/27/20   Ngetich, Dinah C, NP  cycloSPORINE modified (NEORAL) 100 MG capsule Take 1 capsule (100 mg total) by mouth 2 (two) times daily. 04/27/20   Ngetich, Dinah C, NP  DIPHENHYDRAMINE HCL, TOPICAL, (BENADRYL ITCH STOPPING) 2 % GEL Apply 1 application topically daily as needed for itch relief 04/27/20   Ngetich, Dinah C, NP  docusate sodium (COLACE) 100 MG capsule Take 1 capsule (100 mg total) by mouth 2 (two) times daily. 04/27/20   Ngetich, Dinah C, NP  famotidine (PEPCID) 20 MG tablet Take 1 tablet (20 mg total) by mouth daily with supper. 04/27/20   Ngetich,  Dinah C, NP  ferrous sulfate 325 (65 FE) MG tablet Take 1 tablet (325 mg total) by mouth daily with breakfast. 04/27/20   Ngetich, Dinah C, NP  FLUoxetine (PROZAC) 20 MG tablet Take 1 tablet (20 mg total) by mouth daily. 04/27/20   Ngetich, Dinah C, NP  fluticasone (FLONASE) 50 MCG/ACT nasal spray Place 1 spray into both nostrils daily as needed for allergies or rhinitis. 04/27/20   Ngetich, Dinah C, NP  Glucerna (GLUCERNA) LIQD Take 237 mLs by mouth 2 (two) times daily. 04/27/20   Ngetich, Dinah C, NP  levothyroxine (SYNTHROID) 75 MCG tablet Take 1 tablet (75 mcg total) by mouth daily. 04/27/20  Ngetich, Dinah C, NP  Lutein-Zeaxanthin 25-5 MG CAPS Take 1 capsule by mouth daily. 04/27/20   Ngetich, Dinah C, NP  metoCLOPramide (REGLAN) 5 MG tablet Take 1 tablet (5 mg total) by mouth 4 (four) times daily. 04/27/20   Ngetich, Dinah C, NP  mirabegron ER (MYRBETRIQ) 50 MG TB24 tablet Take 1 tablet (50 mg total) by mouth every evening. 04/27/20   Ngetich, Dinah C, NP  mirtazapine (REMERON) 15 MG tablet Take 0.5 tablets (7.5 mg total) by mouth at bedtime. 04/27/20   Ngetich, Dinah C, NP  Multiple Vitamin (MULTIVITAMIN WITH MINERALS) TABS tablet Take 1 tablet by mouth daily. 04/27/20   Ngetich, Dinah C, NP  omeprazole (PRILOSEC) 20 MG capsule Take 1 capsule (20 mg total) by mouth daily. 04/27/20   Ngetich, Dinah C, NP  ondansetron (ZOFRAN) 4 MG tablet Take 1 tablet (4 mg total) by mouth every 4 (four) hours as needed. 04/27/20   Ngetich, Dinah C, NP  oxybutynin (DITROPAN-XL) 10 MG 24 hr tablet Take 1 tablet (10 mg total) by mouth daily. 04/27/20   Ngetich, Dinah C, NP  phenylephrine-shark liver oil-mineral oil-petrolatum (PREPARATION H) 0.25-3-14-71.9 % rectal ointment Place 1 application rectally 2 (two) times daily as needed for hemorrhoids. 04/27/20   Ngetich, Dinah C, NP  polyethylene glycol (MIRALAX / GLYCOLAX) 17 g packet Take 17 g by mouth daily as needed for mild constipation. 04/27/20   Ngetich, Dinah C, NP   pravastatin (PRAVACHOL) 40 MG tablet Take 1 tablet (40 mg total) by mouth daily. 04/27/20   Ngetich, Dinah C, NP  predniSONE (DELTASONE) 5 MG tablet Take 1 tablet (5 mg total) by mouth daily with breakfast. 04/27/20   Ngetich, Dinah C, NP  sulfamethoxazole-trimethoprim (BACTRIM) 400-80 MG tablet Take 1 tablet by mouth every Monday, Wednesday, and Friday. 04/27/20   Ngetich, Dinah C, NP    Allergies    Macrolides and ketolides, Aricept [donepezil hcl], Codeine, Morphine, Penicillin g, Penicillins, Statins, Streptomycin, Tetracycline, and Tetracyclines & related  Review of Systems   Review of Systems  Unable to perform ROS: Other    Physical Exam Updated Vital Signs BP (!) 160/89   Pulse (!) 103   Temp 98.6 F (37 C) (Oral)   Resp (!) 21   SpO2 96%   Physical Exam Vitals and nursing note reviewed.  Constitutional:      General: She is not in acute distress.    Appearance: She is well-developed and well-nourished. She is cachectic. She is not ill-appearing.  HENT:     Head: Normocephalic and atraumatic.     Nose: Nose normal.     Mouth/Throat:     Mouth: Mucous membranes are dry.  Eyes:     Extraocular Movements: Extraocular movements intact.     Conjunctiva/sclera: Conjunctivae normal.     Pupils: Pupils are equal, round, and reactive to light.  Cardiovascular:     Rate and Rhythm: Normal rate and regular rhythm.     Pulses: Normal pulses.     Heart sounds: Normal heart sounds. No murmur heard.   Pulmonary:     Effort: Pulmonary effort is normal. No respiratory distress.     Breath sounds: Normal breath sounds.  Abdominal:     General: Abdomen is flat.     Palpations: Abdomen is soft.     Tenderness: There is no abdominal tenderness.  Musculoskeletal:        General: No edema.     Cervical back: Neck supple.  Skin:  General: Skin is warm and dry.     Capillary Refill: Capillary refill takes less than 2 seconds.  Neurological:     General: No focal deficit  present.     Mental Status: She is alert.     Comments: Patient moves all extremities, can answer yes/no questions  Psychiatric:        Mood and Affect: Mood and affect normal.     ED Results / Procedures / Treatments   Labs (all labs ordered are listed, but only abnormal results are displayed) Labs Reviewed  BASIC METABOLIC PANEL - Abnormal; Notable for the following components:      Result Value   CO2 16 (*)    Glucose, Bld 180 (*)    BUN 51 (*)    Creatinine, Ser 2.16 (*)    GFR, Estimated 22 (*)    Anion gap 16 (*)    All other components within normal limits  CBC - Abnormal; Notable for the following components:   WBC 11.1 (*)    All other components within normal limits  URINE CULTURE  RESP PANEL BY RT-PCR (FLU A&B, COVID) ARPGX2  URINALYSIS, ROUTINE W REFLEX MICROSCOPIC  HEPATIC FUNCTION PANEL  LIPASE, BLOOD  CBG MONITORING, ED    EKG EKG Interpretation  Date/Time:  Monday December 19 2020 11:37:09 EST Ventricular Rate:  117 PR Interval:  162 QRS Duration: 90 QT Interval:  326 QTC Calculation: 454 R Axis:   -68 Text Interpretation: Sinus tachycardia Right atrial enlargement Pulmonary disease pattern Left anterior fascicular block Left ventricular hypertrophy ( R in aVL , Cornell product , Romhilt-Estes ) Abnormal QRS-T angle, consider primary T wave abnormality Abnormal ECG Confirmed by Lennice Sites [phone]) on 12/19/2020 2:06:54 PM   Radiology DG Chest Portable 1 View  Result Date: 12/19/2020 CLINICAL DATA:  Altered mental status EXAM: PORTABLE CHEST 1 VIEW COMPARISON:  04/11/2020 FINDINGS: Hyperinflation. No new consolidation or edema. Left lung calcified granuloma is again identified. No pleural effusion. Stable cardiomediastinal contours with normal heart size. IMPRESSION: No acute process in the chest. Electronically Signed   By: Macy Mis M.D.   On: 12/19/2020 14:52    Procedures Procedures   Medications Ordered in ED Medications  sodium chloride  0.9 % bolus 1,000 mL (1,000 mLs Intravenous New Bag/Given 12/19/20 1451)    ED Course  I have reviewed the triage vital signs and the nursing notes.  Pertinent labs & imaging results that were available during my care of the patient were reviewed by me and considered in my medical decision making (see chart for details).    MDM Rules/Calculators/A&P                          Patricia [name] is an 87 year old female with history of hypertension, memory issues, DVT on Eliquis who presents the ED with generalized weakness, poor p.o. intake.  Patient with overall unremarkable vitals except for some mild tachycardia.  No fever.  Patient here with her husband who is primary caregiver.  She does have some caregivers that come by several days a week to help out with basic things.  Husband is concerned because she has not had much to eat or drink for days this has been progressively getting worse over the last several weeks.  He is concerned that she is in a diet she did not start eating.  Clinically she appears dehydrated on exam.  She has dry mucous membranes.  Neurologically she  appears intact.  She can tell me her name and answer simple yes or no questions.  Patient appears cachectic.  Denies any recent falls or traumas.  Patient is wheelchair-bound.  Could have a UTI or lung infection but suspect dehydration secondary to poor p.o. intake.  She has not had any nausea vomiting or diarrhea.  She denies any pain.  However history is limited to patient due to memory issues.  Lab work consistent with an AKI with creatinine of 2.16.  Bicarb is 16.  Likely in the setting of a starvation ketosis.  Will give fluid bolus.  Chest x-ray shows no pneumonia, no pneumothorax.  Will obtain urinalysis and hepatic function panel but suspect that patient has dehydration secondary to poor p.o. intake.  Will need hydration and likely nutritional support.  Will admit for further care.  Hemodynamically stable throughout my care.   EKG shows sinus rhythm.  No ischemic changes.  This chart was dictated using voice recognition software.  Despite best efforts to proofread,  errors can occur which can change the documentation meaning.    Final Clinical Impression(s) / ED Diagnoses Final diagnoses:  Dehydration  Failure to thrive in adult  AKI (acute kidney injury) [facility])    Rx / DC Orders ED Discharge Orders    None       Lennice Sites, DO 12/19/20 1500    Lennice Sites, DO 12/19/20 1500

## 2020-12-19 NOTE — Progress Notes (Signed)
Addendum:  Patient is COVID +.  This is presumed to be incidental infection.  I have notified her husband.  He is in agreement with giving 3 days of Remdesivir.  Will also order COVID labs.  CXR today was negative.   Carlyon Shadow, M.D.

## 2020-12-19 NOTE — ED Notes (Signed)
Dinner Tray Ordered @ 1700. 

## 2020-12-19 NOTE — Consult Note (Signed)
Consultation Note Date: 12/19/2020   Patient Name: Patricia [name]  DOB: 10-18-1931  MRN: [id number]  Age / Sex: 87 y.o., female  PCP: [name], MD Referring Physician: Karmen Bongo, MD  Reason for Consultation: Establishing goals of care  HPI/Patient Profile: 87 y.o. 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 In the last 3 weeks, she has refused to get out of bed and is not essentially non-verbal. Prior to that she was cognitively intact and able to mobilize with a wheelchair. She has also been refusing all food and drinks very little. He feels like he has been slowly watching her die.  ED Course: Seems dehydrated, started on IV fluids. Albumin 3.2. Found to be positive for COVID; this is believed to be incidental. Admitted to [facility] for observation and further evaluation of failure to thrive. Per H&P, "her current presentation appears to be consistent with progressive cognitive decline associated with dementia, quite possibly terminal dementia".    Clinical Assessment and Goals of Care: I have reviewed medical records including EPIC notes, labs and imaging, and examined the patient. She is non-verbal, although she is able to follow commands by squeezing my hand and wiggling her toes.   I spoke with husband Patricia [name] by phone to discuss diagnosis, prognosis, [place], EOL wishes, disposition, and options.  Patient and husband are known to PMT service from her hospital admission in June. I reviewed that Palliative Medicine is specialized medical care for people living with serious illness. It focuses on providing relief from the symptoms and stress of a serious illness.   For life review of the patient, please see my  consult note from 04/11/20. Patient has continued to live at home with her husband and their son Patricia [name].    As far as functional and nutritional status, Patricia [name] describes a significant decline over the past 3 weeks. Prior to that, she was cognitively intact and able to mobilize with a wheelchair. He states she was able to help with meal preparation sometimes. He does note that she seemed to be sleeping more during the day, often taking a nap for 3-4 hours. Patricia [name] feels that prior to the past 3 weeks, the patient had good quality of life.     We discussed her current illness and what it means in the larger context of her ongoing co-morbidities.  Provided education on the natural disease trajectory of chronic illness and debility. Discussed that patient has had recurrent issues with failure to thrive. Provided education that failure to thrive is a terminal condition that includes decreased ability to communicate, ambulate, swallow, and maintain continence. Also discussed that she has COVID, and although believed to be incidental, it is often worse for patients with advanced age and multiple health problems.   I attempted to elicit values and goals of care important to the patient. Patricia [name] wants her to return  home if possible. He is hopeful that she will recover and regain her former level of functioning. I gently stated that this unfortunately  may not be a realistic goal.   The difference between aggressive medical intervention and comfort care was considered in light of the patient's goals of care.  I introduced the concept of a comfort path to [facility] and encouraged him to think about at what point he would want to stop full scope medical interventions and focus on comfort rather than prolonging life. Introduced hospice philosophy and provided information on home vs residential hospice services. Patricia [name] seems open to comfort care if patient does not improve or further declines.   Discussed with Patricia [name] that the  primary concern is that patient is not eating, and if this continues her prognosis may be limited to weeks. I let Patricia [name] know that when I saw patient In June, we discussed concepts specific to code status, artifical feeding and hydration. She was clear that she would not want artificial feeding.  Discussed watchful waiting for the next 48 hours, to see if there is any improvement. Recommended transition to comfort if she further declines.   Questions and concerns were addressed.  The family was encouraged to call with questions or concerns.    Primary decision maker: Patricia [name] (spouse)    SUMMARY OF RECOMMENDATIONS    DNR/DNI as previously documented  Continue current medical care  Watchful waiting for 48 hours; husband is hopeful for improvement  Recommended transition to comfort if she further declines - husband seems open to this  Patient communicated to PMT in June that she would not want artificial feeding  PMT will continue to follow   Code Status/Advance Care Planning:  DNR  Symptom Management:   Per primary team  Palliative Prophylaxis:   Oral Care and Turn Reposition  Additional Recommendations (Limitations, Scope, Preferences):  No Artificial Feeding  Psycho-social/Spiritual:   Created space and opportunity for patient and family to express thoughts and feelings regarding patient's current medical situation.   Emotional support provided   Prognosis:   poor  Discharge Planning: To Be Determined      Primary Diagnoses: Present on Admission: . Failure to thrive in adult . Chronic kidney disease, stage 4, severely decreased GFR (HCC) . (Resolved) Hypertension . Hypothyroidism . COPD (chronic obstructive pulmonary disease) ([place]) . Essential hypertension   I have reviewed the medical record, interviewed the patient and family, and examined the patient. The following aspects are pertinent.  Past Medical History:  Diagnosis Date  . Abnormal gait    . Anxiety   . Arthritis   . Asthma   . COPD, mild ([place])   . Coronary artery disease    pt denies  . Depression   . Diverticulitis   . Dizziness   . DVT (deep venous thrombosis) ([place])   . GERD (gastroesophageal reflux disease)   . Hypertension   . Hypothyroidism   . Multiple allergies   . Pneumonia   . Pre-diabetes   . Reflux   . Renal disorder    had kidney transplant in 2009    Family History  Problem Relation Age of Onset  . Alzheimer's disease Mother   . High Cholesterol Mother   . Alcoholism Father    Scheduled Meds: . apixaban  2.5 mg Oral BID  . azaTHIOprine  50 mg Oral Q supper  . cycloSPORINE modified  100 mg Oral BID  . docusate sodium  100 mg Oral BID  . [START  ON 12/20/2020] FLUoxetine  20 mg Oral Daily  . [START ON 12/20/2020] fluticasone furoate-vilanterol  1 puff Inhalation Daily  . hydrocortisone sod succinate (SOLU-CORTEF) inj  50 mg Intravenous Q6H  . [START ON 12/20/2020] levothyroxine  75 mcg Oral Q0600  . mirtazapine  7.5 mg Oral QHS  . [START ON 12/20/2020] pravastatin  40 mg Oral Daily  . sodium chloride flush  3 mL Intravenous Q12H  . sulfamethoxazole-trimethoprim  1 tablet Oral Q M,W,F   Continuous Infusions: . lactated ringers 75 mL/hr (12/19/20 1703)  . remdesivir 200 mg in sodium chloride 0.9% 250 mL IVPB     Followed by  . [START ON 12/20/2020] remdesivir 100 mg in NS 100 mL     PRN Meds:.acetaminophen **OR** acetaminophen, albuterol, bisacodyl, hydrALAZINE, ondansetron **OR** ondansetron (ZOFRAN) IV, polyethylene glycol Medications Prior to Admission:  Prior to Admission medications   Medication Sig Start Date End Date Taking? Authorizing Provider  azaTHIOprine (IMURAN) 50 MG tablet Take 1 tablet (50 mg total) by mouth 2 (two) times daily. Patient taking differently: Take 50 mg by mouth daily with supper. 04/27/20  Yes Ngetich, Dinah C, NP  acetaminophen (TYLENOL) 325 MG tablet Take 2 tablets (650 mg total) by mouth every 6 (six) hours as  needed. 04/27/20   Ngetich, Dinah C, NP  albuterol (VENTOLIN HFA) 108 (90 Base) MCG/ACT inhaler Inhale 2 puffs into the lungs every 6 (six) hours as needed for wheezing or shortness of breath. 04/27/20   Ngetich, Dinah C, NP  apixaban (ELIQUIS) 2.5 MG TABS tablet Take 1 tablet (2.5 mg total) by mouth 2 (two) times daily. 04/27/20   Ngetich, Dinah C, NP  b complex vitamins tablet Take 1 tablet by mouth daily. 04/27/20   Ngetich, Dinah C, NP  bisacodyl (DULCOLAX) 5 MG EC tablet Take 1 tablet (5 mg total) by mouth daily as needed for moderate constipation. 04/27/20   Ngetich, Dinah C, NP  budesonide-formoterol (SYMBICORT) 160-4.5 MCG/ACT inhaler Inhale 2 puffs into the lungs in the morning and at bedtime. 04/27/20   Ngetich, Dinah C, NP  cetirizine (ZYRTEC) 10 MG tablet Take 1 tablet (10 mg total) by mouth daily. 04/27/20   Ngetich, Dinah C, NP  Cholecalciferol (VITAMIN D) 50 MCG (2000 UT) tablet Take 1 tablet (2,000 Units total) by mouth every evening. 04/27/20   Ngetich, Dinah C, NP  cycloSPORINE modified (NEORAL) 100 MG capsule Take 1 capsule (100 mg total) by mouth 2 (two) times daily. 04/27/20   Ngetich, Dinah C, NP  DIPHENHYDRAMINE HCL, TOPICAL, (BENADRYL ITCH STOPPING) 2 % GEL Apply 1 application topically daily as needed for itch relief 04/27/20   Ngetich, Dinah C, NP  docusate sodium (COLACE) 100 MG capsule Take 1 capsule (100 mg total) by mouth 2 (two) times daily. 04/27/20   Ngetich, Dinah C, NP  famotidine (PEPCID) 20 MG tablet Take 1 tablet (20 mg total) by mouth daily with supper. 04/27/20   Ngetich, Dinah C, NP  ferrous sulfate 325 (65 FE) MG tablet Take 1 tablet (325 mg total) by mouth daily with breakfast. 04/27/20   Ngetich, Dinah C, NP  FLUoxetine (PROZAC) 20 MG tablet Take 1 tablet (20 mg total) by mouth daily. 04/27/20   Ngetich, Dinah C, NP  fluticasone (FLONASE) 50 MCG/ACT nasal spray Place 1 spray into both nostrils daily as needed for allergies or rhinitis. 04/27/20   Ngetich, Dinah C, NP   Glucerna (GLUCERNA) LIQD Take 237 mLs by mouth 2 (two) times daily. 04/27/20   Ngetich, [employer]  C, NP  levothyroxine (SYNTHROID) 75 MCG tablet Take 1 tablet (75 mcg total) by mouth daily. 04/27/20   Ngetich, Dinah C, NP  Lutein-Zeaxanthin 25-5 MG CAPS Take 1 capsule by mouth daily. 04/27/20   Ngetich, Dinah C, NP  metoCLOPramide (REGLAN) 5 MG tablet Take 1 tablet (5 mg total) by mouth 4 (four) times daily. 04/27/20   Ngetich, Dinah C, NP  mirabegron ER (MYRBETRIQ) 50 MG TB24 tablet Take 1 tablet (50 mg total) by mouth every evening. 04/27/20   Ngetich, Dinah C, NP  mirtazapine (REMERON) 15 MG tablet Take 0.5 tablets (7.5 mg total) by mouth at bedtime. 04/27/20   Ngetich, Dinah C, NP  Multiple Vitamin (MULTIVITAMIN WITH MINERALS) TABS tablet Take 1 tablet by mouth daily. 04/27/20   Ngetich, Dinah C, NP  omeprazole (PRILOSEC) 20 MG capsule Take 1 capsule (20 mg total) by mouth daily. 04/27/20   Ngetich, Dinah C, NP  ondansetron (ZOFRAN) 4 MG tablet Take 1 tablet (4 mg total) by mouth every 4 (four) hours as needed. 04/27/20   Ngetich, Dinah C, NP  oxybutynin (DITROPAN-XL) 10 MG 24 hr tablet Take 1 tablet (10 mg total) by mouth daily. 04/27/20   Ngetich, Dinah C, NP  phenylephrine-shark liver oil-mineral oil-petrolatum (PREPARATION H) 0.25-3-14-71.9 % rectal ointment Place 1 application rectally 2 (two) times daily as needed for hemorrhoids. 04/27/20   Ngetich, Dinah C, NP  polyethylene glycol (MIRALAX / GLYCOLAX) 17 g packet Take 17 g by mouth daily as needed for mild constipation. 04/27/20   Ngetich, Dinah C, NP  pravastatin (PRAVACHOL) 40 MG tablet Take 1 tablet (40 mg total) by mouth daily. 04/27/20   Ngetich, Dinah C, NP  predniSONE (DELTASONE) 5 MG tablet Take 1 tablet (5 mg total) by mouth daily with breakfast. 04/27/20   Ngetich, Dinah C, NP  sulfamethoxazole-trimethoprim (BACTRIM) 400-80 MG tablet Take 1 tablet by mouth every Monday, Wednesday, and Friday. 04/27/20   Ngetich, Nelda Bucks, NP   Allergies   Allergen Reactions  . Macrolides And Ketolides Nausea Only  . Aricept [Donepezil Hcl] Nausea And Vomiting  . Codeine Nausea And Vomiting  . Morphine Nausea And Vomiting  . Penicillin G Nausea Only  . Penicillins Nausea And Vomiting and Other (See Comments)    HEADACHE Has patient had a PCN reaction causing immediate rash, facial/tongue/throat swelling, SOB or lightheadedness with hypotension: unkn Has patient had a PCN reaction causing severe rash involving mucus membranes or skin necrosis: unkn Has patient had a PCN reaction that required hospitalization: unkn Has patient had a PCN reaction occurring within the last 10 years: unkn If all of the above answers are "NO", then may proceed with Cephalosporin use.   . Statins Other (See Comments)    Doesn't remember  . Streptomycin Other (See Comments)    headache   . Tetracycline Nausea Only  . Tetracyclines & Related Rash   Review of Systems  Unable to perform ROS: Patient nonverbal    Physical Exam Vitals reviewed.  Constitutional:      General: She is not in acute distress.    Appearance: She is cachectic. She is ill-appearing.  Cardiovascular:     Rate and Rhythm: Normal rate and regular rhythm.  Pulmonary:     Effort: Pulmonary effort is normal.  Neurological:     Mental Status: She is easily aroused.     Motor: Weakness present.     Comments: Non-verbal Follows commands     Vital Signs: BP (!) 173/85   Pulse  82   Temp 98.6 F (37 C) (Oral)   Resp (!) 24   SpO2 97%  Pain Scale: 0-10   Pain Score: Asleep   SpO2: SpO2: 97 % O2 Device:SpO2: 97 % O2 Flow Rate: .O2 Flow Rate (L/min): 0 L/min  IO: Intake/output summary:   Intake/Output Summary (Last 24 hours) at 12/19/2020 2001 Last data filed at 12/19/2020 1911 Gross per 24 hour  Intake 1000 ml  Output -  Net 1000 ml      Palliative Assessment/Data: PPS 20%     Time In: 18:50 Time Out: 20:01 Time Total: 71 minutes Greater than 50%  of this time  was spent counseling and coordinating care related to the above assessment and plan.  Signed by: Lavena Bullion, NP   Please contact Palliative Medicine Team phone at [phone] for questions and concerns.  For individual provider: See Shea Evans

## 2020-12-19 NOTE — H&P (Addendum)
History and Physical    Patricia [name] [id number] DOB: 04-29-1932 DOA: 12/19/2020  PCP: [name], MD Consultants:  [facility] - orthopedics Patient coming from: Home - lives with husband and son; [id number], [place], [phone]; [phone]   Chief Complaint: FTT  HPI: Patricia [name] is a 87 y.o. female with medical history significant of kidney transplant (2009); pre-DM; hypothyroidism; HTN; CAD; and mild COPD presenting with failure to thrive.  Her husband reports that up to 3 weeks ago she was cognitively intact and able to mobilize with a wheelchair.  She has not been ambulatory in over a year.  She has slowly been deteriorating over time.  In the last 3 weeks, she has refused to get out of bed and is now essentially non-verbal.  She has been refusing all food and drinks very little.  He feels like he has been slowly watching her die.  He would like to keep her at home and would ideally prefer for her to improve to the point that she is mobile in her wheelchair and able to come to the table for meals again.  He does have a 66yo single son who lives with them and helps to care for them, but he recognizes that they may need more help.      ED Course:  Not eating/drinking, "slowly dying in front of my eyes."  Looks dry.  Maybe needs obs, hydration, nutrition, HH, palliative care.  Review of Systems: Unable to perform   Ambulatory Status:  Non-ambulatory  COVID Vaccine Status:  She has received at least 1 shot  Past Medical History:  Diagnosis Date  . Abnormal gait   . Anxiety   . Arthritis   . Asthma   . COPD, mild ([place])   . Coronary artery disease    pt denies  . Depression   . Diverticulitis   . Dizziness   . DVT (deep venous thrombosis) ([place])   . GERD (gastroesophageal reflux disease)   . Hypertension   . Hypothyroidism   . Multiple allergies   . Pneumonia   . Pre-diabetes   . Reflux   . Renal disorder    had kidney transplant in 2009     Past Surgical History:  Procedure Laterality Date  . ANTERIOR APPROACH HEMI HIP ARTHROPLASTY Right 02/10/2016   Procedure: TOTAL HIP ARTHROPLASTY ANTERIOR APPROACH;  Surgeon: Rod Can, MD;  Location: [facility];  Service: Orthopedics;  Laterality: Right;  . CHOLECYSTECTOMY N/A 07/01/2018   Procedure: LAPAROSCOPIC CHOLECYSTECTOMY WITH INTRAOPERATIVE CHOLANGIOGRAM;  Surgeon: Jovita Kussmaul, MD;  Location: [facility];  Service: General;  Laterality: N/A;  . COLONOSCOPY    . NEPHRECTOMY TRANSPLANTED ORGAN    . TUMOR REMOVAL     Brain     Social History   Socioeconomic History  . Marital status: Married    Spouse name: [name]  . Number of children: 2  . Years of education: college  . Highest education level: Not on file  Occupational History  . Occupation: retired  Tobacco Use  . Smoking status: Never Smoker  . Smokeless tobacco: Never Used  Vaping Use  . Vaping Use: Never used  Substance and Sexual Activity  . Alcohol use: No  . Drug use: No  . Sexual activity: Not on file  Other Topics Concern  . Not on file  Social History Narrative   Patient lives at home with her husband [name]). Patient is retired. Patient has two children. Patient has college education.   Right  handed.   Caffeine- None- Tea during day one cup   Social Determinants of Health   Financial Resource Strain: Not on file  Food Insecurity: Not on file  Transportation Needs: Not on file  Physical Activity: Not on file  Stress: Not on file  Social Connections: Not on file  Intimate Partner Violence: Not on file    Allergies  Allergen Reactions  . Macrolides And Ketolides Nausea Only  . Aricept [Donepezil Hcl] Nausea And Vomiting  . Codeine Nausea And Vomiting  . Morphine Nausea And Vomiting  . Penicillin G Nausea Only  . Penicillins Nausea And Vomiting and Other (See Comments)    HEADACHE Has patient had a PCN reaction causing immediate rash, facial/tongue/throat swelling, SOB or lightheadedness  with hypotension: unkn Has patient had a PCN reaction causing severe rash involving mucus membranes or skin necrosis: unkn Has patient had a PCN reaction that required hospitalization: unkn Has patient had a PCN reaction occurring within the last 10 years: unkn If all of the above answers are "NO", then may proceed with Cephalosporin use.   . Statins Other (See Comments)    Doesn't remember  . Streptomycin Other (See Comments)    headache   . Tetracycline Nausea Only  . Tetracyclines & Related Rash    Family History  Problem Relation Age of Onset  . Alzheimer's disease Mother   . High Cholesterol Mother   . Alcoholism Father     Prior to Admission medications   Medication Sig Start Date End Date Taking? Authorizing Provider  acetaminophen (TYLENOL) 325 MG tablet Take 2 tablets (650 mg total) by mouth every 6 (six) hours as needed. 04/27/20   Ngetich, Dinah C, NP  albuterol (VENTOLIN HFA) 108 (90 Base) MCG/ACT inhaler Inhale 2 puffs into the lungs every 6 (six) hours as needed for wheezing or shortness of breath. 04/27/20   Ngetich, Dinah C, NP  apixaban (ELIQUIS) 2.5 MG TABS tablet Take 1 tablet (2.5 mg total) by mouth 2 (two) times daily. 04/27/20   Ngetich, Dinah C, NP  azaTHIOprine (IMURAN) 50 MG tablet Take 1 tablet (50 mg total) by mouth 2 (two) times daily. 04/27/20   Ngetich, Dinah C, NP  b complex vitamins tablet Take 1 tablet by mouth daily. 04/27/20   Ngetich, Dinah C, NP  bisacodyl (DULCOLAX) 5 MG EC tablet Take 1 tablet (5 mg total) by mouth daily as needed for moderate constipation. 04/27/20   Ngetich, Dinah C, NP  budesonide-formoterol (SYMBICORT) 160-4.5 MCG/ACT inhaler Inhale 2 puffs into the lungs in the morning and at bedtime. 04/27/20   Ngetich, Dinah C, NP  cetirizine (ZYRTEC) 10 MG tablet Take 1 tablet (10 mg total) by mouth daily. 04/27/20   Ngetich, Dinah C, NP  Cholecalciferol (VITAMIN D) 50 MCG (2000 UT) tablet Take 1 tablet (2,000 Units total) by mouth every evening.  04/27/20   Ngetich, Dinah C, NP  cycloSPORINE modified (NEORAL) 100 MG capsule Take 1 capsule (100 mg total) by mouth 2 (two) times daily. 04/27/20   Ngetich, Dinah C, NP  DIPHENHYDRAMINE HCL, TOPICAL, (BENADRYL ITCH STOPPING) 2 % GEL Apply 1 application topically daily as needed for itch relief 04/27/20   Ngetich, Dinah C, NP  docusate sodium (COLACE) 100 MG capsule Take 1 capsule (100 mg total) by mouth 2 (two) times daily. 04/27/20   Ngetich, Dinah C, NP  famotidine (PEPCID) 20 MG tablet Take 1 tablet (20 mg total) by mouth daily with supper. 04/27/20   Ngetich, Nelda Bucks, NP  ferrous sulfate 325 (65 FE) MG tablet Take 1 tablet (325 mg total) by mouth daily with breakfast. 04/27/20   Ngetich, Dinah C, NP  FLUoxetine (PROZAC) 20 MG tablet Take 1 tablet (20 mg total) by mouth daily. 04/27/20   Ngetich, Dinah C, NP  fluticasone (FLONASE) 50 MCG/ACT nasal spray Place 1 spray into both nostrils daily as needed for allergies or rhinitis. 04/27/20   Ngetich, Dinah C, NP  Glucerna (GLUCERNA) LIQD Take 237 mLs by mouth 2 (two) times daily. 04/27/20   Ngetich, Dinah C, NP  levothyroxine (SYNTHROID) 75 MCG tablet Take 1 tablet (75 mcg total) by mouth daily. 04/27/20   Ngetich, Dinah C, NP  Lutein-Zeaxanthin 25-5 MG CAPS Take 1 capsule by mouth daily. 04/27/20   Ngetich, Dinah C, NP  metoCLOPramide (REGLAN) 5 MG tablet Take 1 tablet (5 mg total) by mouth 4 (four) times daily. 04/27/20   Ngetich, Dinah C, NP  mirabegron ER (MYRBETRIQ) 50 MG TB24 tablet Take 1 tablet (50 mg total) by mouth every evening. 04/27/20   Ngetich, Dinah C, NP  mirtazapine (REMERON) 15 MG tablet Take 0.5 tablets (7.5 mg total) by mouth at bedtime. 04/27/20   Ngetich, Dinah C, NP  Multiple Vitamin (MULTIVITAMIN WITH MINERALS) TABS tablet Take 1 tablet by mouth daily. 04/27/20   Ngetich, Dinah C, NP  omeprazole (PRILOSEC) 20 MG capsule Take 1 capsule (20 mg total) by mouth daily. 04/27/20   Ngetich, Dinah C, NP  ondansetron (ZOFRAN) 4 MG tablet Take 1  tablet (4 mg total) by mouth every 4 (four) hours as needed. 04/27/20   Ngetich, Dinah C, NP  oxybutynin (DITROPAN-XL) 10 MG 24 hr tablet Take 1 tablet (10 mg total) by mouth daily. 04/27/20   Ngetich, Dinah C, NP  phenylephrine-shark liver oil-mineral oil-petrolatum (PREPARATION H) 0.25-3-14-71.9 % rectal ointment Place 1 application rectally 2 (two) times daily as needed for hemorrhoids. 04/27/20   Ngetich, Dinah C, NP  polyethylene glycol (MIRALAX / GLYCOLAX) 17 g packet Take 17 g by mouth daily as needed for mild constipation. 04/27/20   Ngetich, Dinah C, NP  pravastatin (PRAVACHOL) 40 MG tablet Take 1 tablet (40 mg total) by mouth daily. 04/27/20   Ngetich, Dinah C, NP  predniSONE (DELTASONE) 5 MG tablet Take 1 tablet (5 mg total) by mouth daily with breakfast. 04/27/20   Ngetich, Dinah C, NP  sulfamethoxazole-trimethoprim (BACTRIM) 400-80 MG tablet Take 1 tablet by mouth every Monday, Wednesday, and Friday. 04/27/20   Ngetich, Nelda Bucks, NP    Physical Exam: Vitals:   12/19/20 1430 12/19/20 1500 12/19/20 1530 12/19/20 1615  BP: (!) 165/85 (!) 180/92 (!) 174/84 (!) 160/74  Pulse: 99 94 99 92  Resp: (!) 24     Temp:      TempSrc:      SpO2: 98% 95% 97% 98%     . General:  Appears calm and comfortable, very frail and cachectic . Eyes:  PERRL, EOMI, normal lids, iris . ENT:  Dry lips & tongue, dry mm . Neck:  no LAD, masses or thyromegaly . Cardiovascular:  RRR, no m/r/g. No LE edema.  Marland Kitchen Respiratory:   CTA bilaterally with no wheezes/rales/rhonchi.  Normal respiratory effort. . Abdomen:  soft, NT, ND . Skin:  no rash or induration seen on limited exam . Musculoskeletal:  decreased tone BUE/BLE, no bony abnormality . Lower extremity:  No LE edema.  Limited foot exam with no ulcerations.  2+ distal pulses. Marland Kitchen Psychiatric:  Alert, follows some commands, minimal  attempts at verbalization - just stares back blankly when asked a question Neurologic:  Unable to perform   Radiological Exams on  Admission: Independently reviewed - see discussion in A/P where applicable  DG Chest Portable 1 View  Result Date: 12/19/2020 CLINICAL DATA:  Altered mental status EXAM: PORTABLE CHEST 1 VIEW COMPARISON:  04/11/2020 FINDINGS: Hyperinflation. No new consolidation or edema. Left lung calcified granuloma is again identified. No pleural effusion. Stable cardiomediastinal contours with normal heart size. IMPRESSION: No acute process in the chest. Electronically Signed   By: Macy Mis M.D.   On: 12/19/2020 14:52    EKG: Independently reviewed.  Sinus tachycardia with rate 117; nonspecific ST changes with LVH   Labs on Admission: I have personally reviewed the available labs and imaging studies at the time of the admission.  Pertinent labs:   Glucose 180 BUN 51/Creatinine 2.16/GFR 22; 22/1.4/33 on 04/26/20 WBC 11.1   Assessment/Plan Principal Problem:   Failure to thrive in adult Active Problems:   History of renal transplant   Hypothyroidism   COPD (chronic obstructive pulmonary disease) ([place])   Diabetes type 2, controlled ([place])   Chronic kidney disease, stage 4, severely decreased GFR ([facility])   Essential hypertension   Goals of care, counseling/discussion    Failure to thrive -Patient was previously hospitalized with the same on 12/2019 and 03/2020 -While the family reports that this has been going on for a couple of weeks, in review of the chart she appeared to have "rapid cognitive changes" and "self-hygiene difficulties" for over a year -She also has 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 -At the time of my evaluation in June 2021, I noted that "she appeared to have advanced dementia - she was conversational but not oriented even to person and continuously fidgeted with her mask, even trying to chew on it once" -Her current presentation appears to be consistent with progressive cognitive decline associated with dementia, quite possibly terminal  dementia -Will observe with PT/OT/ST/nutrition evaluations -Her family is providing 24/7 caregiver assistance and yet she is requiring more care over time; she may benefit from additional [facility] support vs. SNF placement -Will request palliative care evaluation for goals of care -TOC consult placed, and her husband prefers [facility] SNF if needed -She has not been eating well and does not appear to have taken meds; she may be undergoing adrenal crisis so will give stress-dosed steroids for now and then can resume PO prednisone  -Palliative care consult requested -Continue Prozac, as depression may be contributing  Stage 4 CKD in the setting of prior renal transplant -h/o renal transplant but her renal function appears to be stable with stage 4 CKD at this time with possible mild AKI -She does have dry mucus membranes on exam so will give IVF -Continue Imuran, cyclosporine -Continue 3 times weekly Bactrim  HTN -She does not appear to be taking BP medications at this time (although awaiting Med Rec completion) -BP is currently significantly elevated -Will cover with prn hydralazine  HLD -Continue Pravachol for now - although the long-term efficacy in this situation is clearly questionable  DM -Prior A1c 5.1, not on medications -If fasting labs indicate hyperglycemia while giving higher dosed steroids, can consider treatment - but none now  Hypothyroidism -Recheck TSH - normal 1 year ago -Continue Synthroid at current dose for now  Chronic diastolic CHF -[id number] echo with grade 2 diastolic dysfunction -Appears to be compensated at this time  COPD -Continue Symbicort, Albuterol  H/o DVT -  Chronic DVT on [place] in 2014 without acute DVT -Chronic but possibly worse DVT also present in 2018 -It is not clear that she needs lifelong AC without acute DVT -Continue Eliquis for now but consider d/c   Note: This patient has been tested and is pending for the novel coronavirus  COVID-19.   DVT prophylaxis:Eliquis Code Status:DNR- confirmed with paper in chart/ACPs Family Communication:Husband was present throughout evalaution. Disposition Plan:She is anticipated to d/c toSNF and does not appear to have acute medical issues requiring her to remain hospitalized for more than a day or two Consults called:PT/OT/Nutrition/TOC team; palliative care Admission status:It is my clinical opinion that referral forOBSERVATIONis reasonable and necessary in this patient based on the above information provided. The aforementioned taken together are felt to place the patient at high risk for further clinical deterioration. However it is anticipated that the patient may be medically stable for discharge from the hospital within 24 to 48 hours.   Karmen Bongo MD [facility]   How to contact the [facility] Attending or Consulting provider [place] or covering provider during after hours [place], for this patient?  1. Check the care team in [facility] and look for a) attending/consulting [facility] provider listed and b) the [facility] team listed 2. Log into [email or web address] and use [place]'s universal password to access. If you do not have the password, please contact the hospital operator. 3. Locate the [facility] provider you are looking for under [facility] and page to a number that you can be directly reached. 4. If you still have difficulty reaching the provider, please page the [facility] (Director on Call) for the Hospitalists listed on amion for assistance.   12/19/2020, 4:40 PM

## 2020-12-19 NOTE — ED Triage Notes (Signed)
Pt from home, where family/caregivers report general decline over the last several days. Pt has no complaints but is not eating and drinking more than a fruit cup per day. Pt at baseline mental status, answering some yes/no answers, following commands.

## 2020-12-20 DIAGNOSIS — Z515 Encounter for palliative care: Secondary | ICD-10-CM | POA: Diagnosis not present

## 2020-12-20 DIAGNOSIS — F32A Depression, unspecified: Secondary | ICD-10-CM | POA: Diagnosis present

## 2020-12-20 DIAGNOSIS — I5032 Chronic diastolic (congestive) heart failure: Secondary | ICD-10-CM | POA: Diagnosis present

## 2020-12-20 DIAGNOSIS — Z681 Body mass index (BMI) 19 or less, adult: Secondary | ICD-10-CM | POA: Diagnosis not present

## 2020-12-20 DIAGNOSIS — F419 Anxiety disorder, unspecified: Secondary | ICD-10-CM | POA: Diagnosis present

## 2020-12-20 DIAGNOSIS — E785 Hyperlipidemia, unspecified: Secondary | ICD-10-CM | POA: Diagnosis present

## 2020-12-20 DIAGNOSIS — K219 Gastro-esophageal reflux disease without esophagitis: Secondary | ICD-10-CM | POA: Diagnosis present

## 2020-12-20 DIAGNOSIS — F0390 Unspecified dementia without behavioral disturbance: Secondary | ICD-10-CM | POA: Diagnosis present

## 2020-12-20 DIAGNOSIS — E039 Hypothyroidism, unspecified: Secondary | ICD-10-CM | POA: Diagnosis present

## 2020-12-20 DIAGNOSIS — Z993 Dependence on wheelchair: Secondary | ICD-10-CM | POA: Diagnosis not present

## 2020-12-20 DIAGNOSIS — D638 Anemia in other chronic diseases classified elsewhere: Secondary | ICD-10-CM | POA: Diagnosis present

## 2020-12-20 DIAGNOSIS — N184 Chronic kidney disease, stage 4 (severe): Secondary | ICD-10-CM | POA: Diagnosis present

## 2020-12-20 DIAGNOSIS — I2510 Atherosclerotic heart disease of native coronary artery without angina pectoris: Secondary | ICD-10-CM | POA: Diagnosis present

## 2020-12-20 DIAGNOSIS — E43 Unspecified severe protein-calorie malnutrition: Secondary | ICD-10-CM | POA: Diagnosis present

## 2020-12-20 DIAGNOSIS — R627 Adult failure to thrive: Secondary | ICD-10-CM | POA: Diagnosis present

## 2020-12-20 DIAGNOSIS — Z96641 Presence of right artificial hip joint: Secondary | ICD-10-CM | POA: Diagnosis present

## 2020-12-20 DIAGNOSIS — N179 Acute kidney failure, unspecified: Secondary | ICD-10-CM | POA: Diagnosis not present

## 2020-12-20 DIAGNOSIS — Z86718 Personal history of other venous thrombosis and embolism: Secondary | ICD-10-CM | POA: Diagnosis not present

## 2020-12-20 DIAGNOSIS — U071 COVID-19: Secondary | ICD-10-CM | POA: Diagnosis present

## 2020-12-20 DIAGNOSIS — J42 Unspecified chronic bronchitis: Secondary | ICD-10-CM | POA: Diagnosis not present

## 2020-12-20 DIAGNOSIS — I825Y2 Chronic embolism and thrombosis of unspecified deep veins of left proximal lower extremity: Secondary | ICD-10-CM | POA: Diagnosis not present

## 2020-12-20 DIAGNOSIS — I130 Hypertensive heart and chronic kidney disease with heart failure and stage 1 through stage 4 chronic kidney disease, or unspecified chronic kidney disease: Secondary | ICD-10-CM | POA: Diagnosis present

## 2020-12-20 DIAGNOSIS — Z66 Do not resuscitate: Secondary | ICD-10-CM | POA: Diagnosis present

## 2020-12-20 DIAGNOSIS — E1122 Type 2 diabetes mellitus with diabetic chronic kidney disease: Secondary | ICD-10-CM | POA: Diagnosis present

## 2020-12-20 DIAGNOSIS — Z940 Kidney transplant status: Secondary | ICD-10-CM | POA: Diagnosis not present

## 2020-12-20 DIAGNOSIS — J449 Chronic obstructive pulmonary disease, unspecified: Secondary | ICD-10-CM | POA: Diagnosis present

## 2020-12-20 DIAGNOSIS — E860 Dehydration: Secondary | ICD-10-CM | POA: Diagnosis present

## 2020-12-20 LAB — PROCALCITONIN: Procalcitonin: 0.96 ng/mL

## 2020-12-20 LAB — COMPREHENSIVE METABOLIC PANEL
ALT: 9 U/L (ref 0–44)
AST: 18 U/L (ref 15–41)
Albumin: 2.7 g/dL — ABNORMAL LOW (ref 3.5–5.0)
Alkaline Phosphatase: 61 U/L (ref 38–126)
Anion gap: 10 (ref 5–15)
BUN: 46 mg/dL — ABNORMAL HIGH (ref 8–23)
CO2: 19 mmol/L — ABNORMAL LOW (ref 22–32)
Calcium: 9.5 mg/dL (ref 8.9–10.3)
Chloride: 114 mmol/L — ABNORMAL HIGH (ref 98–111)
Creatinine, Ser: 1.87 mg/dL — ABNORMAL HIGH (ref 0.44–1.00)
GFR, Estimated: 26 mL/min — ABNORMAL LOW (ref >60)
Glucose, Bld: 136 mg/dL — ABNORMAL HIGH (ref 70–99)
Potassium: 3.7 mmol/L (ref 3.5–5.1)
Sodium: 143 mmol/L (ref 135–145)
Total Bilirubin: 0.8 mg/dL (ref 0.3–1.2)
Total Protein: 7.1 g/dL (ref 6.5–8.1)

## 2020-12-20 LAB — CBC
HCT: 31.2 % — ABNORMAL LOW (ref 36.0–46.0)
Hemoglobin: 10.4 g/dL — ABNORMAL LOW (ref 12.0–15.0)
MCH: 31.7 pg (ref 26.0–34.0)
MCHC: 33.3 g/dL (ref 30.0–36.0)
MCV: 95.1 fL (ref 80.0–100.0)
Platelets: 179 10*3/uL (ref 150–400)
RBC: 3.28 MIL/uL — ABNORMAL LOW (ref 3.87–5.11)
RDW: 13.3 % (ref 11.5–15.5)
WBC: 5.9 10*3/uL (ref 4.0–10.5)
nRBC: 0 % (ref 0.0–0.2)

## 2020-12-20 LAB — FERRITIN: Ferritin: 435 ng/mL — ABNORMAL HIGH (ref 11–307)

## 2020-12-20 LAB — RETICULOCYTES
Immature Retic Fract: 10 % (ref 2.3–15.9)
RBC.: 3.32 MIL/uL — ABNORMAL LOW (ref 3.87–5.11)
Retic Count, Absolute: 28.9 10*3/uL (ref 19.0–186.0)
Retic Ct Pct: 0.9 % (ref 0.4–3.1)

## 2020-12-20 LAB — IRON AND TIBC
Iron: 18 ug/dL — ABNORMAL LOW (ref 28–170)
Saturation Ratios: 14 % (ref 10.4–31.8)
TIBC: 127 ug/dL — ABNORMAL LOW (ref 250–450)
UIBC: 109 ug/dL

## 2020-12-20 LAB — BRAIN NATRIURETIC PEPTIDE: B Natriuretic Peptide: 568.7 pg/mL — ABNORMAL HIGH (ref 0.0–100.0)

## 2020-12-20 LAB — FOLATE: Folate: 35.2 ng/mL (ref >5.9)

## 2020-12-20 LAB — D-DIMER, QUANTITATIVE: D-Dimer, Quant: 7.15 ug/mL-FEU — ABNORMAL HIGH (ref 0.00–0.50)

## 2020-12-20 LAB — TYPE AND SCREEN
ABO/RH(D): O POS
Antibody Screen: NEGATIVE

## 2020-12-20 LAB — MAGNESIUM: Magnesium: 2.1 mg/dL (ref 1.7–2.4)

## 2020-12-20 LAB — VITAMIN B12: Vitamin B-12: 397 pg/mL (ref 180–914)

## 2020-12-20 LAB — C-REACTIVE PROTEIN: CRP: 12 mg/dL — ABNORMAL HIGH (ref <1.0)

## 2020-12-20 MED ORDER — HYDROCORTISONE NA SUCCINATE PF 100 MG IJ SOLR
50.0000 mg | Freq: Two times a day (BID) | INTRAMUSCULAR | Status: DC
  Administered 2020-12-20 – 2020-12-22 (×4): 50 mg via INTRAVENOUS
  Filled 2020-12-20 (×4): qty 2

## 2020-12-20 MED ORDER — LACTATED RINGERS IV SOLN: INTRAVENOUS | Status: AC

## 2020-12-20 MED ORDER — FERROUS SULFATE 325 (65 FE) MG PO TABS
325.0000 mg | ORAL_TABLET | Freq: Every day | ORAL | Status: DC
Start: 2020-12-21 — End: 2020-12-22
  Administered 2020-12-21 – 2020-12-22 (×2): 325 mg via ORAL
  Filled 2020-12-20 (×2): qty 1

## 2020-12-20 MED ORDER — CHLORHEXIDINE GLUCONATE 0.12 % MT SOLN
15.0000 mL | Freq: Two times a day (BID) | OROMUCOSAL | Status: DC
  Administered 2020-12-20 – 2020-12-22 (×5): 15 mL via OROMUCOSAL
  Filled 2020-12-20 (×5): qty 15

## 2020-12-20 MED ORDER — PANTOPRAZOLE SODIUM 40 MG PO TBEC
40.0000 mg | DELAYED_RELEASE_TABLET | Freq: Two times a day (BID) | ORAL | Status: DC
  Administered 2020-12-20 – 2020-12-22 (×4): 40 mg via ORAL
  Filled 2020-12-20 (×4): qty 1

## 2020-12-20 MED ORDER — ENSURE ENLIVE PO LIQD
237.0000 mL | Freq: Three times a day (TID) | ORAL | Status: DC
  Administered 2020-12-20 – 2020-12-22 (×7): 237 mL via ORAL

## 2020-12-20 MED ORDER — ADULT MULTIVITAMIN W/MINERALS CH
1.0000 | ORAL_TABLET | Freq: Every day | ORAL | Status: DC
  Administered 2020-12-20 – 2020-12-22 (×3): 1 via ORAL
  Filled 2020-12-20 (×3): qty 1

## 2020-12-20 MED ORDER — PANTOPRAZOLE SODIUM 40 MG PO TBEC
40.0000 mg | DELAYED_RELEASE_TABLET | Freq: Every day | ORAL | Status: DC
  Administered 2020-12-20: 40 mg via ORAL
  Filled 2020-12-20: qty 1

## 2020-12-20 MED ORDER — RESOURCE THICKENUP CLEAR PO POWD
ORAL | Status: DC | PRN
  Filled 2020-12-20: qty 125

## 2020-12-20 MED ORDER — ORAL CARE MOUTH RINSE
15.0000 mL | Freq: Two times a day (BID) | OROMUCOSAL | Status: DC
  Administered 2020-12-20 (×2): 15 mL via OROMUCOSAL

## 2020-12-20 NOTE — Evaluation (Signed)
Physical Therapy Evaluation Patient Details Name: Patricia [name] MRN: [id number] DOB: [birth year] Today's Date: 12/20/2020   History of Present Illness  87 y.o. female presenting to ED with failure to thrive, admitted for observation 12/19/20. Family reports until 3wks ago cognitively intact using w/c to mobilize. Pt has been non-ambulatory for over a yesr. Now she is non-verbal, refusing food and drink. Found to have incidental COVID+ infection. [id number] transplant (2009); pre-DM; hypothyroidism; HTN; CAD; and mild COPD  Clinical Impression  Pt with limited response to questions and home set up and PLOF collected from notes from this and prior hospitalization. Pt with need for increased time with processing to perform tasks and answer questions. Pt with good ROM UE/LE, decrease pelvic and trunk mobility. Assessed in mobility pt has generalized weakness limiting mobility. Despite deficits pt able to come to EoB with min A, come to standing with modA and take lateral steps towards HoB with mod A. Pt is likely close to baseline, and will require HHPT at discharge to return to baseline level of function. PT will continue to follow acutely.      Follow Up Recommendations Home health PT;Supervision/Assistance - 24 hour    Equipment Recommendations  None recommended by PT    Recommendations for Other Services Other (comment) (Palliative)     Precautions / Restrictions Precautions Precautions: Fall Restrictions Weight Bearing Restrictions: No      Mobility  Bed Mobility Overal bed mobility: Needs Assistance Bed Mobility: Supine to Sit;Sit to Supine     Supine to sit: Min assist Sit to supine: Min guard   General bed mobility comments: min A for bringing hips to EoB with pad, pt able to navigate LE to floor and use bedrail to bring trunk to upright min guard for pt to return to bed once in bed given pad scoot toward center of bed, with cuing pt able to scoot hips up in the bed     Transfers Overall transfer level: Needs assistance Equipment used: Rolling walker (2 wheeled) Transfers: Sit to/from Stand Sit to Stand: Min assist;Mod assist         General transfer comment: min A for power up to RW,modA and maximal cuing for coming to upright and posterior pelvic tilt, with 1st attempt is not able to come to fully upright stating "wheelchair" over and over again, on second attempt able to get her hips underneath her. to prepare for stepping  Ambulation/Gait Ambulation/Gait assistance: Mod assist Gait Distance (Feet): 2 Feet Assistive device: Rolling walker (2 wheeled) Gait Pattern/deviations: Step-to pattern;Decreased step length - right;Decreased step length - left;Shuffle Gait velocity: slowed Gait velocity interpretation: <1.31 ft/sec, indicative of household ambulator General Gait Details: mod A for physical assist to keep hips forward and trunk upright, with proper positioning pt can take small steps towards HoB while blocking LE against bed for added support         Balance Overall balance assessment: Needs assistance Sitting-balance support: Feet supported;No upper extremity supported Sitting balance-Leahy Scale: Fair     Standing balance support: Bilateral upper extremity supported;During functional activity Standing balance-Leahy Scale: Poor Standing balance comment: requires physical assist to maintain upright                             Pertinent Vitals/Pain Pain Assessment: No/denies pain    Home Living Family/patient expects to be discharged to:: Private residence Living Arrangements: Spouse/significant other Available Help at Discharge: Family;Available 24 hours/day Type  of Home: House Home Access: Stairs to enter     Home Layout: Two level;Able to live on main level with bedroom/bathroom   Additional Comments: pt poor historian, only able to state she lives in "a beautiful house with her husband and son." Home set up  collected from prior notes.    Prior Function Level of Independence: Needs assistance   Gait / Transfers Assistance Needed: per ED notes uses w/c for mobility  ADL's / Homemaking Assistance Needed: pt requires assist for bADLs and iADLs           Extremity/Trunk Assessment   Upper Extremity Assessment Upper Extremity Assessment: Generalized weakness;Difficult to assess due to impaired cognition    Lower Extremity Assessment Lower Extremity Assessment: Generalized weakness;Difficult to assess due to impaired cognition (good ROM noted in functional mobility, strength grossly 3/5)    Cervical / Trunk Assessment Cervical / Trunk Assessment: Other exceptions (cachetic)  Communication   Communication: HOH;Expressive difficulties (limited verbalization)  Cognition Arousal/Alertness: Awake/alert Behavior During Therapy: WFL for tasks assessed/performed Overall Cognitive Status: Impaired/Different from baseline Area of Impairment: Orientation;Memory;Awareness;Problem solving                 Orientation Level: Disoriented to;Place;Time;Situation   Memory: Decreased short-term memory     Awareness: Emergent Problem Solving: Slow processing;Decreased initiation;Difficulty sequencing;Requires verbal cues;Requires tactile cues General Comments: pt requires increased time with questions and commands, sometimes answering 20-30sec after, and requires multimodal cuing for mobility, did not answer when asked if she used a wheelchair to get around, but then when in standing said wheelchair, wheelchair over and over again      General Comments General comments (skin integrity, edema, etc.): VSS on RA, pt scratching at her fragile skin around her neck        Assessment/Plan    PT Assessment Patient needs continued PT services  PT Problem List Decreased strength;Decreased activity tolerance;Decreased balance;Decreased mobility;Decreased cognition;Decreased safety awareness        PT Treatment Interventions DME instruction;Gait training;Therapeutic activities;Functional mobility training;Therapeutic exercise;Stair training;Balance training;Cognitive remediation    PT Goals (Current goals can be found in the Care Plan section)  Acute Rehab PT Goals Patient Stated Goal: go home PT Goal Formulation: Patient unable to participate in goal setting Time For Goal Achievement: 01/03/21 Potential to Achieve Goals: Fair    Frequency Min 3X/week    AM-PAC PT "6 Clicks" Mobility  Outcome Measure Help needed turning from your back to your side while in a flat bed without using bedrails?: None Help needed moving from lying on your back to sitting on the side of a flat bed without using bedrails?: A Little Help needed moving to and from a bed to a chair (including a wheelchair)?: A Little Help needed standing up from a chair using your arms (e.g., wheelchair or bedside chair)?: A Little Help needed to walk in hospital room?: Total Help needed climbing 3-5 steps with a railing? : Total 6 Click Score: 15    End of Session Equipment Utilized During Treatment: Gait belt Activity Tolerance: Patient tolerated treatment well Patient left: in bed;with call bell/phone within reach;with bed alarm set Nurse Communication: Need for lift equipment;Other (comment) (request for a drink (needs nectar thick)) PT Visit Diagnosis: Other abnormalities of gait and mobility (R26.89);Unsteadiness on feet (R26.81);Muscle weakness (generalized) (M62.81)    Time: [id number] PT Time Calculation (min) (ACUTE ONLY): 23 min   Charges:   PT Evaluation $PT Eval Moderate Complexity: 1 Mod PT Treatments $Therapeutic Activity: 8-22 mins  Elizabeth B. Migdalia Dk PT, DPT Acute Rehabilitation Services Pager [phone] Office [phone]   [place] 12/20/2020, 12:52 PM

## 2020-12-20 NOTE — Evaluation (Signed)
Clinical/Bedside Swallow Evaluation Patient Details  Name: Patricia [name] MRN: [id number] Date of Birth: 05/21/1932  Today's Date: 12/20/2020 Time: SLP Start Time (ACUTE ONLY): 1115 SLP Stop Time (ACUTE ONLY): 1130 SLP Time Calculation (min) (ACUTE ONLY): 15 min  Past Medical History:  Past Medical History:  Diagnosis Date  . Abnormal gait   . Anxiety   . Arthritis   . Asthma   . COPD, mild ([place])   . Coronary artery disease    pt denies  . Depression   . Diverticulitis   . Dizziness   . DVT (deep venous thrombosis) ([place])   . GERD (gastroesophageal reflux disease)   . Hypertension   . Hypothyroidism   . Multiple allergies   . Pneumonia   . Pre-diabetes   . Reflux   . Renal disorder    had kidney transplant in 2009   Past Surgical History:  Past Surgical History:  Procedure Laterality Date  . ANTERIOR APPROACH HEMI HIP ARTHROPLASTY Right 02/10/2016   Procedure: TOTAL HIP ARTHROPLASTY ANTERIOR APPROACH;  Surgeon: Rod Can, MD;  Location: [facility];  Service: Orthopedics;  Laterality: Right;  . CHOLECYSTECTOMY N/A 07/01/2018   Procedure: LAPAROSCOPIC CHOLECYSTECTOMY WITH INTRAOPERATIVE CHOLANGIOGRAM;  Surgeon: Jovita Kussmaul, MD;  Location: [facility];  Service: General;  Laterality: N/A;  . COLONOSCOPY    . NEPHRECTOMY TRANSPLANTED ORGAN    . TUMOR REMOVAL     Brain    HPI:  87yo female admitted 12/19/20 due to failure to thrive. Also found to have COVID. PMH: kidney transplant (2009), pre-DM, hypothyroidism, HTN, CAD, mild COPD, GERD   Assessment / Plan / Recommendation Clinical Impression  Pt presents with cognitively based dysphagia, characterized by reduced awareness, poor oral prep and propulsion, highly variable tolerance of consistencies, and increased risk for inadequate intake and aspiration. Pt is missing dentition upper and lower, and is unable to consistently follow commands for CN examination. Oral motor strength and function appear adequate. Oral care was  completed, following which pt accepted ice chips, thin liquid, nectar thick liquid, and pureed textures. She was unable to demonstrate ability to chew up ice, so solid texture was not presented at this time. RN reported cough response after thin liquids at breakfast this morning. No overt s/s aspiration observed at this time, however, high variability of swallow safety is likely. Pt tolerated nectar thick liquid via straw, and puree via spoon without obvious oral issues or overt s/s aspiration. Will continue with a conservative PO diet of puree and nectar thick liquid, crushed meds. Pt will need 1:1 assistance with all meals due to altered mentation. Safe swallow precautions posted at [facility] and discussed with RN. SLP will continue to follow to assess diet tolerance and determine if diet advancement is appropriate.   SLP Visit Diagnosis: Dysphagia, unspecified (R13.10)    Aspiration Risk  Moderate aspiration risk;Risk for inadequate nutrition/hydration    Diet Recommendation Dysphagia 1 (Puree);Nectar-thick liquid   Liquid Administration via: Straw Medication Administration: Crushed with puree Supervision: Full supervision/cueing for compensatory strategies;Staff to assist with self feeding Compensations: Minimize environmental distractions;Slow rate;Small sips/bites Postural Changes: Seated upright at 90 degrees;Remain upright for at least 30 minutes after po intake    Other  Recommendations Oral Care Recommendations: Oral care BID Other Recommendations: Order thickener from pharmacy   Follow up Recommendations 24 hour supervision/assistance;Skilled Nursing facility      Frequency and Duration min 1 x/week  1 week;2 weeks       Prognosis Prognosis for Safe Diet  Advancement: Guarded Barriers to Reach Goals: Cognitive deficits      Swallow Study   General Date of Onset: 12/19/20 HPI: 87yo female admitted 12/19/20 due to failure to thrive. Also found to have COVID. PMH: kidney transplant  (2009), pre-DM, hypothyroidism, HTN, CAD, mild COPD, GERD Type of Study: Bedside Swallow Evaluation Previous Swallow Assessment: none Diet Prior to this Study: Other (Comment) (full liquid) Temperature Spikes Noted: No Respiratory Status: Room air History of Recent Intubation: No Behavior/Cognition: Alert;Cooperative;Doesn't follow directions;Requires cueing Oral Cavity Assessment: Dry Oral Care Completed by SLP: Yes Oral Cavity - Dentition: Missing dentition;Poor condition Vision: Functional for self-feeding Self-Feeding Abilities: Total assist Patient Positioning: Upright in bed Baseline Vocal Quality: Normal Volitional Cough: Cognitively unable to elicit Volitional Swallow: Unable to elicit    Oral/Motor/Sensory Function Overall Oral Motor/Sensory Function: Generalized oral weakness   Ice Chips Ice chips: Impaired Oral Phase Impairments: Reduced labial seal;Reduced lingual movement/coordination;Impaired mastication;Poor awareness of bolus Oral Phase Functional Implications: Other (comment) (pt spat out ice chip)   Thin Liquid Thin Liquid: Within functional limits Presentation: Straw Other Comments: RN reports coughing with thin liquids this morning. None noted at this time, however, pt safety for thin liquids may be variable with alterations in mentation throughout the day    Nectar Thick Nectar Thick Liquid: Within functional limits Presentation: Straw   Puree Puree: Within functional limits Presentation: Spoon   Solid     Solid: Not tested (pt unable to demonstrate ability to masticate solid textures at this time)     Toinette Lackie B. Quentin Ore, [facility], [place] Speech Language Pathologist Office: [phone]  Shonna Chock 12/20/2020,2:00 PM

## 2020-12-20 NOTE — Progress Notes (Addendum)
PROGRESS NOTE                                                                                                                                                                                                             Patient Demographics:    Patricia [name], is a 87 y.o. female, DOB - 1931/11/05, [id number]  Outpatient Primary MD for the patient is [name], MD    LOS - 0  Admit date - 12/19/2020    Chief Complaint  Patient presents with  . Failure To Thrive       Brief Narrative (HPI from H&P) - Patricia [name] is a 87 y.o. female with medical history significant of kidney transplant (2009); pre-DM; hypothyroidism; HTN; CAD; and mild COPD presenting with failure to thrive.  Her husband reports that up to 3 weeks ago she was cognitively intact and able to mobilize with a wheelchair, apparently she has been gradually declining at home and not eating and drinking well and losing weight, in the ER she was found to be dehydrated, extremely weak and cachectic to have incidental COVID-19 infection admitted to the hospital   Subjective:    Patricia [name] today has, No headache, No chest pain, No abdominal pain - No Nausea, No new weakness tingling or numbness, no SOB, however she is quite confused and appears to be a very poor historian   Assessment  & Plan :    1. Incidental COVID-19 infection.  No hypoxia, chest x-ray stable.  CRP suggest mild inflammation agree with moderate dose steroids for now.  Short course probably appropriate, note she is chronically on oral steroids.  Encouraged the patient to sit up in chair in the daytime use I-S and flutter valve for pulmonary toiletry and then prone in bed when at night.  Will advance activity and titrate down oxygen as possible.   SpO2: 94 % O2 Flow Rate (L/min): 0 L/min FiO2 (%): 21 %  2.  Progressive dementia, failure to thrive, severe dehydration and cachexia.  Extremely  poor prognosis, likely has progressive failure to thrive due to advanced dementia, palliative care on board, she is DNR, per family if further significant decline comfort measures.  They want to try gentle medical treatment for a few days which I think is appropriate.  Continue IV fluids, PT-OT and speech eval.  3.  History of DVT.  On Eliquis.  Stop trending D-dimer as it will not change management much.  4.  Anemia of chronic disease.  Monitor, no signs of acute bleeding.  Will place on PPI.  She is not a candidate for aggressive anemia work-up.  We will continue oral iron supplementation which she takes at home. Recent Hb has been around 8.5, 14 yesterday either an error or Haem concentration, repeat CBC, Type screen and monnitor.  Results for Patricia [name], Patricia [name] (MRN [id number]) as of 12/20/2020 15:48  Ref. Range 04/26/2020 00:00 12/19/2020 11:40 12/20/2020 07:21  Hemoglobin Latest Ref Range: 12.0 - 15.0 g/dL 8.8 (A) 14.2 8.1 (L)  HCT Latest Ref Range: 36.0 - 46.0 % 26 (A) 42.1 23.6 (L)    5.  Dyslipidemia.  On statin  6.  Hypothyroidism.  On Synthroid.  TSH stable.  7.  COPD.  No acute issues supportive care.  8.  History of underlying diastolic CHF.  Chronic.  Compensated.  EF 65% on previous echocardiogram  9.  CKD stage IV history of renal transplant.  Continue Imuran and cyclosporine.  She is chronically on Bactrim 3 times a week which is also continued.  With gentle hydration renal function has improved.  Creatinine appears to be close to 1.6.  Kindly continue oral steroids which she takes chronically upon discharge.  10.  Hypertension.  Continue clonidine for now.     Recent Labs  Lab 12/19/20 1140 12/19/20 1419 12/19/20 1455 12/19/20 1940 12/20/20 0721  WBC 11.1*  --   --   --  9.3  HGB 14.2  --   --   --  8.1*  HCT 42.1  --   --   --  23.6*  PLT 307  --   --   --  250  CRP  --   --   --  8.0* 12.0*  BNP  --   --   --   --  568.7*  DDIMER  --   --   --  3.69* 7.15*   PROCALCITON  --   --   --  0.71 0.96  AST  --   --  27  --  18  ALT  --   --  8  --  9  ALKPHOS  --   --  74  --  61  BILITOT  --   --  1.5*  --  0.8  ALBUMIN  --   --  3.2*  --  2.7*  SARSCOV2NAA  --  POSITIVE*  --   --   --    Lab Results  Component Value Date   TSH 0.587 12/19/2020         Condition - Extremely Guarded  Family Communication  :  None present, palliative care today had detailed discussions with patient's husband.  Code Status : DNR  Consults  : Palliative care  PUD Prophylaxis : None   Procedures  :            Disposition Plan  :    Status is: Observation  Dispo: The patient is from: Home              Anticipated d/c is to: Home              Patient currently is not medically stable to d/c.   Difficult to place patient No   DVT Prophylaxis  :  Eliquis  Lab Results  Component Value Date   PLT 250  12/20/2020    Diet :  Diet Order            Diet full liquid Room service appropriate? Yes; Fluid consistency: Thin  Diet effective now                  Inpatient Medications  Scheduled Meds: . apixaban  2.5 mg Oral BID  . azaTHIOprine  50 mg Oral BID  . chlorhexidine  15 mL Mouth Rinse BID  . cloNIDine  0.1 mg Oral BID  . cycloSPORINE modified  100 mg Oral BID  . docusate sodium  100 mg Oral BID  . FLUoxetine  20 mg Oral Daily  . fluticasone furoate-vilanterol  1 puff Inhalation Daily  . hydrocortisone sod succinate (SOLU-CORTEF) inj  50 mg Intravenous Q12H  . levothyroxine  75 mcg Oral Q0600  . mouth rinse  15 mL Mouth Rinse q12n4p  . mirabegron ER  50 mg Oral Daily  . sodium chloride flush  3 mL Intravenous Q12H  . [START ON 12/21/2020] sulfamethoxazole-trimethoprim  1 tablet Oral Q M,W,F   Continuous Infusions: . lactated ringers    . remdesivir 100 mg in NS 100 mL 100 mg (12/20/20 0903)   PRN Meds:.acetaminophen **OR** acetaminophen, albuterol, bisacodyl, hydrALAZINE, [DISCONTINUED] ondansetron **OR** ondansetron (ZOFRAN)  IV, polyethylene glycol  Antibiotics  :    Anti-infectives (From admission, onward)   Start     Dose/Rate Route Frequency Ordered Stop   12/21/20 1000  sulfamethoxazole-trimethoprim (BACTRIM) 400-80 MG per tablet 1 tablet        1 tablet Oral Every M-W-F 12/19/20 1627     12/20/20 1000  remdesivir 100 mg in sodium chloride 0.9 % 100 mL IVPB       "Followed by" Linked Group Details   100 mg 200 mL/hr over 30 Minutes Intravenous Daily 12/19/20 1801 12/22/20 0959   12/19/20 1815  remdesivir 200 mg in sodium chloride 0.9% 250 mL IVPB       "Followed by" Linked Group Details   200 mg 580 mL/hr over 30 Minutes Intravenous Once 12/19/20 1801 12/20/20 0120       Time Spent in minutes  30   Lala Lund M.D on 12/20/2020 at 11:37 AM  To page go to [email or web address]   [facility] -  Office  [phone]   See all Orders from today for further details    Objective:   Vitals:   12/19/20 2322 12/20/20 0416 12/20/20 0740 12/20/20 0947  BP: (!) 142/67 (!) 168/80 (!) 178/66 129/64  Pulse: 78 82 78   Resp: '19 20 17   '$ Temp: 98.5 F (36.9 C) 98.6 F (37 C) 98.7 F (37.1 C) 98.7 F (37.1 C)  TempSrc: Oral Oral Oral Oral  SpO2: 98% 97% 94%   Weight:      Height:        Wt Readings from Last 3 Encounters:  12/19/20 43.9 kg  04/27/20 51.3 kg  04/15/20 51.3 kg     Intake/Output Summary (Last 24 hours) at 12/20/2020 1137 Last data filed at 12/20/2020 [id number] Gross per 24 hour  Intake 2259.85 ml  Output --  Net 2259.85 ml     Physical Exam  Frail cachectic elderly female sitting in hospital bed quite confused, moving all 4 extremities Patricia [name].AT,PERRAL Supple Neck,No JVD, No cervical lymphadenopathy appriciated.  Symmetrical Chest wall movement, Good air movement bilaterally, CTAB RRR,No Gallops,Rubs or new Murmurs, No Parasternal Heave +ve B.Sounds, Abd Soft, No tenderness, No organomegaly appriciated, No rebound - guarding  or rigidity. No Cyanosis, Clubbing or edema, No new  Rash or bruise       Data Review:    CBC Recent Labs  Lab 12/19/20 1140 12/20/20 0721  WBC 11.1* 9.3  HGB 14.2 8.1*  HCT 42.1 23.6*  PLT 307 250  MCV 96.1 98.3  MCH 32.4 33.8  MCHC 33.7 34.3  RDW 13.4 13.3    Recent Labs  Lab 12/19/20 1140 12/19/20 1455 12/19/20 1638 12/19/20 1940 12/20/20 0721  NA 143  --   --   --  143  K 3.7  --   --   --  3.7  CL 111  --   --   --  114*  CO2 16*  --   --   --  19*  GLUCOSE 180*  --   --   --  136*  BUN 51*  --   --   --  46*  CREATININE 2.16*  --   --   --  1.87*  CALCIUM 10.3  --   --   --  9.5  AST  --  27  --   --  18  ALT  --  8  --   --  9  ALKPHOS  --  74  --   --  61  BILITOT  --  1.5*  --   --  0.8  ALBUMIN  --  3.2*  --   --  2.7*  MG  --   --   --   --  2.1  CRP  --   --   --  8.0* 12.0*  DDIMER  --   --   --  3.69* 7.15*  PROCALCITON  --   --   --  0.71 0.96  TSH  --   --  0.587  --   --   BNP  --   --   --   --  568.7*    ------------------------------------------------------------------------------------------------------------------ No results for input(s): CHOL, HDL, LDLCALC, TRIG, CHOLHDL, LDLDIRECT in the last 72 hours.  Lab Results  Component Value Date   HGBA1C 5.1 07/01/2018   ------------------------------------------------------------------------------------------------------------------ Recent Labs    12/19/20 1638  TSH 0.587    Cardiac Enzymes No results for input(s): CKMB, TROPONINI, MYOGLOBIN in the last 168 hours.  Invalid input(s): CK ------------------------------------------------------------------------------------------------------------------    Component Value Date/Time   BNP 568.7 (H) 12/20/2020 [id number]    Micro Results Recent Results (from the past 240 hour(s))  Resp Panel by RT-PCR (Flu A&B, Covid) Nasopharyngeal Swab     Status: Abnormal   Collection Time: 12/19/20  2:19 PM   Specimen: Nasopharyngeal Swab; Nasopharyngeal(NP) swabs in vial transport medium  Result  Value Ref Range Status   SARS Coronavirus 2 by RT PCR POSITIVE (A) NEGATIVE Final    Comment: RESULT CALLED TO, READ BACK BY AND VERIFIED WITH: RN Jarrett Soho A [id number].  (NOTE) SARS-CoV-2 target nucleic acids are DETECTED.  The SARS-CoV-2 RNA is generally detectable in upper respiratory specimens during the acute phase of infection. Positive results are indicative of the presence of the identified virus, but do not rule out bacterial infection or co-infection with other pathogens not detected by the test. Clinical correlation with patient history and other diagnostic information is necessary to determine patient infection status. The expected result is Negative.  Fact Sheet for Patients: EntrepreneurPulse.com.au  Fact Sheet for Healthcare Providers: IncredibleEmployment.be  This test is not yet approved or cleared by the [place] FDA  and  has been authorized for detection and/or diagnosis of SARS-CoV-2 by FDA under an Emergency Use Authorization (EUA).  This EUA will remain in effect (meaning this test can be use d) for the duration of  the COVID-19 declaration under Section 564(b)(1) of the Act, 21 U.S.C. section 360bbb-3(b)(1), unless the authorization is terminated or revoked sooner.     Influenza A by PCR NEGATIVE NEGATIVE Final   Influenza B by PCR NEGATIVE NEGATIVE Final    Comment: (NOTE) The Xpert Xpress SARS-CoV-2/FLU/RSV plus assay is intended as an aid in the diagnosis of influenza from Nasopharyngeal swab specimens and should not be used as a sole basis for treatment. Nasal washings and aspirates are unacceptable for Xpert Xpress SARS-CoV-2/FLU/RSV testing.  Fact Sheet for Patients: EntrepreneurPulse.com.au  Fact Sheet for Healthcare Providers: IncredibleEmployment.be  This test is not yet approved or cleared by the [place] FDA and has been authorized for detection and/or  diagnosis of SARS-CoV-2 by FDA under an Emergency Use Authorization (EUA). This EUA will remain in effect (meaning this test can be used) for the duration of the COVID-19 declaration under Section 564(b)(1) of the Act, 21 U.S.C. section 360bbb-3(b)(1), unless the authorization is terminated or revoked.  Performed at [facility], [place] [address], [place], [place] [address]     Radiology Reports DG Chest Portable 1 View  Result Date: 12/19/2020 CLINICAL DATA:  Altered mental status EXAM: PORTABLE CHEST 1 VIEW COMPARISON:  04/11/2020 FINDINGS: Hyperinflation. No new consolidation or edema. Left lung calcified granuloma is again identified. No pleural effusion. Stable cardiomediastinal contours with normal heart size. IMPRESSION: No acute process in the chest. Electronically Signed   By: Macy Mis M.D.   On: 12/19/2020 14:52

## 2020-12-20 NOTE — Progress Notes (Signed)
Initial Nutrition Assessment  DOCUMENTATION CODES:  Severe malnutrition in context of social or environmental circumstances  INTERVENTION:  Ensure Enlive po TID, each supplement provides 350 kcal and 20 grams of protein.  Magic cup TID with meals, each supplement provides 290 kcal and 9 grams of protein.  Add MVI with minerals daily.  NUTRITION DIAGNOSIS:  Severe Malnutrition related to social / environmental circumstances as evidenced by severe fat depletion,severe muscle depletion,percent weight loss.  GOAL:  Patient will meet greater than or equal to 90% of their needs  MONITOR:  PO intake,Supplement acceptance  REASON FOR ASSESSMENT:  Consult  (nutritional goals)  ASSESSMENT:  87 yo female with a PMH of s/p kidney transplant (2009), pre-DM, hypothyroidism, HTN, and mild COPD presents with adult FFT. Of note, pt is COVID+. Husband reports up until 3 weeks ago, pt was cognitively intact and uses wheelchair. Not been ambulatory in a year. In the last 3 weeks, has refused to get out of bed, refusing all food and drinking very little (one fruit cup and one cup of tea per day). Considering SNF placement.  Weight was 112 lbs in 7/21, but now weighs 97 lbs, which is a 16 lb weight loss (14.4%) in 8 months, which is significant.  Pt unable to give any history. Spoke with nurse outside her room, pt only remembering her name. Just transitioned to Dysphagia 1 with nectar thick liquids.  Per previous palliative care note from last admission, pt is has declined a feeding tube.  Recommend Ensure Enlive, Magic Cups, and MVI to inhibit anymore fat and muscle loss.  Relevant Medications: colace BID, LR @ 75 ml Labs: reviewed; Glucose 136,   NUTRITION - FOCUSED PHYSICAL EXAM: Flowsheet Row Most Recent Value  Orbital Region Severe depletion  Upper Arm Region Severe depletion  Thoracic and Lumbar Region Severe depletion  Buccal Region Severe depletion  Temple Region Severe depletion   Clavicle Bone Region Severe depletion  Clavicle and Acromion Bone Region Severe depletion  Scapular Bone Region Severe depletion  Dorsal Hand Severe depletion  Patellar Region Severe depletion  Anterior Thigh Region Severe depletion  Posterior Calf Region Severe depletion  Edema (RD Assessment) None  Hair Reviewed  Eyes Reviewed  Mouth Reviewed  Skin Reviewed  Nails Reviewed     Diet Order:   Diet Order            DIET - DYS 1 Room service appropriate? No; Fluid consistency: Nectar Thick  Diet effective now                EDUCATION NEEDS:  Not appropriate for education at this time  Skin:  Skin Assessment: Reviewed RN Assessment  Last BM:  PTA  Height:  Ht Readings from Last 1 Encounters:  12/19/20 '5\' 1"'$  (1.549 m)   Weight:  Wt Readings from Last 1 Encounters:  12/19/20 43.9 kg   Ideal Body Weight:  47.7 kg  BMI:  Body mass index is 18.29 kg/m.  Estimated Nutritional Needs:  Kcal:  1600-1800 Protein:  70-85 grams Fluid:  >1.5 L  Derrel Nip, RD, LDN Registered Dietitian After Hours/Weekend Pager # in [place]

## 2020-12-20 NOTE — Progress Notes (Signed)
Occupational Therapy Evaluation Patient Details Name: Patricia [name] MRN: [id number] DOB: [birth year] Today's Date: 12/20/2020    History of Present Illness 87 y.o. female presenting to ED with failure to thrive, admitted for observation 12/19/20. Family reports until 3wks ago cognitively intact using w/c to mobilize. Pt has been non-ambulatory for over a yesr. Now she is non-verbal, refusing food and drink. Found to have incidental COVID+ infection. [place]:[id number] transplant (2009); pre-DM; hypothyroidism; HTN; CAD; and mild COPD   Clinical Impression   Pt from home where she lived with her husband [name]). Until @ 3 weeks ago, pt was transferring to her wc and helping with ADL tasks, including meal prep. Per chart, pt has essentially been in bed for 3 weeks. Able to progress to EOB with min A however required Max A to step pivot into recliner, significant posterior push and apparent fear of falling. Pt requires overall Max A with ADL tasks. Once OOB in chair, pt more alert and conversational. If plan is to DC home, husband will need to have additional physical help. If husband is unable to assist at this level, pt will need SNF. Will follow acutely.     Follow Up Recommendations  Home health OT;Supervision/Assistance - 24 hour;SNF (Pt may need SNF)    Equipment Recommendations  None recommended by OT    Recommendations for Other Services       Precautions / Restrictions Precautions Precautions: Fall      Mobility Bed Mobility Overal bed mobility: Needs Assistance Bed Mobility: Supine to Sit     Supine to sit: Min assist          Transfers Overall transfer level: Needs assistance Equipment used: Rolling walker (2 wheeled) Transfers: Sit to/from [facility] Sit to Stand: Mod assist Stand pivot transfers: Max assist            Balance Overall balance assessment: Needs assistance Sitting-balance support: Feet supported;No upper extremity supported Sitting  balance-Leahy Scale: Fair     Standing balance support: Bilateral upper extremity supported;During functional activity Standing balance-Leahy Scale: Poor Standing balance comment: posterior bias                           ADL either performed or assessed with clinical judgement   ADL Overall ADL's : Needs assistance/impaired Eating/Feeding: Maximal assistance (nectar thick liquids)   Grooming: Moderate assistance;Sitting   Upper Body Bathing: Moderate assistance;Sitting   Lower Body Bathing: Total assistance;Bed level   Upper Body Dressing : Maximal assistance;Sitting   Lower Body Dressing: Total assistance               Functional mobility during ADLs: Maximal assistance (stand pivot to chair)       Vision   Additional Comments: unsure     Perception Perception Comments: appears to have imppaired spatial orientation   Praxis      Pertinent Vitals/Pain Pain Assessment: Faces Faces Pain Scale: Hurts little more Pain Location: when standing Pain Descriptors / Indicators: Guarding;Moaning Pain Intervention(s): Limited activity within patient's tolerance     Hand Dominance Right   Extremity/Trunk Assessment Upper Extremity Assessment Upper Extremity Assessment: Generalized weakness   Lower Extremity Assessment Lower Extremity Assessment: Defer to PT evaluation   Cervical / Trunk Assessment Cervical / Trunk Assessment: Other exceptions;Kyphotic (posterior push)   Communication Communication Communication: HOH;Expressive difficulties (limited verbalization)   Cognition Arousal/Alertness: Awake/alert Behavior During Therapy: Anxious Overall Cognitive Status: No family/caregiver present to determine baseline cognitive  functioning Area of Impairment: Orientation;Memory;Awareness;Problem solving;Attention;Following commands;Safety/judgement                 Orientation Level: Disoriented to;Place;Time;Situation Current Attention Level:  Sustained Memory: Decreased short-term memory Following Commands: Follows one step commands with increased time Safety/Judgement: Decreased awareness of deficits;Decreased awareness of safety Awareness: Emergent Problem Solving: Slow processing;Decreased initiation;Difficulty sequencing;Requires verbal cues;Requires tactile cues General Comments: Pt severely HOH - difficult to acurately assess cognition. Once OOB in chair, pt more alert and answering quesitons. Pt read my nametag on my picture name badge, thanked me for getting her OOB to chair and told me to have a nice day; pt told me she was not suppose to drink water - only "thick stuff"   General Comments  VSS on RA    Exercises     Shoulder Instructions      Home Living Family/patient expects to be discharged to:: Private residence Living Arrangements: Spouse/significant other Available Help at Discharge: Family;Available 24 hours/day Type of Home: House Home Access: Stairs to enter     Home Layout: Two level;Able to live on main level with bedroom/bathroom     Bathroom Shower/Tub: Occupational psychologist: Handicapped height         Additional Comments: pt poor historian, only able to state she lives in "a beautiful house with her husband and son." Home set up collected from prior notes.      Prior Functioning/Environment Level of Independence: Needs assistance  Gait / Transfers Assistance Needed: per ED notes uses w/c for mobility ADL's / Homemaking Assistance Needed: pt requires assist for bADLs and iADLs   Comments: was mostly at wc level, helping with meal prep unti @ 3 weeks ago, since that time has essentailly been mostly in bed per chart        OT Problem List: Decreased strength;Decreased range of motion;Decreased activity tolerance;Impaired balance (sitting and/or standing);Decreased coordination;Decreased cognition;Decreased safety awareness;Decreased knowledge of use of DME or AE;Cardiopulmonary  status limiting activity;Pain      OT Treatment/Interventions: Self-care/ADL training;Therapeutic exercise;Neuromuscular education;Energy conservation;DME and/or AE instruction;Therapeutic activities;Cognitive remediation/compensation;Patient/family education;Balance training    OT Goals(Current goals can be found in the care plan section) Acute Rehab OT Goals Patient Stated Goal: to get something to drink OT Goal Formulation: With patient Time For Goal Achievement: 01/03/21 Potential to Achieve Goals: Good  OT Frequency: Min 2X/week   Barriers to D/C:            Co-evaluation              AM-PAC OT "6 Clicks" Daily Activity     Outcome Measure Help from another person eating meals?: A Lot Help from another person taking care of personal grooming?: A Lot Help from another person toileting, which includes using toliet, bedpan, or urinal?: Total Help from another person bathing (including washing, rinsing, drying)?: A Lot Help from another person to put on and taking off regular upper body clothing?: A Lot Help from another person to put on and taking off regular lower body clothing?: Total 6 Click Score: 10   End of Session Nurse Communication: Mobility status  Activity Tolerance: Patient tolerated treatment well Patient left: in chair;with call bell/phone within reach;with chair alarm set  OT Visit Diagnosis: Unsteadiness on feet (R26.81);Other abnormalities of gait and mobility (R26.89);Muscle weakness (generalized) (M62.81);Other symptoms and signs involving cognitive function;Pain Pain - part of body:  (generalized)  Time: [id number] OT Time Calculation (min): 25 min Charges:  OT General Charges $OT Visit: 1 Visit OT Evaluation $OT Eval Moderate Complexity: 1 Mod OT Treatments $Self Care/Home Management : 8-22 mins  Maurie Boettcher, OT/L   Acute OT Clinical Specialist Acute Rehabilitation Services Pager [phone] Office  [phone]   [facility] 12/20/2020, 6:48 PM

## 2020-12-20 NOTE — Evaluation (Signed)
Speech Language Pathology Evaluation Patient Details Name: Patricia [name] MRN: [id number] DOB: 1932-02-22 Today's Date: 12/20/2020 Time: [id number] SLP Time Calculation (min) (ACUTE ONLY): 15 min  Problem List:  Patient Active Problem List   Diagnosis Date Noted  . Dehydration   . Failure to thrive in adult   . Palliative care by specialist   . Goals of care, counseling/discussion   . Pain in both upper extremities 04/11/2020  . History of anemia due to CKD 12/31/2019  . Anemia in chronic kidney disease (CKD) 12/31/2019  . Generalized weakness 12/30/2019  . Cholecystitis with cholelithiasis 07/01/2018  . Postoperative anemia due to acute blood loss 02/15/2016  . Hyperlipidemia 02/15/2016  . Displaced fracture of right femoral neck ([place]) 02/10/2016  . Chronic diastolic CHF (congestive heart failure) ([place]) 02/08/2016  . Closed right hip fracture ([place]) 02/08/2016  . Hip fracture ([place]) 02/08/2016  . Acute diastolic CHF (congestive heart failure) ([place]) 05/27/2015  . Urinary incontinence 05/27/2015  . Escherichia coli urinary tract infection   . Other emphysema ([place])   . Hypokalemia   . Hypomagnesemia   . Sepsis secondary to UTI ([place]) 05/22/2015  . DVT, lower extremity, proximal ([place]) 03/02/2014  . Chronic kidney disease, stage 4, severely decreased GFR ([facility]) 03/02/2014  . Essential hypertension 03/02/2014  . DVT (deep venous thrombosis) ([place]) 03/01/2014  . Acute renal failure superimposed on stage 3 chronic kidney disease ([place]) 03/01/2014  . Gastroenteritis 11/22/2013  . Nausea & vomiting 11/22/2013  . Hypercalcemia 11/22/2013  . Infection due to ESBL-producing Escherichia coli 10/07/2013  . Septicemia due to E. coli ([place]) 10/07/2013  . Urinary tract infection 10/05/2013  . Sepsis ([employer]) 10/05/2013  . History of renal transplant   . Thyroid disease   . Multiple allergies   . Abnormal gait   . Reflux   . Diverticulitis   . Hypothyroidism   . Depression   . Anxiety   . COPD  (chronic obstructive pulmonary disease) ([place])   . Dizziness   . Diabetes type 2, controlled ([place])    Past Medical History:  Past Medical History:  Diagnosis Date  . Abnormal gait   . Anxiety   . Arthritis   . Asthma   . COPD, mild ([place])   . Coronary artery disease    pt denies  . Depression   . Diverticulitis   . Dizziness   . DVT (deep venous thrombosis) ([place])   . GERD (gastroesophageal reflux disease)   . Hypertension   . Hypothyroidism   . Multiple allergies   . Pneumonia   . Pre-diabetes   . Reflux   . Renal disorder    had kidney transplant in 2009   Past Surgical History:  Past Surgical History:  Procedure Laterality Date  . ANTERIOR APPROACH HEMI HIP ARTHROPLASTY Right 02/10/2016   Procedure: TOTAL HIP ARTHROPLASTY ANTERIOR APPROACH;  Surgeon: Rod Can, MD;  Location: [facility];  Service: Orthopedics;  Laterality: Right;  . CHOLECYSTECTOMY N/A 07/01/2018   Procedure: LAPAROSCOPIC CHOLECYSTECTOMY WITH INTRAOPERATIVE CHOLANGIOGRAM;  Surgeon: Jovita Kussmaul, MD;  Location: [facility];  Service: General;  Laterality: N/A;  . COLONOSCOPY    . NEPHRECTOMY TRANSPLANTED ORGAN    . TUMOR REMOVAL     Brain    HPI:  87yo female admitted 12/19/20 due to failure to thrive. Also found to have COVID. PMH: kidney transplant (2009), pre-DM, hypothyroidism, HTN, CAD, mild COPD, GERD   Assessment / Plan / Recommendation Clinical Impression  The Mini-Mental State Examination (MMSE) was  administered. Pt scored 2/30, indicating significant cognitive impairment. Points earned on naming objects and following 1-step direction (given max cues)/ Pt had difficulty responding to all questions, but was able to verbalize her full name. She was otherwise not oriented to place, time, or situation, and could not participate in immediate/delayed recall tasks. or attention/calculation task.  Pt named 1/2 objects, and inconsistently followed very simple directions with max multimodal cues. Perseverative  responses were noted. Pt voice quality was clear, and her speech was fully intelligible. It is anticipated that pt's level of cognitive linguistic function will be highly variable throughout the day. 24 hour close supservision is recommended to maximize safety. Recommend follow up with speech therapy for continued evaluation and establishment of appropriate goals of care at next venue. No further acute ST intervention recommended for cognitive linguistic skills    SLP Assessment  SLP Recommendation/Assessment: All further Speech Language Pathology needs can be addressed in the next venue of care  SLP Visit Diagnosis: Cognitive communication deficit (R41.841)    Follow Up Recommendations  24 hour supervision/assistance;Skilled Nursing facility    Frequency and Duration min 1 x/week         SLP Evaluation Cognition  Overall Cognitive Status: No family/caregiver present to determine baseline cognitive functioning Arousal/Alertness: Awake/alert Orientation Level: Oriented to person;Disoriented to place;Disoriented to time;Disoriented to situation Attention: Focused Focused Attention: Appears intact Memory: Impaired Memory Impairment: Storage deficit;Retrieval deficit;Decreased recall of new information;Decreased short term memory Decreased Short Term Memory: Verbal basic       Comprehension  Auditory Comprehension Other Conversation Comments: inconsistent ability to follow directions and answer yes/no questions; extended processing prior to responses Interfering Components: Processing speed;Working memory    Expression Expression Primary Mode of Expression: Verbal Verbal Expression Other Verbal Expression Comments: variable responses to questions. Able to name some objects and tell me her name. Perseverative responses noted. Written Expression Dominant Hand: Right   Oral / Motor  Oral Motor/Sensory Function Overall Oral Motor/Sensory Function: Generalized oral weakness Motor  Speech Overall Motor Speech: Appears within functional limits for tasks assessed Intelligibility: Intelligible   GO                   Krishana Lutze B. Quentin Ore, [facility], [place] Speech Language Pathologist Office: [phone]  Shonna Chock 12/20/2020, 2:12 PM

## 2020-12-21 DIAGNOSIS — E1122 Type 2 diabetes mellitus with diabetic chronic kidney disease: Secondary | ICD-10-CM

## 2020-12-21 DIAGNOSIS — E46 Unspecified protein-calorie malnutrition: Secondary | ICD-10-CM | POA: Insufficient documentation

## 2020-12-21 DIAGNOSIS — E43 Unspecified severe protein-calorie malnutrition: Secondary | ICD-10-CM | POA: Insufficient documentation

## 2020-12-21 LAB — CBC WITH DIFFERENTIAL/PLATELET
Abs Immature Granulocytes: 0.02 10*3/uL (ref 0.00–0.07)
Basophils Absolute: 0 10*3/uL (ref 0.0–0.1)
Basophils Relative: 0 %
Eosinophils Absolute: 0 10*3/uL (ref 0.0–0.5)
Eosinophils Relative: 0 %
HCT: 30.1 % — ABNORMAL LOW (ref 36.0–46.0)
Hemoglobin: 10.5 g/dL — ABNORMAL LOW (ref 12.0–15.0)
Immature Granulocytes: 0 %
Lymphocytes Relative: 12 %
Lymphs Abs: 0.7 10*3/uL (ref 0.7–4.0)
MCH: 32.6 pg (ref 26.0–34.0)
MCHC: 34.9 g/dL (ref 30.0–36.0)
MCV: 93.5 fL (ref 80.0–100.0)
Monocytes Absolute: 0.5 10*3/uL (ref 0.1–1.0)
Monocytes Relative: 8 %
Neutro Abs: 4.6 10*3/uL (ref 1.7–7.7)
Neutrophils Relative %: 80 %
Platelets: 180 10*3/uL (ref 150–400)
RBC: 3.22 MIL/uL — ABNORMAL LOW (ref 3.87–5.11)
RDW: 13.2 % (ref 11.5–15.5)
WBC: 5.8 10*3/uL (ref 4.0–10.5)
nRBC: 0 % (ref 0.0–0.2)

## 2020-12-21 LAB — COMPREHENSIVE METABOLIC PANEL
ALT: 9 U/L (ref 0–44)
AST: 17 U/L (ref 15–41)
Albumin: 2.2 g/dL — ABNORMAL LOW (ref 3.5–5.0)
Alkaline Phosphatase: 49 U/L (ref 38–126)
Anion gap: 9 (ref 5–15)
BUN: 50 mg/dL — ABNORMAL HIGH (ref 8–23)
CO2: 17 mmol/L — ABNORMAL LOW (ref 22–32)
Calcium: 9.1 mg/dL (ref 8.9–10.3)
Chloride: 115 mmol/L — ABNORMAL HIGH (ref 98–111)
Creatinine, Ser: 1.62 mg/dL — ABNORMAL HIGH (ref 0.44–1.00)
GFR, Estimated: 31 mL/min — ABNORMAL LOW (ref >60)
Glucose, Bld: 154 mg/dL — ABNORMAL HIGH (ref 70–99)
Potassium: 2.8 mmol/L — ABNORMAL LOW (ref 3.5–5.1)
Sodium: 141 mmol/L (ref 135–145)
Total Bilirubin: 0.5 mg/dL (ref 0.3–1.2)
Total Protein: 5.8 g/dL — ABNORMAL LOW (ref 6.5–8.1)

## 2020-12-21 LAB — CBC
HCT: 23.6 % — ABNORMAL LOW (ref 36.0–46.0)
Hemoglobin: 8.1 g/dL — ABNORMAL LOW (ref 12.0–15.0)
MCH: 33.8 pg (ref 26.0–34.0)
MCHC: 34.3 g/dL (ref 30.0–36.0)
MCV: 98.3 fL (ref 80.0–100.0)
Platelets: 250 10*3/uL (ref 150–400)
RBC: 2.4 MIL/uL — ABNORMAL LOW (ref 3.87–5.11)
RDW: 13.3 % (ref 11.5–15.5)
WBC: 9.3 10*3/uL (ref 4.0–10.5)
nRBC: 0 % (ref 0.0–0.2)

## 2020-12-21 LAB — C-REACTIVE PROTEIN: CRP: 7.9 mg/dL — ABNORMAL HIGH (ref <1.0)

## 2020-12-21 LAB — MAGNESIUM: Magnesium: 2 mg/dL (ref 1.7–2.4)

## 2020-12-21 LAB — BRAIN NATRIURETIC PEPTIDE: B Natriuretic Peptide: 723.1 pg/mL — ABNORMAL HIGH (ref 0.0–100.0)

## 2020-12-21 LAB — PROCALCITONIN: Procalcitonin: 0.92 ng/mL

## 2020-12-21 MED ORDER — POTASSIUM CHLORIDE CRYS ER 20 MEQ PO TBCR
30.0000 meq | EXTENDED_RELEASE_TABLET | ORAL | Status: AC
  Administered 2020-12-21 (×3): 30 meq via ORAL
  Filled 2020-12-21 (×3): qty 1

## 2020-12-21 MED ORDER — LIP MEDEX EX OINT
1.0000 "application " | TOPICAL_OINTMENT | CUTANEOUS | Status: DC | PRN
  Administered 2020-12-21: 1 via TOPICAL
  Filled 2020-12-21: qty 7

## 2020-12-21 NOTE — Progress Notes (Signed)
PROGRESS NOTE                                                                                                                                                                                                             Patient Demographics:    Patricia [name], is a 87 y.o. female, DOB - 1932-04-20, [id number]  Outpatient Primary MD for the patient is [name], MD    LOS - 1  Admit date - 12/19/2020    Chief Complaint  Patient presents with  . Failure To Thrive       Brief Narrative (HPI from H&P)  - Patricia [name] is a 87 y.o. female with medical history significant of kidney transplant (2009); pre-DM; hypothyroidism; HTN; CAD; and mild COPD presenting with failure to thrive.  Her husband reports that up to 3 weeks ago she was cognitively intact and able to mobilize with a wheelchair, apparently she has been gradually declining at home and not eating and drinking well and losing weight, in the ER she was found to be dehydrated, extremely weak and cachectic to have incidental COVID-19 infection admitted to the hospital   Subjective:    [name] today herself denies any complaints, he is confused, demented, no significant events overnight as discussed with staff.  She is having very poor appetite..   Assessment  & Plan :   Incidental COVID-19 infection.   -No hypoxia, chest x-ray stable.  CRP suggest mild inflammation agree with moderate dose steroids for now.  Short course probably appropriate, note she is chronically on oral steroids.  Encouraged the patient to sit up in chair in the daytime use I-S and flutter valve for pulmonary toiletry and then prone in bed when at night.  Will advance activity and titrate down oxygen as possible.   SpO2: 100 % O2 Flow Rate (L/min): 0 L/min FiO2 (%): 21 %   Progressive dementia, failure to thrive, severe dehydration and cachexia, severe malnutrition -   Extremely poor  prognosis, likely has progressive failure to thrive due to advanced dementia, palliative care on board, she is DNR, per family if further significant decline comfort measures.  They want to try gentle medical treatment for a few days which I think is appropriate.  Continue IV fluids, PT-OT and speech eval. -Mains with very poor oral intake, continue with IV  fluids for now. -Continue with supplements for severe malnutrition, nutritionist consulted.  History of DVT.  On Eliquis.  Stop trending D-dimer as it will not change management much.  Anemia of chronic disease.  Monitor, no signs of acute bleeding.  Will place on PPI.  She is not a candidate for aggressive anemia work-up.  We will continue oral iron supplementation which she takes at home. Recent Hb has been around 8.5,  Results for MARIESSA, ELZEY (MRN [id number]) as of 12/20/2020 15:48  Ref. Range 04/26/2020 00:00 12/19/2020 11:40 12/20/2020 07:21  Hemoglobin Latest Ref Range: 12.0 - 15.0 g/dL 8.8 (A) 14.2 8.1 (L)  HCT Latest Ref Range: 36.0 - 46.0 % 26 (A) 42.1 23.6 (L)    Dyslipidemia.  On statin  Hypothyroidism.  On Synthroid.  TSH stable.  COPD.  No acute issues supportive care.  History of underlying diastolic CHF.  Chronic.  Compensated.  EF 65% on previous echocardiogram  CKD stage IV history of renal transplant.  Continue Imuran and cyclosporine.  She is chronically on Bactrim 3 times a week which is also continued.  With gentle hydration renal function has improved.  Creatinine appears to be close to 1.6.  Kindly continue oral steroids which she takes chronically upon discharge.  Hypertension.  Continue clonidine for now.  Hypokalemia -Repleted, recheck in a.m.    Recent Labs  Lab 12/19/20 1140 12/19/20 1419 12/19/20 1455 12/19/20 1940 12/20/20 0721 12/20/20 1657 12/21/20 0110  WBC 11.1*  --   --   --  9.3 5.9 5.8  HGB 14.2  --   --   --  8.1* 10.4* 10.5*  HCT 42.1  --   --   --  23.6* 31.2* 30.1*  PLT 307  --   --    --  250 179 180  CRP  --   --   --  8.0* 12.0*  --  7.9*  BNP  --   --   --   --  568.7*  --  723.1*  DDIMER  --   --   --  3.69* 7.15*  --   --   PROCALCITON  --   --   --  0.71 0.96  --  0.92  AST  --   --  27  --  18  --  17  ALT  --   --  8  --  9  --  9  ALKPHOS  --   --  74  --  61  --  49  BILITOT  --   --  1.5*  --  0.8  --  0.5  ALBUMIN  --   --  3.2*  --  2.7*  --  2.2*  SARSCOV2NAA  --  POSITIVE*  --   --   --   --   --    Lab Results  Component Value Date   TSH 0.587 12/19/2020         Condition - Extremely Guarded  Family Communication  :  None present.  Code Status : DNR  Consults  : Palliative care  PUD Prophylaxis : None   Procedures  :            Disposition Plan  :    Status is: Observation  Dispo: The patient is from: Home              Anticipated d/c is to: Home  Patient currently is not medically stable to d/c.   Difficult to place patient No   DVT Prophylaxis  :  Eliquis  Lab Results  Component Value Date   PLT 180 12/21/2020    Diet :  Diet Order            DIET - DYS 1 Room service appropriate? No; Fluid consistency: Nectar Thick  Diet effective now                  Inpatient Medications  Scheduled Meds: . apixaban  2.5 mg Oral BID  . azaTHIOprine  50 mg Oral BID  . chlorhexidine  15 mL Mouth Rinse BID  . cloNIDine  0.1 mg Oral BID  . cycloSPORINE modified  100 mg Oral BID  . docusate sodium  100 mg Oral BID  . feeding supplement  237 mL Oral TID BM  . ferrous sulfate  325 mg Oral Q breakfast  . FLUoxetine  20 mg Oral Daily  . fluticasone furoate-vilanterol  1 puff Inhalation Daily  . hydrocortisone sod succinate (SOLU-CORTEF) inj  50 mg Intravenous Q12H  . levothyroxine  75 mcg Oral Q0600  . mouth rinse  15 mL Mouth Rinse q12n4p  . mirabegron ER  50 mg Oral Daily  . multivitamin with minerals  1 tablet Oral Daily  . pantoprazole  40 mg Oral BID  . potassium chloride  30 mEq Oral Q4H  . sodium  chloride flush  3 mL Intravenous Q12H  . sulfamethoxazole-trimethoprim  1 tablet Oral Q M,W,F   Continuous Infusions:  PRN Meds:.acetaminophen **OR** acetaminophen, albuterol, bisacodyl, hydrALAZINE, [DISCONTINUED] ondansetron **OR** ondansetron (ZOFRAN) IV, polyethylene glycol, Resource ThickenUp Clear  Antibiotics  :    Anti-infectives (From admission, onward)   Start     Dose/Rate Route Frequency Ordered Stop   12/21/20 1000  sulfamethoxazole-trimethoprim (BACTRIM) 400-80 MG per tablet 1 tablet        1 tablet Oral Every M-W-F 12/19/20 1627     12/20/20 1000  remdesivir 100 mg in sodium chloride 0.9 % 100 mL IVPB       "Followed by" Linked Group Details   100 mg 200 mL/hr over 30 Minutes Intravenous Daily 12/19/20 1801 12/21/20 0917   12/19/20 1815  remdesivir 200 mg in sodium chloride 0.9% 250 mL IVPB       "Followed by" Linked Group Details   200 mg 580 mL/hr over 30 Minutes Intravenous Once 12/19/20 1801 12/20/20 0120       Nika Yazzie M.D on 12/21/2020 at 2:11 PM  To page go to [email or web address]   [facility] -  Office  [phone]   See all Orders from today for further details    Objective:   Vitals:   12/20/20 1228 12/20/20 1659 12/20/20 2000 12/21/20 0530  BP: 130/66 140/62 (!) 158/69 139/66  Pulse:   62 60  Resp:  '19 20 20  '$ Temp:  97.6 F (36.4 C) 97.8 F (36.6 C) 97.9 F (36.6 C)  TempSrc:  Oral Axillary   SpO2:  94%  100%  Weight:      Height:        Wt Readings from Last 3 Encounters:  12/19/20 43.9 kg  04/27/20 51.3 kg  04/15/20 51.3 kg     Intake/Output Summary (Last 24 hours) at 12/21/2020 1411 Last data filed at 12/21/2020 1307 Gross per 24 hour  Intake 1374.58 ml  Output --  Net 1374.58 ml     Physical Exam  Frail, elderly  cachectic female, laying in bed in no apparent distress, confused but pleasant  Symmetrical Chest wall movement, Good air movement bilaterally, CTAB RRR,No Gallops,Rubs or new Murmurs, No Parasternal  Heave +ve B.Sounds, Abd Soft, No tenderness, No rebound - guarding or rigidity. No Cyanosis, Clubbing or edema, No new Rash or bruise     Data Review:    CBC Recent Labs  Lab 12/19/20 1140 12/20/20 0721 12/20/20 1657 12/21/20 0110  WBC 11.1* 9.3 5.9 5.8  HGB 14.2 8.1* 10.4* 10.5*  HCT 42.1 23.6* 31.2* 30.1*  PLT 307 250 179 180  MCV 96.1 98.3 95.1 93.5  MCH 32.4 33.8 31.7 32.6  MCHC 33.7 34.3 33.3 34.9  RDW 13.4 13.3 13.3 13.2  LYMPHSABS  --   --   --  0.7  MONOABS  --   --   --  0.5  EOSABS  --   --   --  0.0  BASOSABS  --   --   --  0.0    Recent Labs  Lab 12/19/20 1140 12/19/20 1455 12/19/20 1638 12/19/20 1940 12/20/20 0721 12/21/20 0110  NA 143  --   --   --  143 141  K 3.7  --   --   --  3.7 2.8*  CL 111  --   --   --  114* 115*  CO2 16*  --   --   --  19* 17*  GLUCOSE 180*  --   --   --  136* 154*  BUN 51*  --   --   --  46* 50*  CREATININE 2.16*  --   --   --  1.87* 1.62*  CALCIUM 10.3  --   --   --  9.5 9.1  AST  --  27  --   --  18 17  ALT  --  8  --   --  9 9  ALKPHOS  --  74  --   --  61 49  BILITOT  --  1.5*  --   --  0.8 0.5  ALBUMIN  --  3.2*  --   --  2.7* 2.2*  MG  --   --   --   --  2.1 2.0  CRP  --   --   --  8.0* 12.0* 7.9*  DDIMER  --   --   --  3.69* 7.15*  --   PROCALCITON  --   --   --  0.71 0.96 0.92  TSH  --   --  0.587  --   --   --   BNP  --   --   --   --  568.7* 723.1*    ------------------------------------------------------------------------------------------------------------------ No results for input(s): CHOL, HDL, LDLCALC, TRIG, CHOLHDL, LDLDIRECT in the last 72 hours.  Lab Results  Component Value Date   HGBA1C 5.1 07/01/2018   ------------------------------------------------------------------------------------------------------------------ Recent Labs    12/19/20 1638  TSH 0.587    Cardiac Enzymes No results for input(s): CKMB, TROPONINI, MYOGLOBIN in the last 168 hours.  Invalid input(s):  CK ------------------------------------------------------------------------------------------------------------------    Component Value Date/Time   BNP 723.1 (H) 12/21/2020 0110    Micro Results Recent Results (from the past 240 hour(s))  Resp Panel by RT-PCR (Flu A&B, Covid) Nasopharyngeal Swab     Status: Abnormal   Collection Time: 12/19/20  2:19 PM   Specimen: Nasopharyngeal Swab; Nasopharyngeal(NP) swabs in vial transport medium  Result Value Ref Range Status  SARS Coronavirus 2 by RT PCR POSITIVE (A) NEGATIVE Final    Comment: RESULT CALLED TO, READ BACK BY AND VERIFIED WITH: RN Roni Bread [phone].  (NOTE) SARS-CoV-2 target nucleic acids are DETECTED.  The SARS-CoV-2 RNA is generally detectable in upper respiratory specimens during the acute phase of infection. Positive results are indicative of the presence of the identified virus, but do not rule out bacterial infection or co-infection with other pathogens not detected by the test. Clinical correlation with patient history and other diagnostic information is necessary to determine patient infection status. The expected result is Negative.  Fact Sheet for Patients: EntrepreneurPulse.com.au  Fact Sheet for Healthcare Providers: IncredibleEmployment.be  This test is not yet approved or cleared by the [place] FDA and  has been authorized for detection and/or diagnosis of SARS-CoV-2 by FDA under an Emergency Use Authorization (EUA).  This EUA will remain in effect (meaning this test can be use d) for the duration of  the COVID-19 declaration under Section 564(b)(1) of the Act, 21 U.S.C. section 360bbb-3(b)(1), unless the authorization is terminated or revoked sooner.     Influenza A by PCR NEGATIVE NEGATIVE Final   Influenza B by PCR NEGATIVE NEGATIVE Final    Comment: (NOTE) The Xpert Xpress SARS-CoV-2/FLU/RSV plus assay is intended as an aid in the diagnosis of  influenza from Nasopharyngeal swab specimens and should not be used as a sole basis for treatment. Nasal washings and aspirates are unacceptable for Xpert Xpress SARS-CoV-2/FLU/RSV testing.  Fact Sheet for Patients: EntrepreneurPulse.com.au  Fact Sheet for Healthcare Providers: IncredibleEmployment.be  This test is not yet approved or cleared by the [place] FDA and has been authorized for detection and/or diagnosis of SARS-CoV-2 by FDA under an Emergency Use Authorization (EUA). This EUA will remain in effect (meaning this test can be used) for the duration of the COVID-19 declaration under Section 564(b)(1) of the Act, 21 U.S.C. section 360bbb-3(b)(1), unless the authorization is terminated or revoked.  Performed at [facility], [place] [address], [place], [place] [address]     Radiology Reports DG Chest Portable 1 View  Result Date: 12/19/2020 CLINICAL DATA:  Altered mental status EXAM: PORTABLE CHEST 1 VIEW COMPARISON:  04/11/2020 FINDINGS: Hyperinflation. No new consolidation or edema. Left lung calcified granuloma is again identified. No pleural effusion. Stable cardiomediastinal contours with normal heart size. IMPRESSION: No acute process in the chest. Electronically Signed   By: Macy Mis M.D.   On: 12/19/2020 14:52

## 2020-12-21 NOTE — Progress Notes (Signed)
Notified MD patient needs orders to continue telemetry monitoring. Also informed of K+ level of 2.8 with todays labs.

## 2020-12-21 NOTE — Progress Notes (Signed)
Appropriate Use Committee Chart Review  Chart reviewed by the physician advisor with input from [facility] and the attending MD as needed for review of the appropriateness for SNF referral.  TOC notes, PT/OT/ST notes, nursing notes and physician notes reviewed for medical necessity to determine if the patient's needs are appropriate for short-term rehab to return to a prior level of function versus the likely need for custodial care.  At this time, the patient does not meet Medicare criteria for SNF placement. She has been non-ambulatory for over a year, and has been bed-bound for 3 weeks. ST rehab is unlikely to be of significant benefit.    Recommendations: The patient is not SNF appropriate for short-term rehab/skilled nursing interventions. Recommend home with [facility] services and/or long-term care.   A consult to the Transitions of Care Team has been made to discuss alternative options and discharge planning with the patient/family.  Risk of re-admission: 27%.   Jacquelynn Cree, MD Chief Physician Advisor  12/21/2020 4:04 PM

## 2020-12-21 NOTE — TOC Initial Note (Signed)
Transition of Care [facility]) - Initial/Assessment Note    Patient Details  Name: Patricia [name] MRN: [id number] Date of Birth: 30-May-1932  [facility] CM/SW Contact:    Carles Collet, RN Phone Number: 12/21/2020, 4:52 PM  Clinical Narrative:               Damaris Schooner w patient's spouse over the phone to discuss potential DC plan. Patient admitted w FTT, extreme weakness, very poor to almost no oral intake, recently bed bound at home. Patient will require 24 hour supervision and support if discharged to home.   Discussed home hospice services with spouse, and briefly philosophy of hospice services. He had many questions which demonstrated a lack of understanding of patient's current physical condition, disease trajectory or how it will influence life expectancy. He needs further support from palliative and medical team to address goals of care.       Expected Discharge Plan:  (TBD) Barriers to Discharge:  (covid, hospice care, 24 hour support at home)   Patient Goals and CMS Choice     Choice offered to / list presented to : Spouse  Expected Discharge Plan and Services Expected Discharge Plan:  (TBD) In-house Referral: Clinical Social Work Discharge Planning Services: CM Consult   Living arrangements for the past 2 months: Single Family Home                                      Prior Living Arrangements/Services Living arrangements for the past 2 months: Single Family Home Lives with:: Spouse                   Activities of Daily Living Home Assistive Devices/Equipment: Wheelchair ADL Screening (condition at time of admission) Patient's cognitive ability adequate to safely complete daily activities?: No Is the patient deaf or have difficulty hearing?: No Does the patient have difficulty seeing, even when wearing glasses/contacts?: No Does the patient have difficulty concentrating, remembering, or making decisions?: Yes Patient able to express need for  assistance with ADLs?: No Does the patient have difficulty dressing or bathing?: Yes Independently performs ADLs?: No Communication: Dependent Is this a change from baseline?: Pre-admission baseline Dressing (OT): Dependent Is this a change from baseline?: Pre-admission baseline Grooming: Dependent Is this a change from baseline?: Pre-admission baseline Feeding: Dependent Is this a change from baseline?: Pre-admission baseline Bathing: Dependent Is this a change from baseline?: Pre-admission baseline Toileting: Dependent Is this a change from baseline?: Pre-admission baseline In/Out Bed: Dependent Is this a change from baseline?: Pre-admission baseline Walks in Home: Dependent Is this a change from baseline?: Pre-admission baseline Does the patient have difficulty walking or climbing stairs?: Yes Weakness of Legs: Both Weakness of Arms/Hands: Both  Permission Sought/Granted                  Emotional Assessment              Admission diagnosis:  Dehydration [E86.0] Failure to thrive in adult [R62.7] AKI (acute kidney injury) ([place]) [N17.9] Patient Active Problem List   Diagnosis Date Noted  . Protein-calorie malnutrition, severe 12/21/2020  . Dehydration   . Failure to thrive in adult   . Palliative care by specialist   . Goals of care, counseling/discussion   . Pain in both upper extremities 04/11/2020  . History of anemia due to CKD 12/31/2019  . Anemia in chronic kidney disease (CKD) 12/31/2019  .  Generalized weakness 12/30/2019  . Cholecystitis with cholelithiasis 07/01/2018  . Postoperative anemia due to acute blood loss 02/15/2016  . Hyperlipidemia 02/15/2016  . Displaced fracture of right femoral neck ([place]) 02/10/2016  . Chronic diastolic CHF (congestive heart failure) ([place]) 02/08/2016  . Closed right hip fracture ([place]) 02/08/2016  . Hip fracture ([place]) 02/08/2016  . Acute diastolic CHF (congestive heart failure) ([place]) 05/27/2015  . Urinary  incontinence 05/27/2015  . Escherichia coli urinary tract infection   . Other emphysema ([place])   . Hypokalemia   . Hypomagnesemia   . Sepsis secondary to UTI ([place]) 05/22/2015  . DVT, lower extremity, proximal ([place]) 03/02/2014  . Chronic kidney disease, stage 4, severely decreased GFR (H[facility]) 03/02/2014  . Essential hypertension 03/02/2014  . DVT (deep venous thrombosis) ([place]) 03/01/2014  . Acute renal failure superimposed on stage 3 chronic kidney disease ([place]) 03/01/2014  . Gastroenteritis 11/22/2013  . Nausea & vomiting 11/22/2013  . Hypercalcemia 11/22/2013  . Infection due to ESBL-producing Escherichia coli 10/07/2013  . Septicemia due to E. coli ([place]) 10/07/2013  . Urinary tract infection 10/05/2013  . Sepsis ([place]) 10/05/2013  . History of renal transplant   . Thyroid disease   . Multiple allergies   . Abnormal gait   . Reflux   . Diverticulitis   . Hypothyroidism   . Depression   . Anxiety   . COPD (chronic obstructive pulmonary disease) ([place])   . Dizziness   . Diabetes type 2, controlled ([place])    PCP:  [name], MD Pharmacy:   [facility] - [place], [place] - [address] [place] [address] Phone: [phone] Fax: [phone]     Social Determinants of Health (SDOH) Interventions    Readmission Risk Interventions No flowsheet data found.

## 2020-12-21 NOTE — Progress Notes (Signed)
Daily Progress Note   Patient Name: Patricia [name]       Date: 12/21/2020 DOB: 1931-11-25  Age: 87 y.o. MRN#: [id number] Attending Physician: Albertine Patricia, MD Primary Care Physician: [name], MD Admit Date: 12/19/2020  Reason for Consultation/Follow-up: continued GOC disucssion  Subjective: Patient is alert, oriented to person and place. She tells me she is doing "good". She is able to tell me she is in the hospital for "an illness that is easy to catch". I provided education that was positive for COVID, but she was mainly in the hospital for not eating or drinking over the past 3 weeks. She tells me she is eating and drinking now. She is asking to go home.   RN enters room during my visit, states patient is eating 25% or less of her meal trays, will drink ensure and eat ice cream.   I spoke with husband [name] by phone. I let him know that patient was improved from when I saw her in the ER, that she was alert and eating/drinking some. He expresses appreciation for the update, stating he has not been updated on how she is doing. I cautioned [name] that even though she is improved (from admission), she is not eating/drinking well. I provided education that she is very malnourished I expressed concern that her clinical presentation is consistent with someone with advanced dementia. [name] states he is "not surprised to hear that". I provided education regarding the diagnosis of dementia and its natural trajectory, emphasizing that it is a progressive illness that gets worse over time. This includes decreased ability to communicate, ambulate, swallow, and maintain continence. Discussed that failure to thrive is seen with advanced dementia, and this is a terminal condition. He is surprised to  hear this and seems not to be fully processing the information.   Inquired if the goal of care was for patient to return home. [name] expresses concern over his ability to care for her at home. Discussed that PT has seen her in the hospital and is recommending [place] with continued PT. Discussed that she is very weak and may or may not benefit from PT. [name] inquires whether she can go to [facility] for rehab. He states she has been there several times and did well; he wants her to receive PT so she  can get strong enough to come home. I let [name] know I would communicate this request to the care team.    Length of Stay: 1  Current Medications: Scheduled Meds:  . apixaban  2.5 mg Oral BID  . azaTHIOprine  50 mg Oral BID  . chlorhexidine  15 mL Mouth Rinse BID  . cloNIDine  0.1 mg Oral BID  . cycloSPORINE modified  100 mg Oral BID  . docusate sodium  100 mg Oral BID  . feeding supplement  237 mL Oral TID BM  . ferrous sulfate  325 mg Oral Q breakfast  . FLUoxetine  20 mg Oral Daily  . fluticasone furoate-vilanterol  1 puff Inhalation Daily  . hydrocortisone sod succinate (SOLU-CORTEF) inj  50 mg Intravenous Q12H  . levothyroxine  75 mcg Oral Q0600  . mouth rinse  15 mL Mouth Rinse q12n4p  . mirabegron ER  50 mg Oral Daily  . multivitamin with minerals  1 tablet Oral Daily  . pantoprazole  40 mg Oral BID  . potassium chloride  30 mEq Oral Q4H  . sodium chloride flush  3 mL Intravenous Q12H  . sulfamethoxazole-trimethoprim  1 tablet Oral Q M,W,F      PRN Meds: acetaminophen **OR** acetaminophen, albuterol, bisacodyl, hydrALAZINE, [DISCONTINUED] ondansetron **OR** ondansetron (ZOFRAN) IV, polyethylene glycol, Resource ThickenUp Clear  Physical Exam Vitals reviewed.  Constitutional:      General: She is not in acute distress.    Comments: Frail  Pulmonary:     Effort: Pulmonary effort is normal.  Neurological:     Mental Status: She is alert.     Motor: Weakness present.      Comments: Oriented to person, place  Psychiatric:        Cognition and Memory: Cognition is impaired.             Vital Signs: BP (!) 131/53   Pulse (!) 54   Temp 97.9 F (36.6 C)   Resp 16   Ht '5\' 1"'$  (1.549 m)   Wt 43.9 kg   SpO2 100%   BMI 18.29 kg/m  SpO2: SpO2: 100 % O2 Device: O2 Device: Room Air O2 Flow Rate: O2 Flow Rate (L/min): 0 L/min  Intake/output summary:   Intake/Output Summary (Last 24 hours) at 12/21/2020 1553 Last data filed at 12/21/2020 1500 Gross per 24 hour  Intake 1719.86 ml  Output --  Net 1719.86 ml   LBM: Last BM Date:  (PTA) Baseline Weight: Weight: 43.9 kg Most recent weight: Weight: 43.9 kg       Palliative Assessment/Data: PPS 30%      Palliative Care Assessment & Plan    HPI/Patient Profile: 87 y.o. 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 In the last 3 weeks, she has refused to get out of bed and is not essentially non-verbal. Prior to that she was cognitively intact and able to mobilize with a wheelchair. She has also been refusing all food and drinks very little. He feels like he has been slowly watching her die.  ED Course: Seems dehydrated, started on IV fluids. Albumin 3.2. Found to be positive for COVID; this is believed to be incidental. Admitted to [facility] for observation and further evaluation of failure to thrive. Per H&P, "her current presentation appears to be consistent with progressive cognitive decline associated  with dementia, quite possibly terminal dementia".    Assessment: - severe dehydration - severe malnutrition, cachexia, failure to thrive - likely advanced dementia  - incidental COVID infection - underlying CHF - CKD stage 4 s/p renal transplant (2009)  Recommendations/Plan: DNR/DNI as previously  documented Continue current medical care Patient has improved from admission, but likely has underlying advanced dementia Husband needs additional education regarding diagnosis and prognosis of dementia Husband has expressed concern about his ability to care for patient at home Husband is requesting [facility] for rehab; I have communicated this to the team PMT will continue to follow  Goals of Care and Additional Recommendations: Limitations on Scope of Treatment: No Artificial Feeding  Code Status: DNR/DNI  Prognosis:  < 6 months  Discharge Planning: To Be Determined  Care plan was discussed with Dr. Waldron Labs and case management  Thank you for allowing the Palliative Medicine Team to assist in the care of this patient.   Total Time 35 minutes Prolonged Time Billed  no       Greater than 50%  of this time was spent counseling and coordinating care related to the above assessment and plan.  Lavena Bullion, NP  Please contact Palliative Medicine Team phone at [phone] for questions and concerns.

## 2020-12-22 DIAGNOSIS — I10 Essential (primary) hypertension: Secondary | ICD-10-CM | POA: Diagnosis not present

## 2020-12-22 DIAGNOSIS — K5901 Slow transit constipation: Secondary | ICD-10-CM | POA: Diagnosis not present

## 2020-12-22 DIAGNOSIS — R1312 Dysphagia, oropharyngeal phase: Secondary | ICD-10-CM | POA: Diagnosis not present

## 2020-12-22 DIAGNOSIS — J42 Unspecified chronic bronchitis: Secondary | ICD-10-CM

## 2020-12-22 DIAGNOSIS — M6281 Muscle weakness (generalized): Secondary | ICD-10-CM | POA: Diagnosis not present

## 2020-12-22 DIAGNOSIS — U071 COVID-19: Secondary | ICD-10-CM | POA: Diagnosis not present

## 2020-12-22 DIAGNOSIS — F339 Major depressive disorder, recurrent, unspecified: Secondary | ICD-10-CM | POA: Diagnosis not present

## 2020-12-22 DIAGNOSIS — E1122 Type 2 diabetes mellitus with diabetic chronic kidney disease: Secondary | ICD-10-CM | POA: Diagnosis not present

## 2020-12-22 DIAGNOSIS — H35033 Hypertensive retinopathy, bilateral: Secondary | ICD-10-CM | POA: Diagnosis not present

## 2020-12-22 DIAGNOSIS — Z20818 Contact with and (suspected) exposure to other bacterial communicable diseases: Secondary | ICD-10-CM | POA: Diagnosis not present

## 2020-12-22 DIAGNOSIS — J449 Chronic obstructive pulmonary disease, unspecified: Secondary | ICD-10-CM | POA: Diagnosis not present

## 2020-12-22 DIAGNOSIS — R2681 Unsteadiness on feet: Secondary | ICD-10-CM | POA: Diagnosis not present

## 2020-12-22 DIAGNOSIS — E782 Mixed hyperlipidemia: Secondary | ICD-10-CM | POA: Diagnosis not present

## 2020-12-22 DIAGNOSIS — R634 Abnormal weight loss: Secondary | ICD-10-CM | POA: Diagnosis not present

## 2020-12-22 DIAGNOSIS — N184 Chronic kidney disease, stage 4 (severe): Secondary | ICD-10-CM | POA: Diagnosis not present

## 2020-12-22 DIAGNOSIS — M2550 Pain in unspecified joint: Secondary | ICD-10-CM | POA: Diagnosis not present

## 2020-12-22 DIAGNOSIS — N179 Acute kidney failure, unspecified: Secondary | ICD-10-CM | POA: Diagnosis not present

## 2020-12-22 DIAGNOSIS — E7800 Pure hypercholesterolemia, unspecified: Secondary | ICD-10-CM | POA: Diagnosis not present

## 2020-12-22 DIAGNOSIS — Z7401 Bed confinement status: Secondary | ICD-10-CM | POA: Diagnosis not present

## 2020-12-22 DIAGNOSIS — F32A Depression, unspecified: Secondary | ICD-10-CM | POA: Diagnosis not present

## 2020-12-22 DIAGNOSIS — F324 Major depressive disorder, single episode, in partial remission: Secondary | ICD-10-CM | POA: Diagnosis not present

## 2020-12-22 DIAGNOSIS — D631 Anemia in chronic kidney disease: Secondary | ICD-10-CM | POA: Diagnosis not present

## 2020-12-22 DIAGNOSIS — I825Y2 Chronic embolism and thrombosis of unspecified deep veins of left proximal lower extremity: Secondary | ICD-10-CM

## 2020-12-22 DIAGNOSIS — Z940 Kidney transplant status: Secondary | ICD-10-CM

## 2020-12-22 DIAGNOSIS — E43 Unspecified severe protein-calorie malnutrition: Secondary | ICD-10-CM | POA: Diagnosis not present

## 2020-12-22 DIAGNOSIS — K219 Gastro-esophageal reflux disease without esophagitis: Secondary | ICD-10-CM | POA: Diagnosis not present

## 2020-12-22 DIAGNOSIS — E034 Atrophy of thyroid (acquired): Secondary | ICD-10-CM | POA: Diagnosis not present

## 2020-12-22 DIAGNOSIS — R531 Weakness: Secondary | ICD-10-CM | POA: Diagnosis not present

## 2020-12-22 DIAGNOSIS — M810 Age-related osteoporosis without current pathological fracture: Secondary | ICD-10-CM | POA: Diagnosis not present

## 2020-12-22 DIAGNOSIS — R5381 Other malaise: Secondary | ICD-10-CM | POA: Diagnosis not present

## 2020-12-22 DIAGNOSIS — E039 Hypothyroidism, unspecified: Secondary | ICD-10-CM | POA: Diagnosis not present

## 2020-12-22 DIAGNOSIS — F0390 Unspecified dementia without behavioral disturbance: Secondary | ICD-10-CM | POA: Diagnosis not present

## 2020-12-22 DIAGNOSIS — Z515 Encounter for palliative care: Secondary | ICD-10-CM | POA: Diagnosis not present

## 2020-12-22 DIAGNOSIS — I129 Hypertensive chronic kidney disease with stage 1 through stage 4 chronic kidney disease, or unspecified chronic kidney disease: Secondary | ICD-10-CM | POA: Diagnosis not present

## 2020-12-22 DIAGNOSIS — R627 Adult failure to thrive: Secondary | ICD-10-CM | POA: Diagnosis not present

## 2020-12-22 LAB — CBC WITH DIFFERENTIAL/PLATELET
Abs Immature Granulocytes: 0.04 10*3/uL (ref 0.00–0.07)
Basophils Absolute: 0 10*3/uL (ref 0.0–0.1)
Basophils Relative: 0 %
Eosinophils Absolute: 0 10*3/uL (ref 0.0–0.5)
Eosinophils Relative: 0 %
HCT: 29.5 % — ABNORMAL LOW (ref 36.0–46.0)
Hemoglobin: 9.8 g/dL — ABNORMAL LOW (ref 12.0–15.0)
Immature Granulocytes: 1 %
Lymphocytes Relative: 16 %
Lymphs Abs: 0.8 10*3/uL (ref 0.7–4.0)
MCH: 32.2 pg (ref 26.0–34.0)
MCHC: 33.2 g/dL (ref 30.0–36.0)
MCV: 97 fL (ref 80.0–100.0)
Monocytes Absolute: 0.5 10*3/uL (ref 0.1–1.0)
Monocytes Relative: 10 %
Neutro Abs: 3.8 10*3/uL (ref 1.7–7.7)
Neutrophils Relative %: 73 %
Platelets: 182 10*3/uL (ref 150–400)
RBC: 3.04 MIL/uL — ABNORMAL LOW (ref 3.87–5.11)
RDW: 13.5 % (ref 11.5–15.5)
WBC: 5.1 10*3/uL (ref 4.0–10.5)
nRBC: 0 % (ref 0.0–0.2)

## 2020-12-22 LAB — COMPREHENSIVE METABOLIC PANEL
ALT: 8 U/L (ref 0–44)
AST: 17 U/L (ref 15–41)
Albumin: 2.1 g/dL — ABNORMAL LOW (ref 3.5–5.0)
Alkaline Phosphatase: 56 U/L (ref 38–126)
Anion gap: 9 (ref 5–15)
BUN: 54 mg/dL — ABNORMAL HIGH (ref 8–23)
CO2: 19 mmol/L — ABNORMAL LOW (ref 22–32)
Calcium: 9.3 mg/dL (ref 8.9–10.3)
Chloride: 113 mmol/L — ABNORMAL HIGH (ref 98–111)
Creatinine, Ser: 1.92 mg/dL — ABNORMAL HIGH (ref 0.44–1.00)
GFR, Estimated: 25 mL/min — ABNORMAL LOW (ref >60)
Glucose, Bld: 255 mg/dL — ABNORMAL HIGH (ref 70–99)
Potassium: 3.3 mmol/L — ABNORMAL LOW (ref 3.5–5.1)
Sodium: 141 mmol/L (ref 135–145)
Total Bilirubin: 0.5 mg/dL (ref 0.3–1.2)
Total Protein: 5.4 g/dL — ABNORMAL LOW (ref 6.5–8.1)

## 2020-12-22 LAB — PHOSPHORUS: Phosphorus: 2.1 mg/dL — ABNORMAL LOW (ref 2.5–4.6)

## 2020-12-22 LAB — BRAIN NATRIURETIC PEPTIDE: B Natriuretic Peptide: 800.3 pg/mL — ABNORMAL HIGH (ref 0.0–100.0)

## 2020-12-22 LAB — PROCALCITONIN: Procalcitonin: 0.69 ng/mL

## 2020-12-22 LAB — MAGNESIUM: Magnesium: 2.1 mg/dL (ref 1.7–2.4)

## 2020-12-22 LAB — GLUCOSE, CAPILLARY
Glucose-Capillary: 207 mg/dL — ABNORMAL HIGH (ref 70–99)
Glucose-Capillary: 280 mg/dL — ABNORMAL HIGH (ref 70–99)

## 2020-12-22 LAB — C-REACTIVE PROTEIN: CRP: 4.6 mg/dL — ABNORMAL HIGH (ref <1.0)

## 2020-12-22 MED ORDER — SULFAMETHOXAZOLE-TRIMETHOPRIM 400-80 MG PO TABS: 1.0000 | ORAL_TABLET | ORAL | 0 refills | Status: DC

## 2020-12-22 MED ORDER — K PHOS MONO-SOD PHOS DI & MONO 155-852-130 MG PO TABS: 500.0000 mg | ORAL_TABLET | Freq: Every day | ORAL | 0 refills | Status: DC

## 2020-12-22 MED ORDER — K PHOS MONO-SOD PHOS DI & MONO 155-852-130 MG PO TABS
500.0000 mg | ORAL_TABLET | Freq: Every day | ORAL | Status: DC
  Administered 2020-12-22: 500 mg via ORAL
  Filled 2020-12-22: qty 2

## 2020-12-22 MED ORDER — APIXABAN 2.5 MG PO TABS: 2.5000 mg | ORAL_TABLET | Freq: Two times a day (BID) | ORAL | 0 refills | Status: DC

## 2020-12-22 NOTE — Progress Notes (Signed)
Patient discharged from facility at this time via transport to [facility]. All patient belongings in disposition of the patient. IV site removed, catheter intact, pressure dressing applied. Discharge paperwork with transport team.

## 2020-12-22 NOTE — Progress Notes (Signed)
  Speech Language Pathology Treatment: Dysphagia  Patient Details Name: Patricia [name] MRN: [id number] DOB: [birth year] Today's Date: 12/22/2020 Time: [id number] SLP Time Calculation (min) (ACUTE ONLY): 17 min  Assessment / Plan / Recommendation Clinical Impression  Pt was seen for dysphagia treatment. She was alert and cooperative during the session without c/o pain. Pt's overall swallow function appears improved compared to that noted during the initial evaluation on 3/8. Bolus awareness was adequate and she did not require cues for bolus manipulation. Mastication was prolonged with regular texture solids, likely due to limited dentition, but functional for dysphagia 2 solids. No significant oral residue was noted She tolerated 8oz of thin liquids without coughing, but throat clearing was noted twice with multiple consecutive swallows. Pt's diet will be advanced to dysphagia 2 solids with thin liquids. SLP will follow to assess diet tolerance.    HPI HPI: Pt is an 87 y.o. female admitted 12/19/20 due to failure to thrive. Also found to have COVID. PMH: kidney transplant (2009), pre-DM, hypothyroidism, HTN, CAD, mild COPD, GERD. Palliative care consulted, she is DNR, per family if further significant decline they will transition to comfort measures.CXR 3/7 was negative for acute changes.      SLP Plan  Continue with current plan of care       Recommendations  Diet recommendations: Dysphagia 2 (fine chop);Nectar-thick liquid Liquids provided via: Cup;Straw Medication Administration: Crushed with puree Supervision: Staff to assist with self feeding Compensations: Minimize environmental distractions;Slow rate;Small sips/bites Postural Changes and/or Swallow Maneuvers: Seated upright 90 degrees                Oral Care Recommendations: Oral care BID Follow up Recommendations: Skilled Nursing facility;24 hour supervision/assistance SLP Visit Diagnosis: Dysphagia, unspecified  (R13.10) Plan: Continue with current plan of care       Cicilia Clinger I. Hardin Negus, [place], [place] Office number [phone] Pager [place] 12/22/2020, 10:09 AM

## 2020-12-22 NOTE — Plan of Care (Signed)

## 2020-12-22 NOTE — TOC Transition Note (Signed)
Transition of Care [facility]) - CM/SW Discharge Note   Patient Details  Name: Patricia [name] MRN: [id number] Date of Birth: 03/29/1932  [facility]) CM/SW Contact:  Benard Halsted, LCSW Phone Number: 12/22/2020, 12:50 PM   Clinical Narrative:    Patient will DC to: [facility] Anticipated DC date: 12/22/20 Family notified: Spouse Transport by: Corey Harold   Per MD patient ready for DC to [facility]. RN to call report prior to discharge [phone] Room 309). RN, patient, patient's family, and facility notified of DC. Discharge Summary and FL2 sent to facility. DC packet on chart. Ambulance transport requested for patient.   CSW will sign off for now as social work intervention is no longer needed. Please consult [place] again if new needs arise.      Final next level of care: Skilled Nursing Facility Barriers to Discharge: Barriers Resolved   Patient Goals and CMS Choice Patient states their goals for this hospitalization and ongoing recovery are:: rehab CMS Medicare.gov Compare Post Acute Care list provided to:: Patient Represenative (must comment) Choice offered to / list presented to : Spouse  Discharge Placement   Existing PASRR number confirmed : 12/22/20          Patient chooses bed at: [facility] Patient to be transferred to facility by: [facility] Name of family member notified: Spouse Patient and family notified of of transfer: 12/22/20  Discharge Plan and Services In-house Referral: Clinical Social Work Discharge Planning Services: CM Consult Post Acute Care Choice: [place]                               Social Determinants of Health (SDOH) Interventions     Readmission Risk Interventions Readmission Risk Prevention Plan 12/22/2020  Transportation Screening Complete  Medication Review Press photographer) Referral to Pharmacy  PCP or Specialist appointment within 3-5 days of discharge Complete  HRI or Home Care Consult  Complete  SW Recovery Care/Counseling Consult Complete  Palliative Care Screening Complete  Skilled Nursing Facility Complete  Some recent data might be hidden

## 2020-12-22 NOTE — Discharge Instructions (Signed)
Follow with Primary MD [name], MD or SNF physician  Get CBC, CMP, checked  by Primary MD next visit.    Activity: As tolerated with Full fall precautions use walker/cane & assistance as needed   Disposition SNF    Diet: Dysphagia 2 with thin liquid, please encourage oral intake and offer supplements.  On your next visit with your primary care physician please Get Medicines reviewed and adjusted.   Please request your Prim.MD to go over all Hospital Tests and Procedure/Radiological results at the follow up, please get all Hospital records sent to your Prim MD by signing hospital release before you go home.   If you experience worsening of your admission symptoms, develop shortness of breath, life threatening emergency, suicidal or homicidal thoughts you must seek medical attention immediately by calling 911 or calling your MD immediately  if symptoms less severe.  You Must read complete instructions/literature along with all the possible adverse reactions/side effects for all the Medicines you take and that have been prescribed to you. Take any new Medicines after you have completely understood and accpet all the possible adverse reactions/side effects.   Do not drive, operating heavy machinery, perform activities at heights, swimming or participation in water activities or provide baby sitting services if your were admitted for syncope or siezures until you have seen by Primary MD or a Neurologist and advised to do so again.  Do not drive when taking Pain medications.    Do not take more than prescribed Pain, Sleep and Anxiety Medications  Special Instructions: If you have smoked or chewed Tobacco  in the last 2 yrs please stop smoking, stop any regular Alcohol  and or any Recreational drug use.  Wear Seat belts while driving.   Please note  You were cared for by a hospitalist during your hospital stay. If you have any questions about your discharge medications or the  care you received while you were in the hospital after you are discharged, you can call the unit and asked to speak with the hospitalist on call if the hospitalist that took care of you is not available. Once you are discharged, your primary care physician will handle any further medical issues. Please note that NO REFILLS for any discharge medications will be authorized once you are discharged, as it is imperative that you return to your primary care physician (or establish a relationship with a primary care physician if you do not have one) for your aftercare needs so that they can reassess your need for medications and monitor your lab values.

## 2020-12-22 NOTE — Progress Notes (Signed)
Report called to Seth Bake, Therapist, sports at [facility] facility. Will call back to facility with ptar transport time.

## 2020-12-22 NOTE — TOC Progression Note (Signed)
Transition of Care [facility]) - Progression Note    Patient Details  Name: Patricia [name] MRN: [id number] Date of Birth: 1932/09/03  Transition of Care [facility]) CM/SW [place], LCSW Phone Number: 12/22/2020, 12:41 PM  Clinical Narrative:    CSW spoke with patient's spouse. He is in agreement with patient going to [facility] (only COVID offer) to try rehab before coming back home. [facility] able to accept patient and insurance approval received: [id number], effective 12/22/20-12/26/20. CSW will arrange PTAR for transport.   Expected Discharge Plan: Skilled Nursing Facility Barriers to Discharge: Barriers Resolved  Expected Discharge Plan and Services Expected Discharge Plan: [place] In-house Referral: Clinical Social Work Discharge Planning Services: CM Consult Post Acute Care Choice: [place] arrangements for the past 2 months: Single Family Home Expected Discharge Date: 12/22/20                                     Social Determinants of Health (SDOH) Interventions    Readmission Risk Interventions Readmission Risk Prevention Plan 12/22/2020  Transportation Screening Complete  Medication Review Press photographer) Referral to Pharmacy  PCP or Specialist appointment within 3-5 days of discharge Complete  HRI or Home Care Consult Complete  SW Recovery Care/Counseling Consult Complete  Palliative Care Screening Complete  Skilled Nursing Facility Complete  Some recent data might be hidden

## 2020-12-22 NOTE — NC FL2 (Signed)
[place] MEDICAID FL2 LEVEL OF CARE SCREENING TOOL     IDENTIFICATION  Patient Name: Patricia [name] Birthdate: September 22, 1932 Sex: female Admission Date (Current Location): 12/19/2020  [facility] and [place] Number:  Herbalist and Address:  [facility] [place], [place] [address] [place], [place] [address]      Provider Number: [id number]  Attending Physician Name and Address:  Elgergawy, Silver Huguenin, MD  Relative Name and Phone Number:       Current Level of Care: Hospital Recommended Level of Care: [place] Prior Approval Number:    Date Approved/Denied:   PASRR Number: [id number]  Discharge Plan: SNF    Current Diagnoses: Patient Active Problem List   Diagnosis Date Noted  . Protein-calorie malnutrition, severe 12/21/2020  . Dehydration   . Failure to thrive in adult   . Palliative care by specialist   . Goals of care, counseling/discussion   . Pain in both upper extremities 04/11/2020  . History of anemia due to CKD 12/31/2019  . Anemia in chronic kidney disease (CKD) 12/31/2019  . Generalized weakness 12/30/2019  . Cholecystitis with cholelithiasis 07/01/2018  . Postoperative anemia due to acute blood loss 02/15/2016  . Hyperlipidemia 02/15/2016  . Displaced fracture of right femoral neck ([place]) 02/10/2016  . Chronic diastolic CHF (congestive heart failure) ([place]) 02/08/2016  . Closed right hip fracture ([place]) 02/08/2016  . Hip fracture ([place]) 02/08/2016  . Acute diastolic CHF (congestive heart failure) ([place]) 05/27/2015  . Urinary incontinence 05/27/2015  . Escherichia coli urinary tract infection   . Other emphysema ([place])   . Hypokalemia   . Hypomagnesemia   . Sepsis secondary to UTI ([place]) 05/22/2015  . DVT, lower extremity, proximal ([place]) 03/02/2014  . Chronic kidney disease, stage 4, severely decreased GFR (HCC) 03/02/2014  . Essential hypertension 03/02/2014  . DVT (deep venous thrombosis) ([place]) 03/01/2014  . Acute renal  failure superimposed on stage 3 chronic kidney disease ([place]) 03/01/2014  . Gastroenteritis 11/22/2013  . Nausea & vomiting 11/22/2013  . Hypercalcemia 11/22/2013  . Infection due to ESBL-producing Escherichia coli 10/07/2013  . Septicemia due to E. coli ([place]) 10/07/2013  . Urinary tract infection 10/05/2013  . Sepsis ([place]) 10/05/2013  . History of renal transplant   . Thyroid disease   . Multiple allergies   . Abnormal gait   . Reflux   . Diverticulitis   . Hypothyroidism   . Depression   . Anxiety   . COPD (chronic obstructive pulmonary disease) ([place])   . Dizziness   . Diabetes type 2, controlled ([place])     Orientation RESPIRATION BLADDER Height & Weight     Self,Place  Normal Incontinent,External catheter Weight: 96 lb 12.5 oz (43.9 kg) Height:  '5\' 1"'$  (154.9 cm)  BEHAVIORAL SYMPTOMS/MOOD NEUROLOGICAL BOWEL NUTRITION STATUS      Continent Diet (Please see DC Summary)  AMBULATORY STATUS COMMUNICATION OF NEEDS Skin   Extensive Assist Verbally Other (Comment) (Skin tear on chest)                       Personal Care Assistance Level of Assistance  Bathing,Feeding,Dressing Bathing Assistance: Maximum assistance Feeding assistance: Maximum assistance Dressing Assistance: Maximum assistance     Functional Limitations Info  Hearing   Hearing Info: Impaired      SPECIAL CARE FACTORS FREQUENCY  PT (By licensed PT),OT (By licensed OT)     PT Frequency: 5x/week OT Frequency: 5x/week  Contractures Contractures Info: Not present    Additional Factors Info  Code Status,Allergies,Isolation Precautions,Psychotropic Code Status Info: DNR Allergies Info: Macrolides And Ketolides, Aricept (Donepezil Hcl), Codeine, Morphine, Penicillin G, Penicillins, Statins, Streptomycin, Tetracycline, Tetracyclines & Related Psychotropic Info: Prozac; clonidine   Isolation Precautions Info: COVID+ 12/19/20     Current Medications (12/22/2020):  This is the current  hospital active medication list Current Facility-Administered Medications  Medication Dose Route Frequency Provider Last Rate Last Admin  . acetaminophen (TYLENOL) tablet 650 mg  650 mg Oral Q6H PRN Karmen Bongo, MD       Or  . acetaminophen (TYLENOL) suppository 650 mg  650 mg Rectal Q6H PRN Karmen Bongo, MD      . albuterol (VENTOLIN HFA) 108 (90 Base) MCG/ACT inhaler 2 puff  2 puff Inhalation Q6H PRN Karmen Bongo, MD      . apixaban Arne [place]) tablet 2.5 mg  2.5 mg Oral BID Karmen Bongo, MD   2.5 mg at 12/22/20 0907  . azaTHIOprine (IMURAN) tablet 50 mg  50 mg Oral BID Karmen Bongo, MD   50 mg at 12/22/20 0907  . bisacodyl (DULCOLAX) EC tablet 5 mg  5 mg Oral Daily PRN Karmen Bongo, MD      . chlorhexidine (PERIDEX) 0.12 % solution 15 mL  15 mL Mouth Rinse BID Thurnell Lose, MD   15 mL at 12/22/20 0906  . cloNIDine (CATAPRES) tablet 0.1 mg  0.1 mg Oral BID Karmen Bongo, MD   0.1 mg at 12/22/20 0907  . cycloSPORINE modified (NEORAL) capsule 100 mg  100 mg Oral BID Karmen Bongo, MD   100 mg at 12/22/20 0908  . docusate sodium (COLACE) capsule 100 mg  100 mg Oral BID Karmen Bongo, MD   100 mg at 12/22/20 0906  . feeding supplement (ENSURE ENLIVE / ENSURE PLUS) liquid 237 mL  237 mL Oral TID BM Thurnell Lose, MD   237 mL at 12/22/20 0909  . ferrous sulfate tablet 325 mg  325 mg Oral Q breakfast Thurnell Lose, MD   325 mg at 12/22/20 0907  . FLUoxetine (PROZAC) capsule 20 mg  20 mg Oral Daily Karmen Bongo, MD   20 mg at 12/22/20 0907  . fluticasone furoate-vilanterol (BREO ELLIPTA) 200-25 MCG/INH 1 puff  1 puff Inhalation Daily Karmen Bongo, MD   1 puff at 12/22/20 0908  . hydrALAZINE (APRESOLINE) injection 5 mg  5 mg Intravenous Q4H PRN Karmen Bongo, MD   5 mg at 12/20/20 0839  . hydrocortisone sodium succinate (SOLU-CORTEF) 100 MG injection 50 mg  50 mg Intravenous Q12H Thurnell Lose, MD   50 mg at 12/22/20 0907  . levothyroxine (SYNTHROID)  tablet 75 mcg  75 mcg Oral Q0600 Karmen Bongo, MD   75 mcg at 12/22/20 [id number]  . lip balm (CARMEX) ointment 1 application  1 application Topical PRN Elgergawy, Silver Huguenin, MD   1 application at [id number]  . MEDLINE mouth rinse  15 mL Mouth Rinse q12n4p Thurnell Lose, MD   15 mL at 12/20/20 1620  . mirabegron ER (MYRBETRIQ) tablet 50 mg  50 mg Oral Daily Karmen Bongo, MD   50 mg at 12/22/20 0907  . multivitamin with minerals tablet 1 tablet  1 tablet Oral Daily Thurnell Lose, MD   1 tablet at 12/22/20 0907  . ondansetron (ZOFRAN) injection 4 mg  4 mg Intravenous Q6H PRN Karmen Bongo, MD      . pantoprazole (PROTONIX) EC tablet 40 mg  40 mg Oral BID Thurnell Lose, MD   40 mg at 12/22/20 [id number]  . phosphorus (K PHOS NEUTRAL) tablet 500 mg  500 mg Oral Daily Elgergawy, Dawood S, MD      . polyethylene glycol (MIRALAX / GLYCOLAX) packet 17 g  17 g Oral Daily PRN Karmen Bongo, MD      . Resource ThickenUp Clear   Oral PRN Thurnell Lose, MD      . sodium chloride flush (NS) 0.9 % injection 3 mL  3 mL Intravenous Q12H Karmen Bongo, MD   3 mL at 12/22/20 0909  . sulfamethoxazole-trimethoprim (BACTRIM) 400-80 MG per tablet 1 tablet  1 tablet Oral Q M,W,F Karmen Bongo, MD   1 tablet at 12/21/20 0845     Discharge Medications: Please see discharge summary for a list of discharge medications.  Relevant Imaging Results:  Relevant Lab Results:   Additional Information SS#[id number]. Moderna COVID-19 Vaccine 11/28/2019  Benard Halsted, LCSW

## 2020-12-22 NOTE — Discharge Summary (Signed)
Patricia [name], is a 87 y.o. female  DOB 06/16/1932  MRN [id number].  Admission date:  12/19/2020  Admitting Physician  Thurnell Lose, MD  Discharge Date:  12/22/2020   Primary MD  [name], MD  Recommendations for primary care physician for things to follow:  -Please keep encouraging her to have oral intake, have SLP to follow to advance diet as tolerated as well, she is currently on dysphagia 2 thin liquids -Please consult palliative medicine at facility to keep addressing goals of care given the nature of her advanced progressive dementia, and significant failure to thrive and malnutrition.  CODE STATUS: DNR  Dysphagia 2 diet with thin liquid   Admission Diagnosis  Dehydration [E86.0] Failure to thrive in adult [R62.7] AKI (acute kidney injury) ([place]) [N17.9]   Discharge Diagnosis  Dehydration [E86.0] Failure to thrive in adult [R62.7] AKI (acute kidney injury) ([place]) [N17.9]    Principal Problem:   Failure to thrive in adult Active Problems:   History of renal transplant   Hypothyroidism   COPD (chronic obstructive pulmonary disease) (HCC)   Diabetes type 2, controlled (HCC)   Chronic kidney disease, stage 4, severely decreased GFR (HCC)   Essential hypertension   Goals of care, counseling/discussion   Protein-calorie malnutrition, severe      Past Medical History:  Diagnosis Date  . Abnormal gait   . Anxiety   . Arthritis   . Asthma   . COPD, mild ([place])   . Coronary artery disease    pt denies  . Depression   . Diverticulitis   . Dizziness   . DVT (deep venous thrombosis) ([place])   . GERD (gastroesophageal reflux disease)   . Hypertension   . Hypothyroidism   . Multiple allergies   . Pneumonia   . Pre-diabetes   . Reflux   . Renal disorder    had kidney transplant in 2009    Past Surgical History:  Procedure Laterality Date  . ANTERIOR APPROACH HEMI HIP  ARTHROPLASTY Right 02/10/2016   Procedure: TOTAL HIP ARTHROPLASTY ANTERIOR APPROACH;  Surgeon: Rod Can, MD;  Location: [facility];  Service: Orthopedics;  Laterality: Right;  . CHOLECYSTECTOMY N/A 07/01/2018   Procedure: LAPAROSCOPIC CHOLECYSTECTOMY WITH INTRAOPERATIVE CHOLANGIOGRAM;  Surgeon: Jovita Kussmaul, MD;  Location: [facility];  Service: General;  Laterality: N/A;  . COLONOSCOPY    . NEPHRECTOMY TRANSPLANTED ORGAN    . TUMOR REMOVAL     Brain        History of present illness and  Hospital Course:     Kindly see H&P for history of present illness and admission details, please review complete Labs, Consult reports and Test reports for all details in brief  HPI  from the history and physical done on the day of admission 12/19/2020   HPI: Patricia [name] is a 87 y.o. female with medical history significant of kidney transplant (2009); pre-DM; hypothyroidism; HTN; CAD; and mild COPD presenting with failure to thrive.  Her husband reports that up to 3 weeks  ago she was cognitively intact and able to mobilize with a wheelchair.  She has not been ambulatory in over a year.  She has slowly been deteriorating over time.  In the last 3 weeks, she has refused to get out of bed and is now essentially non-verbal.  She has been refusing all food and drinks very little.  He feels like he has been slowly watching her die.  He would like to keep her at home and would ideally prefer for her to improve to the point that she is mobile in her wheelchair and able to come to the table for meals again.  He does have a 23yo single son who lives with them and helps to care for them, but he recognizes that they may need more help.      ED Course:  Not eating/drinking, "slowly dying in front of my eyes."  Looks dry.  Maybe needs obs, hydration, nutrition, HH, palliative care  Hospital Course   Incidental COVID-19 infection.   -No hypoxia, chest x-ray stable with no evidence of Covid pneumonia, CRP mildly  elevated on admission suggest mild inflammation, she was treated with 3 days of IV Remdesivir, and IV steroids during hospital stay, no hypoxia, she will be discharged on her home dose prednisone .   SpO2: 100 % O2 Flow Rate (L/min): 0 L/min FiO2 (%): 21 %   Progressive dementia, failure to thrive, severe dehydration and cachexia, severe malnutrition -  Overall poor prognosis, likely has progressive failure to thrive due to significant dementia, malnutrition and poor oral intake, palliative medicine has been consulted, addressed goals of care with the family, patient is confirmed DNR . -Plan to discharge to SNF facility, where recommendation for palliative medicine to keep following that as well . -Keep encouraging to take supplements  History of DVT.  On Eliquis.    Anemia of chronic disease. -Monitors, no signs of bleeding, hemoglobin is stable.   Dyslipidemia.  On statin  Hypothyroidism.  On Synthroid.  TSH stable.  COPD.  No acute issues supportive care.  History of underlying diastolic CHF.  Chronic.  Compensated.  EF 65% on previous echocardiogram  CKD stage IV history of renal transplant.  -  Continue Imuran and cyclosporine.  She is chronically on Bactrim 3 times a week which is also continued.    Resume home dose steroids on discharge, she did require IV fluids during hospital stay.Hypertension.  Continue clonidine for now.  Hypokalemia -Repleted,  Hypophosphatemia -Potassium 2.1 on discharge, she will be discharged on supplements.      Discharge Condition:  Stable at time of discharge, but overall she is frail, deconditioned with high risk for multiple readmissions, palliative medicine to follow as an outpatient.      Discharge Instructions  and  Discharge Medications    Discharge Instructions    Discharge instructions   Complete by: As directed    Follow with Primary MD [name], MD or SNF physician  Get CBC, CMP, checked  by Primary MD  next visit.    Activity: As tolerated with Full fall precautions use walker/cane & assistance as needed   Disposition SNF    Diet: Dysphagia 2 with thin liquid, please encourage oral intake and offer supplements.  On your next visit with your primary care physician please Get Medicines reviewed and adjusted.   Please request your Prim.MD to go over all Hospital Tests and Procedure/Radiological results at the follow up, please get all Hospital records sent to your [facility]  MD by signing hospital release before you go home.   If you experience worsening of your admission symptoms, develop shortness of breath, life threatening emergency, suicidal or homicidal thoughts you must seek medical attention immediately by calling 911 or calling your MD immediately  if symptoms less severe.  You Must read complete instructions/literature along with all the possible adverse reactions/side effects for all the Medicines you take and that have been prescribed to you. Take any new Medicines after you have completely understood and accpet all the possible adverse reactions/side effects.   Do not drive, operating heavy machinery, perform activities at heights, swimming or participation in water activities or provide baby sitting services if your were admitted for syncope or siezures until you have seen by Primary MD or a Neurologist and advised to do so again.  Do not drive when taking Pain medications.    Do not take more than prescribed Pain, Sleep and Anxiety Medications  Special Instructions: If you have smoked or chewed Tobacco  in the last 2 yrs please stop smoking, stop any regular Alcohol  and or any Recreational drug use.  Wear Seat belts while driving.   Please note  You were cared for by a hospitalist during your hospital stay. If you have any questions about your discharge medications or the care you received while you were in the hospital after you are discharged, you can call the unit and  asked to speak with the hospitalist on call if the hospitalist that took care of you is not available. Once you are discharged, your primary care physician will handle any further medical issues. Please note that NO REFILLS for any discharge medications will be authorized once you are discharged, as it is imperative that you return to your primary care physician (or establish a relationship with a primary care physician if you do not have one) for your aftercare needs so that they can reassess your need for medications and monitor your lab values.   Increase activity slowly   Complete by: As directed    No wound care   Complete by: As directed      Allergies as of 12/22/2020      Reactions   Macrolides And Ketolides Nausea Only   Aricept [donepezil Hcl] Nausea And Vomiting   Codeine Nausea And Vomiting   Morphine Nausea And Vomiting   Penicillin G Nausea Only   Penicillins Nausea And Vomiting, Other (See Comments)   HEADACHE Has patient had a PCN reaction causing immediate rash, facial/tongue/throat swelling, SOB or lightheadedness with hypotension: unkn Has patient had a PCN reaction causing severe rash involving mucus membranes or skin necrosis: unkn Has patient had a PCN reaction that required hospitalization: unkn Has patient had a PCN reaction occurring within the last 10 years: unkn If all of the above answers are "NO", then may proceed with Cephalosporin use.   Statins Other (See Comments)   Doesn't remember   Streptomycin Other (See Comments)   headache   Tetracycline Nausea Only   Tetracyclines & Related Rash      Medication List    STOP taking these medications   cycloSPORINE 100 MG capsule Commonly known as: SANDIMMUNE   oxybutynin 10 MG 24 hr tablet Commonly known as: DITROPAN-XL   Xarelto 10 MG Tabs tablet Generic drug: rivaroxaban     TAKE these medications   acetaminophen 325 MG tablet Commonly known as: TYLENOL Take 2 tablets (650 mg total) by mouth every 6  (six) hours as needed.  What changed: reasons to take this   albuterol 108 (90 Base) MCG/ACT inhaler Commonly known as: VENTOLIN HFA Inhale 2 puffs into the lungs every 6 (six) hours as needed for wheezing or shortness of breath.   ALIGN PO Take 1 tablet by mouth daily.   apixaban 2.5 MG Tabs tablet Commonly known as: ELIQUIS Take 1 tablet (2.5 mg total) by mouth 2 (two) times daily.   azaTHIOprine 50 MG tablet Commonly known as: IMURAN Take 1 tablet (50 mg total) by mouth 2 (two) times daily. What changed: when to take this   b complex vitamins tablet Take 1 tablet by mouth daily.   Benadryl Itch Stopping 2 % Gel Generic drug: DIPHENHYDRAMINE HCL (TOPICAL) Apply 1 application topically daily as needed for itch relief   bisacodyl 5 MG EC tablet Commonly known as: DULCOLAX Take 1 tablet (5 mg total) by mouth daily as needed for moderate constipation.   budesonide-formoterol 160-4.5 MCG/ACT inhaler Commonly known as: Symbicort Inhale 2 puffs into the lungs in the morning and at bedtime.   cetirizine 10 MG tablet Commonly known as: ZYRTEC Take 1 tablet (10 mg total) by mouth daily.   cloNIDine 0.1 MG tablet Commonly known as: CATAPRES Take 0.1 mg by mouth 2 (two) times daily.   cycloSPORINE modified 100 MG capsule Commonly known as: NEORAL Take 1 capsule (100 mg total) by mouth 2 (two) times daily.   docusate sodium 100 MG capsule Commonly known as: COLACE Take 1 capsule (100 mg total) by mouth 2 (two) times daily.   famotidine 20 MG tablet Commonly known as: PEPCID Take 1 tablet (20 mg total) by mouth daily with supper.   ferrous sulfate 325 (65 FE) MG tablet Take 1 tablet (325 mg total) by mouth daily with breakfast.   FLUoxetine 20 MG tablet Commonly known as: PROZAC Take 1 tablet (20 mg total) by mouth daily.   fluticasone 50 MCG/ACT nasal spray Commonly known as: FLONASE Place 1 spray into both nostrils daily as needed for allergies or rhinitis.    Glucerna Liqd Take 237 mLs by mouth 2 (two) times daily.   levothyroxine 75 MCG tablet Commonly known as: SYNTHROID Take 1 tablet (75 mcg total) by mouth daily.   Lutein-Zeaxanthin 25-5 MG Caps Take 1 capsule by mouth daily.   Lysine 500 MG Tabs Take 500 mg by mouth daily.   metoCLOPramide 5 MG tablet Commonly known as: REGLAN Take 1 tablet (5 mg total) by mouth 4 (four) times daily.   mirabegron ER 50 MG Tb24 tablet Commonly known as: Myrbetriq Take 1 tablet (50 mg total) by mouth every evening.   mirtazapine 15 MG tablet Commonly known as: REMERON Take 0.5 tablets (7.5 mg total) by mouth at bedtime.   multivitamin with minerals Tabs tablet Take 1 tablet by mouth daily.   omeprazole 20 MG capsule Commonly known as: PRILOSEC Take 1 capsule (20 mg total) by mouth daily.   ondansetron 4 MG tablet Commonly known as: ZOFRAN Take 1 tablet (4 mg total) by mouth every 4 (four) hours as needed. What changed: reasons to take this   phenylephrine-shark liver oil-mineral oil-petrolatum 0.25-3-14-71.9 % rectal ointment Commonly known as: PREPARATION H Place 1 application rectally 2 (two) times daily as needed for hemorrhoids.   phosphorus 155-852-130 MG tablet Commonly known as: K PHOS NEUTRAL Take 2 tablets (500 mg total) by mouth daily for 5 days.   polyethylene glycol 17 g packet Commonly known as: MIRALAX / GLYCOLAX Take 17 g by mouth  daily as needed for mild constipation.   pravastatin 40 MG tablet Commonly known as: PRAVACHOL Take 1 tablet (40 mg total) by mouth daily.   predniSONE 5 MG tablet Commonly known as: DELTASONE Take 1 tablet (5 mg total) by mouth daily with breakfast.   sulfamethoxazole-trimethoprim 400-80 MG tablet Commonly known as: BACTRIM Take 1 tablet by mouth every Monday, Wednesday, and Friday.   Vitamin D 50 MCG (2000 UT) tablet Take 1 tablet (2,000 Units total) by mouth every evening.         Diet and Activity recommendation: See  Discharge Instructions above   Consults obtained -  Palliative medicine   Major procedures and Radiology Reports - PLEASE review detailed and final reports for all details, in brief -      DG Chest Portable 1 View  Result Date: 12/19/2020 CLINICAL DATA:  Altered mental status EXAM: PORTABLE CHEST 1 VIEW COMPARISON:  04/11/2020 FINDINGS: Hyperinflation. No new consolidation or edema. Left lung calcified granuloma is again identified. No pleural effusion. Stable cardiomediastinal contours with normal heart size. IMPRESSION: No acute process in the chest. Electronically Signed   By: Macy Mis M.D.   On: 12/19/2020 14:52    Micro Results    Recent Results (from the past 240 hour(s))  Resp Panel by RT-PCR (Flu A&B, Covid) Nasopharyngeal Swab     Status: Abnormal   Collection Time: 12/19/20  2:19 PM   Specimen: Nasopharyngeal Swab; Nasopharyngeal(NP) swabs in vial transport medium  Result Value Ref Range Status   SARS Coronavirus 2 by RT PCR POSITIVE (A) NEGATIVE Final    Comment: RESULT CALLED TO, READ BACK BY AND VERIFIED WITH: RN Jarrett Soho A [id number] fcp.  (NOTE) SARS-CoV-2 target nucleic acids are DETECTED.  The SARS-CoV-2 RNA is generally detectable in upper respiratory specimens during the acute phase of infection. Positive results are indicative of the presence of the identified virus, but do not rule out bacterial infection or co-infection with other pathogens not detected by the test. Clinical correlation with patient history and other diagnostic information is necessary to determine patient infection status. The expected result is Negative.  Fact Sheet for Patients: EntrepreneurPulse.com.au  Fact Sheet for Healthcare Providers: IncredibleEmployment.be  This test is not yet approved or cleared by the [place] FDA and  has been authorized for detection and/or diagnosis of SARS-CoV-2 by FDA under an Emergency Use  Authorization (EUA).  This EUA will remain in effect (meaning this test can be use d) for the duration of  the COVID-19 declaration under Section 564(b)(1) of the Act, 21 U.S.C. section 360bbb-3(b)(1), unless the authorization is terminated or revoked sooner.     Influenza A by PCR NEGATIVE NEGATIVE Final   Influenza B by PCR NEGATIVE NEGATIVE Final    Comment: (NOTE) The Xpert Xpress SARS-CoV-2/FLU/RSV plus assay is intended as an aid in the diagnosis of influenza from Nasopharyngeal swab specimens and should not be used as a sole basis for treatment. Nasal washings and aspirates are unacceptable for Xpert Xpress SARS-CoV-2/FLU/RSV testing.  Fact Sheet for Patients: EntrepreneurPulse.com.au  Fact Sheet for Healthcare Providers: IncredibleEmployment.be  This test is not yet approved or cleared by the [place] FDA and has been authorized for detection and/or diagnosis of SARS-CoV-2 by FDA under an Emergency Use Authorization (EUA). This EUA will remain in effect (meaning this test can be used) for the duration of the COVID-19 declaration under Section 564(b)(1) of the Act, 21 U.S.C. section 360bbb-3(b)(1), unless the authorization is terminated or revoked.  Performed at [facility], [place] [address] [place], [place] [address]   Urine culture     Status: None (Preliminary result)   Collection Time: 12/19/20  4:27 PM   Specimen: Urine, Random  Result Value Ref Range Status   Specimen Description URINE, RANDOM  Final   Special Requests NONE  Final   Culture   Final    CULTURE REINCUBATED FOR BETTER GROWTH Performed at [facility], [place] [address], [place], [place] [address]    Report Status PENDING  Incomplete       Today   Subjective:   Patricia [name] today herself denies any complaints, no significant events overnight as discussed with staff.  Objective:   Blood pressure (!) 147/59, pulse (!) 59, temperature  97.6 F (36.4 C), temperature source Axillary, resp. rate 18, height '5\' 1"'$  (1.549 m), weight 43.9 kg, SpO2 98 %.   Intake/Output Summary (Last 24 hours) at 12/22/2020 1133 Last data filed at 12/21/2020 2031 Gross per 24 hour  Intake 823.28 ml  Output 350 ml  Net 473.28 ml    Exam  Patient is awake, extremely frail, cachectic and deconditioned, in no apparent distress.   Symmetrical Chest wall movement, Good air movement bilaterally, CTAB RRR,No Gallops,Rubs or new Murmurs, No Parasternal Heave +ve B.Sounds, Abd Soft, Non tender, No organomegaly appriciated, No rebound -guarding or rigidity. No Cyanosis, Clubbing or edema, No new Rash or bruise  Data Review   CBC w Diff:  Lab Results  Component Value Date   WBC 5.1 12/22/2020   HGB 9.8 (L) 12/22/2020   HCT 29.5 (L) 12/22/2020   PLT 182 12/22/2020   LYMPHOPCT 16 12/22/2020   MONOPCT 10 12/22/2020   EOSPCT 0 12/22/2020   BASOPCT 0 12/22/2020    CMP:  Lab Results  Component Value Date   NA 141 12/22/2020   NA 142 04/26/2020   K 3.3 (L) 12/22/2020   CL 113 (H) 12/22/2020   CO2 19 (L) 12/22/2020   BUN 54 (H) 12/22/2020   BUN 22 (A) 04/26/2020   CREATININE 1.92 (H) 12/22/2020   GLU 87 04/26/2020   PROT 5.4 (L) 12/22/2020   ALBUMIN 2.1 (L) 12/22/2020   BILITOT 0.5 12/22/2020   ALKPHOS 56 12/22/2020   AST 17 12/22/2020   ALT 8 12/22/2020  .   Total Time in preparing paper work, data evaluation and todays exam - 28 minutes  Phillips Climes M.D on 12/22/2020 at 11:33 AM  [facility]   Office  [phone]

## 2020-12-23 ENCOUNTER — Non-Acute Institutional Stay (SKILLED_NURSING_FACILITY): Payer: Medicare PPO | Admitting: Adult Health

## 2020-12-23 ENCOUNTER — Encounter: Payer: Self-pay | Admitting: Adult Health

## 2020-12-23 DIAGNOSIS — F0390 Unspecified dementia without behavioral disturbance: Secondary | ICD-10-CM

## 2020-12-23 DIAGNOSIS — I825Y2 Chronic embolism and thrombosis of unspecified deep veins of left proximal lower extremity: Secondary | ICD-10-CM | POA: Diagnosis not present

## 2020-12-23 DIAGNOSIS — N184 Chronic kidney disease, stage 4 (severe): Secondary | ICD-10-CM

## 2020-12-23 DIAGNOSIS — E034 Atrophy of thyroid (acquired): Secondary | ICD-10-CM

## 2020-12-23 DIAGNOSIS — R627 Adult failure to thrive: Secondary | ICD-10-CM

## 2020-12-23 DIAGNOSIS — F339 Major depressive disorder, recurrent, unspecified: Secondary | ICD-10-CM

## 2020-12-23 DIAGNOSIS — U071 COVID-19: Secondary | ICD-10-CM

## 2020-12-23 LAB — URINE CULTURE: Culture: >=100000 — AB

## 2020-12-23 NOTE — Progress Notes (Signed)
Palliative Medicine RN Note: Rec'd request from PMT NP Gregary Signs to make referral to [facility] for [facility]. I sent a secure chat to their liaison Cheri, who will follow up.  Marjie Skiff Modena Bellemare, RN, BSN, [facility] Palliative Medicine Team 12/23/2020 9:15 AM Office [phone]

## 2020-12-23 NOTE — Progress Notes (Signed)
Location:  [place] Room Number: [place] of Service:  SNF (31) Provider:  Durenda Age, [place], FNP-BC  Patient Care Team: [name], MD as PCP - General (Internal Medicine)  Extended Emergency Contact Information Primary Emergency Contact: Mun[place], [place] Address: [address]          [place] Litter of [place] Phone: [phone] Mobile Phone: [phone] Relation: Spouse Secondary Emergency Contact: Franki Cabot, [place] of [place] Mobile Phone: [phone] Relation: Son  Code Status:    Goals of care: Advanced Directive information Advanced Directives 12/22/2020  Does Patient Have a Medical Advance Directive? No  Type of Advance Directive -  Does patient want to make changes to medical advance directive? -  Copy of [place] in Chart? -  Would patient like information on creating a medical advance directive? No - Patient declined  Pre-existing out of facility DNR order (yellow form or pink MOST form) -     Chief Complaint  Patient presents with  . Acute Visit    Follow up hospitalization    HPI:  Pt is a 87 y.o. female who was admitted to [facility] on 12/22/20 post [facility] admission 12/19/20 to 12/22/20. She has a PMH of pre-diabetes, hypothyroidism, hypertension, CAD and mild COPD. Husband stated that she was cognitively intact and able to mobilize 3 weeks prior to hospitalization. She has refused to get out of bed and was essentially nonverbal. She has not been ambulatory in over a year. She has been refusing all food and drinks very little. She was incidentally found to have COVID-19 infection. Chest x-ray was negative for pneumonia, CRP mildly elevated. She was treated with 3 days of IV Remdesevir and IV steroids. Palliative was consulted for failure to thrive due to progressive dementia.  She was seen in the room with her husband at bedside.  No noted coughing nor SOB.   Past Medical History:  Diagnosis Date  . Abnormal gait   . Anxiety   . Arthritis   . Asthma   . COPD, mild ([place])   . Coronary artery disease    pt denies  . Depression   . Diverticulitis   . Dizziness   . DVT (deep venous thrombosis) ([place])   . GERD (gastroesophageal reflux disease)   . Hypertension   . Hypothyroidism   . Multiple allergies   . Pneumonia   . Pre-diabetes   . Reflux   . Renal disorder    had kidney transplant in 2009   Past Surgical History:  Procedure Laterality Date  . ANTERIOR APPROACH HEMI HIP ARTHROPLASTY Right 02/10/2016   Procedure: TOTAL HIP ARTHROPLASTY ANTERIOR APPROACH;  Surgeon: Rod Can, MD;  Location: [facility];  Service: Orthopedics;  Laterality: Right;  . CHOLECYSTECTOMY N/A 07/01/2018   Procedure: LAPAROSCOPIC CHOLECYSTECTOMY WITH INTRAOPERATIVE CHOLANGIOGRAM;  Surgeon: Jovita Kussmaul, MD;  Location: [facility];  Service: General;  Laterality: N/A;  . COLONOSCOPY    . NEPHRECTOMY TRANSPLANTED ORGAN    . TUMOR REMOVAL     Brain     Allergies  Allergen Reactions  . Macrolides And Ketolides Nausea Only  . Aricept [Donepezil Hcl] Nausea And Vomiting  . Codeine Nausea And Vomiting  . Morphine Nausea And Vomiting  . Penicillin G Nausea Only  . Penicillins Nausea And Vomiting and Other (See Comments)    HEADACHE Has patient had a PCN reaction causing  immediate rash, facial/tongue/throat swelling, SOB or lightheadedness with hypotension: unkn Has patient had a PCN reaction causing severe rash involving mucus membranes or skin necrosis: unkn Has patient had a PCN reaction that required hospitalization: unkn Has patient had a PCN reaction occurring within the last 10 years: unkn If all of the above answers are "NO", then may proceed with Cephalosporin use.   . Statins Other (See Comments)    Doesn't remember  . Streptomycin Other (See Comments)    headache   . Tetracycline Nausea Only  . Tetracyclines & Related  Rash    Outpatient Encounter Medications as of 12/23/2020  Medication Sig  . acetaminophen (TYLENOL) 325 MG tablet Take 2 tablets (650 mg total) by mouth every 6 (six) hours as needed. (Patient taking differently: Take 650 mg by mouth every 6 (six) hours as needed for mild pain.)  . albuterol (VENTOLIN HFA) 108 (90 Base) MCG/ACT inhaler Inhale 2 puffs into the lungs every 6 (six) hours as needed for wheezing or shortness of breath.  Marland Kitchen apixaban (ELIQUIS) 2.5 MG TABS tablet Take 1 tablet (2.5 mg total) by mouth 2 (two) times daily.  Marland Kitchen azaTHIOprine (IMURAN) 50 MG tablet Take 1 tablet (50 mg total) by mouth 2 (two) times daily. (Patient taking differently: Take 50 mg by mouth in the morning and at bedtime.)  . b complex vitamins tablet Take 1 tablet by mouth daily.  . bisacodyl (DULCOLAX) 5 MG EC tablet Take 1 tablet (5 mg total) by mouth daily as needed for moderate constipation.  . budesonide-formoterol (SYMBICORT) 160-4.5 MCG/ACT inhaler Inhale 2 puffs into the lungs in the morning and at bedtime.  . cetirizine (ZYRTEC) 10 MG tablet Take 1 tablet (10 mg total) by mouth daily.  . Cholecalciferol (VITAMIN D) 50 MCG (2000 UT) tablet Take 1 tablet (2,000 Units total) by mouth every evening.  . cloNIDine (CATAPRES) 0.1 MG tablet Take 0.1 mg by mouth 2 (two) times daily.  . cycloSPORINE modified (NEORAL) 100 MG capsule Take 1 capsule (100 mg total) by mouth 2 (two) times daily. (Patient not taking: Reported on 12/20/2020)  . DIPHENHYDRAMINE HCL, TOPICAL, (BENADRYL ITCH STOPPING) 2 % GEL Apply 1 application topically daily as needed for itch relief  . docusate sodium (COLACE) 100 MG capsule Take 1 capsule (100 mg total) by mouth 2 (two) times daily. (Patient not taking: Reported on 12/20/2020)  . famotidine (PEPCID) 20 MG tablet Take 1 tablet (20 mg total) by mouth daily with supper.  . ferrous sulfate 325 (65 FE) MG tablet Take 1 tablet (325 mg total) by mouth daily with breakfast.  . FLUoxetine (PROZAC) 20  MG tablet Take 1 tablet (20 mg total) by mouth daily.  . fluticasone (FLONASE) 50 MCG/ACT nasal spray Place 1 spray into both nostrils daily as needed for allergies or rhinitis.  . Glucerna (GLUCERNA) LIQD Take 237 mLs by mouth 2 (two) times daily. (Patient not taking: Reported on 12/20/2020)  . levothyroxine (SYNTHROID) 75 MCG tablet Take 1 tablet (75 mcg total) by mouth daily.  . Lutein-Zeaxanthin 25-5 MG CAPS Take 1 capsule by mouth daily.  Marland Kitchen Lysine 500 MG TABS Take 500 mg by mouth daily.  . metoCLOPramide (REGLAN) 5 MG tablet Take 1 tablet (5 mg total) by mouth 4 (four) times daily. (Patient not taking: Reported on 12/20/2020)  . mirabegron ER (MYRBETRIQ) 50 MG TB24 tablet Take 1 tablet (50 mg total) by mouth every evening.  . mirtazapine (REMERON) 15 MG tablet Take 0.5 tablets (7.5 mg total)  by mouth at bedtime. (Patient not taking: Reported on 12/20/2020)  . Multiple Vitamin (MULTIVITAMIN WITH MINERALS) TABS tablet Take 1 tablet by mouth daily. (Patient not taking: Reported on 12/20/2020)  . omeprazole (PRILOSEC) 20 MG capsule Take 1 capsule (20 mg total) by mouth daily. (Patient not taking: Reported on 12/20/2020)  . ondansetron (ZOFRAN) 4 MG tablet Take 1 tablet (4 mg total) by mouth every 4 (four) hours as needed. (Patient taking differently: Take 4 mg by mouth every 4 (four) hours as needed for nausea or vomiting.)  . phenylephrine-shark liver oil-mineral oil-petrolatum (PREPARATION H) 0.25-3-14-71.9 % rectal ointment Place 1 application rectally 2 (two) times daily as needed for hemorrhoids.  . phosphorus (K PHOS NEUTRAL) 155-852-130 MG tablet Take 2 tablets (500 mg total) by mouth daily for 5 days.  . polyethylene glycol (MIRALAX / GLYCOLAX) 17 g packet Take 17 g by mouth daily as needed for mild constipation.  . pravastatin (PRAVACHOL) 40 MG tablet Take 1 tablet (40 mg total) by mouth daily. (Patient not taking: Reported on 12/20/2020)  . predniSONE (DELTASONE) 5 MG tablet Take 1 tablet (5 mg total)  by mouth daily with breakfast.  . Probiotic Product (ALIGN PO) Take 1 tablet by mouth daily.  Marland Kitchen sulfamethoxazole-trimethoprim (BACTRIM) 400-80 MG tablet Take 1 tablet by mouth every Monday, Wednesday, and Friday.   No facility-administered encounter medications on file as of 12/23/2020.    Review of Systems  GENERAL: No fever or chills  MOUTH and THROAT: Denies oral discomfort, gingival pain or bleeding RESPIRATORY: no cough, SOB, DOE, wheezing, hemoptysis CARDIAC: No chest pain, edema or palpitations GI: No abdominal pain, diarrhea, constipation, heart burn, nausea or vomiting GU: Denies dysuria, frequency, hematuria or discharge NEUROLOGICAL: Denies dizziness, syncope, numbness, or headache PSYCHIATRIC: Denies feelings of depression or anxiety. No report of hallucinations, insomnia, paranoia, or agitation   Immunization History  Administered Date(s) Administered  . Influenza,inj,Quad PF,6+ Mos 07/28/2013  . Moderna Sars-Covid-2 Vaccination 11/28/2019  . PPD Test 02/14/2016   Pertinent  Health Maintenance Due  Topic Date Due  . FOOT EXAM  Never done  . OPHTHALMOLOGY EXAM  Never done  . PNA vac Low Risk Adult (1 of 2 - PCV13) Never done  . HEMOGLOBIN A1C  12/30/2018  . INFLUENZA VACCINE  05/15/2020  . DEXA SCAN  Completed   No flowsheet data found.   Vitals:   12/23/20 1000  BP: 128/65  Pulse: 66  Resp: 20  Temp: 97.7 F (36.5 C)  Weight: 106 lb (48.1 kg)  Height: '5\' 1"'$  (1.549 m)   Body mass index is 20.03 kg/m.  Physical Exam  GENERAL APPEARANCE:  In no acute distress. Normal body habitus SKIN:  Skin is warm and dry.  MOUTH and THROAT: Lips are without lesions. Oral mucosa is moist and without lesions. Tongue is normal in shape, size, and color and without lesions RESPIRATORY: Breathing is even & unlabored, BS CTAB CARDIAC: RRR, no murmur,no extra heart sounds, no edema GI: Abdomen soft, normal BS, no masses, no tenderness NEUROLOGICAL: There is no tremor.  Speech is clear. Alert to self, disoriented to time and place. PSYCHIATRIC:  Affect and behavior are appropriate  Labs reviewed: Recent Labs    12/20/20 0721 12/21/20 0110 12/22/20 0225  NA 143 141 141  K 3.7 2.8* 3.3*  CL 114* 115* 113*  CO2 19* 17* 19*  GLUCOSE 136* 154* 255*  BUN 46* 50* 54*  CREATININE 1.87* 1.62* 1.92*  CALCIUM 9.5 9.1 9.3  MG 2.1  2.0 2.1  PHOS  --   --  2.1*   Recent Labs    12/20/20 0721 12/21/20 0110 12/22/20 0225  AST '18 17 17  '$ ALT '9 9 8  '$ ALKPHOS 61 49 56  BILITOT 0.8 0.5 0.5  PROT 7.1 5.8* 5.4*  ALBUMIN 2.7* 2.2* 2.1*   Recent Labs    04/11/20 1033 04/12/20 0255 12/20/20 1657 12/21/20 0110 12/22/20 0225  WBC 10.7*   < > 5.9 5.8 5.1  NEUTROABS 7.8*  --   --  4.6 3.8  HGB 12.2   < > 10.4* 10.5* 9.8*  HCT 37.1   < > 31.2* 30.1* 29.5*  MCV 97.6   < > 95.1 93.5 97.0  PLT 224   < > 179 180 182   < > = values in this interval not displayed.   Lab Results  Component Value Date   TSH 0.587 12/19/2020   Lab Results  Component Value Date   HGBA1C 5.1 07/01/2018    Significant Diagnostic Results in last 30 days:  DG Chest Portable 1 View  Result Date: 12/19/2020 CLINICAL DATA:  Altered mental status EXAM: PORTABLE CHEST 1 VIEW COMPARISON:  04/11/2020 FINDINGS: Hyperinflation. No new consolidation or edema. Left lung calcified granuloma is again identified. No pleural effusion. Stable cardiomediastinal contours with normal heart size. IMPRESSION: No acute process in the chest. Electronically Signed   By: Macy Mis M.D.   On: 12/19/2020 14:52    Assessment/Plan  1. Failure to thrive in adult -   Secondary progressive dementia with poor oral intake, malnutrition -    Palliative care was consulted and recommended to follow-up in the SNF -     Continue mirtazapine  to increase appetite  2. COVID-19 virus infection -Was treated with 3 days of IV remdesivir and IV steroids  3. Hypothyroidism due to acquired atrophy of thyroid Lab  Results  Component Value Date   TSH 0.587 12/19/2020   Continue levothyroxine  4. Chronic deep vein thrombosis (DVT) of proximal vein of left lower extremity (HCC) -   continue Eliquis  5. Depression, recurrent ([place]) -   Continue fluoxetine  6. Stage 4 chronic kidney disease (HCC) S/P Renal transplant Lab Results  Component Value Date   NA 141 12/22/2020   K 3.3 (L) 12/22/2020   CO2 19 (L) 12/22/2020   GLUCOSE 255 (H) 12/22/2020   BUN 54 (H) 12/22/2020   CREATININE 1.92 (H) 12/22/2020   CALCIUM 9.3 12/22/2020   GFRNONAA 25 (L) 12/22/2020   GFRAA 38.77 04/26/2020    7. Dementia without behavioral disturbance, unspecified dementia type [facility]) -    continue supportive care -    Fall precautions     Family/ staff Communication:  Discussed plan of care with resident, husband and charge nurse  Labs/tests ordered: None  Goals of care:   Long-term care   Durenda Age, DNP, MSN, FNP-BC [facility] [phone] (Monday-Friday 8:00 a.m. - 5:00 p.m.) [phone] (after hours)

## 2020-12-27 ENCOUNTER — Encounter: Payer: Self-pay | Admitting: Internal Medicine

## 2020-12-27 ENCOUNTER — Non-Acute Institutional Stay (SKILLED_NURSING_FACILITY): Payer: Medicare PPO | Admitting: Internal Medicine

## 2020-12-27 DIAGNOSIS — I10 Essential (primary) hypertension: Secondary | ICD-10-CM | POA: Diagnosis not present

## 2020-12-27 DIAGNOSIS — U071 COVID-19: Secondary | ICD-10-CM | POA: Diagnosis not present

## 2020-12-27 DIAGNOSIS — R627 Adult failure to thrive: Secondary | ICD-10-CM

## 2020-12-27 DIAGNOSIS — E43 Unspecified severe protein-calorie malnutrition: Secondary | ICD-10-CM | POA: Diagnosis not present

## 2020-12-27 NOTE — Assessment & Plan Note (Addendum)
Nutrition consult with protein supplementation at SNF.

## 2020-12-27 NOTE — Assessment & Plan Note (Addendum)
She remains afebrile and O2 sats on room air are good. No abnormal breath sounds.

## 2020-12-27 NOTE — Assessment & Plan Note (Addendum)
PT/OT and Nutrition consults at SNF.

## 2020-12-27 NOTE — Assessment & Plan Note (Addendum)
Blood pressure is in the 160-170 range indicating suboptimal control.  Antihypertensive regimen will be adjusted. She is on Catapres which is on the [place]ers' List. Low-dose metoprolol will be initiated twice daily with titration as indicated clinically.   Despite slight peripheral edema, clinically no CHF.

## 2020-12-27 NOTE — Progress Notes (Signed)
NURSING HOME LOCATION:  [facility] ROOM NUMBER:  [id number]  CODE STATUS:  DNR  PCP:  [name] MD  This is a comprehensive admission note to this SNF performed on this date less than 30 days from date of admission. Included are preadmission medical/surgical history; reconciled medication list; family history; social history and comprehensive review of systems.  Corrections and additions to the records were documented. Comprehensive physical exam was also performed. Additionally a clinical summary was entered for each active diagnosis pertinent to this admission in the Problem List to enhance continuity of care.  HPI: She was hospitalized 3/7-3/07/2021 admitted from home with adult failure to thrive in the context of advanced progressive dementia. Her husband reported that 3 weeks PTA she had been at her baseline neurocognitive status and able to mobilize with a wheelchair.  She had been nonambulatory for over a year. Over the ensuing 3 weeks, she refused to get out of bed and became essentially nonverbal.  She had been refusing all food and was drinking very little.  In addition to her husband a 38 year old single son lives in the home yet they questioned that they could meet her progressive healthcare needs. An "incidental" finding was COVID-19 positive screen without associated hypoxia or evidence radiographically of Covid pneumonia.  CRP was 12 suggesting some inflammatory component and she received 3 days of IV remdesivir and IV steroids. Hypokalemia and hypophosphatemia were repleted.  Maintenance steroids were resumed at discharge. She was discharged to the SNF for rehab and possible return home.  Palliative care was to be considered should there be no improvement.  Past medical and surgical history: Includes history of DVT, history of polymyalgia rheumatica, diabetes with CKD, GERD, history of diverticulitis, history of asthma, anemia of chronic disease,  dyslipidemia, hypothyroidism, COPD, and history of CHF. There is a history of renal transplant for CKD stage IV.  The patient was on suppressive Imuran and cyclosporine.  She also takes prophylactic Bactrim 3 times a week.  Other surgeries include cholecystectomy, colonoscopy, history of "brain tumor removal", and hip hemiarthroplasty.  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  Family history: Noncontributory due to advanced age.   Review of systems: Clinical neurocognitive deficits made validity of responses questionable & preventing ROS completion.  When asked why she had been in the hospital her response was "lost weight".  She could not tell me why she lost the weight.  She could not give me the date.  All responses were an almost whispered "no". Staff reports she is extremely weak when she attempts to stand with help.  Physical exam:  Pertinent or positive findings: She appears chronically ill and  cachectic. Hair is disheveled.  She has few mandibular teeth; no partial or denture worn. Facies are wrinkled.  She stares blankly, focusing only on eating the pured diet in front of her.  She appears to be  somewhat hard of hearing, .  Breath sounds are decreased.  Heart was heard at the lower left sternal border.  Abdomen is protuberant.  Pedal pulses are decreased.  Nonpitting edema is present in left lower extremity.  The most striking physical findings are dermatologic with diffuse skin lesions including bruising, keratoses, freckling, and sclerotic changes.  Extremities are atrophic.  General appearance:  no acute distress, increased work of breathing is present.   Lymphatic: No lymphadenopathy about the head, neck, axilla. Eyes: No conjunctival inflammation or lid edema is present. There is no scleral icterus.  Ears:  External ear exam shows no significant lesions or deformities.   Nose:  External nasal examination shows no deformity or  inflammation. Nasal mucosa are pink and moist without lesions, exudates Oral exam: Lips and gums are healthy appearing.There is no oropharyngeal erythema or exudate. Neck:  No thyromegaly, masses, tenderness noted.    Heart:  No gallop, murmur, click, rub.  Lungs:  without wheezes, rhonchi, rales, rubs. Abdomen: Bowel sounds are normal.  Abdomen is soft and nontender with no organomegaly, hernias, masses. GU: Deferred  Extremities:  No cyanosis, clubbing Neurologic exam:Balance, Rhomberg, finger to nose testing could not be completed due to clinical state Skin: Warm & dry   See clinical summary under each active problem in the Problem List with associated updated therapeutic plan

## 2020-12-28 NOTE — Patient Instructions (Signed)
See assessment and plan under each diagnosis in the problem list and acutely for this visit 

## 2021-01-03 ENCOUNTER — Non-Acute Institutional Stay: Payer: Self-pay | Admitting: Hospice

## 2021-01-03 ENCOUNTER — Other Ambulatory Visit: Payer: Self-pay

## 2021-01-03 DIAGNOSIS — Z515 Encounter for palliative care: Secondary | ICD-10-CM | POA: Diagnosis not present

## 2021-01-03 DIAGNOSIS — R531 Weakness: Secondary | ICD-10-CM

## 2021-01-03 NOTE — Progress Notes (Signed)
PATIENT NAME: Patricia [name] [place] [id number] [phone] (home)  DOB: [birth year] MRN: [id number]  PRIMARY CARE PROVIDER:    [name], MD,  [address] [place] [address] [phone]  REFERRING PROVIDER:   Dr. Gean Birchwood RESPONSIBLE PARTY: self/spouse [name] Emergency Contact: [name] - son [phone]  CHIEF COMPLAIN: Initial palliative care visit/weakness  Visit is to build trust and highlight Palliative Medicine as specialized medical care for people living with serious illness, aimed at facilitating better quality of life through symptoms relief, assisting with advance care plan and establishing goals of care.  And endorsed palliative service.  NP called and spoke with son [name] updating him on service/visit. Discussion on the difference between Palliative and Hospice care.  Visit consisted of counseling and education dealing with the complex and emotionally intense issues of symptom management and palliative care in the setting of serious and potentially life-threatening illness. Palliative care team will continue to support patient, patient's family, and medical team.  RECOMMENDATIONS/PLAN:   Advance Care Planning: Our advance care planning conversation included a discussion about  the value and importance of advance care planning, exploration of goals of care in the event of a sudden injury or illness, identification and preparation of a healthcare agent and review and updating or creation of an advance directive document. Provided general support, encouragement and ample emotional support.  CODE STATUS: Patient is a DO NOT RESUSCITATE.  Signed DNR form in facility records and in epic.  GOALS OF CARE: Goals of care include to maximize quality of life and symptom management.  I spent 46 minutes providing this initial consultation. More than 50% of the time in this consultation was spent on counseling patient and coordinating  communication. -------------------------------------------------------------------------------------------------------------------------------------- Symptom Management/Plan:  Weakness: Continue OT/PT.  Balance of rest and performance activity. COPD: Continue Symbicort and albuterol as ordered.  Patient not currently on oxygen, no exacerbation at this time. Palliative will continue to monitor for symptom management/decline and make recommendations as needed. Return 6 weeks or prn. Encouraged to call provider sooner with any concerns.  PPS: 40% Family /Caregiver/Community Supports: Patient in SNF for acute rehab.   HISTORY OF PRESENT ILLNESS:  Patricia [name] is a 87 y.o. female with multiple medical problems including chronic generalized body weakness, with recent hospitalization 3/7-3/07/2021 for adult failure to thrive in the context of advanced progressive dementia.  Patient endorsed weakness reporting it impairs her activities of daily living; improving with ongoing physical therapy.  While in the hospital patient tested positive for COVID-19 virus; in no respiratory distress at this time; she received remdesivir, steroids per hospital protocol.  History of diabetes with CKD, dementia, CHF, asthma, COPD, renal transplant for CKD stage V. History obtained from review of EMR, discussion with patient/family.   Review and summarization of Epic records shows history from other than patient. Rest of 10 point ROS asked and negative.  Palliative Care was asked to follow this patient by consultation request of [name], MD to help address complex decision making in the context of advance care planning and goals of care clarification.    HOSPICE ELIGIBILITY/DIAGNOSIS: TBD  PAST MEDICAL HISTORY:  Past Medical History:  Diagnosis Date  . Abnormal gait   . Anxiety   . Arthritis   . Asthma   . COPD, mild ([place])   . Coronary artery disease    pt denies  . Depression   . Diverticulitis   .  Dizziness   .  DVT (deep venous thrombosis) ([place])   . GERD (gastroesophageal reflux disease)   . Hypertension   . Hypothyroidism   . Multiple allergies   . Pneumonia   . Pre-diabetes   . Reflux   . Renal disorder    had kidney transplant in 2009     SOCIAL HX:  Social History   Tobacco Use  . Smoking status: Never Smoker  . Smokeless tobacco: Never Used  Substance Use Topics  . Alcohol use: No    FAMILY HX:  Family History  Problem Relation Age of Onset  . Alzheimer's disease Mother   . High Cholesterol Mother   . Alcoholism Father     Review of lab tests/diagnostics     Ref. Range 12/22/2020 02:25  COMPREHENSIVE METABOLIC PANEL Unknown Rpt (A)  Sodium Latest Ref Range: 135 - 145 mmol/L 141  Potassium Latest Ref Range: 3.5 - 5.1 mmol/L 3.3 (L)  Chloride Latest Ref Range: 98 - 111 mmol/L 113 (H)  CO2 Latest Ref Range: 22 - 32 mmol/L 19 (L)  Glucose Latest Ref Range: 70 - 99 mg/dL 255 (H)  BUN Latest Ref Range: 8 - 23 mg/dL 54 (H)  Creatinine Latest Ref Range: 0.44 - 1.00 mg/dL 1.92 (H)  Calcium Latest Ref Range: 8.9 - 10.3 mg/dL 9.3  Anion gap Latest Ref Range: 5 - 15  9  Phosphorus Latest Ref Range: 2.5 - 4.6 mg/dL 2.1 (L)  Magnesium Latest Ref Range: 1.7 - 2.4 mg/dL 2.1  Alkaline Phosphatase Latest Ref Range: 38 - 126 U/L 56  Albumin Latest Ref Range: 3.5 - 5.0 g/dL 2.1 (L)  AST Latest Ref Range: 15 - 41 U/L 17  ALT Latest Ref Range: 0 - 44 U/L 8  Total Protein Latest Ref Range: 6.5 - 8.1 g/dL 5.4 (L)  Total Bilirubin Latest Ref Range: 0.3 - 1.2 mg/dL 0.5  GFR, Estimated Latest Ref Range: >60 mL/min 25 (L)  Latest GFR by Cockcroft Gault (not valid in AKI or ESRD) Estimated Creatinine Clearance: 15.9 mL/min (A) (by C-G formula based on SCr of 1.92 mg/dL (H)). No results for input(s): AST, ALT, ALKPHOS, GGT in the last 168 hours.  Invalid input(s): TBILI, CONJBILI, ALB, TOTALPROTEIN No components found for: ALB No results for input(s): APTT, INR in the last 168  hours.  Invalid input(s): PTPATIENT No results for input(s): BNP, PROBNP in the last 168 hours.  ALLERGIES:  Allergies  Allergen Reactions  . Macrolides And Ketolides Nausea Only  . Aricept [Donepezil Hcl] Nausea And Vomiting  . Codeine Nausea And Vomiting  . Morphine Nausea And Vomiting  . Penicillin G Nausea Only  . Penicillins Nausea And Vomiting and Other (See Comments)    HEADACHE Has patient had a PCN reaction causing immediate rash, facial/tongue/throat swelling, SOB or lightheadedness with hypotension: unkn Has patient had a PCN reaction causing severe rash involving mucus membranes or skin necrosis: unkn Has patient had a PCN reaction that required hospitalization: unkn Has patient had a PCN reaction occurring within the last 10 years: unkn If all of the above answers are "NO", then may proceed with Cephalosporin use.   . Statins Other (See Comments)    Doesn't remember  . Streptomycin Other (See Comments)    headache   . Tetracycline Nausea Only  . Tetracyclines & Related Rash      PERTINENT MEDICATIONS:  Outpatient Encounter Medications as of 01/03/2021  Medication Sig  . acetaminophen (TYLENOL) 325 MG tablet Take 2 tablets (650 mg total) by mouth  every 6 (six) hours as needed.  Marland Kitchen albuterol (VENTOLIN HFA) 108 (90 Base) MCG/ACT inhaler Inhale 2 puffs into the lungs every 6 (six) hours as needed for wheezing or shortness of breath.  Marland Kitchen apixaban (ELIQUIS) 2.5 MG TABS tablet Take 1 tablet (2.5 mg total) by mouth 2 (two) times daily.  Marland Kitchen azaTHIOprine (IMURAN) 50 MG tablet Take 1 tablet (50 mg total) by mouth 2 (two) times daily.  . bisacodyl (DULCOLAX) 5 MG EC tablet Take 1 tablet (5 mg total) by mouth daily as needed for moderate constipation.  . budesonide-formoterol (SYMBICORT) 160-4.5 MCG/ACT inhaler Inhale 2 puffs into the lungs in the morning and at bedtime.  . Cholecalciferol (VITAMIN D) 50 MCG (2000 UT) tablet Take 1 tablet (2,000 Units total) by mouth every evening.   . cloNIDine (CATAPRES) 0.1 MG tablet Take 0.1 mg by mouth 2 (two) times daily.  . cycloSPORINE modified (NEORAL) 100 MG capsule Take 1 capsule (100 mg total) by mouth 2 (two) times daily.  Marland Kitchen docusate sodium (COLACE) 100 MG capsule Take 1 capsule (100 mg total) by mouth 2 (two) times daily.  . famotidine (PEPCID) 20 MG tablet Take 1 tablet (20 mg total) by mouth daily with supper.  . ferrous sulfate 325 (65 FE) MG tablet Take 1 tablet (325 mg total) by mouth daily with breakfast.  . FLUoxetine (PROZAC) 20 MG tablet Take 1 tablet (20 mg total) by mouth daily.  . fluticasone (FLONASE) 50 MCG/ACT nasal spray Place 1 spray into both nostrils daily as needed for allergies or rhinitis.  . Glucerna (GLUCERNA) LIQD Take 237 mLs by mouth 2 (two) times daily.  Marland Kitchen levothyroxine (SYNTHROID) 75 MCG tablet Take 1 tablet (75 mcg total) by mouth daily.  Marland Kitchen loratadine (CLARITIN) 10 MG tablet Take 10 mg by mouth daily as needed for allergies.  . Lutein-Zeaxanthin 25-5 MG CAPS Take 1 capsule by mouth daily.  Marland Kitchen Lysine 500 MG TABS Take 500 mg by mouth daily.  . metoCLOPramide (REGLAN) 5 MG tablet Take 1 tablet (5 mg total) by mouth 4 (four) times daily.  . mirabegron ER (MYRBETRIQ) 50 MG TB24 tablet Take 1 tablet (50 mg total) by mouth every evening.  . mirtazapine (REMERON) 15 MG tablet Take 0.5 tablets (7.5 mg total) by mouth at bedtime.  . NON FORMULARY Magic cup twice daily to promote weight gain  . omeprazole (PRILOSEC) 20 MG capsule Take 1 capsule (20 mg total) by mouth daily.  . ondansetron (ZOFRAN) 4 MG tablet Take 1 tablet (4 mg total) by mouth every 4 (four) hours as needed.  . phenylephrine-shark liver oil-mineral oil-petrolatum (PREPARATION H) 0.25-3-14-71.9 % rectal ointment Place 1 application rectally 2 (two) times daily as needed for hemorrhoids.  . polyethylene glycol (MIRALAX / GLYCOLAX) 17 g packet Take 17 g by mouth daily as needed for mild constipation.  . pravastatin (PRAVACHOL) 40 MG tablet Take  1 tablet (40 mg total) by mouth daily.  . predniSONE (DELTASONE) 5 MG tablet Take 1 tablet (5 mg total) by mouth daily with breakfast.  . sulfamethoxazole-trimethoprim (BACTRIM) 400-80 MG tablet Take 1 tablet by mouth every Monday, Wednesday, and Friday.   No facility-administered encounter medications on file as of 01/03/2021.   ROS  General: NAD EYES: No vision changes ENMT: No dysphagia no xerostomia Cardiovascular: No chest pain Pulmonary: No cough, SOB  Abdomen:no constipation or diarrhea GU: No dysuria or urinary frequency MSK:   ROM limitations, no falls reported Skin: No rashes or wounds Neurological: weakness Psych:  positive mood Heme/lymph/immuno: No bruises or abnormal bleeding   PHYSICAL EXAM  Constitutional: In no acute distress, hard of hearing Cardiovascular: regular rate and rhythm; trace edema in BLE Pulmonary: no cough, no increased work of breathing, normal respiratory effort Abdomen: Protuberant, soft, non tender, positive bowel sounds in all quadrants GU:  no suprapubic tenderness Eyes: Normal lids, no discharge, sclera anicteric ENMT: Moist mucous membranes Musculoskeletal: no oint deformities Skin: no rash to visible skin, dry skin,warm without cyanosis Psych: non-anxious affect Neurological: Weakness but otherwise non focal Heme/lymph/immuno: no bruises, no bleeding  Thank you for the opportunity to participate in the care of SARAIH GRIESEMER Please call our office at [phone] if we can be of additional assistance.  Note: Portions of this note were generated with [employer]. Dictation errors may occur despite best attempts at proofreading.  Teodoro Spray, NP

## 2021-01-09 ENCOUNTER — Encounter: Payer: Self-pay | Admitting: Adult Health

## 2021-01-09 ENCOUNTER — Non-Acute Institutional Stay (SKILLED_NURSING_FACILITY): Payer: Medicare PPO | Admitting: Adult Health

## 2021-01-09 DIAGNOSIS — F0390 Unspecified dementia without behavioral disturbance: Secondary | ICD-10-CM | POA: Diagnosis not present

## 2021-01-09 DIAGNOSIS — I825Y2 Chronic embolism and thrombosis of unspecified deep veins of left proximal lower extremity: Secondary | ICD-10-CM | POA: Diagnosis not present

## 2021-01-09 DIAGNOSIS — I10 Essential (primary) hypertension: Secondary | ICD-10-CM

## 2021-01-09 NOTE — Progress Notes (Signed)
Location:  [facility] Room Number: [id number] Place of Service:  SNF (31) Provider:  Durenda Age, [place], FNP-BC  Patient Care Team: [name], MD as PCP - General (Internal Medicine)  Extended Emergency Contact Information Primary Emergency Contact: Jakari, [place] Address: [address]          [place] Litter of [place] Phone: [phone] Mobile Phone: [phone] Relation: Spouse Secondary Emergency Contact: Franki Cabot, [place] of [place] Phone: [phone] Relation: Son  Code Status:  DNR  Goals of care: Advanced Directive information Advanced Directives 01/09/2021  Does Patient Have a Medical Advance Directive? Yes  Type of Advance Directive Out of facility DNR (pink MOST or yellow form)  Does patient want to make changes to medical advance directive? -  Copy of [place] in Chart? -  Would patient like information on creating a medical advance directive? -  Pre-existing out of facility DNR order (yellow form or pink MOST form) Yellow form placed in chart (order not valid for inpatient use)     Chief Complaint  Patient presents with  . Acute Visit    Short-term rehabilitation visit    HPI:  Pt is a 87 y.o. female seen today for short-term rehabilitation visit. She has a PMH of pre-diabetes, hypothyroidism, hypertension, CAD and mild COPD. She was seen in the room today. She verbalized that she is excited to go home. SBPs ranging from 156 to 182. She denies having headache nor dizziness. She takes Metoprolol tartrate 25 mg BID for hypertension. She is on Eliquis 2.5 mg BID for history of DVT. She is currently having PT and OT.   Past Medical History:  Diagnosis Date  . Abnormal gait   . Anxiety   . Arthritis   . Asthma   . COPD, mild ([place])   . Coronary artery disease    pt denies  . Depression   . Diverticulitis   . Dizziness   . DVT (deep venous  thrombosis) ([place])   . GERD (gastroesophageal reflux disease)   . Hypertension   . Hypothyroidism   . Multiple allergies   . Pneumonia   . Pre-diabetes   . Reflux   . Renal disorder    had kidney transplant in 2009   Past Surgical History:  Procedure Laterality Date  . ANTERIOR APPROACH HEMI HIP ARTHROPLASTY Right 02/10/2016   Procedure: TOTAL HIP ARTHROPLASTY ANTERIOR APPROACH;  Surgeon: Rod Can, MD;  Location: [facility];  Service: Orthopedics;  Laterality: Right;  . CHOLECYSTECTOMY N/A 07/01/2018   Procedure: LAPAROSCOPIC CHOLECYSTECTOMY WITH INTRAOPERATIVE CHOLANGIOGRAM;  Surgeon: Jovita Kussmaul, MD;  Location: [facility];  Service: General;  Laterality: N/A;  . COLONOSCOPY    . NEPHRECTOMY TRANSPLANTED ORGAN    . TUMOR REMOVAL     Brain     Allergies  Allergen Reactions  . Macrolides And Ketolides Nausea Only  . Aricept [Donepezil Hcl] Nausea And Vomiting  . Codeine Nausea And Vomiting  . Morphine Nausea And Vomiting  . Penicillin G Nausea Only  . Penicillins Nausea And Vomiting and Other (See Comments)    HEADACHE Has patient had a PCN reaction causing immediate rash, facial/tongue/throat swelling, SOB or lightheadedness with hypotension: unkn Has patient had a PCN reaction causing severe rash involving mucus membranes or skin necrosis: unkn Has patient had a PCN reaction that required hospitalization: unkn Has patient had a PCN reaction occurring within the  last 10 years: unkn If all of the above answers are "NO", then may proceed with Cephalosporin use.   . Statins Other (See Comments)    Doesn't remember  . Streptomycin Other (See Comments)    headache   . Tetracycline Nausea Only  . Tetracyclines & Related Rash    Outpatient Encounter Medications as of 01/09/2021  Medication Sig  . acetaminophen (TYLENOL) 325 MG tablet Take 2 tablets (650 mg total) by mouth every 6 (six) hours as needed.  Marland Kitchen albuterol (VENTOLIN HFA) 108 (90 Base) MCG/ACT inhaler Inhale 2 puffs into  the lungs every 6 (six) hours as needed for wheezing or shortness of breath.  Marland Kitchen apixaban (ELIQUIS) 2.5 MG TABS tablet Take 1 tablet (2.5 mg total) by mouth 2 (two) times daily.  Marland Kitchen azaTHIOprine (IMURAN) 50 MG tablet Take 1 tablet (50 mg total) by mouth 2 (two) times daily.  . bisacodyl (DULCOLAX) 5 MG EC tablet Take 1 tablet (5 mg total) by mouth daily as needed for moderate constipation.  . budesonide-formoterol (SYMBICORT) 160-4.5 MCG/ACT inhaler Inhale 2 puffs into the lungs in the morning and at bedtime.  . Cholecalciferol (VITAMIN D) 50 MCG (2000 UT) tablet Take 1 tablet (2,000 Units total) by mouth every evening.  . cycloSPORINE modified (NEORAL) 100 MG capsule Take 1 capsule (100 mg total) by mouth 2 (two) times daily.  Marland Kitchen docusate sodium (COLACE) 100 MG capsule Take 1 capsule (100 mg total) by mouth 2 (two) times daily.  . famotidine (PEPCID) 20 MG tablet Take 1 tablet (20 mg total) by mouth daily with supper.  . ferrous sulfate 325 (65 FE) MG tablet Take 1 tablet (325 mg total) by mouth daily with breakfast.  . FLUoxetine (PROZAC) 20 MG tablet Take 1 tablet (20 mg total) by mouth daily.  . fluticasone (FLONASE) 50 MCG/ACT nasal spray Place 1 spray into both nostrils daily as needed for allergies or rhinitis.  . Glucerna (GLUCERNA) LIQD Take 237 mLs by mouth 2 (two) times daily.  Marland Kitchen levothyroxine (SYNTHROID) 75 MCG tablet Take 1 tablet (75 mcg total) by mouth daily.  Marland Kitchen loratadine (CLARITIN) 10 MG tablet Take 10 mg by mouth daily as needed for allergies.  . Lysine 500 MG TABS Take 500 mg by mouth daily.  . magnesium hydroxide (MILK OF MAGNESIA) 400 MG/5ML suspension Take by mouth daily as needed for mild constipation.  . metoCLOPramide (REGLAN) 5 MG tablet Take 1 tablet (5 mg total) by mouth 4 (four) times daily.  . metoprolol tartrate (LOPRESSOR) 25 MG tablet Take 25 mg by mouth 2 (two) times daily.  . mirabegron ER (MYRBETRIQ) 50 MG TB24 tablet Take 1 tablet (50 mg total) by mouth every  evening.  . mirtazapine (REMERON) 15 MG tablet Take 0.5 tablets (7.5 mg total) by mouth at bedtime.  . Multiple Vitamins-Minerals (PRESERVISION/LUTEIN PO) Take 1 capsule by mouth daily.  . NON FORMULARY Magic cup twice daily to promote weight gain  . omeprazole (PRILOSEC) 20 MG capsule Take 1 capsule (20 mg total) by mouth daily.  . ondansetron (ZOFRAN) 4 MG tablet Take 1 tablet (4 mg total) by mouth every 4 (four) hours as needed.  . phenylephrine-shark liver oil-mineral oil-petrolatum (PREPARATION H) 0.25-3-14-71.9 % rectal ointment Place 1 application rectally 2 (two) times daily as needed for hemorrhoids.  . polyethylene glycol (MIRALAX / GLYCOLAX) 17 g packet Take 17 g by mouth daily as needed for mild constipation.  . pravastatin (PRAVACHOL) 40 MG tablet Take 1 tablet (40 mg total) by  mouth daily.  . predniSONE (DELTASONE) 5 MG tablet Take 1 tablet (5 mg total) by mouth daily with breakfast.  . sulfamethoxazole-trimethoprim (BACTRIM) 400-80 MG tablet Take 1 tablet by mouth every Monday, Wednesday, and Friday.  . [DISCONTINUED] cloNIDine (CATAPRES) 0.1 MG tablet Take 0.1 mg by mouth 2 (two) times daily.  . [DISCONTINUED] Lutein-Zeaxanthin 25-5 MG CAPS Take 1 capsule by mouth daily.   No facility-administered encounter medications on file as of 01/09/2021.    Review of Systems  GENERAL: No fever or chills  MOUTH and THROAT: Denies oral discomfort, gingival pain or bleeding RESPIRATORY: no cough, SOB, DOE, wheezing, hemoptysis CARDIAC: No chest pain, edema or palpitations GI: No abdominal pain, diarrhea, constipation, heart burn, nausea or vomiting GU: Denies dysuria, frequency, hematuria or discharge NEUROLOGICAL: Denies dizziness, syncope, numbness, or headache PSYCHIATRIC: Denies feelings of depression or anxiety. No report of hallucinations, insomnia, paranoia, or agitation    Immunization History  Administered Date(s) Administered  . Influenza Split 07/26/2010, 08/02/2011,  09/01/2012, 07/29/2013, 07/29/2014, 08/29/2015  . Influenza, High Dose Seasonal PF 08/09/2016, 07/22/2017, 06/27/2018, 07/18/2019, 08/15/2020  . Influenza,inj,Quad PF,6+ Mos 07/28/2013  . Influenza-Unspecified 06/15/2020  . Moderna Sars-Covid-2 Vaccination 11/28/2019, 12/21/2019, 01/11/2020, 09/16/2020  . PPD Test 02/14/2016  . Pneumococcal Conjugate-13 06/29/2015  . Pneumococcal Polysaccharide-23 07/18/2005, 08/31/2016  . Td 01/24/1989  . Tdap 06/27/2012  . Unspecified SARS-COV-2 Vaccination 01/11/2020  . Zoster 10/14/2019, 03/23/2020   Pertinent  Health Maintenance Due  Topic Date Due  . FOOT EXAM  Never done  . OPHTHALMOLOGY EXAM  Never done  . URINE MICROALBUMIN  Never done  . HEMOGLOBIN A1C  12/30/2018  . INFLUENZA VACCINE  Completed  . DEXA SCAN  Completed  . PNA vac Low Risk Adult  Completed   No flowsheet data found.   Vitals:   01/09/21 1333  BP: (!) 163/74  Pulse: 73  Resp: 18  Temp: (!) 97.4 F (36.3 C)  Weight: 109 lb 6.4 oz (49.6 kg)  Height: '5\' 1"'$  (1.549 m)   Body mass index is 20.67 kg/m.  Physical Exam  GENERAL APPEARANCE: Well nourished. In no acute distress. Normal body habitus SKIN:  Skin is warm and dry.  MOUTH and THROAT: Lips are without lesions. Oral mucosa is moist and without lesions. Tongue is normal in shape, size, and color and without lesions RESPIRATORY: Breathing is even & unlabored, BS CTAB CARDIAC: RRR, no murmur,no extra heart sounds, no edema GI: Abdomen soft, normal BS, no masses, no tenderness  NEUROLOGICAL: There is no tremor. Speech is clear. Alert to self and place, disoriented to time. PSYCHIATRIC:  Affect and behavior are appropriate  Labs reviewed: Recent Labs    12/20/20 0721 12/21/20 0110 12/22/20 0225  NA 143 141 141  K 3.7 2.8* 3.3*  CL 114* 115* 113*  CO2 19* 17* 19*  GLUCOSE 136* 154* 255*  BUN 46* 50* 54*  CREATININE 1.87* 1.62* 1.92*  CALCIUM 9.5 9.1 9.3  MG 2.1 2.0 2.1  PHOS  --   --  2.1*   Recent  Labs    12/20/20 0721 12/21/20 0110 12/22/20 0225  AST '18 17 17  '$ ALT '9 9 8  '$ ALKPHOS 61 49 56  BILITOT 0.8 0.5 0.5  PROT 7.1 5.8* 5.4*  ALBUMIN 2.7* 2.2* 2.1*   Recent Labs    04/11/20 1033 04/12/20 0255 12/20/20 1657 12/21/20 0110 12/22/20 0225  WBC 10.7*   < > 5.9 5.8 5.1  NEUTROABS 7.8*  --   --  4.6 3.8  HGB 12.2   < > 10.4* 10.5* 9.8*  HCT 37.1   < > 31.2* 30.1* 29.5*  MCV 97.6   < > 95.1 93.5 97.0  PLT 224   < > 179 180 182   < > = values in this interval not displayed.   Lab Results  Component Value Date   TSH 0.587 12/19/2020   Lab Results  Component Value Date   HGBA1C 5.1 07/01/2018    Significant Diagnostic Results in last 30 days:  DG Chest Portable 1 View  Result Date: 12/19/2020 CLINICAL DATA:  Altered mental status EXAM: PORTABLE CHEST 1 VIEW COMPARISON:  04/11/2020 FINDINGS: Hyperinflation. No new consolidation or edema. Left lung calcified granuloma is again identified. No pleural effusion. Stable cardiomediastinal contours with normal heart size. IMPRESSION: No acute process in the chest. Electronically Signed   By: Macy Mis M.D.   On: 12/19/2020 14:52    Assessment/Plan  1. Uncontrolled hypertension -   BPs elevated, will start on Amlodipine 5 mg daily and continue Metoprolol tartrate 25 mg BID -   Monitor BPs  2. Chronic deep vein thrombosis (DVT) of proximal vein of left lower extremity (HCC) -  Continue Eliquis  3. Dementia without behavioral disturbance, unspecified dementia type ([place]) -   BIMS score 1/15, ranging in severe cognitive impairment      Family/ staff Communication:  Discussed plan of care with resident and charge nurse.  Labs/tests ordered:  None  Goals of care:   Short-term care   Durenda Age, DNP, MSN, FNP-BC [facility] [phone] (Monday-Friday 8:00 a.m. - 5:00 p.m.) [phone] (after hours)

## 2021-01-10 ENCOUNTER — Non-Acute Institutional Stay (SKILLED_NURSING_FACILITY): Payer: Medicare PPO | Admitting: Adult Health

## 2021-01-10 ENCOUNTER — Encounter: Payer: Self-pay | Admitting: Adult Health

## 2021-01-10 DIAGNOSIS — E034 Atrophy of thyroid (acquired): Secondary | ICD-10-CM

## 2021-01-10 DIAGNOSIS — K219 Gastro-esophageal reflux disease without esophagitis: Secondary | ICD-10-CM | POA: Diagnosis not present

## 2021-01-10 DIAGNOSIS — Z940 Kidney transplant status: Secondary | ICD-10-CM

## 2021-01-10 DIAGNOSIS — F32A Depression, unspecified: Secondary | ICD-10-CM

## 2021-01-10 DIAGNOSIS — N184 Chronic kidney disease, stage 4 (severe): Secondary | ICD-10-CM

## 2021-01-10 DIAGNOSIS — R627 Adult failure to thrive: Secondary | ICD-10-CM | POA: Diagnosis not present

## 2021-01-10 DIAGNOSIS — J42 Unspecified chronic bronchitis: Secondary | ICD-10-CM | POA: Diagnosis not present

## 2021-01-10 DIAGNOSIS — M353 Polymyalgia rheumatica: Secondary | ICD-10-CM

## 2021-01-10 DIAGNOSIS — I825Y2 Chronic embolism and thrombosis of unspecified deep veins of left proximal lower extremity: Secondary | ICD-10-CM

## 2021-01-10 DIAGNOSIS — K5901 Slow transit constipation: Secondary | ICD-10-CM | POA: Diagnosis not present

## 2021-01-10 DIAGNOSIS — I10 Essential (primary) hypertension: Secondary | ICD-10-CM

## 2021-01-10 DIAGNOSIS — E782 Mixed hyperlipidemia: Secondary | ICD-10-CM

## 2021-01-10 DIAGNOSIS — D631 Anemia in chronic kidney disease: Secondary | ICD-10-CM

## 2021-01-10 DIAGNOSIS — R634 Abnormal weight loss: Secondary | ICD-10-CM

## 2021-01-10 NOTE — Progress Notes (Signed)
Location:  [place] Room Number: [place] of Service:  SNF (31) Provider:  Durenda Age, DNP, FNP-BC  Patient Care Team: [name], MD as PCP - General (Internal Medicine)  Extended Emergency Contact Information Primary Emergency Contact: Riddhima, [place] Address: [place]          [place] of [place] Phone: [phone] Mobile Phone: [phone] Relation: Spouse Secondary Emergency Contact: Franki Cabot, [place] of [place] Mobile Phone: [phone] Relation: Son  Code Status:  DNR  Goals of care: Advanced Directive information Advanced Directives 01/10/2021  Does Patient Have a Medical Advance Directive? Yes  Type of Advance Directive -  Does patient want to make changes to medical advance directive? No - Guardian declined  Copy of [place] in Chart? -  Would patient like information on creating a medical advance directive? -  Pre-existing out of facility DNR order (yellow form or pink MOST form) Yellow form placed in chart (order not valid for inpatient use)     Chief Complaint  Patient presents with  . Discharge Note    Discharge from SNF    HPI:  Pt is a 87 y.o. [name] who is for discharge home on 01/12/21 with Home health PT, OT and Aide.  She was admitted to [facility] on 12/22/20 post [facility] admission 12/19/20 to 12/22/2020.  She has a PMH of prediabetes, hypothyroidism, hypertension, CAD and mild COPD.  Husband stated that she was cognitively intact and able to mobilize 3 weeks prior to hospitalization.  She refused to get out of bed and was essentially nonverbal.  She has been refusing all food and drinks very little.  She was incidentally found to have COVID-19 infection.  Chest x-ray was negative for pneumonia, CRP mildly elevated.  She was treated with 3 days of IV remdesivir and IV steroids.  Palliative was consulted for  failure to thrive due to progressive dementia.  Patient was admitted to this facility for short-term rehabilitation after the patient's recent hospitalization.  Patient has completed SNF rehabilitation and therapy has cleared the patient for discharge.   Past Medical History:  Diagnosis Date  . Abnormal gait   . Anxiety   . Arthritis   . Asthma   . COPD, mild ([place])   . Coronary artery disease    pt denies  . Depression   . Diverticulitis   . Dizziness   . DVT (deep venous thrombosis) ([place])   . GERD (gastroesophageal reflux disease)   . Hypertension   . Hypothyroidism   . Multiple allergies   . Pneumonia   . Pre-diabetes   . Reflux   . Renal disorder    had kidney transplant in 2009   Past Surgical History:  Procedure Laterality Date  . ANTERIOR APPROACH HEMI HIP ARTHROPLASTY Right 02/10/2016   Procedure: TOTAL HIP ARTHROPLASTY ANTERIOR APPROACH;  Surgeon: Rod Can, MD;  Location: [facility];  Service: Orthopedics;  Laterality: Right;  . CHOLECYSTECTOMY N/A 07/01/2018   Procedure: LAPAROSCOPIC CHOLECYSTECTOMY WITH INTRAOPERATIVE CHOLANGIOGRAM;  Surgeon: Jovita Kussmaul, MD;  Location: [facility];  Service: General;  Laterality: N/A;  . COLONOSCOPY    . NEPHRECTOMY TRANSPLANTED ORGAN    . TUMOR REMOVAL     Brain     Allergies  Allergen Reactions  . Macrolides And Ketolides Nausea Only  . Aricept [Donepezil Hcl] Nausea And Vomiting  . Codeine Nausea And  Vomiting  . Morphine Nausea And Vomiting  . Penicillin G Nausea Only  . Penicillins Nausea And Vomiting and Other (See Comments)    HEADACHE Has patient had a PCN reaction causing immediate rash, facial/tongue/throat swelling, SOB or lightheadedness with hypotension: unkn Has patient had a PCN reaction causing severe rash involving mucus membranes or skin necrosis: unkn Has patient had a PCN reaction that required hospitalization: unkn Has patient had a PCN reaction occurring within the last 10 years: unkn If all of the  above answers are "NO", then may proceed with Cephalosporin use.   . Statins Other (See Comments)    Doesn't remember  . Streptomycin Other (See Comments)    headache   . Tetracycline Nausea Only  . Tetracyclines & Related Rash    Outpatient Encounter Medications as of 01/10/2021  Medication Sig  . acetaminophen (TYLENOL) 325 MG tablet Take 2 tablets (650 mg total) by mouth every 6 (six) hours as needed.  Marland Kitchen amLODipine (NORVASC) 5 MG tablet Take 1 tablet (5 mg total) by mouth daily.  . magnesium hydroxide (MILK OF MAGNESIA) 400 MG/5ML suspension Take by mouth daily as needed for mild constipation.  . Multiple Vitamins-Minerals (PRESERVISION/LUTEIN PO) Take 1 capsule by mouth daily.  . NON FORMULARY Magic cup twice daily to promote weight gain  . ondansetron (ZOFRAN) 4 MG tablet Take 1 tablet (4 mg total) by mouth every 4 (four) hours as needed.  . phenylephrine-shark liver oil-mineral oil-petrolatum (PREPARATION H) 0.25-3-14-71.9 % rectal ointment Place 1 application rectally 2 (two) times daily as needed for hemorrhoids.  . polyethylene glycol (MIRALAX / GLYCOLAX) 17 g packet Take 17 g by mouth daily as needed for mild constipation.  . [DISCONTINUED] mirtazapine (REMERON) 15 MG tablet Take 0.5 tablets (7.5 mg total) by mouth at bedtime.  . [DISCONTINUED] omeprazole (PRILOSEC) Patricia MG capsule Take 1 capsule (Patricia mg total) by mouth daily.  Marland Kitchen albuterol (VENTOLIN HFA) 108 (90 Base) MCG/ACT inhaler Inhale 2 puffs into the lungs every 6 (six) hours as needed for wheezing or shortness of breath.  Marland Kitchen apixaban (ELIQUIS) 2.5 MG TABS tablet Take 1 tablet (2.5 mg total) by mouth 2 (two) times daily.  Marland Kitchen azaTHIOprine (IMURAN) 50 MG tablet Take 1 tablet (50 mg total) by mouth 2 (two) times daily.  . bisacodyl (DULCOLAX) 5 MG EC tablet Take 1 tablet (5 mg total) by mouth daily as needed for moderate constipation.  . budesonide-formoterol (SYMBICORT) 160-4.5 MCG/ACT inhaler Inhale 2 puffs into the lungs in the  morning and at bedtime.  . Cholecalciferol (VITAMIN D) 50 MCG (2000 UT) tablet Take 1 tablet (2,000 Units total) by mouth every evening.  . cycloSPORINE modified (NEORAL) 100 MG capsule Take 1 capsule (100 mg total) by mouth 2 (two) times daily.  Marland Kitchen docusate sodium (COLACE) 100 MG capsule Take 1 capsule (100 mg total) by mouth 2 (two) times daily.  . famotidine (PEPCID) Patricia MG tablet Take 1 tablet (Patricia mg total) by mouth daily with supper.  . ferrous sulfate 325 (65 FE) MG tablet Take 1 tablet (325 mg total) by mouth daily with breakfast.  . FLUoxetine (PROZAC) Patricia MG tablet Take 1 tablet (Patricia mg total) by mouth daily.  . fluticasone (FLONASE) 50 MCG/ACT nasal spray Place 1 spray into both nostrils daily as needed for allergies or rhinitis.  . Glucerna (GLUCERNA) LIQD Take 237 mLs by mouth 2 (two) times daily.  . insulin lispro (HUMALOG) 100 UNIT/ML KwikPen Inject 8 Units into the skin daily.  Marland Kitchen  levothyroxine (SYNTHROID) 75 MCG tablet Take 1 tablet (75 mcg total) by mouth daily.  Marland Kitchen loratadine (CLARITIN) 10 MG tablet Take 1 tablet (10 mg total) by mouth daily as needed for allergies.  . Lysine 500 MG TABS Take 1 tablet (500 mg total) by mouth daily.  . metoCLOPramide (REGLAN) 5 MG tablet Take 1 tablet (5 mg total) by mouth 4 (four) times daily.  . metoprolol tartrate (LOPRESSOR) 25 MG tablet Take 1 tablet (25 mg total) by mouth 2 (two) times daily.  . mirabegron ER (MYRBETRIQ) 50 MG TB24 tablet Take 1 tablet (50 mg total) by mouth every evening.  . mirtazapine (REMERON) 15 MG tablet Take 0.5 tablets (7.5 mg total) by mouth at bedtime.  Marland Kitchen omeprazole (PRILOSEC) Patricia MG capsule Take 1 capsule (Patricia mg total) by mouth daily.  . pravastatin (PRAVACHOL) 40 MG tablet Take 1 tablet (40 mg total) by mouth daily.  . predniSONE (DELTASONE) 5 MG tablet Take 1 tablet (5 mg total) by mouth daily with breakfast.  . sulfamethoxazole-trimethoprim (BACTRIM) 400-80 MG tablet Take 1 tablet by mouth every Monday, Wednesday,  and Friday.  . [DISCONTINUED] albuterol (VENTOLIN HFA) 108 (90 Base) MCG/ACT inhaler Inhale 2 puffs into the lungs every 6 (six) hours as needed for wheezing or shortness of breath.  . [DISCONTINUED] AMLODIPINE BESYLATE PO Take 5 mg by mouth daily.  . [DISCONTINUED] apixaban (ELIQUIS) 2.5 MG TABS tablet Take 1 tablet (2.5 mg total) by mouth 2 (two) times daily.  . [DISCONTINUED] azaTHIOprine (IMURAN) 50 MG tablet Take 1 tablet (50 mg total) by mouth 2 (two) times daily.  . [DISCONTINUED] bisacodyl (DULCOLAX) 5 MG EC tablet Take 1 tablet (5 mg total) by mouth daily as needed for moderate constipation.  . [DISCONTINUED] budesonide-formoterol (SYMBICORT) 160-4.5 MCG/ACT inhaler Inhale 2 puffs into the lungs in the morning and at bedtime.  . [DISCONTINUED] Cholecalciferol (VITAMIN D) 50 MCG (2000 UT) tablet Take 1 tablet (2,000 Units total) by mouth every evening.  . [DISCONTINUED] cycloSPORINE modified (NEORAL) 100 MG capsule Take 1 capsule (100 mg total) by mouth 2 (two) times daily.  . [DISCONTINUED] docusate sodium (COLACE) 100 MG capsule Take 1 capsule (100 mg total) by mouth 2 (two) times daily.  . [DISCONTINUED] famotidine (PEPCID) Patricia MG tablet Take 1 tablet (Patricia mg total) by mouth daily with supper.  . [DISCONTINUED] ferrous sulfate 325 (65 FE) MG tablet Take 1 tablet (325 mg total) by mouth daily with breakfast.  . [DISCONTINUED] FLUoxetine (PROZAC) Patricia MG tablet Take 1 tablet (Patricia mg total) by mouth daily.  . [DISCONTINUED] fluticasone (FLONASE) 50 MCG/ACT nasal spray Place 1 spray into both nostrils daily as needed for allergies or rhinitis.  . [DISCONTINUED] Glucerna (GLUCERNA) LIQD Take 237 mLs by mouth 2 (two) times daily.  . [DISCONTINUED] insulin lispro (HUMALOG) 100 UNIT/ML KwikPen Inject 8 Units into the skin daily.  . [DISCONTINUED] levothyroxine (SYNTHROID) 75 MCG tablet Take 1 tablet (75 mcg total) by mouth daily.  . [DISCONTINUED] loratadine (CLARITIN) 10 MG tablet Take 10 mg by mouth  daily as needed for allergies.  . [DISCONTINUED] Lysine 500 MG TABS Take 500 mg by mouth daily.  . [DISCONTINUED] metoCLOPramide (REGLAN) 5 MG tablet Take 1 tablet (5 mg total) by mouth 4 (four) times daily.  . [DISCONTINUED] metoprolol tartrate (LOPRESSOR) 25 MG tablet Take 25 mg by mouth 2 (two) times daily.  . [DISCONTINUED] mirabegron ER (MYRBETRIQ) 50 MG TB24 tablet Take 1 tablet (50 mg total) by mouth every evening.  . [DISCONTINUED]  pravastatin (PRAVACHOL) 40 MG tablet Take 1 tablet (40 mg total) by mouth daily.  . [DISCONTINUED] predniSONE (DELTASONE) 5 MG tablet Take 1 tablet (5 mg total) by mouth daily with breakfast.  . [DISCONTINUED] sulfamethoxazole-trimethoprim (BACTRIM) 400-80 MG tablet Take 1 tablet by mouth every Monday, Wednesday, and Friday.   No facility-administered encounter medications on file as of 01/10/2021.    Review of Systems  GENERAL: No change in appetite, no fatigue, no weight changes, no fever, chills or weakness MOUTH and THROAT: Denies oral discomfort, gingival pain or bleeding RESPIRATORY: no cough, SOB, DOE, wheezing, hemoptysis CARDIAC: No chest pain, edema or palpitations GI: No abdominal pain, diarrhea, constipation, heart burn, nausea or vomiting GU: Denies dysuria, frequency, hematuria or discharge NEUROLOGICAL: Denies dizziness, syncope, numbness, or headache PSYCHIATRIC: Denies feelings of depression or anxiety. No report of hallucinations, insomnia, paranoia, or agitation   Immunization History  Administered Date(s) Administered  . Influenza Split 07/26/2010, 08/02/2011, 09/01/2012, 07/29/2013, 07/29/2014, 08/29/2015  . Influenza, High Dose Seasonal PF 08/09/2016, 07/22/2017, 06/27/2018, 07/18/2019, 08/15/2020  . Influenza,inj,Quad PF,6+ Mos 07/28/2013  . Influenza-Unspecified 06/15/2020  . Moderna Sars-Covid-2 Vaccination 11/28/2019, 12/21/2019, 01/11/2020, 09/16/2020  . PPD Test 02/14/2016  . Pneumococcal Conjugate-13 06/29/2015  .  Pneumococcal Polysaccharide-23 07/18/2005, 08/31/2016  . Td 01/24/1989  . Tdap 06/27/2012  . Unspecified SARS-COV-2 Vaccination 01/11/2020  . Zoster 10/14/2019, 03/23/2020   Pertinent  Health Maintenance Due  Topic Date Due  . FOOT EXAM  Never done  . OPHTHALMOLOGY EXAM  Never done  . URINE MICROALBUMIN  Never done  . HEMOGLOBIN A1C  12/30/2018  . INFLUENZA VACCINE  05/15/2021  . DEXA SCAN  Completed  . PNA vac Low Risk Adult  Completed   No flowsheet data found.   Vitals:   01/10/21 1631  BP: (!) 138/58  Pulse: 61  Temp: (!) 97 F (36.1 C)  Weight: 108 lb 6.4 oz (49.2 kg)  Height: '5\' 1"'$  (1.549 m)   Body mass index is Patricia.48 kg/m.  Physical Exam  GENERAL APPEARANCE: Well nourished. In no acute distress. Normal body habitus SKIN:  Skin is warm and dry.  MOUTH and THROAT: Lips are without lesions. Oral mucosa is moist and without lesions.  RESPIRATORY: Breathing is even & unlabored, BS CTAB CARDIAC: RRR, no murmur,no extra heart sounds, no edema GI: Abdomen soft, normal BS, no masses, no tenderness EXTREMITIES: Able to move x4 extremities NEUROLOGICAL: There is no tremor. Speech is clear.  Alert to self and place, disoriented to time. PSYCHIATRIC:  Affect and behavior are appropriate  Labs reviewed: Recent Labs    12/20/20 0721 12/21/20 0110 12/22/20 0225  NA 143 141 141  K 3.7 2.8* 3.3*  CL 114* 115* 113*  CO2 19* 17* 19*  GLUCOSE 136* 154* 255*  BUN 46* 50* 54*  CREATININE 1.87* 1.62* 1.92*  CALCIUM 9.5 9.1 9.3  MG 2.1 2.0 2.1  PHOS  --   --  2.1*   Recent Labs    12/20/20 0721 12/21/20 0110 12/22/20 0225  AST '18 17 17  '$ ALT '9 9 8  '$ ALKPHOS 61 49 56  BILITOT 0.8 0.5 0.5  PROT 7.1 5.8* 5.4*  ALBUMIN 2.7* 2.2* 2.1*   Recent Labs    04/11/20 1033 04/12/20 0255 12/20/20 1657 12/21/20 0110 12/22/20 0225  WBC 10.7*   < > 5.9 5.8 5.1  NEUTROABS 7.8*  --   --  4.6 3.8  HGB 12.2   < > 10.4* 10.5* 9.8*  HCT 37.1   < >  31.2* 30.1* 29.5*  MCV 97.6    < > 95.1 93.5 97.0  PLT 224   < > 179 180 182   < > = values in this interval not displayed.   Lab Results  Component Value Date   TSH 0.587 12/19/2020   Lab Results  Component Value Date   HGBA1C 5.1 07/01/2018    Significant Diagnostic Results in last 30 days:  DG Chest Portable 1 View  Result Date: 12/19/2020 CLINICAL DATA:  Altered mental status EXAM: PORTABLE CHEST 1 VIEW COMPARISON:  04/11/2020 FINDINGS: Hyperinflation. No new consolidation or edema. Left lung calcified granuloma is again identified. No pleural effusion. Stable cardiomediastinal contours with normal heart size. IMPRESSION: No acute process in the chest. Electronically Signed   By: Macy Mis M.D.   On: 12/19/2020 14:52    Assessment/Plan  1. Chronic bronchitis, unspecified chronic bronchitis type (HCC) - albuterol (VENTOLIN HFA) 108 (90 Base) MCG/ACT inhaler; Inhale 2 puffs into the lungs every 6 (six) hours as needed for wheezing or shortness of breath.  Dispense: 6.7 g; Refill: 0 - budesonide-formoterol (SYMBICORT) 160-4.5 MCG/ACT inhaler; Inhale 2 puffs into the lungs in the morning and at bedtime.  Dispense: 1 each; Refill: 0  2. Chronic deep vein thrombosis (DVT) of proximal vein of left lower extremity (HCC) - apixaban (ELIQUIS) 2.5 MG TABS tablet; Take 1 tablet (2.5 mg total) by mouth 2 (two) times daily.  Dispense: 60 tablet; Refill: 0  3. Hx of kidney transplant - azaTHIOprine (IMURAN) 50 MG tablet; Take 1 tablet (50 mg total) by mouth 2 (two) times daily.  Dispense: 60 tablet; Refill: 0 - cycloSPORINE modified (NEORAL) 100 MG capsule; Take 1 capsule (100 mg total) by mouth 2 (two) times daily.  Dispense: 60 capsule; Refill: 0 - sulfamethoxazole-trimethoprim (BACTRIM) 400-80 MG tablet; Take 1 tablet by mouth every Monday, Wednesday, and Friday.  Dispense: 12 tablet; Refill: 0 - predniSONE (DELTASONE) 5 MG tablet; Take 1 tablet (5 mg total) by mouth daily with breakfast.  Dispense: 30 tablet; Refill:  0  4. Slow transit constipation - docusate sodium (COLACE) 100 MG capsule; Take 1 capsule (100 mg total) by mouth 2 (two) times daily.  Dispense: 10 capsule; Refill: 0  5. Gastroesophageal reflux disease without esophagitis - famotidine (PEPCID) Patricia MG tablet; Take 1 tablet (Patricia mg total) by mouth daily with supper.  Dispense: 30 tablet; Refill: 0 - omeprazole (PRILOSEC) Patricia MG capsule; Take 1 capsule (Patricia mg total) by mouth daily.  Dispense: 30 capsule; Refill: 0  6. Anemia in stage 4 chronic kidney disease (HCC) Lab Results  Component Value Date   HGB 9.8 (L) 12/22/2020   - ferrous sulfate 325 (65 FE) MG tablet; Take 1 tablet (325 mg total) by mouth daily with breakfast.  Dispense: 30 tablet; Refill: 0  7. Depression, unspecified depression type - FLUoxetine (PROZAC) Patricia MG tablet; Take 1 tablet (Patricia mg total) by mouth daily.  Dispense: 30 tablet; Refill: 0  8. Failure to thrive in adult -   Gained 1.8 lbs in Patricia days - Glucerna (GLUCERNA) LIQD; Take 237 mLs by mouth 2 (two) times daily.  Dispense: 237 mL; Refill: 0 - mirtazapine (REMERON) 15 MG tablet; Take 0.5 tablets (7.5 mg total) by mouth at bedtime.  Dispense: 30 tablet; Refill: 0  9. Weight loss -   Gained 1.8 lbs in Patricia days - Glucerna (GLUCERNA) LIQD; Take 237 mLs by mouth 2 (two) times daily.  Dispense: 237 mL; Refill: 0 - mirtazapine (REMERON)  15 MG tablet; Take 0.5 tablets (7.5 mg total) by mouth at bedtime.  Dispense: 30 tablet; Refill: 0  10. Hypothyroidism due to acquired atrophy of thyroid Lab Results  Component Value Date   TSH 0.587 12/19/2020   - levothyroxine (SYNTHROID) 75 MCG tablet; Take 1 tablet (75 mcg total) by mouth daily.  Dispense: 30 tablet; Refill: 0  11. Mixed hyperlipidemia - pravastatin (PRAVACHOL) 40 MG tablet; Take 1 tablet (40 mg total) by mouth daily.  Dispense: 30 tablet; Refill: 0  12. Essential hypertension - amLODipine (NORVASC) 5 MG tablet; Take 1 tablet (5 mg total) by mouth daily.   Dispense: 30 tablet; Refill: 0      I have filled out patient's discharge paperwork and e-prescribed medications. Patient will have home health PT, OT and Aide.  DME provided:  None  Total discharge time: Greater than 30 minutes Greater than 50% was spent in counseling and coordination of care.   Discharge time involved coordination of the discharge process with social worker, nursing staff and therapy department. Medical justification for home health services verified.   Durenda Age, DNP, MSN, FNP-BC [facility] [phone] (Monday-Friday 8:00 a.m. - 5:00 p.m.) [phone] (after hours)

## 2021-01-10 NOTE — Progress Notes (Signed)
error 

## 2021-01-11 DIAGNOSIS — I129 Hypertensive chronic kidney disease with stage 1 through stage 4 chronic kidney disease, or unspecified chronic kidney disease: Secondary | ICD-10-CM | POA: Diagnosis not present

## 2021-01-11 DIAGNOSIS — F324 Major depressive disorder, single episode, in partial remission: Secondary | ICD-10-CM | POA: Diagnosis not present

## 2021-01-11 DIAGNOSIS — H35033 Hypertensive retinopathy, bilateral: Secondary | ICD-10-CM | POA: Diagnosis not present

## 2021-01-11 DIAGNOSIS — E7800 Pure hypercholesterolemia, unspecified: Secondary | ICD-10-CM | POA: Diagnosis not present

## 2021-01-11 DIAGNOSIS — E039 Hypothyroidism, unspecified: Secondary | ICD-10-CM | POA: Diagnosis not present

## 2021-01-11 DIAGNOSIS — N184 Chronic kidney disease, stage 4 (severe): Secondary | ICD-10-CM | POA: Diagnosis not present

## 2021-01-11 DIAGNOSIS — M810 Age-related osteoporosis without current pathological fracture: Secondary | ICD-10-CM | POA: Diagnosis not present

## 2021-01-11 DIAGNOSIS — J449 Chronic obstructive pulmonary disease, unspecified: Secondary | ICD-10-CM | POA: Diagnosis not present

## 2021-01-12 MED ORDER — METOPROLOL TARTRATE 25 MG PO TABS: 25.0000 mg | ORAL_TABLET | Freq: Two times a day (BID) | ORAL | 0 refills | Status: DC

## 2021-01-12 MED ORDER — PRAVASTATIN SODIUM 40 MG PO TABS
40.0000 mg | ORAL_TABLET | Freq: Every day | ORAL | 0 refills | Status: DC
Start: 2021-01-12 — End: 2024-01-10

## 2021-01-12 MED ORDER — OMEPRAZOLE 20 MG PO CPDR
20.0000 mg | DELAYED_RELEASE_CAPSULE | Freq: Every day | ORAL | 0 refills | Status: DC
Start: 2021-01-12 — End: 2024-01-10

## 2021-01-12 MED ORDER — LEVOTHYROXINE SODIUM 75 MCG PO TABS: 75.0000 ug | ORAL_TABLET | Freq: Every day | ORAL | 0 refills | Status: DC

## 2021-01-12 MED ORDER — INSULIN LISPRO (1 UNIT DIAL) 100 UNIT/ML (KWIKPEN): 8.0000 [IU] | PEN_INJECTOR | Freq: Every day | SUBCUTANEOUS | 0 refills | Status: DC

## 2021-01-12 MED ORDER — DOCUSATE SODIUM 100 MG PO CAPS: 100.0000 mg | ORAL_CAPSULE | Freq: Two times a day (BID) | ORAL | 0 refills | Status: DC

## 2021-01-12 MED ORDER — MIRABEGRON ER 50 MG PO TB24: 50.0000 mg | ORAL_TABLET | Freq: Every evening | ORAL | 0 refills | Status: DC

## 2021-01-12 MED ORDER — ALBUTEROL SULFATE HFA 108 (90 BASE) MCG/ACT IN AERS
2.0000 | INHALATION_SPRAY | Freq: Four times a day (QID) | RESPIRATORY_TRACT | 0 refills | Status: DC | PRN
Start: 2021-01-12 — End: 2024-01-10

## 2021-01-12 MED ORDER — LYSINE 500 MG PO TABS: 500.0000 mg | ORAL_TABLET | Freq: Every day | ORAL | 0 refills | Status: DC

## 2021-01-12 MED ORDER — APIXABAN 2.5 MG PO TABS: 2.5000 mg | ORAL_TABLET | Freq: Two times a day (BID) | ORAL | 0 refills | Status: DC

## 2021-01-12 MED ORDER — AMLODIPINE BESYLATE 5 MG PO TABS: 5.0000 mg | ORAL_TABLET | Freq: Every day | ORAL | 0 refills | Status: DC

## 2021-01-12 MED ORDER — FLUOXETINE HCL 20 MG PO TABS: 20.0000 mg | ORAL_TABLET | Freq: Every day | ORAL | 0 refills | Status: DC

## 2021-01-12 MED ORDER — SULFAMETHOXAZOLE-TRIMETHOPRIM 400-80 MG PO TABS: 1.0000 | ORAL_TABLET | ORAL | 0 refills | Status: DC

## 2021-01-12 MED ORDER — PREDNISONE 5 MG PO TABS: 5.0000 mg | ORAL_TABLET | Freq: Every day | ORAL | 0 refills | Status: DC

## 2021-01-12 MED ORDER — AZATHIOPRINE 50 MG PO TABS: 50.0000 mg | ORAL_TABLET | Freq: Two times a day (BID) | ORAL | 0 refills | Status: DC

## 2021-01-12 MED ORDER — CYCLOSPORINE MODIFIED (NEORAL) 100 MG PO CAPS
100.0000 mg | ORAL_CAPSULE | Freq: Two times a day (BID) | ORAL | 0 refills | Status: DC
Start: 2021-01-12 — End: 2024-01-10

## 2021-01-12 MED ORDER — VITAMIN D 50 MCG (2000 UT) PO TABS: 2000.0000 [IU] | ORAL_TABLET | Freq: Every evening | ORAL | 0 refills | Status: DC

## 2021-01-12 MED ORDER — LORATADINE 10 MG PO TABS: 10.0000 mg | ORAL_TABLET | Freq: Every day | ORAL | 0 refills | Status: DC | PRN

## 2021-01-12 MED ORDER — FERROUS SULFATE 325 (65 FE) MG PO TABS: 325.0000 mg | ORAL_TABLET | Freq: Every day | ORAL | 0 refills | Status: DC

## 2021-01-12 MED ORDER — METOCLOPRAMIDE HCL 5 MG PO TABS: 5.0000 mg | ORAL_TABLET | Freq: Four times a day (QID) | ORAL | 0 refills | Status: DC

## 2021-01-12 MED ORDER — MIRTAZAPINE 15 MG PO TABS: 7.5000 mg | ORAL_TABLET | Freq: Every day | ORAL | 0 refills | Status: DC

## 2021-01-12 MED ORDER — GLUCERNA PO LIQD: 237.0000 mL | Freq: Two times a day (BID) | ORAL | 0 refills | Status: DC

## 2021-01-12 MED ORDER — BUDESONIDE-FORMOTEROL FUMARATE 160-4.5 MCG/ACT IN AERO: 2.0000 | INHALATION_SPRAY | Freq: Two times a day (BID) | RESPIRATORY_TRACT | 0 refills | Status: DC

## 2021-01-12 MED ORDER — BISACODYL 5 MG PO TBEC: 5.0000 mg | DELAYED_RELEASE_TABLET | Freq: Every day | ORAL | 0 refills | Status: DC | PRN

## 2021-01-12 MED ORDER — FLUTICASONE PROPIONATE 50 MCG/ACT NA SUSP: 1.0000 | Freq: Every day | NASAL | 0 refills | Status: DC | PRN

## 2021-01-12 MED ORDER — FAMOTIDINE 20 MG PO TABS
20.0000 mg | ORAL_TABLET | Freq: Every day | ORAL | 0 refills | Status: DC
Start: 2021-01-12 — End: 2024-01-10

## 2021-01-13 ENCOUNTER — Encounter: Payer: Self-pay | Admitting: Adult Health

## 2021-01-14 DIAGNOSIS — F339 Major depressive disorder, recurrent, unspecified: Secondary | ICD-10-CM | POA: Diagnosis not present

## 2021-01-14 DIAGNOSIS — E039 Hypothyroidism, unspecified: Secondary | ICD-10-CM | POA: Diagnosis not present

## 2021-01-14 DIAGNOSIS — I82502 Chronic embolism and thrombosis of unspecified deep veins of left lower extremity: Secondary | ICD-10-CM | POA: Diagnosis not present

## 2021-01-14 DIAGNOSIS — U071 COVID-19: Secondary | ICD-10-CM | POA: Diagnosis not present

## 2021-01-14 DIAGNOSIS — F0280 Dementia in other diseases classified elsewhere without behavioral disturbance: Secondary | ICD-10-CM | POA: Diagnosis not present

## 2021-01-14 DIAGNOSIS — I130 Hypertensive heart and chronic kidney disease with heart failure and stage 1 through stage 4 chronic kidney disease, or unspecified chronic kidney disease: Secondary | ICD-10-CM | POA: Diagnosis not present

## 2021-01-14 DIAGNOSIS — I5032 Chronic diastolic (congestive) heart failure: Secondary | ICD-10-CM | POA: Diagnosis not present

## 2021-01-14 DIAGNOSIS — N184 Chronic kidney disease, stage 4 (severe): Secondary | ICD-10-CM | POA: Diagnosis not present

## 2021-01-14 DIAGNOSIS — E1122 Type 2 diabetes mellitus with diabetic chronic kidney disease: Secondary | ICD-10-CM | POA: Diagnosis not present

## 2021-01-16 ENCOUNTER — Emergency Department (HOSPITAL_COMMUNITY)
Admission: EM | Admit: 2021-01-16 | Discharge: 2021-01-16 | Disposition: A | Discharge status: Home / Self Care | Payer: Medicare PPO | Attending: Emergency Medicine | Admitting: Emergency Medicine

## 2021-01-16 ENCOUNTER — Other Ambulatory Visit: Payer: Self-pay

## 2021-01-16 ENCOUNTER — Encounter (HOSPITAL_COMMUNITY): Payer: Self-pay

## 2021-01-16 ENCOUNTER — Emergency Department (HOSPITAL_COMMUNITY): Payer: Medicare PPO

## 2021-01-16 DIAGNOSIS — I130 Hypertensive heart and chronic kidney disease with heart failure and stage 1 through stage 4 chronic kidney disease, or unspecified chronic kidney disease: Secondary | ICD-10-CM | POA: Insufficient documentation

## 2021-01-16 DIAGNOSIS — S72001A Fracture of unspecified part of neck of right femur, initial encounter for closed fracture: Secondary | ICD-10-CM | POA: Diagnosis not present

## 2021-01-16 DIAGNOSIS — S32401A Unspecified fracture of right acetabulum, initial encounter for closed fracture: Secondary | ICD-10-CM | POA: Diagnosis not present

## 2021-01-16 DIAGNOSIS — E1122 Type 2 diabetes mellitus with diabetic chronic kidney disease: Secondary | ICD-10-CM | POA: Insufficient documentation

## 2021-01-16 DIAGNOSIS — E039 Hypothyroidism, unspecified: Secondary | ICD-10-CM | POA: Diagnosis not present

## 2021-01-16 DIAGNOSIS — Z8616 Personal history of COVID-19: Secondary | ICD-10-CM | POA: Insufficient documentation

## 2021-01-16 DIAGNOSIS — Z79899 Other long term (current) drug therapy: Secondary | ICD-10-CM | POA: Insufficient documentation

## 2021-01-16 DIAGNOSIS — Z96641 Presence of right artificial hip joint: Secondary | ICD-10-CM | POA: Insufficient documentation

## 2021-01-16 DIAGNOSIS — R0902 Hypoxemia: Secondary | ICD-10-CM | POA: Diagnosis not present

## 2021-01-16 DIAGNOSIS — Z794 Long term (current) use of insulin: Secondary | ICD-10-CM | POA: Diagnosis not present

## 2021-01-16 DIAGNOSIS — Z7951 Long term (current) use of inhaled steroids: Secondary | ICD-10-CM | POA: Insufficient documentation

## 2021-01-16 DIAGNOSIS — N184 Chronic kidney disease, stage 4 (severe): Secondary | ICD-10-CM | POA: Diagnosis not present

## 2021-01-16 DIAGNOSIS — J45909 Unspecified asthma, uncomplicated: Secondary | ICD-10-CM | POA: Diagnosis not present

## 2021-01-16 DIAGNOSIS — J449 Chronic obstructive pulmonary disease, unspecified: Secondary | ICD-10-CM | POA: Insufficient documentation

## 2021-01-16 DIAGNOSIS — W19XXXA Unspecified fall, initial encounter: Secondary | ICD-10-CM | POA: Diagnosis not present

## 2021-01-16 DIAGNOSIS — I708 Atherosclerosis of other arteries: Secondary | ICD-10-CM | POA: Diagnosis not present

## 2021-01-16 DIAGNOSIS — M79661 Pain in right lower leg: Secondary | ICD-10-CM | POA: Insufficient documentation

## 2021-01-16 DIAGNOSIS — M79651 Pain in right thigh: Secondary | ICD-10-CM | POA: Diagnosis not present

## 2021-01-16 DIAGNOSIS — Z7901 Long term (current) use of anticoagulants: Secondary | ICD-10-CM | POA: Diagnosis not present

## 2021-01-16 DIAGNOSIS — R52 Pain, unspecified: Secondary | ICD-10-CM

## 2021-01-16 DIAGNOSIS — M25851 Other specified joint disorders, right hip: Secondary | ICD-10-CM | POA: Diagnosis not present

## 2021-01-16 DIAGNOSIS — W010XXA Fall on same level from slipping, tripping and stumbling without subsequent striking against object, initial encounter: Secondary | ICD-10-CM | POA: Insufficient documentation

## 2021-01-16 DIAGNOSIS — M9701XA Periprosthetic fracture around internal prosthetic right hip joint, initial encounter: Secondary | ICD-10-CM | POA: Diagnosis not present

## 2021-01-16 DIAGNOSIS — Z86718 Personal history of other venous thrombosis and embolism: Secondary | ICD-10-CM | POA: Diagnosis not present

## 2021-01-16 DIAGNOSIS — I5032 Chronic diastolic (congestive) heart failure: Secondary | ICD-10-CM | POA: Diagnosis not present

## 2021-01-16 DIAGNOSIS — M25551 Pain in right hip: Secondary | ICD-10-CM | POA: Diagnosis not present

## 2021-01-16 DIAGNOSIS — S32591A Other specified fracture of right pubis, initial encounter for closed fracture: Secondary | ICD-10-CM | POA: Diagnosis not present

## 2021-01-16 DIAGNOSIS — M25561 Pain in right knee: Secondary | ICD-10-CM | POA: Diagnosis not present

## 2021-01-16 DIAGNOSIS — I2510 Atherosclerotic heart disease of native coronary artery without angina pectoris: Secondary | ICD-10-CM | POA: Insufficient documentation

## 2021-01-16 DIAGNOSIS — D631 Anemia in chronic kidney disease: Secondary | ICD-10-CM | POA: Insufficient documentation

## 2021-01-16 MED ORDER — HYDROCODONE-ACETAMINOPHEN 5-325 MG PO TABS
1.0000 | ORAL_TABLET | Freq: Once | ORAL | Status: AC
Start: 2021-01-16 — End: 2021-01-16
  Administered 2021-01-16: 1 via ORAL
  Filled 2021-01-16: qty 1

## 2021-01-16 MED ORDER — HYDROCODONE-ACETAMINOPHEN 5-325 MG PO TABS: 1.0000 | ORAL_TABLET | ORAL | 0 refills | Status: DC | PRN

## 2021-01-16 NOTE — ED Notes (Signed)
Reviewed discharge instructions with patient and spouse. Follow-up care and medications reviewed. Patient and spouse verbalized understanding. Patient A&Ox4, VSS upon discharge. 

## 2021-01-16 NOTE — Progress Notes (Signed)
   01/16/21 2301  TOC ED Mini Assessment  TOC Time spent with patient (minutes): 20  PING Used in TOC Assessment No  Admission or Readmission Diverted Yes  Interventions which prevented an admission or readmission [facility] or Services  What brought you to the Emergency Department?  s/p mechanical fall

## 2021-01-16 NOTE — Discharge Instructions (Signed)
Your hip is fractured around the prosthesis.  Be careful when you are walking noise use a walker when you get up with assistance from a family member or aide.  Home health services will contact you to come to your home for assessment and treatment.  Call the orthopedic doctor for a follow-up appointment in 2 weeks.  Return here if needed.  We sent a prescription for pain reliever to your pharmacy.

## 2021-01-16 NOTE — ED Provider Notes (Signed)
[facility] EMERGENCY DEPARTMENT Provider Note   CSN: [id number] Arrival date & time:        History Chief Complaint  Patient presents with  . Leg Pain  . Fall    Patricia [name] is a 87 y.o. female with PMH/o COPD, CAD, DVT, GERD who presents for evaluation of RLE after a mechanical fall that occurred last night.  Patient reports that she was attempting to use a new walker and states that she tripped and fell, landing on her left leg.  She does not think she hit her head or had any LOC.  Husband was in the room giving mediately.  He states LOC.  He helped the patient up and assisted her back to the bed.  She states initially afterwards, she was able to put some weight on her leg since then has had pain noted to her right lower extremity.  She states that today, she had difficulty ambulating bearing weight and it was causing significantly more pain.  This is what prompted them to call EMS to bring her to the hospital.  She normally walks with the assistance of a walker.  She denies any neck pain, chest pain, difficulty breathing, abdominal pain, numbness/weakness.  The history is provided by the patient.       Past Medical History:  Diagnosis Date  . Abnormal gait   . Anxiety   . Arthritis   . Asthma   . COPD, mild ([place])   . Coronary artery disease    pt denies  . Depression   . Diverticulitis   . Dizziness   . DVT (deep venous thrombosis) ([place])   . GERD (gastroesophageal reflux disease)   . Hypertension   . Hypothyroidism   . Multiple allergies   . Pneumonia   . Pre-diabetes   . Reflux   . Renal disorder    had kidney transplant in 2009    Patient Active Problem List   Diagnosis Date Noted  . COVID-19 virus infection 12/27/2020  . Unspecified protein-calorie malnutrition ([place]) 12/21/2020  . Dehydration   . Failure to thrive in adult   . Palliative care by specialist   . Goals of care, counseling/discussion   . Pain in both upper extremities  04/11/2020  . History of anemia due to CKD 12/31/2019  . Anemia in chronic kidney disease (CKD) 12/31/2019  . Generalized weakness 12/30/2019  . Cholecystitis with cholelithiasis 07/01/2018  . Postoperative anemia due to acute blood loss 02/15/2016  . Hyperlipidemia 02/15/2016  . Displaced fracture of right femoral neck ([place]) 02/10/2016  . Chronic diastolic CHF (congestive heart failure) ([place]) 02/08/2016  . Closed right hip fracture ([place]) 02/08/2016  . Hip fracture ([place]) 02/08/2016  . Acute diastolic CHF (congestive heart failure) ([place]) 05/27/2015  . Urinary incontinence 05/27/2015  . Escherichia coli urinary tract infection   . Other emphysema ([place])   . Hypokalemia   . Hypomagnesemia   . Sepsis secondary to UTI ([place]) 05/22/2015  . DVT, lower extremity, proximal ([place]) 03/02/2014  . Chronic kidney disease, stage 4, severely decreased GFR (H[facility]) 03/02/2014  . Essential hypertension 03/02/2014  . DVT (deep venous thrombosis) ([place]) 03/01/2014  . Acute renal failure superimposed on stage 3 chronic kidney disease ([place]) 03/01/2014  . Gastroenteritis 11/22/2013  . Nausea & vomiting 11/22/2013  . Hypercalcemia 11/22/2013  . Infection due to ESBL-producing Escherichia coli 10/07/2013  . Septicemia due to E. coli ([place]) 10/07/2013  . Urinary tract infection 10/05/2013  . Sepsis ([place])  10/05/2013  . History of renal transplant   . Thyroid disease   . Multiple allergies   . Abnormal gait   . Reflux   . Diverticulitis   . Hypothyroidism   . Depression   . Anxiety   . COPD (chronic obstructive pulmonary disease) ([place])   . Dizziness   . Diabetes type 2, controlled [facility])     Past Surgical History:  Procedure Laterality Date  . ANTERIOR APPROACH HEMI HIP ARTHROPLASTY Right 02/10/2016   Procedure: TOTAL HIP ARTHROPLASTY ANTERIOR APPROACH;  Surgeon: Rod Can, MD;  Location: [facility];  Service: Orthopedics;  Laterality: Right;  . CHOLECYSTECTOMY N/A 07/01/2018   Procedure: LAPAROSCOPIC  CHOLECYSTECTOMY WITH INTRAOPERATIVE CHOLANGIOGRAM;  Surgeon: Jovita Kussmaul, MD;  Location: [facility];  Service: General;  Laterality: N/A;  . COLONOSCOPY    . NEPHRECTOMY TRANSPLANTED ORGAN    . TUMOR REMOVAL     Brain      OB History   No obstetric history on file.     Family History  Problem Relation Age of Onset  . Alzheimer's disease Mother   . High Cholesterol Mother   . Alcoholism Father     Social History   Tobacco Use  . Smoking status: Never Smoker  . Smokeless tobacco: Never Used  Vaping Use  . Vaping Use: Never used  Substance Use Topics  . Alcohol use: No  . Drug use: No    Home Medications Prior to Admission medications   Medication Sig Start Date End Date Taking? Authorizing Provider  acetaminophen (TYLENOL) 325 MG tablet Take 2 tablets (650 mg total) by mouth every 6 (six) hours as needed. 04/27/20   Ngetich, Dinah C, NP  albuterol (VENTOLIN HFA) 108 (90 Base) MCG/ACT inhaler Inhale 2 puffs into the lungs every 6 (six) hours as needed for wheezing or shortness of breath. 01/12/21   Medina-Vargas, Monina C, NP  amLODipine (NORVASC) 5 MG tablet Take 1 tablet (5 mg total) by mouth daily. 01/12/21   Medina-Vargas, Monina C, NP  apixaban (ELIQUIS) 2.5 MG TABS tablet Take 1 tablet (2.5 mg total) by mouth 2 (two) times daily. 01/12/21   Medina-Vargas, Monina C, NP  azaTHIOprine (IMURAN) 50 MG tablet Take 1 tablet (50 mg total) by mouth 2 (two) times daily. 01/12/21   Medina-Vargas, Monina C, NP  bisacodyl (DULCOLAX) 5 MG EC tablet Take 1 tablet (5 mg total) by mouth daily as needed for moderate constipation. 01/12/21   Medina-Vargas, Monina C, NP  budesonide-formoterol (SYMBICORT) 160-4.5 MCG/ACT inhaler Inhale 2 puffs into the lungs in the morning and at bedtime. 01/12/21   Medina-Vargas, Monina C, NP  Cholecalciferol (VITAMIN D) 50 MCG (2000 UT) tablet Take 1 tablet (2,000 Units total) by mouth every evening. 01/12/21   Medina-Vargas, Monina C, NP  cycloSPORINE modified  (NEORAL) 100 MG capsule Take 1 capsule (100 mg total) by mouth 2 (two) times daily. 01/12/21   Medina-Vargas, Monina C, NP  docusate sodium (COLACE) 100 MG capsule Take 1 capsule (100 mg total) by mouth 2 (two) times daily. 01/12/21   Medina-Vargas, Monina C, NP  famotidine (PEPCID) 20 MG tablet Take 1 tablet (20 mg total) by mouth daily with supper. 01/12/21   Medina-Vargas, Monina C, NP  ferrous sulfate 325 (65 FE) MG tablet Take 1 tablet (325 mg total) by mouth daily with breakfast. 01/12/21   Medina-Vargas, Monina C, NP  FLUoxetine (PROZAC) 20 MG tablet Take 1 tablet (20 mg total) by mouth daily. 01/12/21   Medina-Vargas, Monina  C, NP  fluticasone (FLONASE) 50 MCG/ACT nasal spray Place 1 spray into both nostrils daily as needed for allergies or rhinitis. 01/12/21   Medina-Vargas, Monina C, NP  Glucerna (GLUCERNA) LIQD Take 237 mLs by mouth 2 (two) times daily. 01/12/21   Medina-Vargas, Monina C, NP  insulin lispro (HUMALOG) 100 UNIT/ML KwikPen Inject 8 Units into the skin daily. 01/12/21   Medina-Vargas, Monina C, NP  levothyroxine (SYNTHROID) 75 MCG tablet Take 1 tablet (75 mcg total) by mouth daily. 01/12/21   Medina-Vargas, Monina C, NP  loratadine (CLARITIN) 10 MG tablet Take 1 tablet (10 mg total) by mouth daily as needed for allergies. 01/12/21   Medina-Vargas, Monina C, NP  Lysine 500 MG TABS Take 1 tablet (500 mg total) by mouth daily. 01/12/21   Medina-Vargas, Monina C, NP  magnesium hydroxide (MILK OF MAGNESIA) 400 MG/5ML suspension Take by mouth daily as needed for mild constipation.    [provider]  metoCLOPramide (REGLAN) 5 MG tablet Take 1 tablet (5 mg total) by mouth 4 (four) times daily. 01/12/21   Medina-Vargas, Monina C, NP  metoprolol tartrate (LOPRESSOR) 25 MG tablet Take 1 tablet (25 mg total) by mouth 2 (two) times daily. 01/12/21   Medina-Vargas, Monina C, NP  mirabegron ER (MYRBETRIQ) 50 MG TB24 tablet Take 1 tablet (50 mg total) by mouth every evening. 01/12/21    Medina-Vargas, Monina C, NP  mirtazapine (REMERON) 15 MG tablet Take 0.5 tablets (7.5 mg total) by mouth at bedtime. 01/12/21   Medina-Vargas, Monina C, NP  Multiple Vitamins-Minerals (PRESERVISION/LUTEIN PO) Take 1 capsule by mouth daily.    [provider]  NON FORMULARY Magic cup twice daily to promote weight gain    [provider]  omeprazole (PRILOSEC) 20 MG capsule Take 1 capsule (20 mg total) by mouth daily. 01/12/21   Medina-Vargas, Monina C, NP  ondansetron (ZOFRAN) 4 MG tablet Take 1 tablet (4 mg total) by mouth every 4 (four) hours as needed. 04/27/20   Ngetich, Dinah C, NP  phenylephrine-shark liver oil-mineral oil-petrolatum (PREPARATION H) 0.25-3-14-71.9 % rectal ointment Place 1 application rectally 2 (two) times daily as needed for hemorrhoids. 04/27/20   Ngetich, Dinah C, NP  polyethylene glycol (MIRALAX / GLYCOLAX) 17 g packet Take 17 g by mouth daily as needed for mild constipation. 04/27/20   Ngetich, Dinah C, NP  pravastatin (PRAVACHOL) 40 MG tablet Take 1 tablet (40 mg total) by mouth daily. 01/12/21   Medina-Vargas, Monina C, NP  predniSONE (DELTASONE) 5 MG tablet Take 1 tablet (5 mg total) by mouth daily with breakfast. 01/12/21   Medina-Vargas, Monina C, NP  sulfamethoxazole-trimethoprim (BACTRIM) 400-80 MG tablet Take 1 tablet by mouth every Monday, Wednesday, and Friday. 01/13/21   Medina-Vargas, Monina C, NP    Allergies    Macrolides and ketolides, Aricept [donepezil hcl], Codeine, Morphine, Penicillin g, Penicillins, Statins, Streptomycin, Tetracycline, and Tetracyclines & related  Review of Systems   Review of Systems  Respiratory: Negative for cough and shortness of breath.   Cardiovascular: Negative for chest pain.  Gastrointestinal: Negative for abdominal pain, nausea and vomiting.  Genitourinary: Negative for dysuria and hematuria.  Musculoskeletal:       RLE pain  Neurological: Negative for weakness, numbness and headaches.  All other systems  reviewed and are negative.   Physical Exam Updated Vital Signs BP (!) 129/55   Pulse (!) 53   Temp 98.8 F (37.1 C) (Oral)   Resp 16   Ht '5\' 1"'$  (1.549 m)  Wt 49.2 kg   SpO2 98%   BMI 20.49 kg/m   Physical Exam Vitals and nursing note reviewed.  Constitutional:      Appearance: Normal appearance. She is well-developed.  HENT:     Head: Normocephalic and atraumatic.     Comments: No tenderness to palpation of skull. No deformities or crepitus noted. No open wounds, abrasions or lacerations.  Eyes:     General: Lids are normal.     Conjunctiva/sclera: Conjunctivae normal.     Pupils: Pupils are equal, round, and reactive to light.     Comments: PERRL. EOMs intact. No nystagmus. No neglect.   Neck:     Comments: Full flexion/extension and lateral movement of neck fully intact. No bony midline tenderness. No deformities or crepitus.  Cardiovascular:     Rate and Rhythm: Normal rate and regular rhythm.     Pulses: Normal pulses.          Radial pulses are 2+ on the right side and 2+ on the left side.       Dorsalis pedis pulses are 2+ on the right side.     Heart sounds: Normal heart sounds. No murmur heard. No friction rub. No gallop.   Pulmonary:     Effort: Pulmonary effort is normal.     Breath sounds: Normal breath sounds.  Abdominal:     Palpations: Abdomen is soft. Abdomen is not rigid.     Tenderness: There is no abdominal tenderness. There is no guarding.     Comments: Abdomen is soft, non-distended, non-tender. No rigidity, No guarding. No peritoneal signs.  Musculoskeletal:        General: Normal range of motion.     Cervical back: Full passive range of motion without pain.     Comments: Tenderness palpation noted diffusely to the right femur, right knee.  The leg is partially flexed at the knee and she has pain when I attempt to extend the knee.  There is no deformity or crepitus noted.  No bony tenderness noted to the right hip, right tib-fib, right ankle.  No  pelvic instability.  No tenderness palpation of the left upper extremity, left lower extremity.+  Skin:    General: Skin is warm and dry.     Capillary Refill: Capillary refill takes less than 2 seconds.     Comments: Good distal cap refill. RLE is not dusky in appearance or cool to touch.  Neurological:     Mental Status: She is alert and oriented to person, place, and time.     Comments: Cranial nerves III-XII intact Follows commands, Moves all extremities  5/5 strength to BUE and BLE  Sensation intact throughout all major nerve distributions No slurred speech. No facial droop.   Psychiatric:        Speech: Speech normal.         ED Results / Procedures / Treatments   Labs (all labs ordered are listed, but only abnormal results are displayed) Labs Reviewed - No data to display  EKG None  Radiology DG Knee 1-2 Views Right  Result Date: 01/16/2021 CLINICAL DATA:  Right hip pain status post fall. EXAM: RIGHT KNEE - 1-2 VIEW COMPARISON:  None. FINDINGS: Advanced osteopenia. Mild joint space loss and spurring of the lateral patellofemoral compartments. No acute fracture or dislocation. Atherosclerotic changes seen throughout visualized arterial segments. IMPRESSION: No acute abnormality of the right knee. Electronically Signed   By: Miachel Roux M.D.   On: 01/16/2021 16:06  DG Femur Min 2 Views Right  Result Date: 01/16/2021 CLINICAL DATA:  Leg pain status post fall. EXAM: RIGHT FEMUR 2 VIEWS COMPARISON:  Right hip radiographs 02/08/2016 FINDINGS: Transverse lucency extending to the subtrochanteric region of the right total hip prosthesis is suspicious for a periprosthetic fracture. Diffuse osteopenia. Heterotopic calcifications seen superior and lateral to the right hip joint. Atherosclerotic changes seen throughout visualized arterial segments. IMPRESSION: Transverse lucency in the lateral proximal femoral diaphysis extending to the prosthesis is suspicious for nondisplaced fracture.  Further evaluation with CT would be beneficial. Electronically Signed   By: Miachel Roux M.D.   On: 01/16/2021 16:05    Procedures Procedures   Medications Ordered in ED Medications  HYDROcodone-acetaminophen (NORCO/VICODIN) 5-325 MG per tablet 1 tablet (1 tablet Oral Given 01/16/21 1503)    ED Course  I have reviewed the triage vital signs and the nursing notes.  Pertinent labs & imaging results that were available during my care of the patient were reviewed by me and considered in my medical decision making (see chart for details).    MDM Rules/Calculators/A&P                          87 year old female who presents for evaluation of a fall last night.  She reports she was attempting to ambulate with a new walker and states that she tripped and fell, landing on her right leg.  Did not hit her head, have any LOC.  Her husband was in the next room.  He came immediately after.  He states he initially assisted her up and walked her back to the bedroom.  She reports initially, she is able to put some mild weight on the leg but states that throughout the course of the day, her pain is progressively worsened, especially when she attempts to bear weight.  She normally walks with the assistance of a walker.  On initial arrival, she is afebrile, nontoxic-appearing.  Vital signs are stable.  On exam, she has tenderness palpation noted to the right femur, right knee.  No deformity or crepitus noted.  The knee is slightly flexed and she has pain when I attempt to extend or move the leg.  She has no signs of head injury, no neuro deficits on exam.  She tells me she did not hit her head or have any LOC.  At this time, do not feel that CT head is warranted.  We will plan for imaging of her right lower extremity.  X-ray shows a transverse lucency in the lateral proximal femoral diaphysis extending to the prosthesis concerning for a nondisplaced fracture. Knee XR negative for any acute fracture.   Reevaluation  after pain medication.  She is able to extend the leg a little bit further after pain medication.+  Discussed patient with Dr. Delfino Lovett (ortho) who reviewed the imaging. He recommends obtaining at CT scan for further evaluation and calling him back when this is done.   Patient signed out to Dr. Eulis Foster pending CT scan.   Portions of this note were generated with [employer]. Dictation errors may occur despite best attempts at proofreading.    Final Clinical Impression(s) / ED Diagnoses Final diagnoses:  Fall, initial encounter    Rx / DC Orders ED Discharge Orders    None       Desma Mcgregor 01/16/21 1954    Noemi Chapel, MD 01/20/21 [phone]

## 2021-01-16 NOTE — ED Provider Notes (Signed)
9:30 PM-checkout from [facility], to evaluate patient after return of CT imaging to assess for fracture definition, suspected on plain imaging.  Patient fairly comfortable at this time.  Husband describes patient using rolling walker, that he had around the house, for the first time today, at which time she fell.  She has not seen a PT recently for her gait disorder.  Her aides coming to the house were been encouraging patient to walk unassisted.  Patient is amenable to going home with support, using home health services and appropriate walker.  Husband is agreeable.  Case discussed with [facility] team who will see the patient and help to arrange for home health, which I have ordered.  Findings discussed with patient and husband, all questions answered.  Prescription sent to her pharmacy for [place].  Recommended follow-up with orthopedics in 2 weeks.  Home health services arranged.   Daleen Bo, MD 01/16/21 2237

## 2021-01-16 NOTE — ED Triage Notes (Signed)
Pt bib [facility] from home for a fall last night. Pt woke up this morning with constant pain in her R upper leg. Pt states "I was testing out new walker and it got away from me". Denies LOC

## 2021-01-16 NOTE — ED Provider Notes (Signed)
Patient arrives by paramedics from a fall last night, spouse helped her get back up into the bed, this morning was having more pain in the right upper leg, she was actually walking with a new walker, she lost balance when she was walking and fell onto her right side.  She has a prior history of a right hip prosthesis secondary to injury, on exam the patient does not want to move the way very much but has soft compartments, no other signs of injury to the patient's body, she is moving arms and has no signs of injury to the head.  Imaging to rule out fracture  Medical screening examination/treatment/procedure(s) were conducted as a shared visit with non-physician practitioner(s) and myself.  I personally evaluated the patient during the encounter.  Clinical Impression:   Final diagnoses:  Fall, initial encounter  Closed fracture of right hip, initial encounter [facility])         Noemi Chapel, MD 01/20/21 1118

## 2021-01-20 DIAGNOSIS — I82502 Chronic embolism and thrombosis of unspecified deep veins of left lower extremity: Secondary | ICD-10-CM | POA: Diagnosis not present

## 2021-01-20 DIAGNOSIS — I130 Hypertensive heart and chronic kidney disease with heart failure and stage 1 through stage 4 chronic kidney disease, or unspecified chronic kidney disease: Secondary | ICD-10-CM | POA: Diagnosis not present

## 2021-01-20 DIAGNOSIS — F339 Major depressive disorder, recurrent, unspecified: Secondary | ICD-10-CM | POA: Diagnosis not present

## 2021-01-20 DIAGNOSIS — F0280 Dementia in other diseases classified elsewhere without behavioral disturbance: Secondary | ICD-10-CM | POA: Diagnosis not present

## 2021-01-20 DIAGNOSIS — E039 Hypothyroidism, unspecified: Secondary | ICD-10-CM | POA: Diagnosis not present

## 2021-01-20 DIAGNOSIS — E1122 Type 2 diabetes mellitus with diabetic chronic kidney disease: Secondary | ICD-10-CM | POA: Diagnosis not present

## 2021-01-20 DIAGNOSIS — U071 COVID-19: Secondary | ICD-10-CM | POA: Diagnosis not present

## 2021-01-20 DIAGNOSIS — N184 Chronic kidney disease, stage 4 (severe): Secondary | ICD-10-CM | POA: Diagnosis not present

## 2021-01-20 DIAGNOSIS — I5032 Chronic diastolic (congestive) heart failure: Secondary | ICD-10-CM | POA: Diagnosis not present

## 2021-01-23 ENCOUNTER — Other Ambulatory Visit: Payer: Self-pay

## 2021-01-23 ENCOUNTER — Observation Stay (HOSPITAL_COMMUNITY): Payer: Medicare PPO

## 2021-01-23 ENCOUNTER — Inpatient Hospital Stay (HOSPITAL_COMMUNITY)
Admission: EM | Admit: 2021-01-23 | Discharge: 2021-01-26 | DRG: 535 | Disposition: A | Discharge status: Skilled Nursing Facility | Payer: Medicare PPO | Attending: Internal Medicine | Admitting: Internal Medicine

## 2021-01-23 ENCOUNTER — Encounter (HOSPITAL_COMMUNITY): Payer: Self-pay

## 2021-01-23 DIAGNOSIS — E1169 Type 2 diabetes mellitus with other specified complication: Secondary | ICD-10-CM | POA: Diagnosis present

## 2021-01-23 DIAGNOSIS — E785 Hyperlipidemia, unspecified: Secondary | ICD-10-CM | POA: Diagnosis present

## 2021-01-23 DIAGNOSIS — S72001A Fracture of unspecified part of neck of right femur, initial encounter for closed fracture: Principal | ICD-10-CM | POA: Diagnosis present

## 2021-01-23 DIAGNOSIS — R2681 Unsteadiness on feet: Secondary | ICD-10-CM | POA: Diagnosis not present

## 2021-01-23 DIAGNOSIS — W010XXA Fall on same level from slipping, tripping and stumbling without subsequent striking against object, initial encounter: Secondary | ICD-10-CM | POA: Diagnosis present

## 2021-01-23 DIAGNOSIS — R410 Disorientation, unspecified: Secondary | ICD-10-CM | POA: Diagnosis not present

## 2021-01-23 DIAGNOSIS — S72141A Displaced intertrochanteric fracture of right femur, initial encounter for closed fracture: Secondary | ICD-10-CM | POA: Diagnosis not present

## 2021-01-23 DIAGNOSIS — R262 Difficulty in walking, not elsewhere classified: Secondary | ICD-10-CM | POA: Diagnosis not present

## 2021-01-23 DIAGNOSIS — Z881 Allergy status to other antibiotic agents status: Secondary | ICD-10-CM | POA: Diagnosis not present

## 2021-01-23 DIAGNOSIS — W19XXXA Unspecified fall, initial encounter: Secondary | ICD-10-CM | POA: Diagnosis not present

## 2021-01-23 DIAGNOSIS — Z7401 Bed confinement status: Secondary | ICD-10-CM | POA: Diagnosis not present

## 2021-01-23 DIAGNOSIS — Z86718 Personal history of other venous thrombosis and embolism: Secondary | ICD-10-CM | POA: Diagnosis not present

## 2021-01-23 DIAGNOSIS — Z743 Need for continuous supervision: Secondary | ICD-10-CM | POA: Diagnosis not present

## 2021-01-23 DIAGNOSIS — Y92009 Unspecified place in unspecified non-institutional (private) residence as the place of occurrence of the external cause: Secondary | ICD-10-CM | POA: Diagnosis not present

## 2021-01-23 DIAGNOSIS — Z96649 Presence of unspecified artificial hip joint: Principal | ICD-10-CM

## 2021-01-23 DIAGNOSIS — J42 Unspecified chronic bronchitis: Secondary | ICD-10-CM | POA: Diagnosis not present

## 2021-01-23 DIAGNOSIS — E782 Mixed hyperlipidemia: Secondary | ICD-10-CM

## 2021-01-23 DIAGNOSIS — I129 Hypertensive chronic kidney disease with stage 1 through stage 4 chronic kidney disease, or unspecified chronic kidney disease: Secondary | ICD-10-CM | POA: Diagnosis not present

## 2021-01-23 DIAGNOSIS — Z888 Allergy status to other drugs, medicaments and biological substances status: Secondary | ICD-10-CM

## 2021-01-23 DIAGNOSIS — F0390 Unspecified dementia without behavioral disturbance: Secondary | ICD-10-CM | POA: Diagnosis present

## 2021-01-23 DIAGNOSIS — I10 Essential (primary) hypertension: Secondary | ICD-10-CM | POA: Diagnosis not present

## 2021-01-23 DIAGNOSIS — E1122 Type 2 diabetes mellitus with diabetic chronic kidney disease: Secondary | ICD-10-CM

## 2021-01-23 DIAGNOSIS — F32A Depression, unspecified: Secondary | ICD-10-CM | POA: Diagnosis present

## 2021-01-23 DIAGNOSIS — K219 Gastro-esophageal reflux disease without esophagitis: Secondary | ICD-10-CM | POA: Diagnosis present

## 2021-01-23 DIAGNOSIS — Z9181 History of falling: Secondary | ICD-10-CM | POA: Diagnosis not present

## 2021-01-23 DIAGNOSIS — R5381 Other malaise: Secondary | ICD-10-CM | POA: Diagnosis not present

## 2021-01-23 DIAGNOSIS — E039 Hypothyroidism, unspecified: Secondary | ICD-10-CM | POA: Diagnosis present

## 2021-01-23 DIAGNOSIS — Z880 Allergy status to penicillin: Secondary | ICD-10-CM

## 2021-01-23 DIAGNOSIS — Z8616 Personal history of COVID-19: Secondary | ICD-10-CM | POA: Diagnosis not present

## 2021-01-23 DIAGNOSIS — Z79899 Other long term (current) drug therapy: Secondary | ICD-10-CM

## 2021-01-23 DIAGNOSIS — I130 Hypertensive heart and chronic kidney disease with heart failure and stage 1 through stage 4 chronic kidney disease, or unspecified chronic kidney disease: Secondary | ICD-10-CM | POA: Diagnosis present

## 2021-01-23 DIAGNOSIS — J449 Chronic obstructive pulmonary disease, unspecified: Secondary | ICD-10-CM | POA: Diagnosis present

## 2021-01-23 DIAGNOSIS — N184 Chronic kidney disease, stage 4 (severe): Secondary | ICD-10-CM | POA: Diagnosis present

## 2021-01-23 DIAGNOSIS — Z66 Do not resuscitate: Secondary | ICD-10-CM | POA: Diagnosis present

## 2021-01-23 DIAGNOSIS — M6281 Muscle weakness (generalized): Secondary | ICD-10-CM | POA: Diagnosis not present

## 2021-01-23 DIAGNOSIS — Z79890 Hormone replacement therapy (postmenopausal): Secondary | ICD-10-CM

## 2021-01-23 DIAGNOSIS — M9701XA Periprosthetic fracture around internal prosthetic right hip joint, initial encounter: Secondary | ICD-10-CM

## 2021-01-23 DIAGNOSIS — Z885 Allergy status to narcotic agent status: Secondary | ICD-10-CM

## 2021-01-23 DIAGNOSIS — R1312 Dysphagia, oropharyngeal phase: Secondary | ICD-10-CM | POA: Diagnosis not present

## 2021-01-23 DIAGNOSIS — Z6820 Body mass index (BMI) 20.0-20.9, adult: Secondary | ICD-10-CM

## 2021-01-23 DIAGNOSIS — I5032 Chronic diastolic (congestive) heart failure: Secondary | ICD-10-CM | POA: Diagnosis present

## 2021-01-23 DIAGNOSIS — M978XXA Periprosthetic fracture around other internal prosthetic joint, initial encounter: Secondary | ICD-10-CM

## 2021-01-23 DIAGNOSIS — Z940 Kidney transplant status: Secondary | ICD-10-CM | POA: Diagnosis not present

## 2021-01-23 DIAGNOSIS — M9701XD Periprosthetic fracture around internal prosthetic right hip joint, subsequent encounter: Secondary | ICD-10-CM | POA: Diagnosis not present

## 2021-01-23 DIAGNOSIS — Z7901 Long term (current) use of anticoagulants: Secondary | ICD-10-CM

## 2021-01-23 DIAGNOSIS — Z820 Family history of epilepsy and other diseases of the nervous system: Secondary | ICD-10-CM

## 2021-01-23 DIAGNOSIS — M2550 Pain in unspecified joint: Secondary | ICD-10-CM | POA: Diagnosis not present

## 2021-01-23 DIAGNOSIS — R32 Unspecified urinary incontinence: Secondary | ICD-10-CM | POA: Diagnosis present

## 2021-01-23 DIAGNOSIS — Z7952 Long term (current) use of systemic steroids: Secondary | ICD-10-CM

## 2021-01-23 DIAGNOSIS — I2510 Atherosclerotic heart disease of native coronary artery without angina pectoris: Secondary | ICD-10-CM | POA: Diagnosis present

## 2021-01-23 DIAGNOSIS — E43 Unspecified severe protein-calorie malnutrition: Secondary | ICD-10-CM

## 2021-01-23 DIAGNOSIS — Z794 Long term (current) use of insulin: Secondary | ICD-10-CM

## 2021-01-23 DIAGNOSIS — I69828 Other speech and language deficits following other cerebrovascular disease: Secondary | ICD-10-CM | POA: Diagnosis not present

## 2021-01-23 LAB — BASIC METABOLIC PANEL
Anion gap: 11 (ref 5–15)
BUN: 37 mg/dL — ABNORMAL HIGH (ref 8–23)
CO2: 19 mmol/L — ABNORMAL LOW (ref 22–32)
Calcium: 9.8 mg/dL (ref 8.9–10.3)
Chloride: 106 mmol/L (ref 98–111)
Creatinine, Ser: 1.92 mg/dL — ABNORMAL HIGH (ref 0.44–1.00)
GFR, Estimated: 25 mL/min — ABNORMAL LOW (ref >60)
Glucose, Bld: 115 mg/dL — ABNORMAL HIGH (ref 70–99)
Potassium: 4.4 mmol/L (ref 3.5–5.1)
Sodium: 136 mmol/L (ref 135–145)

## 2021-01-23 LAB — CBC WITH DIFFERENTIAL/PLATELET
Abs Immature Granulocytes: 0.03 10*3/uL (ref 0.00–0.07)
Basophils Absolute: 0 10*3/uL (ref 0.0–0.1)
Basophils Relative: 0 %
Eosinophils Absolute: 0.1 10*3/uL (ref 0.0–0.5)
Eosinophils Relative: 1 %
HCT: 32 % — ABNORMAL LOW (ref 36.0–46.0)
Hemoglobin: 10.5 g/dL — ABNORMAL LOW (ref 12.0–15.0)
Immature Granulocytes: 0 %
Lymphocytes Relative: 31 %
Lymphs Abs: 2.3 10*3/uL (ref 0.7–4.0)
MCH: 31.5 pg (ref 26.0–34.0)
MCHC: 32.8 g/dL (ref 30.0–36.0)
MCV: 96.1 fL (ref 80.0–100.0)
Monocytes Absolute: 0.6 10*3/uL (ref 0.1–1.0)
Monocytes Relative: 8 %
Neutro Abs: 4.5 10*3/uL (ref 1.7–7.7)
Neutrophils Relative %: 60 %
Platelets: 347 10*3/uL (ref 150–400)
RBC: 3.33 MIL/uL — ABNORMAL LOW (ref 3.87–5.11)
RDW: 14.3 % (ref 11.5–15.5)
WBC: 7.5 10*3/uL (ref 4.0–10.5)
nRBC: 0 % (ref 0.0–0.2)

## 2021-01-23 LAB — HEMOGLOBIN A1C
Hgb A1c MFr Bld: 5.3 % (ref 4.8–5.6)
Mean Plasma Glucose: 105.41 mg/dL

## 2021-01-23 LAB — GLUCOSE, CAPILLARY
Glucose-Capillary: 110 mg/dL — ABNORMAL HIGH (ref 70–99)
Glucose-Capillary: 115 mg/dL — ABNORMAL HIGH (ref 70–99)
Glucose-Capillary: 176 mg/dL — ABNORMAL HIGH (ref 70–99)
Glucose-Capillary: 137 mg/dL — ABNORMAL HIGH (ref 70–99)

## 2021-01-23 MED ORDER — METOCLOPRAMIDE HCL 5 MG PO TABS
5.0000 mg | ORAL_TABLET | Freq: Three times a day (TID) | ORAL | Status: DC
  Administered 2021-01-23 – 2021-01-26 (×11): 5 mg via ORAL
  Filled 2021-01-23 (×12): qty 1

## 2021-01-23 MED ORDER — LEVOTHYROXINE SODIUM 50 MCG PO TABS
75.0000 ug | ORAL_TABLET | Freq: Every day | ORAL | Status: DC
  Administered 2021-01-24 – 2021-01-26 (×3): 75 ug via ORAL
  Filled 2021-01-23 (×3): qty 1

## 2021-01-23 MED ORDER — ACETAMINOPHEN 325 MG PO TABS
650.0000 mg | ORAL_TABLET | Freq: Four times a day (QID) | ORAL | Status: DC | PRN
  Administered 2021-01-24 – 2021-01-25 (×2): 650 mg via ORAL
  Filled 2021-01-23 (×2): qty 2

## 2021-01-23 MED ORDER — ALBUTEROL SULFATE (2.5 MG/3ML) 0.083% IN NEBU: 3.0000 mL | INHALATION_SOLUTION | Freq: Four times a day (QID) | RESPIRATORY_TRACT | Status: DC | PRN

## 2021-01-23 MED ORDER — PANTOPRAZOLE SODIUM 40 MG PO TBEC
40.0000 mg | DELAYED_RELEASE_TABLET | Freq: Every day | ORAL | Status: DC
  Administered 2021-01-24 – 2021-01-26 (×3): 40 mg via ORAL
  Filled 2021-01-23 (×3): qty 1

## 2021-01-23 MED ORDER — MIRABEGRON ER 25 MG PO TB24
50.0000 mg | ORAL_TABLET | Freq: Every evening | ORAL | Status: DC
  Administered 2021-01-24 – 2021-01-25 (×2): 50 mg via ORAL
  Filled 2021-01-23 (×3): qty 2

## 2021-01-23 MED ORDER — FAMOTIDINE 20 MG PO TABS
20.0000 mg | ORAL_TABLET | Freq: Every day | ORAL | Status: DC
  Administered 2021-01-24 – 2021-01-25 (×2): 20 mg via ORAL
  Filled 2021-01-23 (×2): qty 1

## 2021-01-23 MED ORDER — METOCLOPRAMIDE HCL 10 MG PO TABS: 5.0000 mg | ORAL_TABLET | Freq: Four times a day (QID) | ORAL | Status: DC

## 2021-01-23 MED ORDER — AMLODIPINE BESYLATE 5 MG PO TABS: 5.0000 mg | ORAL_TABLET | Freq: Every day | ORAL | Status: DC

## 2021-01-23 MED ORDER — AZATHIOPRINE 50 MG PO TABS
50.0000 mg | ORAL_TABLET | Freq: Two times a day (BID) | ORAL | Status: DC
  Administered 2021-01-23 – 2021-01-26 (×6): 50 mg via ORAL
  Filled 2021-01-23 (×6): qty 1

## 2021-01-23 MED ORDER — AMLODIPINE BESYLATE 5 MG PO TABS
5.0000 mg | ORAL_TABLET | Freq: Every day | ORAL | Status: DC
  Administered 2021-01-23 – 2021-01-26 (×4): 5 mg via ORAL
  Filled 2021-01-23 (×4): qty 1

## 2021-01-23 MED ORDER — OXYBUTYNIN CHLORIDE ER 5 MG PO TB24
10.0000 mg | ORAL_TABLET | Freq: Every day | ORAL | Status: DC
  Administered 2021-01-23 – 2021-01-25 (×3): 10 mg via ORAL
  Filled 2021-01-23 (×3): qty 2

## 2021-01-23 MED ORDER — METOPROLOL TARTRATE 25 MG PO TABS
25.0000 mg | ORAL_TABLET | Freq: Two times a day (BID) | ORAL | Status: DC
  Administered 2021-01-23 – 2021-01-26 (×6): 25 mg via ORAL
  Filled 2021-01-23 (×6): qty 1

## 2021-01-23 MED ORDER — LACTATED RINGERS IV SOLN: INTRAVENOUS | Status: DC

## 2021-01-23 MED ORDER — APIXABAN 2.5 MG PO TABS
2.5000 mg | ORAL_TABLET | Freq: Two times a day (BID) | ORAL | Status: DC
  Administered 2021-01-23 – 2021-01-26 (×6): 2.5 mg via ORAL
  Filled 2021-01-23 (×7): qty 1

## 2021-01-23 MED ORDER — FERROUS SULFATE 325 (65 FE) MG PO TABS
325.0000 mg | ORAL_TABLET | Freq: Every day | ORAL | Status: DC
  Administered 2021-01-24 – 2021-01-26 (×3): 325 mg via ORAL
  Filled 2021-01-23 (×3): qty 1

## 2021-01-23 MED ORDER — INSULIN ASPART 100 UNIT/ML ~~LOC~~ SOLN
0.0000 [IU] | Freq: Three times a day (TID) | SUBCUTANEOUS | Status: DC
  Administered 2021-01-23: 2 [IU] via SUBCUTANEOUS
  Administered 2021-01-23 – 2021-01-24 (×2): 3 [IU] via SUBCUTANEOUS
  Administered 2021-01-24: 2 [IU] via SUBCUTANEOUS
  Administered 2021-01-24: 3 [IU] via SUBCUTANEOUS
  Administered 2021-01-25: 2 [IU] via SUBCUTANEOUS
  Administered 2021-01-25: 3 [IU] via SUBCUTANEOUS
  Administered 2021-01-25: 2 [IU] via SUBCUTANEOUS
  Filled 2021-01-23: qty 0.15

## 2021-01-23 MED ORDER — SULFAMETHOXAZOLE-TRIMETHOPRIM 400-80 MG PO TABS
1.0000 | ORAL_TABLET | ORAL | Status: DC
  Administered 2021-01-25: 1 via ORAL
  Filled 2021-01-23: qty 1

## 2021-01-23 MED ORDER — POLYETHYLENE GLYCOL 3350 17 G PO PACK: 17.0000 g | PACK | Freq: Every day | ORAL | Status: DC | PRN

## 2021-01-23 MED ORDER — LORATADINE 10 MG PO TABS: 10.0000 mg | ORAL_TABLET | Freq: Every day | ORAL | Status: DC | PRN

## 2021-01-23 MED ORDER — FLUTICASONE PROPIONATE 50 MCG/ACT NA SUSP: 1.0000 | Freq: Every day | NASAL | Status: DC | PRN

## 2021-01-23 MED ORDER — ADULT MULTIVITAMIN W/MINERALS CH
1.0000 | ORAL_TABLET | Freq: Every day | ORAL | Status: DC
  Administered 2021-01-24 – 2021-01-26 (×3): 1 via ORAL
  Filled 2021-01-23 (×3): qty 1

## 2021-01-23 MED ORDER — INSULIN LISPRO (1 UNIT DIAL) 100 UNIT/ML (KWIKPEN): 8.0000 [IU] | PEN_INJECTOR | Freq: Every day | SUBCUTANEOUS | Status: DC

## 2021-01-23 MED ORDER — PRAVASTATIN SODIUM 20 MG PO TABS
40.0000 mg | ORAL_TABLET | Freq: Every day | ORAL | Status: DC
  Administered 2021-01-24 – 2021-01-26 (×3): 40 mg via ORAL
  Filled 2021-01-23 (×3): qty 2

## 2021-01-23 MED ORDER — ACETAMINOPHEN 650 MG RE SUPP: 650.0000 mg | Freq: Four times a day (QID) | RECTAL | Status: DC | PRN

## 2021-01-23 MED ORDER — FLUTICASONE FUROATE-VILANTEROL 200-25 MCG/INH IN AEPB
1.0000 | INHALATION_SPRAY | Freq: Every day | RESPIRATORY_TRACT | Status: DC
  Administered 2021-01-23 – 2021-01-26 (×4): 1 via RESPIRATORY_TRACT
  Filled 2021-01-23: qty 28

## 2021-01-23 MED ORDER — FENTANYL CITRATE (PF) 100 MCG/2ML IJ SOLN
25.0000 ug | INTRAMUSCULAR | Status: DC | PRN
  Administered 2021-01-23 – 2021-01-25 (×5): 25 ug via INTRAVENOUS
  Filled 2021-01-23 (×5): qty 2

## 2021-01-23 MED ORDER — FLUOXETINE HCL 20 MG PO CAPS
20.0000 mg | ORAL_CAPSULE | Freq: Every day | ORAL | Status: DC
  Administered 2021-01-24 – 2021-01-26 (×3): 20 mg via ORAL
  Filled 2021-01-23 (×3): qty 1

## 2021-01-23 MED ORDER — PREDNISONE 5 MG PO TABS
5.0000 mg | ORAL_TABLET | Freq: Every day | ORAL | Status: DC
  Administered 2021-01-24 – 2021-01-26 (×3): 5 mg via ORAL
  Filled 2021-01-23 (×3): qty 1

## 2021-01-23 MED ORDER — CYCLOSPORINE MODIFIED (NEORAL) 100 MG PO CAPS
100.0000 mg | ORAL_CAPSULE | Freq: Two times a day (BID) | ORAL | Status: DC
  Administered 2021-01-23 – 2021-01-26 (×6): 100 mg via ORAL
  Filled 2021-01-23 (×6): qty 1

## 2021-01-23 MED ORDER — FENTANYL CITRATE (PF) 100 MCG/2ML IJ SOLN
50.0000 ug | Freq: Once | INTRAMUSCULAR | Status: AC
  Administered 2021-01-23: 50 ug via INTRAVENOUS
  Filled 2021-01-23: qty 2

## 2021-01-23 MED ORDER — FENTANYL CITRATE (PF) 100 MCG/2ML IJ SOLN: 12.5000 ug | INTRAMUSCULAR | Status: DC | PRN

## 2021-01-23 MED ORDER — ACETAMINOPHEN 325 MG PO TABS
650.0000 mg | ORAL_TABLET | Freq: Two times a day (BID) | ORAL | Status: DC
  Administered 2021-01-23 – 2021-01-26 (×6): 650 mg via ORAL
  Filled 2021-01-23 (×6): qty 2

## 2021-01-23 MED ORDER — MIRTAZAPINE 7.5 MG PO TABS: 7.5000 mg | ORAL_TABLET | Freq: Every day | ORAL | Status: DC

## 2021-01-23 MED ORDER — ENSURE ENLIVE PO LIQD
237.0000 mL | Freq: Three times a day (TID) | ORAL | Status: DC
  Administered 2021-01-23 – 2021-01-26 (×10): 237 mL via ORAL

## 2021-01-23 MED ORDER — BISACODYL 5 MG PO TBEC: 5.0000 mg | DELAYED_RELEASE_TABLET | Freq: Every day | ORAL | Status: DC | PRN

## 2021-01-23 MED ORDER — FLUTICASONE FUROATE-VILANTEROL 200-25 MCG/INH IN AEPB: 1.0000 | INHALATION_SPRAY | Freq: Every day | RESPIRATORY_TRACT | Status: DC

## 2021-01-23 MED ORDER — DOCUSATE SODIUM 100 MG PO CAPS
100.0000 mg | ORAL_CAPSULE | Freq: Two times a day (BID) | ORAL | Status: DC
  Administered 2021-01-23 – 2021-01-26 (×6): 100 mg via ORAL
  Filled 2021-01-23 (×6): qty 1

## 2021-01-23 NOTE — ED Notes (Signed)
Called [address] to find out which RN would be getting this patient.  Attempted to give report to IP RN, however RN assigned is a day shift RN at this time, unable to give report.

## 2021-01-23 NOTE — ED Notes (Signed)
Spoke with house supervisor, pt to be admitted to 6th floor.

## 2021-01-23 NOTE — ED Provider Notes (Signed)
[facility] DEPT Provider Note   CSN: [id number] Arrival date & time: 01/23/21  0104     History Chief Complaint  Patient presents with  . Hip Pain  . Neck Pain    Patricia [name] is a 87 y.o. female.  87 year old female with a history of hypertension, hypothyroid, COPD, prediabetes, renal transplant for CKD stage V, failure to thrive presents to the emergency department for ongoing right lower extremity pain.  Reports pain has been most severe over the past 2 days, but has been coming and going since a fall 1 week ago.  Was evaluated in the emergency department with x-ray and hip CT notable for periprosthetic proximal femur fracture on the right.  The patient has been using Norco for pain management without significant relief.  She has not been able to bear weight on her right leg.  Has been assisted by her husband in transitioning; otherwise has been bedbound or restricted to the use of a wheelchair.  The patient is anticoagulated on Eliquis; hx chronic LLE DVT.  Denies additional trauma since prior visit.    Most recent code status: DNR  The history is provided by the patient and the spouse. No language interpreter was used.  Hip Pain  Neck Pain      Past Medical History:  Diagnosis Date  . Abnormal gait   . Anxiety   . Arthritis   . Asthma   . COPD, mild ([place])   . Coronary artery disease    pt denies  . Depression   . Diverticulitis   . Dizziness   . DVT (deep venous thrombosis) ([place])   . GERD (gastroesophageal reflux disease)   . Hypertension   . Hypothyroidism   . Multiple allergies   . Pneumonia   . Pre-diabetes   . Reflux   . Renal disorder    had kidney transplant in 2009    Patient Active Problem List   Diagnosis Date Noted  . COVID-19 virus infection 12/27/2020  . Unspecified protein-calorie malnutrition ([place]) 12/21/2020  . Dehydration   . Failure to thrive in adult   . Palliative care by specialist   . Goals of  care, counseling/discussion   . Pain in both upper extremities 04/11/2020  . History of anemia due to CKD 12/31/2019  . Anemia in chronic kidney disease (CKD) 12/31/2019  . Generalized weakness 12/30/2019  . Cholecystitis with cholelithiasis 07/01/2018  . Postoperative anemia due to acute blood loss 02/15/2016  . Hyperlipidemia 02/15/2016  . Displaced fracture of right femoral neck ([place]) 02/10/2016  . Chronic diastolic CHF (congestive heart failure) ([place]) 02/08/2016  . Closed right hip fracture ([place]) 02/08/2016  . Hip fracture ([place]) 02/08/2016  . Acute diastolic CHF (congestive heart failure) ([place]) 05/27/2015  . Urinary incontinence 05/27/2015  . Escherichia coli urinary tract infection   . Other emphysema ([place])   . Hypokalemia   . Hypomagnesemia   . Sepsis secondary to UTI ([place]) 05/22/2015  . DVT, lower extremity, proximal ([place]) 03/02/2014  . Chronic kidney disease, stage 4, severely decreased GFR ([facility]) 03/02/2014  . Essential hypertension 03/02/2014  . DVT (deep venous thrombosis) ([place]) 03/01/2014  . Acute renal failure superimposed on stage 3 chronic kidney disease ([place]) 03/01/2014  . Gastroenteritis 11/22/2013  . Nausea & vomiting 11/22/2013  . Hypercalcemia 11/22/2013  . Infection due to ESBL-producing Escherichia coli 10/07/2013  . Septicemia due to E. coli ([place]) 10/07/2013  . Urinary tract infection 10/05/2013  . Sepsis ([place]) 10/05/2013  .  History of renal transplant   . Thyroid disease   . Multiple allergies   . Abnormal gait   . Reflux   . Diverticulitis   . Hypothyroidism   . Depression   . Anxiety   . COPD (chronic obstructive pulmonary disease) ([place])   . Dizziness   . Diabetes type 2, controlled [facility])     Past Surgical History:  Procedure Laterality Date  . ANTERIOR APPROACH HEMI HIP ARTHROPLASTY Right 02/10/2016   Procedure: TOTAL HIP ARTHROPLASTY ANTERIOR APPROACH;  Surgeon: Rod Can, MD;  Location: [facility];  Service: Orthopedics;  Laterality: Right;   . CHOLECYSTECTOMY N/A 07/01/2018   Procedure: LAPAROSCOPIC CHOLECYSTECTOMY WITH INTRAOPERATIVE CHOLANGIOGRAM;  Surgeon: Jovita Kussmaul, MD;  Location: [facility];  Service: General;  Laterality: N/A;  . COLONOSCOPY    . NEPHRECTOMY TRANSPLANTED ORGAN    . TUMOR REMOVAL     Brain      OB History   No obstetric history on file.     Family History  Problem Relation Age of Onset  . Alzheimer's disease Mother   . High Cholesterol Mother   . Alcoholism Father     Social History   Tobacco Use  . Smoking status: Never Smoker  . Smokeless tobacco: Never Used  Vaping Use  . Vaping Use: Never used  Substance Use Topics  . Alcohol use: No  . Drug use: No    Home Medications Prior to Admission medications   Medication Sig Start Date End Date Taking? Authorizing Provider  acetaminophen (TYLENOL) 325 MG tablet Take 2 tablets (650 mg total) by mouth every 6 (six) hours as needed. 04/27/20   Ngetich, Dinah C, NP  albuterol (VENTOLIN HFA) 108 (90 Base) MCG/ACT inhaler Inhale 2 puffs into the lungs every 6 (six) hours as needed for wheezing or shortness of breath. 01/12/21   Medina-Vargas, Monina C, NP  amLODipine (NORVASC) 5 MG tablet Take 1 tablet (5 mg total) by mouth daily. 01/12/21   Medina-Vargas, Monina C, NP  apixaban (ELIQUIS) 2.5 MG TABS tablet Take 1 tablet (2.5 mg total) by mouth 2 (two) times daily. 01/12/21   Medina-Vargas, Monina C, NP  azaTHIOprine (IMURAN) 50 MG tablet Take 1 tablet (50 mg total) by mouth 2 (two) times daily. 01/12/21   Medina-Vargas, Monina C, NP  bisacodyl (DULCOLAX) 5 MG EC tablet Take 1 tablet (5 mg total) by mouth daily as needed for moderate constipation. 01/12/21   Medina-Vargas, Monina C, NP  budesonide-formoterol (SYMBICORT) 160-4.5 MCG/ACT inhaler Inhale 2 puffs into the lungs in the morning and at bedtime. 01/12/21   Medina-Vargas, Monina C, NP  Cholecalciferol (VITAMIN D) 50 MCG (2000 UT) tablet Take 1 tablet (2,000 Units total) by mouth every evening.  01/12/21   Medina-Vargas, Monina C, NP  cycloSPORINE modified (NEORAL) 100 MG capsule Take 1 capsule (100 mg total) by mouth 2 (two) times daily. 01/12/21   Medina-Vargas, Monina C, NP  docusate sodium (COLACE) 100 MG capsule Take 1 capsule (100 mg total) by mouth 2 (two) times daily. 01/12/21   Medina-Vargas, Monina C, NP  famotidine (PEPCID) 20 MG tablet Take 1 tablet (20 mg total) by mouth daily with supper. 01/12/21   Medina-Vargas, Monina C, NP  ferrous sulfate 325 (65 FE) MG tablet Take 1 tablet (325 mg total) by mouth daily with breakfast. 01/12/21   Medina-Vargas, Monina C, NP  FLUoxetine (PROZAC) 20 MG tablet Take 1 tablet (20 mg total) by mouth daily. 01/12/21   Medina-Vargas, Senaida Lange, NP  fluticasone (FLONASE) 50 MCG/ACT nasal spray Place 1 spray into both nostrils daily as needed for allergies or rhinitis. 01/12/21   Medina-Vargas, Monina C, NP  Glucerna (GLUCERNA) LIQD Take 237 mLs by mouth 2 (two) times daily. 01/12/21   Medina-Vargas, Monina C, NP  HYDROcodone-acetaminophen (NORCO) 5-325 MG tablet Take 1 tablet by mouth every 4 (four) hours as needed for moderate pain. 01/16/21   Daleen Bo, MD  insulin lispro (HUMALOG) 100 UNIT/ML KwikPen Inject 8 Units into the skin daily. 01/12/21   Medina-Vargas, Monina C, NP  levothyroxine (SYNTHROID) 75 MCG tablet Take 1 tablet (75 mcg total) by mouth daily. 01/12/21   Medina-Vargas, Monina C, NP  loratadine (CLARITIN) 10 MG tablet Take 1 tablet (10 mg total) by mouth daily as needed for allergies. 01/12/21   Medina-Vargas, Monina C, NP  Lysine 500 MG TABS Take 1 tablet (500 mg total) by mouth daily. 01/12/21   Medina-Vargas, Monina C, NP  magnesium hydroxide (MILK OF MAGNESIA) 400 MG/5ML suspension Take by mouth daily as needed for mild constipation.    [provider]  metoCLOPramide (REGLAN) 5 MG tablet Take 1 tablet (5 mg total) by mouth 4 (four) times daily. 01/12/21   Medina-Vargas, Monina C, NP  metoprolol tartrate (LOPRESSOR) 25 MG tablet  Take 1 tablet (25 mg total) by mouth 2 (two) times daily. 01/12/21   Medina-Vargas, Monina C, NP  mirabegron ER (MYRBETRIQ) 50 MG TB24 tablet Take 1 tablet (50 mg total) by mouth every evening. 01/12/21   Medina-Vargas, Monina C, NP  mirtazapine (REMERON) 15 MG tablet Take 0.5 tablets (7.5 mg total) by mouth at bedtime. 01/12/21   Medina-Vargas, Monina C, NP  Multiple Vitamins-Minerals (PRESERVISION/LUTEIN PO) Take 1 capsule by mouth daily.    [provider]  NON FORMULARY Magic cup twice daily to promote weight gain    [provider]  omeprazole (PRILOSEC) 20 MG capsule Take 1 capsule (20 mg total) by mouth daily. 01/12/21   Medina-Vargas, Monina C, NP  ondansetron (ZOFRAN) 4 MG tablet Take 1 tablet (4 mg total) by mouth every 4 (four) hours as needed. 04/27/20   Ngetich, Dinah C, NP  phenylephrine-shark liver oil-mineral oil-petrolatum (PREPARATION H) 0.25-3-14-71.9 % rectal ointment Place 1 application rectally 2 (two) times daily as needed for hemorrhoids. 04/27/20   Ngetich, Dinah C, NP  polyethylene glycol (MIRALAX / GLYCOLAX) 17 g packet Take 17 g by mouth daily as needed for mild constipation. 04/27/20   Ngetich, Dinah C, NP  pravastatin (PRAVACHOL) 40 MG tablet Take 1 tablet (40 mg total) by mouth daily. 01/12/21   Medina-Vargas, Monina C, NP  predniSONE (DELTASONE) 5 MG tablet Take 1 tablet (5 mg total) by mouth daily with breakfast. 01/12/21   Medina-Vargas, Monina C, NP  sulfamethoxazole-trimethoprim (BACTRIM) 400-80 MG tablet Take 1 tablet by mouth every Monday, Wednesday, and Friday. 01/13/21   Medina-Vargas, Monina C, NP    Allergies    Macrolides and ketolides, Aricept [donepezil hcl], Codeine, Morphine, Penicillin g, Penicillins, Statins, Streptomycin, Tetracycline, and Tetracyclines & related  Review of Systems   Review of Systems  Musculoskeletal: Positive for neck pain.  Ten systems reviewed and are negative for acute change, except as noted in the HPI.    Physical  Exam Updated Vital Signs BP (!) 148/71 (BP Location: Left Arm)   Pulse 95   Temp 98.8 F (37.1 C) (Oral)   Resp 16   Ht '5\' 1"'$  (1.549 m)   Wt 49.2 kg   SpO2 100%  BMI 20.49 kg/m   Physical Exam Vitals and nursing note reviewed.  Constitutional:      General: She is not in acute distress.    Appearance: She is well-developed. She is not diaphoretic.     Comments: Chronically ill, thin/frail appearing  HENT:     Head: Normocephalic and atraumatic.     Mouth/Throat:     Comments: Cheilitis  Eyes:     General: No scleral icterus.    Conjunctiva/sclera: Conjunctivae normal.  Cardiovascular:     Rate and Rhythm: Normal rate and regular rhythm.     Pulses: Normal pulses.     Comments: DP pulse 2+ in the RLE Pulmonary:     Effort: Pulmonary effort is normal. No respiratory distress.     Comments: Respirations even and unlabored Musculoskeletal:        General: Normal range of motion.     Cervical back: Normal range of motion.     Comments: TTP to the proximal R thigh without crepitus. No obvious deformity. Compartments of the RLE soft, compressible.  Skin:    General: Skin is warm and dry.     Coloration: Skin is not pale.     Findings: No erythema or rash.  Neurological:     Mental Status: She is alert and oriented to person, place, and time.     Coordination: Coordination normal.  Psychiatric:        Behavior: Behavior normal.     ED Results / Procedures / Treatments   Labs (all labs ordered are listed, but only abnormal results are displayed) Labs Reviewed  CBC WITH DIFFERENTIAL/PLATELET - Abnormal; Notable for the following components:      Result Value   RBC 3.33 (*)    Hemoglobin 10.5 (*)    HCT 32.0 (*)    All other components within normal limits  BASIC METABOLIC PANEL    EKG None  Radiology No results found.   DG Knee 1-2 Views Right  Result Date: 01/16/2021 CLINICAL DATA:  Right hip pain status post fall. EXAM: RIGHT KNEE - 1-2 VIEW COMPARISON:   None. FINDINGS: Advanced osteopenia. Mild joint space loss and spurring of the lateral patellofemoral compartments. No acute fracture or dislocation. Atherosclerotic changes seen throughout visualized arterial segments. IMPRESSION: No acute abnormality of the right knee. Electronically Signed   By: Miachel Roux M.D.   On: 01/16/2021 16:06   CT Hip Right Wo Contrast  Result Date: 01/16/2021 CLINICAL DATA:  Fall, periprosthetic right hip fracture. EXAM: CT OF THE RIGHT HIP WITHOUT CONTRAST TECHNIQUE: Multidetector CT imaging of the right hip was performed according to the standard protocol. Multiplanar CT image reconstructions were also generated. COMPARISON:  Radiographs 01/16/2021 FINDINGS: Bones/Joint/Cartilage Healing fractures of the right inferior pubic ramus, and of the lateral portion of the right superior pubic ramus extending into the right acetabular wall. Prosthesis. The most visible fracture component extends longitudinally through the greater tuberosity and down along the anterior cortex of the femur, and seems to terminate about halfway down the stem. There is also a nondisplaced posterior cortical fracture component for example on images 39 through 41 of series 757. Ligaments Suboptimally assessed by CT. Muscles and Tendons Unremarkable Soft tissues There is some heterotopic ossification in the soft tissues above the greater trochanter. Sigmoid colon diverticulosis. Subcutaneous edema along the buttocks medially. IMPRESSION: 1. Periprosthetic fracture of the right proximal femur with displaced anterior component and nondisplaced transverse and posterior components extending up to the greater trochanter. 2. Old healing  fractures of the right anterior acetabular wall and inferior pubic ramus. 3. Sigmoid colon diverticulosis. Electronically Signed   By: Van Clines M.D.   On: 01/16/2021 20:32   DG Femur Min 2 Views Right  Result Date: 01/16/2021 CLINICAL DATA:  Leg pain status post fall.  EXAM: RIGHT FEMUR 2 VIEWS COMPARISON:  Right hip radiographs 02/08/2016 FINDINGS: Transverse lucency extending to the subtrochanteric region of the right total hip prosthesis is suspicious for a periprosthetic fracture. Diffuse osteopenia. Heterotopic calcifications seen superior and lateral to the right hip joint. Atherosclerotic changes seen throughout visualized arterial segments. IMPRESSION: Transverse lucency in the lateral proximal femoral diaphysis extending to the prosthesis is suspicious for nondisplaced fracture. Further evaluation with CT would be beneficial. Electronically Signed   By: Miachel Roux M.D.   On: 01/16/2021 16:05    Procedures Procedures   Medications Ordered in ED Medications  lactated ringers infusion ( Intravenous New Bag/Given 01/23/21 0231)  fentaNYL (SUBLIMAZE) injection 50 mcg (50 mcg Intravenous Given 01/23/21 0232)    ED Course  I have reviewed the triage vital signs and the nursing notes.  Pertinent labs & imaging results that were available during my care of the patient were reviewed by me and considered in my medical decision making (see chart for details).  Clinical Course as of 01/23/21 0332  Mon Jan 23, 2021  0224 Tested positive for Covid in March.  Additional testing withheld. [KH]  0244 Case discussed with Dr. Rolena Infante of orthopedics.  Their service will plan to round on the patient in the morning. [KH]    Clinical Course User Index [KH] Antonietta Breach, PA-C   MDM Rules/Calculators/A&P                          87 year old female to be admitted for management of right hip pain.  Visualized to have a periprosthetic proximal femur fracture on the right after a fall 1 week ago.  She is neurovascularly intact.  Plan for orthopedic consultation in the morning to determine best plan of care.  Will be admitted by the hospitalist service.   Final Clinical Impression(s) / ED Diagnoses Final diagnoses:  Periprosthetic fracture of proximal end of femur     Rx / DC Orders ED Discharge Orders    None       Antonietta Breach, PA-C 01/23/21 [place]    Merrily Pew, MD 01/23/21 [phone]

## 2021-01-23 NOTE — Plan of Care (Signed)
  Problem: Education: Goal: Knowledge of General Education information will improve Description: Including pain rating scale, medication(s)/side effects and non-pharmacologic comfort measures Outcome: Progressing   Problem: Health Behavior/Discharge Planning: Goal: Ability to manage health-related needs will improve Outcome: Progressing   Problem: Health Behavior/Discharge Planning: Goal: Ability to manage health-related needs will improve Outcome: Progressing   Problem: Clinical Measurements: Goal: Ability to maintain clinical measurements within normal limits will improve Outcome: Progressing Goal: Will remain free from infection Outcome: Progressing Goal: Diagnostic test results will improve Outcome: Progressing Goal: Respiratory complications will improve Outcome: Progressing Goal: Cardiovascular complication will be avoided Outcome: Progressing   Problem: Activity: Goal: Risk for activity intolerance will decrease Outcome: Progressing   Problem: Nutrition: Goal: Adequate nutrition will be maintained Outcome: Progressing   Problem: Coping: Goal: Level of anxiety will decrease Outcome: Progressing   Problem: Elimination: Goal: Will not experience complications related to bowel motility Outcome: Progressing Goal: Will not experience complications related to urinary retention Outcome: Progressing   Problem: Pain Managment: Goal: General experience of comfort will improve Outcome: Progressing

## 2021-01-23 NOTE — Progress Notes (Addendum)
PROGRESS NOTE  Patricia [name]  DOB: 12-02-1931  PCP: [name], MD QG:[id number]  DOA: 01/23/2021  LOS: 0 days   Chief Complaint  Patient presents with  . Hip Pain  . Neck Pain   Brief narrative: Patricia [name] is a 87 y.o. female with PMH significant for renal transplant (2009),  CKD 4, dementia, DM2, HTN, HLD, CAD, diastolic CHF, hypothyroidism, prior DVT on chronic Eliquis, GERD, recent Covid infection in early March. Patient presented to the ED on 4/11 with complaint of worsening right leg and hip pain after a fall at home when she tripped on her walker a week ago on 4/4. 4/4, patient presented to the ED.  Underwent CT scan which raised the concern of preprostatic fracture of the right hip.  Per the ED note at that time, patient was comfortable and was able to continue walking with a walker.  She was discharged from the ED with a recommendation to follow-up with orthopedics as an outpatient. Due to persistent pain and inability to ambulate, patient was brought back to ED again on 4/11. ED physician discussed the case with orthopedic Dr. Rolena Infante who suggested inpatient admission and management Patient was admitted to hospitalist service.  Subjective: Patient was seen and examined this morning.  Pleasant elderly Caucasian female.  Propped up in bed.  Taking her breakfast. Chart reviewed No fever, blood pressure elevated up to 199/71 this morning.  Assessment/Plan: Periprosthetic fracture around internal prosthetic right hip joint -Seen by orthopedics Dr. Lyla Glassing this afternoon.  Recommended nonoperative treatment with touchdown weightbearing with a walker.  No active abduction for 6 weeks.  Follow-up repeat x-ray in 4 weeks.  -Continue pain control.   History of renal transplant 2009 CKD4 of transplanted kidney -Creatinine currently at baseline.  Continue to monitor. -Continue suppression with azathioprine 50 mg twice daily, cyclosporine 100 mg twice daily, Bactrim 3 times  a week. Recent Labs    04/11/20 1033 04/12/20 0255 04/13/20 0903 04/19/20 0000 04/26/20 0000 12/19/20 1140 12/20/20 0721 12/21/20 0110 12/22/20 0225 01/23/21 0222  BUN 17 23 32* 28* 22* 51* 46* 50* 54* 37*  CREATININE 1.83* 1.67* 2.15* 1.4* 1.4* 2.16* 1.87* 1.62* 1.92* 1.92*   Type 2 diabetes mellitus -A1c 5.3 on 4/11. -Continue sliding scale insulin with Accu-Cheks. Recent Labs  Lab 01/23/21 0924 01/23/21 1150  GLUCAP 110* 115*   Cardiovascular issues: HTN, HLD, CAD, diastolic CHF -Home meds include metoprolol 25 mg twice daily and amlodipine 5 mg daily.  Not on diuretics -Continue both.  Continue to monitor blood pressure. -Continue statin  History of DVT chronically on Eliquis -Eliquis was held on admission in anticipation of surgery.  Since there is no plan of surgical intervention, I will resume Eliquis.  Dementia -Supportive care.  COPD Recent Covid infection -Stable respiratory status.  Continue bronchodilators.  Hypothyroidism -Continue Synthroid.  GERD -Continue PPI and Pepcid.  ?Urinary incontinence Oxybutynin 10 mg daily, Myrbetriq 50 mg daily,  Depression Prozac 20 mg daily, Remeron 7.5 mg at bedtime,  Mobility: PT eval postprocedure Code Status:   Code Status: DNR  Nutritional status: Body mass index is 20.49 kg/m. Nutrition Problem: Severe Malnutrition Etiology: chronic illness Signs/Symptoms: severe fat depletion,severe muscle depletion,energy intake < or equal to 75% for > or equal to 1 month Diet Order            DIET DYS 2 Room service appropriate? Yes; Fluid consistency: Thin  Diet effective now  DVT prophylaxis: apixaban (ELIQUIS) tablet 2.5 mg Start: 01/23/21 1545 SCDs Start: 01/23/21 0719   Antimicrobials:  None Fluid: Currently on LR at 100 mill per hour.  Will reduce to 50 mill per hour. Consultants: Orthopedics Family Communication:  None at bedside  Status is: Observation  The patient will require  care spanning > 2 midnights and should be moved to inpatient because: Patient needs physical therapy, probably needs placement.  Dispo: The patient is from: Home              Anticipated d/c is to: Home versus SNF, pending PT eval               Patient currently is not medically stable to d/c.   Difficult to place patient No       Infusions:  . lactated ringers 50 mL/hr at 01/23/21 1453    Scheduled Meds: . acetaminophen  650 mg Oral BID  . amLODipine  5 mg Oral Daily  . apixaban  2.5 mg Oral BID  . azaTHIOprine  50 mg Oral BID  . cycloSPORINE modified  100 mg Oral BID  . docusate sodium  100 mg Oral BID  . famotidine  20 mg Oral Q supper  . feeding supplement  237 mL Oral TID BM  . [START ON 01/24/2021] ferrous sulfate  325 mg Oral Q breakfast  . FLUoxetine  20 mg Oral Daily  . fluticasone furoate-vilanterol  1 puff Inhalation Daily  . insulin aspart  0-15 Units Subcutaneous TID AC & HS  . insulin lispro  8 Units Subcutaneous Daily  . levothyroxine  75 mcg Oral Q0600  . metoCLOPramide  5 mg Oral QID  . metoprolol tartrate  25 mg Oral BID  . mirabegron ER  50 mg Oral QPM  . mirtazapine  7.5 mg Oral QHS  . multivitamin with minerals  1 tablet Oral Daily  . oxybutynin  10 mg Oral QHS  . pantoprazole  40 mg Oral Daily  . pravastatin  40 mg Oral Daily  . predniSONE  5 mg Oral Q breakfast  . sulfamethoxazole-trimethoprim  1 tablet Oral Q M,W,F    Antimicrobials: Anti-infectives (From admission, onward)   Start     Dose/Rate Route Frequency Ordered Stop   01/23/21 1000  sulfamethoxazole-trimethoprim (BACTRIM) 400-80 MG per tablet 1 tablet        1 tablet Oral Every M-W-F 01/23/21 0718        PRN meds: acetaminophen **OR** acetaminophen, albuterol, bisacodyl, fentaNYL (SUBLIMAZE) injection **OR** fentaNYL (SUBLIMAZE) injection, fluticasone, loratadine, polyethylene glycol   Objective: Vitals:   01/23/21 1207 01/23/21 1309  BP:  (!) 164/75  Pulse:  82  Resp:  16   Temp:  97.9 F (36.6 C)  SpO2: 99% 100%    Intake/Output Summary (Last 24 hours) at 01/23/2021 1500 Last data filed at 01/23/2021 1300 Gross per 24 hour  Intake 335 ml  Output --  Net 335 ml   Filed Weights   01/23/21 0120  Weight: 49.2 kg   Weight change:  Body mass index is 20.49 kg/m.   Physical Exam: General exam: Pleasant elderly Caucasian female.  Not in distress Skin: No rashes, lesions or ulcers. HEENT: Atraumatic, normocephalic, no obvious bleeding Lungs: Clear to auscultation bilaterally CVS: Regular rate and rhythm, no murmur GI/Abd soft, nontender, nondistended, bowel sound present CNS: Alert, awake, oriented to place. Psychiatry: Cheerful demented Extremities: No pedal edema, no calf tenderness  Data Review: I have personally reviewed the laboratory data and  studies available.  Recent Labs  Lab 01/23/21 0222  WBC 7.5  NEUTROABS 4.5  HGB 10.5*  HCT 32.0*  MCV 96.1  PLT 347   Recent Labs  Lab 01/23/21 0222  NA 136  K 4.4  CL 106  CO2 19*  GLUCOSE 115*  BUN 37*  CREATININE 1.92*  CALCIUM 9.8    F/u labs ordered Unresulted Labs (From admission, onward)          Start     Ordered   01/24/21 0500  Comprehensive metabolic panel  Tomorrow morning,   R        01/23/21 0718   01/24/21 0500  Magnesium  Tomorrow morning,   R        01/23/21 0718   01/24/21 0500  CBC WITH DIFFERENTIAL  Tomorrow morning,   R        01/23/21 0718   01/24/21 0500  APTT  Tomorrow morning,   R        01/23/21 0718   01/24/21 0500  Protime-INR  Tomorrow morning,   R        01/23/21 0718          Signed, Terrilee Croak, MD [facility] 01/23/2021

## 2021-01-23 NOTE — ED Notes (Signed)
Messaged Dr.Dahal to ask for more pain medication for pt, await response.

## 2021-01-23 NOTE — ED Notes (Signed)
Purewick has been placed.  

## 2021-01-23 NOTE — ED Notes (Signed)
Issue with bed placement, Charge Nurse made aware.

## 2021-01-23 NOTE — H&P (Signed)
History and Physical    Patricia [name] [id number] DOB: [birth year] DOA: 01/23/2021  PCP: [name], MD  Patient coming from: Home   Chief Complaint:  Chief Complaint  Patient presents with  . Hip Pain  . Neck Pain     HPI: 87 year old female with past medical history of kidney transplant (2009), dementia, COPD, coronary disease, gastroesophageal reflux disease, hyperlipidemia, prior DVT on chronic Eliquis, diastolic congestive heart failure (Echo 2016 EF 60-65% with G2DD), hypothyroidism, chronic kidney disease stage IV, diabetes mellitus type 2, protein calorie malnutrition, hypertension and recent Covid infection in early March who presents to [facility] emergency department with right leg and hip pain.  Patient is an extremely poor historian due to known history of dementia.  Majority the history has been obtained from the husband who is at the bedside.  Husband explains that on 4/4 wife was walking to the kitchen using a walker when she suddenly tripped and fell.  Noted in severe pain and inability to get up the patient was therefore brought into [facility] long hospital at that time for evaluation.  During that evaluation x-ray and CT imaging was performed revealing concern for preprostatic fracture of the right hip.  Patient was discharged home and instructed to follow-up with orthopedic surgery in the outpatient setting.  Husband explains that since that day she has been experiencing progressively worsening severe right hip pain and has been completely unable to ambulate whatsoever.  Due to ongoing severe hip pain and inability to ambulate patient is now presented to [facility] emergency department again with the symptoms.  Upon repeat assessment here in the emergency department the case was discussed with Dr. Rolena Infante with orthopedic surgery who stated that orthopedics will come to evaluate the patient in the morning in consultation.  The hospitalist group was  then called to assess the patient admission the hospital for  Review of Systems:   ROS  Past Medical History:  Diagnosis Date  . Abnormal gait   . Anxiety   . Arthritis   . Asthma   . COPD, mild ([place])   . Coronary artery disease    pt denies  . Depression   . Diverticulitis   . Dizziness   . DVT (deep venous thrombosis) ([place])   . GERD (gastroesophageal reflux disease)   . Hypertension   . Hypothyroidism   . Multiple allergies   . Pneumonia   . Pre-diabetes   . Reflux   . Renal disorder    had kidney transplant in 2009    Past Surgical History:  Procedure Laterality Date  . ANTERIOR APPROACH HEMI HIP ARTHROPLASTY Right 02/10/2016   Procedure: TOTAL HIP ARTHROPLASTY ANTERIOR APPROACH;  Surgeon: Rod Can, MD;  Location: [place];  Service: Orthopedics;  Laterality: Right;  . CHOLECYSTECTOMY N/A 07/01/2018   Procedure: LAPAROSCOPIC CHOLECYSTECTOMY WITH INTRAOPERATIVE CHOLANGIOGRAM;  Surgeon: Jovita Kussmaul, MD;  Location: [place];  Service: General;  Laterality: N/A;  . COLONOSCOPY    . NEPHRECTOMY TRANSPLANTED ORGAN    . TUMOR REMOVAL     Brain      reports that she has never smoked. She has never used smokeless tobacco. She reports that she does not drink alcohol and does not use drugs.  Allergies  Allergen Reactions  . Macrolides And Ketolides Nausea Only  . Aricept [Donepezil Hcl] Nausea And Vomiting  . Codeine Nausea And Vomiting  . Morphine Nausea And Vomiting  . Penicillin G Nausea Only  . Penicillins Nausea And  Vomiting and Other (See Comments)    HEADACHE Has patient had a PCN reaction causing immediate rash, facial/tongue/throat swelling, SOB or lightheadedness with hypotension: unkn Has patient had a PCN reaction causing severe rash involving mucus membranes or skin necrosis: unkn Has patient had a PCN reaction that required hospitalization: unkn Has patient had a PCN reaction occurring within the last 10 years: unkn If all of the above answers are "NO",  then may proceed with Cephalosporin use.   . Statins Other (See Comments)    Doesn't remember  . Streptomycin Other (See Comments)    headache   . Tetracycline Nausea Only  . Tetracyclines & Related Rash    Family History  Problem Relation Age of Onset  . Alzheimer's disease Mother   . High Cholesterol Mother   . Alcoholism Father      Prior to Admission medications   Medication Sig Start Date End Date Taking? Authorizing Provider  oxybutynin (DITROPAN-XL) 10 MG 24 hr tablet Take 10 mg by mouth at bedtime.   Yes [provider]  acetaminophen (TYLENOL) 325 MG tablet Take 2 tablets (650 mg total) by mouth every 6 (six) hours as needed. 04/27/20   Ngetich, Dinah C, NP  albuterol (VENTOLIN HFA) 108 (90 Base) MCG/ACT inhaler Inhale 2 puffs into the lungs every 6 (six) hours as needed for wheezing or shortness of breath. 01/12/21   Medina-Vargas, Monina C, NP  amLODipine (NORVASC) 5 MG tablet Take 1 tablet (5 mg total) by mouth daily. 01/12/21   Medina-Vargas, Monina C, NP  apixaban (ELIQUIS) 2.5 MG TABS tablet Take 1 tablet (2.5 mg total) by mouth 2 (two) times daily. 01/12/21   Medina-Vargas, Monina C, NP  azaTHIOprine (IMURAN) 50 MG tablet Take 1 tablet (50 mg total) by mouth 2 (two) times daily. 01/12/21   Medina-Vargas, Monina C, NP  bisacodyl (DULCOLAX) 5 MG EC tablet Take 1 tablet (5 mg total) by mouth daily as needed for moderate constipation. 01/12/21   Medina-Vargas, Monina C, NP  budesonide-formoterol (SYMBICORT) 160-4.5 MCG/ACT inhaler Inhale 2 puffs into the lungs in the morning and at bedtime. 01/12/21   Medina-Vargas, Monina C, NP  Cholecalciferol (VITAMIN D) 50 MCG (2000 UT) tablet Take 1 tablet (2,000 Units total) by mouth every evening. 01/12/21   Medina-Vargas, Monina C, NP  cycloSPORINE modified (NEORAL) 100 MG capsule Take 1 capsule (100 mg total) by mouth 2 (two) times daily. 01/12/21   Medina-Vargas, Monina C, NP  docusate sodium (COLACE) 100 MG capsule Take 1 capsule  (100 mg total) by mouth 2 (two) times daily. 01/12/21   Medina-Vargas, Monina C, NP  famotidine (PEPCID) 20 MG tablet Take 1 tablet (20 mg total) by mouth daily with supper. 01/12/21   Medina-Vargas, Monina C, NP  ferrous sulfate 325 (65 FE) MG tablet Take 1 tablet (325 mg total) by mouth daily with breakfast. 01/12/21   Medina-Vargas, Monina C, NP  FLUoxetine (PROZAC) 20 MG tablet Take 1 tablet (20 mg total) by mouth daily. 01/12/21   Medina-Vargas, Monina C, NP  fluticasone (FLONASE) 50 MCG/ACT nasal spray Place 1 spray into both nostrils daily as needed for allergies or rhinitis. 01/12/21   Medina-Vargas, Monina C, NP  Glucerna (GLUCERNA) LIQD Take 237 mLs by mouth 2 (two) times daily. 01/12/21   Medina-Vargas, Monina C, NP  HYDROcodone-acetaminophen (NORCO) 5-325 MG tablet Take 1 tablet by mouth every 4 (four) hours as needed for moderate pain. 01/16/21   Daleen Bo, MD  insulin lispro (HUMALOG) 100 UNIT/ML KwikPen  Inject 8 Units into the skin daily. 01/12/21   Medina-Vargas, Monina C, NP  levothyroxine (SYNTHROID) 75 MCG tablet Take 1 tablet (75 mcg total) by mouth daily. 01/12/21   Medina-Vargas, Monina C, NP  loratadine (CLARITIN) 10 MG tablet Take 1 tablet (10 mg total) by mouth daily as needed for allergies. 01/12/21   Medina-Vargas, Monina C, NP  Lysine 500 MG TABS Take 1 tablet (500 mg total) by mouth daily. 01/12/21   Medina-Vargas, Monina C, NP  magnesium hydroxide (MILK OF MAGNESIA) 400 MG/5ML suspension Take by mouth daily as needed for mild constipation.    [provider]  metoCLOPramide (REGLAN) 5 MG tablet Take 1 tablet (5 mg total) by mouth 4 (four) times daily. 01/12/21   Medina-Vargas, Monina C, NP  metoprolol tartrate (LOPRESSOR) 25 MG tablet Take 1 tablet (25 mg total) by mouth 2 (two) times daily. 01/12/21   Medina-Vargas, Monina C, NP  mirabegron ER (MYRBETRIQ) 50 MG TB24 tablet Take 1 tablet (50 mg total) by mouth every evening. 01/12/21   Medina-Vargas, Monina C, NP   mirtazapine (REMERON) 15 MG tablet Take 0.5 tablets (7.5 mg total) by mouth at bedtime. 01/12/21   Medina-Vargas, Monina C, NP  Multiple Vitamins-Minerals (PRESERVISION/LUTEIN PO) Take 1 capsule by mouth daily.    [provider]  NON FORMULARY Magic cup twice daily to promote weight gain    [provider]  omeprazole (PRILOSEC) 20 MG capsule Take 1 capsule (20 mg total) by mouth daily. 01/12/21   Medina-Vargas, Monina C, NP  ondansetron (ZOFRAN) 4 MG tablet Take 1 tablet (4 mg total) by mouth every 4 (four) hours as needed. 04/27/20   Ngetich, Dinah C, NP  phenylephrine-shark liver oil-mineral oil-petrolatum (PREPARATION H) 0.25-3-14-71.9 % rectal ointment Place 1 application rectally 2 (two) times daily as needed for hemorrhoids. 04/27/20   Ngetich, Dinah C, NP  polyethylene glycol (MIRALAX / GLYCOLAX) 17 g packet Take 17 g by mouth daily as needed for mild constipation. 04/27/20   Ngetich, Dinah C, NP  pravastatin (PRAVACHOL) 40 MG tablet Take 1 tablet (40 mg total) by mouth daily. 01/12/21   Medina-Vargas, Monina C, NP  predniSONE (DELTASONE) 5 MG tablet Take 1 tablet (5 mg total) by mouth daily with breakfast. 01/12/21   Medina-Vargas, Monina C, NP  sulfamethoxazole-trimethoprim (BACTRIM) 400-80 MG tablet Take 1 tablet by mouth every Monday, Wednesday, and Friday. 01/13/21   Medina-Vargas, Jaymes Graff C, NP    Physical Exam: Vitals:   01/23/21 0500 01/23/21 0515 01/23/21 0530 01/23/21 0545  BP: (!) 142/71 140/62 (!) 147/67 (!) 158/71  Pulse: 81 79 79 80  Resp: (!) 21 (!) 24 (!) 22 (!) 22  Temp:      TempSrc:      SpO2: 100% 98% 100% 99%  Weight:      Height:        Constitutional: Awake alert and oriented x2, patient is in distress due to pain.   Skin: Extremely poor skin turgor noted with multiple ecchymoses noted over the surfaces of all extremities.  No other rashes or lesions seen.   Eyes: Pupils are equally reactive to light.  No evidence of scleral icterus or conjunctival  pallor.  ENMT: Extremely dry mucous membranes noted.  Posterior pharynx clear of any exudate or lesions.   Neck: normal, supple, no masses, no thyromegaly.  No evidence of jugular venous distension.   Respiratory: clear to auscultation bilaterally, no wheezing, no crackles. Normal respiratory effort. No accessory muscle use.  Cardiovascular: Regular  rate and rhythm, no murmurs / rubs / gallops. No extremity edema. 2+ pedal pulses. No carotid bruits.  Chest:   Nontender without crepitus or deformity.   Back:   Nontender without crepitus or deformity. Abdomen: Abdomen is soft and nontender.  No evidence of intra-abdominal masses.  Positive bowel sounds noted in all quadrants.   Musculoskeletal: Severe right hip pain with both passive and active range of motion.  No visible deformity noted however.  Poor muscle tone noted overall.  No significant contractures noted. Neurologic: Patient is awake and alert but only oriented x2.  Patient is responsive to verbal and painful stimuli.  Patient does follow commands.  Is moving all 4 extremities but movement of the right lower extremity is limited due to pain.   Psychiatric: Patient exhibits normal mood with appropriate affect.  Patient currently does not seem to possess insight as to her current situation.  Labs on Admission: I have personally reviewed following labs and imaging studies -   CBC: Recent Labs  Lab 01/23/21 0222  WBC 7.5  NEUTROABS 4.5  HGB 10.5*  HCT 32.0*  MCV 96.1  PLT [id number]   Basic Metabolic Panel: Recent Labs  Lab 01/23/21 0222  NA 136  K 4.4  CL 106  CO2 19*  GLUCOSE 115*  BUN 37*  CREATININE 1.92*  CALCIUM 9.8   GFR: Estimated Creatinine Clearance: 15.9 mL/min (A) (by C-G formula based on SCr of 1.92 mg/dL (H)). Liver Function Tests: No results for input(s): AST, ALT, ALKPHOS, BILITOT, PROT, ALBUMIN in the last 168 hours. No results for input(s): LIPASE, AMYLASE in the last 168 hours. No results for input(s):  AMMONIA in the last 168 hours. Coagulation Profile: No results for input(s): INR, PROTIME in the last 168 hours. Cardiac Enzymes: No results for input(s): CKTOTAL, CKMB, CKMBINDEX, TROPONINI in the last 168 hours. BNP (last 3 results) No results for input(s): PROBNP in the last 8760 hours. HbA1C: No results for input(s): HGBA1C in the last 72 hours. CBG: No results for input(s): GLUCAP in the last 168 hours. Lipid Profile: No results for input(s): CHOL, HDL, LDLCALC, TRIG, CHOLHDL, LDLDIRECT in the last 72 hours. Thyroid Function Tests: No results for input(s): TSH, T4TOTAL, FREET4, T3FREE, THYROIDAB in the last 72 hours. Anemia Panel: No results for input(s): VITAMINB12, FOLATE, FERRITIN, TIBC, IRON, RETICCTPCT in the last 72 hours. Urine analysis:    Component Value Date/Time   COLORURINE YELLOW 12/19/2020 1627   APPEARANCEUR HAZY (A) 12/19/2020 1627   LABSPEC 1.013 12/19/2020 1627   PHURINE 5.0 12/19/2020 1627   GLUCOSEU NEGATIVE 12/19/2020 1627   HGBUR SMALL (A) 12/19/2020 1627   BILIRUBINUR NEGATIVE 12/19/2020 1627   KETONESUR NEGATIVE 12/19/2020 1627   PROTEINUR 100 (A) 12/19/2020 1627   UROBILINOGEN 0.2 08/20/2015 1310   NITRITE NEGATIVE 12/19/2020 1627   LEUKOCYTESUR SMALL (A) 12/19/2020 1627    Radiological Exams on Admission - Personally Reviewed: No results found.   Assessment/Plan Principal Problem:   Periprosthetic fracture around internal prosthetic right hip joint, initial encounter [facility])   ER provider discussed case with Dr. Rolena Infante orthopedic surgery who stated that orthopedic surgery will consult on the patient this morning.  Their input is appreciated.  Low-dose as needed opiate-based analgesics for substantial associated pain  Home regimen of Eliquis has been held in preparation for potential operative intervention.  Last dose of Eliquis was on 4/10.  Active Problems:   Hypothyroidism   Continue home regimen of Synthroid    COPD (chronic  obstructive  pulmonary disease) ([place])   No evidence of COPD exacerbation this time  Continue home regimen of maintenance inhalers  As needed bronchodilator therapy for episodic shortness of breath and wheezing.    Controlled type 2 diabetes mellitus with stage 4 chronic kidney disease, with long-term current use of insulin ([place])  . Patient been placed on Accu-Cheks before every meal and nightly with sliding scale insulin . Diabetic Diet    Chronic kidney disease, stage 4, severely decreased GFR ([place])  . Strict intake and output monitoring . Creatinine near baseline . Minimizing nephrotoxic agents as much as possible . Serial chemistries to monitor renal function and electrolytes    Essential hypertension  . Resume patients home regimen or oral antihypertensives . Titrate antihypertensive regimen as necessary to achieve adequate BP control . PRN intravenous antihypertensives for excessively elevated blood pressure     Chronic diastolic CHF (congestive heart failure) ([facility])   No evidence of cardiogenic volume overload at this time.    Mixed diabetic hyperlipidemia associated with type 2 diabetes mellitus ([place])  . Continuing home regimen of lipid lowering therapy.    Severe protein-calorie malnutrition ([place])   Patient exhibits poor muscle tone and longstanding poor oral intake consistent with severe protein calorie malnutrition  Nutrition consultation requested, input is appreciated.    GERD without esophagitis  . Continuing home regimen of daily PPI therapy.    History of renal transplant   Continue home regimen of azathioprine and cyclosporine   Code Status:  DNR Family Communication: Husband is at bedside who has been updated on plan of care  Status is: Observation  The patient remains OBS appropriate and will d/c before 2 midnights.  Dispo: The patient is from: Home              Anticipated d/c is to: SNF              Patient currently is not medically  stable to d/c.   Difficult to place patient Yes        Vernelle Emerald MD [facility] Pager [phone]  If 7PM-7AM, please contact night-coverage [email or web address] Use universal [place] password for that web site. If you do not have the password, please call the hospital operator.  01/23/2021, 7:18 AM

## 2021-01-23 NOTE — ED Notes (Signed)
Called 6E to [place], she will accept pt shortly

## 2021-01-23 NOTE — Progress Notes (Signed)
Initial Nutrition Assessment  DOCUMENTATION CODES:   Severe malnutrition in context of chronic illness  INTERVENTION:   -Ensure Enlive po TID, each supplement provides 350 kcal and 20 grams of protein  -Magic cup TID with meals, each supplement provides 290 kcal and 9 grams of protein  -Multivitamin with minerals daily  NUTRITION DIAGNOSIS:   Severe Malnutrition related to chronic illness as evidenced by severe fat depletion,severe muscle depletion,energy intake < or equal to 75% for > or equal to 1 month.  GOAL:   Patient will meet greater than or equal to 90% of their needs  MONITOR:   PO intake,Supplement acceptance,Labs,Weight trends,I & O's  REASON FOR ASSESSMENT:   Consult Assessment of nutrition requirement/status  ASSESSMENT:   87 year old female with past medical history of kidney transplant (2009), dementia, COPD, coronary disease, gastroesophageal reflux disease, hyperlipidemia, prior DVT on chronic Eliquis, diastolic congestive heart failure (Echo 2016 EF 60-65% with G2DD), hypothyroidism, chronic kidney disease stage IV, diabetes mellitus type 2, protein calorie malnutrition, hypertension and recent Covid infection in early March who presents to [facility] emergency department with right leg and hip pain.  Patient currently consuming 10% of meals at this time. Pt with history of COVID-19 in March during which she was hospitalized and was not eating well. Was diagnosed with severe malnutrition at that time, this continues. Pt also declined feeding tube at that time.  Ortho to see pt for periprosthetic fracture. Plan pending.  Will order Ensure and Magic cup supplements while still on a diet.  Per weight records, pt has lost 8 lbs since last April.   Medications: Colace, Reglan, Remeron, Lactated ringers  Labs reviewed:  CBGs: 110-115   NUTRITION - FOCUSED PHYSICAL EXAM:  Flowsheet Row Most Recent Value  Orbital Region Severe depletion  Upper  Arm Region Severe depletion  Thoracic and Lumbar Region Unable to assess  Buccal Region Severe depletion  Temple Region Severe depletion  Clavicle Bone Region Severe depletion  Clavicle and Acromion Bone Region Severe depletion  Scapular Bone Region Severe depletion  Dorsal Hand Severe depletion  Patellar Region Unable to assess  Anterior Thigh Region Unable to assess  Posterior Calf Region Unable to assess  Edema (RD Assessment) None       Diet Order:   Diet Order            DIET DYS 2 Room service appropriate? Yes; Fluid consistency: Thin  Diet effective now                 EDUCATION NEEDS:   No education needs have been identified at this time  Skin:  Skin Assessment: Reviewed RN Assessment  Last BM:  4/11  Height:   Ht Readings from Last 1 Encounters:  01/23/21 '5\' 1"'$  (1.549 m)    Weight:   Wt Readings from Last 1 Encounters:  01/23/21 49.2 kg   BMI:  Body mass index is 20.49 kg/m.  Estimated Nutritional Needs:   Kcal:  1450-1650  Protein:  70-85g  Fluid:  1.6L/day  Clayton Bibles, MS, RD, LDN Inpatient Clinical Dietitian Contact information available via Amion

## 2021-01-23 NOTE — ED Triage Notes (Signed)
Patient comes from home complaining of right leg and right hip pain. This is chronic issue. Patient did not have an accident.

## 2021-01-23 NOTE — Progress Notes (Signed)
Pt seen and examined. Imaging reviewed. Patient has R [place] [employer] periprosthetic femur fracture. Nonoperative treatment, TDWB RLE with walker. No active abduction for 6 weeks. F/u 4 weeks for repeat x-rays. Full note to follow.

## 2021-01-23 NOTE — ED Notes (Addendum)
Pt a&ox2, RR even/unlabored, on room air, NADN, c/o 10/10 right leg/hip pain, s/p fall x1 week ago.  Side rails up x2, call light in reach, warm blanket provided.

## 2021-01-24 DIAGNOSIS — Z880 Allergy status to penicillin: Secondary | ICD-10-CM | POA: Diagnosis not present

## 2021-01-24 DIAGNOSIS — J449 Chronic obstructive pulmonary disease, unspecified: Secondary | ICD-10-CM | POA: Diagnosis present

## 2021-01-24 DIAGNOSIS — K219 Gastro-esophageal reflux disease without esophagitis: Secondary | ICD-10-CM | POA: Diagnosis present

## 2021-01-24 DIAGNOSIS — W010XXA Fall on same level from slipping, tripping and stumbling without subsequent striking against object, initial encounter: Secondary | ICD-10-CM | POA: Diagnosis present

## 2021-01-24 DIAGNOSIS — Z888 Allergy status to other drugs, medicaments and biological substances status: Secondary | ICD-10-CM | POA: Diagnosis not present

## 2021-01-24 DIAGNOSIS — E1169 Type 2 diabetes mellitus with other specified complication: Secondary | ICD-10-CM | POA: Diagnosis present

## 2021-01-24 DIAGNOSIS — S72001A Fracture of unspecified part of neck of right femur, initial encounter for closed fracture: Secondary | ICD-10-CM | POA: Diagnosis present

## 2021-01-24 DIAGNOSIS — Z940 Kidney transplant status: Secondary | ICD-10-CM | POA: Diagnosis not present

## 2021-01-24 DIAGNOSIS — E039 Hypothyroidism, unspecified: Secondary | ICD-10-CM | POA: Diagnosis present

## 2021-01-24 DIAGNOSIS — M9701XA Periprosthetic fracture around internal prosthetic right hip joint, initial encounter: Secondary | ICD-10-CM | POA: Diagnosis not present

## 2021-01-24 DIAGNOSIS — Z86718 Personal history of other venous thrombosis and embolism: Secondary | ICD-10-CM | POA: Diagnosis not present

## 2021-01-24 DIAGNOSIS — Z885 Allergy status to narcotic agent status: Secondary | ICD-10-CM | POA: Diagnosis not present

## 2021-01-24 DIAGNOSIS — F32A Depression, unspecified: Secondary | ICD-10-CM | POA: Diagnosis present

## 2021-01-24 DIAGNOSIS — I5032 Chronic diastolic (congestive) heart failure: Secondary | ICD-10-CM | POA: Diagnosis present

## 2021-01-24 DIAGNOSIS — E785 Hyperlipidemia, unspecified: Secondary | ICD-10-CM | POA: Diagnosis present

## 2021-01-24 DIAGNOSIS — Z881 Allergy status to other antibiotic agents status: Secondary | ICD-10-CM | POA: Diagnosis not present

## 2021-01-24 DIAGNOSIS — Y92009 Unspecified place in unspecified non-institutional (private) residence as the place of occurrence of the external cause: Secondary | ICD-10-CM | POA: Diagnosis not present

## 2021-01-24 DIAGNOSIS — E1122 Type 2 diabetes mellitus with diabetic chronic kidney disease: Secondary | ICD-10-CM | POA: Diagnosis present

## 2021-01-24 DIAGNOSIS — Z8616 Personal history of COVID-19: Secondary | ICD-10-CM | POA: Diagnosis not present

## 2021-01-24 DIAGNOSIS — N184 Chronic kidney disease, stage 4 (severe): Secondary | ICD-10-CM | POA: Diagnosis present

## 2021-01-24 DIAGNOSIS — E43 Unspecified severe protein-calorie malnutrition: Secondary | ICD-10-CM | POA: Diagnosis present

## 2021-01-24 DIAGNOSIS — I2510 Atherosclerotic heart disease of native coronary artery without angina pectoris: Secondary | ICD-10-CM | POA: Diagnosis present

## 2021-01-24 DIAGNOSIS — Z6820 Body mass index (BMI) 20.0-20.9, adult: Secondary | ICD-10-CM | POA: Diagnosis not present

## 2021-01-24 DIAGNOSIS — F0390 Unspecified dementia without behavioral disturbance: Secondary | ICD-10-CM | POA: Diagnosis present

## 2021-01-24 DIAGNOSIS — Z66 Do not resuscitate: Secondary | ICD-10-CM | POA: Diagnosis present

## 2021-01-24 DIAGNOSIS — I130 Hypertensive heart and chronic kidney disease with heart failure and stage 1 through stage 4 chronic kidney disease, or unspecified chronic kidney disease: Secondary | ICD-10-CM | POA: Diagnosis present

## 2021-01-24 LAB — COMPREHENSIVE METABOLIC PANEL
ALT: 10 U/L (ref 0–44)
AST: 20 U/L (ref 15–41)
Albumin: 2.5 g/dL — ABNORMAL LOW (ref 3.5–5.0)
Alkaline Phosphatase: 57 U/L (ref 38–126)
Anion gap: 7 (ref 5–15)
BUN: 36 mg/dL — ABNORMAL HIGH (ref 8–23)
CO2: 23 mmol/L (ref 22–32)
Calcium: 9.2 mg/dL (ref 8.9–10.3)
Chloride: 106 mmol/L (ref 98–111)
Creatinine, Ser: 1.65 mg/dL — ABNORMAL HIGH (ref 0.44–1.00)
GFR, Estimated: 30 mL/min — ABNORMAL LOW (ref >60)
Glucose, Bld: 125 mg/dL — ABNORMAL HIGH (ref 70–99)
Potassium: 4.3 mmol/L (ref 3.5–5.1)
Sodium: 136 mmol/L (ref 135–145)
Total Bilirubin: 0.5 mg/dL (ref 0.3–1.2)
Total Protein: 5.7 g/dL — ABNORMAL LOW (ref 6.5–8.1)

## 2021-01-24 LAB — CBC WITH DIFFERENTIAL/PLATELET
Abs Immature Granulocytes: 0.01 10*3/uL (ref 0.00–0.07)
Basophils Absolute: 0 10*3/uL (ref 0.0–0.1)
Basophils Relative: 0 %
Eosinophils Absolute: 0.2 10*3/uL (ref 0.0–0.5)
Eosinophils Relative: 3 %
HCT: 29.1 % — ABNORMAL LOW (ref 36.0–46.0)
Hemoglobin: 9.1 g/dL — ABNORMAL LOW (ref 12.0–15.0)
Immature Granulocytes: 0 %
Lymphocytes Relative: 34 %
Lymphs Abs: 1.7 10*3/uL (ref 0.7–4.0)
MCH: 31.6 pg (ref 26.0–34.0)
MCHC: 31.3 g/dL (ref 30.0–36.0)
MCV: 101 fL — ABNORMAL HIGH (ref 80.0–100.0)
Monocytes Absolute: 0.5 10*3/uL (ref 0.1–1.0)
Monocytes Relative: 10 %
Neutro Abs: 2.7 10*3/uL (ref 1.7–7.7)
Neutrophils Relative %: 53 %
Platelets: 256 10*3/uL (ref 150–400)
RBC: 2.88 MIL/uL — ABNORMAL LOW (ref 3.87–5.11)
RDW: 14.7 % (ref 11.5–15.5)
WBC: 5.2 10*3/uL (ref 4.0–10.5)
nRBC: 0 % (ref 0.0–0.2)

## 2021-01-24 LAB — GLUCOSE, CAPILLARY
Glucose-Capillary: 97 mg/dL (ref 70–99)
Glucose-Capillary: 145 mg/dL — ABNORMAL HIGH (ref 70–99)
Glucose-Capillary: 153 mg/dL — ABNORMAL HIGH (ref 70–99)
Glucose-Capillary: 161 mg/dL — ABNORMAL HIGH (ref 70–99)

## 2021-01-24 LAB — APTT: aPTT: 34 seconds (ref 24–36)

## 2021-01-24 LAB — PROTIME-INR
INR: 1.2 (ref 0.8–1.2)
Prothrombin Time: 14.5 seconds (ref 11.4–15.2)

## 2021-01-24 LAB — MAGNESIUM: Magnesium: 1.8 mg/dL (ref 1.7–2.4)

## 2021-01-24 NOTE — Evaluation (Signed)
Physical Therapy Evaluation Patient Details Name: Patricia [name] MRN: [id number] DOB: February 08, 1932 Today's Date: 01/24/2021   History of Present Illness  87 y.o. female admitted 01/23/21 for right periprosthetic femur fracture. Per ortho: "Nonoperative treatment, TDWB RLE with walker. No active abduction for 6 weeks"  PMH significant for renal transplant (2009),  CKD 4, dementia, DM2, HTN, HLD, CAD, diastolic CHF, hypothyroidism, prior DVT on chronic Eliquis, GERD, recent Covid infection in early March.  Clinical Impression  Pt admitted with above diagnosis.  Pt currently with functional limitations due to the deficits listed below (see PT Problem List). Pt will benefit from skilled PT to increase their independence and safety with mobility to allow discharge to the venue listed below.  Pt requiring increased assist for bed mobility due to pain and unable to stand today.  Pt lives at home with spouse and son.  Pt would benefit from d/c to SNF for post acute rehab.     Follow Up Recommendations SNF    Equipment Recommendations  None recommended by PT    Recommendations for Other Services       Precautions / Restrictions Precautions Precautions: Fall Precaution Comments: No active abduction for 6 weeks Restrictions Weight Bearing Restrictions: Yes RLE Weight Bearing: Touchdown weight bearing      Mobility  Bed Mobility Overal bed mobility: Needs Assistance Bed Mobility: Supine to Sit;Sit to Supine     Supine to sit: Max assist Sit to supine: Max assist;+2 for safety/equipment   General bed mobility comments: pt semisidelying towards left with pillow between legs in flexion; assisted with moving LE by assisting pillow, pt assisting with UEs using bed rail and scooting hips towards EOB however requiring assist to complete; more assist back to bed for pain control and RN assisted    Transfers Overall transfer level: Needs assistance   Transfers: Sit to/from Stand Sit to Stand:  Total assist         General transfer comment: attempted however pt unable to bring buttocks off bed, also difficulty maintaining TDWB, reports pain  Ambulation/Gait                Stairs            Wheelchair Mobility    Modified Rankin (Stroke Patients Only)       Balance Overall balance assessment: History of Falls                                           Pertinent Vitals/Pain Pain Assessment: Faces Faces Pain Scale: Hurts even more Pain Location: right hip Pain Descriptors / Indicators: Sore;Grimacing;Guarding Pain Intervention(s): Repositioned;Monitored during session    Home Living Family/patient expects to be discharged to:: Private residence Living Arrangements: Spouse/significant other;Other (Comment) (son) Available Help at Discharge: Family;Available 24 hours/day Type of Home: House Home Access: Stairs to enter   [employer] of Steps: 5 Home Layout: Two level;Able to live on main level with bedroom/bathroom Home Equipment: Walker - 2 wheels Additional Comments: spouse present beginning of session and reports pt less mobile since fall, lives with spouse and son    Prior Function Level of Independence: Needs assistance   Gait / Transfers Assistance Needed: spouse reports pt was ambulatory short distances with RW however less mobile after fall  ADL's / Homemaking Assistance Needed: pt requires assist for bADLs and iADLs  Hand Dominance        Extremity/Trunk Assessment        Lower Extremity Assessment Lower Extremity Assessment: RLE deficits/detail RLE Deficits / Details: maintains flexed LE positioning throughout session RLE: Unable to fully assess due to pain       Communication   Communication: HOH  Cognition Arousal/Alertness: Awake/alert Behavior During Therapy: Flat affect                                   General Comments: increased time to process and perform  however following multimodal cues, dementia      General Comments      Exercises     Assessment/Plan    PT Assessment Patient needs continued PT services  PT Problem List Decreased strength;Decreased mobility;Decreased activity tolerance;Decreased balance;Decreased knowledge of use of DME;Decreased knowledge of precautions;Decreased cognition;Pain       PT Treatment Interventions DME instruction;Balance training;Therapeutic exercise;Gait training;Functional mobility training;Therapeutic activities;Patient/family education;Cognitive remediation    PT Goals (Current goals can be found in the Care Plan section)  Acute Rehab PT Goals Patient Stated Goal: pt agreeable to mobilizing PT Goal Formulation: Patient unable to participate in goal setting Time For Goal Achievement: 02/07/21 Potential to Achieve Goals: Good    Frequency Min 2X/week   Barriers to discharge        Co-evaluation               AM-PAC PT "6 Clicks" Mobility  Outcome Measure Help needed turning from your back to your side while in a flat bed without using bedrails?: Total Help needed moving from lying on your back to sitting on the side of a flat bed without using bedrails?: Total Help needed moving to and from a bed to a chair (including a wheelchair)?: Total Help needed standing up from a chair using your arms (e.g., wheelchair or bedside chair)?: Total Help needed to walk in hospital room?: Total Help needed climbing 3-5 steps with a railing? : Total 6 Click Score: 6    End of Session Equipment Utilized During Treatment: Gait belt Activity Tolerance: Patient tolerated treatment well Patient left: with call bell/phone within reach;in bed;with bed alarm set Nurse Communication: Mobility status;Precautions;Weight bearing status PT Visit Diagnosis: Difficulty in walking, not elsewhere classified (R26.2);History of falling (Z91.81)    Time: [id number] PT Time Calculation (min) (ACUTE ONLY): 26  min   Charges:   PT Evaluation $PT Eval Moderate Complexity: 1 Mod         Kati PT, DPT Acute Rehabilitation Services Pager: [phone] Office: [phone]    York Ram E 01/24/2021, 2:53 PM

## 2021-01-24 NOTE — Progress Notes (Signed)
PROGRESS NOTE  Patricia [name]  DOB: May 04, 1932  PCP: [name], MD [id number]  DOA: 01/23/2021  LOS: 0 days   Chief Complaint  Patient presents with  . Hip Pain  . Neck Pain   Brief narrative: Patricia [name] is a 87 y.o. female with PMH significant for renal transplant (2009),  CKD 4, dementia, DM2, HTN, HLD, CAD, diastolic CHF, hypothyroidism, prior DVT on chronic Eliquis, GERD, recent Covid infection in early March. Patient presented to the ED on 4/11 with complaint of worsening right leg and hip pain after a fall at home when she tripped on her walker a week ago on 4/4. 4/4, patient presented to the ED.  Underwent CT scan which raised the concern of preprostatic fracture of the right hip.  Per the ED note at that time, patient was comfortable and was able to continue walking with a walker.  She was discharged from the ED with a recommendation to follow-up with orthopedics as an outpatient. Due to persistent pain and inability to ambulate, patient was brought back to ED again on 4/11. ED physician discussed the case with orthopedic Dr. Rolena Infante who suggested inpatient admission and management Patient was admitted to hospitalist service. See below for details.  Subjective: Patient was seen and examined this morning.  Pleasant elderly Caucasian female.  Propped up in bed.  Not in distress at rest. Chart reviewed  Assessment/Plan: Periprosthetic fracture around internal prosthetic right hip joint -Seen by orthopedics Dr. Lyla Glassing this afternoon.  Recommended nonoperative treatment with touchdown weightbearing with a walker.  No active abduction for 6 weeks.  Follow-up repeat x-ray in 4 weeks.  -Continue pain control.  -Pending PT evaluation.  History of renal transplant 2009 CKD4 of transplanted kidney -Creatinine currently at baseline.  Continue to monitor. -Continue immunosuppression with azathioprine 50 mg twice daily, cyclosporine 100 mg twice daily, Bactrim 3 times a  week. Recent Labs    04/12/20 0255 04/13/20 0903 04/19/20 0000 04/26/20 0000 12/19/20 1140 12/20/20 0721 12/21/20 0110 12/22/20 0225 01/23/21 0222 01/24/21 0532  BUN 23 32* 28* 22* 51* 46* 50* 54* 37* 36*  CREATININE 1.67* 2.15* 1.4* 1.4* 2.16* 1.87* 1.62* 1.92* 1.92* 1.65*   Type 2 diabetes mellitus -A1c 5.3 on 4/11. -Continue sliding scale insulin with Accu-Cheks. Recent Labs  Lab 01/23/21 0924 01/23/21 1150 01/23/21 1707 01/23/21 2103 01/24/21 0747  GLUCAP 110* 115* 176* 137* 97   Cardiovascular issues: HTN, HLD, CAD, diastolic CHF -Home meds include metoprolol 25 mg twice daily and amlodipine 5 mg daily.  Not on diuretics -Continue both.  Continue to monitor blood pressure. -Continue statin  History of DVT chronically on Eliquis -Eliquis resumed. Dementia -Supportive care.  COPD Recent Covid infection -Stable respiratory status.  Continue bronchodilators.  Hypothyroidism -Continue Synthroid.  GERD -Continue PPI and Pepcid.  ?Urinary incontinence Oxybutynin 10 mg daily, Myrbetriq 50 mg daily,  Depression Prozac 20 mg daily, Remeron 7.5 mg at bedtime,  Mobility: PT eval pending Code Status:   Code Status: DNR  Nutritional status: Body mass index is 20.49 kg/m. Nutrition Problem: Severe Malnutrition Etiology: chronic illness Signs/Symptoms: severe fat depletion,severe muscle depletion,energy intake < or equal to 75% for > or equal to 1 month Diet Order            DIET DYS 2 Room service appropriate? Yes; Fluid consistency: Thin  Diet effective now                 DVT prophylaxis: apixaban (ELIQUIS) tablet 2.5 mg Start:  01/23/21 1700 SCDs Start: 01/23/21 0719   Antimicrobials:  None Fluid: We will stop IV fluid at this time. Consultants: Orthopedics Family Communication:  None at bedside  Status is: Observation  Dispo: The patient is from: Home              Anticipated d/c is to: Home versus SNF, pending PT eval               Patient  currently is not medically stable to d/c.   Difficult to place patient No       Infusions:    Scheduled Meds: . acetaminophen  650 mg Oral BID  . amLODipine  5 mg Oral Daily  . apixaban  2.5 mg Oral BID  . azaTHIOprine  50 mg Oral BID  . cycloSPORINE modified  100 mg Oral BID  . docusate sodium  100 mg Oral BID  . famotidine  20 mg Oral Q supper  . feeding supplement  237 mL Oral TID BM  . ferrous sulfate  325 mg Oral Q breakfast  . FLUoxetine  20 mg Oral Daily  . fluticasone furoate-vilanterol  1 puff Inhalation Daily  . insulin aspart  0-15 Units Subcutaneous TID AC & HS  . levothyroxine  75 mcg Oral Q0600  . metoCLOPramide  5 mg Oral TID AC & HS  . metoprolol tartrate  25 mg Oral BID  . mirabegron ER  50 mg Oral QPM  . multivitamin with minerals  1 tablet Oral Daily  . oxybutynin  10 mg Oral QHS  . pantoprazole  40 mg Oral Daily  . pravastatin  40 mg Oral Daily  . predniSONE  5 mg Oral Q breakfast  . sulfamethoxazole-trimethoprim  1 tablet Oral Q M,W,F    Antimicrobials: Anti-infectives (From admission, onward)   Start     Dose/Rate Route Frequency Ordered Stop   01/23/21 1000  sulfamethoxazole-trimethoprim (BACTRIM) 400-80 MG per tablet 1 tablet        1 tablet Oral Every M-W-F 01/23/21 0718        PRN meds: acetaminophen **OR** acetaminophen, albuterol, bisacodyl, fentaNYL (SUBLIMAZE) injection **OR** fentaNYL (SUBLIMAZE) injection, fluticasone, loratadine, polyethylene glycol   Objective: Vitals:   01/24/21 0537 01/24/21 0815  BP: (!) 155/55   Pulse: (!) 57   Resp: 20   Temp: 97.8 F (36.6 C)   SpO2: 96% 98%    Intake/Output Summary (Last 24 hours) at 01/24/2021 1134 Last data filed at 01/24/2021 0933 Gross per 24 hour  Intake 1355.83 ml  Output 300 ml  Net 1055.83 ml   Filed Weights   01/23/21 0120  Weight: 49.2 kg   Weight change:  Body mass index is 20.49 kg/m.   Physical Exam: General exam: Pleasant elderly Caucasian female.  Thin  built.  Not in distress at rest Skin: No rashes, lesions or ulcers. HEENT: Atraumatic, normocephalic, no obvious bleeding Lungs: Clear to auscultation bilaterally CVS: Regular rate and rhythm, no murmur GI/Abd soft, nontender, nondistended, bowel sound present CNS: Alert, awake, oriented to place Psychiatry: Cheerful demented Extremities: No pedal edema, no calf tenderness  Data Review: I have personally reviewed the laboratory data and studies available.  Recent Labs  Lab 01/23/21 0222 01/24/21 0532  WBC 7.5 5.2  NEUTROABS 4.5 2.7  HGB 10.5* 9.1*  HCT 32.0* 29.1*  MCV 96.1 101.0*  PLT 347 256   Recent Labs  Lab 01/23/21 0222 01/24/21 0532  NA 136 136  K 4.4 4.3  CL 106 106  CO2  19* 23  GLUCOSE 115* 125*  BUN 37* 36*  CREATININE 1.92* 1.65*  CALCIUM 9.8 9.2  MG  --  1.8    F/u labs ordered Unresulted Labs (From admission, onward)         None      Signed, Terrilee Croak, MD [facility] 01/24/2021

## 2021-01-25 LAB — BASIC METABOLIC PANEL
Anion gap: 8 (ref 5–15)
BUN: 49 mg/dL — ABNORMAL HIGH (ref 8–23)
CO2: 22 mmol/L (ref 22–32)
Calcium: 9.1 mg/dL (ref 8.9–10.3)
Chloride: 105 mmol/L (ref 98–111)
Creatinine, Ser: 1.63 mg/dL — ABNORMAL HIGH (ref 0.44–1.00)
GFR, Estimated: 31 mL/min — ABNORMAL LOW (ref >60)
Glucose, Bld: 173 mg/dL — ABNORMAL HIGH (ref 70–99)
Potassium: 4.2 mmol/L (ref 3.5–5.1)
Sodium: 135 mmol/L (ref 135–145)

## 2021-01-25 LAB — GLUCOSE, CAPILLARY
Glucose-Capillary: 106 mg/dL — ABNORMAL HIGH (ref 70–99)
Glucose-Capillary: 150 mg/dL — ABNORMAL HIGH (ref 70–99)
Glucose-Capillary: 172 mg/dL — ABNORMAL HIGH (ref 70–99)
Glucose-Capillary: 135 mg/dL — ABNORMAL HIGH (ref 70–99)

## 2021-01-25 NOTE — Progress Notes (Signed)
PROGRESS NOTE  Patricia [name]  DOB: 1932/02/09  PCP: [name], MD [id number]  DOA: 01/23/2021  LOS: 1 day   Chief Complaint  Patient presents with  . Hip Pain  . Neck Pain   Brief narrative: Patricia [name] is a 87 y.o. female with PMH significant for renal transplant (2009),  CKD 4, dementia, DM2, HTN, HLD, CAD, diastolic CHF, hypothyroidism, prior DVT on chronic Eliquis, GERD, recent Covid infection in early March. Patient presented to the ED on 4/11 with complaint of worsening right leg and hip pain after a fall at home when she tripped on her walker a week ago on 4/4. 4/4, patient presented to the ED.  Underwent CT scan which raised the concern of preprostatic fracture of the right hip.  Per the ED note at that time, patient was comfortable and was able to continue walking with a walker.  She was discharged from the ED with a recommendation to follow-up with orthopedics as an outpatient. Due to persistent pain and inability to ambulate, patient was brought back to ED again on 4/11. ED physician discussed the case with orthopedic Dr. Rolena Infante who suggested inpatient admission and management Patient was admitted to hospitalist service. See below for details.  Subjective: Patient was seen and examined this morning.  Pleasant elderly Caucasian female.  Propped up in bed.  Not in distress at rest.  Assessment/Plan: Periprosthetic fracture around internal prosthetic right hip joint -Seen by orthopedics Dr. Lyla Glassing this afternoon.  Recommended nonoperative treatment with touchdown weightbearing with a walker.  No active abduction for 6 weeks.  Follow-up repeat x-ray in 4 weeks.  -Continue pain control.  -PT recommended SNF.  History of renal transplant 2009 CKD4 of transplanted kidney -Creatinine currently at baseline.  Continue to monitor. -Continue immunosuppression with azathioprine 50 mg twice daily, cyclosporine 100 mg twice daily, Bactrim 3 times a week. Recent Labs     04/13/20 0903 04/19/20 0000 04/26/20 0000 12/19/20 1140 12/20/20 0721 12/21/20 0110 12/22/20 0225 01/23/21 0222 01/24/21 0532 01/25/21 0925  BUN 32* 28* 22* 51* 46* 50* 54* 37* 36* 49*  CREATININE 2.15* 1.4* 1.4* 2.16* 1.87* 1.62* 1.92* 1.92* 1.65* 1.63*   Type 2 diabetes mellitus -A1c 5.3 on 4/11. -Continue sliding scale insulin with Accu-Cheks. Recent Labs  Lab 01/24/21 1159 01/24/21 1728 01/24/21 2212 01/25/21 0742 01/25/21 1202  GLUCAP 145* 153* 161* 106* 150*   Cardiovascular issues: HTN, HLD, CAD, diastolic CHF -Home meds include metoprolol 25 mg twice daily and amlodipine 5 mg daily.  Not on diuretics -Continue both.  Continue to monitor blood pressure. -Continue statin  History of DVT chronically on Eliquis -Eliquis resumed. Dementia -Supportive care.  COPD Recent Covid infection -Stable respiratory status.  Continue bronchodilators.  Hypothyroidism -Continue Synthroid.  GERD -Continue PPI and Pepcid.  ?Urinary incontinence Oxybutynin 10 mg daily, Myrbetriq 50 mg daily,  Depression Prozac 20 mg daily, Remeron 7.5 mg at bedtime,  Mobility: PT eval pending Code Status:   Code Status: DNR  Nutritional status: Body mass index is 20.49 kg/m. Nutrition Problem: Severe Malnutrition Etiology: chronic illness Signs/Symptoms: severe fat depletion,severe muscle depletion,energy intake < or equal to 75% for > or equal to 1 month Diet Order            DIET DYS 2 Room service appropriate? Yes; Fluid consistency: Thin  Diet effective now                 DVT prophylaxis: apixaban (ELIQUIS) tablet 2.5 mg Start: 01/23/21 1700  SCDs Start: 01/23/21 0719   Antimicrobials:  None Fluid: Not on IV fluid Consultants: Orthopedics Family Communication:  None at bedside  Status is: Observation  Dispo: The patient is from: Home              Anticipated d/c is to: SNF when authorized and received.              Patient currently is medically stable to  d/c.   Difficult to place patient No       Infusions:    Scheduled Meds: . acetaminophen  650 mg Oral BID  . amLODipine  5 mg Oral Daily  . apixaban  2.5 mg Oral BID  . azaTHIOprine  50 mg Oral BID  . cycloSPORINE modified  100 mg Oral BID  . docusate sodium  100 mg Oral BID  . famotidine  20 mg Oral Q supper  . feeding supplement  237 mL Oral TID BM  . ferrous sulfate  325 mg Oral Q breakfast  . FLUoxetine  20 mg Oral Daily  . fluticasone furoate-vilanterol  1 puff Inhalation Daily  . insulin aspart  0-15 Units Subcutaneous TID AC & HS  . levothyroxine  75 mcg Oral Q0600  . metoCLOPramide  5 mg Oral TID AC & HS  . metoprolol tartrate  25 mg Oral BID  . mirabegron ER  50 mg Oral QPM  . multivitamin with minerals  1 tablet Oral Daily  . oxybutynin  10 mg Oral QHS  . pantoprazole  40 mg Oral Daily  . pravastatin  40 mg Oral Daily  . predniSONE  5 mg Oral Q breakfast  . sulfamethoxazole-trimethoprim  1 tablet Oral Q M,W,F    Antimicrobials: Anti-infectives (From admission, onward)   Start     Dose/Rate Route Frequency Ordered Stop   01/23/21 1000  sulfamethoxazole-trimethoprim (BACTRIM) 400-80 MG per tablet 1 tablet        1 tablet Oral Every M-W-F 01/23/21 0718        PRN meds: acetaminophen **OR** acetaminophen, albuterol, bisacodyl, fentaNYL (SUBLIMAZE) injection **OR** fentaNYL (SUBLIMAZE) injection, fluticasone, loratadine, polyethylene glycol   Objective: Vitals:   01/25/21 0847 01/25/21 1304  BP:  (!) 145/63  Pulse:  62  Resp:  18  Temp:  97.7 F (36.5 C)  SpO2: 98% 100%    Intake/Output Summary (Last 24 hours) at 01/25/2021 1620 Last data filed at 01/25/2021 1238 Gross per 24 hour  Intake 300 ml  Output --  Net 300 ml   Filed Weights   01/23/21 0120  Weight: 49.2 kg   Weight change:  Body mass index is 20.49 kg/m.   Physical Exam: General exam: Pleasant elderly Caucasian female.  Thin built.  Not in distress at rest Skin: No rashes,  lesions or ulcers. HEENT: Atraumatic, normocephalic, no obvious bleeding Lungs: Clear to auscultation bilaterally CVS: Regular rate and rhythm, no murmur GI/Abd soft, nontender, nondistended, bowel sound present CNS: Alert, awake, oriented to place Psychiatry: Cheerful demented Extremities: No pedal edema, no calf tenderness  Data Review: I have personally reviewed the laboratory data and studies available.  Recent Labs  Lab 01/23/21 0222 01/24/21 0532  WBC 7.5 5.2  NEUTROABS 4.5 2.7  HGB 10.5* 9.1*  HCT 32.0* 29.1*  MCV 96.1 101.0*  PLT 347 256   Recent Labs  Lab 01/23/21 0222 01/24/21 0532 01/25/21 0925  NA 136 136 135  K 4.4 4.3 4.2  CL 106 106 105  CO2 19* 23 22  GLUCOSE 115*  125* 173*  BUN 37* 36* 49*  CREATININE 1.92* 1.65* 1.63*  CALCIUM 9.8 9.2 9.1  MG  --  1.8  --     F/u labs ordered Unresulted Labs (From admission, onward)          Start     Ordered   01/26/21 0500  CBC with Differential/Platelet  Daily,   R      01/25/21 0903   01/26/21 [id number]  Basic metabolic panel  Daily,   R      01/25/21 0903          Signed, Terrilee Croak, MD [facility] 01/25/2021

## 2021-01-25 NOTE — [place] FL2 (Signed)
[place] MEDICAID FL2 LEVEL OF CARE SCREENING TOOL     IDENTIFICATION  Patient Name: Patricia [name] Birthdate: 05/20/1932 Sex: female Admission Date (Current Location): 01/23/2021  [facility] and [place] Number:  Herbalist and Address:  [facility],  [place] [address] [place]      Provider Number: [id number]  Attending Physician Name and Address:  Terrilee Croak, MD  Relative Name and Phone Number:       Current Level of Care: Hospital Recommended Level of Care: [place] Prior Approval Number:    Date Approved/Denied:   PASRR Number: [id number]  Discharge Plan: SNF    Current Diagnoses: Patient Active Problem List   Diagnosis Date Noted  . Periprosthetic fracture around internal prosthetic right hip joint, initial encounter ([place]) 01/23/2021  . GERD without esophagitis 01/23/2021  . History of DVT (deep vein thrombosis) 01/23/2021  . COVID-19 virus infection 12/27/2020  . Severe protein-calorie malnutrition ([place]) 12/21/2020  . Dehydration   . Failure to thrive in adult   . Palliative care by specialist   . Goals of care, counseling/discussion   . Pain in both upper extremities 04/11/2020  . History of anemia due to CKD 12/31/2019  . Anemia in chronic kidney disease (CKD) 12/31/2019  . Generalized weakness 12/30/2019  . Cholecystitis with cholelithiasis 07/01/2018  . Postoperative anemia due to acute blood loss 02/15/2016  . Mixed diabetic hyperlipidemia associated with type 2 diabetes mellitus ([place]) 02/15/2016  . Displaced fracture of right femoral neck ([place]) 02/10/2016  . Chronic diastolic CHF (congestive heart failure) ([place]) 02/08/2016  . Closed right hip fracture ([place]) 02/08/2016  . Hip fracture ([place]) 02/08/2016  . Urinary incontinence 05/27/2015  . Escherichia coli urinary tract infection   . Other emphysema ([place])   . Hypokalemia   . Hypomagnesemia   . Sepsis secondary to UTI ([place]) 05/22/2015  . DVT, lower  extremity, proximal ([place]) 03/02/2014  . Chronic kidney disease, stage 4, severely decreased GFR (HCC) 03/02/2014  . Essential hypertension 03/02/2014  . DVT (deep venous thrombosis) ([place]) 03/01/2014  . Acute renal failure superimposed on stage 3 chronic kidney disease ([place]) 03/01/2014  . Hypercalcemia 11/22/2013  . Urinary tract infection 10/05/2013  . History of renal transplant   . Multiple allergies   . Abnormal gait   . Reflux   . Diverticulitis   . Hypothyroidism   . Depression   . Anxiety   . COPD (chronic obstructive pulmonary disease) ([place])   . Dizziness   . Controlled type 2 diabetes mellitus with stage 4 chronic kidney disease, with long-term current use of insulin (HCC)     Orientation RESPIRATION BLADDER Height & Weight     Self,Place,Situation  Normal Continent Weight: 49.2 kg Height:  '5\' 1"'$  (154.9 cm)  BEHAVIORAL SYMPTOMS/MOOD NEUROLOGICAL BOWEL NUTRITION STATUS      Incontinent Diet (Dysphagia 2)  AMBULATORY STATUS COMMUNICATION OF NEEDS Skin   Extensive Assist Verbally Skin abrasions,Bruising                       Personal Care Assistance Level of Assistance  Bathing,Dressing Bathing Assistance: Limited assistance   Dressing Assistance: Limited assistance     Functional Limitations Info  Hearing,Sight,Speech Sight Info: Adequate Hearing Info: Impaired Speech Info: Adequate    SPECIAL CARE FACTORS FREQUENCY  PT (By licensed PT),OT (By licensed OT) (TDWB RLE with walker. No active abduction for 6 weeks)     PT Frequency: 5 x weekly OT  Frequency: 5 x weekly            Contractures Contractures Info: Not present    Additional Factors Info  Code Status,Allergies Code Status Info: DNR Allergies Info: Macrolides And Ketolides, Aricept (Donepezil Hcl), Codeine, Morphine, Penicillin G, Penicillins, Statins, Streptomycin, Tetracycline, Tetracyclines & Related           Current Medications (01/25/2021):  This is the current hospital active  medication list Current Facility-Administered Medications  Medication Dose Route Frequency Provider Last Rate Last Admin  . acetaminophen (TYLENOL) tablet 650 mg  650 mg Oral Q6H PRN Vernelle Emerald, MD   650 mg at 01/25/21 [id number]   Or  . acetaminophen (TYLENOL) suppository 650 mg  650 mg Rectal Q6H PRN Vernelle Emerald, MD      . acetaminophen (TYLENOL) tablet 650 mg  650 mg Oral BID Vernelle Emerald, MD   650 mg at 01/25/21 0943  . albuterol (PROVENTIL) (2.5 MG/3ML) 0.083% nebulizer solution 3 mL  3 mL Inhalation Q6H PRN Shalhoub, Sherryll Burger, MD      . amLODipine (NORVASC) tablet 5 mg  5 mg Oral Daily Dahal, Binaya, MD   5 mg at 01/25/21 0944  . apixaban (ELIQUIS) tablet 2.5 mg  2.5 mg Oral BID Terrilee Croak, MD   2.5 mg at 01/25/21 0944  . azaTHIOprine (IMURAN) tablet 50 mg  50 mg Oral BID Vernelle Emerald, MD   50 mg at 01/25/21 0943  . bisacodyl (DULCOLAX) EC tablet 5 mg  5 mg Oral Daily PRN Shalhoub, Sherryll Burger, MD      . cycloSPORINE modified (NEORAL) capsule 100 mg  100 mg Oral BID Vernelle Emerald, MD   100 mg at 01/25/21 0943  . docusate sodium (COLACE) capsule 100 mg  100 mg Oral BID Vernelle Emerald, MD   100 mg at 01/25/21 0944  . famotidine (PEPCID) tablet 20 mg  20 mg Oral Q supper Shalhoub, Sherryll Burger, MD   20 mg at 01/24/21 1735  . feeding supplement (ENSURE ENLIVE / ENSURE PLUS) liquid 237 mL  237 mL Oral TID BM Dahal, Marlowe Aschoff, MD   237 mL at 01/25/21 0945  . fentaNYL (SUBLIMAZE) injection 12.5 mcg  12.5 mcg Intravenous Q2H PRN Shalhoub, Sherryll Burger, MD       Or  . fentaNYL (SUBLIMAZE) injection 25 mcg  25 mcg Intravenous Q2H PRN Vernelle Emerald, MD   25 mcg at 01/25/21 0745  . ferrous sulfate tablet 325 mg  325 mg Oral Q breakfast Dahal, Marlowe Aschoff, MD   325 mg at 01/25/21 0745  . FLUoxetine (PROZAC) capsule 20 mg  20 mg Oral Daily Shalhoub, Sherryll Burger, MD   20 mg at 01/25/21 0944  . fluticasone (FLONASE) 50 MCG/ACT nasal spray 1 spray  1 spray Each Nare Daily PRN Shalhoub,  Sherryll Burger, MD      . fluticasone furoate-vilanterol (BREO ELLIPTA) 200-25 MCG/INH 1 puff  1 puff Inhalation Daily Dahal, Binaya, MD   1 puff at 01/25/21 0847  . insulin aspart (novoLOG) injection 0-15 Units  0-15 Units Subcutaneous TID AC & HS Shalhoub, Sherryll Burger, MD   3 Units at 01/24/21 2248  . levothyroxine (SYNTHROID) tablet 75 mcg  75 mcg Oral Q0600 Vernelle Emerald, MD   75 mcg at 01/25/21 0547  . loratadine (CLARITIN) tablet 10 mg  10 mg Oral Daily PRN Shalhoub, Sherryll Burger, MD      . metoCLOPramide (REGLAN) tablet 5 mg  5 mg Oral  TID AC & HS Dahal, Marlowe Aschoff, MD   5 mg at 01/25/21 0745  . metoprolol tartrate (LOPRESSOR) tablet 25 mg  25 mg Oral BID Vernelle Emerald, MD   25 mg at 01/25/21 0943  . mirabegron ER (MYRBETRIQ) tablet 50 mg  50 mg Oral QPM Shalhoub, Sherryll Burger, MD   50 mg at 01/24/21 1735  . multivitamin with minerals tablet 1 tablet  1 tablet Oral Daily Dahal, Marlowe Aschoff, MD   1 tablet at 01/25/21 0943  . oxybutynin (DITROPAN-XL) 24 hr tablet 10 mg  10 mg Oral QHS Terrilee Croak, MD   10 mg at 01/24/21 2121  . pantoprazole (PROTONIX) EC tablet 40 mg  40 mg Oral Daily Shalhoub, Sherryll Burger, MD   40 mg at 01/25/21 0943  . polyethylene glycol (MIRALAX / GLYCOLAX) packet 17 g  17 g Oral Daily PRN Shalhoub, Sherryll Burger, MD      . pravastatin (PRAVACHOL) tablet 40 mg  40 mg Oral Daily Shalhoub, Sherryll Burger, MD   40 mg at 01/25/21 0944  . predniSONE (DELTASONE) tablet 5 mg  5 mg Oral Q breakfast Shalhoub, Sherryll Burger, MD   5 mg at 01/25/21 0745  . sulfamethoxazole-trimethoprim (BACTRIM) 400-80 MG per tablet 1 tablet  1 tablet Oral Q M,W,F Shalhoub, Sherryll Burger, MD   1 tablet at 01/25/21 0944     Discharge Medications: Please see discharge summary for a list of discharge medications.  Relevant Imaging Results:  Relevant Lab Results:   Additional Information SS#[id number]. Fully vaccinated and boosted  Yadir Zentner, Marjie Skiff, RN

## 2021-01-25 NOTE — TOC Initial Note (Signed)
Transition of Care [facility]) - Initial/Assessment Note    Patient Details  Name: Patricia [name] MRN: [id number] Date of Birth: 1931/12/31  [facility]) CM/SW Contact:    Lynnell Catalan, RN Phone Number: 01/25/2021, 10:46 AM  Clinical Narrative:                 Spoke with pt at bedside for dc planning. She defers planning to husband Patricia [name]. Howard contacted via phone and PT recommendations gone over with him. He agrees to SNF placement and states that she has been to [facility] before. FL2 completed and faxed out to area facilities. Auth for SNF [facility] with [facility] ref # [id number]. TOC will follow up with SNF bed offers when available.  Expected Discharge Plan: Skilled Nursing Facility Barriers to Discharge: Continued Medical Work up   Patient Goals and CMS Choice        Expected Discharge Plan and Services Expected Discharge Plan: [place]   Discharge Planning Services: CM Consult            Prior Living Arrangements/Services   Lives with:: [place] Patient language and need for interpreter reviewed:: Yes        Need for Family Participation in Patient Care: Yes (Comment) Care giver support system in place?: Yes (comment)   Criminal Activity/Legal Involvement Pertinent to Current Situation/Hospitalization: No - Comment as needed  Activities of Daily Living Home Assistive Devices/Equipment: Wheelchair,Eyeglasses,CBG Meter,Other (Comment) (walk-in shower) ADL Screening (condition at time of admission) Patient's cognitive ability adequate to safely complete daily activities?: No Is the patient deaf or have difficulty hearing?: Yes ([place]) Does the patient have difficulty seeing, even when wearing glasses/contacts?: No Does the patient have difficulty concentrating, remembering, or making decisions?: Yes Patient able to express need for assistance with ADLs?: No Does the patient have difficulty dressing or bathing?:  Yes Independently performs ADLs?: No Communication: Needs assistance (limited verbalization) Is this a change from baseline?: Pre-admission baseline Dressing (OT): Needs assistance Is this a change from baseline?: Pre-admission baseline Grooming: Needs assistance Is this a change from baseline?: Pre-admission baseline Feeding: Needs assistance Is this a change from baseline?: Pre-admission baseline Bathing: Needs assistance Is this a change from baseline?: Pre-admission baseline Toileting: Dependent Is this a change from baseline?: Pre-admission baseline In/Out Bed: Dependent Is this a change from baseline?: Pre-admission baseline Walks in Home: Dependent Is this a change from baseline?: Pre-admission baseline Does the patient have difficulty walking or climbing stairs?: Yes (secondary to weakness) Weakness of Legs: Both Weakness of Arms/Hands: None  Permission Sought/Granted                  Emotional Assessment Appearance:: Appears stated age Attitude/Demeanor/Rapport: Engaged Affect (typically observed): Calm Orientation: : Oriented to Self,Oriented to Place,Oriented to Situation Alcohol / Substance Use: Not Applicable    Admission diagnosis:  Periprosthetic fracture around internal prosthetic right hip joint, initial encounter ([place]) [id number]] Closed comminuted intertrochanteric fracture of proximal end of right femur ([place]) [S72.141A] Periprosthetic fracture of proximal end of femur [M97.8XXA, [place] Patient Active Problem List   Diagnosis Date Noted  . Periprosthetic fracture around internal prosthetic right hip joint, initial encounter ([place]) 01/23/2021  . GERD without esophagitis 01/23/2021  . History of DVT (deep vein thrombosis) 01/23/2021  . COVID-19 virus infection 12/27/2020  . Severe protein-calorie malnutrition ([place]) 12/21/2020  . Dehydration   . Failure to thrive in adult   . Palliative care by specialist   . Goals of care,  counseling/discussion   .  Pain in both upper extremities 04/11/2020  . History of anemia due to CKD 12/31/2019  . Anemia in chronic kidney disease (CKD) 12/31/2019  . Generalized weakness 12/30/2019  . Cholecystitis with cholelithiasis 07/01/2018  . Postoperative anemia due to acute blood loss 02/15/2016  . Mixed diabetic hyperlipidemia associated with type 2 diabetes mellitus ([place]) 02/15/2016  . Displaced fracture of right femoral neck ([place]) 02/10/2016  . Chronic diastolic CHF (congestive heart failure) ([place]) 02/08/2016  . Closed right hip fracture ([place]) 02/08/2016  . Hip fracture ([place]) 02/08/2016  . Urinary incontinence 05/27/2015  . Escherichia coli urinary tract infection   . Other emphysema ([place])   . Hypokalemia   . Hypomagnesemia   . Sepsis secondary to UTI ([place]) 05/22/2015  . DVT, lower extremity, proximal ([place]) 03/02/2014  . Chronic kidney disease, stage 4, severely decreased GFR (HCC) 03/02/2014  . Essential hypertension 03/02/2014  . DVT (deep venous thrombosis) ([place]) 03/01/2014  . Acute renal failure superimposed on stage 3 chronic kidney disease ([place]) 03/01/2014  . Hypercalcemia 11/22/2013  . Urinary tract infection 10/05/2013  . History of renal transplant   . Multiple allergies   . Abnormal gait   . Reflux   . Diverticulitis   . Hypothyroidism   . Depression   . Anxiety   . COPD (chronic obstructive pulmonary disease) ([place])   . Dizziness   . Controlled type 2 diabetes mellitus with stage 4 chronic kidney disease, with long-term current use of insulin ([place])    PCP:  [name], MD Pharmacy:   [facility] - [place], [place] - [address] [place] [address] Phone: [phone] Fax: [place] #[id number] [address], [place] AT [facility] [place] [id number] Phone: [phone] Fax: [phone]     Social Determinants of Health ([place])  Interventions    Readmission Risk Interventions Readmission Risk Prevention Plan 01/25/2021 12/22/2020  Transportation Screening Complete Complete  Medication Review Press photographer) Complete Referral to Pharmacy  PCP or Specialist appointment within 3-5 days of discharge Complete Complete  HRI or Home Care Consult Complete Complete  SW Recovery Care/Counseling Consult Complete Complete  Palliative Care Screening Not Applicable Complete  Skilled Nursing Facility Complete Complete  Some recent data might be hidden

## 2021-01-26 DIAGNOSIS — I69828 Other speech and language deficits following other cerebrovascular disease: Secondary | ICD-10-CM | POA: Diagnosis not present

## 2021-01-26 DIAGNOSIS — Z96649 Presence of unspecified artificial hip joint: Secondary | ICD-10-CM | POA: Diagnosis not present

## 2021-01-26 DIAGNOSIS — E1159 Type 2 diabetes mellitus with other circulatory complications: Secondary | ICD-10-CM | POA: Diagnosis not present

## 2021-01-26 DIAGNOSIS — F324 Major depressive disorder, single episode, in partial remission: Secondary | ICD-10-CM | POA: Diagnosis not present

## 2021-01-26 DIAGNOSIS — M6281 Muscle weakness (generalized): Secondary | ICD-10-CM | POA: Diagnosis not present

## 2021-01-26 DIAGNOSIS — I5030 Unspecified diastolic (congestive) heart failure: Secondary | ICD-10-CM | POA: Diagnosis not present

## 2021-01-26 DIAGNOSIS — Z7401 Bed confinement status: Secondary | ICD-10-CM | POA: Diagnosis not present

## 2021-01-26 DIAGNOSIS — R2681 Unsteadiness on feet: Secondary | ICD-10-CM | POA: Diagnosis not present

## 2021-01-26 DIAGNOSIS — M978XXA Periprosthetic fracture around other internal prosthetic joint, initial encounter: Secondary | ICD-10-CM | POA: Diagnosis not present

## 2021-01-26 DIAGNOSIS — R262 Difficulty in walking, not elsewhere classified: Secondary | ICD-10-CM | POA: Diagnosis not present

## 2021-01-26 DIAGNOSIS — M2550 Pain in unspecified joint: Secondary | ICD-10-CM | POA: Diagnosis not present

## 2021-01-26 DIAGNOSIS — E7800 Pure hypercholesterolemia, unspecified: Secondary | ICD-10-CM | POA: Diagnosis not present

## 2021-01-26 DIAGNOSIS — M810 Age-related osteoporosis without current pathological fracture: Secondary | ICD-10-CM | POA: Diagnosis not present

## 2021-01-26 DIAGNOSIS — E039 Hypothyroidism, unspecified: Secondary | ICD-10-CM | POA: Diagnosis not present

## 2021-01-26 DIAGNOSIS — M9701XD Periprosthetic fracture around internal prosthetic right hip joint, subsequent encounter: Secondary | ICD-10-CM | POA: Diagnosis not present

## 2021-01-26 DIAGNOSIS — J449 Chronic obstructive pulmonary disease, unspecified: Secondary | ICD-10-CM | POA: Diagnosis not present

## 2021-01-26 DIAGNOSIS — H35033 Hypertensive retinopathy, bilateral: Secondary | ICD-10-CM | POA: Diagnosis not present

## 2021-01-26 DIAGNOSIS — N184 Chronic kidney disease, stage 4 (severe): Secondary | ICD-10-CM | POA: Diagnosis not present

## 2021-01-26 DIAGNOSIS — R1312 Dysphagia, oropharyngeal phase: Secondary | ICD-10-CM | POA: Diagnosis not present

## 2021-01-26 DIAGNOSIS — I129 Hypertensive chronic kidney disease with stage 1 through stage 4 chronic kidney disease, or unspecified chronic kidney disease: Secondary | ICD-10-CM | POA: Diagnosis not present

## 2021-01-26 DIAGNOSIS — Z9181 History of falling: Secondary | ICD-10-CM | POA: Diagnosis not present

## 2021-01-26 DIAGNOSIS — M9701XA Periprosthetic fracture around internal prosthetic right hip joint, initial encounter: Secondary | ICD-10-CM | POA: Diagnosis not present

## 2021-01-26 DIAGNOSIS — R410 Disorientation, unspecified: Secondary | ICD-10-CM | POA: Diagnosis not present

## 2021-01-26 LAB — CBC WITH DIFFERENTIAL/PLATELET
Abs Immature Granulocytes: 0.04 10*3/uL (ref 0.00–0.07)
Basophils Absolute: 0 10*3/uL (ref 0.0–0.1)
Basophils Relative: 0 %
Eosinophils Absolute: 0.1 10*3/uL (ref 0.0–0.5)
Eosinophils Relative: 2 %
HCT: 31.9 % — ABNORMAL LOW (ref 36.0–46.0)
Hemoglobin: 10.1 g/dL — ABNORMAL LOW (ref 12.0–15.0)
Immature Granulocytes: 0 %
Lymphocytes Relative: 27 %
Lymphs Abs: 2.4 10*3/uL (ref 0.7–4.0)
MCH: 31.3 pg (ref 26.0–34.0)
MCHC: 31.7 g/dL (ref 30.0–36.0)
MCV: 98.8 fL (ref 80.0–100.0)
Monocytes Absolute: 0.8 10*3/uL (ref 0.1–1.0)
Monocytes Relative: 9 %
Neutro Abs: 5.5 10*3/uL (ref 1.7–7.7)
Neutrophils Relative %: 62 %
Platelets: 369 10*3/uL (ref 150–400)
RBC: 3.23 MIL/uL — ABNORMAL LOW (ref 3.87–5.11)
RDW: 14.4 % (ref 11.5–15.5)
WBC: 8.9 10*3/uL (ref 4.0–10.5)
nRBC: 0 % (ref 0.0–0.2)

## 2021-01-26 LAB — BASIC METABOLIC PANEL
Anion gap: 10 (ref 5–15)
BUN: 56 mg/dL — ABNORMAL HIGH (ref 8–23)
CO2: 21 mmol/L — ABNORMAL LOW (ref 22–32)
Calcium: 9.8 mg/dL (ref 8.9–10.3)
Chloride: 107 mmol/L (ref 98–111)
Creatinine, Ser: 1.55 mg/dL — ABNORMAL HIGH (ref 0.44–1.00)
GFR, Estimated: 32 mL/min — ABNORMAL LOW (ref >60)
Glucose, Bld: 107 mg/dL — ABNORMAL HIGH (ref 70–99)
Potassium: 4.3 mmol/L (ref 3.5–5.1)
Sodium: 138 mmol/L (ref 135–145)

## 2021-01-26 LAB — GLUCOSE, CAPILLARY
Glucose-Capillary: 117 mg/dL — ABNORMAL HIGH (ref 70–99)
Glucose-Capillary: 111 mg/dL — ABNORMAL HIGH (ref 70–99)

## 2021-01-26 MED ORDER — OXYCODONE HCL 5 MG PO TABS: 5.0000 mg | ORAL_TABLET | Freq: Three times a day (TID) | ORAL | 0 refills | Status: AC | PRN

## 2021-01-26 MED ORDER — POLYETHYLENE GLYCOL 3350 17 G PO PACK: 17.0000 g | PACK | Freq: Every day | ORAL | 0 refills | Status: DC | PRN

## 2021-01-26 MED ORDER — ACETAMINOPHEN 325 MG PO TABS: 650.0000 mg | ORAL_TABLET | Freq: Two times a day (BID) | ORAL | Status: DC

## 2021-01-26 NOTE — Progress Notes (Signed)
Report called to [employer].

## 2021-01-26 NOTE — Discharge Summary (Signed)
Physician Discharge Summary  Patricia [name] [id number] DOB: 07-01-1932 DOA: 01/23/2021  PCP: [name], MD  Admit date: 01/23/2021 Discharge date: 01/26/2021  Admitted From: Home Discharge disposition: SNF   Code Status: DNR  Diet Recommendation: Cardiac/diabetic diet  Discharge Diagnosis:   Principal Problem:   Periprosthetic fracture around internal prosthetic right hip joint, initial encounter ([place]) Active Problems:   History of renal transplant   Hypothyroidism   COPD (chronic obstructive pulmonary disease) ([place])   Controlled type 2 diabetes mellitus with stage 4 chronic kidney disease, with long-term current use of insulin (HCC)   Chronic kidney disease, stage 4, severely decreased GFR (HCC)   Essential hypertension   Chronic diastolic CHF (congestive heart failure) ([place])   Mixed diabetic hyperlipidemia associated with type 2 diabetes mellitus ([place])   Severe protein-calorie malnutrition (HCC)   GERD without esophagitis   History of DVT (deep vein thrombosis)  Chief Complaint  Patient presents with  . Hip Pain  . Neck Pain   Brief narrative: Patricia [name] is a 87 y.o. female with PMH significant for renal transplant (2009),  CKD 4, dementia, DM2, HTN, HLD, CAD, diastolic CHF, hypothyroidism, prior DVT on chronic Eliquis, GERD, recent Covid infection in early March. Patient presented to the ED on 4/11 with complaint of worsening right leg and hip pain after a fall at home when she tripped on her walker a week ago on 4/4. 4/4, patient presented to the ED.  Underwent CT scan which raised the concern of preprostatic fracture of the right hip.  Per the ED note at that time, patient was comfortable and was able to continue walking with a walker.  She was discharged from the ED with a recommendation to follow-up with orthopedics as an outpatient. Due to persistent pain and inability to ambulate, patient was brought back to ED again on 4/11. ED physician discussed the  case with orthopedic Dr. Rolena Infante who suggested inpatient admission and management Patient was admitted to hospitalist service. See below for details.  Subjective: Patient was seen and examined this morning.  Pleasant elderly Caucasian female.  Propped up in bed.  Not in distress at rest.  Hospital course: Periprosthetic fracture around internal prosthetic right hip joint -Seen by orthopedics Dr. Lyla Glassing this hospitalization.  Recommended nonoperative treatment with touchdown weightbearing with a walker.  No active abduction for 6 weeks.  Follow-up repeat x-ray in 4 weeks.  -Continue pain control.  -PT recommended SNF.  History of renal transplant 2009 CKD4 of transplanted kidney -Creatinine currently at baseline.   -Continue immunosuppression with azathioprine 50 mg twice daily, cyclosporine 100 mg twice daily, Bactrim 3 times a week. Recent Labs    04/19/20 0000 04/26/20 0000 12/19/20 1140 12/20/20 0721 12/21/20 0110 12/22/20 0225 01/23/21 0222 01/24/21 0532 01/25/21 0925 01/26/21 0540  BUN 28* 22* 51* 46* 50* 54* 37* 36* 49* 56*  CREATININE 1.4* 1.4* 2.16* 1.87* 1.62* 1.92* 1.92* 1.65* 1.63* 1.55*   Type 2 diabetes mellitus -A1c 5.3 on 4/11. -Continue sliding scale insulin with Accu-Cheks. Recent Labs  Lab 01/25/21 1202 01/25/21 1721 01/25/21 2214 01/26/21 0749 01/26/21 1223  GLUCAP 150* 172* 135* 117* 111*   Cardiovascular issues: HTN, HLD, CAD, diastolic CHF -Home meds include metoprolol 25 mg twice daily and amlodipine 5 mg daily.  Not on diuretics -Continue both.  Continue to monitor blood pressure. -Continue statin  History of DVT chronically on Eliquis -Eliquis resumed. Dementia -Supportive care.  COPD Recent Covid infection -Stable respiratory status.  Continue bronchodilators.  Hypothyroidism -Continue Synthroid.  GERD -Continue PPI and Pepcid.  ?Urinary incontinence Oxybutynin 10 mg daily, Myrbetriq 50 mg daily,  Depression Prozac 20 mg  daily, Remeron 7.5 mg at bedtime.   Wound care: Wound / Incision (Open or Dehisced) 12/19/20 Skin tear Chest Right (Active)  Date First Assessed/Time First Assessed: 12/19/20 2130   Wound Type: Skin tear  Location: Chest  Location Orientation: Right  Present on Admission: Yes    Assessments 12/19/2020  9:30 PM 12/22/2020  8:45 AM  Dressing Type Foam - Lift dressing to assess site every shift Foam - Lift dressing to assess site every shift  Dressing Changed New --  Dressing Status Clean;Dry;Intact Clean;Dry;Intact     No Linked orders to display    Discharge Exam:   Vitals:   01/25/21 1304 01/25/21 2028 01/26/21 0500 01/26/21 0735  BP: (!) 145/63 135/60    Pulse: 62 63    Resp: 18 17    Temp: 97.7 F (36.5 C) 98.3 F (36.8 C)    TempSrc: Oral Oral    SpO2: 100% 99%  99%  Weight:   53 kg   Height:        Body mass index is 22.08 kg/m.  General exam: Pleasant elderly Caucasian female.  Thin built.  Not in distress at rest Skin: No rashes, lesions or ulcers. HEENT: Atraumatic, normocephalic, no obvious bleeding Lungs:  Clear to auscultation bilaterally CVS: Regular rate and rhythm, no murmur GI/Abd soft, nontender, nondistended, bowel sound present CNS: Alert, awake, oriented to place Psychiatry: Cheerful demented Extremities: No pedal edema, no calf tenderness.   Follow ups:   Discharge Instructions    Diet general   Complete by: As directed    Dysphagia 2 diet   Increase activity slowly   Complete by: As directed       Follow-up Information    [name], Christiane Ha, MD Follow up.   Specialty: Internal Medicine Contact information: [address] [place] [address] [phone]        Rod Can, MD Follow up in 4 week(s).   Specialty: Orthopedic Surgery Contact information: [address] [place] [address] [phone]               Recommendations for Outpatient Follow-Up:   1. Follow-up with PCP as an  outpatient 2. Follow-up orthopedics as an outpatient in 4 weeks  Discharge Instructions:  Follow with Primary MD [name], MD in 7 days   Get CBC/BMP checked in next visit within 1 week by PCP or SNF MD ( we routinely change or add medications that can affect your baseline labs and fluid status, therefore we recommend that you get the mentioned basic workup next visit with your PCP, your PCP may decide not to get them or add new tests based on their clinical decision)  On your next visit with your PCP, please Get Medicines reviewed and adjusted.  Please request your PCP  to go over all Hospital Tests and Procedure/Radiological results at the follow up, please get all Hospital records sent to your Prim MD by signing hospital release before you go home.  Activity: As tolerated with Full fall precautions use walker/cane & assistance as needed  For Heart failure patients - Check your Weight same time everyday, if you gain over 2 pounds, or you develop in leg swelling, experience more shortness of breath or chest pain, call your Primary MD immediately. Follow Cardiac Low Salt Diet and 1.5 lit/day fluid restriction.  If you have smoked or chewed Tobacco in the last 2 yrs please stop smoking, stop any regular Alcohol  and or any Recreational drug use.  If you experience worsening of your admission symptoms, develop shortness of breath, life threatening emergency, suicidal or homicidal thoughts you must seek medical attention immediately by calling 911 or calling your MD immediately  if symptoms less severe.  You Must read complete instructions/literature along with all the possible adverse reactions/side effects for all the Medicines you take and that have been prescribed to you. Take any new Medicines after you have completely understood and accpet all the possible adverse reactions/side effects.   Do not drive, operate heavy machinery, perform activities at heights, swimming or participation  in water activities or provide baby sitting services if your were admitted for syncope or siezures until you have seen by Primary MD or a Neurologist and advised to do so again.  Do not drive when taking Pain medications.  Do not take more than prescribed Pain, Sleep and Anxiety Medications  Wear Seat belts while driving.   Please note You were cared for by a hospitalist during your hospital stay. If you have any questions about your discharge medications or the care you received while you were in the hospital after you are discharged, you can call the unit and asked to speak with the hospitalist on call if the hospitalist that took care of you is not available. Once you are discharged, your primary care physician will handle any further medical issues. Please note that NO REFILLS for any discharge medications will be authorized once you are discharged, as it is imperative that you return to your primary care physician (or establish a relationship with a primary care physician if you do not have one) for your aftercare needs so that they can reassess your need for medications and monitor your lab values.    Allergies as of 01/26/2021      Reactions   Macrolides And Ketolides Nausea Only   Aricept [donepezil Hcl] Nausea And Vomiting   Codeine Nausea And Vomiting   Morphine Nausea And Vomiting   Penicillin G Nausea Only   Penicillins Nausea And Vomiting, Other (See Comments)   HEADACHE Has patient had a PCN reaction causing immediate rash, facial/tongue/throat swelling, SOB or lightheadedness with hypotension: unkn Has patient had a PCN reaction causing severe rash involving mucus membranes or skin necrosis: unkn Has patient had a PCN reaction that required hospitalization: unkn Has patient had a PCN reaction occurring within the last 10 years: unkn If all of the above answers are "NO", then may proceed with Cephalosporin use.   Statins Other (See Comments)   Doesn't remember   Streptomycin  Other (See Comments)   headache   Tetracycline Nausea Only   Tetracyclines & Related Rash      Medication List    STOP taking these medications   HYDROcodone-acetaminophen 5-325 MG tablet Commonly known as: Norco   mirtazapine 15 MG tablet Commonly known as: REMERON     TAKE these medications   acetaminophen 325 MG tablet Commonly known as: TYLENOL Take 2 tablets (650 mg total) by mouth 2 (two) times daily. What changed:   when to take this  reasons to take this   albuterol 108 (90 Base) MCG/ACT inhaler Commonly known as: VENTOLIN HFA Inhale 2 puffs into the lungs every 6 (six) hours as needed for wheezing or shortness of breath.   amLODipine 5 MG tablet Commonly known as: NORVASC Take 1  tablet (5 mg total) by mouth daily.   apixaban 2.5 MG Tabs tablet Commonly known as: ELIQUIS Take 1 tablet (2.5 mg total) by mouth 2 (two) times daily.   azaTHIOprine 50 MG tablet Commonly known as: IMURAN Take 1 tablet (50 mg total) by mouth 2 (two) times daily.   bisacodyl 5 MG EC tablet Commonly known as: DULCOLAX Take 1 tablet (5 mg total) by mouth daily as needed for moderate constipation.   budesonide-formoterol 160-4.5 MCG/ACT inhaler Commonly known as: Symbicort Inhale 2 puffs into the lungs in the morning and at bedtime.   cycloSPORINE modified 100 MG capsule Commonly known as: NEORAL Take 1 capsule (100 mg total) by mouth 2 (two) times daily.   docusate sodium 100 MG capsule Commonly known as: COLACE Take 1 capsule (100 mg total) by mouth 2 (two) times daily.   famotidine 20 MG tablet Commonly known as: PEPCID Take 1 tablet (20 mg total) by mouth daily with supper.   ferrous sulfate 325 (65 FE) MG tablet Take 1 tablet (325 mg total) by mouth daily with breakfast.   FLUoxetine 20 MG tablet Commonly known as: PROZAC Take 1 tablet (20 mg total) by mouth daily.   fluticasone 50 MCG/ACT nasal spray Commonly known as: FLONASE Place 1 spray into both nostrils  daily as needed for allergies or rhinitis.   Glucerna Liqd Take 237 mLs by mouth 2 (two) times daily.   insulin lispro 100 UNIT/ML KwikPen Commonly known as: HUMALOG Inject 8 Units into the skin daily.   levothyroxine 75 MCG tablet Commonly known as: SYNTHROID Take 1 tablet (75 mcg total) by mouth daily.   loratadine 10 MG tablet Commonly known as: CLARITIN Take 1 tablet (10 mg total) by mouth daily as needed for allergies.   Lysine 500 MG Tabs Take 1 tablet (500 mg total) by mouth daily.   metoCLOPramide 5 MG tablet Commonly known as: REGLAN Take 1 tablet (5 mg total) by mouth 4 (four) times daily.   metoprolol tartrate 25 MG tablet Commonly known as: LOPRESSOR Take 1 tablet (25 mg total) by mouth 2 (two) times daily.   mirabegron ER 50 MG Tb24 tablet Commonly known as: Myrbetriq Take 1 tablet (50 mg total) by mouth every evening.   omeprazole 20 MG capsule Commonly known as: PRILOSEC Take 1 capsule (20 mg total) by mouth daily.   oxyCODONE 5 MG immediate release tablet Commonly known as: Roxicodone Take 1 tablet (5 mg total) by mouth every 8 (eight) hours as needed for up to 5 days for severe pain.   polyethylene glycol 17 g packet Commonly known as: MIRALAX / GLYCOLAX Take 17 g by mouth daily as needed for mild constipation.   pravastatin 40 MG tablet Commonly known as: PRAVACHOL Take 1 tablet (40 mg total) by mouth daily.   predniSONE 5 MG tablet Commonly known as: DELTASONE Take 1 tablet (5 mg total) by mouth daily with breakfast.   sulfamethoxazole-trimethoprim 400-80 MG tablet Commonly known as: BACTRIM Take 1 tablet by mouth every Monday, Wednesday, and Friday.   Vitamin D 50 MCG (2000 UT) tablet Take 1 tablet (2,000 Units total) by mouth every evening.       Time coordinating discharge: 35 minutes  The results of significant diagnostics from this hospitalization (including imaging, microbiology, ancillary and laboratory) are listed below for  reference.    Procedures and Diagnostic Studies:   DG HIP PORT UNILAT WITH PELVIS 1V RIGHT  Result Date: 01/23/2021 CLINICAL DATA:  Closed communicated intertrochanteric  fracture of the right proximal femur. EXAM: DG HIP (WITH OR WITHOUT PELVIS) 1V PORT RIGHT COMPARISON:  Femur radiographs 01/16/2021 FINDINGS: Periprosthetic right proximal femur fracture again seen. There is slightly greater displacement of the fracture fragments compared to the prior examination. Diffuse osteopenia. IMPRESSION: Periprosthetic right proximal femur fracture again seen with slightly greater displacement. Electronically Signed   By: Miachel Roux M.D.   On: 01/23/2021 07:55     Labs:   Basic Metabolic Panel: Recent Labs  Lab 01/23/21 0222 01/24/21 0532 01/25/21 0925 01/26/21 0540  NA 136 136 135 138  K 4.4 4.3 4.2 4.3  CL 106 106 105 107  CO2 19* 23 22 21*  GLUCOSE 115* 125* 173* 107*  BUN 37* 36* 49* 56*  CREATININE 1.92* 1.65* 1.63* 1.55*  CALCIUM 9.8 9.2 9.1 9.8  MG  --  1.8  --   --    GFR Estimated Creatinine Clearance: 19.7 mL/min (A) (by C-G formula based on SCr of 1.55 mg/dL (H)). Liver Function Tests: Recent Labs  Lab 01/24/21 0532  AST 20  ALT 10  ALKPHOS 57  BILITOT 0.5  PROT 5.7*  ALBUMIN 2.5*   No results for input(s): LIPASE, AMYLASE in the last 168 hours. No results for input(s): AMMONIA in the last 168 hours. Coagulation profile Recent Labs  Lab 01/24/21 0532  INR 1.2    CBC: Recent Labs  Lab 01/23/21 0222 01/24/21 0532 01/26/21 0540  WBC 7.5 5.2 8.9  NEUTROABS 4.5 2.7 5.5  HGB 10.5* 9.1* 10.1*  HCT 32.0* 29.1* 31.9*  MCV 96.1 101.0* 98.8  PLT 347 256 369   Cardiac Enzymes: No results for input(s): CKTOTAL, CKMB, CKMBINDEX, TROPONINI in the last 168 hours. BNP: Invalid input(s): POCBNP CBG: Recent Labs  Lab 01/25/21 1202 01/25/21 1721 01/25/21 2214 01/26/21 0749 01/26/21 1223  GLUCAP 150* 172* 135* 117* 111*   D-Dimer No results for input(s):  DDIMER in the last 72 hours. Hgb A1c No results for input(s): HGBA1C in the last 72 hours. Lipid Profile No results for input(s): CHOL, HDL, LDLCALC, TRIG, CHOLHDL, LDLDIRECT in the last 72 hours. Thyroid function studies No results for input(s): TSH, T4TOTAL, T3FREE, THYROIDAB in the last 72 hours.  Invalid input(s): FREET3 Anemia work up No results for input(s): VITAMINB12, FOLATE, FERRITIN, TIBC, IRON, RETICCTPCT in the last 72 hours. Microbiology No results found for this or any previous visit (from the past 240 hour(s)).   Signed: Marlowe Aschoff Jamey Harman  [facility] 01/26/2021, 12:50 PM

## 2021-01-26 NOTE — Consult Note (Addendum)
   [facility] Inpatient Consult   01/26/2021  Patricia [name] Jul 23, 1932 [id number]   Patient screened for high risk score for unplanned readmission. Chart reviewed to assess for potential [place] Management community service needs. Per review, patient is being recommended for a skilled nursing facility level of care.    No [facility] Care Management follow up needs at this time.   Of note, [facility] Care Management services does not replace or interfere with any services that are arranged by inpatient case management or social work.  [name], MSN, [facility] Liaison Nurse Mobile Phone [phone]  Toll free office [phone]

## 2021-01-26 NOTE — TOC Transition Note (Signed)
Transition of Care [facility]) - CM/SW Discharge Note   Patient Details  Name: Patricia [name] MRN: [id number] Date of Birth: Jul 10, 1932  Transition of Care [facility]) CM/SW Contact:  Lynnell Catalan, RN Phone Number: 01/26/2021, 1:17 PM   Clinical Narrative:    Pt to dc to [facility]. [facility] contacted for transport. Yellow DNR on the chart for transport. RN to call report to [phone].   Final next level of care: Skilled Nursing Facility Barriers to Discharge: Continued Medical Work up   Discharge Plan and Services   Discharge Planning Services: CM Consult                 Readmission Risk Interventions Readmission Risk Prevention Plan 01/25/2021 12/22/2020  Transportation Screening Complete Complete  Medication Review [employer]) Complete Referral to Pharmacy  PCP or Specialist appointment within 3-5 days of discharge Complete Complete  HRI or Home Care Consult Complete Complete  SW Recovery Care/Counseling Consult Complete Complete  Palliative Care Screening Not Applicable Complete  Skilled Nursing Facility Complete Complete  Some recent data might be hidden

## 2021-01-30 DIAGNOSIS — E1159 Type 2 diabetes mellitus with other circulatory complications: Secondary | ICD-10-CM | POA: Diagnosis not present

## 2021-01-30 DIAGNOSIS — R262 Difficulty in walking, not elsewhere classified: Secondary | ICD-10-CM | POA: Diagnosis not present

## 2021-01-30 DIAGNOSIS — Z96649 Presence of unspecified artificial hip joint: Secondary | ICD-10-CM | POA: Diagnosis not present

## 2021-01-30 DIAGNOSIS — M978XXA Periprosthetic fracture around other internal prosthetic joint, initial encounter: Secondary | ICD-10-CM | POA: Diagnosis not present

## 2021-02-01 ENCOUNTER — Other Ambulatory Visit: Payer: Self-pay | Admitting: Adult Health

## 2021-02-01 DIAGNOSIS — I10 Essential (primary) hypertension: Secondary | ICD-10-CM

## 2021-02-01 DIAGNOSIS — K5901 Slow transit constipation: Secondary | ICD-10-CM

## 2021-02-01 DIAGNOSIS — K219 Gastro-esophageal reflux disease without esophagitis: Secondary | ICD-10-CM

## 2021-02-01 DIAGNOSIS — E034 Atrophy of thyroid (acquired): Secondary | ICD-10-CM

## 2021-02-01 DIAGNOSIS — I825Y2 Chronic embolism and thrombosis of unspecified deep veins of left proximal lower extremity: Secondary | ICD-10-CM

## 2021-02-01 DIAGNOSIS — Z940 Kidney transplant status: Secondary | ICD-10-CM

## 2021-02-01 DIAGNOSIS — E782 Mixed hyperlipidemia: Secondary | ICD-10-CM

## 2021-02-01 DIAGNOSIS — J42 Unspecified chronic bronchitis: Secondary | ICD-10-CM

## 2021-02-02 DIAGNOSIS — M810 Age-related osteoporosis without current pathological fracture: Secondary | ICD-10-CM | POA: Diagnosis not present

## 2021-02-02 DIAGNOSIS — J449 Chronic obstructive pulmonary disease, unspecified: Secondary | ICD-10-CM | POA: Diagnosis not present

## 2021-02-02 DIAGNOSIS — F324 Major depressive disorder, single episode, in partial remission: Secondary | ICD-10-CM | POA: Diagnosis not present

## 2021-02-02 DIAGNOSIS — I129 Hypertensive chronic kidney disease with stage 1 through stage 4 chronic kidney disease, or unspecified chronic kidney disease: Secondary | ICD-10-CM | POA: Diagnosis not present

## 2021-02-02 DIAGNOSIS — E7800 Pure hypercholesterolemia, unspecified: Secondary | ICD-10-CM | POA: Diagnosis not present

## 2021-02-02 DIAGNOSIS — E039 Hypothyroidism, unspecified: Secondary | ICD-10-CM | POA: Diagnosis not present

## 2021-02-02 DIAGNOSIS — H35033 Hypertensive retinopathy, bilateral: Secondary | ICD-10-CM | POA: Diagnosis not present

## 2021-02-02 DIAGNOSIS — N184 Chronic kidney disease, stage 4 (severe): Secondary | ICD-10-CM | POA: Diagnosis not present

## 2021-02-09 DIAGNOSIS — R262 Difficulty in walking, not elsewhere classified: Secondary | ICD-10-CM | POA: Diagnosis not present

## 2021-02-09 DIAGNOSIS — M978XXA Periprosthetic fracture around other internal prosthetic joint, initial encounter: Secondary | ICD-10-CM | POA: Diagnosis not present

## 2021-02-09 DIAGNOSIS — Z96649 Presence of unspecified artificial hip joint: Secondary | ICD-10-CM | POA: Diagnosis not present

## 2021-02-09 DIAGNOSIS — N184 Chronic kidney disease, stage 4 (severe): Secondary | ICD-10-CM | POA: Diagnosis not present

## 2021-02-13 DIAGNOSIS — I5030 Unspecified diastolic (congestive) heart failure: Secondary | ICD-10-CM | POA: Diagnosis not present

## 2021-02-13 DIAGNOSIS — R262 Difficulty in walking, not elsewhere classified: Secondary | ICD-10-CM | POA: Diagnosis not present

## 2021-02-13 DIAGNOSIS — Z96649 Presence of unspecified artificial hip joint: Secondary | ICD-10-CM | POA: Diagnosis not present

## 2021-02-13 DIAGNOSIS — M978XXA Periprosthetic fracture around other internal prosthetic joint, initial encounter: Secondary | ICD-10-CM | POA: Diagnosis not present

## 2021-02-14 ENCOUNTER — Telehealth: Payer: Self-pay

## 2021-02-14 NOTE — Telephone Encounter (Signed)
Spoke with patient's husband [name] and scheduled an in-person Palliative Consult for 03/08/21 @ 9AM  COVID screening was negative. No pets in home. Patient lives with husband and son.  Consent obtained; updated Outlook/Netsmart/Team List and Epic.  Family is aware they may be receiving a call from NP the day before or day of to confirm appointment.

## 2021-02-14 NOTE — Telephone Encounter (Signed)
(  2:58p) SW provided follow-up call to patient's husband- Patricia [name] to provide resources and support to him per request of NP-L. Rivers. Howard requested resource information for in-home caregivers for his wife. SW provided four: [employer], [employer], [employer] and [facility] [employer] [facility]. He verbalized appreciation for the resources. SW encouraged him to call again for any additional needs or concerns.

## 2021-02-15 DIAGNOSIS — D631 Anemia in chronic kidney disease: Secondary | ICD-10-CM | POA: Diagnosis not present

## 2021-02-15 DIAGNOSIS — M9701XD Periprosthetic fracture around internal prosthetic right hip joint, subsequent encounter: Secondary | ICD-10-CM | POA: Diagnosis not present

## 2021-02-15 DIAGNOSIS — J449 Chronic obstructive pulmonary disease, unspecified: Secondary | ICD-10-CM | POA: Diagnosis not present

## 2021-02-15 DIAGNOSIS — I152 Hypertension secondary to endocrine disorders: Secondary | ICD-10-CM | POA: Diagnosis not present

## 2021-02-15 DIAGNOSIS — I5032 Chronic diastolic (congestive) heart failure: Secondary | ICD-10-CM | POA: Diagnosis not present

## 2021-02-15 DIAGNOSIS — E1159 Type 2 diabetes mellitus with other circulatory complications: Secondary | ICD-10-CM | POA: Diagnosis not present

## 2021-02-15 DIAGNOSIS — N184 Chronic kidney disease, stage 4 (severe): Secondary | ICD-10-CM | POA: Diagnosis not present

## 2021-02-15 DIAGNOSIS — S72001D Fracture of unspecified part of neck of right femur, subsequent encounter for closed fracture with routine healing: Secondary | ICD-10-CM | POA: Diagnosis not present

## 2021-02-15 DIAGNOSIS — E1122 Type 2 diabetes mellitus with diabetic chronic kidney disease: Secondary | ICD-10-CM | POA: Diagnosis not present

## 2021-02-16 DIAGNOSIS — E1159 Type 2 diabetes mellitus with other circulatory complications: Secondary | ICD-10-CM | POA: Diagnosis not present

## 2021-02-16 DIAGNOSIS — M9701XD Periprosthetic fracture around internal prosthetic right hip joint, subsequent encounter: Secondary | ICD-10-CM | POA: Diagnosis not present

## 2021-02-16 DIAGNOSIS — I152 Hypertension secondary to endocrine disorders: Secondary | ICD-10-CM | POA: Diagnosis not present

## 2021-02-16 DIAGNOSIS — J449 Chronic obstructive pulmonary disease, unspecified: Secondary | ICD-10-CM | POA: Diagnosis not present

## 2021-02-16 DIAGNOSIS — N184 Chronic kidney disease, stage 4 (severe): Secondary | ICD-10-CM | POA: Diagnosis not present

## 2021-02-16 DIAGNOSIS — I5032 Chronic diastolic (congestive) heart failure: Secondary | ICD-10-CM | POA: Diagnosis not present

## 2021-02-16 DIAGNOSIS — E1122 Type 2 diabetes mellitus with diabetic chronic kidney disease: Secondary | ICD-10-CM | POA: Diagnosis not present

## 2021-02-16 DIAGNOSIS — D631 Anemia in chronic kidney disease: Secondary | ICD-10-CM | POA: Diagnosis not present

## 2021-02-16 DIAGNOSIS — S72001D Fracture of unspecified part of neck of right femur, subsequent encounter for closed fracture with routine healing: Secondary | ICD-10-CM | POA: Diagnosis not present

## 2021-02-20 DIAGNOSIS — M9701XD Periprosthetic fracture around internal prosthetic right hip joint, subsequent encounter: Secondary | ICD-10-CM | POA: Diagnosis not present

## 2021-02-25 DIAGNOSIS — S72001D Fracture of unspecified part of neck of right femur, subsequent encounter for closed fracture with routine healing: Secondary | ICD-10-CM | POA: Diagnosis not present

## 2021-02-25 DIAGNOSIS — E1122 Type 2 diabetes mellitus with diabetic chronic kidney disease: Secondary | ICD-10-CM | POA: Diagnosis not present

## 2021-02-25 DIAGNOSIS — I5032 Chronic diastolic (congestive) heart failure: Secondary | ICD-10-CM | POA: Diagnosis not present

## 2021-02-25 DIAGNOSIS — I152 Hypertension secondary to endocrine disorders: Secondary | ICD-10-CM | POA: Diagnosis not present

## 2021-02-25 DIAGNOSIS — D631 Anemia in chronic kidney disease: Secondary | ICD-10-CM | POA: Diagnosis not present

## 2021-02-25 DIAGNOSIS — J449 Chronic obstructive pulmonary disease, unspecified: Secondary | ICD-10-CM | POA: Diagnosis not present

## 2021-02-25 DIAGNOSIS — E1159 Type 2 diabetes mellitus with other circulatory complications: Secondary | ICD-10-CM | POA: Diagnosis not present

## 2021-02-25 DIAGNOSIS — N184 Chronic kidney disease, stage 4 (severe): Secondary | ICD-10-CM | POA: Diagnosis not present

## 2021-02-25 DIAGNOSIS — M9701XD Periprosthetic fracture around internal prosthetic right hip joint, subsequent encounter: Secondary | ICD-10-CM | POA: Diagnosis not present

## 2021-02-28 DIAGNOSIS — I5032 Chronic diastolic (congestive) heart failure: Secondary | ICD-10-CM | POA: Diagnosis not present

## 2021-02-28 DIAGNOSIS — E1159 Type 2 diabetes mellitus with other circulatory complications: Secondary | ICD-10-CM | POA: Diagnosis not present

## 2021-02-28 DIAGNOSIS — D631 Anemia in chronic kidney disease: Secondary | ICD-10-CM | POA: Diagnosis not present

## 2021-02-28 DIAGNOSIS — N184 Chronic kidney disease, stage 4 (severe): Secondary | ICD-10-CM | POA: Diagnosis not present

## 2021-02-28 DIAGNOSIS — E1122 Type 2 diabetes mellitus with diabetic chronic kidney disease: Secondary | ICD-10-CM | POA: Diagnosis not present

## 2021-02-28 DIAGNOSIS — J449 Chronic obstructive pulmonary disease, unspecified: Secondary | ICD-10-CM | POA: Diagnosis not present

## 2021-02-28 DIAGNOSIS — S72001D Fracture of unspecified part of neck of right femur, subsequent encounter for closed fracture with routine healing: Secondary | ICD-10-CM | POA: Diagnosis not present

## 2021-02-28 DIAGNOSIS — I152 Hypertension secondary to endocrine disorders: Secondary | ICD-10-CM | POA: Diagnosis not present

## 2021-02-28 DIAGNOSIS — M9701XD Periprosthetic fracture around internal prosthetic right hip joint, subsequent encounter: Secondary | ICD-10-CM | POA: Diagnosis not present

## 2021-03-02 DIAGNOSIS — M9701XD Periprosthetic fracture around internal prosthetic right hip joint, subsequent encounter: Secondary | ICD-10-CM | POA: Diagnosis not present

## 2021-03-02 DIAGNOSIS — I5032 Chronic diastolic (congestive) heart failure: Secondary | ICD-10-CM | POA: Diagnosis not present

## 2021-03-02 DIAGNOSIS — J449 Chronic obstructive pulmonary disease, unspecified: Secondary | ICD-10-CM | POA: Diagnosis not present

## 2021-03-02 DIAGNOSIS — S72001D Fracture of unspecified part of neck of right femur, subsequent encounter for closed fracture with routine healing: Secondary | ICD-10-CM | POA: Diagnosis not present

## 2021-03-02 DIAGNOSIS — D631 Anemia in chronic kidney disease: Secondary | ICD-10-CM | POA: Diagnosis not present

## 2021-03-02 DIAGNOSIS — N184 Chronic kidney disease, stage 4 (severe): Secondary | ICD-10-CM | POA: Diagnosis not present

## 2021-03-02 DIAGNOSIS — E1122 Type 2 diabetes mellitus with diabetic chronic kidney disease: Secondary | ICD-10-CM | POA: Diagnosis not present

## 2021-03-02 DIAGNOSIS — I152 Hypertension secondary to endocrine disorders: Secondary | ICD-10-CM | POA: Diagnosis not present

## 2021-03-02 DIAGNOSIS — E1159 Type 2 diabetes mellitus with other circulatory complications: Secondary | ICD-10-CM | POA: Diagnosis not present

## 2021-03-06 DIAGNOSIS — I129 Hypertensive chronic kidney disease with stage 1 through stage 4 chronic kidney disease, or unspecified chronic kidney disease: Secondary | ICD-10-CM | POA: Diagnosis not present

## 2021-03-06 DIAGNOSIS — H35033 Hypertensive retinopathy, bilateral: Secondary | ICD-10-CM | POA: Diagnosis not present

## 2021-03-06 DIAGNOSIS — J449 Chronic obstructive pulmonary disease, unspecified: Secondary | ICD-10-CM | POA: Diagnosis not present

## 2021-03-06 DIAGNOSIS — M810 Age-related osteoporosis without current pathological fracture: Secondary | ICD-10-CM | POA: Diagnosis not present

## 2021-03-06 DIAGNOSIS — F324 Major depressive disorder, single episode, in partial remission: Secondary | ICD-10-CM | POA: Diagnosis not present

## 2021-03-06 DIAGNOSIS — E7800 Pure hypercholesterolemia, unspecified: Secondary | ICD-10-CM | POA: Diagnosis not present

## 2021-03-06 DIAGNOSIS — N184 Chronic kidney disease, stage 4 (severe): Secondary | ICD-10-CM | POA: Diagnosis not present

## 2021-03-06 DIAGNOSIS — E039 Hypothyroidism, unspecified: Secondary | ICD-10-CM | POA: Diagnosis not present

## 2021-03-08 ENCOUNTER — Other Ambulatory Visit: Payer: Self-pay | Admitting: Student

## 2021-03-08 ENCOUNTER — Other Ambulatory Visit: Payer: Self-pay

## 2021-03-08 ENCOUNTER — Encounter: Payer: Self-pay | Admitting: Student

## 2021-03-08 NOTE — Progress Notes (Signed)
Palliative NP arrived for visit. Patient has been admitted to [facility] on 03/03/21 per husband and private caregiver. Will discharge  From Palliative services.

## 2021-03-27 ENCOUNTER — Other Ambulatory Visit: Payer: Self-pay | Admitting: Adult Health

## 2021-04-24 DIAGNOSIS — M9701XD Periprosthetic fracture around internal prosthetic right hip joint, subsequent encounter: Secondary | ICD-10-CM | POA: Diagnosis not present

## 2021-05-09 DIAGNOSIS — D045 Carcinoma in situ of skin of trunk: Secondary | ICD-10-CM | POA: Diagnosis not present

## 2021-05-09 DIAGNOSIS — C44529 Squamous cell carcinoma of skin of other part of trunk: Secondary | ICD-10-CM | POA: Diagnosis not present

## 2021-05-09 DIAGNOSIS — D485 Neoplasm of uncertain behavior of skin: Secondary | ICD-10-CM | POA: Diagnosis not present

## 2021-05-09 DIAGNOSIS — Z85828 Personal history of other malignant neoplasm of skin: Secondary | ICD-10-CM | POA: Diagnosis not present

## 2021-05-17 DIAGNOSIS — Z85828 Personal history of other malignant neoplasm of skin: Secondary | ICD-10-CM | POA: Diagnosis not present

## 2021-05-17 DIAGNOSIS — C44529 Squamous cell carcinoma of skin of other part of trunk: Secondary | ICD-10-CM | POA: Diagnosis not present

## 2021-05-24 DIAGNOSIS — D044 Carcinoma in situ of skin of scalp and neck: Secondary | ICD-10-CM | POA: Diagnosis not present

## 2021-05-24 DIAGNOSIS — L928 Other granulomatous disorders of the skin and subcutaneous tissue: Secondary | ICD-10-CM | POA: Diagnosis not present

## 2021-05-24 DIAGNOSIS — D485 Neoplasm of uncertain behavior of skin: Secondary | ICD-10-CM | POA: Diagnosis not present

## 2021-05-26 ENCOUNTER — Other Ambulatory Visit: Payer: Self-pay | Admitting: Adult Health

## 2021-06-21 DIAGNOSIS — Z85828 Personal history of other malignant neoplasm of skin: Secondary | ICD-10-CM | POA: Diagnosis not present

## 2021-06-21 DIAGNOSIS — C4442 Squamous cell carcinoma of skin of scalp and neck: Secondary | ICD-10-CM | POA: Diagnosis not present

## 2021-06-25 DIAGNOSIS — J439 Emphysema, unspecified: Secondary | ICD-10-CM | POA: Diagnosis not present

## 2021-06-25 DIAGNOSIS — E039 Hypothyroidism, unspecified: Secondary | ICD-10-CM | POA: Diagnosis not present

## 2021-06-25 DIAGNOSIS — E119 Type 2 diabetes mellitus without complications: Secondary | ICD-10-CM | POA: Diagnosis not present

## 2021-06-25 DIAGNOSIS — K5900 Constipation, unspecified: Secondary | ICD-10-CM | POA: Diagnosis not present

## 2021-06-25 DIAGNOSIS — K219 Gastro-esophageal reflux disease without esophagitis: Secondary | ICD-10-CM | POA: Diagnosis not present

## 2021-06-25 DIAGNOSIS — J309 Allergic rhinitis, unspecified: Secondary | ICD-10-CM | POA: Diagnosis not present

## 2021-06-25 DIAGNOSIS — E785 Hyperlipidemia, unspecified: Secondary | ICD-10-CM | POA: Diagnosis not present

## 2021-06-25 DIAGNOSIS — I10 Essential (primary) hypertension: Secondary | ICD-10-CM | POA: Diagnosis not present

## 2021-06-25 DIAGNOSIS — N3941 Urge incontinence: Secondary | ICD-10-CM | POA: Diagnosis not present

## 2021-06-28 DIAGNOSIS — D485 Neoplasm of uncertain behavior of skin: Secondary | ICD-10-CM | POA: Diagnosis not present

## 2021-06-28 DIAGNOSIS — C44329 Squamous cell carcinoma of skin of other parts of face: Secondary | ICD-10-CM | POA: Diagnosis not present

## 2021-06-28 DIAGNOSIS — L570 Actinic keratosis: Secondary | ICD-10-CM | POA: Diagnosis not present

## 2021-06-28 DIAGNOSIS — C44529 Squamous cell carcinoma of skin of other part of trunk: Secondary | ICD-10-CM | POA: Diagnosis not present

## 2021-06-28 DIAGNOSIS — C44629 Squamous cell carcinoma of skin of left upper limb, including shoulder: Secondary | ICD-10-CM | POA: Diagnosis not present

## 2021-06-28 DIAGNOSIS — Z85828 Personal history of other malignant neoplasm of skin: Secondary | ICD-10-CM | POA: Diagnosis not present

## 2021-07-31 DIAGNOSIS — R251 Tremor, unspecified: Secondary | ICD-10-CM | POA: Diagnosis not present

## 2021-07-31 DIAGNOSIS — H35033 Hypertensive retinopathy, bilateral: Secondary | ICD-10-CM | POA: Diagnosis not present

## 2021-07-31 DIAGNOSIS — R531 Weakness: Secondary | ICD-10-CM | POA: Diagnosis not present

## 2021-07-31 DIAGNOSIS — Z79899 Other long term (current) drug therapy: Secondary | ICD-10-CM | POA: Diagnosis not present

## 2021-09-13 IMAGING — CR DG FEMUR 2+V*R*
4 series · 4 of 4 positions shown · non-contrast
Comparison: Right hip radiographs 02/08/2016

CLINICAL DATA: Leg pain status post fall.

EXAM:
RIGHT FEMUR 2 VIEWS

[femur ap (1 of 2)]
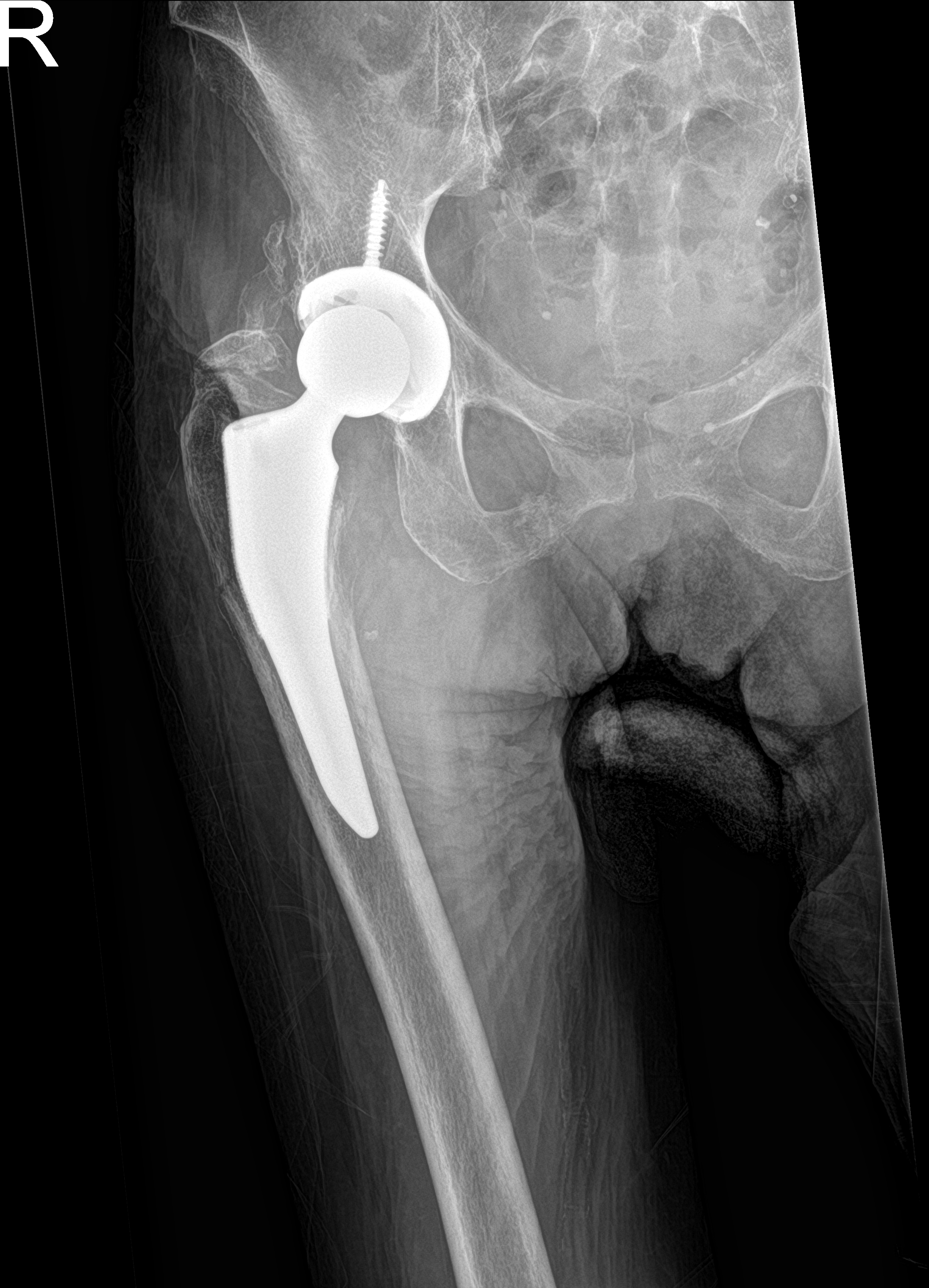

[femur ap (2 of 2)]
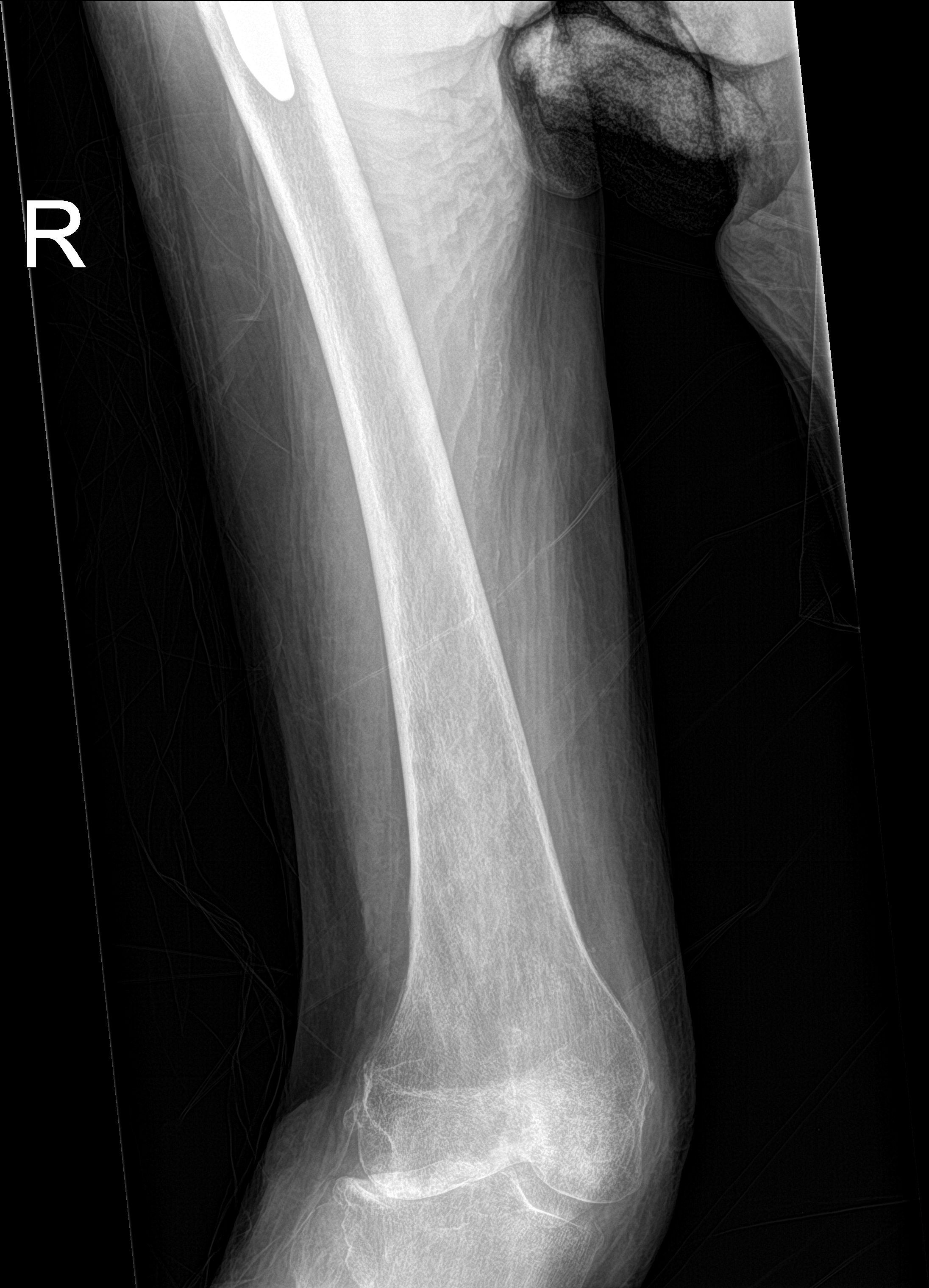

[femur lat (1 of 2)]
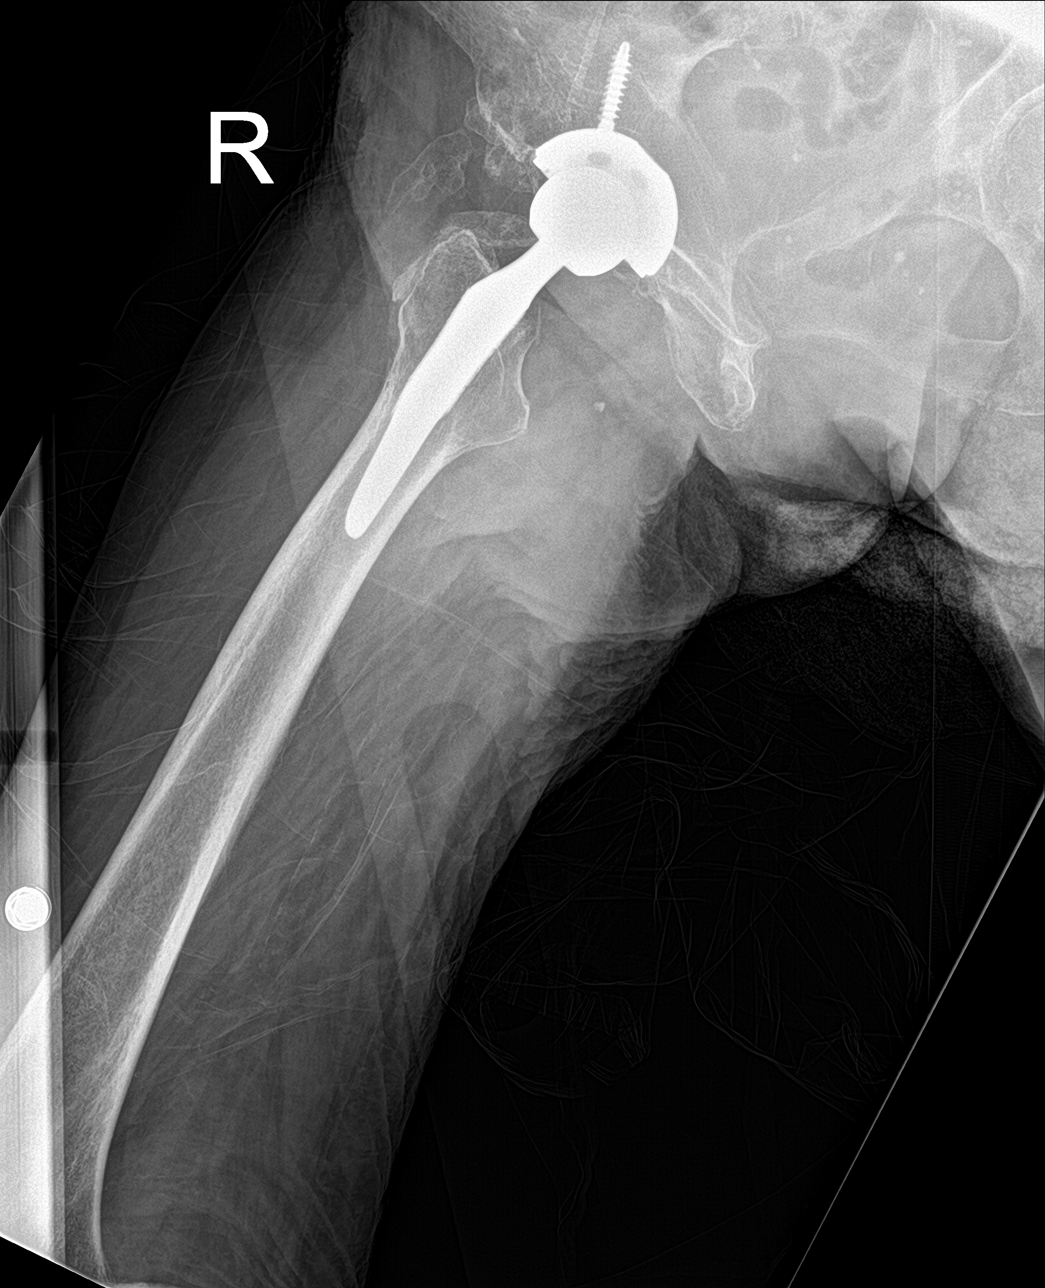

[femur lat (2 of 2)]
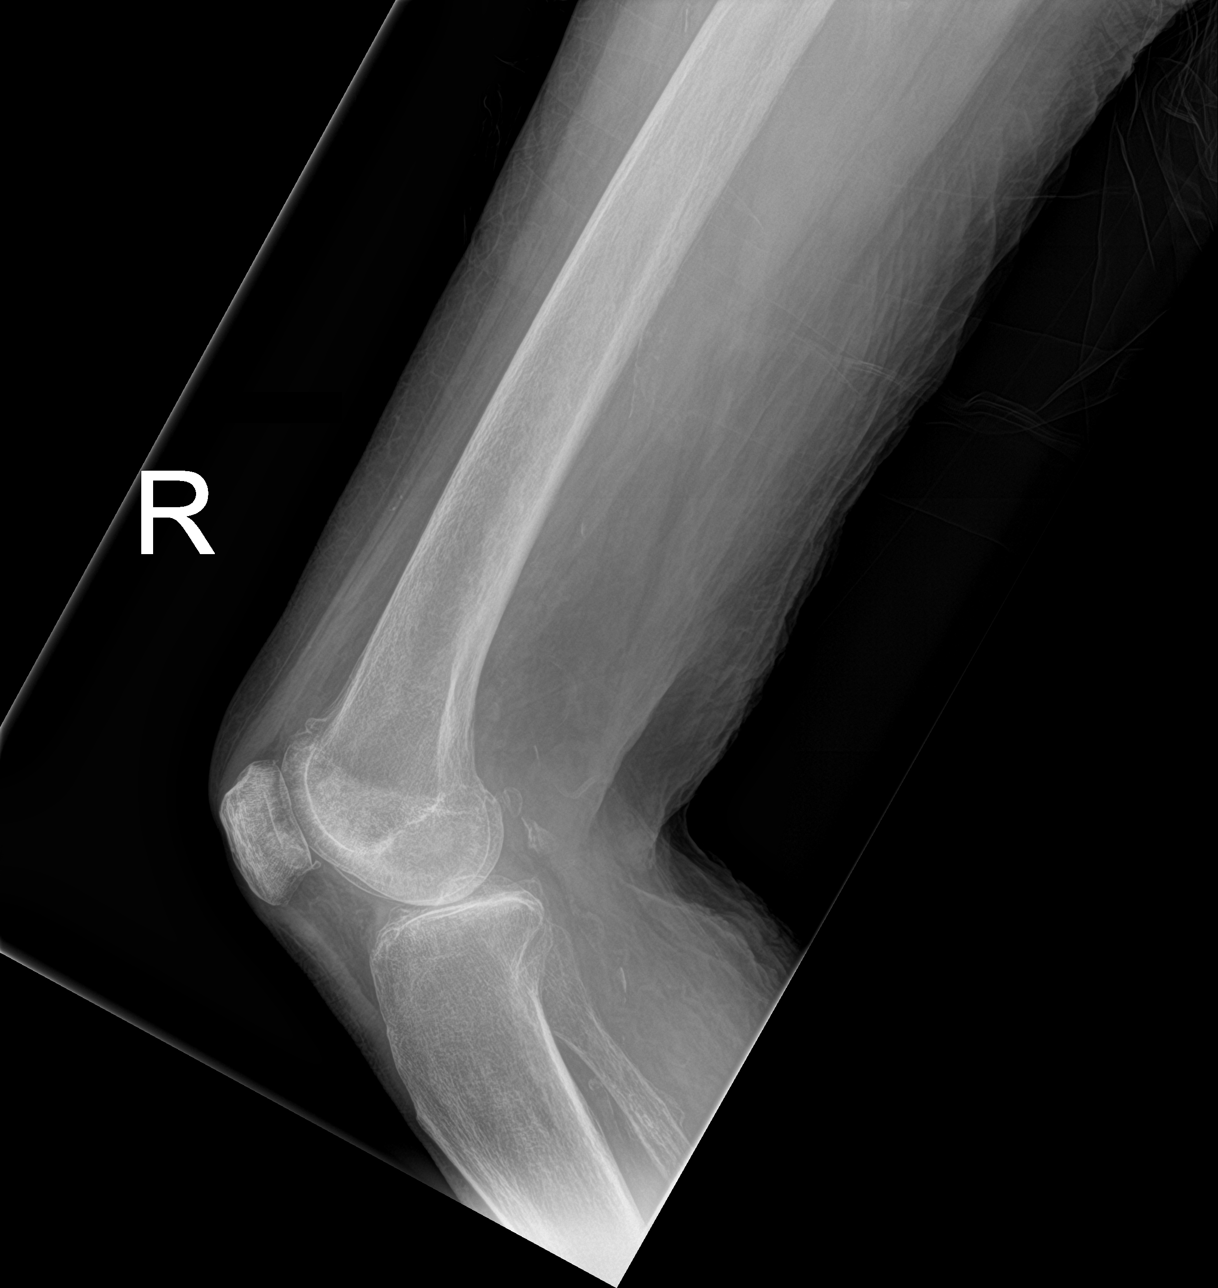

[4 of 4 positions shown; findings below may reference images not displayed]

FINDINGS: Transverse lucency extending to the subtrochanteric region of the
right total hip prosthesis is suspicious for a periprosthetic
fracture.

Diffuse osteopenia. Heterotopic calcifications seen superior and
lateral to the right hip joint.

Atherosclerotic changes seen throughout visualized arterial
segments.
IMPRESSION: Transverse lucency in the lateral proximal femoral diaphysis
extending to the prosthesis is suspicious for nondisplaced fracture.
Further evaluation with CT would be beneficial.

## 2021-09-20 IMAGING — DX DG HIP (WITH OR WITHOUT PELVIS) 1V PORT*R*
2 series · 2 of 2 positions shown · non-contrast
Comparison: Femur radiographs 01/16/2021

CLINICAL DATA: Closed communicated intertrochanteric fracture of
the right proximal femur.

EXAM:
DG HIP (WITH OR WITHOUT PELVIS) 1V PORT RIGHT

[pelvis ap]
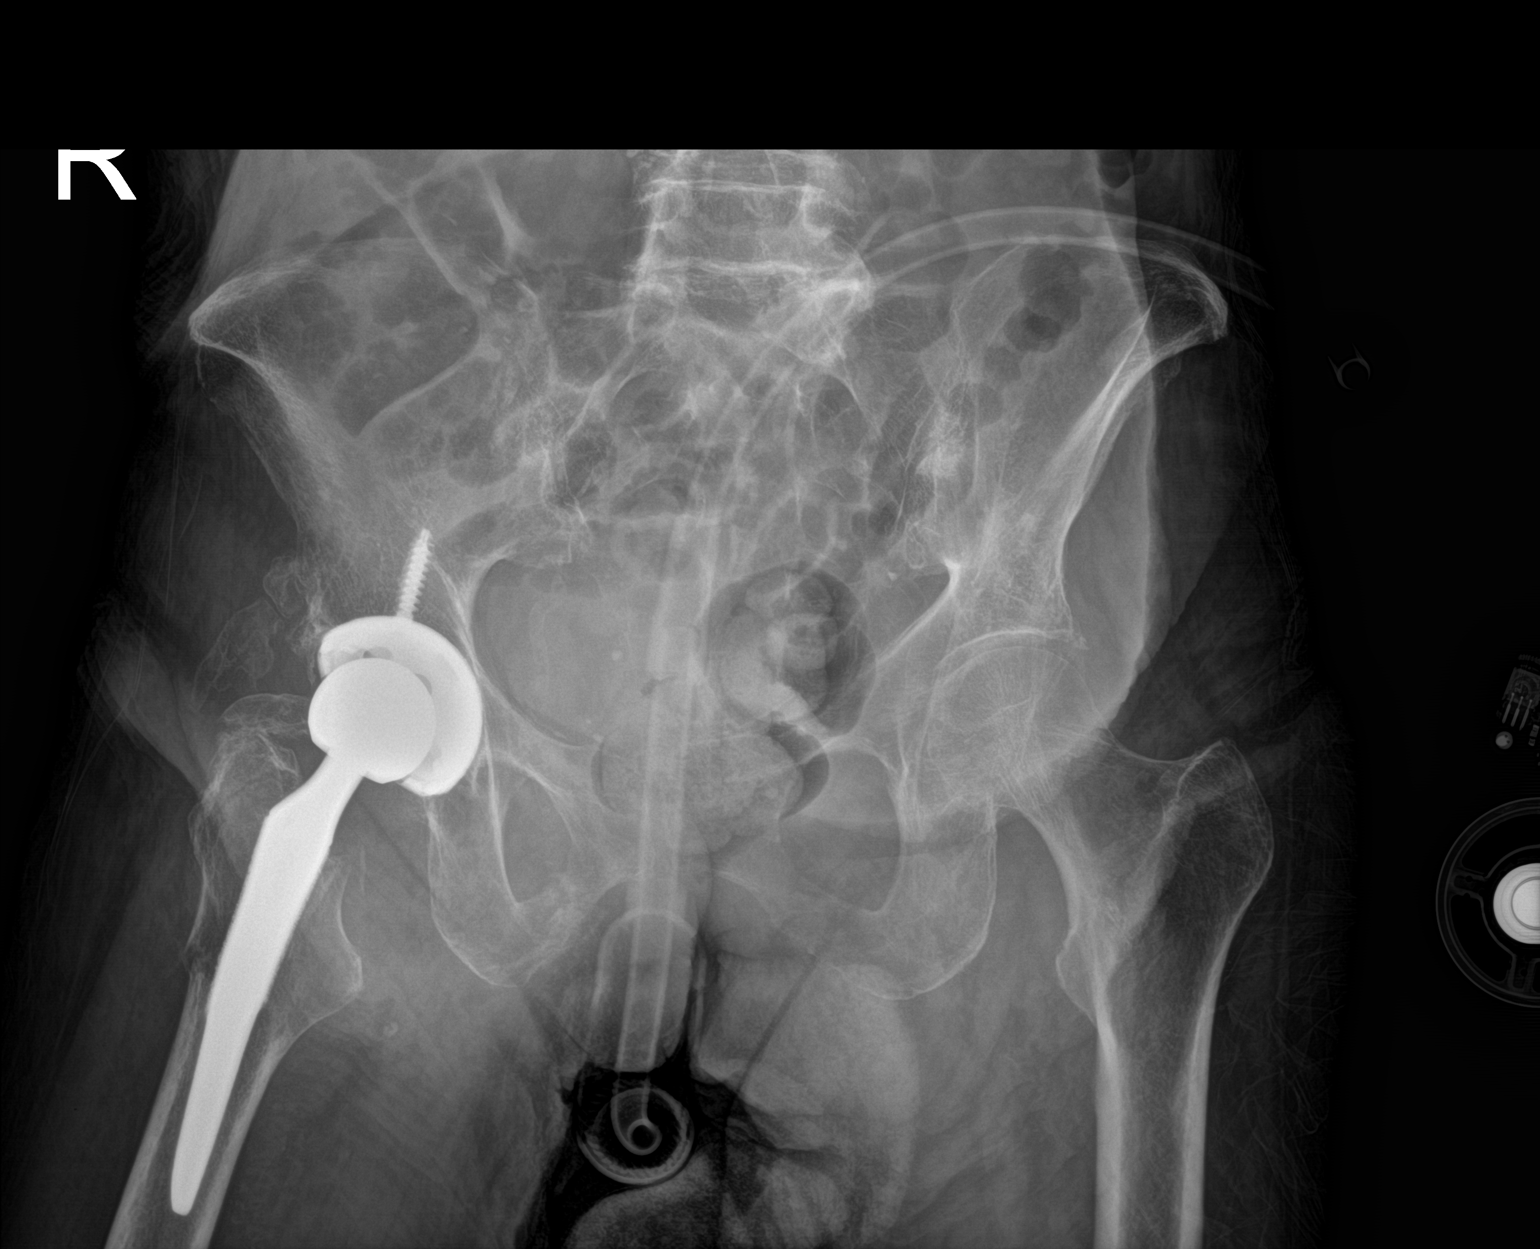

[hip ap]
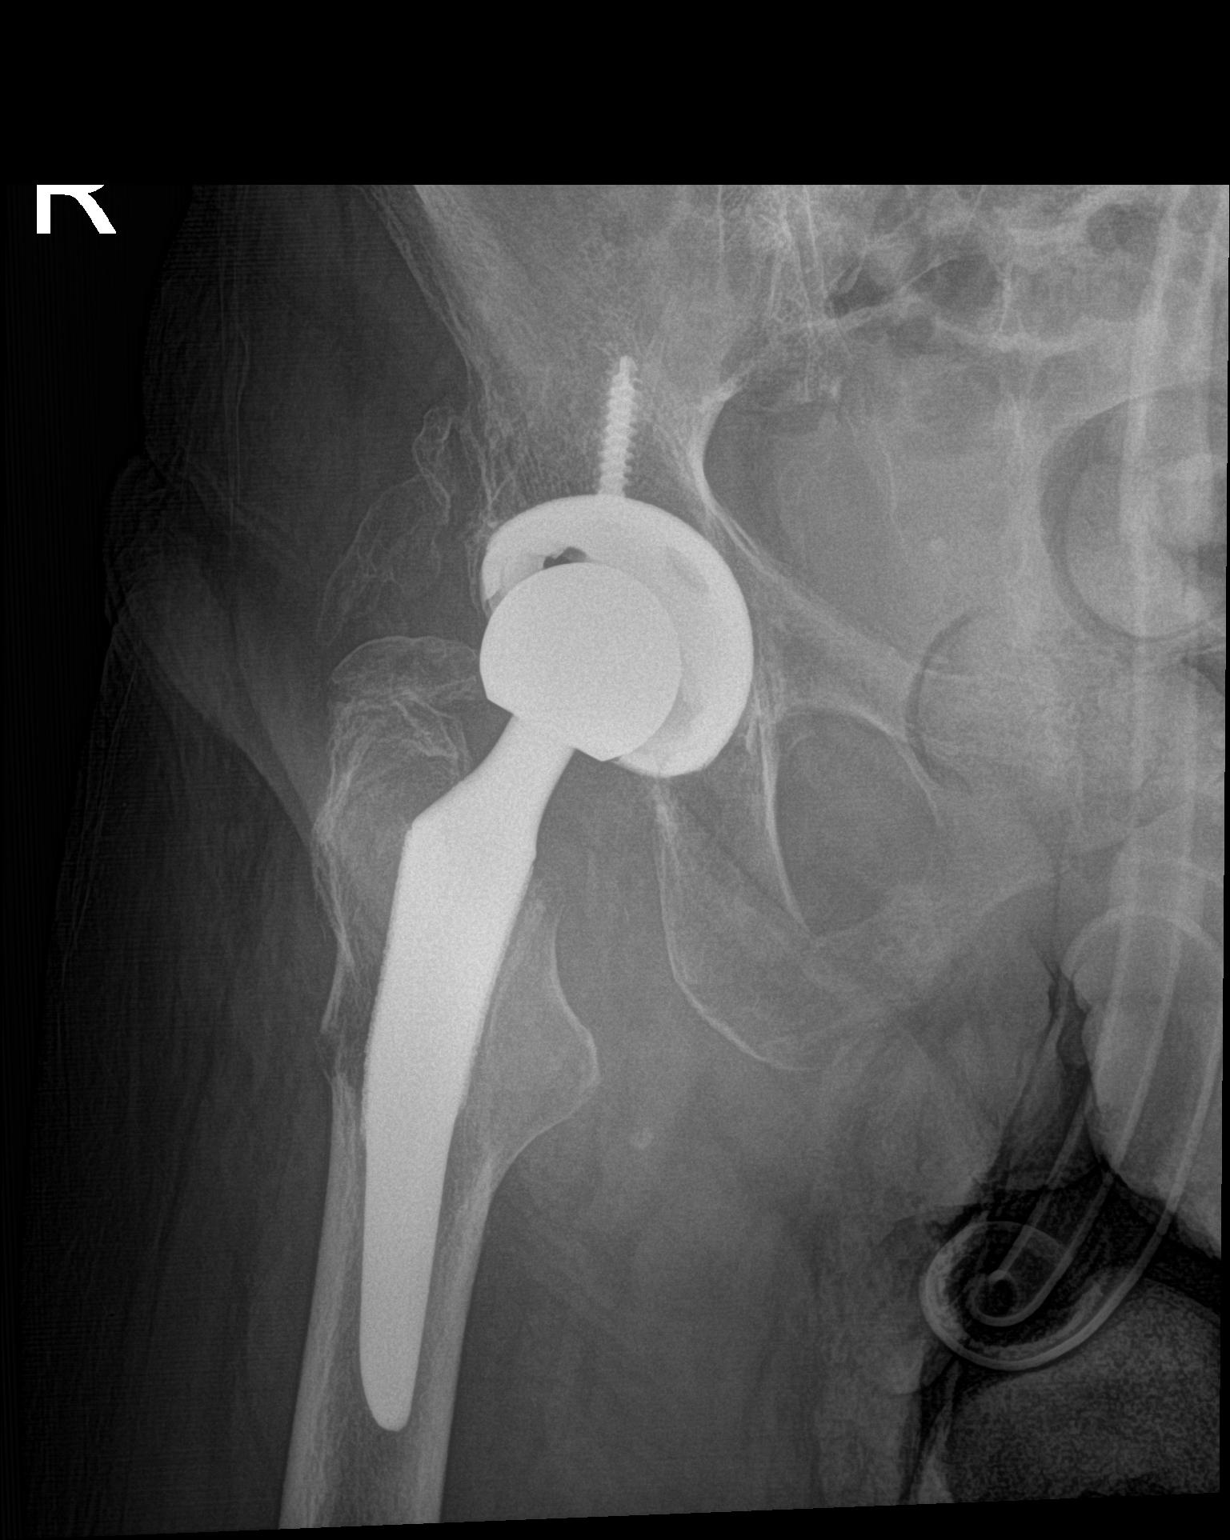

[2 of 2 positions shown; findings below may reference images not displayed]

FINDINGS: Periprosthetic right proximal femur fracture again seen. There is
slightly greater displacement of the fracture fragments compared to
the prior examination.

Diffuse osteopenia.
IMPRESSION: Periprosthetic right proximal femur fracture again seen with
slightly greater displacement.

## 2021-09-25 DIAGNOSIS — C44622 Squamous cell carcinoma of skin of right upper limb, including shoulder: Secondary | ICD-10-CM | POA: Diagnosis not present

## 2021-09-25 DIAGNOSIS — Z85828 Personal history of other malignant neoplasm of skin: Secondary | ICD-10-CM | POA: Diagnosis not present

## 2021-09-25 DIAGNOSIS — C44729 Squamous cell carcinoma of skin of left lower limb, including hip: Secondary | ICD-10-CM | POA: Diagnosis not present

## 2021-09-25 DIAGNOSIS — L820 Inflamed seborrheic keratosis: Secondary | ICD-10-CM | POA: Diagnosis not present

## 2021-09-25 DIAGNOSIS — C44329 Squamous cell carcinoma of skin of other parts of face: Secondary | ICD-10-CM | POA: Diagnosis not present

## 2021-09-25 DIAGNOSIS — D485 Neoplasm of uncertain behavior of skin: Secondary | ICD-10-CM | POA: Diagnosis not present

## 2021-11-23 ENCOUNTER — Other Ambulatory Visit: Payer: Self-pay

## 2021-11-23 ENCOUNTER — Other Ambulatory Visit: Payer: Self-pay | Admitting: Internal Medicine

## 2021-11-23 ENCOUNTER — Ambulatory Visit
Admission: RE | Admit: 2021-11-23 | Discharge: 2021-11-23 | Disposition: A | Discharge status: Home / Self Care | Payer: Medicare PPO | Source: Ambulatory Visit | Attending: Internal Medicine | Admitting: Internal Medicine

## 2021-11-23 DIAGNOSIS — J449 Chronic obstructive pulmonary disease, unspecified: Secondary | ICD-10-CM | POA: Diagnosis not present

## 2021-11-23 DIAGNOSIS — R0602 Shortness of breath: Secondary | ICD-10-CM

## 2021-11-23 DIAGNOSIS — Z03818 Encounter for observation for suspected exposure to other biological agents ruled out: Secondary | ICD-10-CM | POA: Diagnosis not present

## 2021-11-23 DIAGNOSIS — R0981 Nasal congestion: Secondary | ICD-10-CM | POA: Diagnosis not present

## 2021-11-29 DIAGNOSIS — C44329 Squamous cell carcinoma of skin of other parts of face: Secondary | ICD-10-CM | POA: Diagnosis not present

## 2021-11-29 DIAGNOSIS — C44529 Squamous cell carcinoma of skin of other part of trunk: Secondary | ICD-10-CM | POA: Diagnosis not present

## 2021-12-14 DIAGNOSIS — H524 Presbyopia: Secondary | ICD-10-CM | POA: Diagnosis not present

## 2021-12-14 DIAGNOSIS — D320 Benign neoplasm of cerebral meninges: Secondary | ICD-10-CM | POA: Diagnosis not present

## 2021-12-14 DIAGNOSIS — H40013 Open angle with borderline findings, low risk, bilateral: Secondary | ICD-10-CM | POA: Diagnosis not present

## 2021-12-14 DIAGNOSIS — H25813 Combined forms of age-related cataract, bilateral: Secondary | ICD-10-CM | POA: Diagnosis not present

## 2022-03-15 DEATH — deceased
